# Patient Record
Sex: Female | Born: 1995 | Race: White | Hispanic: No | Marital: Married | Smoking: Current every day smoker
Health system: Southern US, Community
[De-identification: ages and names within clinical notes are randomized; demographics above are authoritative.]

## PROBLEM LIST (undated history)

## (undated) ENCOUNTER — Inpatient Hospital Stay (HOSPITAL_COMMUNITY): Payer: Self-pay

## (undated) ENCOUNTER — Ambulatory Visit: Discharge status: Absent without leave | Payer: Self-pay

## (undated) DIAGNOSIS — R109 Unspecified abdominal pain: Secondary | ICD-10-CM

## (undated) DIAGNOSIS — M542 Cervicalgia: Secondary | ICD-10-CM

## (undated) DIAGNOSIS — F418 Other specified anxiety disorders: Secondary | ICD-10-CM

## (undated) DIAGNOSIS — R1110 Vomiting, unspecified: Secondary | ICD-10-CM

## (undated) DIAGNOSIS — H9191 Unspecified hearing loss, right ear: Secondary | ICD-10-CM

## (undated) DIAGNOSIS — F99 Mental disorder, not otherwise specified: Secondary | ICD-10-CM

## (undated) DIAGNOSIS — F419 Anxiety disorder, unspecified: Secondary | ICD-10-CM

## (undated) DIAGNOSIS — M1990 Unspecified osteoarthritis, unspecified site: Secondary | ICD-10-CM

## (undated) DIAGNOSIS — R112 Nausea with vomiting, unspecified: Secondary | ICD-10-CM

## (undated) DIAGNOSIS — R45851 Suicidal ideations: Secondary | ICD-10-CM

## (undated) DIAGNOSIS — D649 Anemia, unspecified: Secondary | ICD-10-CM

## (undated) DIAGNOSIS — F5081 Binge eating disorder: Secondary | ICD-10-CM

## (undated) DIAGNOSIS — F329 Major depressive disorder, single episode, unspecified: Secondary | ICD-10-CM

## (undated) DIAGNOSIS — J45909 Unspecified asthma, uncomplicated: Secondary | ICD-10-CM

## (undated) DIAGNOSIS — O26619 Liver and biliary tract disorders in pregnancy, unspecified trimester: Secondary | ICD-10-CM

## (undated) DIAGNOSIS — T8859XA Other complications of anesthesia, initial encounter: Secondary | ICD-10-CM

## (undated) DIAGNOSIS — N390 Urinary tract infection, site not specified: Secondary | ICD-10-CM

## (undated) DIAGNOSIS — T4145XA Adverse effect of unspecified anesthetic, initial encounter: Secondary | ICD-10-CM

## (undated) DIAGNOSIS — F988 Other specified behavioral and emotional disorders with onset usually occurring in childhood and adolescence: Secondary | ICD-10-CM

## (undated) DIAGNOSIS — O26649 Intrahepatic cholestasis of pregnancy, unspecified trimester: Secondary | ICD-10-CM

## (undated) DIAGNOSIS — R51 Headache: Secondary | ICD-10-CM

## (undated) DIAGNOSIS — I209 Angina pectoris, unspecified: Secondary | ICD-10-CM

## (undated) DIAGNOSIS — N83209 Unspecified ovarian cyst, unspecified side: Secondary | ICD-10-CM

## (undated) DIAGNOSIS — E611 Iron deficiency: Secondary | ICD-10-CM

## (undated) DIAGNOSIS — K5900 Constipation, unspecified: Secondary | ICD-10-CM

## (undated) DIAGNOSIS — H938X9 Other specified disorders of ear, unspecified ear: Secondary | ICD-10-CM

## (undated) DIAGNOSIS — R519 Headache, unspecified: Secondary | ICD-10-CM

## (undated) DIAGNOSIS — O24419 Gestational diabetes mellitus in pregnancy, unspecified control: Secondary | ICD-10-CM

## (undated) DIAGNOSIS — K831 Obstruction of bile duct: Secondary | ICD-10-CM

## (undated) DIAGNOSIS — B009 Herpesviral infection, unspecified: Secondary | ICD-10-CM

## (undated) DIAGNOSIS — G8929 Other chronic pain: Secondary | ICD-10-CM

## (undated) DIAGNOSIS — Z9889 Other specified postprocedural states: Secondary | ICD-10-CM

## (undated) HISTORY — DX: Other specified anxiety disorders: F41.8

## (undated) HISTORY — PX: MIDDLE EAR SURGERY: SHX713

## (undated) HISTORY — DX: Constipation, unspecified: K59.00

## (undated) HISTORY — DX: Adverse effect of unspecified anesthetic, initial encounter: T41.45XA

## (undated) HISTORY — DX: Intrahepatic cholestasis of pregnancy, unspecified trimester: O26.649

## (undated) HISTORY — DX: Vomiting, unspecified: R11.10

## (undated) HISTORY — PX: COCHLEAR IMPLANT: SUR684

## (undated) HISTORY — DX: Anxiety disorder, unspecified: F41.9

## (undated) HISTORY — DX: Obstruction of bile duct: O26.619

## (undated) HISTORY — PX: CHOLESTEATOMA EXCISION: SHX1345

## (undated) HISTORY — DX: Unspecified asthma, uncomplicated: J45.909

## (undated) HISTORY — PX: TYMPANOPLASTY: SHX33

## (undated) HISTORY — DX: Other complications of anesthesia, initial encounter: T88.59XA

## (undated) HISTORY — DX: Obstruction of bile duct: K83.1

## (undated) HISTORY — PX: TYMPANOSTOMY: SHX2586

## (undated) HISTORY — DX: Unspecified abdominal pain: R10.9

## (undated) HISTORY — DX: Nausea with vomiting, unspecified: R11.2

## (undated) HISTORY — DX: Other specified postprocedural states: Z98.890

---
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
## 2017-01-04 ENCOUNTER — Encounter: Payer: Self-pay | Admitting: Family Medicine

## 2017-01-05 ENCOUNTER — Encounter: Payer: Self-pay | Admitting: Pediatrics

## 2017-01-05 ENCOUNTER — Ambulatory Visit (INDEPENDENT_AMBULATORY_CARE_PROVIDER_SITE_OTHER): Payer: Medicaid Other | Admitting: Pediatrics

## 2017-01-05 VITALS — BP 120/80 | Temp 98.5°F | Wt 168.2 lb

## 2017-01-05 DIAGNOSIS — S93401A Sprain of unspecified ligament of right ankle, initial encounter: Secondary | ICD-10-CM

## 2017-01-05 MED ORDER — CRUTCHES-ALUMINUM MISC: 0 refills | Status: DC

## 2017-01-05 MED ORDER — UNABLE TO FIND: 0 refills | Status: DC

## 2017-01-05 NOTE — Progress Notes (Signed)
Chief Complaint  Patient presents with  . Ankle Injury    hospital followup. pt has severely sprained ankle per hosptial note. no relief "getting worse"    HPI Stacey Cook here for injured ankle -  She fell down the stairs 2 nights ago  Was seen at [facility] and dx'd with " severe sprain"  Xray reportedly neg per family but ankle was swollen. She did ice for a while has been using an ace wrap. , yesterday did run errands in the car, . She feels the ankle is more swollen and painful today.is becoming more bruised   History was provided by the . patient and mother.  Allergies  Allergen Reactions  . Keflex [Cephalexin] Other (See Comments)    Reaction:  Elevated liver enzymes   . Other Other (See Comments)    Pt is allergic to surgical glue.  Reaction:  Infection   . Adhesive [Tape] Rash    Current Outpatient Prescriptions on File Prior to Visit  Medication Sig Dispense Refill  . albuterol (PROVENTIL HFA;VENTOLIN HFA) 108 (90 Base) MCG/ACT inhaler Inhale 2 puffs into the lungs every 4 (four) hours as needed for wheezing or shortness of breath. 1 Inhaler 0  . albuterol (PROVENTIL) (2.5 MG/3ML) 0.083% nebulizer solution Take 3 mLs (2.5 mg total) by nebulization every 6 (six) hours as needed for wheezing or shortness of breath. 75 mL 12  . cyclobenzaprine (FLEXERIL) 10 MG tablet Take 1 tablet (10 mg total) by mouth 2 (two) times daily as needed for muscle spasms. 20 tablet 0  . escitalopram (LEXAPRO) 20 MG tablet Take 20 mg by mouth daily.    Marland Kitchen ibuprofen (ADVIL,MOTRIN) 600 MG tablet Take 1 tablet (600 mg total) by mouth every 6 (six) hours as needed. 30 tablet 0  . lisdexamfetamine (VYVANSE) 30 MG capsule Take 30 mg by mouth daily.    . norgestimate-ethinyl estradiol (ORTHO-CYCLEN,SPRINTEC,PREVIFEM) 0.25-35 MG-MCG tablet Take 1 tablet by mouth daily. (Patient not taking: Reported on 09/23/2016) 1 Package 11  . oxcarbazepine (TRILEPTAL) 600 MG tablet Take 600 mg by mouth 2 (two) times  daily.     No current facility-administered medications on file prior to visit.     Past Medical History:  Diagnosis Date  . Abdominal pain   . ADD (attention deficit disorder)   . Anxiety   . Asthma   . 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 Cervicalgia   . Chronic abdominal pain   . Constipation   . Depression   . Ear mass   . Headache   . HSV infection   . Suicidal intent   . UTI (lower urinary tract infection) 05/2014  . Vomiting    Past Surgical History:  Procedure Laterality Date  . CHOLESTEATOMA EXCISION    . DILATION AND EVACUATION N/A 09/11/2016   Procedure: DILATATION AND CURRATAGE  2ND TRIMESTER;  Surgeon: Jonnie Kind, MD;  Location: [facility];  Service: Gynecology;  Laterality: N/A;  . IMPLANTATION BONE ANCHORED HEARING AID Right 04/2013  . 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 TYMPANOPLASTY Left   . TYMPANOSTOMY      ROS:     Constitutional  Afebrile, normal appetite, normal activity.   Opthalmologic  no irritation or drainage.   ENT  no rhinorrhea or congestion , no sore throat, no ear pain. Respiratory  no cough , wheeze or chest pain.  Gastrointestinal  no nausea or vomiting,   Genitourinary  Voiding normally  Musculoskeletal  no complaints of pain, no injuries.   Dermatologic  no rashes or lesions    family history includes Asthma in her brother and maternal grandmother; Cancer in her other; Cholelithiasis in her mother; Diabetes in her maternal grandmother; Hyperlipidemia in her maternal grandmother; Hypertension in her maternal grandmother; Kidney disease in her mother; Seizures in her maternal grandmother and maternal uncle; Stroke in her maternal grandmother; Thyroid disease in her maternal grandmother; Ulcers in her paternal grandfather.  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 No height on file for this encounter. 97 %ile (Z= 1.89) based on CDC 2-20 Years BMI-for-age data using weight from 01/05/2017 and height from 12/14/2016.      Objective:         General alert in NAD  Derm   no rashes or lesions  Head Normocephalic, atraumatic                    Eyes Normal, no discharge  Ears:   TMs normal bilaterally  Nose:   patent normal mucosa, turbinates normal, no rhinorrhea  Oral cavity  moist mucous membranes, no lesions  Throat:   normal tonsils, without exudate or erythema  Neck supple FROM  Lymph:   no significant cervical adenopathy  Lungs:  clear with equal breath sounds bilaterally  Heart:   regular rate and rhythm, no murmur  Abdomen:  soft nontender no organomegaly or masses  GU:  deferred  back No deformity  Extremities:   moderate swelling and ecchymosis over rt lateral malleolus, exam limited as pt indicated extreme pain with the slightest touch  Neuro:  intact no focal defects         Assessment/plan    1. Sprain of right ankle, unspecified ligament, initial encounter Pt exhibits exquisite tenderness to palpation but does bear weight on her rt leg Crutches. Air cast - Ambulatory referral to Orthopedics    Follow up  prn

## 2017-01-05 NOTE — Patient Instructions (Signed)
Ankle Sprain  An ankle sprain is a stretch or tear in one of the tough tissues (ligaments) in your ankle.  Follow these instructions at home:   Rest your ankle.   Take over-the-counter and prescription medicines only as told by your doctor.   For 2-3 days, keep your ankle higher than the level of your heart (elevated) as much as possible.   If directed, put ice on the area:  ? Put ice in a plastic bag.  ? Place a towel between your skin and the bag.  ? Leave the ice on for 20 minutes, 2-3 times a day.   If you were given a brace:  ? Wear it as told.  ? Take it off to shower or bathe.  ? Try not to move your ankle much, but wiggle your toes from time to time. This helps to prevent swelling.   If you were given an elastic bandage (dressing):  ? Take it off when you shower or bathe.  ? Try not to move your ankle much, but wiggle your toes from time to time. This helps to prevent swelling.  ? Adjust the bandage to make it more comfortable if it feels too tight.  ? Loosen the bandage if you lose feeling in your foot, your foot tingles, or your foot gets cold and blue.   If you have crutches, use them as told by your doctor. Continue to use them until you can walk without feeling pain in your ankle.  Contact a doctor if:   Your bruises or swelling are quickly getting worse.   Your pain does not get better after you take medicine.  Get help right away if:   You cannot feel your toes or foot.   Your toes or your foot looks blue.   You have very bad pain that gets worse.  This information is not intended to replace advice given to you by your health care provider. Make sure you discuss any questions you have with your health care provider.  Document Released: 10/13/2007 Document Revised: 10/02/2015 Document Reviewed: 11/26/2014  Elsevier Interactive Patient Education  2018 [employer]

## 2017-01-06 ENCOUNTER — Telehealth: Payer: Self-pay | Admitting: Orthopedic Surgery

## 2017-01-06 NOTE — Telephone Encounter (Signed)
Stacey Cook called today and said she saw her PCP yesterday and they were to refer her for her ankle.  I asked her if she went to the ER and she said she did.  Upon further questioning, she told me that she went to [facility].  I told her that she would need to get those ER notes and her x-rays on a disc.  I then told her that Dr. Aline Brochure would not be in the office next week but that I would be happy to give her an appointment with Dr. Luna Glasgow on Tuesday.  She said she would have to check with her mother and call me back.    I will await her call.

## 2017-01-18 ENCOUNTER — Encounter: Payer: Self-pay | Admitting: Adult Health

## 2017-01-18 ENCOUNTER — Ambulatory Visit (INDEPENDENT_AMBULATORY_CARE_PROVIDER_SITE_OTHER): Payer: Medicaid Other | Admitting: Adult Health

## 2017-01-18 VITALS — BP 104/78 | HR 102 | Ht 59.0 in | Wt 168.0 lb

## 2017-01-18 DIAGNOSIS — Z319 Encounter for procreative management, unspecified: Secondary | ICD-10-CM

## 2017-01-18 DIAGNOSIS — Z8759 Personal history of other complications of pregnancy, childbirth and the puerperium: Secondary | ICD-10-CM | POA: Diagnosis not present

## 2017-01-18 DIAGNOSIS — N926 Irregular menstruation, unspecified: Secondary | ICD-10-CM

## 2017-01-18 DIAGNOSIS — Z3202 Encounter for pregnancy test, result negative: Secondary | ICD-10-CM | POA: Insufficient documentation

## 2017-01-18 LAB — POCT URINE PREGNANCY: PREG TEST UR: NEGATIVE

## 2017-01-18 MED ORDER — PRENATAL PLUS 27-1 MG PO TABS: 1.0000 | ORAL_TABLET | Freq: Every day | ORAL | 12 refills | Status: DC

## 2017-01-18 NOTE — Progress Notes (Signed)
Subjective:     Patient ID: Stacey Cook, female   DOB: July 08, 1995, 21 y.o.   MRN: [id number]  HPI Stacey Cook is a 21 year old white female, in for UPT.She had a shot period in August only  2 days then faint +HPT then had negative test about 2 weeks late.She had miscarriage at 55 weeks, May 5,2018.She wants to be pregnant again.   Review of [place] PMP in August short and had faint + then - HPT Reviewed past medical,surgical, social and family history. Reviewed medications and allergies.     Objective:   Physical Exam BP 104/78 (BP Location: Left Arm, Patient Position: Sitting, Cuff Size: Normal)   Pulse (!) 102   Ht 4\' 11"  (1.499 m)   Wt 168 lb (76.2 kg)   LMP 01/13/2017   Breastfeeding? No   BMI 33.93 kg/m UPT negative.Skin warm and dry.  Lungs: clear to ausculation bilaterally. Cardiovascular: regular rate and rhythm.    Assessment:     1. Patient desires pregnancy   2. History of miscarriage   3. Pregnancy examination or test, negative result       Plan:       Rx prenatal plus #30 take 1 daily with 11 refills Get progesterone level 9/27 Have sex every other day, day 7-24 of cycle, and pee before sex and lay there after sex

## 2017-01-29 LAB — PROGESTERONE: PROGESTERONE: 1.3 ng/mL

## 2017-01-31 ENCOUNTER — Telehealth: Payer: Self-pay | Admitting: *Deleted

## 2017-01-31 NOTE — Telephone Encounter (Signed)
Left message x 1. JSY 

## 2017-01-31 NOTE — Telephone Encounter (Signed)
Pt aware of progesterone level and advised to keep having regular sex and if she starts period, call for progesterone to be rechecked on day 21. Pt voiced understanding. [place]

## 2017-02-06 ENCOUNTER — Emergency Department (HOSPITAL_COMMUNITY): Payer: Medicaid Other

## 2017-02-06 ENCOUNTER — Emergency Department (HOSPITAL_COMMUNITY)
Admission: EM | Admit: 2017-02-06 | Discharge: 2017-02-07 | Disposition: A | Discharge status: Home / Self Care | Payer: Medicaid Other | Attending: Emergency Medicine | Admitting: Emergency Medicine

## 2017-02-06 ENCOUNTER — Encounter (HOSPITAL_COMMUNITY): Payer: Self-pay | Admitting: *Deleted

## 2017-02-06 DIAGNOSIS — J45909 Unspecified asthma, uncomplicated: Secondary | ICD-10-CM | POA: Diagnosis not present

## 2017-02-06 DIAGNOSIS — Z79899 Other long term (current) drug therapy: Secondary | ICD-10-CM | POA: Insufficient documentation

## 2017-02-06 DIAGNOSIS — R1031 Right lower quadrant pain: Secondary | ICD-10-CM | POA: Diagnosis present

## 2017-02-06 DIAGNOSIS — N739 Female pelvic inflammatory disease, unspecified: Secondary | ICD-10-CM

## 2017-02-06 DIAGNOSIS — F17210 Nicotine dependence, cigarettes, uncomplicated: Secondary | ICD-10-CM | POA: Insufficient documentation

## 2017-02-06 LAB — URINALYSIS, ROUTINE W REFLEX MICROSCOPIC
BILIRUBIN URINE: NEGATIVE
Glucose, UA: NEGATIVE mg/dL
HGB URINE DIPSTICK: NEGATIVE
Ketones, ur: NEGATIVE mg/dL
NITRITE: NEGATIVE
PROTEIN: NEGATIVE mg/dL
Specific Gravity, Urine: 1.021 (ref 1.005–1.030)
pH: 5 (ref 5.0–8.0)

## 2017-02-06 LAB — PREGNANCY, URINE: PREG TEST UR: NEGATIVE

## 2017-02-06 MED ORDER — FENTANYL CITRATE (PF) 100 MCG/2ML IJ SOLN
25.0000 ug | Freq: Once | INTRAMUSCULAR | Status: AC
  Administered 2017-02-06: 25 ug via INTRAVENOUS
  Filled 2017-02-06: qty 2

## 2017-02-06 MED ORDER — ONDANSETRON HCL 4 MG/2ML IJ SOLN
4.0000 mg | Freq: Once | INTRAMUSCULAR | Status: AC
  Administered 2017-02-06: 4 mg via INTRAVENOUS
  Filled 2017-02-06: qty 2

## 2017-02-06 MED ORDER — SODIUM CHLORIDE 0.9 % IV BOLUS (SEPSIS): 500.0000 mL | Freq: Once | INTRAVENOUS | Status: DC

## 2017-02-06 MED ORDER — SODIUM CHLORIDE 0.9 % IV BOLUS (SEPSIS)
1000.0000 mL | Freq: Once | INTRAVENOUS | Status: AC
  Administered 2017-02-06: 1000 mL via INTRAVENOUS

## 2017-02-06 NOTE — ED Triage Notes (Signed)
Pt c/o bilateral lower abd pain with nausea, headache and cough that started a few days ago with pain becoming worse today,

## 2017-02-06 NOTE — ED Notes (Signed)
ED Provider at bedside. 

## 2017-02-06 NOTE — ED Provider Notes (Signed)
[facility] Provider Note   CSN: [id number] Arrival date & time: 02/06/17  2146  Time seen 23:05 PM    History   Chief Complaint Chief Complaint  Patient presents with  . Abdominal Pain    HPI Stacey Cook is a 21 y.o. female.  HPI  Patient reports about 3 days ago she had sharp pain in her right lower quadrant that was constant however today it became intermittent. She states today it would last 5 minutes and wouldoccur about every 15 minutes but it was more intense. She states sitting up or walking makes the pain hurt more, laying flat makes it feel better. She has had nausea and it is not clear whether she was having vomiting because she states she doesn't vomit food she only vomits phlegm. She also describes a cough for the past week with green mucus production. She states the cou is mild. She has had dysuria and frequency and has started having some pain in her right flank. She denies diarrhea, chills, or fever. She states his pain is similar to what she had in December when she was admitted to a hospital in [place] when she had elevated liver function tests in the 400 range. At that time they thought possibly it was from Keflex or an infection. Patient is G1 P0 Ab1, miscarriage at 14 weeks that required a D&C for retained POC. She states in August her. The short only lasting 2 days and that her last normal period was September 6. [facility] and was told she was not pregnant.she is not using any birth control.  PCP McDonell, Kyra Manges, MD   Past Medical History:  Diagnosis Date  . Abdominal pain   . ADD (attention deficit disorder)   . Anxiety   . Asthma   . 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 Cervicalgia   . Chronic abdominal pain   . Constipation   . Depression   . Ear mass   . Headache   . HSV infection   . Suicidal intent   . UTI (lower urinary tract infection) 05/2014  . Vomiting     Patient  Active Problem List   Diagnosis Date Noted  . Pregnancy examination or test, negative result 01/18/2017  . History of miscarriage 01/18/2017  . Patient desires pregnancy 01/18/2017  . Vaginal bleeding in pregnancy, first trimester 08/09/2016  . GBS bacteriuria 08/07/2016  . Supervision of normal first pregnancy 08/05/2016  . Conductive hearing loss, unilat, unrestrict hearing contralateral side 12/10/2015  . Severe episode of recurrent major depressive disorder, without psychotic features ([place])   . High-risk sexual behavior   . 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 Asthma, mild intermittent 02/18/2015  . 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 Perforation of left tympanic membrane 06/19/2014  . Cervicalgia 12/20/2013  . Abdominal pain, chronic, epigastric 08/08/2013  . Acne 08/22/2012  . Syncope 08/16/2012  . Seasonal allergies 08/16/2012  . GAD (generalized anxiety disorder) 08/01/2012  . ADHD (attention deficit hyperactivity disorder), combined type 08/01/2012  . ODD (oppositional defiant disorder) 08/01/2012  . Excessive gas 05/29/2012  . Vasovagal syncope 02/28/2012  . Cholesteatoma of attic of right ear 02/15/2011    Past Surgical History:  Procedure Laterality Date  . CHOLESTEATOMA EXCISION    . DILATION AND EVACUATION N/A 09/11/2016   Procedure: DILATATION  AND CURRATAGE  2ND TRIMESTER;  Surgeon: Jonnie Kind, MD;  Location: [facility];  Service: Gynecology;  Laterality: N/A;  . IMPLANTATION BONE ANCHORED HEARING AID Right 04/2013  . 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 TYMPANOPLASTY Left   . TYMPANOSTOMY      OB History    Gravida Para Term Preterm AB Living   1 0 0 0 1 0   SAB TAB Ectopic Multiple Live Births   1 0 0 0 0       Home Medications    Prior to Admission medications   Medication Sig Start Date End Date  Taking? Authorizing Provider  albuterol (PROVENTIL) (2.5 MG/3ML) 0.083% nebulizer solution Take 3 mLs (2.5 mg total) by nebulization every 6 (six) hours as needed for wheezing or shortness of breath. 09/05/16   Virgel Manifold, MD  doxycycline (VIBRAMYCIN) 100 MG capsule Take 1 capsule (100 mg total) by mouth 2 (two) times daily. 02/07/17   Rolland Porter, MD  escitalopram (LEXAPRO) 20 MG tablet Take 20 mg by mouth daily.    [provider]  HYDROXYZINE HCL PO Take by mouth. Takes 1 during day prn and 1-2 at bedtime    [provider]  ibuprofen (ADVIL,MOTRIN) 600 MG tablet Take 1 tablet (600 mg total) by mouth every 6 (six) hours as needed. 12/14/16   Noemi Chapel, MD  lisdexamfetamine (VYVANSE) 30 MG capsule Take 30 mg by mouth daily.    [provider]  naproxen (NAPROSYN) 500 MG tablet Take 1 po BID with food prn pain 02/07/17   Rolland Porter, MD  ondansetron (ZOFRAN) 4 MG tablet Take 1 tablet (4 mg total) by mouth every 8 (eight) hours as needed for nausea or vomiting. 02/07/17   Rolland Porter, MD  oxcarbazepine (TRILEPTAL) 600 MG tablet Take 600 mg by mouth 2 (two) times daily.    [provider]  prenatal vitamin w/FE, FA (PRENATAL 1 + 1) 27-1 MG TABS tablet Take 1 tablet by mouth daily at 12 noon. 01/18/17   Estill Dooms, NP    Family History Family History  Problem Relation Age of Onset  . Cholelithiasis Mother   . Kidney disease Mother        stones  . Hypertension Maternal Grandmother   . Diabetes Maternal Grandmother   . Stroke Maternal Grandmother   . Seizures Maternal Grandmother   . Asthma Maternal Grandmother   . Hyperlipidemia Maternal Grandmother   . Thyroid disease Maternal Grandmother   . Asthma Brother   . Ulcers Paternal Grandfather   . Seizures Maternal Uncle   . Cancer Other        breast- great aunt  . Celiac disease Neg Hx     Social History Social History  Substance Use Topics  . Smoking status: Current Every Day Smoker     Packs/day: 0.50    Years: 1.00    Types: Cigarettes  . Smokeless tobacco: Never Used  . Alcohol use No  graduated from HS unemployed   Allergies   Keflex [cephalexin]; Other; and Adhesive [tape]   Review of Systems Review of Systems  All other systems reviewed and are negative.    Physical Exam Updated Vital Signs BP (!) 114/50   Pulse 86   Temp 98.5 F (36.9 C) (Oral)   Resp 20   Ht 4\' 11"  (1.499 m)   Wt 73.7 kg (162 lb 6 oz)   LMP 01/13/2017   SpO2 98%  BMI 32.80 kg/m   Vital signs normal    Physical Exam  Constitutional: She is oriented to person, place, and time. She appears well-developed and well-nourished.  Non-toxic appearance. She does not appear ill. No distress.  HENT:  Head: Normocephalic and atraumatic.  Right Ear: External ear normal.  Left Ear: External ear normal.  Nose: Nose normal. No mucosal edema or rhinorrhea.  Mouth/Throat: Oropharynx is clear and moist. Mucous membranes are dry. No dental abscesses or uvula swelling.  Eyes: Pupils are equal, round, and reactive to light. Conjunctivae and EOM are normal.  Neck: Normal range of motion and full passive range of motion without pain. Neck supple.  Cardiovascular: Normal rate, regular rhythm and normal heart sounds.  Exam reveals no gallop and no friction rub.   No murmur heard. Pulmonary/Chest: Effort normal and breath sounds normal. No respiratory distress. She has no wheezes. She has no rhonchi. She has no rales. She exhibits no tenderness and no crepitus.  Abdominal: Soft. Normal appearance and bowel sounds are normal. She exhibits no distension. There is no tenderness. There is guarding. There is no rebound.    Palpation of the left lower quadrant causes pain in the right lower quadrant, Rovsing sign. She is extremely tender in the right lower quadrant with some guarding. She is mildly tender in the right upper quadrant.  Genitourinary:  Genitourinary Comments: 1:45 AM patient has normal  external genitalia, she has a moderate amount of thick white/yellow vaginal discharge. Her uterus is tender to palpation and manipulation. She has mild tenderness over the right ovary, left ovary is nontender. Patient has refused testing for syphilis and HIV.  Musculoskeletal: Normal range of motion. She exhibits no edema or tenderness.       Back:  Moves all extremities well. Patient has some right CVA tenderness  Neurological: She is alert and oriented to person, place, and time. She has normal strength. No cranial nerve deficit.  Skin: Skin is warm, dry and intact. No rash noted. No erythema. No pallor.  Psychiatric: She has a normal mood and affect. Her speech is normal and behavior is normal. Her mood appears not anxious.  Nursing note and vitals reviewed.    ED Treatments / Results  Labs (all labs ordered are listed, but only abnormal results are displayed) Results for orders placed or performed during the hospital encounter of 02/06/17  Wet prep, genital  Result Value Ref Range   Yeast Wet Prep HPF POC NONE SEEN NONE SEEN   Trich, Wet Prep NONE SEEN NONE SEEN   Clue Cells Wet Prep HPF POC NONE SEEN NONE SEEN   WBC, Wet Prep HPF POC MANY (A) NONE SEEN   Sperm NONE SEEN   Urinalysis, Routine w reflex microscopic  Result Value Ref Range   Color, Urine YELLOW YELLOW   APPearance HAZY (A) CLEAR   Specific Gravity, Urine 1.021 1.005 - 1.030   pH 5.0 5.0 - 8.0   Glucose, UA NEGATIVE NEGATIVE mg/dL   Hgb urine dipstick NEGATIVE NEGATIVE   Bilirubin Urine NEGATIVE NEGATIVE   Ketones, ur NEGATIVE NEGATIVE mg/dL   Protein, ur NEGATIVE NEGATIVE mg/dL   Nitrite NEGATIVE NEGATIVE   Leukocytes, UA LARGE (A) NEGATIVE   RBC / HPF 0-5 0 - 5 RBC/hpf   WBC, UA 6-30 0 - 5 WBC/hpf   Bacteria, UA RARE (A) NONE SEEN   Squamous Epithelial / LPF 6-30 (A) NONE SEEN   Mucus PRESENT   Pregnancy, urine  Result Value Ref Range  Preg Test, Ur NEGATIVE NEGATIVE  Comprehensive metabolic panel    Result Value Ref Range   Sodium 140 135 - 145 mmol/L   Potassium 3.7 3.5 - 5.1 mmol/L   Chloride 109 101 - 111 mmol/L   CO2 24 22 - 32 mmol/L   Glucose, Bld 110 (H) 65 - 99 mg/dL   BUN 14 6 - 20 mg/dL   Creatinine, Ser 0.57 0.44 - 1.00 mg/dL   Calcium 9.2 8.9 - 10.3 mg/dL   Total Protein 7.2 6.5 - 8.1 g/dL   Albumin 4.0 3.5 - 5.0 g/dL   AST 20 15 - 41 U/L   ALT 20 14 - 54 U/L   Alkaline Phosphatase 95 38 - 126 U/L   Total Bilirubin 0.4 0.3 - 1.2 mg/dL   GFR calc non Af Amer >60 >60 mL/min   GFR calc Af Amer >60 >60 mL/min   Anion gap 7 5 - 15  Lipase, blood  Result Value Ref Range   Lipase 40 11 - 51 U/L  CBC with Differential  Result Value Ref Range   WBC 9.0 4.0 - 10.5 K/uL   RBC 4.37 3.87 - 5.11 MIL/uL   Hemoglobin 12.4 12.0 - 15.0 g/dL   HCT 37.0 36.0 - 46.0 %   MCV 84.7 78.0 - 100.0 fL   MCH 28.4 26.0 - 34.0 pg   MCHC 33.5 30.0 - 36.0 g/dL   RDW 13.9 11.5 - 15.5 %   Platelets 212 150 - 400 K/uL   Neutrophils Relative % 52 %   Neutro Abs 4.7 1.7 - 7.7 K/uL   Lymphocytes Relative 33 %   Lymphs Abs 3.0 0.7 - 4.0 K/uL   Monocytes Relative 9 %   Monocytes Absolute 0.8 0.1 - 1.0 K/uL   Eosinophils Relative 6 %   Eosinophils Absolute 0.5 0.0 - 0.7 K/uL   Basophils Relative 0 %   Basophils Absolute 0.0 0.0 - 0.1 K/uL   Laboratory interpretation all normal     EKG  EKG Interpretation None       Radiology Dg Chest 2 View  Result Date: 02/07/2017 CLINICAL DATA:  Cough and shortness of breath for 1 week. Bilateral lower abdominal pain, nausea, headache, and cough starting a few days ago. EXAM: CHEST  2 VIEW COMPARISON:  04/29/2016 FINDINGS: The heart size and mediastinal contours are within normal limits. Both lungs are clear. The visualized skeletal structures are unremarkable. IMPRESSION: No active cardiopulmonary disease. Electronically Signed   By: Lucienne Capers M.D.   On: 02/07/2017 00:39   Ct Renal Stone Study  Result Date: 02/07/2017 CLINICAL DATA:   Bilateral lower abdominal pain with nausea, headache and cough beginning a few days ago becoming worse today. EXAM: CT ABDOMEN AND PELVIS WITHOUT CONTRAST TECHNIQUE: Multidetector CT imaging of the abdomen and pelvis was performed following the standard protocol without IV contrast. COMPARISON:  11/17/2016 FINDINGS: Lower chest: Within normal. Hepatobiliary: Within normal. Pancreas: Within normal. Spleen: Within normal. Adrenals/Urinary Tract: Adrenal glands are unremarkable. Kidneys are normal, without renal calculi, focal lesion, or hydronephrosis. Bladder is unremarkable. Stomach/Bowel: Stomach and small bowel are within normal. Appendix is normal. Colon is unremarkable. Vascular/Lymphatic: Within normal. Reproductive: Within normal. Other: Tiny amount of free pelvic fluid likely physiologic. No focal inflammatory change. No abdominal wall hernia. Musculoskeletal: Within normal. IMPRESSION: No acute findings in the abdomen/pelvis. Electronically Signed   By: Marin Olp M.D.   On: 02/07/2017 00:33    Procedures Procedures (including critical care time)  Medications  Ordered in ED Medications  sodium chloride 0.9 % bolus 500 mL (not administered)  sodium chloride 0.9 % bolus 1,000 mL (1,000 mLs Intravenous New Bag/Given 02/06/17 2335)  ondansetron (ZOFRAN) injection 4 mg (4 mg Intravenous Given 02/06/17 2335)  fentaNYL (SUBLIMAZE) injection 25 mcg (25 mcg Intravenous Given 02/06/17 2335)  ketorolac (TORADOL) 30 MG/ML injection 30 mg (30 mg Intravenous Given 02/07/17 0027)  cefTRIAXone (ROCEPHIN) injection 250 mg (250 mg Intramuscular Given 02/07/17 0227)  lidocaine (PF) (XYLOCAINE) 1 % injection (0.9 mLs  Given 02/07/17 0227)     Initial Impression / Assessment and Plan / ED Course  I have reviewed the triage vital signs and the nursing notes.  Pertinent labs & imaging results that were available during my care of the patient were reviewed by me and considered in my medical decision making (see  chart for details).     At this point patient has some right lower quadrant pain, concern is for possible atypical appendicitis, ovarian cyst problems. Patient had IV inserted and was given IV fluids and IV pain and nausea medication. CT of the abdomen was ordered.  Patient was given the results of her CT scan and laboratory tests. We discussed doing pelvic exam and she is agreeable.  Patient was given Rocephin IM for possible PID, she was discharged home with doxycycline. She can take naproxen for pain and she was given Zofran for nausea. [facility] she's not improving over the next several days.  Final Clinical Impressions(s) / ED Diagnoses   Final diagnoses:  Pelvic inflammatory disease    New Prescriptions New Prescriptions   DOXYCYCLINE (VIBRAMYCIN) 100 MG CAPSULE    Take 1 capsule (100 mg total) by mouth 2 (two) times daily.   NAPROXEN (NAPROSYN) 500 MG TABLET    Take 1 po BID with food prn pain   ONDANSETRON (ZOFRAN) 4 MG TABLET    Take 1 tablet (4 mg total) by mouth every 8 (eight) hours as needed for nausea or vomiting.    Plan discharge  Rolland Porter, MD, Barbette Or, MD 02/07/17 [phone]

## 2017-02-07 LAB — CBC WITH DIFFERENTIAL/PLATELET
BASOS PCT: 0 %
Basophils Absolute: 0 10*3/uL (ref 0.0–0.1)
Eosinophils Absolute: 0.5 10*3/uL (ref 0.0–0.7)
Eosinophils Relative: 6 %
HEMATOCRIT: 37 % (ref 36.0–46.0)
HEMOGLOBIN: 12.4 g/dL (ref 12.0–15.0)
LYMPHS ABS: 3 10*3/uL (ref 0.7–4.0)
LYMPHS PCT: 33 %
MCH: 28.4 pg (ref 26.0–34.0)
MCHC: 33.5 g/dL (ref 30.0–36.0)
MCV: 84.7 fL (ref 78.0–100.0)
MONOS PCT: 9 %
Monocytes Absolute: 0.8 10*3/uL (ref 0.1–1.0)
NEUTROS ABS: 4.7 10*3/uL (ref 1.7–7.7)
NEUTROS PCT: 52 %
Platelets: 212 10*3/uL (ref 150–400)
RBC: 4.37 MIL/uL (ref 3.87–5.11)
RDW: 13.9 % (ref 11.5–15.5)
WBC: 9 10*3/uL (ref 4.0–10.5)

## 2017-02-07 LAB — WET PREP, GENITAL
CLUE CELLS WET PREP: NONE SEEN
Sperm: NONE SEEN
Trich, Wet Prep: NONE SEEN
YEAST WET PREP: NONE SEEN

## 2017-02-07 LAB — COMPREHENSIVE METABOLIC PANEL
ALBUMIN: 4 g/dL (ref 3.5–5.0)
ALK PHOS: 95 U/L (ref 38–126)
ALT: 20 U/L (ref 14–54)
ANION GAP: 7 (ref 5–15)
AST: 20 U/L (ref 15–41)
BILIRUBIN TOTAL: 0.4 mg/dL (ref 0.3–1.2)
BUN: 14 mg/dL (ref 6–20)
CO2: 24 mmol/L (ref 22–32)
Calcium: 9.2 mg/dL (ref 8.9–10.3)
Chloride: 109 mmol/L (ref 101–111)
Creatinine, Ser: 0.57 mg/dL (ref 0.44–1.00)
GFR calc non Af Amer: >60 mL/min (ref >60)
GLUCOSE: 110 mg/dL — AB (ref 65–99)
POTASSIUM: 3.7 mmol/L (ref 3.5–5.1)
SODIUM: 140 mmol/L (ref 135–145)
Total Protein: 7.2 g/dL (ref 6.5–8.1)

## 2017-02-07 LAB — LIPASE, BLOOD: Lipase: 40 U/L (ref 11–51)

## 2017-02-07 MED ORDER — NAPROXEN 500 MG PO TABS: ORAL_TABLET | ORAL | 0 refills | Status: DC

## 2017-02-07 MED ORDER — NAPROXEN 250 MG PO TABS
500.0000 mg | ORAL_TABLET | Freq: Once | ORAL | Status: AC
  Administered 2017-02-07: 500 mg via ORAL
  Filled 2017-02-07: qty 2

## 2017-02-07 MED ORDER — DOXYCYCLINE HYCLATE 100 MG PO CAPS: 100.0000 mg | ORAL_CAPSULE | Freq: Two times a day (BID) | ORAL | 0 refills | Status: DC

## 2017-02-07 MED ORDER — CEFTRIAXONE SODIUM 250 MG IJ SOLR
250.0000 mg | Freq: Once | INTRAMUSCULAR | Status: AC
  Administered 2017-02-07: 250 mg via INTRAMUSCULAR
  Filled 2017-02-07: qty 250

## 2017-02-07 MED ORDER — LIDOCAINE HCL (PF) 1 % IJ SOLN
INTRAMUSCULAR | Status: AC
  Administered 2017-02-07: 0.9 mL
  Filled 2017-02-07: qty 2

## 2017-02-07 MED ORDER — KETOROLAC TROMETHAMINE 30 MG/ML IJ SOLN
30.0000 mg | Freq: Once | INTRAMUSCULAR | Status: AC
  Administered 2017-02-07: 30 mg via INTRAVENOUS
  Filled 2017-02-07: qty 1

## 2017-02-07 MED ORDER — ONDANSETRON HCL 4 MG PO TABS: 4.0000 mg | ORAL_TABLET | Freq: Three times a day (TID) | ORAL | 0 refills | Status: DC | PRN

## 2017-02-07 NOTE — ED Notes (Signed)
22 gauge iv removed from right ac, pt tolerated well, site wnl

## 2017-02-07 NOTE — Discharge Instructions (Signed)
Take the antibiotic until gone. Take the naproxen for pain and the zofran for nausea. Follow up with [facility] if you aren't improving over the next several days.

## 2017-02-07 NOTE — ED Notes (Signed)
Pt updated, states that her headache is returning,

## 2017-02-07 NOTE — ED Notes (Signed)
ED Provider at bedside. 

## 2017-02-07 NOTE — ED Notes (Signed)
Pt requesting something for a headache,

## 2017-02-08 LAB — GC/CHLAMYDIA PROBE AMP (~~LOC~~) NOT AT ARMC
Chlamydia: NEGATIVE
NEISSERIA GONORRHEA: NEGATIVE

## 2017-02-23 ENCOUNTER — Ambulatory Visit (INDEPENDENT_AMBULATORY_CARE_PROVIDER_SITE_OTHER): Payer: Medicaid Other | Admitting: Pediatrics

## 2017-02-23 DIAGNOSIS — Z23 Encounter for immunization: Secondary | ICD-10-CM

## 2017-02-23 NOTE — Progress Notes (Signed)
Vaccine only visit  

## 2017-03-02 ENCOUNTER — Ambulatory Visit (INDEPENDENT_AMBULATORY_CARE_PROVIDER_SITE_OTHER): Payer: Medicaid Other | Admitting: Orthopaedic Surgery

## 2017-03-02 ENCOUNTER — Encounter: Payer: Self-pay | Admitting: Orthopaedic Surgery

## 2017-03-02 ENCOUNTER — Ambulatory Visit (INDEPENDENT_AMBULATORY_CARE_PROVIDER_SITE_OTHER): Payer: Medicaid Other

## 2017-03-02 ENCOUNTER — Ambulatory Visit: Payer: Medicaid Other | Admitting: Adult Health

## 2017-03-02 VITALS — BP 121/59 | HR 78 | Temp 97.2°F | Ht 59.0 in | Wt 170.0 lb

## 2017-03-02 DIAGNOSIS — M25571 Pain in right ankle and joints of right foot: Secondary | ICD-10-CM

## 2017-03-02 DIAGNOSIS — F17210 Nicotine dependence, cigarettes, uncomplicated: Secondary | ICD-10-CM | POA: Diagnosis not present

## 2017-03-02 MED ORDER — NAPROXEN 500 MG PO TABS: 500.0000 mg | ORAL_TABLET | Freq: Two times a day (BID) | ORAL | 5 refills | Status: DC

## 2017-03-02 NOTE — Patient Instructions (Signed)

## 2017-03-02 NOTE — Progress Notes (Signed)
Subjective:    Patient ID: Stacey Cook, female    DOB: 28-Apr-1996, 21 y.o.   MRN: [id number]  HPI She hurt her right ankle in August of this year.  She was seen at [facility] for this injury.  X-rays were negative for fracture but she had significant lateral swelling of the ankle. She was given Ace wrap and crutches.  She stayed on the crutches three to four weeks.  She still has lateral pain of the ankle and pain posteriorly. She has no new trauma. She has no redness.  She is taking ibuprofen 600 a day with slight help.  She has no numbness.  She still has a slight limp.  She is concerned her ankle still hurts.   Review of Systems  Respiratory: Positive for shortness of breath.   Cardiovascular: Negative for leg swelling.  Musculoskeletal: Positive for arthralgias, gait problem and joint swelling.  Neurological: Positive for headaches.  Psychiatric/Behavioral: The patient is nervous/anxious.   All other systems reviewed and are negative.  Past Medical History:  Diagnosis Date  . Abdominal pain   . ADD (attention deficit disorder)   . Anxiety   . Asthma   . 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 Cervicalgia   . Chronic abdominal pain   . Constipation   . Depression   . Ear mass   . Headache   . HSV infection   . Suicidal intent   . UTI (lower urinary tract infection) 05/2014  . Vomiting     Past Surgical History:  Procedure Laterality Date  . CHOLESTEATOMA EXCISION    . DILATION AND EVACUATION N/A 09/11/2016   Procedure: DILATATION AND CURRATAGE  2ND TRIMESTER;  Surgeon: Jonnie Kind, MD;  Location: [facility];  Service: Gynecology;  Laterality: N/A;  . IMPLANTATION BONE ANCHORED HEARING AID Right 04/2013  . 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 TYMPANOPLASTY Left   . TYMPANOSTOMY      Current Outpatient Prescriptions on File Prior to Visit  Medication Sig Dispense Refill  . albuterol (PROVENTIL) (2.5 MG/3ML)  0.083% nebulizer solution Take 3 mLs (2.5 mg total) by nebulization every 6 (six) hours as needed for wheezing or shortness of breath. 75 mL 12  . escitalopram (LEXAPRO) 20 MG tablet Take 20 mg by mouth daily.    Marland Kitchen HYDROXYZINE HCL PO Take by mouth. Takes 1 during day prn and 1-2 at bedtime    . lisdexamfetamine (VYVANSE) 30 MG capsule Take 30 mg by mouth daily.    . ondansetron (ZOFRAN) 4 MG tablet Take 1 tablet (4 mg total) by mouth every 8 (eight) hours as needed for nausea or vomiting. 12 tablet 0  . oxcarbazepine (TRILEPTAL) 600 MG tablet Take 600 mg by mouth 2 (two) times daily.    . prenatal vitamin w/FE, FA (PRENATAL 1 + 1) 27-1 MG TABS tablet Take 1 tablet by mouth daily at 12 noon. 30 each 12  . doxycycline (VIBRAMYCIN) 100 MG capsule Take 1 capsule (100 mg total) by mouth 2 (two) times daily. (Patient not taking: Reported on 03/02/2017) 20 capsule 0   No current facility-administered medications on file prior to visit.     Social History   Social History  . Marital status: Single    Spouse name: N/A  . Number of children: N/A  . Years of education: N/A   Occupational History  . Not on file.   Social History Main Topics  .  Smoking status: Current Every Day Smoker    Packs/day: 0.50    Years: 1.00    Types: Cigarettes  . Smokeless tobacco: Never Used  . Alcohol use No  . Drug use: No  . Sexual activity: Yes    Birth control/ protection: None   Other Topics Concern  . Not on file   Social History Narrative   [employer]   Is smoker herself                Family History  Problem Relation Age of Onset  . Cholelithiasis Mother   . Kidney disease Mother        stones  . Hypertension Maternal Grandmother   . Diabetes Maternal Grandmother   . Stroke Maternal Grandmother   . Seizures Maternal Grandmother   . Asthma Maternal Grandmother   . Hyperlipidemia Maternal Grandmother   . Thyroid disease Maternal Grandmother   . Asthma Brother   . Ulcers  Paternal Grandfather   . Seizures Maternal Uncle   . Cancer Other        breast- great aunt  . Celiac disease Neg Hx     BP (!) 121/59   Pulse 78   Temp (!) 97.2 F (36.2 C)   Ht 4\' 11"  (1.499 m)   Wt 170 lb (77.1 kg)   LMP 02/10/2017   BMI 34.34 kg/m      Objective:   Physical Exam  Constitutional: She is oriented to person, place, and time. She appears well-developed and well-nourished.  HENT:  Head: Normocephalic and atraumatic.  Eyes: Pupils are equal, round, and reactive to light. Conjunctivae and EOM are normal.  Neck: Normal range of motion. Neck supple.  Cardiovascular: Normal rate, regular rhythm and intact distal pulses.   Pulmonary/Chest: Effort normal.  Abdominal: Soft.  Musculoskeletal: She exhibits tenderness (She has slight tenderness of the right anterior talofibular ligament, ROM of her right ankle is full, no limp, NV intact, no swelling.  Left ankle negative.).  Neurological: She is alert and oriented to person, place, and time. She displays normal reflexes. No cranial nerve deficit. She exhibits normal muscle tone. Coordination normal.  Skin: Skin is warm and dry.  Psychiatric: She has a normal mood and affect. Her behavior is normal. Judgment and thought content normal.  Vitals reviewed.   X-rays were done of the right ankle, reported separately.      Assessment & Plan:   Encounter Diagnoses  Name Primary?  . Pain in joint involving right ankle and foot Yes  . Cigarette nicotine dependence without complication    She smokes and I have told her to cut back and stop.  She is willing to do so.  I told her to stop the ibuprofen.  I will begin Naprosyn 500 po bid pc.  Return in three weeks.  If not improved, consider MRI.  Obtain better support shoes.  Call if any problem.  Precautions discussed.   Electronically Signed Sanjuana Kava, MD 10/24/20182:51 PM

## 2017-03-04 ENCOUNTER — Ambulatory Visit: Payer: Medicaid Other | Admitting: Adult Health

## 2017-03-11 ENCOUNTER — Encounter: Payer: Self-pay | Admitting: Adult Health

## 2017-03-11 ENCOUNTER — Ambulatory Visit (INDEPENDENT_AMBULATORY_CARE_PROVIDER_SITE_OTHER): Payer: Medicaid Other | Admitting: Adult Health

## 2017-03-11 VITALS — BP 102/80 | HR 110 | Ht 59.0 in | Wt 172.5 lb

## 2017-03-11 DIAGNOSIS — Z3201 Encounter for pregnancy test, result positive: Secondary | ICD-10-CM | POA: Diagnosis not present

## 2017-03-11 DIAGNOSIS — F17200 Nicotine dependence, unspecified, uncomplicated: Secondary | ICD-10-CM | POA: Diagnosis not present

## 2017-03-11 DIAGNOSIS — N926 Irregular menstruation, unspecified: Secondary | ICD-10-CM | POA: Diagnosis not present

## 2017-03-11 DIAGNOSIS — Z8759 Personal history of other complications of pregnancy, childbirth and the puerperium: Secondary | ICD-10-CM | POA: Diagnosis not present

## 2017-03-11 DIAGNOSIS — Z3A01 Less than 8 weeks gestation of pregnancy: Secondary | ICD-10-CM

## 2017-03-11 DIAGNOSIS — O3680X0 Pregnancy with inconclusive fetal viability, not applicable or unspecified: Secondary | ICD-10-CM

## 2017-03-11 LAB — POCT URINE PREGNANCY: Preg Test, Ur: POSITIVE — AB

## 2017-03-11 NOTE — Patient Instructions (Signed)
First Trimester of Pregnancy The first trimester of pregnancy is from week 1 until the end of week 13 (months 1 through 3). A week after a sperm fertilizes an egg, the egg will implant on the wall of the uterus. This embryo will begin to develop into a baby. Genes from you and your partner will form the baby. The female genes will determine whether the baby will be a boy or a girl. At 6-8 weeks, the eyes and face will be formed, and the heartbeat can be seen on ultrasound. At the end of 12 weeks, all the baby's organs will be formed. Now that you are pregnant, you will want to do everything you can to have a healthy baby. Two of the most important things are to get good prenatal care and to follow your health care provider's instructions. Prenatal care is all the medical care you receive before the baby's birth. This care will help prevent, find, and treat any problems during the pregnancy and childbirth. Body changes during your first trimester Your body goes through many changes during pregnancy. The changes vary from woman to woman.  You may gain or lose a couple of pounds at first.  You may feel sick to your stomach (nauseous) and you may throw up (vomit). If the vomiting is uncontrollable, call your health care provider.  You may tire easily.  You may develop headaches that can be relieved by medicines. All medicines should be approved by your health care provider.  You may urinate more often. Painful urination may mean you have a bladder infection.  You may develop heartburn as a result of your pregnancy.  You may develop constipation because certain hormones are causing the muscles that push stool through your intestines to slow down.  You may develop hemorrhoids or swollen veins (varicose veins).  Your breasts may begin to grow larger and become tender. Your nipples may stick out more, and the tissue that surrounds them (areola) may become darker.  Your gums may bleed and may be  sensitive to brushing and flossing.  Dark spots or blotches (chloasma, mask of pregnancy) may develop on your face. This will likely fade after the baby is born.  Your menstrual periods will stop.  You may have a loss of appetite.  You may develop cravings for certain kinds of food.  You may have changes in your emotions from day to day, such as being excited to be pregnant or being concerned that something may go wrong with the pregnancy and baby.  You may have more vivid and strange dreams.  You may have changes in your hair. These can include thickening of your hair, rapid growth, and changes in texture. Some women also have hair loss during or after pregnancy, or hair that feels dry or thin. Your hair will most likely return to normal after your baby is born.  What to expect at prenatal visits During a routine prenatal visit:  You will be weighed to make sure you and the baby are growing normally.  Your blood pressure will be taken.  Your abdomen will be measured to track your baby's growth.  The fetal heartbeat will be listened to between weeks 10 and 14 of your pregnancy.  Test results from any previous visits will be discussed.  Your health care provider may ask you:  How you are feeling.  If you are feeling the baby move.  If you have had any abnormal symptoms, such as leaking fluid, bleeding, severe headaches,   or abdominal cramping.  If you are using any tobacco products, including cigarettes, chewing tobacco, and electronic cigarettes.  If you have any questions.  Other tests that may be performed during your first trimester include:  Blood tests to find your blood type and to check for the presence of any previous infections. The tests will also be used to check for low iron levels (anemia) and protein on red blood cells (Rh antibodies). Depending on your risk factors, or if you previously had diabetes during pregnancy, you may have tests to check for high blood  sugar that affects pregnant women (gestational diabetes).  Urine tests to check for infections, diabetes, or protein in the urine.  An ultrasound to confirm the proper growth and development of the baby.  Fetal screens for spinal cord problems (spina bifida) and Down syndrome.  HIV (human immunodeficiency virus) testing. Routine prenatal testing includes screening for HIV, unless you choose not to have this test.  You may need other tests to make sure you and the baby are doing well.  Follow these instructions at home: Medicines  Follow your health care provider's instructions regarding medicine use. Specific medicines may be either safe or unsafe to take during pregnancy.  Take a prenatal vitamin that contains at least 600 micrograms (mcg) of folic acid.  If you develop constipation, try taking a stool softener if your health care provider approves. Eating and drinking  Eat a balanced diet that includes fresh fruits and vegetables, whole grains, good sources of protein such as meat, eggs, or tofu, and low-fat dairy. Your health care provider will help you determine the amount of weight gain that is right for you.  Avoid raw meat and uncooked cheese. These carry germs that can cause birth defects in the baby.  Eating four or five small meals rather than three large meals a day may help relieve nausea and vomiting. If you start to feel nauseous, eating a few soda crackers can be helpful. Drinking liquids between meals, instead of during meals, also seems to help ease nausea and vomiting.  Limit foods that are high in fat and processed sugars, such as fried and sweet foods.  To prevent constipation: ? Eat foods that are high in fiber, such as fresh fruits and vegetables, whole grains, and beans. ? Drink enough fluid to keep your urine clear or pale yellow. Activity  Exercise only as directed by your health care provider. Most women can continue their usual exercise routine during  pregnancy. Try to exercise for 30 minutes at least 5 days a week. Exercising will help you: ? Control your weight. ? Stay in shape. ? Be prepared for labor and delivery.  Experiencing pain or cramping in the lower abdomen or lower back is a good sign that you should stop exercising. Check with your health care provider before continuing with normal exercises.  Try to avoid standing for long periods of time. Move your legs often if you must stand in one place for a long time.  Avoid heavy lifting.  Wear low-heeled shoes and practice good posture.  You may continue to have sex unless your health care provider tells you not to. Relieving pain and discomfort  Wear a good support bra to relieve breast tenderness.  Take warm sitz baths to soothe any pain or discomfort caused by hemorrhoids. Use hemorrhoid cream if your health care provider approves.  Rest with your legs elevated if you have leg cramps or low back pain.  If you develop   varicose veins in your legs, wear support hose. Elevate your feet for 15 minutes, 3-4 times a day. Limit salt in your diet. Prenatal care  Schedule your prenatal visits by the twelfth week of pregnancy. They are usually scheduled monthly at first, then more often in the last 2 months before delivery.  Write down your questions. Take them to your prenatal visits.  Keep all your prenatal visits as told by your health care provider. This is important. Safety  Wear your seat belt at all times when driving.  Make a list of emergency phone numbers, including numbers for family, friends, the hospital, and police and fire departments. General instructions  Ask your health care provider for a referral to a local prenatal education class. Begin classes no later than the beginning of month 6 of your pregnancy.  Ask for help if you have counseling or nutritional needs during pregnancy. Your health care provider can offer advice or refer you to specialists for help  with various needs.  Do not use hot tubs, steam rooms, or saunas.  Do not douche or use tampons or scented sanitary pads.  Do not cross your legs for long periods of time.  Avoid cat litter boxes and soil used by cats. These carry germs that can cause birth defects in the baby and possibly loss of the fetus by miscarriage or stillbirth.  Avoid all smoking, herbs, alcohol, and medicines not prescribed by your health care provider. Chemicals in these products affect the formation and growth of the baby.  Do not use any products that contain nicotine or tobacco, such as cigarettes and e-cigarettes. If you need help quitting, ask your health care provider. You may receive counseling support and other resources to help you quit.  Schedule a dentist appointment. At home, brush your teeth with a soft toothbrush and be gentle when you floss. Contact a health care provider if:  You have dizziness.  You have mild pelvic cramps, pelvic pressure, or nagging pain in the abdominal area.  You have persistent nausea, vomiting, or diarrhea.  You have a bad smelling vaginal discharge.  You have pain when you urinate.  You notice increased swelling in your face, hands, legs, or ankles.  You are exposed to fifth disease or chickenpox.  You are exposed to German measles (rubella) and have never had it. Get help right away if:  You have a fever.  You are leaking fluid from your vagina.  You have spotting or bleeding from your vagina.  You have severe abdominal cramping or pain.  You have rapid weight gain or loss.  You vomit blood or material that looks like coffee grounds.  You develop a severe headache.  You have shortness of breath.  You have any kind of trauma, such as from a fall or a car accident. Summary  The first trimester of pregnancy is from week 1 until the end of week 13 (months 1 through 3).  Your body goes through many changes during pregnancy. The changes vary from  woman to woman.  You will have routine prenatal visits. During those visits, your health care provider will examine you, discuss any test results you may have, and talk with you about how you are feeling. This information is not intended to replace advice given to you by your health care provider. Make sure you discuss any questions you have with your health care provider. Document Released: 04/20/2001 Document Revised: 04/07/2016 Document Reviewed: 04/07/2016 [employer] Interactive Patient Education  2017 [employer]  

## 2017-03-11 NOTE — Progress Notes (Signed)
Subjective:     Patient ID: Stacey Cook, female   DOB: [birth year], 21 y.o.   MRN: [id number]  HPI Stacey Cook is a 21 year old, white female, in for UPT, has missed a period. She had miscarriage in May at 13 weeks and had D&C. And wants to be pregnant.   Review of Systems +missed period +heartburn Reviewed past medical,surgical, social and family history. Reviewed medications and allergies.     Objective:   Physical Exam BP 102/80 (BP Location: Left Arm, Patient Position: Sitting, Cuff Size: Normal)   Pulse (!) 110   Ht 4\' 11"  (1.499 m)   Wt 172 lb 8 oz (78.2 kg)   LMP 02/10/2017   BMI 34.84 kg/m UPT +, about 4+2 weeks by LMP with EDD 11/17/17.Skin warm and dry. Neck: mid line trachea, normal thyroid, good ROM, no lymphadenopathy noted. Lungs: clear to ausculation bilaterally. Cardiovascular: regular rate and rhythm.Abdomen is soft and non tender.    Assessment:     1. Pregnancy examination or test, positive result   2. Less than [redacted] weeks gestation of pregnancy   3. Encounter to determine fetal viability of pregnancy, single or unspecified fetus   4. Smoker       Plan:     Check QHCG and progesterone Dating [name] in 2 weeks Take PNV Review handout on first trimester and by [facility]    Stop naprosyn.

## 2017-03-12 LAB — BETA HCG QUANT (REF LAB): hCG Quant: 400 m[IU]/mL

## 2017-03-12 LAB — PROGESTERONE: Progesterone: 14.4 ng/mL

## 2017-03-19 ENCOUNTER — Emergency Department (HOSPITAL_COMMUNITY)
Admission: EM | Admit: 2017-03-19 | Discharge: 2017-03-19 | Disposition: A | Discharge status: Home / Self Care | Payer: Medicaid Other | Attending: Emergency Medicine | Admitting: Emergency Medicine

## 2017-03-19 ENCOUNTER — Other Ambulatory Visit: Payer: Self-pay

## 2017-03-19 ENCOUNTER — Emergency Department (HOSPITAL_COMMUNITY): Payer: Medicaid Other

## 2017-03-19 ENCOUNTER — Encounter (HOSPITAL_COMMUNITY): Payer: Self-pay | Admitting: Emergency Medicine

## 2017-03-19 DIAGNOSIS — R1032 Left lower quadrant pain: Secondary | ICD-10-CM | POA: Diagnosis not present

## 2017-03-19 DIAGNOSIS — Z3A00 Weeks of gestation of pregnancy not specified: Secondary | ICD-10-CM | POA: Insufficient documentation

## 2017-03-19 DIAGNOSIS — Z3490 Encounter for supervision of normal pregnancy, unspecified, unspecified trimester: Secondary | ICD-10-CM

## 2017-03-19 DIAGNOSIS — O9989 Other specified diseases and conditions complicating pregnancy, childbirth and the puerperium: Secondary | ICD-10-CM | POA: Insufficient documentation

## 2017-03-19 DIAGNOSIS — J45909 Unspecified asthma, uncomplicated: Secondary | ICD-10-CM | POA: Diagnosis not present

## 2017-03-19 DIAGNOSIS — J029 Acute pharyngitis, unspecified: Secondary | ICD-10-CM | POA: Diagnosis not present

## 2017-03-19 DIAGNOSIS — J020 Streptococcal pharyngitis: Secondary | ICD-10-CM | POA: Diagnosis not present

## 2017-03-19 DIAGNOSIS — R112 Nausea with vomiting, unspecified: Secondary | ICD-10-CM | POA: Insufficient documentation

## 2017-03-19 DIAGNOSIS — F17210 Nicotine dependence, cigarettes, uncomplicated: Secondary | ICD-10-CM | POA: Insufficient documentation

## 2017-03-19 LAB — URINALYSIS, ROUTINE W REFLEX MICROSCOPIC
BILIRUBIN URINE: NEGATIVE
GLUCOSE, UA: NEGATIVE mg/dL
HGB URINE DIPSTICK: NEGATIVE
KETONES UR: 5 mg/dL — AB
Leukocytes, UA: NEGATIVE
Nitrite: NEGATIVE
PH: 6 (ref 5.0–8.0)
Protein, ur: NEGATIVE mg/dL
Specific Gravity, Urine: 1.016 (ref 1.005–1.030)

## 2017-03-19 LAB — COMPREHENSIVE METABOLIC PANEL
ALK PHOS: 95 U/L (ref 38–126)
ALT: 33 U/L (ref 14–54)
AST: 25 U/L (ref 15–41)
Albumin: 4.3 g/dL (ref 3.5–5.0)
Anion gap: 10 (ref 5–15)
BILIRUBIN TOTAL: 0.6 mg/dL (ref 0.3–1.2)
BUN: 11 mg/dL (ref 6–20)
CALCIUM: 9.4 mg/dL (ref 8.9–10.3)
CO2: 21 mmol/L — AB (ref 22–32)
CREATININE: 0.63 mg/dL (ref 0.44–1.00)
Chloride: 100 mmol/L — ABNORMAL LOW (ref 101–111)
GFR calc non Af Amer: >60 mL/min (ref >60)
GLUCOSE: 100 mg/dL — AB (ref 65–99)
Potassium: 3.5 mmol/L (ref 3.5–5.1)
SODIUM: 131 mmol/L — AB (ref 135–145)
Total Protein: 8 g/dL (ref 6.5–8.1)

## 2017-03-19 LAB — CBC WITH DIFFERENTIAL/PLATELET
BASOS PCT: 0 %
Basophils Absolute: 0 10*3/uL (ref 0.0–0.1)
EOS ABS: 0 10*3/uL (ref 0.0–0.7)
EOS PCT: 0 %
HCT: 38.5 % (ref 36.0–46.0)
Hemoglobin: 13 g/dL (ref 12.0–15.0)
LYMPHS ABS: 0.7 10*3/uL (ref 0.7–4.0)
Lymphocytes Relative: 5 %
MCH: 29.1 pg (ref 26.0–34.0)
MCHC: 33.8 g/dL (ref 30.0–36.0)
MCV: 86.1 fL (ref 78.0–100.0)
MONOS PCT: 8 %
Monocytes Absolute: 1.2 10*3/uL — ABNORMAL HIGH (ref 0.1–1.0)
Neutro Abs: 13.7 10*3/uL — ABNORMAL HIGH (ref 1.7–7.7)
Neutrophils Relative %: 87 %
PLATELETS: 198 10*3/uL (ref 150–400)
RBC: 4.47 MIL/uL (ref 3.87–5.11)
RDW: 13.5 % (ref 11.5–15.5)
WBC: 15.6 10*3/uL — AB (ref 4.0–10.5)

## 2017-03-19 LAB — I-STAT BETA HCG BLOOD, ED (MC, WL, AP ONLY)

## 2017-03-19 LAB — LIPASE, BLOOD: Lipase: 22 U/L (ref 11–51)

## 2017-03-19 LAB — HCG, QUANTITATIVE, PREGNANCY: hCG, Beta Chain, Quant, S: 12889 m[IU]/mL — ABNORMAL HIGH (ref <5)

## 2017-03-19 LAB — RAPID STREP SCREEN (MED CTR MEBANE ONLY): Streptococcus, Group A Screen (Direct): POSITIVE — AB

## 2017-03-19 MED ORDER — SODIUM CHLORIDE 0.9 % IV BOLUS (SEPSIS)
1000.0000 mL | Freq: Once | INTRAVENOUS | Status: AC
  Administered 2017-03-19: 1000 mL via INTRAVENOUS

## 2017-03-19 MED ORDER — ONDANSETRON HCL 4 MG/2ML IJ SOLN
4.0000 mg | Freq: Once | INTRAMUSCULAR | Status: AC
  Administered 2017-03-19: 4 mg via INTRAVENOUS
  Filled 2017-03-19: qty 2

## 2017-03-19 MED ORDER — AMOXICILLIN 500 MG PO CAPS: 500.0000 mg | ORAL_CAPSULE | Freq: Two times a day (BID) | ORAL | 0 refills | Status: AC

## 2017-03-19 MED ORDER — AMOXICILLIN 250 MG PO CAPS
500.0000 mg | ORAL_CAPSULE | Freq: Once | ORAL | Status: AC
  Administered 2017-03-19: 500 mg via ORAL
  Filled 2017-03-19: qty 2

## 2017-03-19 MED ORDER — ACETAMINOPHEN 500 MG PO TABS
1000.0000 mg | ORAL_TABLET | Freq: Once | ORAL | Status: AC
  Administered 2017-03-19: 1000 mg via ORAL
  Filled 2017-03-19: qty 2

## 2017-03-19 MED ORDER — DOXYLAMINE-PYRIDOXINE 10-10 MG PO TBEC: 1.0000 | DELAYED_RELEASE_TABLET | Freq: Three times a day (TID) | ORAL | 0 refills | Status: AC | PRN

## 2017-03-19 NOTE — ED Triage Notes (Signed)
Pt is [redacted] weeks pregnant. C/o of sore throat, N/V, diarrhea, ABD pain starting last night. No vaginal bleeding noted. No urinary symptoms.

## 2017-03-19 NOTE — Discharge Instructions (Signed)
You have been seen in the Emergency Department (ED) for abdominal pain.  Your evaluation did not identify a clear cause of your symptoms but was generally reassuring.  You were found to have strep throat and we are starting you on antibiotics today. Take all 10 days of antibiotics. Follow up with your OB/Gyn in the coming week.   Please follow up as instructed above regarding today?s emergent visit and the symptoms that are bothering you.  Return to the ED if your abdominal pain worsens or fails to improve, you develop bloody vomiting, bloody diarrhea, you are unable to tolerate fluids due to vomiting, fever greater than 101, or other symptoms that concern you.  Condition  Safe Medications to Take During Pregnancy*  Allergy  Antihistamines including: Chlorpheniramine (Chlor-Trimeton, Efidac, Teldrin) Diphenhydramine (Benadryl) Loratadine (Alavert, Claritin, Loradamed, Tavist ND Allergy) Nasal spray oxymetazoline (Afrin, Neo-Synephrine) (Check with your doctor first.) Steroid nasal spray (Rhinocort) (Check with your doctor first.) Cold and Flu  Robitussin (check which ones, some should not be use din 1st trimester), Trind-DM, Vicks Cough Syrup Saline nasal drops or spray Actifed, Dristan, Neosynephrine*, Sudafed (Check with your doctor first. Do not use in first trimester.) Tylenol (acetaminophen) or Tylenol Cold Warm salt/water gargle *Do not take "SA" (sustained action) forms or "Multi-Symptom" forms of these drugs. Constipation  Citrucil Colace  Fiberall/Fibercon Metamucil  Milk of Magnesia  Senekot Diarrhea  For 24 hours, only after 12 weeks of pregnancy:   Imodium  Kaopectate  Parepectolin First Aid Ointment  Bacitracin  J & J Neosporin  Headache Tylenol (acetaminophen) Heartburn  Gaviscon Maalox  Mylanta  Riopan Titralac TUMs Hemorrhoids  Anusol  Preparation H  Tucks  Witch hazel Nausea and Vomiting  Emetrex Emetrol (if not diabetic) Sea bands Vitamin B6  (100 mg tablet) Rashes  Benadryl cream Caladryl lotion or cream Hydrocortisone cream or ointment Oatmeal bath (Aveeno) Yeast Infection  Monistat or Terazol Do not insert applicator too far  *Please Note: No drug can be considered 100% safe to use during pregnancy.

## 2017-03-19 NOTE — ED Provider Notes (Signed)
Emergency Department Provider Note   I have reviewed the triage vital signs and the nursing notes.   HISTORY  Chief Complaint Sore Throat   HPI Stacey Cook is a 21 y.o. female G2P0 currently 5 weeks by LMP sore throat with associated nausea, vomiting, pain.  Symptoms began last night and have gradually worsened.  She denies any vaginal bleeding or discharge.  Denies any dysuria, hesitancy, urgency.  No sick contacts.  She describes soreness in the throat and vomiting when drinking fluids or eating food.  Patient states that she went to [facility] last week at which time she had a positive pregnancy test.  She was attempting to establish a prescription for birth control at that time.  She was not having abdominal pain at that time.   Patient states that her abdominal discomfort is focal, nonradiating, with no modifying factors.  Her first pregnancy ended in miscarriage.    Past Medical History:  Diagnosis Date  . Abdominal pain   . ADD (attention deficit disorder)   . Anxiety   . Arthritis   . Asthma   . 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 Cervicalgia   . Chronic abdominal pain   . Constipation   . Depression   . Ear mass   . Headache   . HSV infection   . Suicidal intent   . UTI (lower urinary tract infection) 05/2014  . Vomiting     Patient Active Problem List   Diagnosis Date Noted  . Pregnancy examination or test, negative result 01/18/2017  . History of miscarriage 01/18/2017  . Patient desires pregnancy 01/18/2017  . Vaginal bleeding in pregnancy, first trimester 08/09/2016  . GBS bacteriuria 08/07/2016  . Supervision of normal first pregnancy 08/05/2016  . Conductive hearing loss, unilat, unrestrict hearing contralateral side 12/10/2015  . Severe episode of recurrent major depressive disorder, without psychotic features ([place])   . High-risk sexual behavior   . Binge-eating disorder, in partial remission, moderate 03/21/2015    . Asthma, mild intermittent 02/18/2015  . 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 Perforation of left tympanic membrane 06/19/2014  . Cervicalgia 12/20/2013  . Abdominal pain, chronic, epigastric 08/08/2013  . Acne 08/22/2012  . Syncope 08/16/2012  . Seasonal allergies 08/16/2012  . GAD (generalized anxiety disorder) 08/01/2012  . ADHD (attention deficit hyperactivity disorder), combined type 08/01/2012  . ODD (oppositional defiant disorder) 08/01/2012  . Excessive gas 05/29/2012  . Vasovagal syncope 02/28/2012  . Cholesteatoma of attic of right ear 02/15/2011    Past Surgical History:  Procedure Laterality Date  . CHOLESTEATOMA EXCISION    . IMPLANTATION BONE ANCHORED HEARING AID Right 04/2013  . 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 TYMPANOPLASTY Left   . TYMPANOSTOMY      Current Outpatient Rx  . Order #: [id number] Class: Print  . Order #: [id number] Class: Normal  . Order #: [id number] Class: Print  . Order #: [id number] Class: Print    Allergies Keflex [cephalexin]; Other; and Adhesive [tape]  Family History  Problem Relation Age of Onset  . Cholelithiasis Mother   . Kidney disease Mother        stones  . Depression Mother   . Hypertension Maternal Grandmother   . Diabetes Maternal Grandmother   . Stroke Maternal Grandmother   . Seizures Maternal Grandmother   . Asthma Maternal Grandmother   .  Hyperlipidemia Maternal Grandmother   . Thyroid disease Maternal Grandmother   . Asthma Brother   . Ulcers Paternal Grandfather   . Seizures Maternal Uncle   . Cancer Other        breast- great aunt  . Celiac disease Neg Hx     Social History Social History   Tobacco Use  . Smoking status: Current Every Day Smoker    Packs/day: 0.00    Years: 1.00    Pack years: 0.00    Types: Cigarettes  . Smokeless tobacco: Never Used  . Tobacco comment: smokes  10 cig daily  Substance Use Topics  . Alcohol use: No    Alcohol/week: 0.0 oz  . Drug use: No    Review of Systems  Constitutional: No fever/chills Eyes: No visual changes. ENT: Positive sore throat. Cardiovascular: Denies chest pain. Respiratory: Denies shortness of breath. Gastrointestinal: Positive LLQ abdominal pain. Positive nausea, vomiting, and diarrhea.  No constipation. Genitourinary: Negative for dysuria. Musculoskeletal: Negative for back pain. Skin: Negative for rash. Neurological: Negative for headaches, focal weakness or numbness.  10-point ROS otherwise negative.  ____________________________________________   PHYSICAL EXAM:  VITAL SIGNS: ED Triage Vitals  Enc Vitals Group     BP 03/19/17 1530 133/82     Pulse Rate 03/19/17 1530 85     Resp 03/19/17 1530 18     Temp 03/19/17 1530 98.3 F (36.8 C)     Temp Source 03/19/17 1530 Oral     SpO2 03/19/17 1530 99 %     Weight 03/19/17 1531 172 lb (78 kg)     Height 03/19/17 1531 4\' 11"  (1.499 m)   Constitutional: Alert and oriented. Well appearing and in no acute distress. Eyes: Conjunctivae are normal. PERRL. EOMI. Head: Atraumatic. Nose: No congestion/rhinnorhea. Mouth/Throat: Mucous membranes are moist.  Oropharynx is diffusely erythematous with no exudate or PTA. Managing oral secretions and speaking in a clear voice. Soft submandibular compartment.  Neck: No stridor.  No meningeal signs.   Cardiovascular: Normal rate, regular rhythm. Good peripheral circulation. Grossly normal heart sounds.   Respiratory: Normal respiratory effort.  No retractions. Lungs CTAB. Gastrointestinal: Soft with mild LLQ tenderness to palpation. No distention.  Musculoskeletal: No lower extremity tenderness nor edema. No gross deformities of extremities. Neurologic:  Normal speech and language. No gross focal neurologic deficits are appreciated.  Skin:  Skin is warm, dry and intact. No rash  noted.  ____________________________________________   LABS (all labs ordered are listed, but only abnormal results are displayed)  Labs Reviewed  RAPID STREP SCREEN (NOT AT [facility]) - Abnormal; Notable for the following components:      Result Value   Streptococcus, Group A Screen (Direct) POSITIVE (*)    All other components within normal limits  COMPREHENSIVE METABOLIC PANEL - Abnormal; Notable for the following components:   Sodium 131 (*)    Chloride 100 (*)    CO2 21 (*)    Glucose, Bld 100 (*)    All other components within normal limits  CBC WITH DIFFERENTIAL/PLATELET - Abnormal; Notable for the following components:   WBC 15.6 (*)    Neutro Abs 13.7 (*)    Monocytes Absolute 1.2 (*)    All other components within normal limits  URINALYSIS, ROUTINE W REFLEX MICROSCOPIC - Abnormal; Notable for the following components:   Ketones, ur 5 (*)    All other components within normal limits  HCG, QUANTITATIVE, PREGNANCY - Abnormal; Notable for the following components:   hCG, Beta Chain,  Quant, S 12,889 (*)    All other components within normal limits  I-STAT BETA HCG BLOOD, ED ([facility], [facility], [facility] ONLY) - Abnormal; Notable for the following components:   I-stat hCG, quantitative >2,000.0 (*)    All other components within normal limits  URINE CULTURE  LIPASE, BLOOD   ____________________________________________  RADIOLOGY  US Ob Comp < 14 Wks  Result Date: 03/19/2017 CLINICAL DATA:  Left lower quadrant pain. EXAM: OBSTETRIC <14 WK [name] AND TRANSVAGINAL OB US TECHNIQUE: Both transabdominal and transvaginal ultrasound examinations were performed for complete evaluation of the gestation as well as the maternal uterus, adnexal regions, and pelvic cul-de-sac. Transvaginal technique was performed to assess early pregnancy. COMPARISON:  None. FINDINGS: Intrauterine gestational sac: Single Yolk sac:  Visualized. Embryo:  Not Visualized. Heart Rate:   bpm MSD: 9.4  mm   5 w   5  d CRL:    mm    w     d                  [name] EDC: Subchorionic hemorrhage:  None visualized. Maternal uterus/adnexae: Dominant follicle in the right ovary. Normal left ovary. Small amount of fluid in the pelvis is likely physiologic. IMPRESSION: 1. Single live IUP with a gestational sac and yolk sac. No fetal pole seen at this time. Recommend close follow-up. 2. There is a dominant follicle in the right ovary. A small amount of fluid in the pelvis is likely physiologic. Electronically Signed   By: Dorise Bullion III M.D   On: 03/19/2017 19:44   US Ob Transvaginal  Result Date: 03/19/2017 CLINICAL DATA:  Left lower quadrant pain. EXAM: OBSTETRIC <14 WK [name] AND TRANSVAGINAL OB US TECHNIQUE: Both transabdominal and transvaginal ultrasound examinations were performed for complete evaluation of the gestation as well as the maternal uterus, adnexal regions, and pelvic cul-de-sac. Transvaginal technique was performed to assess early pregnancy. COMPARISON:  None. FINDINGS: Intrauterine gestational sac: Single Yolk sac:  Visualized. Embryo:  Not Visualized. Heart Rate:   bpm MSD: 9.4  mm   5 w   5  d CRL:    mm    w    d                  [name] EDC: Subchorionic hemorrhage:  None visualized. Maternal uterus/adnexae: Dominant follicle in the right ovary. Normal left ovary. Small amount of fluid in the pelvis is likely physiologic. IMPRESSION: 1. Single live IUP with a gestational sac and yolk sac. No fetal pole seen at this time. Recommend close follow-up. 2. There is a dominant follicle in the right ovary. A small amount of fluid in the pelvis is likely physiologic. Electronically Signed   By: Dorise Bullion III M.D   On: 03/19/2017 19:44    ____________________________________________   PROCEDURES  Procedure(s) performed:   Procedures  None ____________________________________________   INITIAL IMPRESSION / ASSESSMENT AND PLAN / ED COURSE  Pertinent labs & imaging results that were available during my care of the patient  were reviewed by me and considered in my medical decision making (see chart for details).  Patient presents to the emergency department for evaluation of sore throat, nausea, vomiting, diarrhea, left lower quadrant abdominal pain.  The patient is approximately [redacted] weeks pregnant based on her last menstrual period.  Her abdominal discomfort seem to go along with pharyngitis symptoms.  Possibly strep throat versus viral pharyngitis.  Lower suspicion for ectopic pregnancy but I am unable to confirm  an intrauterine pregnancy on bedside US.  This may be due to the early state of the patient's pregnancy.  Plan to establish IV access, labs including hCG, and send rapid strep.  08:01 PM Patient has intrauterine pregnancy on formal ultrasound.  Plan to start amoxicillin for strep pharyngitis.  Also prescribed Diclegis for nausea and vomiting.  She will follow with her OB/GYN in the coming week. Has a listed Keflex allergy with reaction listed as elevated liver enzymes. No indication and penicillin will do the same. Normal LFTs today.   At this time, I do not feel there is any life-threatening condition present. I have reviewed and discussed all results (EKG, imaging, lab, urine as appropriate), exam findings with patient. I have reviewed nursing notes and appropriate previous records.  I feel the patient is safe to be discharged home without further emergent workup. Discussed usual and customary return precautions. Patient and family (if present) verbalize understanding and are comfortable with this plan.  Patient will follow-up with their primary care provider. If they do not have a primary care provider, information for follow-up has been provided to them. All questions have been answered.  ____________________________________________  FINAL CLINICAL IMPRESSION(S) / ED DIAGNOSES  Final diagnoses:  Strep pharyngitis  Left lower quadrant abdominal pain of unknown etiology  Pregnancy, unspecified gestational  age     MEDICATIONS GIVEN DURING THIS VISIT:  Medications  amoxicillin (AMOXIL) capsule 500 mg (not administered)  sodium chloride 0.9 % bolus 1,000 mL (0 mLs Intravenous Stopped 03/19/17 1808)  ondansetron (ZOFRAN) injection 4 mg (4 mg Intravenous Given 03/19/17 1622)  acetaminophen (TYLENOL) tablet 1,000 mg (1,000 mg Oral Given 03/19/17 1808)    Note:  This document was prepared using Dragon voice recognition software and may include unintentional dictation errors.  Nanda Quinton, MD Emergency Medicine    Saatvik Thielman, [facility], MD 03/19/17 2003

## 2017-03-20 LAB — URINE CULTURE
Culture: <10000 — AB
Special Requests: NORMAL

## 2017-03-23 ENCOUNTER — Ambulatory Visit (INDEPENDENT_AMBULATORY_CARE_PROVIDER_SITE_OTHER): Payer: Medicaid Other

## 2017-03-23 ENCOUNTER — Other Ambulatory Visit: Payer: Self-pay | Admitting: Adult Health

## 2017-03-23 ENCOUNTER — Ambulatory Visit: Payer: Medicaid Other | Admitting: Orthopaedic Surgery

## 2017-03-23 DIAGNOSIS — O3680X0 Pregnancy with inconclusive fetal viability, not applicable or unspecified: Secondary | ICD-10-CM

## 2017-03-23 DIAGNOSIS — Z3A01 Less than 8 weeks gestation of pregnancy: Secondary | ICD-10-CM | POA: Diagnosis not present

## 2017-03-23 NOTE — Progress Notes (Signed)
[name] TA/TV: 5+6 wks,single IUP,positive FHT 87 bpm,normal ovaries bilat, crl 4.3 mm,EDD 11/17/2017 by LMP

## 2017-03-26 ENCOUNTER — Encounter (HOSPITAL_COMMUNITY): Payer: Self-pay | Admitting: Emergency Medicine

## 2017-03-26 ENCOUNTER — Emergency Department (HOSPITAL_COMMUNITY)
Admission: EM | Admit: 2017-03-26 | Discharge: 2017-03-26 | Disposition: A | Discharge status: Home / Self Care | Payer: Medicaid Other | Attending: Emergency Medicine | Admitting: Emergency Medicine

## 2017-03-26 DIAGNOSIS — F17210 Nicotine dependence, cigarettes, uncomplicated: Secondary | ICD-10-CM | POA: Insufficient documentation

## 2017-03-26 DIAGNOSIS — I889 Nonspecific lymphadenitis, unspecified: Secondary | ICD-10-CM | POA: Diagnosis not present

## 2017-03-26 DIAGNOSIS — J45909 Unspecified asthma, uncomplicated: Secondary | ICD-10-CM | POA: Insufficient documentation

## 2017-03-26 DIAGNOSIS — Z3A01 Less than 8 weeks gestation of pregnancy: Secondary | ICD-10-CM | POA: Diagnosis not present

## 2017-03-26 DIAGNOSIS — O9989 Other specified diseases and conditions complicating pregnancy, childbirth and the puerperium: Secondary | ICD-10-CM | POA: Insufficient documentation

## 2017-03-26 DIAGNOSIS — M542 Cervicalgia: Secondary | ICD-10-CM | POA: Insufficient documentation

## 2017-03-26 DIAGNOSIS — Z79899 Other long term (current) drug therapy: Secondary | ICD-10-CM | POA: Diagnosis not present

## 2017-03-26 DIAGNOSIS — J029 Acute pharyngitis, unspecified: Secondary | ICD-10-CM | POA: Diagnosis present

## 2017-03-26 NOTE — ED Triage Notes (Signed)
Pt reports being tx for strep throat and began having severe neck pain with inability to turn head without pain.  Tender to palpate glands.

## 2017-03-26 NOTE — Discharge Instructions (Signed)
Please take ibuprofen up to 400 mg 3 times a day to help with the pain in your neck ER for severe or worsening symptoms including stiffness fevers headache or vomiting  Your doctor should see you within the next 48 hours for recheck  Please obtain all of your results from medical records or have your doctors office obtain the results - share them with your doctor - you should be seen at your doctors office in the next 2 days. Call today to arrange your follow up. Take the medications as prescribed. Please review all of the medicines and only take them if you do not have an allergy to them. Please be aware that if you are taking birth control pills, taking other prescriptions, ESPECIALLY ANTIBIOTICS may make the birth control ineffective - if this is the case, either do not engage in sexual activity or use alternative methods of birth control such as condoms until you have finished the medicine and your family doctor says it is OK to restart them. If you are on a blood thinner such as COUMADIN, be aware that any other medicine that you take may cause the coumadin to either work too much, or not enough - you should have your coumadin level rechecked in next 7 days if this is the case.  ?  It is also a possibility that you have an allergic reaction to any of the medicines that you have been prescribed - Everybody reacts differently to medications and while MOST people have no trouble with most medicines, you may have a reaction such as nausea, vomiting, rash, swelling, shortness of breath. If this is the case, please stop taking the medicine immediately and contact your physician.  ?  You should return to the ER if you develop severe or worsening symptoms.

## 2017-03-26 NOTE — ED Notes (Signed)
Dr M in to assess  

## 2017-03-26 NOTE — ED Provider Notes (Signed)
[facility] EMERGENCY DEPARTMENT Provider Note   CSN: [id number] Arrival date & time: 03/26/17  5885     History   Chief Complaint Chief Complaint  Patient presents with  . Sore Throat    HPI Stacey Cook is a 21 y.o. female.  HPI  The patient is a 21 year old female, she was recently diagnosed with streptococcal pharyngitis by a rapid strep screen that was performed 7 days ago in the emergency department.  During that time the patient was complaining of a sore throat, she was given amoxicillin which she has been taking for the last week and is helping with her sore throat however she notes that she is having some tenderness in her neck and some stiffness that she feels like a crick in her neck.  She reports that she has been taking Tylenol without relief, she has not been having fevers and denies any abdominal pain or shortness of breath though she has been having occasional coughing.  At this neck discomfort has been present for 3 or 4 days and does not bother her to open and close her mouth.  The pain is worse with rotation not so much with extension or flexion of the neck.  Past Medical History:  Diagnosis Date  . Abdominal pain   . ADD (attention deficit disorder)   . Anxiety   . Arthritis   . Asthma   . 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 Cervicalgia   . Chronic abdominal pain   . Constipation   . Depression   . Ear mass   . Headache   . HSV infection   . Suicidal intent   . UTI (lower urinary tract infection) 05/2014  . Vomiting     Patient Active Problem List   Diagnosis Date Noted  . Pregnancy examination or test, negative result 01/18/2017  . History of miscarriage 01/18/2017  . Patient desires pregnancy 01/18/2017  . Vaginal bleeding in pregnancy, first trimester 08/09/2016  . GBS bacteriuria 08/07/2016  . Supervision of normal first pregnancy 08/05/2016  . Conductive hearing loss, unilat, unrestrict hearing contralateral  side 12/10/2015  . Severe episode of recurrent major depressive disorder, without psychotic features ([place])   . High-risk sexual behavior   . 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 Asthma, mild intermittent 02/18/2015  . 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 Perforation of left tympanic membrane 06/19/2014  . Cervicalgia 12/20/2013  . Abdominal pain, chronic, epigastric 08/08/2013  . Acne 08/22/2012  . Syncope 08/16/2012  . Seasonal allergies 08/16/2012  . GAD (generalized anxiety disorder) 08/01/2012  . ADHD (attention deficit hyperactivity disorder), combined type 08/01/2012  . ODD (oppositional defiant disorder) 08/01/2012  . Excessive gas 05/29/2012  . Vasovagal syncope 02/28/2012  . Cholesteatoma of attic of right ear 02/15/2011    Past Surgical History:  Procedure Laterality Date  . CHOLESTEATOMA EXCISION    . DILATATION AND CURRATAGE  2ND TRIMESTER N/A 09/11/2016   Performed by Jonnie Kind, MD at [facility]  . IMPLANTATION BONE ANCHORED HEARING AID Right 04/2013  . 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 TYMPANOPLASTY Left   . TYMPANOSTOMY      OB History    Gravida Para Term Preterm AB Living   2 0 0 0 1 0   SAB TAB Ectopic Multiple Live Births   1  0 0 0 0       Home Medications    Prior to Admission medications   Medication Sig Start Date End Date Taking? Authorizing Provider  albuterol (PROVENTIL) (2.5 MG/3ML) 0.083% nebulizer solution Take 3 mLs (2.5 mg total) by nebulization every 6 (six) hours as needed for wheezing or shortness of breath. 09/05/16   Virgel Manifold, MD  amoxicillin (AMOXIL) 500 MG capsule Take 1 capsule (500 mg total) 2 (two) times daily for 10 days by mouth. 03/19/17 03/29/17  Long, Wonda Olds, MD  Doxylamine-Pyridoxine 10-10 MG TBEC Take 1 tablet every 8 (eight) hours as needed for up to 10 days by  mouth. 03/19/17 03/29/17  Long, Wonda Olds, MD  prenatal vitamin w/FE, FA (PRENATAL 1 + 1) 27-1 MG TABS tablet Take 1 tablet by mouth daily at 12 noon. 01/18/17   Estill Dooms, NP    Family History Family History  Problem Relation Age of Onset  . Cholelithiasis Mother   . Kidney disease Mother        stones  . Depression Mother   . Hypertension Maternal Grandmother   . Diabetes Maternal Grandmother   . Stroke Maternal Grandmother   . Seizures Maternal Grandmother   . Asthma Maternal Grandmother   . Hyperlipidemia Maternal Grandmother   . Thyroid disease Maternal Grandmother   . Asthma Brother   . Ulcers Paternal Grandfather   . Seizures Maternal Uncle   . Cancer Other        breast- great aunt  . Celiac disease Neg Hx     Social History Social History   Tobacco Use  . Smoking status: Current Every Day Smoker    Packs/day: 0.00    Years: 1.00    Pack years: 0.00    Types: Cigarettes  . Smokeless tobacco: Never Used  . Tobacco comment: smokes 10 cig daily  Substance Use Topics  . Alcohol use: No    Alcohol/week: 0.0 oz  . Drug use: No     Allergies   Keflex [cephalexin]; Other; and Adhesive [tape]   Review of Systems Review of Systems  Constitutional: Negative for fever.  HENT: Positive for postnasal drip, rhinorrhea and sore throat. Negative for congestion and sinus pain.   Eyes: Negative for pain and redness.  Respiratory: Positive for cough. Negative for shortness of breath.   Cardiovascular: Negative for chest pain.  Gastrointestinal: Negative for abdominal pain, nausea and vomiting.  Genitourinary: Negative for flank pain.  Musculoskeletal: Positive for neck pain and neck stiffness. Negative for back pain.  Neurological: Negative for weakness and headaches.  Hematological: Positive for adenopathy.     Physical Exam Updated Vital Signs BP 124/74 (BP Location: Right Arm)   Pulse 99   Temp 98 F (36.7 C) (Oral)   Resp 16   Ht 4\' 11"  (1.499 m)    Wt 78 kg (172 lb)   LMP 02/10/2017   SpO2 100%   BMI 34.74 kg/m   Physical Exam  Constitutional: She appears well-developed and well-nourished. No distress.  HENT:  Head: Normocephalic and atraumatic.  Mouth/Throat: Oropharynx is clear and moist. No oropharyngeal exudate.  The oropharynx is clear and moist, there is no asymmetry, the uvula is midline, the tonsils are normal-appearing without exudate or erythema and phonation is normal.  No trismus  Eyes: Conjunctivae and EOM are normal. Pupils are equal, round, and reactive to light. Right eye exhibits no discharge. Left eye exhibits no discharge. No scleral icterus.  Neck: Normal range of  motion. Neck supple. No JVD present. No thyromegaly present.  There is tender lymphadenopathy in the bilateral posterior chains of the neck, there is no visible lumps or redness of the neck, she does have slight decreased range of motion of the neck to rotation but is able to flex and extend with less pain.  Cardiovascular: Normal rate, regular rhythm, normal heart sounds and intact distal pulses. Exam reveals no gallop and no friction rub.  No murmur heard. Pulmonary/Chest: Effort normal and breath sounds normal. No respiratory distress. She has no wheezes. She has no rales.  Musculoskeletal: Normal range of motion. She exhibits no edema or tenderness.  Lymphadenopathy:    She has no cervical adenopathy.  Neurological: She is alert. Coordination normal.  Skin: Skin is warm and dry. No rash noted. No erythema.  Psychiatric: She has a normal mood and affect. Her behavior is normal.  Nursing note and vitals reviewed.    ED Treatments / Results  Labs (all labs ordered are listed, but only abnormal results are displayed) Labs Reviewed - No data to display   Radiology No results found.  Procedures Procedures (including critical care time)  Medications Ordered in ED Medications - No data to display   Initial Impression / Assessment and Plan  / ED Course  I have reviewed the triage vital signs and the nursing notes.  Pertinent labs & imaging results that were available during my care of the patient were reviewed by me and considered in my medical decision making (see chart for details).    Overall the patient is well-appearing without any signs of peritonsillar abscess or retropharyngeal abscess.  Her pain is reproducible with tenderness and palpation over the lymphadenopathy chains, there is no trismus, minimal torticollis, doubt that this is related to meningitis as she has no headache, no fever.  Given that she is [redacted] weeks pregnant in the first trimester anti-inflammatory should be safe, will recommend that she take some ibuprofen in addition to the Tylenol with close follow-up in 48 hours.  She expressed her understanding as did her family member who is here with her  Final Clinical Impressions(s) / ED Diagnoses   Final diagnoses:  Neck pain  Lymphadenitis    ED Discharge Orders    None       Noemi Chapel, MD 03/26/17 1947

## 2017-03-29 ENCOUNTER — Ambulatory Visit: Payer: Medicaid Other | Admitting: Orthopaedic Surgery

## 2017-03-29 ENCOUNTER — Encounter: Payer: Self-pay | Admitting: Orthopaedic Surgery

## 2017-04-07 ENCOUNTER — Ambulatory Visit (INDEPENDENT_AMBULATORY_CARE_PROVIDER_SITE_OTHER): Payer: Medicaid Other

## 2017-04-07 ENCOUNTER — Other Ambulatory Visit: Payer: Self-pay | Admitting: Adult Health

## 2017-04-07 DIAGNOSIS — O3680X0 Pregnancy with inconclusive fetal viability, not applicable or unspecified: Secondary | ICD-10-CM | POA: Diagnosis not present

## 2017-04-07 DIAGNOSIS — Z3A08 8 weeks gestation of pregnancy: Secondary | ICD-10-CM

## 2017-04-07 MED ORDER — DOXYLAMINE-PYRIDOXINE ER 20-20 MG PO TBCR: 1.0000 | EXTENDED_RELEASE_TABLET | Freq: Two times a day (BID) | ORAL | 0 refills | Status: DC

## 2017-04-07 NOTE — Progress Notes (Signed)
[name] 8 wks,measurements c/w dates,positive fht 166 bpm,crl 15.15 mm,normal ovaries bilat

## 2017-04-07 NOTE — Progress Notes (Signed)
Pt here for dating [name] and has nausea and vomiting, given 24 bonjesta to try

## 2017-04-20 ENCOUNTER — Ambulatory Visit: Payer: Medicaid Other | Admitting: *Deleted

## 2017-04-20 ENCOUNTER — Encounter: Payer: Medicaid Other | Admitting: Advanced Practice Midwife

## 2017-04-21 ENCOUNTER — Ambulatory Visit (INDEPENDENT_AMBULATORY_CARE_PROVIDER_SITE_OTHER): Payer: Medicaid Other | Admitting: Women's Health

## 2017-04-21 ENCOUNTER — Encounter: Payer: Self-pay | Admitting: Women's Health

## 2017-04-21 VITALS — BP 110/80 | HR 98

## 2017-04-21 DIAGNOSIS — B9689 Other specified bacterial agents as the cause of diseases classified elsewhere: Secondary | ICD-10-CM

## 2017-04-21 DIAGNOSIS — N898 Other specified noninflammatory disorders of vagina: Secondary | ICD-10-CM | POA: Diagnosis not present

## 2017-04-21 DIAGNOSIS — Z3A10 10 weeks gestation of pregnancy: Secondary | ICD-10-CM

## 2017-04-21 DIAGNOSIS — N760 Acute vaginitis: Secondary | ICD-10-CM | POA: Diagnosis not present

## 2017-04-21 DIAGNOSIS — O26891 Other specified pregnancy related conditions, first trimester: Secondary | ICD-10-CM | POA: Diagnosis not present

## 2017-04-21 DIAGNOSIS — Z1389 Encounter for screening for other disorder: Secondary | ICD-10-CM

## 2017-04-21 DIAGNOSIS — Z331 Pregnant state, incidental: Secondary | ICD-10-CM | POA: Diagnosis not present

## 2017-04-21 DIAGNOSIS — Z113 Encounter for screening for infections with a predominantly sexual mode of transmission: Secondary | ICD-10-CM

## 2017-04-21 LAB — POCT WET PREP (WET MOUNT)
Clue Cells Wet Prep Whiff POC: POSITIVE
Trichomonas Wet Prep HPF POC: ABSENT

## 2017-04-21 LAB — POCT URINALYSIS DIPSTICK
Blood, UA: NEGATIVE
Glucose, UA: NEGATIVE
Ketones, UA: NEGATIVE
LEUKOCYTES UA: NEGATIVE
NITRITE UA: NEGATIVE
Protein, UA: NEGATIVE

## 2017-04-21 MED ORDER — METRONIDAZOLE 500 MG PO TABS: 500.0000 mg | ORAL_TABLET | Freq: Two times a day (BID) | ORAL | 0 refills | Status: DC

## 2017-04-21 MED ORDER — DOXYLAMINE-PYRIDOXINE ER 20-20 MG PO TBCR: 1.0000 | EXTENDED_RELEASE_TABLET | Freq: Every day | ORAL | 8 refills | Status: DC

## 2017-04-21 NOTE — Progress Notes (Signed)
   GYN VISIT Patient name: Stacey Cook MRN [id number]  Date of birth: Jan 11, 1996 Chief Complaint:   work-in-ob (cramping for a week. dizzy started this morning/ vaginal discharge)  History of Present Illness:   Stacey Cook is a 21 y.o. G64P0010 Caucasian female at [redacted]w[redacted]d being seen today as a work-in for report of cramping x 1 wk, dizziness today, malodorous vaginal d/c x about 1 week. Denies itching/irritation. Had recent 14wk SAB w/ d&c.   Patient's last menstrual period was 02/10/2017. The current method of family planning is none. Review of Systems:   Pertinent items are noted in HPI Denies fever/chills, dizziness, headaches, visual disturbances, fatigue, shortness of breath, chest pain, abdominal pain, vomiting, abnormal vaginal discharge/itching/odor/irritation, problems with periods, bowel movements, urination, or intercourse unless otherwise stated above.  Pertinent History Reviewed:  Reviewed past medical,surgical, social, obstetrical and family history.  Reviewed problem list, medications and allergies. Physical Assessment:   Vitals:   04/21/17 1503  BP: 110/80  Pulse: 98  There is no height or weight on file to calculate BMI.       Physical Examination:   General appearance: alert, well appearing, and in no distress  Mental status: alert, oriented to person, place, and time  Skin: warm & dry   Cardiovascular: normal heart rate noted  Respiratory: normal respiratory effort, no distress  Abdomen: soft, non-tender   Pelvic: VULVA: normal appearing vulva with no masses, tenderness or lesions, VAGINA: mod amt white malodorous thin d/c, CERVIX: normal appearing cervix without discharge or lesions  Extremities: no edema   Informal transabdominal u/s: +FCA w/ active fetus, intrauterine  Results for orders placed or performed in visit on 04/21/17 (from the past 24 hour(s))  POCT urinalysis dipstick   Collection Time: 04/21/17  3:18 PM  Result Value Ref Range   Color, UA       Clarity, UA     Glucose, UA neg    Bilirubin, UA     Ketones, UA neg    Spec Grav, UA  1.010 - 1.025   Blood, UA neg    pH, UA  5.0 - 8.0   Protein, UA neg    Urobilinogen, UA  0.2 or 1.0 E.U./dL   Nitrite, UA neg    Leukocytes, UA Negative Negative   Appearance     Odor    POCT Wet Prep [facility])   Collection Time: 04/21/17  3:46 PM  Result Value Ref Range   Source Wet Prep POC vaginal    WBC, Wet Prep HPF POC mod    Bacteria Wet Prep HPF POC None (A) Few   BACTERIA WET PREP MORPHOLOGY POC     Clue Cells Wet Prep HPF POC Many (A) None   Clue Cells Wet Prep Whiff POC Positive Whiff    Yeast Wet Prep HPF POC None    KOH Wet Prep POC     Trichomonas Wet Prep HPF POC Absent Absent    Assessment & Plan:  1) BV> Rx metronidazole 500mg  BID x 7d for BV, no sex while taking   2) Cramping> likely from BV, will also check gc/ct  3) Dizziness> discussed and gave printed prevention/relief measures   Orders Placed This Encounter  Procedures  . GC/Chlamydia Probe Amp  . POCT urinalysis dipstick  . POCT Wet Prep [facility])    Return for As scheduled. next week for new ob visit  Tawnya Crook CNM, [facility] 04/21/2017 3:47 PM

## 2017-04-21 NOTE — Patient Instructions (Signed)
For Dizzy Spells:   This is usually related to either your blood sugar or your blood pressure dropping  Make sure you are staying well hydrated and drinking enough water so that your urine is clear  Eat small frequent meals and snacks containing protein (meat, eggs, nuts, cheese) so that your blood sugar doesn't drop  If you do get dizzy, sit/lay down and get you something to drink and a snack containing protein- you will usually start feeling better in 10-20 minutes    Bacterial Vaginosis Bacterial vaginosis is a vaginal infection that occurs when the normal balance of bacteria in the vagina is disrupted. It results from an overgrowth of certain bacteria. This is the most common vaginal infection among women ages 36-44. Because bacterial vaginosis increases your risk for STIs (sexually transmitted infections), getting treated can help reduce your risk for chlamydia, gonorrhea, herpes, and HIV (human immunodeficiency virus). Treatment is also important for preventing complications in pregnant women, because this condition can cause an early (premature) delivery. What are the causes? This condition is caused by an increase in harmful bacteria that are normally present in small amounts in the vagina. However, the reason that the condition develops is not fully understood. What increases the risk? The following factors may make you more likely to develop this condition:  Having a new sexual partner or multiple sexual partners.  Having unprotected sex.  Douching.  Having an intrauterine device (IUD).  Smoking.  Drug and alcohol abuse.  Taking certain antibiotic medicines.  Being pregnant.  You cannot get bacterial vaginosis from toilet seats, bedding, swimming pools, or contact with objects around you. What are the signs or symptoms? Symptoms of this condition include:  Grey or white vaginal discharge. The discharge can also be watery or foamy.  A fish-like odor with discharge,  especially after sexual intercourse or during menstruation.  Itching in and around the vagina.  Burning or pain with urination.  Some women with bacterial vaginosis have no signs or symptoms. How is this diagnosed? This condition is diagnosed based on:  Your medical history.  A physical exam of the vagina.  Testing a sample of vaginal fluid under a microscope to look for a large amount of bad bacteria or abnormal cells. Your health care provider may use a cotton swab or a small wooden spatula to collect the sample.  How is this treated? This condition is treated with antibiotics. These may be given as a pill, a vaginal cream, or a medicine that is put into the vagina (suppository). If the condition comes back after treatment, a second round of antibiotics may be needed. Follow these instructions at home: Medicines  Take over-the-counter and prescription medicines only as told by your health care provider.  Take or use your antibiotic as told by your health care provider. Do not stop taking or using the antibiotic even if you start to feel better. General instructions  If you have a female sexual partner, tell her that you have a vaginal infection. She should see her health care provider and be treated if she has symptoms. If you have a female sexual partner, he does not need treatment.  During treatment: ? Avoid sexual activity until you finish treatment. ? Do not douche. ? Avoid alcohol as directed by your health care provider. ? Avoid breastfeeding as directed by your health care provider.  Drink enough water and fluids to keep your urine clear or pale yellow.  Keep the area around your vagina and rectum  clean. ? Wash the area daily with warm water. ? Wipe yourself from front to back after using the toilet.  Keep all follow-up visits as told by your health care provider. This is important. How is this prevented?  Do not douche.  Wash the outside of your vagina with warm  water only.  Use protection when having sex. This includes latex condoms and dental dams.  Limit how many sexual partners you have. To help prevent bacterial vaginosis, it is best to have sex with just one partner (monogamous).  Make sure you and your sexual partner are tested for STIs.  Wear cotton or cotton-lined underwear.  Avoid wearing tight pants and pantyhose, especially during summer.  Limit the amount of alcohol that you drink.  Do not use any products that contain nicotine or tobacco, such as cigarettes and e-cigarettes. If you need help quitting, ask your health care provider.  Do not use illegal drugs. Where to find more information:  Centers for Disease Control and Prevention: AppraiserFraud.fi  American Sexual Health Association (ASHA): www.ashastd.org  U.S. Department of Health and Financial controller, Office on Women's Health: DustingSprays.pl or SecuritiesCard.it Contact a health care provider if:  Your symptoms do not improve, even after treatment.  You have more discharge or pain when urinating.  You have a fever.  You have pain in your abdomen.  You have pain during sex.  You have vaginal bleeding between periods. Summary  Bacterial vaginosis is a vaginal infection that occurs when the normal balance of bacteria in the vagina is disrupted.  Because bacterial vaginosis increases your risk for STIs (sexually transmitted infections), getting treated can help reduce your risk for chlamydia, gonorrhea, herpes, and HIV (human immunodeficiency virus). Treatment is also important for preventing complications in pregnant women, because the condition can cause an early (premature) delivery.  This condition is treated with antibiotic medicines. These may be given as a pill, a vaginal cream, or a medicine that is put into the vagina (suppository). This information is not intended to replace advice given to you by your health  care provider. Make sure you discuss any questions you have with your health care provider. Document Released: 04/26/2005 Document Revised: 01/10/2016 Document Reviewed: 01/10/2016 Elsevier Interactive Patient Education  2017 [employer].

## 2017-04-21 NOTE — Addendum Note (Signed)
Addended by: Diona Fanti A on: 04/21/2017 03:58 PM   Modules accepted: Orders

## 2017-04-25 ENCOUNTER — Telehealth: Payer: Self-pay

## 2017-04-25 ENCOUNTER — Other Ambulatory Visit: Payer: Self-pay | Admitting: Women's Health

## 2017-04-25 ENCOUNTER — Telehealth: Payer: Self-pay | Admitting: *Deleted

## 2017-04-25 MED ORDER — DOXYLAMINE-PYRIDOXINE 10-10 MG PO TBEC: DELAYED_RELEASE_TABLET | ORAL | 6 refills | Status: DC

## 2017-04-25 NOTE — Telephone Encounter (Signed)
Pt is on a list of [facility] ED visitors. Pt was called to the number provided [phone]. Pts mother, Ms. Hoyle Barr, answered and said that pt was not available at this time. After stating to the mother that I was calling on behalf of [place] and was just checking on the status of the patient I was able to leave a message with the patients mother to return the phone call to [phone].  Will call at a later date and time.    Solene Hereford R. [place], LPN [phone]

## 2017-04-25 NOTE — Telephone Encounter (Signed)
Called to inform patient that prescription was changed to Diclegis. Patient states she has already gotten the [place]. Informed she could continue taking it.

## 2017-04-26 LAB — GC/CHLAMYDIA PROBE AMP
Chlamydia trachomatis, NAA: NEGATIVE
NEISSERIA GONORRHOEAE BY PCR: NEGATIVE

## 2017-04-28 ENCOUNTER — Ambulatory Visit (INDEPENDENT_AMBULATORY_CARE_PROVIDER_SITE_OTHER): Payer: Medicaid Other | Admitting: Advanced Practice Midwife

## 2017-04-28 ENCOUNTER — Encounter: Payer: Self-pay | Admitting: Advanced Practice Midwife

## 2017-04-28 ENCOUNTER — Other Ambulatory Visit: Payer: Medicaid Other | Admitting: *Deleted

## 2017-04-28 VITALS — BP 122/82 | HR 75 | Wt 170.0 lb

## 2017-04-28 DIAGNOSIS — Z3A11 11 weeks gestation of pregnancy: Secondary | ICD-10-CM

## 2017-04-28 DIAGNOSIS — Z3481 Encounter for supervision of other normal pregnancy, first trimester: Principal | ICD-10-CM

## 2017-04-28 DIAGNOSIS — Z331 Pregnant state, incidental: Secondary | ICD-10-CM

## 2017-04-28 DIAGNOSIS — Z3682 Encounter for antenatal screening for nuchal translucency: Secondary | ICD-10-CM

## 2017-04-28 DIAGNOSIS — O0990 Supervision of high risk pregnancy, unspecified, unspecified trimester: Secondary | ICD-10-CM | POA: Insufficient documentation

## 2017-04-28 DIAGNOSIS — Z1389 Encounter for screening for other disorder: Secondary | ICD-10-CM

## 2017-04-28 LAB — POCT URINALYSIS DIPSTICK
GLUCOSE UA: NEGATIVE
KETONES UA: NEGATIVE
Leukocytes, UA: NEGATIVE
Nitrite, UA: NEGATIVE
Protein, UA: NEGATIVE
RBC UA: NEGATIVE

## 2017-04-28 MED ORDER — PROMETHAZINE HCL 25 MG PO TABS: 25.0000 mg | ORAL_TABLET | Freq: Four times a day (QID) | ORAL | 1 refills | Status: DC | PRN

## 2017-04-28 NOTE — Progress Notes (Signed)
Subjective:    Stacey Cook is a G2P0010 [redacted]w[redacted]d being seen today for her first obstetrical visit.  Her obstetrical history is significant for 14 week SAB May 2018.   With D&C Pregnancy history fully reviewed.  Patient reports fatigue.  Vitals:   04/28/17 1402  BP: 122/82  Pulse: 75  Weight: 170 lb (77.1 kg)    HISTORY: OB History  Gravida Para Term Preterm AB Living  2 0 0 0 1 0  SAB TAB Ectopic Multiple Live Births  1 0 0 0 0    # Outcome Date GA Lbr Len/2nd Weight Sex Delivery Anes PTL Lv  2 Current           1 SAB 09/11/16 [redacted]w[redacted]d            Past Medical History:  Diagnosis Date  . Abdominal pain   . ADD (attention deficit disorder)   . Anxiety   . Arthritis   . Asthma   . 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 Cervicalgia   . Chronic abdominal pain   . Constipation   . Depression   . Ear mass   . Headache   . HSV infection   . Suicidal intent   . UTI (lower urinary tract infection) 05/2014  . Vomiting    Past Surgical History:  Procedure Laterality Date  . CHOLESTEATOMA EXCISION    . DILATION AND EVACUATION N/A 09/11/2016   Procedure: DILATATION AND CURRATAGE  2ND TRIMESTER;  Surgeon: Jonnie Kind, MD;  Location: [facility];  Service: Gynecology;  Laterality: N/A;  . IMPLANTATION BONE ANCHORED HEARING AID Right 04/2013  . 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 TYMPANOPLASTY Left   . TYMPANOSTOMY     Family History  Problem Relation Age of Onset  . Cholelithiasis Mother   . Kidney disease Mother        stones  . Depression Mother   . Hypertension Maternal Grandmother   . Diabetes Maternal Grandmother   . Stroke Maternal Grandmother   . Seizures Maternal Grandmother   . Asthma Maternal Grandmother   . Hyperlipidemia Maternal Grandmother   . Thyroid disease Maternal Grandmother   . Asthma Brother   . Seizures Maternal Uncle   . Cancer Other        breast- great aunt  . Celiac disease Neg Hx       Exam                                      System:     Skin: normal coloration and turgor, no rashes    Neurologic: oriented, normal, normal mood   Extremities: normal strength, tone, and muscle mass   HEENT PERRLA   Mouth/Teeth mucous membranes moist, normal dentition   Neck supple and no masses   Cardiovascular: regular rate and rhythm   Respiratory:  appears well, vitals normal, no respiratory distress, acyanotic   Abdomen: soft, non-tender;  FHR: 160 [name]        The nature of [place] with multiple MDs and other Advanced Practice Providers was explained to patient; also emphasized that residents, students are part of our team.  Assessment:    Pregnancy: G2P0010 Patient Active Problem List   Diagnosis Date Noted  . Supervision of normal pregnancy 04/28/2017  . Pregnancy examination or test, negative  result 01/18/2017  . History of miscarriage 01/18/2017  . GBS bacteriuria 08/07/2016  . Conductive hearing loss, unilat, unrestrict hearing contralateral side 12/10/2015  . Severe episode of recurrent major depressive disorder, without psychotic features ([place])   . High-risk sexual behavior   . 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 Asthma, mild intermittent 02/18/2015  . 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 Perforation of left tympanic membrane 06/19/2014  . Cervicalgia 12/20/2013  . Abdominal pain, chronic, epigastric 08/08/2013  . Acne 08/22/2012  . Syncope 08/16/2012  . Seasonal allergies 08/16/2012  . GAD (generalized anxiety disorder) 08/01/2012  . ADHD (attention deficit hyperactivity disorder), combined type 08/01/2012  . ODD (oppositional defiant disorder) 08/01/2012  . Excessive gas 05/29/2012  . Vasovagal syncope 02/28/2012  . Cholesteatoma of attic of right ear 02/15/2011         Plan:     Initial labs drawn. Continue prenatal vitamins  Problem list reviewed and updated  Reviewed n/v relief measures and warning s/s to report  Reviewed recommended weight gain based on pre-gravid BMI  Encouraged well-balanced diet Genetic Screening discussed Integrated Screen: requested.  Ultrasound discussed; fetal survey: requested.  No Follow-up on file.  CRESENZO-DISHMAN,Kyah Buesing 04/28/2017

## 2017-04-28 NOTE — Patient Instructions (Signed)
 First Trimester of Pregnancy The first trimester of pregnancy is from week 1 until the end of week 12 (months 1 through 3). A week after a sperm fertilizes an egg, the egg will implant on the wall of the uterus. This embryo will begin to develop into a baby. Genes from you and your partner are forming the baby. The female genes determine whether the baby is a boy or a girl. At 6-8 weeks, the eyes and face are formed, and the heartbeat can be seen on ultrasound. At the end of 12 weeks, all the baby's organs are formed.  Now that you are pregnant, you will want to do everything you can to have a healthy baby. Two of the most important things are to get good prenatal care and to follow your health care provider's instructions. Prenatal care is all the medical care you receive before the baby's birth. This care will help prevent, find, and treat any problems during the pregnancy and childbirth. BODY CHANGES Your body goes through many changes during pregnancy. The changes vary from woman to woman.   You may gain or lose a couple of pounds at first.  You may feel sick to your stomach (nauseous) and throw up (vomit). If the vomiting is uncontrollable, call your health care provider.  You may tire easily.  You may develop headaches that can be relieved by medicines approved by your health care provider.  You may urinate more often. Painful urination may mean you have a bladder infection.  You may develop heartburn as a result of your pregnancy.  You may develop constipation because certain hormones are causing the muscles that push waste through your intestines to slow down.  You may develop hemorrhoids or swollen, bulging veins (varicose veins).  Your breasts may begin to grow larger and become tender. Your nipples may stick out more, and the tissue that surrounds them (areola) may become darker.  Your gums may bleed and may be sensitive to brushing and flossing.  Dark spots or blotches  (chloasma, mask of pregnancy) may develop on your face. This will likely fade after the baby is born.  Your menstrual periods will stop.  You may have a loss of appetite.  You may develop cravings for certain kinds of food.  You may have changes in your emotions from day to day, such as being excited to be pregnant or being concerned that something may go wrong with the pregnancy and baby.  You may have more vivid and strange dreams.  You may have changes in your hair. These can include thickening of your hair, rapid growth, and changes in texture. Some women also have hair loss during or after pregnancy, or hair that feels dry or thin. Your hair will most likely return to normal after your baby is born. WHAT TO EXPECT AT YOUR PRENATAL VISITS During a routine prenatal visit:  You will be weighed to make sure you and the baby are growing normally.  Your blood pressure will be taken.  Your abdomen will be measured to track your baby's growth.  The fetal heartbeat will be listened to starting around week 10 or 12 of your pregnancy.  Test results from any previous visits will be discussed. Your health care provider may ask you:  How you are feeling.  If you are feeling the baby move.  If you have had any abnormal symptoms, such as leaking fluid, bleeding, severe headaches, or abdominal cramping.  If you have any questions. Other   tests that may be performed during your first trimester include:  Blood tests to find your blood type and to check for the presence of any previous infections. They will also be used to check for low iron levels (anemia) and Rh antibodies. Later in the pregnancy, blood tests for diabetes will be done along with other tests if problems develop.  Urine tests to check for infections, diabetes, or protein in the urine.  An ultrasound to confirm the proper growth and development of the baby.  An amniocentesis to check for possible genetic problems.  Fetal  screens for spina bifida and Down syndrome.  You may need other tests to make sure you and the baby are doing well. HOME CARE INSTRUCTIONS  Medicines  Follow your health care provider's instructions regarding medicine use. Specific medicines may be either safe or unsafe to take during pregnancy.  Take your prenatal vitamins as directed.  If you develop constipation, try taking a stool softener if your health care provider approves. Diet  Eat regular, well-balanced meals. Choose a variety of foods, such as meat or vegetable-based protein, fish, milk and low-fat dairy products, vegetables, fruits, and whole grain breads and cereals. Your health care provider will help you determine the amount of weight gain that is right for you.  Avoid raw meat and uncooked cheese. These carry germs that can cause birth defects in the baby.  Eating four or five small meals rather than three large meals a day may help relieve nausea and vomiting. If you start to feel nauseous, eating a few soda crackers can be helpful. Drinking liquids between meals instead of during meals also seems to help nausea and vomiting.  If you develop constipation, eat more high-fiber foods, such as fresh vegetables or fruit and whole grains. Drink enough fluids to keep your urine clear or pale yellow. Activity and Exercise  Exercise only as directed by your health care provider. Exercising will help you:  Control your weight.  Stay in shape.  Be prepared for labor and delivery.  Experiencing pain or cramping in the lower abdomen or low back is a good sign that you should stop exercising. Check with your health care provider before continuing normal exercises.  Try to avoid standing for long periods of time. Move your legs often if you must stand in one place for a long time.  Avoid heavy lifting.  Wear low-heeled shoes, and practice good posture.  You may continue to have sex unless your health care provider directs you  otherwise. Relief of Pain or Discomfort  Wear a good support bra for breast tenderness.   Take warm sitz baths to soothe any pain or discomfort caused by hemorrhoids. Use hemorrhoid cream if your health care provider approves.   Rest with your legs elevated if you have leg cramps or low back pain.  If you develop varicose veins in your legs, wear support hose. Elevate your feet for 15 minutes, 3-4 times a day. Limit salt in your diet. Prenatal Care  Schedule your prenatal visits by the twelfth week of pregnancy. They are usually scheduled monthly at first, then more often in the last 2 months before delivery.  Write down your questions. Take them to your prenatal visits.  Keep all your prenatal visits as directed by your health care provider. Safety  Wear your seat belt at all times when driving.  Make a list of emergency phone numbers, including numbers for family, friends, the hospital, and police and fire departments. General   Tips  Ask your health care provider for a referral to a local prenatal education class. Begin classes no later than at the beginning of month 6 of your pregnancy.  Ask for help if you have counseling or nutritional needs during pregnancy. Your health care provider can offer advice or refer you to specialists for help with various needs.  Do not use hot tubs, steam rooms, or saunas.  Do not douche or use tampons or scented sanitary pads.  Do not cross your legs for long periods of time.  Avoid cat litter boxes and soil used by cats. These carry germs that can cause birth defects in the baby and possibly loss of the fetus by miscarriage or stillbirth.  Avoid all smoking, herbs, alcohol, and medicines not prescribed by your health care provider. Chemicals in these affect the formation and growth of the baby.  Schedule a dentist appointment. At home, brush your teeth with a soft toothbrush and be gentle when you floss. SEEK MEDICAL CARE IF:   You have  dizziness.  You have mild pelvic cramps, pelvic pressure, or nagging pain in the abdominal area.  You have persistent nausea, vomiting, or diarrhea.  You have a bad smelling vaginal discharge.  You have pain with urination.  You notice increased swelling in your face, hands, legs, or ankles. SEEK IMMEDIATE MEDICAL CARE IF:   You have a fever.  You are leaking fluid from your vagina.  You have spotting or bleeding from your vagina.  You have severe abdominal cramping or pain.  You have rapid weight gain or loss.  You vomit blood or material that looks like coffee grounds.  You are exposed to German measles and have never had them.  You are exposed to fifth disease or chickenpox.  You develop a severe headache.  You have shortness of breath.  You have any kind of trauma, such as from a fall or a car accident. Document Released: 04/20/2001 Document Revised: 09/10/2013 Document Reviewed: 03/06/2013 [employer] Patient Information 2015 [employer]. This information is not intended to replace advice given to you by your health care provider. Make sure you discuss any questions you have with your health care provider.   Nausea & Vomiting  Have saltine crackers or pretzels by your bed and eat a few bites before you raise your head out of bed in the morning  Eat small frequent meals throughout the day instead of large meals  Drink plenty of fluids throughout the day to stay hydrated, just don't drink a lot of fluids with your meals.  This can make your stomach fill up faster making you feel sick  Do not brush your teeth right after you eat  Products with real ginger are good for nausea, like ginger ale and ginger hard candy Make sure it says made with real ginger!  Sucking on sour candy like lemon heads is also good for nausea  If your prenatal vitamins make you nauseated, take them at night so you will sleep through the nausea  Sea Bands  If you feel like you need  medicine for the nausea & vomiting please let us know  If you are unable to keep any fluids or food down please let us know   Constipation  Drink plenty of fluid, preferably water, throughout the day  Eat foods high in fiber such as fruits, vegetables, and grains  Exercise, such as walking, is a good way to keep your bowels regular  Drink warm fluids, especially warm   prune juice, or decaf coffee  Eat a 1/2 cup of real oatmeal (not instant), 1/2 cup applesauce, and 1/2-1 cup warm prune juice every day  If needed, you may take Colace (docusate sodium) stool softener once or twice a day to help keep the stool soft. If you are pregnant, wait until you are out of your first trimester (12-14 weeks of pregnancy)  If you still are having problems with constipation, you may take Miralax once daily as needed to help keep your bowels regular.  If you are pregnant, wait until you are out of your first trimester (12-14 weeks of pregnancy)  Safe Medications in Pregnancy   Acne: Benzoyl Peroxide Salicylic Acid  Backache/Headache: Tylenol: 2 regular strength every 4 hours OR              2 Extra strength every 6 hours  Colds/Coughs/Allergies: Benadryl (alcohol free) 25 mg every 6 hours as needed Breath right strips Claritin Cepacol throat lozenges Chloraseptic throat spray Cold-Eeze- up to three times per day Cough drops, alcohol free Flonase (by prescription only) Guaifenesin Mucinex Robitussin DM (plain only, alcohol free) Saline nasal spray/drops Sudafed (pseudoephedrine) & Actifed ** use only after [redacted] weeks gestation and if you do not have high blood pressure Tylenol Vicks Vaporub Zinc lozenges Zyrtec   Constipation: Colace Ducolax suppositories Fleet enema Glycerin suppositories Metamucil Milk of magnesia Miralax Senokot Smooth move tea  Diarrhea: Kaopectate Imodium A-D  *NO pepto Bismol  Hemorrhoids: Anusol Anusol HC Preparation  H Tucks  Indigestion: Tums Maalox Mylanta Zantac  Pepcid  Insomnia: Benadryl (alcohol free) 25mg every 6 hours as needed Tylenol PM Unisom, no Gelcaps  Leg Cramps: Tums MagGel  Nausea/Vomiting:  Bonine Dramamine Emetrol Ginger extract Sea bands Meclizine  Nausea medication to take during pregnancy:  Unisom (doxylamine succinate 25 mg tablets) Take one tablet daily at bedtime. If symptoms are not adequately controlled, the dose can be increased to a maximum recommended dose of two tablets daily (1/2 tablet in the morning, 1/2 tablet mid-afternoon and one at bedtime). Vitamin B6 100mg tablets. Take one tablet twice a day (up to 200 mg per day).  Skin Rashes: Aveeno products Benadryl cream or 25mg every 6 hours as needed Calamine Lotion 1% cortisone cream  Yeast infection: Gyne-lotrimin 7 Monistat 7   **If taking multiple medications, please check labels to avoid duplicating the same active ingredients **take medication as directed on the label ** Do not exceed 4000 mg of tylenol in 24 hours **Do not take medications that contain aspirin or ibuprofen      

## 2017-04-29 LAB — URINALYSIS, ROUTINE W REFLEX MICROSCOPIC
Bilirubin, UA: NEGATIVE
GLUCOSE, UA: NEGATIVE
KETONES UA: NEGATIVE
LEUKOCYTES UA: NEGATIVE
NITRITE UA: NEGATIVE
PROTEIN UA: NEGATIVE
RBC, UA: NEGATIVE
SPEC GRAV UA: 1.022 (ref 1.005–1.030)
Urobilinogen, Ur: 0.2 mg/dL (ref 0.2–1.0)
pH, UA: 6 (ref 5.0–7.5)

## 2017-04-29 LAB — PMP SCREEN PROFILE (10S), URINE
Amphetamine Scrn, Ur: NEGATIVE ng/mL
BARBITURATE SCREEN URINE: NEGATIVE ng/mL
BENZODIAZEPINE SCREEN, URINE: NEGATIVE ng/mL
CANNABINOIDS UR QL SCN: NEGATIVE ng/mL
CREATININE(CRT), U: 144.5 mg/dL (ref 20.0–300.0)
Cocaine (Metab) Scrn, Ur: NEGATIVE ng/mL
Methadone Screen, Urine: NEGATIVE ng/mL
OXYCODONE+OXYMORPHONE UR QL SCN: NEGATIVE ng/mL
Opiate Scrn, Ur: NEGATIVE ng/mL
PH UR, DRUG SCRN: 5.8 (ref 4.5–8.9)
PHENCYCLIDINE QUANTITATIVE URINE: NEGATIVE ng/mL
Propoxyphene Scrn, Ur: NEGATIVE ng/mL

## 2017-04-29 LAB — CBC
HEMATOCRIT: 38.1 % (ref 34.0–46.6)
HEMOGLOBIN: 12.5 g/dL (ref 11.1–15.9)
MCH: 28.9 pg (ref 26.6–33.0)
MCHC: 32.8 g/dL (ref 31.5–35.7)
MCV: 88 fL (ref 79–97)
Platelets: 228 10*3/uL (ref 150–379)
RBC: 4.32 x10E6/uL (ref 3.77–5.28)
RDW: 14.8 % (ref 12.3–15.4)
WBC: 10.3 10*3/uL (ref 3.4–10.8)

## 2017-04-29 LAB — ANTIBODY SCREEN: Antibody Screen: NEGATIVE

## 2017-04-29 LAB — RUBELLA SCREEN: Rubella Antibodies, IGG: <0.9 index — ABNORMAL LOW (ref >0.99)

## 2017-04-29 LAB — RPR: RPR: NONREACTIVE

## 2017-04-29 LAB — HIV ANTIBODY (ROUTINE TESTING W REFLEX): HIV Screen 4th Generation wRfx: NONREACTIVE

## 2017-04-29 LAB — HEPATITIS B SURFACE ANTIGEN: HEP B S AG: NEGATIVE

## 2017-04-30 LAB — URINE CULTURE: ORGANISM ID, BACTERIA: NO GROWTH

## 2017-05-01 LAB — GC/CHLAMYDIA PROBE AMP
CHLAMYDIA, DNA PROBE: NEGATIVE
NEISSERIA GONORRHOEAE BY PCR: NEGATIVE

## 2017-05-03 ENCOUNTER — Emergency Department (HOSPITAL_COMMUNITY)
Admission: EM | Admit: 2017-05-03 | Discharge: 2017-05-03 | Discharge status: Against Medical Advice | Payer: Medicaid Other | Attending: Emergency Medicine | Admitting: Emergency Medicine

## 2017-05-03 ENCOUNTER — Encounter (HOSPITAL_COMMUNITY): Payer: Self-pay | Admitting: Emergency Medicine

## 2017-05-03 DIAGNOSIS — Z3A11 11 weeks gestation of pregnancy: Secondary | ICD-10-CM | POA: Diagnosis not present

## 2017-05-03 DIAGNOSIS — O9989 Other specified diseases and conditions complicating pregnancy, childbirth and the puerperium: Secondary | ICD-10-CM | POA: Insufficient documentation

## 2017-05-03 DIAGNOSIS — R109 Unspecified abdominal pain: Secondary | ICD-10-CM | POA: Insufficient documentation

## 2017-05-03 DIAGNOSIS — Z5321 Procedure and treatment not carried out due to patient leaving prior to being seen by health care provider: Secondary | ICD-10-CM | POA: Insufficient documentation

## 2017-05-03 LAB — COMPREHENSIVE METABOLIC PANEL
ALBUMIN: 3.6 g/dL (ref 3.5–5.0)
ALK PHOS: 95 U/L (ref 38–126)
ALT: 29 U/L (ref 14–54)
AST: 24 U/L (ref 15–41)
Anion gap: 10 (ref 5–15)
BILIRUBIN TOTAL: 0.3 mg/dL (ref 0.3–1.2)
BUN: 6 mg/dL (ref 6–20)
CALCIUM: 9.3 mg/dL (ref 8.9–10.3)
CO2: 19 mmol/L — ABNORMAL LOW (ref 22–32)
Chloride: 103 mmol/L (ref 101–111)
Creatinine, Ser: 0.46 mg/dL (ref 0.44–1.00)
GFR calc Af Amer: >60 mL/min (ref >60)
GFR calc non Af Amer: >60 mL/min (ref >60)
GLUCOSE: 100 mg/dL — AB (ref 65–99)
Potassium: 3.7 mmol/L (ref 3.5–5.1)
Sodium: 132 mmol/L — ABNORMAL LOW (ref 135–145)
TOTAL PROTEIN: 7.7 g/dL (ref 6.5–8.1)

## 2017-05-03 LAB — CBC
HCT: 37.1 % (ref 36.0–46.0)
Hemoglobin: 12.6 g/dL (ref 12.0–15.0)
MCH: 29.6 pg (ref 26.0–34.0)
MCHC: 34 g/dL (ref 30.0–36.0)
MCV: 87.1 fL (ref 78.0–100.0)
Platelets: 201 10*3/uL (ref 150–400)
RBC: 4.26 MIL/uL (ref 3.87–5.11)
RDW: 13.6 % (ref 11.5–15.5)
WBC: 9.5 10*3/uL (ref 4.0–10.5)

## 2017-05-03 LAB — LIPASE, BLOOD: Lipase: 26 U/L (ref 11–51)

## 2017-05-03 LAB — HCG, QUANTITATIVE, PREGNANCY: HCG, BETA CHAIN, QUANT, S: 43892 m[IU]/mL — AB (ref <5)

## 2017-05-03 NOTE — ED Triage Notes (Signed)
Pt reports increased nausea x 2 days with abd pain starting today.  States she has severe heartburn and pain is across the top/epigastric area.  Denies bleeding.

## 2017-05-03 NOTE — ED Notes (Signed)
Pt states she will follow up with ob/gyn and left department in no distress.

## 2017-05-09 ENCOUNTER — Telehealth: Payer: Self-pay | Admitting: *Deleted

## 2017-05-09 NOTE — Telephone Encounter (Signed)
Pharmacy made aware PA done and approved for Diclegis.  PA number [id number].

## 2017-05-10 NOTE — L&D Delivery Note (Signed)
Delivery Note  Called to room for patient crowning, patient pushed effectively and delivered head from LOA position. Shoulder dystocia of right shoulder relieved with McRoberts and delivery of posterior shoulder. Infant with poor tone and respiratory effort on delivery, cord immediately clamped and cut and infant taken to warmer for waiting staff. Infant status improved with resuscitation measures. Placenta delivered spontaneously intact. Bilateral peri-urethral lacerations noted with repair of the left one for hemostasis with several interrupted sutures of 3-0 Vicryl. Brisk bleeding noted from cervix, patient given IV pitocin and IM methergine with no improvement. Lower uterine segment remained boggy despite massage and uterotonics, tranexamic acid given. Despite ongoing massage and uterotonics, no improvement noted in lower uterine segment and she continued to bleed so bakri balloon placed with 300 mL NS put into balloon to good effect. Will cont to monitor for ongoing bleeding. Mom tolerated procedure very well.    Anesthesia:  epidural Episiotomy: None Lacerations: Periurethral Suture Repair: 3.0 vicryl Est. Blood Loss (mL):  1085 mL  Mom to postpartum.  Baby to Couplet care / Skin to Skin.  Sloan Leiter 10/28/2017, 2:50 AM

## 2017-05-13 ENCOUNTER — Encounter: Payer: Self-pay | Admitting: Obstetrics and Gynecology

## 2017-05-13 ENCOUNTER — Ambulatory Visit (INDEPENDENT_AMBULATORY_CARE_PROVIDER_SITE_OTHER): Payer: Medicaid Other | Admitting: Women's Health

## 2017-05-13 ENCOUNTER — Ambulatory Visit (INDEPENDENT_AMBULATORY_CARE_PROVIDER_SITE_OTHER): Payer: Medicaid Other

## 2017-05-13 VITALS — BP 110/80 | HR 85 | Wt 170.5 lb

## 2017-05-13 DIAGNOSIS — Z3682 Encounter for antenatal screening for nuchal translucency: Secondary | ICD-10-CM

## 2017-05-13 DIAGNOSIS — Z331 Pregnant state, incidental: Secondary | ICD-10-CM

## 2017-05-13 DIAGNOSIS — O26891 Other specified pregnancy related conditions, first trimester: Secondary | ICD-10-CM

## 2017-05-13 DIAGNOSIS — Z3A13 13 weeks gestation of pregnancy: Secondary | ICD-10-CM

## 2017-05-13 DIAGNOSIS — O219 Vomiting of pregnancy, unspecified: Secondary | ICD-10-CM

## 2017-05-13 DIAGNOSIS — Z3402 Encounter for supervision of normal first pregnancy, second trimester: Secondary | ICD-10-CM

## 2017-05-13 DIAGNOSIS — Z1389 Encounter for screening for other disorder: Secondary | ICD-10-CM

## 2017-05-13 DIAGNOSIS — Z3482 Encounter for supervision of other normal pregnancy, second trimester: Secondary | ICD-10-CM

## 2017-05-13 DIAGNOSIS — R51 Headache: Secondary | ICD-10-CM

## 2017-05-13 LAB — POCT URINALYSIS DIPSTICK
Blood, UA: NEGATIVE
Glucose, UA: NEGATIVE
Ketones, UA: NEGATIVE
Leukocytes, UA: NEGATIVE
Nitrite, UA: NEGATIVE
Protein, UA: NEGATIVE

## 2017-05-13 MED ORDER — PANTOPRAZOLE SODIUM 20 MG PO TBEC: 20.0000 mg | DELAYED_RELEASE_TABLET | Freq: Every day | ORAL | 6 refills | Status: DC

## 2017-05-13 NOTE — Progress Notes (Signed)
   LOW-RISK PREGNANCY VISIT Patient name: Stacey Cook MRN [id number]  Date of birth: 05/02/1996 Chief Complaint:   Routine Prenatal Visit (1st IT; back pain; vomiting more than usual; migraines )  History of Present Illness:   Stacey Cook is a 22 y.o. G18P0010 female at [redacted]w[redacted]d with an Estimated Date of Delivery: 11/17/17 being seen today for ongoing management of a low-risk pregnancy.  Today she reports back pain, n/v-phenergan helps during day but not at night, headaches. Leaving for [place] today d/t death in family, riding in car  . Vag. Bleeding: None.   . denies leaking of fluid. Review of Systems:   Pertinent items are noted in HPI Denies abnormal vaginal discharge w/ itching/odor/irritation, headaches, visual changes, shortness of breath, chest pain, abdominal pain, severe nausea/vomiting, or problems with urination or bowel movements unless otherwise stated above. Pertinent History Reviewed:  Reviewed past medical,surgical, social, obstetrical and family history.  Reviewed problem list, medications and allergies. Physical Assessment:   Vitals:   05/13/17 1057  BP: 110/80  Pulse: 85  Weight: 170 lb 8 oz (77.3 kg)  Body mass index is 34.44 kg/m.        Physical Examination:   General appearance: Well appearing, and in no distress  Mental status: Alert, oriented to person, place, and time  Skin: Warm & dry  Cardiovascular: Normal heart rate noted  Respiratory: Normal respiratory effort, no distress  Abdomen: Soft, gravid, nontender  Pelvic: Cervical exam deferred         Extremities: Edema: Trace  Fetal Status: Fetal Heart Rate (bpm): 164 u/s        [name] 13+1 wks,measurements c/w dates,crl 68.56 mm,NB present,NT 1.63mm,fhr 164 bpm,normal ovaries bilat,anterior pl gr 0  Results for orders placed or performed in visit on 05/13/17 (from the past 24 hour(s))  POCT urinalysis dipstick   Collection Time: 05/13/17 10:59 AM  Result Value Ref Range   Color, UA     Clarity, UA     Glucose, UA neg    Bilirubin, UA     Ketones, UA neg    Spec Grav, UA  1.010 - 1.025   Blood, UA neg    pH, UA  5.0 - 8.0   Protein, UA neg    Urobilinogen, UA  0.2 or 1.0 E.U./dL   Nitrite, UA neg    Leukocytes, UA Negative Negative   Appearance     Odor      Assessment & Plan:  1) Low-risk pregnancy G2P0010 at [redacted]w[redacted]d with an Estimated Date of Delivery: 11/17/17   2) N/V, continue phenergan, rx protonix to see if helps, if not let us know  3) Headaches> gave printed prevention/relief measures   4) Low back pain> gave printed prevention/relief measures   5) Traveling long-distance> Discussed she is at a high risk for DVT/PE when pregnancy and traveling increases that risk. Recommended walking around q 1hr, don't cross legs, and dorsiflex feet often to help decrease r/f DVT/PE.   Labs/procedures today: 1st IT  Plan:  Continue routine obstetrical care   Reviewed: Preterm labor symptoms and general obstetric precautions including but not limited to vaginal bleeding, contractions, leaking of fluid and fetal movement were reviewed in detail with the patient.  All questions were answered   Follow-up: Return in about 3 weeks (around 06/03/2017) for [place], 2nd IT.  Orders Placed This Encounter  Procedures  . Integrated 1  . POCT urinalysis dipstick   Tawnya Crook CNM, [facility] 05/13/2017 12:01 PM

## 2017-05-13 NOTE — Patient Instructions (Addendum)
Stacey Cook, I greatly value your feedback.  If you receive a survey following your visit with [name] today, we appreciate you taking the time to fill it out.  Thanks, [name], CNM, WHNP-BC  For your lower back pain you may:  Purchase a pregnancy belt from [employer] [name], [employer], [employer], etc and wear it while you are up and about  Take warm baths  Use a heating pad to your lower back for no longer than 20 minutes at a time, and do not place near abdomen  Take tylenol as needed. Please follow directions on the bottle  Kinesthesiology tape (can get from sporting goods store), google how to tape belly for pregnancy  For Headaches:   Stay well hydrated, drink enough water so that your urine is clear, sometimes if you are dehydrated you can get headaches  Eat small frequent meals and snacks, sometimes if you are hungry you can get headaches  Sometimes you get headaches during pregnancy from the pregnancy hormones  You can try tylenol (1-2 regular strength 325mg  or 1-2 extra strength 500mg ) as directed on the box. The least amount of medication that works is best.   Cool compresses (cool wet washcloth or ice pack) to area of head that is hurting  You can also try drinking a caffeinated drink to see if this will help  If not helping, try below:  For Prevention of Headaches/Migraines:  CoQ10 100mg  three times daily  Vitamin B2 400mg  daily  Magnesium Oxide 400-600mg  daily  If You Get a Bad Headache/Migraine:  Benadryl 25mg    Magnesium Oxide  1 large Gatorade  2 extra strength Tylenol (1,000mg  total)  1 cup coffee or Coke  If this doesn't help please call us @ [phone]       Second Trimester of Pregnancy The second trimester is from week 14 through week 27 (months 4 through 6). The second trimester is often a time when you feel your best. Your body has adjusted to being pregnant, and you begin to feel better physically. Usually, morning sickness has  lessened or quit completely, you may have more energy, and you may have an increase in appetite. The second trimester is also a time when the fetus is growing rapidly. At the end of the sixth month, the fetus is about 9 inches long and weighs about 1 pounds. You will likely begin to feel the baby move (quickening) between 16 and 20 weeks of pregnancy. Body changes during your second trimester Your body continues to go through many changes during your second trimester. The changes vary from woman to woman.  Your weight will continue to increase. You will notice your lower abdomen bulging out.  You may begin to get stretch marks on your hips, abdomen, and breasts.  You may develop headaches that can be relieved by medicines. The medicines should be approved by your health care provider.  You may urinate more often because the fetus is pressing on your bladder.  You may develop or continue to have heartburn as a result of your pregnancy.  You may develop constipation because certain hormones are causing the muscles that push waste through your intestines to slow down.  You may develop hemorrhoids or swollen, bulging veins (varicose veins).  You may have back pain. This is caused by: ? Weight gain. ? Pregnancy hormones that are relaxing the joints in your pelvis. ? A shift in weight and the muscles that support your balance.  Your breasts will continue  to grow and they will continue to become tender.  Your gums may bleed and may be sensitive to brushing and flossing.  Dark spots or blotches (chloasma, mask of pregnancy) may develop on your face. This will likely fade after the baby is born.  A dark line from your belly button to the pubic area (linea nigra) may appear. This will likely fade after the baby is born.  You may have changes in your hair. These can include thickening of your hair, rapid growth, and changes in texture. Some women also have hair loss during or after pregnancy, or  hair that feels dry or thin. Your hair will most likely return to normal after your baby is born.  What to expect at prenatal visits During a routine prenatal visit:  You will be weighed to make sure you and the fetus are growing normally.  Your blood pressure will be taken.  Your abdomen will be measured to track your baby's growth.  The fetal heartbeat will be listened to.  Any test results from the previous visit will be discussed.  Your health care provider may ask you:  How you are feeling.  If you are feeling the baby move.  If you have had any abnormal symptoms, such as leaking fluid, bleeding, severe headaches, or abdominal cramping.  If you are using any tobacco products, including cigarettes, chewing tobacco, and electronic cigarettes.  If you have any questions.  Other tests that may be performed during your second trimester include:  Blood tests that check for: ? Low iron levels (anemia). ? High blood sugar that affects pregnant women (gestational diabetes) between 52 and 28 weeks. ? Rh antibodies. This is to check for a protein on red blood cells (Rh factor).  Urine tests to check for infections, diabetes, or protein in the urine.  An ultrasound to confirm the proper growth and development of the baby.  An amniocentesis to check for possible genetic problems.  Fetal screens for spina bifida and Down syndrome.  HIV (human immunodeficiency virus) testing. Routine prenatal testing includes screening for HIV, unless you choose not to have this test.  Follow these instructions at home: Medicines  Follow your health care provider's instructions regarding medicine use. Specific medicines may be either safe or unsafe to take during pregnancy.  Take a prenatal vitamin that contains at least 600 micrograms (mcg) of folic acid.  If you develop constipation, try taking a stool softener if your health care provider approves. Eating and drinking  Eat a balanced  diet that includes fresh fruits and vegetables, whole grains, good sources of protein such as meat, eggs, or tofu, and low-fat dairy. Your health care provider will help you determine the amount of weight gain that is right for you.  Avoid raw meat and uncooked cheese. These carry germs that can cause birth defects in the baby.  If you have low calcium intake from food, talk to your health care provider about whether you should take a daily calcium supplement.  Limit foods that are high in fat and processed sugars, such as fried and sweet foods.  To prevent constipation: ? Drink enough fluid to keep your urine clear or pale yellow. ? Eat foods that are high in fiber, such as fresh fruits and vegetables, whole grains, and beans. Activity  Exercise only as directed by your health care provider. Most women can continue their usual exercise routine during pregnancy. Try to exercise for 30 minutes at least 5 days a week. Stop  exercising if you experience uterine contractions.  Avoid heavy lifting, wear low heel shoes, and practice good posture.  A sexual relationship may be continued unless your health care provider directs you otherwise. Relieving pain and discomfort  Wear a good support bra to prevent discomfort from breast tenderness.  Take warm sitz baths to soothe any pain or discomfort caused by hemorrhoids. Use hemorrhoid cream if your health care provider approves.  Rest with your legs elevated if you have leg cramps or low back pain.  If you develop varicose veins, wear support hose. Elevate your feet for 15 minutes, 3-4 times a day. Limit salt in your diet. Prenatal Care  Write down your questions. Take them to your prenatal visits.  Keep all your prenatal visits as told by your health care provider. This is important. Safety  Wear your seat belt at all times when driving.  Make a list of emergency phone numbers, including numbers for family, friends, the hospital, and police  and fire departments. General instructions  Ask your health care provider for a referral to a local prenatal education class. Begin classes no later than the beginning of month 6 of your pregnancy.  Ask for help if you have counseling or nutritional needs during pregnancy. Your health care provider can offer advice or refer you to specialists for help with various needs.  Do not use hot tubs, steam rooms, or saunas.  Do not douche or use tampons or scented sanitary pads.  Do not cross your legs for long periods of time.  Avoid cat litter boxes and soil used by cats. These carry germs that can cause birth defects in the baby and possibly loss of the fetus by miscarriage or stillbirth.  Avoid all smoking, herbs, alcohol, and unprescribed drugs. Chemicals in these products can affect the formation and growth of the baby.  Do not use any products that contain nicotine or tobacco, such as cigarettes and e-cigarettes. If you need help quitting, ask your health care provider.  Visit your dentist if you have not gone yet during your pregnancy. Use a soft toothbrush to brush your teeth and be gentle when you floss. Contact a health care provider if:  You have dizziness.  You have mild pelvic cramps, pelvic pressure, or nagging pain in the abdominal area.  You have persistent nausea, vomiting, or diarrhea.  You have a bad smelling vaginal discharge.  You have pain when you urinate. Get help right away if:  You have a fever.  You are leaking fluid from your vagina.  You have spotting or bleeding from your vagina.  You have severe abdominal cramping or pain.  You have rapid weight gain or weight loss.  You have shortness of breath with chest pain.  You notice sudden or extreme swelling of your face, hands, ankles, feet, or legs.  You have not felt your baby move in over an hour.  You have severe headaches that do not go away when you take medicine.  You have vision  changes. Summary  The second trimester is from week 14 through week 27 (months 4 through 6). It is also a time when the fetus is growing rapidly.  Your body goes through many changes during pregnancy. The changes vary from woman to woman.  Avoid all smoking, herbs, alcohol, and unprescribed drugs. These chemicals affect the formation and growth your baby.  Do not use any tobacco products, such as cigarettes, chewing tobacco, and e-cigarettes. If you need help quitting, ask your  health care provider.  Contact your health care provider if you have any questions. Keep all prenatal visits as told by your health care provider. This is important. This information is not intended to replace advice given to you by your health care provider. Make sure you discuss any questions you have with your health care provider. Document Released: 04/20/2001 Document Revised: 10/02/2015 Document Reviewed: 06/27/2012 Elsevier Interactive Patient Education  2017 [employer].

## 2017-05-13 NOTE — Progress Notes (Signed)
[name] 13+1 wks,measurements c/w dates,crl 68.56 mm,NB present,NT 1.3mm,fhr 164 bpm,normal ovaries bilat,anterior pl gr 0

## 2017-05-17 LAB — INTEGRATED 1
CROWN RUMP LENGTH MAT SCREEN: 68.6 mm
GEST. AGE ON COLLECTION DATE: 12.9 wk
Maternal Age at EDD: 19.7 yr
Nuchal Translucency (NT): 1.5 mm
Number of Fetuses: 1
PAPP-A Value: 849.5 ng/mL
WEIGHT: 171 [lb_av]

## 2017-05-20 ENCOUNTER — Ambulatory Visit: Payer: Medicaid Other | Admitting: Pediatrics

## 2017-05-20 ENCOUNTER — Telehealth: Payer: Self-pay | Admitting: Obstetrics & Gynecology

## 2017-05-20 NOTE — Telephone Encounter (Signed)
Pt called stating that she would like a call back from the nurse, pt states that both her eyes are very buffy. Pt would like to know what to do. Please contact pt

## 2017-05-20 NOTE — Telephone Encounter (Signed)
Pt called stating that she has been having some puffiness around her eyes, drainage, itching and burning. Advised pt to try taking Benadryl, Claritin, or zyrtec to see if that would help clear things up. She states that she was around her cousin who had pink eye so the pt started using "pink eye drops" to see if that would help. She states that it didn't. Advised pt to not use that anymore and to try saline drops instead to help with any irritation or burning. Pt also c/o white/pink discharge that started 3 days ago with vaginal itching and burning. Advised pt that it is possible she could have a yeast infection and she could try Monistat 7. Advised pt to go to urgent care or pcp if s/s worsened or didn't improve. Pt verbalized understanding.

## 2017-05-21 ENCOUNTER — Encounter (HOSPITAL_COMMUNITY): Payer: Self-pay

## 2017-05-21 ENCOUNTER — Other Ambulatory Visit: Payer: Self-pay

## 2017-05-21 ENCOUNTER — Emergency Department (HOSPITAL_COMMUNITY)
Admission: EM | Admit: 2017-05-21 | Discharge: 2017-05-21 | Disposition: A | Discharge status: Home / Self Care | Payer: Medicaid Other | Attending: Emergency Medicine | Admitting: Emergency Medicine

## 2017-05-21 DIAGNOSIS — Z87891 Personal history of nicotine dependence: Secondary | ICD-10-CM | POA: Diagnosis not present

## 2017-05-21 DIAGNOSIS — H579 Unspecified disorder of eye and adnexa: Secondary | ICD-10-CM | POA: Diagnosis present

## 2017-05-21 DIAGNOSIS — J4520 Mild intermittent asthma, uncomplicated: Secondary | ICD-10-CM | POA: Diagnosis not present

## 2017-05-21 DIAGNOSIS — H109 Unspecified conjunctivitis: Secondary | ICD-10-CM

## 2017-05-21 DIAGNOSIS — H1033 Unspecified acute conjunctivitis, bilateral: Secondary | ICD-10-CM | POA: Insufficient documentation

## 2017-05-21 MED ORDER — CROMOLYN SODIUM 4 % OP SOLN
1.0000 [drp] | Freq: Four times a day (QID) | OPHTHALMIC | 0 refills | Status: DC
Start: 2017-05-21 — End: 2017-06-13

## 2017-05-21 MED ORDER — CROMOLYN SODIUM 4 % OP SOLN: 1.0000 [drp] | Freq: Four times a day (QID) | OPHTHALMIC | 12 refills | Status: DC

## 2017-05-21 MED ORDER — FLUORESCEIN SODIUM 1 MG OP STRP
1.0000 | ORAL_STRIP | Freq: Once | OPHTHALMIC | Status: DC
  Filled 2017-05-21: qty 1

## 2017-05-21 MED ORDER — TETRACAINE HCL 0.5 % OP SOLN
2.0000 [drp] | Freq: Once | OPHTHALMIC | Status: DC
  Filled 2017-05-21: qty 4

## 2017-05-21 NOTE — ED Provider Notes (Signed)
[facility] EMERGENCY DEPARTMENT Provider Note   CSN: [id number] Arrival date & time: 05/21/17  1455     History   Chief Complaint Chief Complaint  Patient presents with  . Eye Problem    HPI Stacey Cook is a 22 y.o. female who is currently [redacted] weeks pregnant who presents to the emergency department today for eye redness.  Patient states over the last 1 week she has been having eye redness, with associated clear/white discharge and sensation of sandpaper when she blinks.  Patient notes that when she awakes in the morning that her eyes are crusted shut.  She admits to close contact with her cousin who was diagnosed with pinkeye last week.  She has been using saline drops for this without any relief.  She called her OB/GYN yesterday and was told to take Benadryl but noticed a rash that developed shortly after.  This is now resolved.  The patient denies fever, HA, N/V, loss of vision, changes in vision, flashers, floaters, diplopia, photophobia, FB sensation, trauma, eye pain or painful EOM.  Patient has not a contact lens wearer.  She denies any inability to control secretions, lip swelling, tongue swelling, difficulty swallowing or shortness of breath.  HPI  Past Medical History:  Diagnosis Date  . Abdominal pain   . ADD (attention deficit disorder)   . Anxiety   . Arthritis   . Asthma   . 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 Cervicalgia   . Chronic abdominal pain   . Constipation   . Depression   . Ear mass   . Headache   . HSV infection   . Suicidal intent   . UTI (lower urinary tract infection) 05/2014  . Vomiting     Patient Active Problem List   Diagnosis Date Noted  . Supervision of normal pregnancy 04/28/2017  . History of miscarriage 01/18/2017  . GBS bacteriuria 08/07/2016  . Conductive hearing loss, unilat, unrestrict hearing contralateral side 12/10/2015  . Severe episode of recurrent major depressive disorder, without psychotic  features ([place])   . High-risk sexual behavior   . 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 Asthma, mild intermittent 02/18/2015  . 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 Perforation of left tympanic membrane 06/19/2014  . Cervicalgia 12/20/2013  . Abdominal pain, chronic, epigastric 08/08/2013  . Acne 08/22/2012  . Syncope 08/16/2012  . Seasonal allergies 08/16/2012  . GAD (generalized anxiety disorder) 08/01/2012  . ADHD (attention deficit hyperactivity disorder), combined type 08/01/2012  . ODD (oppositional defiant disorder) 08/01/2012  . Vasovagal syncope 02/28/2012  . Cholesteatoma of attic of right ear 02/15/2011    Past Surgical History:  Procedure Laterality Date  . CHOLESTEATOMA EXCISION    . DILATION AND EVACUATION N/A 09/11/2016   Procedure: DILATATION AND CURRATAGE  2ND TRIMESTER;  Surgeon: Jonnie Kind, MD;  Location: [facility];  Service: Gynecology;  Laterality: N/A;  . IMPLANTATION BONE ANCHORED HEARING AID Right 04/2013  . 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 TYMPANOPLASTY Left   . TYMPANOSTOMY      OB History    Gravida Para Term Preterm AB Living   2 0 0 0 1 0   SAB TAB Ectopic Multiple Live Births   1 0 0 0 0       Home Medications    Prior to  Admission medications   Medication Sig Start Date End Date Taking? Authorizing Provider  albuterol (PROVENTIL) (2.5 MG/3ML) 0.083% nebulizer solution Take 3 mLs (2.5 mg total) by nebulization every 6 (six) hours as needed for wheezing or shortness of breath. 09/05/16   Virgel Manifold, MD  pantoprazole (PROTONIX) 20 MG tablet Take 1 tablet (20 mg total) by mouth daily. 05/13/17   Roma Schanz, CNM  prenatal vitamin w/FE, FA (PRENATAL 1 + 1) 27-1 MG TABS tablet Take 1 tablet by mouth daily at 12 noon. 01/18/17   Estill Dooms, NP  promethazine (PHENERGAN) 25 MG tablet  Take 1 tablet (25 mg total) by mouth every 6 (six) hours as needed for nausea or vomiting. 04/28/17   Christin Fudge, CNM    Family History Family History  Problem Relation Age of Onset  . Cholelithiasis Mother   . Kidney disease Mother        stones  . Depression Mother   . Hypertension Maternal Grandmother   . Diabetes Maternal Grandmother   . Stroke Maternal Grandmother   . Seizures Maternal Grandmother   . Asthma Maternal Grandmother   . Hyperlipidemia Maternal Grandmother   . Thyroid disease Maternal Grandmother   . Asthma Brother   . Seizures Maternal Uncle   . Cancer Other        breast- great aunt  . Celiac disease Neg Hx     Social History Social History   Tobacco Use  . Smoking status: Former Smoker    Packs/day: 0.00    Years: 1.00    Pack years: 0.00    Types: Cigarettes  . Smokeless tobacco: Never Used  . Tobacco comment: smokes 10 cig daily  Substance Use Topics  . Alcohol use: No    Alcohol/week: 0.0 oz  . Drug use: No     Allergies   Keflex [cephalexin]; Other; and Adhesive [tape]   Review of Systems Review of Systems  All other systems reviewed and are negative.    Physical Exam Updated Vital Signs BP 119/66 (BP Location: Left Arm)   Pulse 93   Temp 98.4 F (36.9 C) (Oral)   Resp 18   Ht 4\' 11"  (1.499 m)   Wt 77.6 kg (171 lb)   LMP 02/10/2017   SpO2 100%   BMI 34.54 kg/m   Physical Exam  Constitutional: She appears well-developed and well-nourished.  HENT:  Head: Normocephalic and atraumatic.  Right Ear: External ear and ear canal normal.  Left Ear: Tympanic membrane, external ear and ear canal normal.  Nose: Mucosal edema present.  Mouth/Throat: Uvula is midline, oropharynx is clear and moist and mucous membranes are normal. No tonsillar exudate.  The patient has normal phonation and is in control of secretions. No stridor.  Midline uvula without edema. Soft palate rises symmetrically.  No tonsillar erythema or  exudates. No PTA. Tongue protrusion is normal. No trismus. No creptius on neck palpation and patient has good dentition. No gingival erythema or fluctuance noted. Mucus membranes moist.  No angioedema  Eyes: Pupils are equal, round, and reactive to light. Right eye exhibits no discharge. Left eye exhibits no discharge. No scleral icterus.  Appearance there is bilateral conjunctival injection.  Clear discharge noted with some crusting on the eyelashes. PEERL intact. EOMI without nystagmus. No photophobia or consensual photophobia.  Corneal Abrasion Exam VCO. Risks, benefits and alternatives explained. 1 drops of tetracaine (PONTOCAINE) 0.5 % ophthalmic solutionwere applied to the both eyes. Fluorescein 1 MG ophthalmic strip applied the  the surface of the both eyes Wood's lamp used to screen for abrasion. No increased fluorescein uptake. No corneal ulcer. Negative Seidel sign. No foreign bodies noted. No visible hyphema.  Eye flushed with sterile saline Patient tolerated the procedure well TONOPEN: 18.9 LEFT, 20.8 RIGHT  Neck: Trachea normal. Neck supple. No spinous process tenderness present. No neck rigidity. Normal range of motion present.  Cardiovascular: Normal rate, regular rhythm and intact distal pulses.  No murmur heard. Pulses:      Radial pulses are 2+ on the right side, and 2+ on the left side.       Dorsalis pedis pulses are 2+ on the right side, and 2+ on the left side.       Posterior tibial pulses are 2+ on the right side, and 2+ on the left side.  No lower extremity swelling or edema. Calves symmetric in size bilaterally.  Pulmonary/Chest: Effort normal and breath sounds normal. She exhibits no tenderness.  Abdominal: Soft. Bowel sounds are normal. There is no tenderness. There is no rebound and no guarding.  Musculoskeletal: She exhibits no edema.  Lymphadenopathy:       Head (right side): No preauricular adenopathy present.       Head (left side): No preauricular adenopathy  present.    She has no cervical adenopathy.  Neurological: She is alert.  Skin: Skin is warm and dry. No rash noted. She is not diaphoretic.  Psychiatric: She has a normal mood and affect.  Nursing note and vitals reviewed.    ED Treatments / Results  Labs (all labs ordered are listed, but only abnormal results are displayed) Labs Reviewed - No data to display  EKG  EKG Interpretation None       Radiology No results found.  Procedures Procedures (including critical care time)  Medications Ordered in ED Medications  fluorescein ophthalmic strip 1 strip (not administered)  tetracaine (PONTOCAINE) 0.5 % ophthalmic solution 2 drop (not administered)     Initial Impression / Assessment and Plan / ED Course  I have reviewed the triage vital signs and the nursing notes.  Pertinent labs & imaging results that were available during my care of the patient were reviewed by me and considered in my medical decision making (see chart for details).       21 y.o. female presenting with eye redness, discharge and crusting. Pt dx likely conjunctivitis based on presentation & eye exam. No evidence of HSV or VSV infection. Pt is not a contact lens wearer.  Exam non-concerning for orbital cellulitis, hyphema, corneal ulcers, corneal abrasions or trauma.  Patient will be discharged home with eye drops for comfort.  Patient has been instructed to use cool compresses and practice personal hygiene with frequent hand washing.  Patient understands to follow up with ophthalmology. Strict return precautions discussed. Patient appears safe for discharge.   Final Clinical Impressions(s) / ED Diagnoses   Final diagnoses:  Conjunctivitis of both eyes, unspecified conjunctivitis type    ED Discharge Orders    None       Stacey Cook 05/21/17 1629    Daleen Bo, MD 05/22/17 1220

## 2017-05-21 NOTE — ED Notes (Signed)
Called patient for triage. Person in lobby informed Probation officer patient was in restroom.

## 2017-05-21 NOTE — ED Notes (Signed)
OS 20/25  OD 20/100  OU 20/25

## 2017-05-21 NOTE — ED Notes (Signed)
Pt reports reddened eyes for several weeks  Rash to face   Called her PCP who suggested she call OB Called OB office was told to take benadryl which she did Then she reports gagging coughing and swelling to her neck  Eyes reddened with L redder than r

## 2017-05-21 NOTE — ED Triage Notes (Signed)
Reports of redness/swelling to both eyes for several weeks. Also reports of rash to left side of face.

## 2017-05-21 NOTE — Discharge Instructions (Signed)
You were seen here today for eye redness. Your exam is consistent with conjunctivitis. I have attached a handout on this. Please use medications as prescribed. Please follow up with eye doctor in the next 48 hours.

## 2017-05-23 ENCOUNTER — Telehealth: Payer: Self-pay

## 2017-05-23 NOTE — Telephone Encounter (Signed)
Pt is on a list of [facility] ED visitors. Pt was called to the number [phone].Pt stated she is going to [facility] for all her medical needs since she is pregnant. Pt states she goes to [place] for her [place] needs and reports missing an appointment but that she will be rescheduling soon. Encouraged pt to do so. Pt states she does not work. Pt mentioned having some food insecurities. Was encouraged to apply for food stamps and also referred to the [employer]. Pt states she [place] [employer] and will look into it. Pt states she lives with her grandmother and her fiance lives with them.Voiced not no transportation issue since grandmother helps take her where she needs to go.  States she feels physically and emotionally safe.  Will call at a later date and time. Pt states she has already received the flu shot for 2018-19 at [facility].  Pt says she has regular medicaid and wants to get pregnancy medicaid. Told pt she will need to got to DSS for that and while she is there apply for food stamps while she is there. States she does get WIC. Before ending call pt asked if DSS helps with baby needs like a crib and car seat. Voiced to pt that I didn't know of any at this time but will find out and will get back to her. Will call pt when information has been gathered.    Brennan Litzinger R. [place], LPN [phone]

## 2017-05-24 ENCOUNTER — Telehealth: Payer: Self-pay

## 2017-05-24 NOTE — Telephone Encounter (Signed)
Spoke to patient yesterday 05/23/17 and pt asked if knew where she could get help with baby needs like a crib and a car seat.   Returned phone call today 05/24/17 to pt to give her information that I gathered:  Called the [employer] on [address] they said they are still giving out car seats but the person would have to qualify. Gave pt the contact number and address of the Fire Department. Told pt to call them Wednesday since a technician won't be available until then.  Another place I called was the [facility]. Was told they help making sure baby has their needs and that they have slots available. Gave pt contact number, and address as well.    Also told pt to call the [facility] office and ask if they give out free car seats as well as to look into the website [email or web address]  Pt was grateful of the information and said she will be contacting them shortly.    Tranae Laramie R. [place], LPN [phone]

## 2017-05-28 ENCOUNTER — Emergency Department (HOSPITAL_COMMUNITY)
Admission: EM | Admit: 2017-05-28 | Discharge: 2017-05-29 | Disposition: A | Discharge status: Home / Self Care | Payer: Medicaid Other | Attending: Emergency Medicine | Admitting: Emergency Medicine

## 2017-05-28 ENCOUNTER — Encounter (HOSPITAL_COMMUNITY): Payer: Self-pay | Admitting: *Deleted

## 2017-05-28 DIAGNOSIS — R1030 Lower abdominal pain, unspecified: Secondary | ICD-10-CM | POA: Diagnosis not present

## 2017-05-28 DIAGNOSIS — Z3402 Encounter for supervision of normal first pregnancy, second trimester: Secondary | ICD-10-CM | POA: Diagnosis not present

## 2017-05-28 DIAGNOSIS — O4690 Antepartum hemorrhage, unspecified, unspecified trimester: Secondary | ICD-10-CM

## 2017-05-28 DIAGNOSIS — O4692 Antepartum hemorrhage, unspecified, second trimester: Secondary | ICD-10-CM | POA: Insufficient documentation

## 2017-05-28 DIAGNOSIS — J45909 Unspecified asthma, uncomplicated: Secondary | ICD-10-CM | POA: Diagnosis not present

## 2017-05-28 DIAGNOSIS — Z87891 Personal history of nicotine dependence: Secondary | ICD-10-CM | POA: Insufficient documentation

## 2017-05-28 DIAGNOSIS — F902 Attention-deficit hyperactivity disorder, combined type: Secondary | ICD-10-CM | POA: Diagnosis not present

## 2017-05-28 DIAGNOSIS — Z3A15 15 weeks gestation of pregnancy: Secondary | ICD-10-CM | POA: Diagnosis not present

## 2017-05-28 LAB — URINALYSIS, ROUTINE W REFLEX MICROSCOPIC
Bilirubin Urine: NEGATIVE
Glucose, UA: NEGATIVE mg/dL
Hgb urine dipstick: NEGATIVE
KETONES UR: 5 mg/dL — AB
Nitrite: NEGATIVE
PH: 7 (ref 5.0–8.0)
Protein, ur: NEGATIVE mg/dL
Specific Gravity, Urine: 1.018 (ref 1.005–1.030)

## 2017-05-28 LAB — CBC WITH DIFFERENTIAL/PLATELET
BASOS PCT: 0 %
Basophils Absolute: 0 10*3/uL (ref 0.0–0.1)
EOS ABS: 0 10*3/uL (ref 0.0–0.7)
Eosinophils Relative: 0 %
HCT: 34.8 % — ABNORMAL LOW (ref 36.0–46.0)
HEMOGLOBIN: 11.8 g/dL — AB (ref 12.0–15.0)
Lymphocytes Relative: 23 %
Lymphs Abs: 2.3 10*3/uL (ref 0.7–4.0)
MCH: 29.4 pg (ref 26.0–34.0)
MCHC: 33.9 g/dL (ref 30.0–36.0)
MCV: 86.8 fL (ref 78.0–100.0)
MONO ABS: 0.8 10*3/uL (ref 0.1–1.0)
MONOS PCT: 8 %
NEUTROS PCT: 69 %
Neutro Abs: 7 10*3/uL (ref 1.7–7.7)
Platelets: 196 10*3/uL (ref 150–400)
RBC: 4.01 MIL/uL (ref 3.87–5.11)
RDW: 13.1 % (ref 11.5–15.5)
WBC: 10.1 10*3/uL (ref 4.0–10.5)

## 2017-05-28 LAB — WET PREP, GENITAL
Sperm: NONE SEEN
TRICH WET PREP: NONE SEEN
YEAST WET PREP: NONE SEEN

## 2017-05-28 LAB — BASIC METABOLIC PANEL
ANION GAP: 11 (ref 5–15)
BUN: 9 mg/dL (ref 6–20)
CO2: 21 mmol/L — ABNORMAL LOW (ref 22–32)
CREATININE: 0.45 mg/dL (ref 0.44–1.00)
Calcium: 9.4 mg/dL (ref 8.9–10.3)
Chloride: 106 mmol/L (ref 101–111)
GFR calc non Af Amer: >60 mL/min (ref >60)
Glucose, Bld: 94 mg/dL (ref 65–99)
Potassium: 3.7 mmol/L (ref 3.5–5.1)
SODIUM: 138 mmol/L (ref 135–145)

## 2017-05-28 LAB — HCG, QUANTITATIVE, PREGNANCY: hCG, Beta Chain, Quant, S: 29891 m[IU]/mL — ABNORMAL HIGH (ref <5)

## 2017-05-28 NOTE — ED Triage Notes (Signed)
Pt reports she is about [redacted] weeks pregnant and has been having some lower abdominal cramping. Tonight, when she went to the bathroom she had a brownish pink bleeding x1.

## 2017-05-28 NOTE — ED Provider Notes (Signed)
[facility] EMERGENCY DEPARTMENT Provider Note   CSN: [id number] Arrival date & time: 05/28/17  2152     History   Chief Complaint Chief Complaint  Patient presents with  . Vaginal Bleeding    HPI Stacey Cook is a 22 y.o. female.  HPI   Stacey Cook is a 22 y.o. female G2P0AB1 at [redacted] weeks gestation, presents to the Emergency Department complaining of intermittent lower abdominal cramping for several days.  She states approximately 30 minutes prior to ER arrival she noticed  "brownish pink" blood after voiding.  She states that it appeared as blood in the toilet and on the tissue after wiping.  She states this was a single episode and she has not had continued bleeding.  She denies complications with her current pregnancy, she has a follow-up OB appointment on 06/03/2016.  She denies dysuria, vaginal discharge, itching, fever, chills, vomiting, chest pain or shortness of breath.  She is taking prenatals but admits to not taking them consistently.   Past Medical History:  Diagnosis Date  . Abdominal pain   . ADD (attention deficit disorder)   . Anxiety   . Arthritis   . Asthma   . 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 Cervicalgia   . Chronic abdominal pain   . Constipation   . Depression   . Ear mass   . Headache   . HSV infection   . Suicidal intent   . UTI (lower urinary tract infection) 05/2014  . Vomiting     Patient Active Problem List   Diagnosis Date Noted  . Supervision of normal pregnancy 04/28/2017  . History of miscarriage 01/18/2017  . GBS bacteriuria 08/07/2016  . Conductive hearing loss, unilat, unrestrict hearing contralateral side 12/10/2015  . Severe episode of recurrent major depressive disorder, without psychotic features ([place])   . High-risk sexual behavior   . 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 Asthma, mild intermittent 02/18/2015  . 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 Perforation of left tympanic membrane 06/19/2014  . Cervicalgia 12/20/2013  . Abdominal pain, chronic, epigastric 08/08/2013  . Acne 08/22/2012  . Syncope 08/16/2012  . Seasonal allergies 08/16/2012  . GAD (generalized anxiety disorder) 08/01/2012  . ADHD (attention deficit hyperactivity disorder), combined type 08/01/2012  . ODD (oppositional defiant disorder) 08/01/2012  . Vasovagal syncope 02/28/2012  . Cholesteatoma of attic of right ear 02/15/2011    Past Surgical History:  Procedure Laterality Date  . CHOLESTEATOMA EXCISION    . DILATION AND EVACUATION N/A 09/11/2016   Procedure: DILATATION AND CURRATAGE  2ND TRIMESTER;  Surgeon: Jonnie Kind, MD;  Location: [facility];  Service: Gynecology;  Laterality: N/A;  . IMPLANTATION BONE ANCHORED HEARING AID Right 04/2013  . 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 TYMPANOPLASTY Left   . TYMPANOSTOMY      OB History    Gravida Para Term Preterm AB Living   2 0 0 0 1 0   SAB TAB Ectopic Multiple Live Births   1 0 0 0 0       Home Medications    Prior to Admission medications   Medication Sig Start Date End Date Taking? Authorizing Provider  albuterol (PROVENTIL) (2.5 MG/3ML) 0.083% nebulizer solution Take 3 mLs (2.5 mg total) by nebulization every 6 (six) hours as needed for wheezing  or shortness of breath. 09/05/16  Yes Virgel Manifold, MD  Olopatadine HCl (PATADAY) 0.2 % SOLN Apply 1 drop to eye daily as needed (for allergies).   Yes [provider]  pantoprazole (PROTONIX) 20 MG tablet Take 1 tablet (20 mg total) by mouth daily. 05/13/17  Yes Roma Schanz, CNM  prenatal vitamin w/FE, FA (PRENATAL 1 + 1) 27-1 MG TABS tablet Take 1 tablet by mouth daily at 12 noon. 01/18/17  Yes Estill Dooms, NP  promethazine (PHENERGAN) 25 MG tablet Take 1 tablet (25 mg total) by mouth every 6 (six) hours as needed for nausea  or vomiting. 04/28/17  Yes Cresenzo-Dishmon, Joaquim Lai, CNM  cromolyn (OPTICROM) 4 % ophthalmic solution Place 1 drop into both eyes 4 (four) times daily. Patient not taking: Reported on 05/28/2017 05/21/17   Maczis, Barth Kirks, PA-C    Family History Family History  Problem Relation Age of Onset  . Cholelithiasis Mother   . Kidney disease Mother        stones  . Depression Mother   . Hypertension Maternal Grandmother   . Diabetes Maternal Grandmother   . Stroke Maternal Grandmother   . Seizures Maternal Grandmother   . Asthma Maternal Grandmother   . Hyperlipidemia Maternal Grandmother   . Thyroid disease Maternal Grandmother   . Asthma Brother   . Seizures Maternal Uncle   . Cancer Other        breast- great aunt  . Celiac disease Neg Hx     Social History Social History   Tobacco Use  . Smoking status: Former Smoker    Packs/day: 0.00    Years: 1.00    Pack years: 0.00    Types: Cigarettes  . Smokeless tobacco: Never Used  . Tobacco comment: smokes 10 cig daily  Substance Use Topics  . Alcohol use: No    Alcohol/week: 0.0 oz  . Drug use: No     Allergies   Keflex [cephalexin]; Other; and Adhesive [tape]   Review of Systems Review of Systems  Constitutional: Negative for appetite change, chills and fever.  Respiratory: Negative for shortness of breath.   Cardiovascular: Negative for chest pain.  Gastrointestinal: Positive for abdominal pain ("cramping"). Negative for blood in stool, nausea and vomiting.  Genitourinary: Positive for vaginal bleeding. Negative for decreased urine volume, difficulty urinating, dysuria and flank pain.  Musculoskeletal: Negative for back pain.  Skin: Negative for color change and rash.  Neurological: Negative for dizziness, weakness and numbness.  Hematological: Negative for adenopathy.  All other systems reviewed and are negative.    Physical Exam Updated Vital Signs BP 119/69   Pulse 90   Temp 98.2 F (36.8 C) (Oral)    Resp 16   Ht 4\' 11"  (1.499 m)   Wt 77.6 kg (171 lb)   LMP 02/10/2017   SpO2 100%   BMI 34.54 kg/m   Physical Exam  Constitutional: She is oriented to person, place, and time. She appears well-developed and well-nourished. No distress.  HENT:  Mouth/Throat: Oropharynx is clear and moist.  Cardiovascular: Normal rate and regular rhythm.  No murmur heard. Pulmonary/Chest: Effort normal and breath sounds normal. No respiratory distress.  Abdominal: Soft. There is tenderness.  Mild tenderness to palpation of the lower abdomen.  No guarding.  Pt is gravid.   Genitourinary: Uterus is enlarged. Right adnexum displays no mass. Left adnexum displays no mass and no tenderness. No bleeding in the vagina. Vaginal discharge found.  Genitourinary Comments: Mild white to yellow  vaginal discharge, cervical os is closed.  No vaginal bleeding.  No adnexal masses  Musculoskeletal: Normal range of motion. She exhibits no edema.  Neurological: She is alert and oriented to person, place, and time.  Skin: Skin is warm. No rash noted.  Psychiatric: She has a normal mood and affect.  Nursing note and vitals reviewed.    ED Treatments / Results  Labs (all labs ordered are listed, but only abnormal results are displayed) Labs Reviewed  WET PREP, GENITAL - Abnormal; Notable for the following components:      Result Value   Clue Cells Wet Prep HPF POC PRESENT (*)    WBC, Wet Prep HPF POC MANY (*)    All other components within normal limits  URINALYSIS, ROUTINE W REFLEX MICROSCOPIC - Abnormal; Notable for the following components:   APPearance CLOUDY (*)    Ketones, ur 5 (*)    Leukocytes, UA SMALL (*)    Bacteria, UA MANY (*)    Squamous Epithelial / LPF 6-30 (*)    All other components within normal limits  CBC WITH DIFFERENTIAL/PLATELET - Abnormal; Notable for the following components:   Hemoglobin 11.8 (*)    HCT 34.8 (*)    All other components within normal limits  BASIC METABOLIC PANEL -  Abnormal; Notable for the following components:   CO2 21 (*)    All other components within normal limits  HCG, QUANTITATIVE, PREGNANCY - Abnormal; Notable for the following components:   hCG, Beta Chain, Quant, S 29,891 (*)    All other components within normal limits  URINE CULTURE  GC/CHLAMYDIA PROBE AMP () NOT AT [facility]    EKG  EKG Interpretation None       Radiology No results found.  Procedures Procedures (including critical care time)  Medications Ordered in ED Medications - No data to display   Initial Impression / Assessment and Plan / ED Course  I have reviewed the triage vital signs and the nursing notes.  Pertinent labs & imaging results that were available during my care of the patient were reviewed by me and considered in my medical decision making (see chart for details).     Pt is well appearing.  No active vaginal bleeding on my exam.   FHT with portable doppler 140's.   Bedside ultrasound performed by Dr. Hazle Nordmann.    Urine culture pending.  Pt appears stable for d/c home.  Agrees to OB f/u this week.    Final Clinical Impressions(s) / ED Diagnoses   Final diagnoses:  Vaginal bleeding in pregnancy    ED Discharge Orders    None       Kem Parkinson, PA-C 05/29/17 0039    Noemi Chapel, MD 05/29/17 [phone]

## 2017-05-28 NOTE — ED Notes (Signed)
Asked pt for urine sample,pt states just went will try back in 30 minutes

## 2017-05-29 MED ORDER — ACETAMINOPHEN 500 MG PO TABS
500.0000 mg | ORAL_TABLET | Freq: Once | ORAL | Status: AC
  Administered 2017-05-29: 500 mg via ORAL
  Filled 2017-05-29: qty 1

## 2017-05-29 NOTE — ED Provider Notes (Signed)
The patient is a 22 year old female, G2 P1, first baby was a miscarriage, this pregnancy is at 15 weeks, patient has had mild abdominal cramping with some brown discharge, on exam she has a soft nontender abdomen, bedside ultrasound shows that she has a normal-appearing fetus with normal heart tones of 140 bpm with good movements.  Medical screening examination/treatment/procedure(s) were conducted as a shared visit with non-physician practitioner(s) and myself.  I personally evaluated the patient during the encounter.  Clinical Impression:   Final diagnoses:  None      Anticipate follow-up with OB/GYN   Noemi Chapel, MD 05/29/17 [phone]

## 2017-05-29 NOTE — Discharge Instructions (Signed)
[facility].  Take your prenatal vitamins as directed.

## 2017-05-30 LAB — URINE CULTURE: Culture: <10000 — AB

## 2017-05-30 LAB — GC/CHLAMYDIA PROBE AMP (~~LOC~~) NOT AT ARMC
Chlamydia: NEGATIVE
Neisseria Gonorrhea: NEGATIVE

## 2017-06-03 ENCOUNTER — Ambulatory Visit (INDEPENDENT_AMBULATORY_CARE_PROVIDER_SITE_OTHER): Payer: Medicaid Other | Admitting: Obstetrics & Gynecology

## 2017-06-03 ENCOUNTER — Encounter: Payer: Self-pay | Admitting: Obstetrics & Gynecology

## 2017-06-03 VITALS — BP 110/70 | HR 103 | Wt 170.0 lb

## 2017-06-03 DIAGNOSIS — Z1379 Encounter for other screening for genetic and chromosomal anomalies: Secondary | ICD-10-CM

## 2017-06-03 DIAGNOSIS — Z3A16 16 weeks gestation of pregnancy: Secondary | ICD-10-CM

## 2017-06-03 DIAGNOSIS — Z1389 Encounter for screening for other disorder: Secondary | ICD-10-CM

## 2017-06-03 DIAGNOSIS — Z3402 Encounter for supervision of normal first pregnancy, second trimester: Secondary | ICD-10-CM

## 2017-06-03 DIAGNOSIS — Z331 Pregnant state, incidental: Secondary | ICD-10-CM

## 2017-06-03 LAB — POCT URINALYSIS DIPSTICK
Glucose, UA: NEGATIVE
Ketones, UA: NEGATIVE
NITRITE UA: NEGATIVE
PROTEIN UA: NEGATIVE
RBC UA: NEGATIVE

## 2017-06-03 NOTE — Progress Notes (Signed)
G2P0010 [redacted]w[redacted]d Estimated Date of Delivery: 11/17/17  Blood pressure 110/70, pulse (!) 103, weight 170 lb (77.1 kg), last menstrual period 02/10/2017.   BP weight and urine results all reviewed and noted.  Please refer to the obstetrical flow sheet for the fundal height and fetal heart rate documentation:  Patient reports good fetal movement, denies any bleeding and no rupture of membranes symptoms or regular contractions. Patient is without complaints. All questions were answered.  Orders Placed This Encounter  Procedures  . US OB Comp + 14 Wk  . INTEGRATED 2  . POCT urinalysis dipstick     Wet prep is negative Normal WBC, no yeast, no BV no trichomonas Cervix LTC firm Plan:  Continued routine obstetrical care, sonogram at next visit  Return in about 3 weeks (around 06/24/2017) for 20 week sono, LROB.

## 2017-06-07 ENCOUNTER — Other Ambulatory Visit: Payer: Self-pay | Admitting: Advanced Practice Midwife

## 2017-06-10 LAB — INTEGRATED 2
AFP MARKER: 43.1 ng/mL
AFP MoM: 1.6
Crown Rump Length: 68.6 mm
DIA MOM: 0.75
DIA Value: 120.1 pg/mL
ESTRIOL UNCONJUGATED: 0.9 ng/mL
GEST. AGE ON COLLECTION DATE: 12.9 wk
GESTATIONAL AGE: 15.9 wk
HCG MOM: 0.86
Maternal Age at EDD: 19.7 yr
NUCHAL TRANSLUCENCY (NT): 1.5 mm
NUCHAL TRANSLUCENCY MOM: 0.88
NUMBER OF FETUSES: 1
PAPP-A MoM: 0.88
PAPP-A Value: 849.5 ng/mL
Test Results:: NEGATIVE
Weight: 171 [lb_av]
Weight: 171 [lb_av]
hCG Value: 28.4 IU/mL
uE3 MoM: 1.22

## 2017-06-12 ENCOUNTER — Other Ambulatory Visit: Payer: Self-pay

## 2017-06-12 ENCOUNTER — Emergency Department (HOSPITAL_COMMUNITY)
Admission: EM | Admit: 2017-06-12 | Discharge: 2017-06-13 | Disposition: A | Discharge status: Home / Self Care | Payer: Medicaid Other | Attending: Emergency Medicine | Admitting: Emergency Medicine

## 2017-06-12 ENCOUNTER — Encounter (HOSPITAL_COMMUNITY): Payer: Self-pay | Admitting: Emergency Medicine

## 2017-06-12 DIAGNOSIS — Z79899 Other long term (current) drug therapy: Secondary | ICD-10-CM | POA: Insufficient documentation

## 2017-06-12 DIAGNOSIS — J45909 Unspecified asthma, uncomplicated: Secondary | ICD-10-CM | POA: Insufficient documentation

## 2017-06-12 DIAGNOSIS — Z87891 Personal history of nicotine dependence: Secondary | ICD-10-CM | POA: Diagnosis not present

## 2017-06-12 DIAGNOSIS — O99512 Diseases of the respiratory system complicating pregnancy, second trimester: Secondary | ICD-10-CM | POA: Diagnosis not present

## 2017-06-12 DIAGNOSIS — O219 Vomiting of pregnancy, unspecified: Secondary | ICD-10-CM | POA: Insufficient documentation

## 2017-06-12 DIAGNOSIS — Z3A17 17 weeks gestation of pregnancy: Secondary | ICD-10-CM | POA: Insufficient documentation

## 2017-06-12 LAB — CBC
HCT: 36.2 % (ref 36.0–46.0)
Hemoglobin: 12 g/dL (ref 12.0–15.0)
MCH: 29.1 pg (ref 26.0–34.0)
MCHC: 33.1 g/dL (ref 30.0–36.0)
MCV: 87.7 fL (ref 78.0–100.0)
PLATELETS: 189 10*3/uL (ref 150–400)
RBC: 4.13 MIL/uL (ref 3.87–5.11)
RDW: 13.6 % (ref 11.5–15.5)
WBC: 11 10*3/uL — AB (ref 4.0–10.5)

## 2017-06-12 LAB — COMPREHENSIVE METABOLIC PANEL
ALK PHOS: 98 U/L (ref 38–126)
ALT: 21 U/L (ref 14–54)
AST: 22 U/L (ref 15–41)
Albumin: 3.5 g/dL (ref 3.5–5.0)
Anion gap: 13 (ref 5–15)
BUN: 6 mg/dL (ref 6–20)
CHLORIDE: 102 mmol/L (ref 101–111)
CO2: 19 mmol/L — ABNORMAL LOW (ref 22–32)
CREATININE: 0.43 mg/dL — AB (ref 0.44–1.00)
Calcium: 9.5 mg/dL (ref 8.9–10.3)
GFR calc Af Amer: >60 mL/min (ref >60)
GFR calc non Af Amer: >60 mL/min (ref >60)
Glucose, Bld: 96 mg/dL (ref 65–99)
Potassium: 3.9 mmol/L (ref 3.5–5.1)
Sodium: 134 mmol/L — ABNORMAL LOW (ref 135–145)
Total Bilirubin: 0.4 mg/dL (ref 0.3–1.2)
Total Protein: 7.5 g/dL (ref 6.5–8.1)

## 2017-06-12 LAB — LIPASE, BLOOD: LIPASE: 24 U/L (ref 11–51)

## 2017-06-12 MED ORDER — ONDANSETRON HCL 4 MG/2ML IJ SOLN
4.0000 mg | Freq: Once | INTRAMUSCULAR | Status: AC
  Administered 2017-06-12: 4 mg via INTRAVENOUS
  Filled 2017-06-12: qty 2

## 2017-06-12 MED ORDER — SODIUM CHLORIDE 0.9 % IV BOLUS (SEPSIS)
1000.0000 mL | Freq: Once | INTRAVENOUS | Status: AC
  Administered 2017-06-12: 1000 mL via INTRAVENOUS

## 2017-06-12 NOTE — ED Triage Notes (Signed)
Patient reports emesis that started 2 days ago. Has not been able to keep anything down. Patient states she is [redacted] weeks pregnant. C/O lower abdominal pain that started today. No bleeding. Patient reports decreased UOP today.

## 2017-06-12 NOTE — ED Provider Notes (Signed)
[facility] EMERGENCY DEPARTMENT Provider Note   CSN: [id number] Arrival date & time: 06/12/17  2045     History   Chief Complaint Chief Complaint  Patient presents with  . Emesis    HPI Stacey Cook is a 22 y.o. female.  HPI   Stacey Cook is a 22 y.o. female G2 P0 with miscarriage of first pregnancy, at [redacted] weeks gestation, who presents to the Emergency Department complaining of vomiting for 2 days.  States she is unable to keep down any food or liquids.  She has been taking phenergan w/o relief.  She also complains of cramping pain across her abdomen associated with movement only.  No vaginal bleeding.  Has not felt fetal movement today and notes decreased urine out put for roughly 12 hrs.  She also reports having some nasal congestion for few days which she attributes to allergies.  She denies dysuria, diarrhea, fever, vaginal bleeding or discharge, chest pain and shortness of breath.  Past Medical History:  Diagnosis Date  . Abdominal pain   . ADD (attention deficit disorder)   . Anxiety   . Arthritis   . Asthma   . 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 Cervicalgia   . Chronic abdominal pain   . Constipation   . Depression   . Ear mass   . Headache   . HSV infection   . Suicidal intent   . UTI (lower urinary tract infection) 05/2014  . Vomiting     Patient Active Problem List   Diagnosis Date Noted  . Supervision of normal pregnancy 04/28/2017  . History of miscarriage 01/18/2017  . GBS bacteriuria 08/07/2016  . Conductive hearing loss, unilat, unrestrict hearing contralateral side 12/10/2015  . Severe episode of recurrent major depressive disorder, without psychotic features ([place])   . High-risk sexual behavior   . 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 Asthma, mild intermittent 02/18/2015  . 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 Status post placement of bone anchored hearing  aid (BAHA) 06/20/2014  . Perforation of left tympanic membrane 06/19/2014  . Cervicalgia 12/20/2013  . Abdominal pain, chronic, epigastric 08/08/2013  . Acne 08/22/2012  . Syncope 08/16/2012  . Seasonal allergies 08/16/2012  . GAD (generalized anxiety disorder) 08/01/2012  . ADHD (attention deficit hyperactivity disorder), combined type 08/01/2012  . ODD (oppositional defiant disorder) 08/01/2012  . Vasovagal syncope 02/28/2012  . Cholesteatoma of attic of right ear 02/15/2011    Past Surgical History:  Procedure Laterality Date  . CHOLESTEATOMA EXCISION    . DILATION AND EVACUATION N/A 09/11/2016   Procedure: DILATATION AND CURRATAGE  2ND TRIMESTER;  Surgeon: Jonnie Kind, MD;  Location: [facility] [facility];  Service: Gynecology;  Laterality: N/A;  . IMPLANTATION BONE ANCHORED HEARING AID Right 04/2013  . 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 TYMPANOPLASTY Left   . TYMPANOSTOMY      OB History    Gravida Para Term Preterm AB Living   2 0 0 0 1 0   SAB TAB Ectopic Multiple Live Births   1 0 0 0 0       Home Medications    Prior to Admission medications   Medication Sig Start Date End Date Taking? Authorizing Provider  albuterol (PROVENTIL) (2.5 MG/3ML) 0.083% nebulizer solution Take 3 mLs (2.5 mg total) by nebulization every 6 (six) hours as needed for wheezing or shortness of breath. Patient  not taking: Reported on 06/03/2017 09/05/16   Virgel Manifold, MD  cromolyn (OPTICROM) 4 % ophthalmic solution Place 1 drop into both eyes 4 (four) times daily. Patient not taking: Reported on 05/28/2017 05/21/17   Maczis, Barth Kirks, PA-C  Olopatadine HCl (PATADAY) 0.2 % SOLN Apply 1 drop to eye daily as needed (for allergies).    [provider]  pantoprazole (PROTONIX) 20 MG tablet Take 1 tablet (20 mg total) by mouth daily. 05/13/17   Roma Schanz, CNM  prenatal vitamin w/FE, FA (PRENATAL 1 + 1) 27-1 MG TABS tablet Take 1 tablet by mouth daily at 12 noon. 01/18/17    Estill Dooms, NP  promethazine (PHENERGAN) 25 MG tablet Take 1 tablet (25 mg total) by mouth every 6 (six) hours as needed for nausea or vomiting. 04/28/17   Cresenzo-Dishmon, Joaquim Lai, CNM  promethazine (PHENERGAN) 25 MG tablet TAKE 1 TABLET BY MOUTH EVERY 6 HOURS AS NEEDED. 06/07/17   Christin Fudge, CNM    Family History Family History  Problem Relation Age of Onset  . Cholelithiasis Mother   . Kidney disease Mother        stones  . Depression Mother   . Hypertension Maternal Grandmother   . Diabetes Maternal Grandmother   . Stroke Maternal Grandmother   . Seizures Maternal Grandmother   . Asthma Maternal Grandmother   . Hyperlipidemia Maternal Grandmother   . Thyroid disease Maternal Grandmother   . Asthma Brother   . Seizures Maternal Uncle   . Cancer Other        breast- great aunt  . Celiac disease Neg Hx     Social History Social History   Tobacco Use  . Smoking status: Former Smoker    Packs/day: 0.00    Years: 1.00    Pack years: 0.00    Types: Cigarettes  . Smokeless tobacco: Never Used  . Tobacco comment: smokes 10 cig daily  Substance Use Topics  . Alcohol use: No    Alcohol/week: 0.0 oz  . Drug use: No     Allergies   Keflex [cephalexin]; Other; and Adhesive [tape]   Review of Systems Review of Systems  Constitutional: Negative for appetite change, chills and fever.  HENT: Positive for congestion.   Respiratory: Negative for shortness of breath.   Cardiovascular: Negative for chest pain.  Gastrointestinal: Positive for abdominal pain (with movement only), nausea and vomiting. Negative for diarrhea.  Genitourinary: Positive for decreased urine volume. Negative for difficulty urinating, dysuria and hematuria.  Musculoskeletal: Negative for back pain.  Skin: Negative for color change and rash.  Neurological: Negative for dizziness, weakness and numbness.  Hematological: Negative for adenopathy.  All other systems reviewed and are  negative.    Physical Exam Updated Vital Signs BP 100/68 (BP Location: Right Arm)   Pulse (!) 119   Temp 97.9 F (36.6 C) (Oral)   Resp 16   Ht 4\' 11"  (1.499 m)   Wt 77.1 kg (170 lb)   LMP 02/10/2017   SpO2 97%   BMI 34.34 kg/m   Physical Exam  Constitutional: She is oriented to person, place, and time. She appears well-developed and well-nourished. No distress.  HENT:  Head: Atraumatic.  Right Ear: Tympanic membrane and ear canal normal.  Left Ear: Tympanic membrane and ear canal normal.  Nose: No rhinorrhea.  Mouth/Throat: Oropharynx is clear and moist.  Mucous membranes are moist.  Eyes: Conjunctivae are normal. Pupils are equal, round, and reactive to light.  Neck: Normal  range of motion. Neck supple.  Cardiovascular: Normal rate, regular rhythm and intact distal pulses.  Pulmonary/Chest: Effort normal and breath sounds normal. No respiratory distress.  Abdominal: Soft. There is no tenderness. There is no guarding.  abd is gravid.    Musculoskeletal: Normal range of motion. She exhibits no edema.  No peripheral edema  Lymphadenopathy:    She has no cervical adenopathy.  Neurological: She is alert and oriented to person, place, and time. No sensory deficit.  Skin: Skin is warm. Capillary refill takes less than 2 seconds. No rash noted.  Psychiatric: She has a normal mood and affect.  Nursing note and vitals reviewed.    ED Treatments / Results  Labs (all labs ordered are listed, but only abnormal results are displayed) Labs Reviewed  COMPREHENSIVE METABOLIC PANEL - Abnormal; Notable for the following components:      Result Value   Sodium 134 (*)    CO2 19 (*)    Creatinine, Ser 0.43 (*)    All other components within normal limits  CBC - Abnormal; Notable for the following components:   WBC 11.0 (*)    All other components within normal limits  LIPASE, BLOOD  URINALYSIS, ROUTINE W REFLEX MICROSCOPIC    EKG  EKG Interpretation None        Radiology No results found.  Procedures Procedures (including critical care time)  Medications Ordered in ED Medications  sodium chloride 0.9 % bolus 1,000 mL (not administered)  ondansetron (ZOFRAN) injection 4 mg (not administered)     Initial Impression / Assessment and Plan / ED Course  I have reviewed the triage vital signs and the nursing notes.  Pertinent labs & imaging results that were available during my care of the patient were reviewed by me and considered in my medical decision making (see chart for details).    Pt seen by OB on 06/03/17 with routine exam.  Pt to have sonogram at next visit on 2/15  Pt normotensive after placement of correct BP cuff.  FHT's 61  Pt verbalizes understanding that pregnancy is nonviable at this gestational age and that sx's may still be related to threaten miscarriage.  Will arrange for pt to return here tomorrow for outpatient [name] and contact # provided.  Will switch her from po to phenergan suppositories.    0210  Pt feeling better, tolerating po.  Sitting up on stretcher and talking on cell phone.   Final Clinical Impressions(s) / ED Diagnoses   Final diagnoses:  Nausea and vomiting in pregnancy    ED Discharge Orders    None       Kem Parkinson, PA-C 06/13/17 0216    Rolland Porter, MD 06/13/17 [phone]

## 2017-06-13 ENCOUNTER — Ambulatory Visit (INDEPENDENT_AMBULATORY_CARE_PROVIDER_SITE_OTHER): Payer: Medicaid Other | Admitting: Women's Health

## 2017-06-13 ENCOUNTER — Encounter: Payer: Self-pay | Admitting: Women's Health

## 2017-06-13 VITALS — BP 126/74 | HR 124 | Wt 171.0 lb

## 2017-06-13 DIAGNOSIS — Z3482 Encounter for supervision of other normal pregnancy, second trimester: Secondary | ICD-10-CM

## 2017-06-13 DIAGNOSIS — Z1389 Encounter for screening for other disorder: Secondary | ICD-10-CM

## 2017-06-13 DIAGNOSIS — Z3A17 17 weeks gestation of pregnancy: Secondary | ICD-10-CM

## 2017-06-13 DIAGNOSIS — Z331 Pregnant state, incidental: Secondary | ICD-10-CM

## 2017-06-13 DIAGNOSIS — O368120 Decreased fetal movements, second trimester, not applicable or unspecified: Secondary | ICD-10-CM

## 2017-06-13 DIAGNOSIS — O219 Vomiting of pregnancy, unspecified: Secondary | ICD-10-CM

## 2017-06-13 DIAGNOSIS — O99342 Other mental disorders complicating pregnancy, second trimester: Secondary | ICD-10-CM

## 2017-06-13 DIAGNOSIS — F418 Other specified anxiety disorders: Secondary | ICD-10-CM | POA: Insufficient documentation

## 2017-06-13 LAB — URINALYSIS, ROUTINE W REFLEX MICROSCOPIC
BILIRUBIN URINE: NEGATIVE
Glucose, UA: NEGATIVE mg/dL
Hgb urine dipstick: NEGATIVE
Ketones, ur: 20 mg/dL — AB
Nitrite: NEGATIVE
Protein, ur: NEGATIVE mg/dL
SPECIFIC GRAVITY, URINE: 1.01 (ref 1.005–1.030)
pH: 7 (ref 5.0–8.0)

## 2017-06-13 LAB — POCT URINALYSIS DIPSTICK
Glucose, UA: NEGATIVE
Ketones, UA: NEGATIVE
NITRITE UA: NEGATIVE
RBC UA: NEGATIVE

## 2017-06-13 MED ORDER — PROMETHAZINE HCL 25 MG RE SUPP: 25.0000 mg | Freq: Four times a day (QID) | RECTAL | 0 refills | Status: DC | PRN

## 2017-06-13 MED ORDER — SERTRALINE HCL 25 MG PO TABS: 25.0000 mg | ORAL_TABLET | Freq: Every day | ORAL | 6 refills | Status: DC

## 2017-06-13 MED ORDER — ACETAMINOPHEN 650 MG RE SUPP
650.0000 mg | Freq: Once | RECTAL | Status: AC
  Administered 2017-06-13: 650 mg via RECTAL
  Filled 2017-06-13: qty 1

## 2017-06-13 MED ORDER — METOCLOPRAMIDE HCL 5 MG/ML IJ SOLN
10.0000 mg | Freq: Once | INTRAMUSCULAR | Status: AC
  Administered 2017-06-13: 10 mg via INTRAVENOUS
  Filled 2017-06-13: qty 2

## 2017-06-13 NOTE — ED Notes (Addendum)
Pt reports continued N  Stats she had a flu shot

## 2017-06-13 NOTE — ED Notes (Signed)
FHR 152

## 2017-06-13 NOTE — Patient Instructions (Addendum)
Humidifier and saline nasal spray for nasal congestion  Regular robitussin, cough drops for cough  Warm salt water gargles for sore throat  Mucinex with lots of water to help you cough up the mucous in your chest if needed  Drink plenty of fluids and stay hydrated!  Wash your hands frequently.  Call if you are not improving by 7-10 days.   Stacey Cook, I greatly value your feedback.  If you receive a survey following your visit with [name] today, we appreciate you taking the time to fill it out.  Thanks, [name], CNM, WHNP-BC   Second Trimester of Pregnancy The second trimester is from week 14 through week 27 (months 4 through 6). The second trimester is often a time when you feel your best. Your body has adjusted to being pregnant, and you begin to feel better physically. Usually, morning sickness has lessened or quit completely, you may have more energy, and you may have an increase in appetite. The second trimester is also a time when the fetus is growing rapidly. At the end of the sixth month, the fetus is about 9 inches long and weighs about 1 pounds. You will likely begin to feel the baby move (quickening) between 16 and 20 weeks of pregnancy. Body changes during your second trimester Your body continues to go through many changes during your second trimester. The changes vary from woman to woman.  Your weight will continue to increase. You will notice your lower abdomen bulging out.  You may begin to get stretch marks on your hips, abdomen, and breasts.  You may develop headaches that can be relieved by medicines. The medicines should be approved by your health care provider.  You may urinate more often because the fetus is pressing on your bladder.  You may develop or continue to have heartburn as a result of your pregnancy.  You may develop constipation because certain hormones are causing the muscles that push waste through your intestines to slow down.  You may  develop hemorrhoids or swollen, bulging veins (varicose veins).  You may have back pain. This is caused by: ? Weight gain. ? Pregnancy hormones that are relaxing the joints in your pelvis. ? A shift in weight and the muscles that support your balance.  Your breasts will continue to grow and they will continue to become tender.  Your gums may bleed and may be sensitive to brushing and flossing.  Dark spots or blotches (chloasma, mask of pregnancy) may develop on your face. This will likely fade after the baby is born.  A dark line from your belly button to the pubic area (linea nigra) may appear. This will likely fade after the baby is born.  You may have changes in your hair. These can include thickening of your hair, rapid growth, and changes in texture. Some women also have hair loss during or after pregnancy, or hair that feels dry or thin. Your hair will most likely return to normal after your baby is born.  What to expect at prenatal visits During a routine prenatal visit:  You will be weighed to make sure you and the fetus are growing normally.  Your blood pressure will be taken.  Your abdomen will be measured to track your baby's growth.  The fetal heartbeat will be listened to.  Any test results from the previous visit will be discussed.  Your health care provider may ask you:  How you are feeling.  If you are feeling  the baby move.  If you have had any abnormal symptoms, such as leaking fluid, bleeding, severe headaches, or abdominal cramping.  If you are using any tobacco products, including cigarettes, chewing tobacco, and electronic cigarettes.  If you have any questions.  Other tests that may be performed during your second trimester include:  Blood tests that check for: ? Low iron levels (anemia). ? High blood sugar that affects pregnant women (gestational diabetes) between 24 and 28 weeks. ? Rh antibodies. This is to check for a protein on red blood cells  (Rh factor).  Urine tests to check for infections, diabetes, or protein in the urine.  An ultrasound to confirm the proper growth and development of the baby.  An amniocentesis to check for possible genetic problems.  Fetal screens for spina bifida and Down syndrome.  HIV (human immunodeficiency virus) testing. Routine prenatal testing includes screening for HIV, unless you choose not to have this test.  Follow these instructions at home: Medicines  Follow your health care provider's instructions regarding medicine use. Specific medicines may be either safe or unsafe to take during pregnancy.  Take a prenatal vitamin that contains at least 600 micrograms (mcg) of folic acid.  If you develop constipation, try taking a stool softener if your health care provider approves. Eating and drinking  Eat a balanced diet that includes fresh fruits and vegetables, whole grains, good sources of protein such as meat, eggs, or tofu, and low-fat dairy. Your health care provider will help you determine the amount of weight gain that is right for you.  Avoid raw meat and uncooked cheese. These carry germs that can cause birth defects in the baby.  If you have low calcium intake from food, talk to your health care provider about whether you should take a daily calcium supplement.  Limit foods that are high in fat and processed sugars, such as fried and sweet foods.  To prevent constipation: ? Drink enough fluid to keep your urine clear or pale yellow. ? Eat foods that are high in fiber, such as fresh fruits and vegetables, whole grains, and beans. Activity  Exercise only as directed by your health care provider. Most women can continue their usual exercise routine during pregnancy. Try to exercise for 30 minutes at least 5 days a week. Stop exercising if you experience uterine contractions.  Avoid heavy lifting, wear low heel shoes, and practice good posture.  A sexual relationship may be  continued unless your health care provider directs you otherwise. Relieving pain and discomfort  Wear a good support bra to prevent discomfort from breast tenderness.  Take warm sitz baths to soothe any pain or discomfort caused by hemorrhoids. Use hemorrhoid cream if your health care provider approves.  Rest with your legs elevated if you have leg cramps or low back pain.  If you develop varicose veins, wear support hose. Elevate your feet for 15 minutes, 3-4 times a day. Limit salt in your diet. Prenatal Care  Write down your questions. Take them to your prenatal visits.  Keep all your prenatal visits as told by your health care provider. This is important. Safety  Wear your seat belt at all times when driving.  Make a list of emergency phone numbers, including numbers for family, friends, the hospital, and police and fire departments. General instructions  Ask your health care provider for a referral to a local prenatal education class. Begin classes no later than the beginning of month 6 of your pregnancy.  Ask  for help if you have counseling or nutritional needs during pregnancy. Your health care provider can offer advice or refer you to specialists for help with various needs.  Do not use hot tubs, steam rooms, or saunas.  Do not douche or use tampons or scented sanitary pads.  Do not cross your legs for long periods of time.  Avoid cat litter boxes and soil used by cats. These carry germs that can cause birth defects in the baby and possibly loss of the fetus by miscarriage or stillbirth.  Avoid all smoking, herbs, alcohol, and unprescribed drugs. Chemicals in these products can affect the formation and growth of the baby.  Do not use any products that contain nicotine or tobacco, such as cigarettes and e-cigarettes. If you need help quitting, ask your health care provider.  Visit your dentist if you have not gone yet during your pregnancy. Use a soft toothbrush to brush  your teeth and be gentle when you floss. Contact a health care provider if:  You have dizziness.  You have mild pelvic cramps, pelvic pressure, or nagging pain in the abdominal area.  You have persistent nausea, vomiting, or diarrhea.  You have a bad smelling vaginal discharge.  You have pain when you urinate. Get help right away if:  You have a fever.  You are leaking fluid from your vagina.  You have spotting or bleeding from your vagina.  You have severe abdominal cramping or pain.  You have rapid weight gain or weight loss.  You have shortness of breath with chest pain.  You notice sudden or extreme swelling of your face, hands, ankles, feet, or legs.  You have not felt your baby move in over an hour.  You have severe headaches that do not go away when you take medicine.  You have vision changes. Summary  The second trimester is from week 14 through week 27 (months 4 through 6). It is also a time when the fetus is growing rapidly.  Your body goes through many changes during pregnancy. The changes vary from woman to woman.  Avoid all smoking, herbs, alcohol, and unprescribed drugs. These chemicals affect the formation and growth your baby.  Do not use any tobacco products, such as cigarettes, chewing tobacco, and e-cigarettes. If you need help quitting, ask your health care provider.  Contact your health care provider if you have any questions. Keep all prenatal visits as told by your health care provider. This is important. This information is not intended to replace advice given to you by your health care provider. Make sure you discuss any questions you have with your health care provider. Document Released: 04/20/2001 Document Revised: 10/02/2015 Document Reviewed: 06/27/2012 Elsevier Interactive Patient Education  2017 [employer].

## 2017-06-13 NOTE — Discharge Instructions (Signed)
Call [facility] before 9:00 am to make the appt to have an ultrasound done tomorrow.  Call [phone] to schedule the time.  Also, call [facility] to arrange a follow-up for this week

## 2017-06-13 NOTE — Progress Notes (Signed)
Work-in LOW-RISK PREGNANCY VISIT Patient name: Stacey Cook MRN [id number]  Date of birth: 01-30-1996 Chief Complaint:   Follow-up ([facility] visit)  History of Present Illness:   Stacey Cook is a 22 y.o. G2P0010 female at [redacted]w[redacted]d with an Estimated Date of Delivery: 11/17/17 being seen today for ongoing management of a low-risk pregnancy.  Today she is being seen as a work-in for f/u from [place] visit last night. Was seen for n/v, states they added phenergan suppositories that she can use if phenergan po doesn't stay down. States phenergan works most of time, just occ comes back up. No fm since yesterday, they did get FHT last night. Pretty sure she has definitely been feeling fm, feels like 'bubbles'. Has anterior placenta and is 17wks, possible, but not likely what she is feeling is fm. Occ sharp pain Lt and Rt lower quadrants. States she was told she could come back to [facility] today for u/s or f/u w/ [name] today for u/s. She has an appt 2/14 for anatomy u/s. Labs at ED were normal. Also reports h/o dep/anx, was on Lexapro in past, but didn't really work. Feels she needs to be back on something. Denies SI/HI.   . Vag. Bleeding: None.  Movement: (!) Decreased. denies leaking of fluid. Review of Systems:   Pertinent items are noted in HPI Denies abnormal vaginal discharge w/ itching/odor/irritation, headaches, visual changes, shortness of breath, chest pain, abdominal pain, severe nausea/vomiting, or problems with urination or bowel movements unless otherwise stated above. Pertinent History Reviewed:  Reviewed past medical,surgical, social, obstetrical and family history.  Reviewed problem list, medications and allergies. Physical Assessment:   Vitals:   06/13/17 1118  BP: 126/74  Pulse: (!) 124  Weight: 171 lb (77.6 kg)  Body mass index is 34.54 kg/m.        Physical Examination:   General appearance: Well appearing, and in no distress  Mental status: Alert, oriented to person, place, and  time  Skin: Warm & dry  Cardiovascular: Normal heart rate noted  Respiratory: Normal respiratory effort, no distress  Abdomen: Soft, gravid, nontender  Pelvic: Cervical exam deferred         Extremities: Edema: Trace  Fetal Status: Fetal Heart Rate (bpm): 154   Movement: (!) Decreased    Results for orders placed or performed in visit on 06/13/17 (from the past 24 hour(s))  POCT urinalysis dipstick   Collection Time: 06/13/17 11:21 AM  Result Value Ref Range   Color, UA     Clarity, UA     Glucose, UA neg    Bilirubin, UA     Ketones, UA neg    Spec Grav, UA  1.010 - 1.025   Blood, UA neg    pH, UA  5.0 - 8.0   Protein, UA trace    Urobilinogen, UA  0.2 or 1.0 E.U./dL   Nitrite, UA neg    Leukocytes, UA Small (1+) (A) Negative   Appearance     Odor    Results for orders placed or performed during the hospital encounter of 06/12/17 (from the past 24 hour(s))  Lipase, blood   Collection Time: 06/12/17 10:50 PM  Result Value Ref Range   Lipase 24 11 - 51 U/L  Comprehensive metabolic panel   Collection Time: 06/12/17 10:50 PM  Result Value Ref Range   Sodium 134 (L) 135 - 145 mmol/L   Potassium 3.9 3.5 - 5.1 mmol/L   Chloride 102 101 - 111 mmol/L  CO2 19 (L) 22 - 32 mmol/L   Glucose, Bld 96 65 - 99 mg/dL   BUN 6 6 - 20 mg/dL   Creatinine, Ser 0.43 (L) 0.44 - 1.00 mg/dL   Calcium 9.5 8.9 - 10.3 mg/dL   Total Protein 7.5 6.5 - 8.1 g/dL   Albumin 3.5 3.5 - 5.0 g/dL   AST 22 15 - 41 U/L   ALT 21 14 - 54 U/L   Alkaline Phosphatase 98 38 - 126 U/L   Total Bilirubin 0.4 0.3 - 1.2 mg/dL   GFR calc non Af Amer >60 >60 mL/min   GFR calc Af Amer >60 >60 mL/min   Anion gap 13 5 - 15  CBC   Collection Time: 06/12/17 10:50 PM  Result Value Ref Range   WBC 11.0 (H) 4.0 - 10.5 K/uL   RBC 4.13 3.87 - 5.11 MIL/uL   Hemoglobin 12.0 12.0 - 15.0 g/dL   HCT 36.2 36.0 - 46.0 %   MCV 87.7 78.0 - 100.0 fL   MCH 29.1 26.0 - 34.0 pg   MCHC 33.1 30.0 - 36.0 g/dL   RDW 13.6 11.5 - 15.5  %   Platelets 189 150 - 400 K/uL  Urinalysis, Routine w reflex microscopic   Collection Time: 06/13/17  1:50 AM  Result Value Ref Range   Color, Urine YELLOW YELLOW   APPearance HAZY (A) CLEAR   Specific Gravity, Urine 1.010 1.005 - 1.030   pH 7.0 5.0 - 8.0   Glucose, UA NEGATIVE NEGATIVE mg/dL   Hgb urine dipstick NEGATIVE NEGATIVE   Bilirubin Urine NEGATIVE NEGATIVE   Ketones, ur 20 (A) NEGATIVE mg/dL   Protein, ur NEGATIVE NEGATIVE mg/dL   Nitrite NEGATIVE NEGATIVE   Leukocytes, UA TRACE (A) NEGATIVE   RBC / HPF 0-5 0 - 5 RBC/hpf   WBC, UA 6-30 0 - 5 WBC/hpf   Bacteria, UA RARE (A) NONE SEEN   Squamous Epithelial / LPF 6-30 (A) NONE SEEN   Mucus PRESENT     Assessment & Plan:  1) Low-risk pregnancy G2P0010 at [redacted]w[redacted]d with an Estimated Date of Delivery: 11/17/17   2) N/V of pregnancy, continue phenergan po, use pr when po not staying down. If phenergan stops helping, let us know. Stay hydrated.   3) Decreased fm> discussed it is possible she has been feeling fm prior to today, but not likely as she has anterior placenta and she is only [redacted]wks pregnant. FHT normal today. Does not need u/s right now. Keep appt 2/14 for anatomy u/s and visit  4) Depression/anxiety> rx zoloft 25mg  daily, understands it can take a few weeks to notice improvement.    Meds:  Meds ordered this encounter  Medications  . sertraline (ZOLOFT) 25 MG tablet    Sig: Take 1 tablet (25 mg total) by mouth daily.    Dispense:  30 tablet    Refill:  6    Order Specific Question:   Supervising Provider    Answer:   [name] [[id number]]   Labs/procedures today: none  Plan:  Continue routine obstetrical care   Reviewed: Preterm labor symptoms and general obstetric precautions including but not limited to vaginal bleeding, contractions, leaking of fluid and fetal movement were reviewed in detail with the patient.  All questions were answered  Follow-up: Return for As scheduled 2/14 for anatomy u/s and  visit.  Orders Placed This Encounter  Procedures  . POCT urinalysis dipstick   Tawnya Crook CNM, [facility] 06/13/2017 12:11  PM  

## 2017-06-13 NOTE — ED Notes (Signed)
TT in to assess 

## 2017-06-22 ENCOUNTER — Other Ambulatory Visit: Payer: Self-pay | Admitting: Obstetrics & Gynecology

## 2017-06-22 DIAGNOSIS — Z1379 Encounter for other screening for genetic and chromosomal anomalies: Secondary | ICD-10-CM

## 2017-06-22 DIAGNOSIS — Z363 Encounter for antenatal screening for malformations: Secondary | ICD-10-CM

## 2017-06-23 ENCOUNTER — Encounter: Payer: Self-pay | Admitting: Women's Health

## 2017-06-23 ENCOUNTER — Ambulatory Visit (INDEPENDENT_AMBULATORY_CARE_PROVIDER_SITE_OTHER): Payer: Medicaid Other | Admitting: Women's Health

## 2017-06-23 ENCOUNTER — Encounter: Payer: Medicaid Other | Admitting: Women's Health

## 2017-06-23 ENCOUNTER — Other Ambulatory Visit: Payer: Medicaid Other

## 2017-06-23 ENCOUNTER — Ambulatory Visit (INDEPENDENT_AMBULATORY_CARE_PROVIDER_SITE_OTHER): Payer: Medicaid Other

## 2017-06-23 VITALS — BP 110/78 | HR 94 | Wt 171.0 lb

## 2017-06-23 DIAGNOSIS — O2242 Hemorrhoids in pregnancy, second trimester: Secondary | ICD-10-CM

## 2017-06-23 DIAGNOSIS — O9989 Other specified diseases and conditions complicating pregnancy, childbirth and the puerperium: Secondary | ICD-10-CM

## 2017-06-23 DIAGNOSIS — L292 Pruritus vulvae: Secondary | ICD-10-CM | POA: Diagnosis not present

## 2017-06-23 DIAGNOSIS — O23592 Infection of other part of genital tract in pregnancy, second trimester: Secondary | ICD-10-CM

## 2017-06-23 DIAGNOSIS — Z3A19 19 weeks gestation of pregnancy: Secondary | ICD-10-CM

## 2017-06-23 DIAGNOSIS — N760 Acute vaginitis: Secondary | ICD-10-CM | POA: Diagnosis not present

## 2017-06-23 DIAGNOSIS — Z331 Pregnant state, incidental: Secondary | ICD-10-CM

## 2017-06-23 DIAGNOSIS — R12 Heartburn: Secondary | ICD-10-CM

## 2017-06-23 DIAGNOSIS — Z1389 Encounter for screening for other disorder: Secondary | ICD-10-CM

## 2017-06-23 DIAGNOSIS — O99612 Diseases of the digestive system complicating pregnancy, second trimester: Secondary | ICD-10-CM

## 2017-06-23 DIAGNOSIS — Z363 Encounter for antenatal screening for malformations: Secondary | ICD-10-CM | POA: Diagnosis not present

## 2017-06-23 DIAGNOSIS — Z3482 Encounter for supervision of other normal pregnancy, second trimester: Secondary | ICD-10-CM

## 2017-06-23 DIAGNOSIS — Z1379 Encounter for other screening for genetic and chromosomal anomalies: Secondary | ICD-10-CM

## 2017-06-23 DIAGNOSIS — O26892 Other specified pregnancy related conditions, second trimester: Secondary | ICD-10-CM

## 2017-06-23 DIAGNOSIS — Z3402 Encounter for supervision of normal first pregnancy, second trimester: Secondary | ICD-10-CM

## 2017-06-23 DIAGNOSIS — R110 Nausea: Secondary | ICD-10-CM

## 2017-06-23 DIAGNOSIS — B9689 Other specified bacterial agents as the cause of diseases classified elsewhere: Secondary | ICD-10-CM | POA: Diagnosis not present

## 2017-06-23 DIAGNOSIS — R040 Epistaxis: Secondary | ICD-10-CM

## 2017-06-23 DIAGNOSIS — R51 Headache: Secondary | ICD-10-CM

## 2017-06-23 DIAGNOSIS — Z362 Encounter for other antenatal screening follow-up: Secondary | ICD-10-CM

## 2017-06-23 LAB — POCT WET PREP (WET MOUNT)
Clue Cells Wet Prep Whiff POC: POSITIVE
Trichomonas Wet Prep HPF POC: ABSENT

## 2017-06-23 LAB — POCT URINALYSIS DIPSTICK
Blood, UA: NEGATIVE
Glucose, UA: NEGATIVE
KETONES UA: NEGATIVE
Leukocytes, UA: NEGATIVE
Nitrite, UA: NEGATIVE
Protein, UA: NEGATIVE

## 2017-06-23 MED ORDER — HYDROCORTISONE 2.5 % RE CREA: 1.0000 "application " | TOPICAL_CREAM | Freq: Two times a day (BID) | RECTAL | 0 refills | Status: DC

## 2017-06-23 MED ORDER — METRONIDAZOLE 500 MG PO TABS: 500.0000 mg | ORAL_TABLET | Freq: Two times a day (BID) | ORAL | 0 refills | Status: DC

## 2017-06-23 MED ORDER — DOXYLAMINE-PYRIDOXINE 10-10 MG PO TBEC
DELAYED_RELEASE_TABLET | ORAL | 6 refills | Status: DC
Start: 2017-06-23 — End: 2017-09-15

## 2017-06-23 MED ORDER — RANITIDINE HCL 75 MG PO TABS: 75.0000 mg | ORAL_TABLET | Freq: Two times a day (BID) | ORAL | 3 refills | Status: DC

## 2017-06-23 NOTE — Patient Instructions (Addendum)
Stacey Cook, I greatly value your feedback.  If you receive a survey following your visit with [name] today, we appreciate you taking the time to fill it out.  Thanks, [name], CNM, WHNP-BC  For Headaches:   Stay well hydrated, drink enough water so that your urine is clear, sometimes if you are dehydrated you can get headaches  Eat small frequent meals and snacks, sometimes if you are hungry you can get headaches  Sometimes you get headaches during pregnancy from the pregnancy hormones  You can try tylenol (1-2 regular strength 325mg  or 1-2 extra strength 500mg ) as directed on the box. The least amount of medication that works is best.   Cool compresses (cool wet washcloth or ice pack) to area of head that is hurting  You can also try drinking a caffeinated drink to see if this will help  If not helping, try below:  For Prevention of Headaches/Migraines:  CoQ10 100mg  three times daily  Vitamin B2 400mg  daily  Magnesium Oxide 400-600mg  daily  If You Get a Bad Headache/Migraine:  Benadryl 25mg    Magnesium Oxide  1 large Gatorade  2 extra strength Tylenol (1,000mg  total)  1 cup coffee or Coke  If this doesn't help please call us @ [phone]     Second Trimester of Pregnancy The second trimester is from week 14 through week 27 (months 4 through 6). The second trimester is often a time when you feel your best. Your body has adjusted to being pregnant, and you begin to feel better physically. Usually, morning sickness has lessened or quit completely, you may have more energy, and you may have an increase in appetite. The second trimester is also a time when the fetus is growing rapidly. At the end of the sixth month, the fetus is about 9 inches long and weighs about 1 pounds. You will likely begin to feel the baby move (quickening) between 16 and 20 weeks of pregnancy. Body changes during your second trimester Your body continues to go through many changes during your  second trimester. The changes vary from woman to woman.  Your weight will continue to increase. You will notice your lower abdomen bulging out.  You may begin to get stretch marks on your hips, abdomen, and breasts.  You may develop headaches that can be relieved by medicines. The medicines should be approved by your health care provider.  You may urinate more often because the fetus is pressing on your bladder.  You may develop or continue to have heartburn as a result of your pregnancy.  You may develop constipation because certain hormones are causing the muscles that push waste through your intestines to slow down.  You may develop hemorrhoids or swollen, bulging veins (varicose veins).  You may have back pain. This is caused by: ? Weight gain. ? Pregnancy hormones that are relaxing the joints in your pelvis. ? A shift in weight and the muscles that support your balance.  Your breasts will continue to grow and they will continue to become tender.  Your gums may bleed and may be sensitive to brushing and flossing.  Dark spots or blotches (chloasma, mask of pregnancy) may develop on your face. This will likely fade after the baby is born.  A dark line from your belly button to the pubic area (linea nigra) may appear. This will likely fade after the baby is born.  You may have changes in your hair. These can include thickening of your hair, rapid growth,  and changes in texture. Some women also have hair loss during or after pregnancy, or hair that feels dry or thin. Your hair will most likely return to normal after your baby is born.  What to expect at prenatal visits During a routine prenatal visit:  You will be weighed to make sure you and the fetus are growing normally.  Your blood pressure will be taken.  Your abdomen will be measured to track your baby's growth.  The fetal heartbeat will be listened to.  Any test results from the previous visit will be  discussed.  Your health care provider may ask you:  How you are feeling.  If you are feeling the baby move.  If you have had any abnormal symptoms, such as leaking fluid, bleeding, severe headaches, or abdominal cramping.  If you are using any tobacco products, including cigarettes, chewing tobacco, and electronic cigarettes.  If you have any questions.  Other tests that may be performed during your second trimester include:  Blood tests that check for: ? Low iron levels (anemia). ? High blood sugar that affects pregnant women (gestational diabetes) between 19 and 28 weeks. ? Rh antibodies. This is to check for a protein on red blood cells (Rh factor).  Urine tests to check for infections, diabetes, or protein in the urine.  An ultrasound to confirm the proper growth and development of the baby.  An amniocentesis to check for possible genetic problems.  Fetal screens for spina bifida and Down syndrome.  HIV (human immunodeficiency virus) testing. Routine prenatal testing includes screening for HIV, unless you choose not to have this test.  Follow these instructions at home: Medicines  Follow your health care provider's instructions regarding medicine use. Specific medicines may be either safe or unsafe to take during pregnancy.  Take a prenatal vitamin that contains at least 600 micrograms (mcg) of folic acid.  If you develop constipation, try taking a stool softener if your health care provider approves. Eating and drinking  Eat a balanced diet that includes fresh fruits and vegetables, whole grains, good sources of protein such as meat, eggs, or tofu, and low-fat dairy. Your health care provider will help you determine the amount of weight gain that is right for you.  Avoid raw meat and uncooked cheese. These carry germs that can cause birth defects in the baby.  If you have low calcium intake from food, talk to your health care provider about whether you should take a  daily calcium supplement.  Limit foods that are high in fat and processed sugars, such as fried and sweet foods.  To prevent constipation: ? Drink enough fluid to keep your urine clear or pale yellow. ? Eat foods that are high in fiber, such as fresh fruits and vegetables, whole grains, and beans. Activity  Exercise only as directed by your health care provider. Most women can continue their usual exercise routine during pregnancy. Try to exercise for 30 minutes at least 5 days a week. Stop exercising if you experience uterine contractions.  Avoid heavy lifting, wear low heel shoes, and practice good posture.  A sexual relationship may be continued unless your health care provider directs you otherwise. Relieving pain and discomfort  Wear a good support bra to prevent discomfort from breast tenderness.  Take warm sitz baths to soothe any pain or discomfort caused by hemorrhoids. Use hemorrhoid cream if your health care provider approves.  Rest with your legs elevated if you have leg cramps or low back pain.  If you develop varicose veins, wear support hose. Elevate your feet for 15 minutes, 3-4 times a day. Limit salt in your diet. Prenatal Care  Write down your questions. Take them to your prenatal visits.  Keep all your prenatal visits as told by your health care provider. This is important. Safety  Wear your seat belt at all times when driving.  Make a list of emergency phone numbers, including numbers for family, friends, the hospital, and police and fire departments. General instructions  Ask your health care provider for a referral to a local prenatal education class. Begin classes no later than the beginning of month 6 of your pregnancy.  Ask for help if you have counseling or nutritional needs during pregnancy. Your health care provider can offer advice or refer you to specialists for help with various needs.  Do not use hot tubs, steam rooms, or saunas.  Do not  douche or use tampons or scented sanitary pads.  Do not cross your legs for long periods of time.  Avoid cat litter boxes and soil used by cats. These carry germs that can cause birth defects in the baby and possibly loss of the fetus by miscarriage or stillbirth.  Avoid all smoking, herbs, alcohol, and unprescribed drugs. Chemicals in these products can affect the formation and growth of the baby.  Do not use any products that contain nicotine or tobacco, such as cigarettes and e-cigarettes. If you need help quitting, ask your health care provider.  Visit your dentist if you have not gone yet during your pregnancy. Use a soft toothbrush to brush your teeth and be gentle when you floss. Contact a health care provider if:  You have dizziness.  You have mild pelvic cramps, pelvic pressure, or nagging pain in the abdominal area.  You have persistent nausea, vomiting, or diarrhea.  You have a bad smelling vaginal discharge.  You have pain when you urinate. Get help right away if:  You have a fever.  You are leaking fluid from your vagina.  You have spotting or bleeding from your vagina.  You have severe abdominal cramping or pain.  You have rapid weight gain or weight loss.  You have shortness of breath with chest pain.  You notice sudden or extreme swelling of your face, hands, ankles, feet, or legs.  You have not felt your baby move in over an hour.  You have severe headaches that do not go away when you take medicine.  You have vision changes. Summary  The second trimester is from week 14 through week 27 (months 4 through 6). It is also a time when the fetus is growing rapidly.  Your body goes through many changes during pregnancy. The changes vary from woman to woman.  Avoid all smoking, herbs, alcohol, and unprescribed drugs. These chemicals affect the formation and growth your baby.  Do not use any tobacco products, such as cigarettes, chewing tobacco, and  e-cigarettes. If you need help quitting, ask your health care provider.  Contact your health care provider if you have any questions. Keep all prenatal visits as told by your health care provider. This is important. This information is not intended to replace advice given to you by your health care provider. Make sure you discuss any questions you have with your health care provider. Document Released: 04/20/2001 Document Revised: 10/02/2015 Document Reviewed: 06/27/2012 Elsevier Interactive Patient Education  2017 [employer].

## 2017-06-23 NOTE — Progress Notes (Signed)
LOW-RISK PREGNANCY VISIT Patient name: Stacey Cook MRN [id number]  Date of birth: 1995/07/28 Chief Complaint:   Routine Prenatal Visit ([name] today; nausea, heartburn, yeast infection, nose bleeds, bleeding from rectum)  History of Present Illness:   Stacey Cook is a 22 y.o. G8P0010 female at [redacted]w[redacted]d with an Estimated Date of Delivery: 11/17/17 being seen today for ongoing management of a low-risk pregnancy.  Today she reports nausea- requests diclegis. Heartburn- protonix isn't helping. Nose bleeds, mainly at night. Occ bleeding from rectum w/ bm's. Thinks she has yeast infection- itching/irritation. Headaches since beginning zoloft, depression/anxiety much better  . Vag. Bleeding: None.  Movement: Present. denies leaking of fluid. Review of Systems:   Pertinent items are noted in HPI Denies abnormal vaginal discharge w/ itching/odor/irritation, headaches, visual changes, shortness of breath, chest pain, abdominal pain, severe nausea/vomiting, or problems with urination or bowel movements unless otherwise stated above. Pertinent History Reviewed:  Reviewed past medical,surgical, social, obstetrical and family history.  Reviewed problem list, medications and allergies. Physical Assessment:   Vitals:   06/23/17 1147  BP: 110/78  Pulse: 94  Weight: 171 lb (77.6 kg)  Body mass index is 34.54 kg/m.        Physical Examination:   General appearance: Well appearing, and in no distress  Mental status: Alert, oriented to person, place, and time  Skin: Warm & dry  Cardiovascular: Normal heart rate noted  Respiratory: Normal respiratory effort, no distress  Abdomen: Soft, gravid, nontender  Pelvic: cx visually closed, small amt thin white malodorous d/c          Rectal: no external hemorrhoid, probable internal  Extremities: Edema: Trace  Fetal Status: Fetal Heart Rate (bpm): 146 u/s   Movement: Present   [name] 19 wks,breech,cx 3.8 cm,anterior pl gr 0,normal ovaries bilat,svp of fluid 4.2  cm,fhr 146 bpm,efw 277 g 54%,limited view of spine because of fetal position,please have pt come back for additional images,no obvious abnormalities   Results for orders placed or performed in visit on 06/23/17 (from the past 24 hour(s))  POCT urinalysis dipstick   Collection Time: 06/23/17 12:34 PM  Result Value Ref Range   Color, UA     Clarity, UA     Glucose, UA neg    Bilirubin, UA     Ketones, UA neg    Spec Grav, UA  1.010 - 1.025   Blood, UA neg    pH, UA  5.0 - 8.0   Protein, UA neg    Urobilinogen, UA  0.2 or 1.0 E.U./dL   Nitrite, UA neg    Leukocytes, UA Negative Negative   Appearance     Odor    POCT Wet Prep [facility])   Collection Time: 06/23/17  1:22 PM  Result Value Ref Range   Source Wet Prep POC vaginal    WBC, Wet Prep HPF POC few    Bacteria Wet Prep HPF POC None (A) Few   BACTERIA WET PREP MORPHOLOGY POC     Clue Cells Wet Prep HPF POC Moderate (A) None   Clue Cells Wet Prep Whiff POC Positive Whiff    Yeast Wet Prep HPF POC None    KOH Wet Prep POC     Trichomonas Wet Prep HPF POC Absent Absent    Assessment & Plan:  1) Low-risk pregnancy G2P0010 at [redacted]w[redacted]d with an Estimated Date of Delivery: 11/17/17   2) BV> Rx metronidazole 500mg  BID x 7d for BV, no sex while taking  3)  Nausea> rx diclegis  4) Heartburn> stop protonix, rx zantac  5) Hemorrhoid> rx anusol hc  6) Headaches> gave printed prevention/relief measures   7) Nose bleeds> use humidfier   Meds:  Meds ordered this encounter  Medications  . Doxylamine-Pyridoxine (DICLEGIS) 10-10 MG TBEC    Sig: 2 tabs q hs, if sx persist add 1 tab q am on day 3, if sx persist add 1 tab q afternoon on day 4    Dispense:  100 tablet    Refill:  6    Order Specific Question:   Supervising Provider    Answer:   Elonda Husky, LUTHER H [[id number]]  . metroNIDAZOLE (FLAGYL) 500 MG tablet    Sig: Take 1 tablet (500 mg total) by mouth 2 (two) times daily.    Dispense:  14 tablet    Refill:  0    Order Specific  Question:   Supervising Provider    Answer:   Elonda Husky, LUTHER H [[id number]]  . ranitidine (ZANTAC) 75 MG tablet    Sig: Take 1 tablet (75 mg total) by mouth 2 (two) times daily.    Dispense:  60 tablet    Refill:  3    Order Specific Question:   Supervising Provider    Answer:   Elonda Husky, LUTHER H [[id number]]  . hydrocortisone (ANUSOL-HC) 2.5 % rectal cream    Sig: Place 1 application rectally 2 (two) times daily.    Dispense:  30 g    Refill:  0    Order Specific Question:   Supervising Provider    Answer:   Tania Ade H [[id number]]   Labs/procedures today: u/s, wet prep  Plan:  Continue routine obstetrical care   Reviewed: Preterm labor symptoms and general obstetric precautions including but not limited to vaginal bleeding, contractions, leaking of fluid and fetal movement were reviewed in detail with the patient.  All questions were answered  Follow-up: Return in about 2 weeks (around 07/07/2017) for LROB, US:OB F/U spine.  Orders Placed This Encounter  Procedures  . US OB Follow Up  . POCT urinalysis dipstick  . POCT Wet Prep [facility] [place])   Roma Schanz CNM, [facility] 06/23/2017 1:27 PM

## 2017-06-23 NOTE — Progress Notes (Signed)
[name] 19 wks,breech,cx 3.8 cm,anterior pl gr 0,normal ovaries bilat,svp of fluid 4.2 cm,fhr 146 bpm,efw 277 g 54%,limited view of spine because of fetal position,please have pt come back for additional images,no obvious abnormalities

## 2017-06-27 ENCOUNTER — Encounter (INDEPENDENT_AMBULATORY_CARE_PROVIDER_SITE_OTHER): Payer: Self-pay | Admitting: Pediatric Gastroenterology

## 2017-07-05 ENCOUNTER — Inpatient Hospital Stay (HOSPITAL_COMMUNITY)
Admission: AD | Admit: 2017-07-05 | Discharge: 2017-07-06 | Disposition: A | Discharge status: Home / Self Care | Payer: Medicaid Other | Source: Ambulatory Visit | Attending: Family Medicine | Admitting: Family Medicine

## 2017-07-05 ENCOUNTER — Encounter (HOSPITAL_COMMUNITY): Payer: Self-pay | Admitting: *Deleted

## 2017-07-05 ENCOUNTER — Telehealth: Payer: Self-pay | Admitting: Obstetrics & Gynecology

## 2017-07-05 ENCOUNTER — Other Ambulatory Visit: Payer: Self-pay

## 2017-07-05 ENCOUNTER — Inpatient Hospital Stay (HOSPITAL_COMMUNITY): Payer: Medicaid Other

## 2017-07-05 DIAGNOSIS — O26892 Other specified pregnancy related conditions, second trimester: Secondary | ICD-10-CM | POA: Diagnosis not present

## 2017-07-05 DIAGNOSIS — F329 Major depressive disorder, single episode, unspecified: Secondary | ICD-10-CM | POA: Insufficient documentation

## 2017-07-05 DIAGNOSIS — N898 Other specified noninflammatory disorders of vagina: Secondary | ICD-10-CM | POA: Diagnosis present

## 2017-07-05 DIAGNOSIS — Z87440 Personal history of urinary (tract) infections: Secondary | ICD-10-CM | POA: Insufficient documentation

## 2017-07-05 DIAGNOSIS — O99342 Other mental disorders complicating pregnancy, second trimester: Secondary | ICD-10-CM | POA: Diagnosis not present

## 2017-07-05 DIAGNOSIS — Z820 Family history of epilepsy and other diseases of the nervous system: Secondary | ICD-10-CM | POA: Insufficient documentation

## 2017-07-05 DIAGNOSIS — Z8349 Family history of other endocrine, nutritional and metabolic diseases: Secondary | ICD-10-CM | POA: Insufficient documentation

## 2017-07-05 DIAGNOSIS — R109 Unspecified abdominal pain: Secondary | ICD-10-CM

## 2017-07-05 DIAGNOSIS — Z833 Family history of diabetes mellitus: Secondary | ICD-10-CM | POA: Insufficient documentation

## 2017-07-05 DIAGNOSIS — O9989 Other specified diseases and conditions complicating pregnancy, childbirth and the puerperium: Secondary | ICD-10-CM | POA: Insufficient documentation

## 2017-07-05 DIAGNOSIS — J45909 Unspecified asthma, uncomplicated: Secondary | ICD-10-CM | POA: Insufficient documentation

## 2017-07-05 DIAGNOSIS — Z881 Allergy status to other antibiotic agents status: Secondary | ICD-10-CM | POA: Insufficient documentation

## 2017-07-05 DIAGNOSIS — Z91048 Other nonmedicinal substance allergy status: Secondary | ICD-10-CM | POA: Diagnosis not present

## 2017-07-05 DIAGNOSIS — Z823 Family history of stroke: Secondary | ICD-10-CM | POA: Diagnosis not present

## 2017-07-05 DIAGNOSIS — F419 Anxiety disorder, unspecified: Secondary | ICD-10-CM | POA: Diagnosis not present

## 2017-07-05 DIAGNOSIS — Z9889 Other specified postprocedural states: Secondary | ICD-10-CM | POA: Insufficient documentation

## 2017-07-05 DIAGNOSIS — O99512 Diseases of the respiratory system complicating pregnancy, second trimester: Secondary | ICD-10-CM | POA: Insufficient documentation

## 2017-07-05 DIAGNOSIS — Z803 Family history of malignant neoplasm of breast: Secondary | ICD-10-CM | POA: Diagnosis not present

## 2017-07-05 DIAGNOSIS — N949 Unspecified condition associated with female genital organs and menstrual cycle: Secondary | ICD-10-CM

## 2017-07-05 DIAGNOSIS — Z8249 Family history of ischemic heart disease and other diseases of the circulatory system: Secondary | ICD-10-CM | POA: Diagnosis not present

## 2017-07-05 DIAGNOSIS — Z841 Family history of disorders of kidney and ureter: Secondary | ICD-10-CM | POA: Insufficient documentation

## 2017-07-05 DIAGNOSIS — N1330 Unspecified hydronephrosis: Secondary | ICD-10-CM | POA: Diagnosis not present

## 2017-07-05 DIAGNOSIS — M549 Dorsalgia, unspecified: Secondary | ICD-10-CM | POA: Diagnosis present

## 2017-07-05 DIAGNOSIS — Z87891 Personal history of nicotine dependence: Secondary | ICD-10-CM | POA: Diagnosis not present

## 2017-07-05 DIAGNOSIS — Z3402 Encounter for supervision of normal first pregnancy, second trimester: Secondary | ICD-10-CM

## 2017-07-05 DIAGNOSIS — Z79899 Other long term (current) drug therapy: Secondary | ICD-10-CM | POA: Insufficient documentation

## 2017-07-05 DIAGNOSIS — Z3A20 20 weeks gestation of pregnancy: Secondary | ICD-10-CM

## 2017-07-05 LAB — WET PREP, GENITAL
Clue Cells Wet Prep HPF POC: NONE SEEN
Trich, Wet Prep: NONE SEEN
YEAST WET PREP: NONE SEEN

## 2017-07-05 LAB — CBC
HCT: 32.2 % — ABNORMAL LOW (ref 36.0–46.0)
Hemoglobin: 10.9 g/dL — ABNORMAL LOW (ref 12.0–15.0)
MCH: 29.6 pg (ref 26.0–34.0)
MCHC: 33.9 g/dL (ref 30.0–36.0)
MCV: 87.5 fL (ref 78.0–100.0)
PLATELETS: 189 10*3/uL (ref 150–400)
RBC: 3.68 MIL/uL — ABNORMAL LOW (ref 3.87–5.11)
RDW: 14.2 % (ref 11.5–15.5)
WBC: 12.4 10*3/uL — AB (ref 4.0–10.5)

## 2017-07-05 LAB — COMPREHENSIVE METABOLIC PANEL
ALBUMIN: 3 g/dL — AB (ref 3.5–5.0)
ALT: 18 U/L (ref 14–54)
AST: 18 U/L (ref 15–41)
Alkaline Phosphatase: 112 U/L (ref 38–126)
Anion gap: 9 (ref 5–15)
BUN: 6 mg/dL (ref 6–20)
CHLORIDE: 104 mmol/L (ref 101–111)
CO2: 20 mmol/L — ABNORMAL LOW (ref 22–32)
Calcium: 9.1 mg/dL (ref 8.9–10.3)
Creatinine, Ser: 0.38 mg/dL — ABNORMAL LOW (ref 0.44–1.00)
GFR calc Af Amer: >60 mL/min (ref >60)
GLUCOSE: 87 mg/dL (ref 65–99)
POTASSIUM: 4 mmol/L (ref 3.5–5.1)
SODIUM: 133 mmol/L — AB (ref 135–145)
Total Bilirubin: 0.5 mg/dL (ref 0.3–1.2)
Total Protein: 7.4 g/dL (ref 6.5–8.1)

## 2017-07-05 LAB — URINALYSIS, COMPLETE (UACMP) WITH MICROSCOPIC
BILIRUBIN URINE: NEGATIVE
Glucose, UA: NEGATIVE mg/dL
Hgb urine dipstick: NEGATIVE
Ketones, ur: NEGATIVE mg/dL
Nitrite: NEGATIVE
PH: 7 (ref 5.0–8.0)
Protein, ur: NEGATIVE mg/dL
SPECIFIC GRAVITY, URINE: 1.006 (ref 1.005–1.030)

## 2017-07-05 LAB — POCT FERN TEST: POCT Fern Test: NEGATIVE

## 2017-07-05 MED ORDER — HYDROCODONE-ACETAMINOPHEN 5-325 MG PO TABS
2.0000 | ORAL_TABLET | Freq: Once | ORAL | Status: AC
  Administered 2017-07-05: 2 via ORAL
  Filled 2017-07-05: qty 2

## 2017-07-05 MED ORDER — IOPAMIDOL (ISOVUE-300) INJECTION 61%
100.0000 mL | Freq: Once | INTRAVENOUS | Status: AC | PRN
  Administered 2017-07-05: 100 mL via INTRAVENOUS

## 2017-07-05 MED ORDER — IOPAMIDOL (ISOVUE-300) INJECTION 61%: 30.0000 mL | INTRAVENOUS | Status: DC

## 2017-07-05 MED ORDER — LACTATED RINGERS IV SOLN
INTRAVENOUS | Status: DC
Start: 2017-07-05 — End: 2017-07-06
  Administered 2017-07-05: 125 mL/h via INTRAVENOUS

## 2017-07-05 MED ORDER — IOPAMIDOL (ISOVUE-300) INJECTION 61%
30.0000 mL | INTRAVENOUS | Status: AC
  Administered 2017-07-05: 30 mL via ORAL

## 2017-07-05 NOTE — MAU Note (Signed)
End of shift report given to Risheq, Therapist, sports.

## 2017-07-05 NOTE — MAU Provider Note (Addendum)
[name] is a 22 y.o. G2P0010 At [redacted]w[redacted]d here with complaints of abdominal pain for the past 4 days which is worsening. She denies bleeding, pain with urination.   Patient has a history of chronic abdominal pain. She had intercourse today and it was painful at the time.  History     CSN: [id number]  Arrival date and time: 07/05/17 1801   None     Chief Complaint  Patient presents with  . Vaginal Discharge    "Clear fluid"  . Abdominal Pain  . Back Pain   Abdominal Pain  This is a new problem. The current episode started today. The onset quality is sudden. The problem occurs constantly (comes and goes). The problem has been unchanged. The pain is located in the right flank. The pain is at a severity of 9/10. The quality of the pain is sharp. Pain radiation: radiates to her right lower back. Associated symptoms include nausea. Pertinent negatives include no diarrhea or vomiting. Associated symptoms comments: Has had nausea but its been on-going. Exacerbated by: hurts when her legs are bent; it feels better when her legs are bent.   Patient states that she has had on and off again right sided-pain for four days, but today at 1230 the pain got much worse.   OB History    Gravida Para Term Preterm AB Living   2 0 0 0 1 0   SAB TAB Ectopic Multiple Live Births   1 0 0 0 0      Past Medical History:  Diagnosis Date  . Abdominal pain   . ADD (attention deficit disorder)   . Anxiety   . Arthritis   . Asthma   . 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 Cervicalgia   . Chronic abdominal pain   . Constipation   . Depression   . Ear mass   . Headache   . HSV infection   . Suicidal intent   . UTI (lower urinary tract infection) 05/2014  . Vomiting     Past Surgical History:  Procedure Laterality Date  . CHOLESTEATOMA EXCISION    . DILATION AND EVACUATION N/A 09/11/2016   Procedure: DILATATION AND CURRATAGE  2ND TRIMESTER;  Surgeon: Jonnie Kind, MD;  Location: [facility];  Service: Gynecology;  Laterality: N/A;  . IMPLANTATION BONE ANCHORED HEARING AID Right 04/2013  . 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 TYMPANOPLASTY Left   . TYMPANOSTOMY      Family History  Problem Relation Age of Onset  . Cholelithiasis Mother   . Kidney disease Mother        stones  . Depression Mother   . Hypertension Maternal Grandmother   . Diabetes Maternal Grandmother   . Stroke Maternal Grandmother   . Seizures Maternal Grandmother   . Asthma Maternal Grandmother   . Hyperlipidemia Maternal Grandmother   . Thyroid disease Maternal Grandmother   . Asthma Brother   . Seizures Maternal Uncle   . Cancer Other        breast- great aunt  . Celiac disease Neg Hx     Social History   Tobacco Use  . Smoking status: Former Smoker    Packs/day: 0.00    Years: 1.00    Pack years: 0.00    Types: Cigarettes  . Smokeless tobacco: Never Used  . Tobacco comment: smokes 10 cig daily  Substance Use Topics  . Alcohol use:  No    Alcohol/week: 0.0 oz  . Drug use: No    Allergies:  Allergies  Allergen Reactions  . Keflex [Cephalexin] Other (See Comments)    Reaction:  Elevated liver enzymes   . Other Other (See Comments)    Pt is allergic to surgical glue.  Reaction:  Infection   . Adhesive [Tape] Rash    Medications Prior to Admission  Medication Sig Dispense Refill Last Dose  . acetaminophen (TYLENOL) 500 MG tablet Take 500 mg by mouth as needed.   Taking  . albuterol (PROVENTIL) (2.5 MG/3ML) 0.083% nebulizer solution Take 3 mLs (2.5 mg total) by nebulization every 6 (six) hours as needed for wheezing or shortness of breath. 75 mL 12 Taking  . Doxylamine-Pyridoxine (DICLEGIS) 10-10 MG TBEC 2 tabs q hs, if sx persist add 1 tab q am on day 3, if sx persist add 1 tab q afternoon on day 4 100 tablet 6   . hydrocortisone (ANUSOL-HC) 2.5 % rectal cream Place 1 application rectally 2 (two) times daily. 30 g 0   .  metroNIDAZOLE (FLAGYL) 500 MG tablet Take 1 tablet (500 mg total) by mouth 2 (two) times daily. 14 tablet 0   . Olopatadine HCl (PATADAY) 0.2 % SOLN Apply 1 drop to eye daily as needed (for allergies).   Taking  . pantoprazole (PROTONIX) 20 MG tablet Take 1 tablet (20 mg total) by mouth daily. 30 tablet 6 Taking  . prenatal vitamin w/FE, FA (PRENATAL 1 + 1) 27-1 MG TABS tablet Take 1 tablet by mouth daily at 12 noon. 30 each 12 Taking  . Promethazine HCl (PHENERGAN PO) Take by mouth as needed.   Taking  . ranitidine (ZANTAC) 75 MG tablet Take 1 tablet (75 mg total) by mouth 2 (two) times daily. 60 tablet 3   . sertraline (ZOLOFT) 25 MG tablet Take 1 tablet (25 mg total) by mouth daily. 30 tablet 6 Taking    Review of Systems  HENT: Negative.   Respiratory: Negative.   Gastrointestinal: Positive for abdominal pain and nausea. Negative for diarrhea and vomiting.  Genitourinary: Positive for vaginal discharge.  Musculoskeletal: Negative.   Neurological: Negative.    Physical Exam   Blood pressure 112/66, pulse 94, temperature 98.1 F (36.7 C), temperature source Oral, resp. rate 18, weight 175 lb 4 oz (79.5 kg), last menstrual period 02/10/2017, SpO2 98 %.  Physical Exam  Constitutional: She is oriented to person, place, and time. She appears well-developed.  HENT:  Head: Normocephalic.  Neck: Normal range of motion.  GI: Soft. There is tenderness. There is rebound.  Genitourinary: Vagina normal.  Genitourinary Comments: Normal external female genitalia. Creamy white discharge in the vagina; no lesions on cervix or vaginal walls. Suprapubic and adnexal pain; no CMT.  FHT 154 with doppler   Musculoskeletal: Normal range of motion.  Neurological: She is alert and oriented to person, place, and time.  Skin: Skin is warm and dry.  Psychiatric: She has a normal mood and affect.   Toco was on for some time initially, and no contractions tracing,    Results for orders placed or performed  during the hospital encounter of 07/05/17 (from the past 24 hour(s))  Urinalysis, Complete w Microscopic     Status: Abnormal   Collection Time: 07/05/17  6:24 PM  Result Value Ref Range   Color, Urine STRAW (A) YELLOW   APPearance HAZY (A) CLEAR   Specific Gravity, Urine 1.006 1.005 - 1.030   pH 7.0 5.0 -  8.0   Glucose, UA NEGATIVE NEGATIVE mg/dL   Hgb urine dipstick NEGATIVE NEGATIVE   Bilirubin Urine NEGATIVE NEGATIVE   Ketones, ur NEGATIVE NEGATIVE mg/dL   Protein, ur NEGATIVE NEGATIVE mg/dL   Nitrite NEGATIVE NEGATIVE   Leukocytes, UA MODERATE (A) NEGATIVE   RBC / HPF 0-5 0 - 5 RBC/hpf   WBC, UA 6-30 0 - 5 WBC/hpf   Bacteria, UA RARE (A) NONE SEEN   Squamous Epithelial / LPF 6-30 (A) NONE SEEN   Sperm, UA PRESENT   Wet prep, genital     Status: Abnormal   Collection Time: 07/05/17  7:35 PM  Result Value Ref Range   Yeast Wet Prep HPF POC NONE SEEN NONE SEEN   Trich, Wet Prep NONE SEEN NONE SEEN   Clue Cells Wet Prep HPF POC NONE SEEN NONE SEEN   WBC, Wet Prep HPF POC MODERATE (A) NONE SEEN   Sperm PRESENT   CBC     Status: Abnormal   Collection Time: 07/05/17  7:36 PM  Result Value Ref Range   WBC 12.4 (H) 4.0 - 10.5 K/uL   RBC 3.68 (L) 3.87 - 5.11 MIL/uL   Hemoglobin 10.9 (L) 12.0 - 15.0 g/dL   HCT 32.2 (L) 36.0 - 46.0 %   MCV 87.5 78.0 - 100.0 fL   MCH 29.6 26.0 - 34.0 pg   MCHC 33.9 30.0 - 36.0 g/dL   RDW 14.2 11.5 - 15.5 %   Platelets 189 150 - 400 K/uL  Comprehensive metabolic panel     Status: Abnormal   Collection Time: 07/05/17  7:36 PM  Result Value Ref Range   Sodium 133 (L) 135 - 145 mmol/L   Potassium 4.0 3.5 - 5.1 mmol/L   Chloride 104 101 - 111 mmol/L   CO2 20 (L) 22 - 32 mmol/L   Glucose, Bld 87 65 - 99 mg/dL   BUN 6 6 - 20 mg/dL   Creatinine, Ser 0.38 (L) 0.44 - 1.00 mg/dL   Calcium 9.1 8.9 - 10.3 mg/dL   Total Protein 7.4 6.5 - 8.1 g/dL   Albumin 3.0 (L) 3.5 - 5.0 g/dL   AST 18 15 - 41 U/L   ALT 18 14 - 54 U/L   Alkaline Phosphatase 112 38  - 126 U/L   Total Bilirubin 0.5 0.3 - 1.2 mg/dL   GFR calc non Af Amer >60 >60 mL/min   GFR calc Af Amer >60 >60 mL/min   Anion gap 9 5 - 15  Fern Test     Status: Normal   Collection Time: 07/05/17  9:07 PM  Result Value Ref Range   POCT Fern Test Negative = intact amniotic membranes    Ct Abdomen Pelvis W Contrast  Result Date: 07/06/2017 CLINICAL DATA:  Abdominal pain.  Twin T weeks 5 days pregnant. EXAM: CT ABDOMEN AND PELVIS WITH CONTRAST TECHNIQUE: Multidetector CT imaging of the abdomen and pelvis was performed using the standard protocol following bolus administration of intravenous contrast. CONTRAST:  157mL ISOVUE-300 IOPAMIDOL (ISOVUE-300) INJECTION 61% COMPARISON:  CT 02/06/2017 FINDINGS: Lower chest: Lung bases are clear. No effusions. Heart is normal size. Hepatobiliary: No focal hepatic abnormality. Gallbladder unremarkable. Pancreas: No focal abnormality or ductal dilatation. Spleen: No focal abnormality.  Normal size. Adrenals/Urinary Tract: Mild bilateral hydronephrosis, right greater than left, likely related to pregnancy. No visible ureteral or renal stones. No renal or adrenal mass. Urinary bladder unremarkable. Stomach/Bowel: Appendix is visualized and is normal. Moderate stool burden in the colon.  No evidence of bowel obstruction. Vascular/Lymphatic: No evidence of aneurysm or adenopathy. Prominent gonadal veins in the adnexal regions bilaterally could be related to gonadal vein reflux. Reproductive: Intrauterine gestation noted.  No adnexal masses. Other: No free fluid or free air. Musculoskeletal: No acute bony abnormality. IMPRESSION: Appendix is normal. Moderate stool burden in the colon. Mild to moderate bilateral hydronephrosis, right greater than left. This appears to be related to pregnancy. No visible obstructing stone. Electronically Signed   By: Rolm Baptise M.D.   On: 07/06/2017 00:22   MAU Course  Procedures  MDM -FHR is 154  -Patient appears distressed with  abdominal pain occurs, is able to talk and answer questions between pains. Abdomen is diffusely tender, more so on the right side. Bimanual exam also shows suprapubic and adnexal tenderness, no CMT.   Care endorsed to Marcille Buffy CNM  Assessment and Plan   1. Abdominal pain during pregnancy in second trimester   2. Encounter for supervision of normal first pregnancy in second trimester   3. Abdominal pain   4. Hydronephrosis, unspecified hydronephrosis type   5. [redacted] weeks gestation of pregnancy   6. Round ligament pain    DC home Comfort measures reviewed  2nd/3rd Trimester precautions  PTL precautions  Fetal kick counts RX: flexeril PRN #20  Return to MAU as needed FU with OB as planned  Follow-up Information    [facility] Follow up.   Specialty:  Obstetrics and Gynecology Contact information: [address] [place] 07/05/2017, 7:11 PM

## 2017-07-05 NOTE — MAU Note (Signed)
Urine sent to lab 

## 2017-07-05 NOTE — MAU Note (Signed)
Radiology called for CT scan, pt. Has questions regarding effects on "baby".  Provider notified.

## 2017-07-05 NOTE — Telephone Encounter (Signed)
Patient states she has been having some sharp pain in the lower right side of her belly. She has tried taking a shower and laying on her side but states that makes it hurt worse. She says about a week ago she noticed some watery discharge that has continued and about 3 days ago started having the pain. Advised patient she needed to go to [facility] to have the discharge evaluated to make sure she is not ruptured. Patient stated she would go.

## 2017-07-05 NOTE — MAU Note (Signed)
Pt. Fell early this morning around 0005 while in the shower, fell on back-toward right side. C/o clear liquid discharge that started last week, has been in pain  x3-4days. Positive for fetal movement, denies vaginal bleeding.

## 2017-07-05 NOTE — MAU Note (Signed)
Happened a couple days last wk, when she peed, that it was clear, no yellow tint.  Sometimes was really thick and sticky.  About 3 or 4 days days ago, started having sharp pains in lower abd, starts at side to the front.  Called [facility], unable to move up appt, was told to come here.

## 2017-07-06 DIAGNOSIS — R109 Unspecified abdominal pain: Secondary | ICD-10-CM

## 2017-07-06 DIAGNOSIS — O26892 Other specified pregnancy related conditions, second trimester: Secondary | ICD-10-CM

## 2017-07-06 LAB — GC/CHLAMYDIA PROBE AMP (~~LOC~~) NOT AT ARMC
Chlamydia: NEGATIVE
Neisseria Gonorrhea: NEGATIVE

## 2017-07-06 MED ORDER — CYCLOBENZAPRINE HCL 10 MG PO TABS: 10.0000 mg | ORAL_TABLET | Freq: Two times a day (BID) | ORAL | 0 refills | Status: DC | PRN

## 2017-07-06 NOTE — Discharge Instructions (Signed)

## 2017-07-07 ENCOUNTER — Ambulatory Visit (INDEPENDENT_AMBULATORY_CARE_PROVIDER_SITE_OTHER): Payer: Medicaid Other | Admitting: Women's Health

## 2017-07-07 ENCOUNTER — Encounter: Payer: Self-pay | Admitting: Women's Health

## 2017-07-07 ENCOUNTER — Other Ambulatory Visit: Payer: Self-pay

## 2017-07-07 ENCOUNTER — Ambulatory Visit (INDEPENDENT_AMBULATORY_CARE_PROVIDER_SITE_OTHER): Payer: Medicaid Other

## 2017-07-07 VITALS — BP 102/64 | HR 97 | Wt 175.0 lb

## 2017-07-07 DIAGNOSIS — Z3A21 21 weeks gestation of pregnancy: Secondary | ICD-10-CM

## 2017-07-07 DIAGNOSIS — Z3482 Encounter for supervision of other normal pregnancy, second trimester: Secondary | ICD-10-CM

## 2017-07-07 DIAGNOSIS — Z362 Encounter for other antenatal screening follow-up: Secondary | ICD-10-CM

## 2017-07-07 DIAGNOSIS — F909 Attention-deficit hyperactivity disorder, unspecified type: Secondary | ICD-10-CM | POA: Diagnosis not present

## 2017-07-07 DIAGNOSIS — O09292 Supervision of pregnancy with other poor reproductive or obstetric history, second trimester: Secondary | ICD-10-CM | POA: Diagnosis not present

## 2017-07-07 DIAGNOSIS — Z1389 Encounter for screening for other disorder: Secondary | ICD-10-CM

## 2017-07-07 DIAGNOSIS — O9989 Other specified diseases and conditions complicating pregnancy, childbirth and the puerperium: Secondary | ICD-10-CM

## 2017-07-07 DIAGNOSIS — O99342 Other mental disorders complicating pregnancy, second trimester: Secondary | ICD-10-CM

## 2017-07-07 DIAGNOSIS — R102 Pelvic and perineal pain: Secondary | ICD-10-CM

## 2017-07-07 DIAGNOSIS — F913 Oppositional defiant disorder: Secondary | ICD-10-CM

## 2017-07-07 DIAGNOSIS — Z331 Pregnant state, incidental: Secondary | ICD-10-CM

## 2017-07-07 LAB — POCT URINALYSIS DIPSTICK
Blood, UA: NEGATIVE
Glucose, UA: NEGATIVE
KETONES UA: NEGATIVE
Nitrite, UA: NEGATIVE
Protein, UA: NEGATIVE

## 2017-07-07 LAB — CULTURE, OB URINE

## 2017-07-07 NOTE — Patient Instructions (Signed)
Stacey Cook, I greatly value your feedback.  If you receive a survey following your visit with [name] today, we appreciate you taking the time to fill it out.  Thanks, [name], CNM, WHNP-BC   Second Trimester of Pregnancy The second trimester is from week 14 through week 27 (months 4 through 6). The second trimester is often a time when you feel your best. Your body has adjusted to being pregnant, and you begin to feel better physically. Usually, morning sickness has lessened or quit completely, you may have more energy, and you may have an increase in appetite. The second trimester is also a time when the fetus is growing rapidly. At the end of the sixth month, the fetus is about 9 inches long and weighs about 1 pounds. You will likely begin to feel the baby move (quickening) between 16 and 20 weeks of pregnancy. Body changes during your second trimester Your body continues to go through many changes during your second trimester. The changes vary from woman to woman.  Your weight will continue to increase. You will notice your lower abdomen bulging out.  You may begin to get stretch marks on your hips, abdomen, and breasts.  You may develop headaches that can be relieved by medicines. The medicines should be approved by your health care provider.  You may urinate more often because the fetus is pressing on your bladder.  You may develop or continue to have heartburn as a result of your pregnancy.  You may develop constipation because certain hormones are causing the muscles that push waste through your intestines to slow down.  You may develop hemorrhoids or swollen, bulging veins (varicose veins).  You may have back pain. This is caused by: ? Weight gain. ? Pregnancy hormones that are relaxing the joints in your pelvis. ? A shift in weight and the muscles that support your balance.  Your breasts will continue to grow and they will continue to become tender.  Your gums may bleed and  may be sensitive to brushing and flossing.  Dark spots or blotches (chloasma, mask of pregnancy) may develop on your face. This will likely fade after the baby is born.  A dark line from your belly button to the pubic area (linea nigra) may appear. This will likely fade after the baby is born.  You may have changes in your hair. These can include thickening of your hair, rapid growth, and changes in texture. Some women also have hair loss during or after pregnancy, or hair that feels dry or thin. Your hair will most likely return to normal after your baby is born.  What to expect at prenatal visits During a routine prenatal visit:  You will be weighed to make sure you and the fetus are growing normally.  Your blood pressure will be taken.  Your abdomen will be measured to track your baby's growth.  The fetal heartbeat will be listened to.  Any test results from the previous visit will be discussed.  Your health care provider may ask you:  How you are feeling.  If you are feeling the baby move.  If you have had any abnormal symptoms, such as leaking fluid, bleeding, severe headaches, or abdominal cramping.  If you are using any tobacco products, including cigarettes, chewing tobacco, and electronic cigarettes.  If you have any questions.  Other tests that may be performed during your second trimester include:  Blood tests that check for: ? Low iron levels (anemia). ? High  blood sugar that affects pregnant women (gestational diabetes) between 3 and 28 weeks. ? Rh antibodies. This is to check for a protein on red blood cells (Rh factor).  Urine tests to check for infections, diabetes, or protein in the urine.  An ultrasound to confirm the proper growth and development of the baby.  An amniocentesis to check for possible genetic problems.  Fetal screens for spina bifida and Down syndrome.  HIV (human immunodeficiency virus) testing. Routine prenatal testing includes  screening for HIV, unless you choose not to have this test.  Follow these instructions at home: Medicines  Follow your health care provider's instructions regarding medicine use. Specific medicines may be either safe or unsafe to take during pregnancy.  Take a prenatal vitamin that contains at least 600 micrograms (mcg) of folic acid.  If you develop constipation, try taking a stool softener if your health care provider approves. Eating and drinking  Eat a balanced diet that includes fresh fruits and vegetables, whole grains, good sources of protein such as meat, eggs, or tofu, and low-fat dairy. Your health care provider will help you determine the amount of weight gain that is right for you.  Avoid raw meat and uncooked cheese. These carry germs that can cause birth defects in the baby.  If you have low calcium intake from food, talk to your health care provider about whether you should take a daily calcium supplement.  Limit foods that are high in fat and processed sugars, such as fried and sweet foods.  To prevent constipation: ? Drink enough fluid to keep your urine clear or pale yellow. ? Eat foods that are high in fiber, such as fresh fruits and vegetables, whole grains, and beans. Activity  Exercise only as directed by your health care provider. Most women can continue their usual exercise routine during pregnancy. Try to exercise for 30 minutes at least 5 days a week. Stop exercising if you experience uterine contractions.  Avoid heavy lifting, wear low heel shoes, and practice good posture.  A sexual relationship may be continued unless your health care provider directs you otherwise. Relieving pain and discomfort  Wear a good support bra to prevent discomfort from breast tenderness.  Take warm sitz baths to soothe any pain or discomfort caused by hemorrhoids. Use hemorrhoid cream if your health care provider approves.  Rest with your legs elevated if you have leg cramps  or low back pain.  If you develop varicose veins, wear support hose. Elevate your feet for 15 minutes, 3-4 times a day. Limit salt in your diet. Prenatal Care  Write down your questions. Take them to your prenatal visits.  Keep all your prenatal visits as told by your health care provider. This is important. Safety  Wear your seat belt at all times when driving.  Make a list of emergency phone numbers, including numbers for family, friends, the hospital, and police and fire departments. General instructions  Ask your health care provider for a referral to a local prenatal education class. Begin classes no later than the beginning of month 6 of your pregnancy.  Ask for help if you have counseling or nutritional needs during pregnancy. Your health care provider can offer advice or refer you to specialists for help with various needs.  Do not use hot tubs, steam rooms, or saunas.  Do not douche or use tampons or scented sanitary pads.  Do not cross your legs for long periods of time.  Avoid cat litter boxes and soil  used by cats. These carry germs that can cause birth defects in the baby and possibly loss of the fetus by miscarriage or stillbirth.  Avoid all smoking, herbs, alcohol, and unprescribed drugs. Chemicals in these products can affect the formation and growth of the baby.  Do not use any products that contain nicotine or tobacco, such as cigarettes and e-cigarettes. If you need help quitting, ask your health care provider.  Visit your dentist if you have not gone yet during your pregnancy. Use a soft toothbrush to brush your teeth and be gentle when you floss. Contact a health care provider if:  You have dizziness.  You have mild pelvic cramps, pelvic pressure, or nagging pain in the abdominal area.  You have persistent nausea, vomiting, or diarrhea.  You have a bad smelling vaginal discharge.  You have pain when you urinate. Get help right away if:  You have a  fever.  You are leaking fluid from your vagina.  You have spotting or bleeding from your vagina.  You have severe abdominal cramping or pain.  You have rapid weight gain or weight loss.  You have shortness of breath with chest pain.  You notice sudden or extreme swelling of your face, hands, ankles, feet, or legs.  You have not felt your baby move in over an hour.  You have severe headaches that do not go away when you take medicine.  You have vision changes. Summary  The second trimester is from week 14 through week 27 (months 4 through 6). It is also a time when the fetus is growing rapidly.  Your body goes through many changes during pregnancy. The changes vary from woman to woman.  Avoid all smoking, herbs, alcohol, and unprescribed drugs. These chemicals affect the formation and growth your baby.  Do not use any tobacco products, such as cigarettes, chewing tobacco, and e-cigarettes. If you need help quitting, ask your health care provider.  Contact your health care provider if you have any questions. Keep all prenatal visits as told by your health care provider. This is important. This information is not intended to replace advice given to you by your health care provider. Make sure you discuss any questions you have with your health care provider. Document Released: 04/20/2001 Document Revised: 10/02/2015 Document Reviewed: 06/27/2012 Elsevier Interactive Patient Education  2017 [employer].

## 2017-07-07 NOTE — Progress Notes (Signed)
[name] 21 wks,breech,cx 3.4 cm,anterior pl gr 0,normal ovaries bilat,svp of fluid 3.7 cm,fhr 166 bpm,efw 406 g 55%,anatomy of spine complete,no obvious abnormalities

## 2017-07-07 NOTE — Progress Notes (Signed)
   LOW-RISK PREGNANCY VISIT Patient name: Stacey Cook MRN [id number]  Date of birth: 06-16-1995 Chief Complaint:   Routine Prenatal Visit (u/s today)  History of Present Illness:   Stacey Cook is a 22 y.o. G2P0010 female at [redacted]w[redacted]d with an Estimated Date of Delivery: 11/17/17 being seen today for ongoing management of a low-risk pregnancy.  Today she reports went to [facility] tues night w/ abd pain, flexeril/apap helps.  . Vag. Bleeding: None.  Movement: Present. denies leaking of fluid. Review of Systems:   Pertinent items are noted in HPI Denies abnormal vaginal discharge w/ itching/odor/irritation, headaches, visual changes, shortness of breath, chest pain, abdominal pain, severe nausea/vomiting, or problems with urination or bowel movements unless otherwise stated above. Pertinent History Reviewed:  Reviewed past medical,surgical, social, obstetrical and family history.  Reviewed problem list, medications and allergies. Physical Assessment:   Vitals:   07/07/17 1211  BP: 102/64  Pulse: 97  Weight: 175 lb (79.4 kg)  Body mass index is 35.35 kg/m.        Physical Examination:   General appearance: Well appearing, and in no distress  Mental status: Alert, oriented to person, place, and time  Skin: Warm & dry  Cardiovascular: Normal heart rate noted  Respiratory: Normal respiratory effort, no distress  Abdomen: Soft, gravid, nontender  Pelvic: Cervical exam deferred         Extremities: Edema: Trace  Fetal Status: Fetal Heart Rate (bpm): 166 u/s   Movement: Present    [name] 21 wks,breech,cx 3.4 cm,anterior pl gr 0,normal ovaries bilat,svp of fluid 3.7 cm,fhr 166 bpm,efw 406 g 55%,anatomy of spine complete,no obvious abnormalities    Results for orders placed or performed in visit on 07/07/17 (from the past 24 hour(s))  POCT urinalysis dipstick   Collection Time: 07/07/17 12:13 PM  Result Value Ref Range   Color, UA     Clarity, UA     Glucose, UA neg    Bilirubin, UA     Ketones, UA neg    Spec Grav, UA  1.010 - 1.025   Blood, UA neg    pH, UA  5.0 - 8.0   Protein, UA neg    Urobilinogen, UA  0.2 or 1.0 E.U./dL   Nitrite, UA neg    Leukocytes, UA Moderate (2+) (A) Negative   Appearance     Odor      Assessment & Plan:  1) Low-risk pregnancy G2P0010 at [redacted]w[redacted]d with an Estimated Date of Delivery: 11/17/17    Meds: No orders of the defined types were placed in this encounter.  Labs/procedures today: f/u anatomy>now complete  Plan:  Continue routine obstetrical care   Reviewed: Preterm labor symptoms and general obstetric precautions including but not limited to vaginal bleeding, contractions, leaking of fluid and fetal movement were reviewed in detail with the patient.  All questions were answered  Follow-up: Return in about 4 weeks (around 08/04/2017) for [place].  Orders Placed This Encounter  Procedures  . POCT urinalysis dipstick   Roma Schanz CNM, [facility] 07/07/2017 1:44 PM

## 2017-08-05 ENCOUNTER — Encounter: Payer: Self-pay | Admitting: Obstetrics and Gynecology

## 2017-08-05 ENCOUNTER — Ambulatory Visit (INDEPENDENT_AMBULATORY_CARE_PROVIDER_SITE_OTHER): Payer: Medicaid Other | Admitting: Obstetrics and Gynecology

## 2017-08-05 VITALS — BP 120/70 | HR 95 | Wt 183.4 lb

## 2017-08-05 DIAGNOSIS — Z1389 Encounter for screening for other disorder: Secondary | ICD-10-CM

## 2017-08-05 DIAGNOSIS — Z3A25 25 weeks gestation of pregnancy: Secondary | ICD-10-CM

## 2017-08-05 DIAGNOSIS — O9989 Other specified diseases and conditions complicating pregnancy, childbirth and the puerperium: Secondary | ICD-10-CM

## 2017-08-05 DIAGNOSIS — Z331 Pregnant state, incidental: Secondary | ICD-10-CM

## 2017-08-05 DIAGNOSIS — M549 Dorsalgia, unspecified: Secondary | ICD-10-CM

## 2017-08-05 DIAGNOSIS — O99612 Diseases of the digestive system complicating pregnancy, second trimester: Secondary | ICD-10-CM

## 2017-08-05 DIAGNOSIS — K219 Gastro-esophageal reflux disease without esophagitis: Secondary | ICD-10-CM

## 2017-08-05 DIAGNOSIS — Z3482 Encounter for supervision of other normal pregnancy, second trimester: Secondary | ICD-10-CM

## 2017-08-05 DIAGNOSIS — R079 Chest pain, unspecified: Secondary | ICD-10-CM | POA: Insufficient documentation

## 2017-08-05 LAB — POCT URINALYSIS DIPSTICK
Blood, UA: NEGATIVE
GLUCOSE UA: NEGATIVE
Ketones, UA: NEGATIVE
Leukocytes, UA: NEGATIVE
Nitrite, UA: NEGATIVE
Protein, UA: NEGATIVE

## 2017-08-05 MED ORDER — OMEPRAZOLE 20 MG PO CPDR: 20.0000 mg | DELAYED_RELEASE_CAPSULE | Freq: Every day | ORAL | 3 refills | Status: DC

## 2017-08-05 NOTE — Progress Notes (Signed)
LOW-RISK PREGNANCY VISIT Patient name: Stacey Cook MRN [id number]  Date of birth: 08/20/1995 Chief Complaint:   Routine Prenatal Visit (back pain/ chest pain)  History of Present Illness:   Stacey Cook is a 22 y.o. G70P0010 female at [redacted]w[redacted]d with an Estimated Date of Delivery: 11/17/17 being seen today for ongoing management of a low-risk pregnancy.   She reports sharp upper left chest pain that is worsened with lying down x 1 week. She note that this is a tightness sensation.  Hx GERD She has been using her inhaler recently with no relief of her symptoms. She has heartburn all day that is worsened at night. She also has mid bilateral back pain. She completes back exercises with no relief. Tiger balm patches work mildly for her back pain at this time. She denies leaking of fluid. Review of Systems:   Pertinent items are noted in HPI Denies abnormal vaginal discharge w/ itching/odor/irritation, headaches, visual changes, shortness of breath, chest pain, abdominal pain, severe nausea/vomiting, or problems with urination or bowel movements unless otherwise stated above. Pertinent History Reviewed:  Reviewed past medical,surgical, social, obstetrical and family history.  Reviewed problem list, medications and allergies. Physical Assessment:   Vitals:   08/05/17 1113  BP: 120/70  Pulse: 95  Weight: 183 lb 6.4 oz (83.2 kg)  Body mass index is 37.04 kg/m.        Physical Examination:   General appearance: Well appearing, and in no distress  Mental status: Alert, oriented to person, place, and time  Skin: Warm & dry  Cardiovascular: Normal heart rate and rhythm noted. No murmurs, rubs, or gallops  Respiratory: Normal respiratory effort, no distress  Abdomen: Soft, gravid, nontender  Pelvic: Cervical exam deferred   Extremities: Edema: Trace  Fetal Status: FH: 23 cm. FHR: 132  Results for orders placed or performed in visit on 08/05/17 (from the past 24 hour(s))  POCT urinalysis  dipstick   Collection Time: 08/05/17 11:20 AM  Result Value Ref Range   Color, UA     Clarity, UA     Glucose, UA neg    Bilirubin, UA     Ketones, UA neg    Spec Grav, UA  1.010 - 1.025   Blood, UA neg    pH, UA  5.0 - 8.0   Protein, UA neg    Urobilinogen, UA  0.2 or 1.0 E.U./dL   Nitrite, UA neg    Leukocytes, UA Negative Negative   Appearance     Odor      Assessment & Plan:  1) Low-risk pregnancy G2P0010 at [redacted]w[redacted]d with an Estimated Date of Delivery: 11/17/17   2) Esophageal spasms/GERD, stable add omeprazole and at bedtime Maalox  3. Backache: local heat, massage, will consider flexeril prn If it persists Meds: Omeprazole             Labs/procedures today:   Plan:  Continue routine obstetrical care. Will prescribe omeprazole and take Maalox at night.    Reviewed: Preterm labor symptoms and general obstetric precautions including but not limited to vaginal bleeding, contractions, leaking of fluid and fetal movement were reviewed in detail with the patient.  All questions were answered  Follow-up: Return in about 2 weeks (around 08/19/2017) for PN2.  Orders Placed This Encounter  Procedures  . POCT urinalysis dipstick   By signing my name below, I, Soijett Blue, attest that this documentation has been prepared under the direction and in the presence of Jonnie Kind, MD.  Electronically Signed: Soijett Blue, Scribe. 08/05/17. 9:20 AM.  I personally performed the services described in this documentation, which was SCRIBED in my presence. The recorded information has been reviewed and considered accurate. It has been edited as necessary during review. Jonnie Kind, MD

## 2017-08-15 ENCOUNTER — Ambulatory Visit (INDEPENDENT_AMBULATORY_CARE_PROVIDER_SITE_OTHER): Payer: Medicaid Other | Admitting: Obstetrics and Gynecology

## 2017-08-15 ENCOUNTER — Encounter: Payer: Self-pay | Admitting: Obstetrics and Gynecology

## 2017-08-15 ENCOUNTER — Other Ambulatory Visit: Payer: Self-pay

## 2017-08-15 VITALS — BP 122/84 | HR 118 | Wt 186.0 lb

## 2017-08-15 DIAGNOSIS — O9989 Other specified diseases and conditions complicating pregnancy, childbirth and the puerperium: Secondary | ICD-10-CM

## 2017-08-15 DIAGNOSIS — Z331 Pregnant state, incidental: Secondary | ICD-10-CM

## 2017-08-15 DIAGNOSIS — Z1389 Encounter for screening for other disorder: Secondary | ICD-10-CM

## 2017-08-15 DIAGNOSIS — G894 Chronic pain syndrome: Secondary | ICD-10-CM

## 2017-08-15 DIAGNOSIS — Z3482 Encounter for supervision of other normal pregnancy, second trimester: Secondary | ICD-10-CM

## 2017-08-15 DIAGNOSIS — M549 Dorsalgia, unspecified: Secondary | ICD-10-CM

## 2017-08-15 DIAGNOSIS — Z3A26 26 weeks gestation of pregnancy: Secondary | ICD-10-CM

## 2017-08-15 LAB — POCT URINALYSIS DIPSTICK
Blood, UA: NEGATIVE
GLUCOSE UA: NEGATIVE
KETONES UA: NEGATIVE
NITRITE UA: NEGATIVE

## 2017-08-15 MED ORDER — ACETAMINOPHEN-CODEINE #3 300-30 MG PO TABS: 1.0000 | ORAL_TABLET | ORAL | 0 refills | Status: AC | PRN

## 2017-08-15 MED ORDER — CYCLOBENZAPRINE HCL 10 MG PO TABS: 10.0000 mg | ORAL_TABLET | Freq: Three times a day (TID) | ORAL | 1 refills | Status: DC | PRN

## 2017-08-15 NOTE — Progress Notes (Signed)
Patient ID: Stacey Cook, female   DOB: [birth year], 22 y.o.   MRN: [id number]   LOW-RISK PREGNANCY VISIT Patient name: Stacey Cook MRN [id number]  Date of birth: [birth year] Chief Complaint:   Back Pain (decreased movement)  History of Present Illness:   Stacey Cook is a 22 y.o. G2P0010 female at [redacted]w[redacted]d with an Estimated Date of Delivery: 11/17/17 being seen today for ongoing management of a low-risk pregnancy.  Today she reports worsening, intermittent, short lasting episodes of back pain for the last week. She was taking flexeril that was prescribed from [facility] for her side pain and notes that it helped. She adds her fiancee has tried to massage her back but it did not help. Xoe states being active as a child and that she first noticed back pain when she was round 22 years old. She has tried to walk for relief but it did not help. She also endorses unrelated abdominal cramping. Family history of deteriorating back discs. Contractions: Not present. Vag. Bleeding: None.  Movement: (!) Decreased. denies leaking of fluid. Review of Systems:   Pertinent items are noted in HPI patient reports she is had chronic back pain since age 70   Denies abnormal vaginal discharge w/ itching/odor/irritation, headaches, visual changes, shortness of breath, chest pain, abdominal pain, severe nausea/vomiting, or problems with urination or bowel movements unless otherwise stated above. Pertinent History Reviewed:  Reviewed past medical,surgical, social, obstetrical and family history.  Reviewed problem list, medications and allergies.  Patient's been on multiple medications in this pregnancy see med list Physical Assessment:   Vitals:   08/15/17 1338  BP: 122/84  Pulse: (!) 118  Weight: 186 lb (84.4 kg)  Body mass index is 37.57 kg/m.        Physical Examination:   General appearance: Well appearing, and in no distress.  Somewhat dramatic description of her discomforts  Mental status: Alert, oriented  to person, place, and time  Skin: Warm & dry  Cardiovascular: Normal heart rate noted  Respiratory: Normal respiratory effort, no distress  Abdomen: Soft, gravid, nontender  Musculoskeletal: Midline tenderness T12 to L5, tight paraspinal mucsle  tenderness to palpation, Normal ROM, no radiation down legs.    Pelvic: Cervical exam deferred         Extremities: Edema: Trace  Fetal Status:     Movement: (!) Decreased  Fetal movement auscultated during heartbeat check  Results for orders placed or performed in visit on 08/15/17 (from the past 24 hour(s))  POCT urinalysis dipstick   Collection Time: 08/15/17  1:40 PM  Result Value Ref Range   Color, UA     Clarity, UA     Glucose, UA neg    Bilirubin, UA     Ketones, UA neg    Spec Grav, UA  1.010 - 1.025   Blood, UA neg    pH, UA  5.0 - 8.0   Protein, UA eg    Urobilinogen, UA  0.2 or 1.0 E.U./dL   Nitrite, UA neg    Leukocytes, UA Large (3+) (A) Negative   Appearance     Odor      Assessment & Plan:  1) Low-risk pregnancy G2P0010 at [redacted]w[redacted]d with an Estimated Date of Delivery: 11/17/17   2) Chronic Back pain, Rx flexeril, Rx tylenol #3 without evidence of sciatica or other  acute change   Meds: No orders of the defined types were placed in this encounter.  Labs/procedures today:   Plan:  Continue routine  obstetrical care   Follow-up: Return in about 3 days (around 08/18/2017) for [place], PN-2 Labs.  Orders Placed This Encounter  Procedures  . POCT urinalysis dipstick   By signing my name below, I, Margit Banda, attest that this documentation has been prepared under the direction and in the presence of Jonnie Kind, MD. Electronically Signed: Margit Banda, Medical Scribe. 08/15/17. 1:57 PM.  I personally performed the services described in this documentation, which was SCRIBED in my presence. The recorded information has been reviewed and considered accurate. It has been edited as necessary during review. Jonnie Kind, MD

## 2017-08-16 ENCOUNTER — Inpatient Hospital Stay (HOSPITAL_COMMUNITY)
Admission: AD | Admit: 2017-08-16 | Discharge: 2017-08-16 | Disposition: A | Discharge status: Home / Self Care | Payer: Medicaid Other | Source: Ambulatory Visit | Attending: Obstetrics & Gynecology | Admitting: Obstetrics & Gynecology

## 2017-08-16 ENCOUNTER — Encounter (HOSPITAL_COMMUNITY): Payer: Self-pay | Admitting: *Deleted

## 2017-08-16 DIAGNOSIS — Z3482 Encounter for supervision of other normal pregnancy, second trimester: Secondary | ICD-10-CM

## 2017-08-16 DIAGNOSIS — Z3A26 26 weeks gestation of pregnancy: Secondary | ICD-10-CM | POA: Diagnosis not present

## 2017-08-16 DIAGNOSIS — N760 Acute vaginitis: Secondary | ICD-10-CM | POA: Diagnosis not present

## 2017-08-16 DIAGNOSIS — B9689 Other specified bacterial agents as the cause of diseases classified elsewhere: Secondary | ICD-10-CM | POA: Insufficient documentation

## 2017-08-16 DIAGNOSIS — O26899 Other specified pregnancy related conditions, unspecified trimester: Secondary | ICD-10-CM

## 2017-08-16 DIAGNOSIS — O23592 Infection of other part of genital tract in pregnancy, second trimester: Secondary | ICD-10-CM | POA: Insufficient documentation

## 2017-08-16 DIAGNOSIS — Z87891 Personal history of nicotine dependence: Secondary | ICD-10-CM | POA: Diagnosis not present

## 2017-08-16 DIAGNOSIS — R109 Unspecified abdominal pain: Secondary | ICD-10-CM | POA: Diagnosis not present

## 2017-08-16 DIAGNOSIS — O26893 Other specified pregnancy related conditions, third trimester: Secondary | ICD-10-CM | POA: Diagnosis not present

## 2017-08-16 LAB — URINALYSIS, ROUTINE W REFLEX MICROSCOPIC
Bilirubin Urine: NEGATIVE
Glucose, UA: NEGATIVE mg/dL
Hgb urine dipstick: NEGATIVE
Ketones, ur: NEGATIVE mg/dL
Nitrite: NEGATIVE
PH: 6 (ref 5.0–8.0)
Protein, ur: NEGATIVE mg/dL
SPECIFIC GRAVITY, URINE: 1.017 (ref 1.005–1.030)

## 2017-08-16 LAB — WET PREP, GENITAL
Sperm: NONE SEEN
TRICH WET PREP: NONE SEEN
YEAST WET PREP: NONE SEEN

## 2017-08-16 LAB — FETAL FIBRONECTIN: Fetal Fibronectin: NEGATIVE

## 2017-08-16 MED ORDER — PANTOPRAZOLE SODIUM 20 MG PO TBEC
20.0000 mg | DELAYED_RELEASE_TABLET | Freq: Once | ORAL | Status: AC
  Administered 2017-08-16: 20 mg via ORAL
  Filled 2017-08-16: qty 1

## 2017-08-16 MED ORDER — METRONIDAZOLE 500 MG PO TABS: 500.0000 mg | ORAL_TABLET | Freq: Two times a day (BID) | ORAL | 0 refills | Status: AC

## 2017-08-16 MED ORDER — NIFEDIPINE 10 MG PO CAPS
10.0000 mg | ORAL_CAPSULE | ORAL | Status: DC | PRN
  Administered 2017-08-16: 10 mg via ORAL
  Filled 2017-08-16: qty 1

## 2017-08-16 NOTE — Discharge Instructions (Signed)
Braxton Hicks Contractions °Contractions of the uterus can occur throughout pregnancy, but they are not always a sign that you are in labor. You may have practice contractions called Braxton Hicks contractions. These false labor contractions are sometimes confused with true labor. °What are Braxton Hicks contractions? °Braxton Hicks contractions are tightening movements that occur in the muscles of the uterus before labor. Unlike true labor contractions, these contractions do not result in opening (dilation) and thinning of the cervix. Toward the end of pregnancy (32-34 weeks), Braxton Hicks contractions can happen more often and may become stronger. These contractions are sometimes difficult to tell apart from true labor because they can be very uncomfortable. You should not feel embarrassed if you go to the hospital with false labor. °Sometimes, the only way to tell if you are in true labor is for your health care provider to look for changes in the cervix. The health care provider will do a physical exam and may monitor your contractions. If you are not in true labor, the exam should show that your cervix is not dilating and your water has not broken. °If there are other health problems associated with your pregnancy, it is completely safe for you to be sent home with false labor. You may continue to have Braxton Hicks contractions until you go into true labor. °How to tell the difference between true labor and false labor °True labor °· Contractions last 30-70 seconds. °· Contractions become very regular. °· Discomfort is usually felt in the top of the uterus, and it spreads to the lower abdomen and low back. °· Contractions do not go away with walking. °· Contractions usually become more intense and increase in frequency. °· The cervix dilates and gets thinner. °False labor °· Contractions are usually shorter and not as strong as true labor contractions. °· Contractions are usually irregular. °· Contractions  are often felt in the front of the lower abdomen and in the groin. °· Contractions may go away when you walk around or change positions while lying down. °· Contractions get weaker and are shorter-lasting as time goes on. °· The cervix usually does not dilate or become thin. °Follow these instructions at home: °· Take over-the-counter and prescription medicines only as told by your health care provider. °· Keep up with your usual exercises and follow other instructions from your health care provider. °· Eat and drink lightly if you think you are going into labor. °· If Braxton Hicks contractions are making you uncomfortable: °? Change your position from lying down or resting to walking, or change from walking to resting. °? Sit and rest in a tub of warm water. °? Drink enough fluid to keep your urine pale yellow. Dehydration may cause these contractions. °? Do slow and deep breathing several times an hour. °· Keep all follow-up prenatal visits as told by your health care provider. This is important. °Contact a health care provider if: °· You have a fever. °· You have continuous pain in your abdomen. °Get help right away if: °· Your contractions become stronger, more regular, and closer together. °· You have fluid leaking or gushing from your vagina. °· You pass blood-tinged mucus (bloody show). °· You have bleeding from your vagina. °· You have low back pain that you never had before. °· You feel your baby’s head pushing down and causing pelvic pressure. °· Your baby is not moving inside you as much as it used to. °Summary °· Contractions that occur before labor are called Braxton   Hicks contractions, false labor, or practice contractions.  Braxton Hicks contractions are usually shorter, weaker, farther apart, and less regular than true labor contractions. True labor contractions usually become progressively stronger and regular and they become more frequent.  Manage discomfort from [facility] contractions by  changing position, resting in a warm bath, drinking plenty of water, or practicing deep breathing. This information is not intended to replace advice given to you by your health care provider. Make sure you discuss any questions you have with your health care provider. Document Released: 09/09/2016 Document Revised: 09/09/2016 Document Reviewed: 09/09/2016 Elsevier Interactive Patient Education  2018 [employer]. Preterm Labor and Birth Information Pregnancy normally lasts 39-41 weeks. Preterm labor is when labor starts early. It starts before you have been pregnant for 37 whole weeks. What are the risk factors for preterm labor? Preterm labor is more likely to occur in women who:  Have an infection while pregnant.  Have a cervix that is short.  Have gone into preterm labor before.  Have had surgery on their cervix.  Are younger than age 28.  Are older than age 78.  Are African American.  Are pregnant with two or more babies.  Take street drugs while pregnant.  Smoke while pregnant.  Do not gain enough weight while pregnant.  Got pregnant right after another pregnancy.  What are the symptoms of preterm labor? Symptoms of preterm labor include:  Cramps. The cramps may feel like the cramps some women get during their period. The cramps may happen with watery poop (diarrhea).  Pain in the belly (abdomen).  Pain in the lower back.  Regular contractions or tightening. It may feel like your belly is getting tighter.  Pressure in the lower belly that seems to get stronger.  More fluid (discharge) leaking from the vagina. The fluid may be watery or bloody.  Water breaking.  Why is it important to notice signs of preterm labor? Babies who are born early may not be fully developed. They have a higher chance for:  Long-term heart problems.  Long-term lung problems.  Trouble controlling body systems, like breathing.  Bleeding in the brain.  A condition called  cerebral palsy.  Learning difficulties.  Death.  These risks are highest for babies who are born before 9 weeks of pregnancy. How is preterm labor treated? Treatment depends on:  How long you were pregnant.  Your condition.  The health of your baby.  Treatment may involve:  Having a stitch (suture) placed in your cervix. When you give birth, your cervix opens so the baby can come out. The stitch keeps the cervix from opening too soon.  Staying at the hospital.  Taking or getting medicines, such as: ? Hormone medicines. ? Medicines to stop contractions. ? Medicines to help the babys lungs develop. ? Medicines to prevent your baby from having cerebral palsy.  What should I do if I am in preterm labor? If you think you are going into labor too soon, call your doctor right away. How can I prevent preterm labor?  Do not use any tobacco products. ? Examples of these are cigarettes, chewing tobacco, and e-cigarettes. ? If you need help quitting, ask your doctor.  Do not use street drugs.  Do not use any medicines unless you ask your doctor if they are safe for you.  Talk with your doctor before taking any herbal supplements.  Make sure you gain enough weight.  Watch for infection. If you think you might have an  infection, get it checked right away.  If you have gone into preterm labor before, tell your doctor. This information is not intended to replace advice given to you by your health care provider. Make sure you discuss any questions you have with your health care provider. Document Released: 07/23/2008 Document Revised: 10/07/2015 Document Reviewed: 09/17/2015 Elsevier Interactive Patient Education  2018 [employer].

## 2017-08-16 NOTE — MAU Note (Signed)
PT SAYS UC'S STARTED YESTERDAY- WORSE  AT 8PM-  CALLED  [facility]-  TOLD TO COME HERE     LAST SEX-  SAT.

## 2017-08-16 NOTE — MAU Provider Note (Signed)
[name] is a 22 y.o.  G2P0010 At [redacted]w[redacted]d here with complaints of abdominal pain and back pain.  She denies bleeding, leaking of fluid or decreased fetal movements.  History     CSN: [id number]  Arrival date and time: 08/16/17 0143   None     Chief Complaint  Patient presents with  . Abdominal Pain   Abdominal Pain  This is a new (started on Monday; she thought it was just normal but then it got worse and she started getting the pains more frequently. ) problem. The current episode started yesterday. The problem occurs constantly. The pain is at a severity of 9/10. The quality of the pain is cramping (feels like a really bad menstrual cramp). The abdominal pain radiates to the left flank and right flank. Exacerbated by: movement and walking make it worse. The pain is relieved by nothing.   She says that they come about one or two in an hour. They last about 5 to 30 seconds.   She says that some of them feel so bad she can't breathe.  OB History    Gravida  2   Para  0   Term  0   Preterm  0   AB  1   Living  0     SAB  1   TAB  0   Ectopic  0   Multiple  0   Live Births  0           Past Medical History:  Diagnosis Date  . Abdominal pain   . ADD (attention deficit disorder)   . Anxiety   . Arthritis   . Asthma   . 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 Cervicalgia   . Chronic abdominal pain   . Constipation   . Depression   . Ear mass   . Headache   . HSV infection   . Suicidal intent   . UTI (lower urinary tract infection) 05/2014  . Vomiting     Past Surgical History:  Procedure Laterality Date  . CHOLESTEATOMA EXCISION    . DILATION AND EVACUATION N/A 09/11/2016   Procedure: DILATATION AND CURRATAGE  2ND TRIMESTER;  Surgeon: Jonnie Kind, MD;  Location: [facility];  Service: Gynecology;  Laterality: N/A;  . IMPLANTATION BONE ANCHORED HEARING AID Right 04/2013  . MIDDLE EAR SURGERY     28 surgeries for  cholesteatoma  . TYMPANOPLASTY Left   . TYMPANOSTOMY      Family History  Problem Relation Age of Onset  . Cholelithiasis Mother   . Kidney disease Mother        stones  . Depression Mother   . Hypertension Maternal Grandmother   . Diabetes Maternal Grandmother   . Stroke Maternal Grandmother   . Seizures Maternal Grandmother   . Asthma Maternal Grandmother   . Hyperlipidemia Maternal Grandmother   . Thyroid disease Maternal Grandmother   . Asthma Brother   . Seizures Maternal Uncle   . Cancer Other        breast- great aunt  . Celiac disease Neg Hx     Social History   Tobacco Use  . Smoking status: Former Smoker    Packs/day: 0.00    Years: 1.00    Pack years: 0.00    Types: Cigarettes  . Smokeless tobacco: Never Used  . Tobacco comment: smokes 10 cig daily  Substance Use Topics  . Alcohol use: No  Alcohol/week: 0.0 oz  . Drug use: No    Allergies:  Allergies  Allergen Reactions  . Keflex [Cephalexin] Other (See Comments)    Reaction:  Elevated liver enzymes   . Other Other (See Comments)    Pt is allergic to surgical glue.  Reaction:  Infection   . Adhesive [Tape] Rash    Medications Prior to Admission  Medication Sig Dispense Refill Last Dose  . acetaminophen (TYLENOL) 500 MG tablet Take 500 mg by mouth every 6 (six) hours as needed for moderate pain.    Taking  . acetaminophen-codeine (TYLENOL #3) 300-30 MG tablet Take 1-2 tablets by mouth every 4 (four) hours as needed for up to 5 days for moderate pain. 30 tablet 0   . albuterol (PROVENTIL) (2.5 MG/3ML) 0.083% nebulizer solution Take 3 mLs (2.5 mg total) by nebulization every 6 (six) hours as needed for wheezing or shortness of breath. 75 mL 12 Taking  . cyclobenzaprine (FLEXERIL) 10 MG tablet Take 1 tablet (10 mg total) by mouth 3 (three) times daily as needed for muscle spasms. 30 tablet 1   . diphenhydrAMINE (BENADRYL) 25 MG tablet Take 25 mg by mouth every 6 (six) hours as needed for allergies or  sleep.   Taking  . Doxylamine-Pyridoxine (DICLEGIS) 10-10 MG TBEC 2 tabs q hs, if sx persist add 1 tab q am on day 3, if sx persist add 1 tab q afternoon on day 4 100 tablet 6 Taking  . hydrocortisone (ANUSOL-HC) 2.5 % rectal cream Place 1 application rectally 2 (two) times daily. 30 g 0 Taking  . Olopatadine HCl (PATADAY) 0.2 % SOLN Apply 1 drop to eye daily as needed (for allergies).   Taking  . omeprazole (PRILOSEC) 20 MG capsule Take 1 capsule (20 mg total) by mouth daily. 30 capsule 3 Taking  . pantoprazole (PROTONIX) 20 MG tablet Take 1 tablet (20 mg total) by mouth daily. (Patient not taking: Reported on 08/05/2017) 30 tablet 6 Not Taking  . prenatal vitamin w/FE, FA (PRENATAL 1 + 1) 27-1 MG TABS tablet Take 1 tablet by mouth daily at 12 noon. (Patient not taking: Reported on 08/15/2017) 30 each 12 Not Taking  . ranitidine (ZANTAC) 75 MG tablet Take 1 tablet (75 mg total) by mouth 2 (two) times daily. (Patient not taking: Reported on 08/15/2017) 60 tablet 3 Not Taking  . sertraline (ZOLOFT) 25 MG tablet Take 1 tablet (25 mg total) by mouth daily. (Patient not taking: Reported on 08/05/2017) 30 tablet 6 Not Taking    Review of Systems  HENT: Negative.   Respiratory: Negative.   Cardiovascular: Negative.   Gastrointestinal: Positive for abdominal pain.  Genitourinary: Negative for vaginal bleeding and vaginal discharge.  Musculoskeletal: Negative.   Neurological: Negative.    Physical Exam   Blood pressure 111/62, pulse 91, temperature 98.1 F (36.7 C), temperature source Oral, resp. rate 20, height 4\' 11"  (1.499 m), weight 187 lb 8 oz (85 kg), last menstrual period 02/10/2017.  Physical Exam  Constitutional: She is oriented to person, place, and time. She appears well-developed.  HENT:  Head: Normocephalic.  Neck: Normal range of motion.  GI: Soft.  Genitourinary:  Genitourinary Comments: NEFG; cervic is closed and soft. No CMT, suprapubic or adnexal tenderness.   Musculoskeletal:  Normal range of motion.  Neurological: She is alert and oriented to person, place, and time.  Skin: Skin is warm and dry.  Psychiatric: She has a normal mood and affect.    MAU Course  Procedures  MDM -patient closes her eyes and winces when she has the pains, between pains she is talkative and alert.  -Wet prep-pos for clue -GC chlamydia pending -FFn -negative -10 mg of procardia; BH contractions now relieved.  Cervix is still closed; unchanged throughout MAU stay.  -NST: 130 bpm, mod var, present acel, neg decels. Uterine irratability.  Assessment and Plan   1. Bacterial vaginosis   2. Encounter for supervision of other normal pregnancy in second trimester   3. Abdominal pain affecting pregnancy    2. Patient stable for discharge with return precautions and RX for Flagyl.    3. Patient to keep prenatal visit on Thursday.   Mervyn Skeeters Kooistra CNM 08/16/2017, 2:33 AM

## 2017-08-17 LAB — GC/CHLAMYDIA PROBE AMP (~~LOC~~) NOT AT ARMC
CHLAMYDIA, DNA PROBE: NEGATIVE
Neisseria Gonorrhea: NEGATIVE

## 2017-08-18 ENCOUNTER — Ambulatory Visit (INDEPENDENT_AMBULATORY_CARE_PROVIDER_SITE_OTHER): Payer: Medicaid Other | Admitting: Women's Health

## 2017-08-18 ENCOUNTER — Encounter: Payer: Self-pay | Admitting: Women's Health

## 2017-08-18 ENCOUNTER — Other Ambulatory Visit: Payer: Medicaid Other

## 2017-08-18 VITALS — BP 120/70 | HR 97 | Wt 189.0 lb

## 2017-08-18 DIAGNOSIS — Z3482 Encounter for supervision of other normal pregnancy, second trimester: Secondary | ICD-10-CM

## 2017-08-18 DIAGNOSIS — Z331 Pregnant state, incidental: Secondary | ICD-10-CM

## 2017-08-18 DIAGNOSIS — Z3A27 27 weeks gestation of pregnancy: Secondary | ICD-10-CM

## 2017-08-18 DIAGNOSIS — Z131 Encounter for screening for diabetes mellitus: Secondary | ICD-10-CM

## 2017-08-18 DIAGNOSIS — Z1389 Encounter for screening for other disorder: Secondary | ICD-10-CM

## 2017-08-18 LAB — POCT URINALYSIS DIPSTICK
Blood, UA: NEGATIVE
GLUCOSE UA: NEGATIVE
Ketones, UA: NEGATIVE
LEUKOCYTES UA: NEGATIVE
Nitrite, UA: NEGATIVE
Protein, UA: NEGATIVE

## 2017-08-18 NOTE — Patient Instructions (Addendum)
Stacey Cook, I greatly value your feedback.  If you receive a survey following your visit with [name] today, we appreciate you taking the time to fill it out.  Thanks, [name], CNM, WHNP-BC   Call the office [phone]) or go to [facility] if:  You begin to have strong, frequent contractions  Your water breaks.  Sometimes it is a big gush of fluid, sometimes it is just a trickle that keeps getting your panties wet or running down your legs  You have vaginal bleeding.  It is normal to have a small amount of spotting if your cervix was checked.   You don't feel your baby moving like normal.  If you don't, get you something to eat and drink and lay down and focus on feeling your baby move.  You should feel at least 10 movements in 2 hours.  If you don't, you should call the office or go to [facility].    Tdap Vaccine  It is recommended that you get the Tdap vaccine during the third trimester of EACH pregnancy to help protect your baby from getting pertussis (whooping cough)  27-36 weeks is the BEST time to do this so that you can pass the protection on to your baby. During pregnancy is better than after pregnancy, but if you are unable to get it during pregnancy it will be offered at the hospital.   You can get this vaccine at the health department or your family doctor  Everyone who will be around your baby should also be up-to-date on their vaccines. Adults (who are not pregnant) only need 1 dose of Tdap during adulthood.   Tips to Help You Sleep Better:   Get into a bedtime routine, try to do the same thing every night before going to bed to try to help your body wind down  Warm baths  Avoid caffeine for at least 3 hours before going to sleep   Keep your room at a slightly cooler temperature, can try running a fan  Turn off TV, lights, phone, electronics  Lots of pillows if needed to help you get comfortable  Lavender scented items can help you sleep. You can place  lavender essential oil on a cotton ball and place under your pillowcase, or place in a diffuser. [employer] has a lavender scented sleep line (plug-ins, sprays, etc). Look in the pillow aisle for lavender scented pillows.   If none of the above things help, you can try 1/2 to 1 tablet of benadryl, unisom, or tylenol pm. Do not take this every night, only when you really need it.     Third Trimester of Pregnancy The third trimester is from week 29 through week 42, months 7 through 9. The third trimester is a time when the fetus is growing rapidly. At the end of the ninth month, the fetus is about 20 inches in length and weighs 6-10 pounds.  BODY CHANGES Your body goes through many changes during pregnancy. The changes vary from woman to woman.   Your weight will continue to increase. You can expect to gain 25-35 pounds (11-16 kg) by the end of the pregnancy.  You may begin to get stretch marks on your hips, abdomen, and breasts.  You may urinate more often because the fetus is moving lower into your pelvis and pressing on your bladder.  You may develop or continue to have heartburn as a result of your pregnancy.  You may develop constipation because certain hormones are  causing the muscles that push waste through your intestines to slow down.  You may develop hemorrhoids or swollen, bulging veins (varicose veins).  You may have pelvic pain because of the weight gain and pregnancy hormones relaxing your joints between the bones in your pelvis. Backaches may result from overexertion of the muscles supporting your posture.  You may have changes in your hair. These can include thickening of your hair, rapid growth, and changes in texture. Some women also have hair loss during or after pregnancy, or hair that feels dry or thin. Your hair will most likely return to normal after your baby is born.  Your breasts will continue to grow and be tender. A yellow discharge may leak from your breasts called  colostrum.  Your belly button may stick out.  You may feel short of breath because of your expanding uterus.  You may notice the fetus "dropping," or moving lower in your abdomen.  You may have a bloody mucus discharge. This usually occurs a few days to a week before labor begins.  Your cervix becomes thin and soft (effaced) near your due date. WHAT TO EXPECT AT YOUR PRENATAL EXAMS  You will have prenatal exams every 2 weeks until week 36. Then, you will have weekly prenatal exams. During a routine prenatal visit:  You will be weighed to make sure you and the fetus are growing normally.  Your blood pressure is taken.  Your abdomen will be measured to track your baby's growth.  The fetal heartbeat will be listened to.  Any test results from the previous visit will be discussed.  You may have a cervical check near your due date to see if you have effaced. At around 36 weeks, your caregiver will check your cervix. At the same time, your caregiver will also perform a test on the secretions of the vaginal tissue. This test is to determine if a type of bacteria, Group B streptococcus, is present. Your caregiver will explain this further. Your caregiver may ask you:  What your birth plan is.  How you are feeling.  If you are feeling the baby move.  If you have had any abnormal symptoms, such as leaking fluid, bleeding, severe headaches, or abdominal cramping.  If you have any questions. Other tests or screenings that may be performed during your third trimester include:  Blood tests that check for low iron levels (anemia).  Fetal testing to check the health, activity level, and growth of the fetus. Testing is done if you have certain medical conditions or if there are problems during the pregnancy. FALSE LABOR You may feel small, irregular contractions that eventually go away. These are called Braxton Hicks contractions, or false labor. Contractions may last for hours, days, or  even weeks before true labor sets in. If contractions come at regular intervals, intensify, or become painful, it is best to be seen by your caregiver.  SIGNS OF LABOR   Menstrual-like cramps.  Contractions that are 5 minutes apart or less.  Contractions that start on the top of the uterus and spread down to the lower abdomen and back.  A sense of increased pelvic pressure or back pain.  A watery or bloody mucus discharge that comes from the vagina. If you have any of these signs before the 37th week of pregnancy, call your caregiver right away. You need to go to the hospital to get checked immediately. HOME CARE INSTRUCTIONS   Avoid all smoking, herbs, alcohol, and unprescribed drugs. These chemicals  affect the formation and growth of the baby.  Follow your caregiver's instructions regarding medicine use. There are medicines that are either safe or unsafe to take during pregnancy.  Exercise only as directed by your caregiver. Experiencing uterine cramps is a good sign to stop exercising.  Continue to eat regular, healthy meals.  Wear a good support bra for breast tenderness.  Do not use hot tubs, steam rooms, or saunas.  Wear your seat belt at all times when driving.  Avoid raw meat, uncooked cheese, cat litter boxes, and soil used by cats. These carry germs that can cause birth defects in the baby.  Take your prenatal vitamins.  Try taking a stool softener (if your caregiver approves) if you develop constipation. Eat more high-fiber foods, such as fresh vegetables or fruit and whole grains. Drink plenty of fluids to keep your urine clear or pale yellow.  Take warm sitz baths to soothe any pain or discomfort caused by hemorrhoids. Use hemorrhoid cream if your caregiver approves.  If you develop varicose veins, wear support hose. Elevate your feet for 15 minutes, 3-4 times a day. Limit salt in your diet.  Avoid heavy lifting, wear low heal shoes, and practice good  posture.  Rest a lot with your legs elevated if you have leg cramps or low back pain.  Visit your dentist if you have not gone during your pregnancy. Use a soft toothbrush to brush your teeth and be gentle when you floss.  A sexual relationship may be continued unless your caregiver directs you otherwise.  Do not travel far distances unless it is absolutely necessary and only with the approval of your caregiver.  Take prenatal classes to understand, practice, and ask questions about the labor and delivery.  Make a trial run to the hospital.  Pack your hospital bag.  Prepare the baby's nursery.  Continue to go to all your prenatal visits as directed by your caregiver. SEEK MEDICAL CARE IF:  You are unsure if you are in labor or if your water has broken.  You have dizziness.  You have mild pelvic cramps, pelvic pressure, or nagging pain in your abdominal area.  You have persistent nausea, vomiting, or diarrhea.  You have a bad smelling vaginal discharge.  You have pain with urination. SEEK IMMEDIATE MEDICAL CARE IF:   You have a fever.  You are leaking fluid from your vagina.  You have spotting or bleeding from your vagina.  You have severe abdominal cramping or pain.  You have rapid weight loss or gain.  You have shortness of breath with chest pain.  You notice sudden or extreme swelling of your face, hands, ankles, feet, or legs.  You have not felt your baby move in over an hour.  You have severe headaches that do not go away with medicine.  You have vision changes. Document Released: 04/20/2001 Document Revised: 05/01/2013 Document Reviewed: 06/27/2012 [facility] Patient Information 2015 [place], [place]. This information is not intended to replace advice given to you by your health care provider. Make sure you discuss any questions you have with your health care provider.

## 2017-08-18 NOTE — Progress Notes (Signed)
   LOW-RISK PREGNANCY VISIT Patient name: Stacey Cook MRN [id number]  Date of birth: 1995/09/04 Chief Complaint:   Routine Prenatal Visit (PN2)  History of Present Illness:   Stacey Cook is a 22 y.o. G2P0010 female at [redacted]w[redacted]d with an Estimated Date of Delivery: 11/17/17 being seen today for ongoing management of a low-risk pregnancy.  Today she reports trouble sleeping. Had to go to [facility] the other night for uc's. Initally reported decreased fm, but states she counted 45 movements in 1 hour. Discussed fetal kick counts. Contractions: Irregular.  .  Movement: Present. denies leaking of fluid. Review of Systems:   Pertinent items are noted in HPI Denies abnormal vaginal discharge w/ itching/odor/irritation, headaches, visual changes, shortness of breath, chest pain, abdominal pain, severe nausea/vomiting, or problems with urination or bowel movements unless otherwise stated above. Pertinent History Reviewed:  Reviewed past medical,surgical, social, obstetrical and family history.  Reviewed problem list, medications and allergies. Physical Assessment:   Vitals:   08/18/17 0856  BP: 120/70  Pulse: 97  Weight: 189 lb (85.7 kg)  Body mass index is 38.17 kg/m.        Physical Examination:   General appearance: Well appearing, and in no distress  Mental status: Alert, oriented to person, place, and time  Skin: Warm & dry  Cardiovascular: Normal heart rate noted  Respiratory: Normal respiratory effort, no distress  Abdomen: Soft, gravid, nontender  Pelvic: Cervical exam deferred         Extremities: Edema: Trace  Fetal Status: Fetal Heart Rate (bpm): 147 Fundal Height: 29 cm Movement: Present    Results for orders placed or performed in visit on 08/18/17 (from the past 24 hour(s))  POCT urinalysis dipstick   Collection Time: 08/18/17  9:04 AM  Result Value Ref Range   Color, UA     Clarity, UA     Glucose, UA neg    Bilirubin, UA     Ketones, UA neg    Spec Grav, UA  1.010 - 1.025    Blood, UA neg    pH, UA  5.0 - 8.0   Protein, UA neg    Urobilinogen, UA  0.2 or 1.0 E.U./dL   Nitrite, UA neg    Leukocytes, UA Negative Negative   Appearance     Odor      Assessment & Plan:  1) Low-risk pregnancy G2P0010 at [redacted]w[redacted]d with an Estimated Date of Delivery: 11/17/17   2) Trouble sleeping, gave printed relief measures    Meds: No orders of the defined types were placed in this encounter.  Labs/procedures today: pn2  Plan:  Continue routine obstetrical care   Reviewed: Preterm labor symptoms and general obstetric precautions including but not limited to vaginal bleeding, contractions, leaking of fluid and fetal movement were reviewed in detail with the patient. Recommended Tdap at HD/PCP per CDC guidelines.  All questions were answered  Follow-up: Return in about 1 month (around 09/15/2017) for [place].  Orders Placed This Encounter  Procedures  . POCT urinalysis dipstick   Roma Schanz CNM, [facility] 08/18/2017 9:51 AM

## 2017-08-19 LAB — CBC
HEMOGLOBIN: 10 g/dL — AB (ref 11.1–15.9)
Hematocrit: 30.4 % — ABNORMAL LOW (ref 34.0–46.6)
MCH: 28.7 pg (ref 26.6–33.0)
MCHC: 32.9 g/dL (ref 31.5–35.7)
MCV: 87 fL (ref 79–97)
PLATELETS: 215 10*3/uL (ref 150–379)
RBC: 3.49 x10E6/uL — AB (ref 3.77–5.28)
RDW: 14.1 % (ref 12.3–15.4)
WBC: 13.4 10*3/uL — ABNORMAL HIGH (ref 3.4–10.8)

## 2017-08-19 LAB — GLUCOSE TOLERANCE, 2 HOURS W/ 1HR
Glucose, 1 hour: 153 mg/dL (ref 65–179)
Glucose, 2 hour: 124 mg/dL (ref 65–152)
Glucose, Fasting: 95 mg/dL — ABNORMAL HIGH (ref 65–91)

## 2017-08-19 LAB — RPR: RPR: NONREACTIVE

## 2017-08-19 LAB — HIV ANTIBODY (ROUTINE TESTING W REFLEX): HIV SCREEN 4TH GENERATION: NONREACTIVE

## 2017-08-19 LAB — ANTIBODY SCREEN: Antibody Screen: NEGATIVE

## 2017-08-24 ENCOUNTER — Other Ambulatory Visit: Payer: Self-pay | Admitting: Women's Health

## 2017-08-24 ENCOUNTER — Telehealth: Payer: Self-pay | Admitting: *Deleted

## 2017-08-24 ENCOUNTER — Encounter: Payer: Self-pay | Admitting: Women's Health

## 2017-08-24 DIAGNOSIS — O24410 Gestational diabetes mellitus in pregnancy, diet controlled: Secondary | ICD-10-CM

## 2017-08-24 DIAGNOSIS — Z8632 Personal history of gestational diabetes: Secondary | ICD-10-CM | POA: Insufficient documentation

## 2017-08-24 MED ORDER — FERROUS SULFATE 325 (65 FE) MG PO TABS: 325.0000 mg | ORAL_TABLET | Freq: Two times a day (BID) | ORAL | 3 refills | Status: DC

## 2017-08-24 NOTE — Telephone Encounter (Signed)
Informed patient she did not pass glucose test.  Referral was sent to dietician and should get a call from them in a week, if not to let us know.  Advised she will need to check her blood sugar 4 times daily and bring log with her to her appointment.  Also made aware her iron is low, to add iron supplement and to increase diet in green leafy veggies, nuts, beans, red meat, etc.  Verbalized understanding and all other questions answered.

## 2017-08-24 NOTE — Telephone Encounter (Signed)
Patient states she cannot afford the iron supplements at this time.  She is taking the flintstone vitamins so advised to try to start taking the PNV with iron that were prescribed earlier in the pregnancy.  Stated she try.

## 2017-08-25 ENCOUNTER — Inpatient Hospital Stay (HOSPITAL_COMMUNITY)
Admission: AD | Admit: 2017-08-25 | Discharge: 2017-08-25 | Disposition: A | Discharge status: Home / Self Care | Payer: Medicaid Other | Source: Ambulatory Visit | Attending: Obstetrics and Gynecology | Admitting: Obstetrics and Gynecology

## 2017-08-25 ENCOUNTER — Encounter (HOSPITAL_COMMUNITY): Payer: Self-pay

## 2017-08-25 DIAGNOSIS — R1010 Upper abdominal pain, unspecified: Secondary | ICD-10-CM

## 2017-08-25 DIAGNOSIS — R1030 Lower abdominal pain, unspecified: Secondary | ICD-10-CM | POA: Insufficient documentation

## 2017-08-25 DIAGNOSIS — R1013 Epigastric pain: Secondary | ICD-10-CM | POA: Insufficient documentation

## 2017-08-25 DIAGNOSIS — O99343 Other mental disorders complicating pregnancy, third trimester: Secondary | ICD-10-CM | POA: Diagnosis not present

## 2017-08-25 DIAGNOSIS — N859 Noninflammatory disorder of uterus, unspecified: Secondary | ICD-10-CM

## 2017-08-25 DIAGNOSIS — J45909 Unspecified asthma, uncomplicated: Secondary | ICD-10-CM | POA: Insufficient documentation

## 2017-08-25 DIAGNOSIS — O26893 Other specified pregnancy related conditions, third trimester: Secondary | ICD-10-CM

## 2017-08-25 DIAGNOSIS — O9989 Other specified diseases and conditions complicating pregnancy, childbirth and the puerperium: Secondary | ICD-10-CM

## 2017-08-25 DIAGNOSIS — M546 Pain in thoracic spine: Secondary | ICD-10-CM | POA: Diagnosis not present

## 2017-08-25 DIAGNOSIS — Z79899 Other long term (current) drug therapy: Secondary | ICD-10-CM | POA: Insufficient documentation

## 2017-08-25 DIAGNOSIS — Z3A28 28 weeks gestation of pregnancy: Secondary | ICD-10-CM

## 2017-08-25 DIAGNOSIS — F419 Anxiety disorder, unspecified: Secondary | ICD-10-CM | POA: Diagnosis not present

## 2017-08-25 DIAGNOSIS — O99513 Diseases of the respiratory system complicating pregnancy, third trimester: Secondary | ICD-10-CM | POA: Insufficient documentation

## 2017-08-25 DIAGNOSIS — Z87891 Personal history of nicotine dependence: Secondary | ICD-10-CM | POA: Insufficient documentation

## 2017-08-25 DIAGNOSIS — O99891 Other specified diseases and conditions complicating pregnancy: Secondary | ICD-10-CM

## 2017-08-25 DIAGNOSIS — N858 Other specified noninflammatory disorders of uterus: Secondary | ICD-10-CM

## 2017-08-25 DIAGNOSIS — Z881 Allergy status to other antibiotic agents status: Secondary | ICD-10-CM | POA: Insufficient documentation

## 2017-08-25 DIAGNOSIS — M549 Dorsalgia, unspecified: Secondary | ICD-10-CM

## 2017-08-25 HISTORY — DX: Angina pectoris, unspecified: I20.9

## 2017-08-25 HISTORY — DX: Gestational diabetes mellitus in pregnancy, unspecified control: O24.419

## 2017-08-25 LAB — COMPREHENSIVE METABOLIC PANEL
ALBUMIN: 2.7 g/dL — AB (ref 3.5–5.0)
ALT: 18 U/L (ref 14–54)
AST: 31 U/L (ref 15–41)
Alkaline Phosphatase: 181 U/L — ABNORMAL HIGH (ref 38–126)
Anion gap: 10 (ref 5–15)
BUN: 6 mg/dL (ref 6–20)
CHLORIDE: 105 mmol/L (ref 101–111)
CO2: 20 mmol/L — AB (ref 22–32)
Calcium: 8.6 mg/dL — ABNORMAL LOW (ref 8.9–10.3)
Creatinine, Ser: 0.41 mg/dL — ABNORMAL LOW (ref 0.44–1.00)
GFR calc Af Amer: >60 mL/min (ref >60)
GFR calc non Af Amer: >60 mL/min (ref >60)
GLUCOSE: 108 mg/dL — AB (ref 65–99)
Potassium: 3.7 mmol/L (ref 3.5–5.1)
SODIUM: 135 mmol/L (ref 135–145)
Total Bilirubin: 0.5 mg/dL (ref 0.3–1.2)
Total Protein: 6.8 g/dL (ref 6.5–8.1)

## 2017-08-25 LAB — URINALYSIS, ROUTINE W REFLEX MICROSCOPIC
Bilirubin Urine: NEGATIVE
Glucose, UA: NEGATIVE mg/dL
Hgb urine dipstick: NEGATIVE
Ketones, ur: NEGATIVE mg/dL
LEUKOCYTES UA: NEGATIVE
Nitrite: NEGATIVE
PROTEIN: NEGATIVE mg/dL
SPECIFIC GRAVITY, URINE: 1.009 (ref 1.005–1.030)
pH: 7 (ref 5.0–8.0)

## 2017-08-25 MED ORDER — NIFEDIPINE 10 MG PO CAPS: 10.0000 mg | ORAL_CAPSULE | Freq: Once | ORAL | Status: DC

## 2017-08-25 MED ORDER — TERBUTALINE SULFATE 1 MG/ML IJ SOLN
0.2500 mg | Freq: Once | INTRAMUSCULAR | Status: AC
Start: 2017-08-25 — End: 2017-08-25
  Administered 2017-08-25: 0.25 mg via SUBCUTANEOUS
  Filled 2017-08-25: qty 1

## 2017-08-25 MED ORDER — CYCLOBENZAPRINE HCL 10 MG PO TABS
10.0000 mg | ORAL_TABLET | Freq: Once | ORAL | Status: AC
Start: 2017-08-25 — End: 2017-08-25
  Administered 2017-08-25: 10 mg via ORAL
  Filled 2017-08-25: qty 1

## 2017-08-25 MED ORDER — GI COCKTAIL ~~LOC~~
30.0000 mL | Freq: Once | ORAL | Status: AC
  Administered 2017-08-25: 30 mL via ORAL
  Filled 2017-08-25: qty 30

## 2017-08-25 MED ORDER — NIFEDIPINE 10 MG PO CAPS
10.0000 mg | ORAL_CAPSULE | Freq: Once | ORAL | Status: DC
  Filled 2017-08-25: qty 1

## 2017-08-25 NOTE — MAU Note (Signed)
Woke up to a sharp pain her back at 0100.  Abdominal pain started around 30 mins after and pain has become intolerable.  Pain in abdomen is intermittent, back pain is constant.  No LOF/VB.  No hx of preterm labor.  Reports decreased fetal movements.

## 2017-08-25 NOTE — Discharge Instructions (Signed)
Back Pain, Adult Many adults have back pain from time to time. Common causes of back pain include:  A strained muscle or ligament.  Wear and tear (degeneration) of the spinal disks.  Arthritis.  A hit to the back.  Back pain can be short-lived (acute) or last a long time (chronic). A physical exam, lab tests, and imaging studies may be done to find the cause of your pain. Follow these instructions at home: Managing pain and stiffness  Take over-the-counter and prescription medicines only as told by your health care provider.  If directed, apply heat to the affected area as often as told by your health care provider. Use the heat source that your health care provider recommends, such as a moist heat pack or a heating pad. ? Place a towel between your skin and the heat source. ? Leave the heat on for 20-30 minutes. ? Remove the heat if your skin turns bright red. This is especially important if you are unable to feel pain, heat, or cold. You have a greater risk of getting burned.  If directed, apply ice to the injured area: ? Put ice in a plastic bag. ? Place a towel between your skin and the bag. ? Leave the ice on for 20 minutes, 2-3 times a day for the first 2-3 days. Activity  Do not stay in bed. Resting more than 1-2 days can delay your recovery.  Take short walks on even surfaces as soon as you are able. Try to increase the length of time you walk each day.  Do not sit, drive, or stand in one place for more than 30 minutes at a time. Sitting or standing for long periods of time can put stress on your back.  Use proper lifting techniques. When you bend and lift, use positions that put less stress on your back: ? [place] your knees. ? Keep the load close to your body. ? Avoid twisting.  Exercise regularly as told by your health care provider. Exercising will help your back heal faster. This also helps prevent back injuries by keeping muscles strong and flexible.  Your health  care provider may recommend that you see a physical therapist. This person can help you come up with a safe exercise program. Do any exercises as told by your physical therapist. Lifestyle  Maintain a healthy weight. Extra weight puts stress on your back and makes it difficult to have good posture.  Avoid activities or situations that make you feel anxious or stressed. Learn ways to manage anxiety and stress. One way to manage stress is through exercise. Stress and anxiety increase muscle tension and can make back pain worse. General instructions  Sleep on a firm mattress in a comfortable position. Try lying on your side with your knees slightly bent. If you lie on your back, put a pillow under your knees.  Follow your treatment plan as told by your health care provider. This may include: ? Cognitive or behavioral therapy. ? Acupuncture or massage therapy. ? Meditation or yoga. Contact a health care provider if:  You have pain that is not relieved with rest or medicine.  You have increasing pain going down into your legs or buttocks.  Your pain does not improve in 2 weeks.  You have pain at night.  You lose weight.  You have a fever or chills. Get help right away if:  You develop new bowel or bladder control problems.  You have unusual weakness or numbness in your arms  or legs.  You develop nausea or vomiting.  You develop abdominal pain.  You feel faint. Summary  Many adults have back pain from time to time. A physical exam, lab tests, and imaging studies may be done to find the cause of your pain.  Use proper lifting techniques. When you bend and lift, use positions that put less stress on your back.  Take over-the-counter and prescription medicines and apply heat or ice as directed by your health care provider. This information is not intended to replace advice given to you by your health care provider. Make sure you discuss any questions you have with your health care  provider. Document Released: 04/26/2005 Document Revised: 05/31/2016 Document Reviewed: 05/31/2016 Elsevier Interactive Patient Education  2018 [employer]. Preterm Labor and Birth Information Pregnancy normally lasts 39-41 weeks. Preterm labor is when labor starts early. It starts before you have been pregnant for 37 whole weeks. What are the risk factors for preterm labor? Preterm labor is more likely to occur in women who:  Have an infection while pregnant.  Have a cervix that is short.  Have gone into preterm labor before.  Have had surgery on their cervix.  Are younger than age 25.  Are older than age 16.  Are African American.  Are pregnant with two or more babies.  Take street drugs while pregnant.  Smoke while pregnant.  Do not gain enough weight while pregnant.  Got pregnant right after another pregnancy.  What are the symptoms of preterm labor? Symptoms of preterm labor include:  Cramps. The cramps may feel like the cramps some women get during their period. The cramps may happen with watery poop (diarrhea).  Pain in the belly (abdomen).  Pain in the lower back.  Regular contractions or tightening. It may feel like your belly is getting tighter.  Pressure in the lower belly that seems to get stronger.  More fluid (discharge) leaking from the vagina. The fluid may be watery or bloody.  Water breaking.  Why is it important to notice signs of preterm labor? Babies who are born early may not be fully developed. They have a higher chance for:  Long-term heart problems.  Long-term lung problems.  Trouble controlling body systems, like breathing.  Bleeding in the brain.  A condition called cerebral palsy.  Learning difficulties.  Death.  These risks are highest for babies who are born before 8 weeks of pregnancy. How is preterm labor treated? Treatment depends on:  How long you were pregnant.  Your condition.  The health of your  baby.  Treatment may involve:  Having a stitch (suture) placed in your cervix. When you give birth, your cervix opens so the baby can come out. The stitch keeps the cervix from opening too soon.  Staying at the hospital.  Taking or getting medicines, such as: ? Hormone medicines. ? Medicines to stop contractions. ? Medicines to help the babys lungs develop. ? Medicines to prevent your baby from having cerebral palsy.  What should I do if I am in preterm labor? If you think you are going into labor too soon, call your doctor right away. How can I prevent preterm labor?  Do not use any tobacco products. ? Examples of these are cigarettes, chewing tobacco, and e-cigarettes. ? If you need help quitting, ask your doctor.  Do not use street drugs.  Do not use any medicines unless you ask your doctor if they are safe for you.  Talk with your doctor before  taking any herbal supplements.  Make sure you gain enough weight.  Watch for infection. If you think you might have an infection, get it checked right away.  If you have gone into preterm labor before, tell your doctor. This information is not intended to replace advice given to you by your health care provider. Make sure you discuss any questions you have with your health care provider. Document Released: 07/23/2008 Document Revised: 10/07/2015 Document Reviewed: 09/17/2015 Elsevier Interactive Patient Education  2018 [employer].

## 2017-08-25 NOTE — MAU Provider Note (Signed)
Chief Complaint:  Back Pain   First Provider Initiated Contact with Patient 08/25/17 0358     HPI: Stacey Cook is a 22 y.o. G2P0010 at 23w0dwho presents to maternity admissions reporting epigastric pain and some lower abdominal pains. Also c/o mid-back pain.  Abdominal pain comes and goes.  Mostly epigastric.  Has a history or elevated liver enzymes due to Tylenol. She reports good fetal movement, denies LOF, vaginal bleeding, vaginal itching/burning, urinary symptoms, h/a, dizziness, n/v, diarrhea, constipation or fever/chills.  She denies headache, visual changes or RUQ abdominal pain.   Very hyper-reactive to any pain.  Speaks very comfortably in conversation, then if she has a pain, gasps and cries out.  Then returns to conversation.  States Dr Glo Herring gave her Rx for Tylenol with codeine but her little brother "colored on the prescription".  Followed at [facility]  Fetal Fibronectin negative last week Requests test of LFTs due to upper abdominal pain "feeling like it did when my LFTs were up".  Back Pain  This is a recurrent problem. The current episode started yesterday. The problem occurs intermittently. The problem is unchanged. The pain is present in the thoracic spine. The quality of the pain is described as aching, cramping and stabbing. The pain does not radiate. The symptoms are aggravated by twisting and lying down. Stiffness is present all day. Associated symptoms include abdominal pain. Pertinent negatives include no dysuria, fever, headaches or paresthesias. She has tried nothing for the symptoms.    RN Note: Woke up to a sharp pain her back at 0100.  Abdominal pain started around 30 mins after and pain has become intolerable.  Pain in abdomen is intermittent, back pain is constant.  No LOF/VB.  No hx of preterm labor.  Reports decreased fetal movements.    Past Medical History: Past Medical History:  Diagnosis Date  . Abdominal pain   . ADD (attention deficit disorder)    . Anginal pain ([place])   . Anxiety   . Arthritis   . Asthma   . 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 Cervicalgia   . Chronic abdominal pain   . Constipation   . Depression   . Ear mass   . Gestational diabetes   . Headache   . HSV infection   . Suicidal intent   . UTI (lower urinary tract infection) 05/2014  . Vomiting     Past obstetric history: OB History  Gravida Para Term Preterm AB Living  2 0 0 0 1 0  SAB TAB Ectopic Multiple Live Births  1 0 0 0 0    # Outcome Date GA Lbr Len/2nd Weight Sex Delivery Anes PTL Lv  2 Current           1 SAB 09/11/16 [redacted]w[redacted]d           Past Surgical History: Past Surgical History:  Procedure Laterality Date  . CHOLESTEATOMA EXCISION    . DILATION AND EVACUATION N/A 09/11/2016   Procedure: DILATATION AND CURRATAGE  2ND TRIMESTER;  Surgeon: Jonnie Kind, MD;  Location: [facility];  Service: Gynecology;  Laterality: N/A;  . IMPLANTATION BONE ANCHORED HEARING AID Right 04/2013  . 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 TYMPANOPLASTY Left   . TYMPANOSTOMY      Family History: Family History  Problem Relation Age of Onset  . Cholelithiasis Mother   . Kidney disease Mother        stones  .  Depression Mother   . Hypertension Maternal Grandmother   . Diabetes Maternal Grandmother   . Stroke Maternal Grandmother   . Seizures Maternal Grandmother   . Asthma Maternal Grandmother   . Hyperlipidemia Maternal Grandmother   . Thyroid disease Maternal Grandmother   . Asthma Brother   . Seizures Maternal Uncle   . Cancer Other        breast- great aunt  . Celiac disease Neg Hx     Social History: Social History   Tobacco Use  . Smoking status: Former Smoker    Packs/day: 0.00    Years: 1.00    Pack years: 0.00    Types: Cigarettes  . Smokeless tobacco: Never Used  . Tobacco comment: smokes 10 cig daily  Substance Use Topics  . Alcohol use: No    Alcohol/week: 0.0 oz  . Drug use:  No    Allergies:  Allergies  Allergen Reactions  . Keflex [Cephalexin] Other (See Comments)    Reaction:  Elevated liver enzymes   . Other Other (See Comments)    Pt is allergic to surgical glue.  Reaction:  Infection   . Adhesive [Tape] Rash    Meds:  Medications Prior to Admission  Medication Sig Dispense Refill Last Dose  . acetaminophen (TYLENOL) 500 MG tablet Take 500 mg by mouth every 6 (six) hours as needed for moderate pain.    Not Taking  . albuterol (PROVENTIL) (2.5 MG/3ML) 0.083% nebulizer solution Take 3 mLs (2.5 mg total) by nebulization every 6 (six) hours as needed for wheezing or shortness of breath. (Patient not taking: Reported on 08/18/2017) 75 mL 12 Not Taking  . cyclobenzaprine (FLEXERIL) 10 MG tablet Take 1 tablet (10 mg total) by mouth 3 (three) times daily as needed for muscle spasms. 30 tablet 1 Taking  . diphenhydrAMINE (BENADRYL) 25 MG tablet Take 25 mg by mouth every 6 (six) hours as needed for allergies or sleep.   Taking  . Doxylamine-Pyridoxine (DICLEGIS) 10-10 MG TBEC 2 tabs q hs, if sx persist add 1 tab q am on day 3, if sx persist add 1 tab q afternoon on day 4 (Patient not taking: Reported on 08/18/2017) 100 tablet 6 Not Taking  . ferrous sulfate 325 (65 FE) MG tablet Take 1 tablet (325 mg total) by mouth 2 (two) times daily with a meal. 60 tablet 3   . hydrocortisone (ANUSOL-HC) 2.5 % rectal cream Place 1 application rectally 2 (two) times daily. 30 g 0 Taking  . Olopatadine HCl (PATADAY) 0.2 % SOLN Apply 1 drop to eye daily as needed (for allergies).   Taking  . omeprazole (PRILOSEC) 20 MG capsule Take 1 capsule (20 mg total) by mouth daily. 30 capsule 3 Taking  . pantoprazole (PROTONIX) 20 MG tablet Take 1 tablet (20 mg total) by mouth daily. (Patient not taking: Reported on 08/05/2017) 30 tablet 6 Not Taking  . prenatal vitamin w/FE, FA (PRENATAL 1 + 1) 27-1 MG TABS tablet Take 1 tablet by mouth daily at 12 noon. (Patient not taking: Reported on  08/18/2017) 30 each 12 Not Taking  . ranitidine (ZANTAC) 75 MG tablet Take 1 tablet (75 mg total) by mouth 2 (two) times daily. (Patient not taking: Reported on 08/15/2017) 60 tablet 3 Not Taking  . sertraline (ZOLOFT) 25 MG tablet Take 1 tablet (25 mg total) by mouth daily. (Patient not taking: Reported on 08/05/2017) 30 tablet 6 Not Taking    I have reviewed patient's Past Medical Hx, Surgical Hx,  Family Hx, Social Hx, medications and allergies.   ROS:  Review of Systems  Constitutional: Negative for fever.  Gastrointestinal: Positive for abdominal pain.  Genitourinary: Negative for dysuria.  Musculoskeletal: Positive for back pain.  Neurological: Negative for headaches and paresthesias.   Other systems negative  Physical Exam   Patient Vitals for the past 24 hrs:  BP Temp Pulse Resp  08/25/17 0314 122/63 98.2 F (36.8 C) 94 20   Constitutional: Well-developed, well-nourished female in no acute distress.  Cardiovascular: normal rate and rhythm Respiratory: normal effort, clear to auscultation bilaterally GI: Abd soft, non-tender, gravid appropriate for gestational age.   No rebound or guarding. MS: Extremities nontender, no edema, normal ROM Neurologic: Alert and oriented x 4.  GU: Neg CVAT.  PELVIC EXAM: Dilation: Closed Effacement (%): Thick Cervical Position: Posterior Station: Ballotable Presentation: Undeterminable Exam by:: Kimm Ungaro CNM  FHT:  Baseline 140 , moderate variability, accelerations present, no decelerations Contractions:  Irregular, mild, irritability    Labs: --/--/A POS, A POS (05/05 1715) Results for orders placed or performed during the hospital encounter of 08/25/17 (from the past 24 hour(s))  Urinalysis, Routine w reflex microscopic     Status: Abnormal   Collection Time: 08/25/17  4:14 AM  Result Value Ref Range   Color, Urine YELLOW YELLOW   APPearance HAZY (A) CLEAR   Specific Gravity, Urine 1.009 1.005 - 1.030   pH 7.0 5.0 - 8.0    Glucose, UA NEGATIVE NEGATIVE mg/dL   Hgb urine dipstick NEGATIVE NEGATIVE   Bilirubin Urine NEGATIVE NEGATIVE   Ketones, ur NEGATIVE NEGATIVE mg/dL   Protein, ur NEGATIVE NEGATIVE mg/dL   Nitrite NEGATIVE NEGATIVE   Leukocytes, UA NEGATIVE NEGATIVE  Comprehensive metabolic panel     Status: Abnormal   Collection Time: 08/25/17  5:08 AM  Result Value Ref Range   Sodium 135 135 - 145 mmol/L   Potassium 3.7 3.5 - 5.1 mmol/L   Chloride 105 101 - 111 mmol/L   CO2 20 (L) 22 - 32 mmol/L   Glucose, Bld 108 (H) 65 - 99 mg/dL   BUN 6 6 - 20 mg/dL   Creatinine, Ser 0.41 (L) 0.44 - 1.00 mg/dL   Calcium 8.6 (L) 8.9 - 10.3 mg/dL   Total Protein 6.8 6.5 - 8.1 g/dL   Albumin 2.7 (L) 3.5 - 5.0 g/dL   AST 31 15 - 41 U/L   ALT 18 14 - 54 U/L   Alkaline Phosphatase 181 (H) 38 - 126 U/L   Total Bilirubin 0.5 0.3 - 1.2 mg/dL   GFR calc non Af Amer >60 >60 mL/min   GFR calc Af Amer >60 >60 mL/min   Anion gap 10 5 - 15    Imaging:  No results found.  MAU Course/MDM: I have ordered labs and reviewed results. UA negative, LFTs normal NST reviewed and is reactive.  Treatments in MAU included Terbutaline (given because BP was too low to give Procardia), IV hydration, GI cocktail (helped upper abdominal pain), Flexeril (helped back pain)  Contractions stopped with Terbutaline. Pt asked what to use at home so she won't have to come in, we discussed with neg FFn, don't want to Rx Procardia at home because we cannot know what her BP will tolerate.  Advised to call office if UCs become persistent. .    Assessment: Single IUP at [redacted]w[redacted]d Uterine irritability Thoracic back pain Epigastric pain, likely reflux Poor pain tolerance Reported history of elevated liver function tests (all in Epic are  normal)  Plan: Discharge home Preterm Labor precautions and fetal kick counts Flexeril for back spasm  Follow up in Office for prenatal visits and recheck of status  Encouraged to return here or to other Urgent  Care/ED if she develops worsening of symptoms, increase in pain, fever, or other concerning symptoms.   Pt stable at time of discharge.  Hansel Feinstein CNM, MSN Certified Nurse-Midwife 08/25/2017 3:58 AM

## 2017-09-11 ENCOUNTER — Emergency Department (HOSPITAL_COMMUNITY)
Admission: EM | Admit: 2017-09-11 | Discharge: 2017-09-11 | Disposition: A | Discharge status: Home / Self Care | Payer: Medicaid Other | Attending: Emergency Medicine | Admitting: Emergency Medicine

## 2017-09-11 ENCOUNTER — Encounter (HOSPITAL_COMMUNITY): Payer: Self-pay | Admitting: Emergency Medicine

## 2017-09-11 ENCOUNTER — Other Ambulatory Visit: Payer: Self-pay

## 2017-09-11 DIAGNOSIS — H9201 Otalgia, right ear: Secondary | ICD-10-CM | POA: Diagnosis present

## 2017-09-11 DIAGNOSIS — H6591 Unspecified nonsuppurative otitis media, right ear: Secondary | ICD-10-CM

## 2017-09-11 DIAGNOSIS — Z79899 Other long term (current) drug therapy: Secondary | ICD-10-CM | POA: Diagnosis not present

## 2017-09-11 DIAGNOSIS — Z87891 Personal history of nicotine dependence: Secondary | ICD-10-CM | POA: Diagnosis not present

## 2017-09-11 DIAGNOSIS — J45909 Unspecified asthma, uncomplicated: Secondary | ICD-10-CM | POA: Diagnosis not present

## 2017-09-11 DIAGNOSIS — H65191 Other acute nonsuppurative otitis media, right ear: Secondary | ICD-10-CM | POA: Insufficient documentation

## 2017-09-11 HISTORY — DX: Iron deficiency: E61.1

## 2017-09-11 MED ORDER — HYDROCODONE-ACETAMINOPHEN 5-325 MG PO TABS: 2.0000 | ORAL_TABLET | ORAL | 0 refills | Status: DC | PRN

## 2017-09-11 MED ORDER — AZITHROMYCIN 250 MG PO TABS
500.0000 mg | ORAL_TABLET | Freq: Once | ORAL | Status: AC
  Administered 2017-09-11: 500 mg via ORAL
  Filled 2017-09-11: qty 2

## 2017-09-11 MED ORDER — AZITHROMYCIN 250 MG PO TABS: 250.0000 mg | ORAL_TABLET | Freq: Every day | ORAL | 0 refills | Status: DC

## 2017-09-11 MED ORDER — HYDROCODONE-ACETAMINOPHEN 5-325 MG PO TABS
1.0000 | ORAL_TABLET | Freq: Once | ORAL | Status: AC
  Administered 2017-09-11: 1 via ORAL
  Filled 2017-09-11: qty 1

## 2017-09-11 NOTE — ED Triage Notes (Addendum)
Patient c/o sore throat and right ear pain/fullness. Per patient sore throat started on Tuesday and pain in ear started yesterday. Patient denies any fevers or drainage. Per patient extensive surgery in right ear. Patient states pain in throat better but now hoarse and pain in ear progressively getting worse. Patient reports taking tylenol with no relief-last dose at approx 1:30pm. Patient is [redacted] weeks pregnant, per patient fetal movement felt.

## 2017-09-11 NOTE — ED Provider Notes (Signed)
[facility] EMERGENCY DEPARTMENT Provider Note   CSN: [id number] Arrival date & time: 09/11/17  1709     History   Chief Complaint Chief Complaint  Patient presents with  . Sore Throat    HPI RECIE Stacey Cook is a 22 y.o. female.  Patient with a history of cholesteatoma and surgery to the right ear multiple times many years ago.  Patient with onset of right ear pain yesterday worse today.  Feels like a lot of pressure.  No drainage.  2 days prior to that did have a mild sore throat but no sore throat now.  Patient does have some allergy symptoms.  Denies any fevers.  Patient is [redacted] weeks pregnant no abdominal pain no vaginal bleeding no dysuria.  [facility].     Past Medical History:  Diagnosis Date  . Abdominal pain   . ADD (attention deficit disorder)   . Anginal pain ([employer])   . Anxiety   . Arthritis   . Asthma   . 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 Cervicalgia   . Chronic abdominal pain   . Constipation   . Depression   . Ear mass   . Gestational diabetes   . Gestational diabetes   . Headache   . HSV infection   . Low iron   . Suicidal intent   . UTI (lower urinary tract infection) 05/2014  . Vomiting     Patient Active Problem List   Diagnosis Date Noted  . Gestational diabetes mellitus, class A1 08/24/2017  . Chest pain due to GERD 08/05/2017  . Depression with anxiety 06/13/2017  . Supervision of normal pregnancy 04/28/2017  . History of miscarriage 01/18/2017  . Conductive hearing loss, unilat, unrestrict hearing contralateral side 12/10/2015  . Severe episode of recurrent major depressive disorder, without psychotic features ([place])   . High-risk sexual behavior   . 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 Asthma, mild intermittent 02/18/2015  . 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  Perforation of left tympanic membrane 06/19/2014  . Cervicalgia 12/20/2013  . Abdominal pain, chronic, epigastric 08/08/2013  . Acne 08/22/2012  . Syncope 08/16/2012  . GAD (generalized anxiety disorder) 08/01/2012  . ADHD (attention deficit hyperactivity disorder), combined type 08/01/2012  . ODD (oppositional defiant disorder) 08/01/2012  . Vasovagal syncope 02/28/2012  . Cholesteatoma of attic of right ear 02/15/2011    Past Surgical History:  Procedure Laterality Date  . CHOLESTEATOMA EXCISION    . DILATION AND EVACUATION N/A 09/11/2016   Procedure: DILATATION AND CURRATAGE  2ND TRIMESTER;  Surgeon: Jonnie Kind, MD;  Location: [facility];  Service: Gynecology;  Laterality: N/A;  . IMPLANTATION BONE ANCHORED HEARING AID Right 04/2013  . 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 TYMPANOPLASTY Left   . TYMPANOSTOMY       OB History    Gravida  2   Para  0   Term  0   Preterm  0   AB  1   Living  0     SAB  1   TAB  0   Ectopic  0   Multiple  0   Live Births  0            Home Medications    Prior to Admission medications   Medication Sig Start  Date End Date Taking? Authorizing Provider  acetaminophen (TYLENOL) 500 MG tablet Take 500 mg by mouth every 6 (six) hours as needed for moderate pain.    Yes [provider]  cyclobenzaprine (FLEXERIL) 10 MG tablet Take 1 tablet (10 mg total) by mouth 3 (three) times daily as needed for muscle spasms. 08/15/17  Yes Jonnie Kind, MD  diphenhydrAMINE (BENADRYL) 25 MG tablet Take 25 mg by mouth every 6 (six) hours as needed for allergies or sleep.   Yes [provider]  ferrous sulfate 325 (65 FE) MG tablet Take 1 tablet (325 mg total) by mouth 2 (two) times daily with a meal. 08/24/17  Yes Booker, Royetta Crochet, CNM  Olopatadine HCl (PATADAY) 0.2 % SOLN Apply 1 drop to eye daily as needed (for allergies).   Yes [provider]  omeprazole (PRILOSEC) 20 MG capsule Take 1  capsule (20 mg total) by mouth daily. 08/05/17  Yes Jonnie Kind, MD  ranitidine (ZANTAC) 75 MG tablet Take 1 tablet (75 mg total) by mouth 2 (two) times daily. 06/23/17  Yes Roma Schanz, CNM  albuterol (PROVENTIL) (2.5 MG/3ML) 0.083% nebulizer solution Take 3 mLs (2.5 mg total) by nebulization every 6 (six) hours as needed for wheezing or shortness of breath. Patient not taking: Reported on 08/18/2017 09/05/16   Virgel Manifold, MD  azithromycin (ZITHROMAX) 250 MG tablet Take 1 tablet (250 mg total) by mouth daily. Take first 2 tablets together, then 1 every day until finished. 09/11/17   Fredia Sorrow, MD  Doxylamine-Pyridoxine (DICLEGIS) 10-10 MG TBEC 2 tabs q hs, if sx persist add 1 tab q am on day 3, if sx persist add 1 tab q afternoon on day 4 Patient not taking: Reported on 08/18/2017 06/23/17   Roma Schanz, CNM  HYDROcodone-acetaminophen (NORCO/VICODIN) 5-325 MG tablet Take 2 tablets by mouth every 4 (four) hours as needed. 09/11/17   Fredia Sorrow, MD    Family History Family History  Problem Relation Age of Onset  . Cholelithiasis Mother   . Kidney disease Mother        stones  . Depression Mother   . Hypertension Maternal Grandmother   . Diabetes Maternal Grandmother   . Stroke Maternal Grandmother   . Seizures Maternal Grandmother   . Asthma Maternal Grandmother   . Hyperlipidemia Maternal Grandmother   . Thyroid disease Maternal Grandmother   . Asthma Brother   . Seizures Maternal Uncle   . Cancer Other        breast- great aunt  . Celiac disease Neg Hx     Social History Social History   Tobacco Use  . Smoking status: Former Smoker    Packs/day: 0.00    Years: 1.00    Pack years: 0.00    Types: Cigarettes  . Smokeless tobacco: Never Used  . Tobacco comment: smokes 10 cig daily  Substance Use Topics  . Alcohol use: No    Alcohol/week: 0.0 oz  . Drug use: No     Allergies   Keflex [cephalexin]; Other; and Adhesive [tape]   Review of  Systems Review of Systems  Constitutional: Negative for fever.  HENT: Positive for congestion, ear pain and sore throat. Negative for sinus pain.   Eyes: Negative for visual disturbance.  Respiratory: Negative for shortness of breath.   Cardiovascular: Negative for chest pain.  Gastrointestinal: Negative for abdominal pain, nausea and vomiting.  Genitourinary: Negative for dysuria, pelvic pain and vaginal bleeding.  Musculoskeletal: Negative for back  pain.  Skin: Negative for rash.  Neurological: Negative for syncope.  Hematological: Does not bruise/bleed easily.  Psychiatric/Behavioral: Negative for confusion.     Physical Exam Updated Vital Signs BP 139/77 (BP Location: Right Arm)   Pulse (!) 118   Temp 98.6 F (37 C) (Oral)   Resp 18   Ht 1.499 m (4\' 11" )   Wt 85.9 kg (189 lb 4.8 oz)   LMP 02/10/2017   SpO2 99%   BMI 38.23 kg/m   Physical Exam  Constitutional: She is oriented to person, place, and time. She appears well-developed and well-nourished. No distress.  HENT:  Head: Normocephalic and atraumatic.  Left Ear: External ear normal.  Mouth/Throat: Oropharynx is clear and moist. No oropharyngeal exudate.  Right ear canal without significant swelling.  Some bulging of the tympanic membrane there is some scarring slight erythema.  Patient had multiple ear surgeries in the past on that side.  Bilateral tonsils enlarged no exudate uvula midline no soft palate swelling no erythema.  No redness around the right ear.  Eyes: Pupils are equal, round, and reactive to light. Conjunctivae and EOM are normal.  Neck: Neck supple.  Cardiovascular: Normal rate, regular rhythm and normal heart sounds.  Pulmonary/Chest: Effort normal and breath sounds normal.  Abdominal: Soft. Bowel sounds are normal. She exhibits distension. There is no tenderness.  Gravid  Musculoskeletal: Normal range of motion.  Lymphadenopathy:    She has no cervical adenopathy.  Neurological: She is alert and  oriented to person, place, and time. No cranial nerve deficit or sensory deficit. She exhibits normal muscle tone. Coordination normal.  Skin: Skin is warm. No rash noted. No erythema.  Nursing note and vitals reviewed.    ED Treatments / Results  Labs (all labs ordered are listed, but only abnormal results are displayed) Labs Reviewed - No data to display  EKG None  Radiology No results found.  Procedures Procedures (including critical care time)  Medications Ordered in ED Medications  azithromycin (ZITHROMAX) tablet 500 mg (has no administration in time range)  HYDROcodone-acetaminophen (NORCO/VICODIN) 5-325 MG per tablet 1 tablet (1 tablet Oral Given 09/11/17 1833)     Initial Impression / Assessment and Plan / ED Course  I have reviewed the triage vital signs and the nursing notes.  Pertinent labs & imaging results that were available during my care of the patient were reviewed by me and considered in my medical decision making (see chart for details).      Patient is [redacted] weeks pregnant followed by [facility].  No complicating factors with the pregnancy to date.  Her fetal heart tones here are 151.  Patient without any abdominal pain or vaginal bleeding.  Patient's main complaint was right ear pain.  Patient had a cholesteatoma in the past and has had multiple ear surgeries in that ear and is followed by ear nose throat at [facility].  Pain started yesterday worse today feels like there is a lot of pressure in the ear.  Patient has not really had an upper respiratory infection does have some allergy congestion.  Few days ago had a sore throat that was mild and really has no sore throat at all now.  No fevers.  Patient nontoxic no acute distress.  Left ear normal right ear difficult to decipher does appears that the TM is pushing outward.  No significant erythema but some slight erythema also ear canal does not seem to be inflamed.  Oropharynx with enlarged tonsils  bilaterally no  exudate.  Patient is allergic to Keflex patient will be treated with Zithromax for possible otitis could just be ear pressure.  Treated with hydrocodone for pain.  Review shows that Zithromax is safe during pregnancy.  Patient will follow up with her ear nose and throat and will contact her OB/GYN about the medications.  Patient given a prepack of the hydrocodone since "Sunday given first dose of Zithromax here.  Due to her history of the cholesteatoma patient may require CT scan.  No significant tender over the mastoid sinus area.  Patient's blood pressure without significant elevation.    Final Clinical Impressions(s) / ED Diagnoses   Final diagnoses:  Other nonsuppurative otitis media of right ear, unspecified chronicity    ED Discharge Orders        Ordered    azithromycin (ZITHROMAX) 250 MG tablet  Daily     09/11/17 2005    HYDROcodone-acetaminophen (NORCO/VICODIN) 5-325 MG tablet  Every 4 hours PRN     05" /05/19 Shawnie Dapper, MD 09/11/17 2027

## 2017-09-11 NOTE — Discharge Instructions (Addendum)
Follow-up with your ear nose and throat doctor at [facility] regarding ear pain.  Take the antibiotic as directed.  This antibiotic should be safe during your pregnancy.  Contact your OB/GYN to let him know about the medication.  Take the hydrocodone as needed for pain.  As we discussed it is possible you may have a right middle ear infection or could just be pressure.  This is why the follow-up with your ear nose and throat doctor will be important.  Return for any new or worse symptoms.

## 2017-09-12 MED FILL — Hydrocodone-Acetaminophen Tab 5-325 MG: ORAL | Qty: 6 | Status: AC

## 2017-09-14 ENCOUNTER — Ambulatory Visit: Payer: Medicaid Other

## 2017-09-15 ENCOUNTER — Encounter: Payer: Self-pay | Admitting: Women's Health

## 2017-09-15 ENCOUNTER — Ambulatory Visit (INDEPENDENT_AMBULATORY_CARE_PROVIDER_SITE_OTHER): Payer: Medicaid Other | Admitting: Women's Health

## 2017-09-15 VITALS — BP 100/80 | HR 125 | Wt 187.0 lb

## 2017-09-15 DIAGNOSIS — O0993 Supervision of high risk pregnancy, unspecified, third trimester: Secondary | ICD-10-CM

## 2017-09-15 DIAGNOSIS — L299 Pruritus, unspecified: Secondary | ICD-10-CM

## 2017-09-15 DIAGNOSIS — Z331 Pregnant state, incidental: Secondary | ICD-10-CM

## 2017-09-15 DIAGNOSIS — Z1389 Encounter for screening for other disorder: Secondary | ICD-10-CM

## 2017-09-15 DIAGNOSIS — O24410 Gestational diabetes mellitus in pregnancy, diet controlled: Secondary | ICD-10-CM

## 2017-09-15 DIAGNOSIS — O9989 Other specified diseases and conditions complicating pregnancy, childbirth and the puerperium: Secondary | ICD-10-CM

## 2017-09-15 DIAGNOSIS — Z3A31 31 weeks gestation of pregnancy: Secondary | ICD-10-CM

## 2017-09-15 LAB — POCT URINALYSIS DIPSTICK
Glucose, UA: NEGATIVE
KETONES UA: NEGATIVE
NITRITE UA: NEGATIVE
RBC UA: NEGATIVE

## 2017-09-15 NOTE — Progress Notes (Signed)
HIGH-RISK PREGNANCY VISIT Patient name: Stacey Cook MRN [id number]  Date of birth: 01-30-1996 Chief Complaint:   High Risk Gestation  History of Present Illness:   Stacey Cook is a 22 y.o. G61P0010 female at [redacted]w[redacted]d with an Estimated Date of Delivery: 11/17/17 being seen today for ongoing management of a high-risk pregnancy complicated by Y8MV- was unable to go to dietician yesterday d/t transportation issues- needs appt in [place].  Today she reports itching all over for about 1 week, thinks she's allergic to cats she has been around. Benadryl doesn't help. 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 Contractions: Irregular. Vag. Bleeding: None.  Movement: Present. denies leaking of fluid.  Review of Systems:   Pertinent items are noted in HPI Denies abnormal vaginal discharge w/ itching/odor/irritation, headaches, visual changes, shortness of breath, chest pain, abdominal pain, severe nausea/vomiting, or problems with urination or bowel movements unless otherwise stated above. Pertinent History Reviewed:  Reviewed past medical,surgical, social, obstetrical and family history.  Reviewed problem list, medications and allergies. Physical Assessment:   Vitals:   09/15/17 1535  BP: 100/80  Pulse: (!) 125  Weight: 187 lb (84.8 kg)  Body mass index is 37.77 kg/m.           Physical Examination:   General appearance: alert, well appearing, and in no distress  Mental status: alert, oriented to person, place, and time  Skin: warm & dry   Extremities: Edema: Trace    Cardiovascular: normal heart rate noted  Respiratory: normal respiratory effort, no distress  Abdomen: gravid, soft, non-tender  Pelvic: Cervical exam deferred         Fetal Status: Fetal Heart Rate (bpm): 160 Fundal Height: 32 cm Movement: Present    Fetal Surveillance Testing today: doppler   Results for orders placed or performed in visit on 09/15/17 (from the past 24  hour(s))  POCT urinalysis dipstick   Collection Time: 09/15/17  3:37 PM  Result Value Ref Range   Color, UA     Clarity, UA     Glucose, UA neg    Bilirubin, UA     Ketones, UA neg    Spec Grav, UA  1.010 - 1.025   Blood, UA neg    pH, UA  5.0 - 8.0   Protein, UA trace    Urobilinogen, UA  0.2 or 1.0 E.U./dL   Nitrite, UA neg    Leukocytes, UA Trace (A) Negative   Appearance     Odor      Assessment & Plan:  1) High-risk pregnancy G2P0010 at [redacted]w[redacted]d with an Estimated Date of Delivery: 11/17/17   2) A1DM, hasn't been to dietician/started checking sugars d/t transportation issues, message sent to Tish to get appt switched to [place]/call pt w/ date/time. Glucometer/strips/lancets called to [facility] for QID testing w/ prn refills. Pick up today and start checking sugars QID, discussed and gave printed info on times/goals. Discussed importance of strict glycemic control and adherence to low carb diet during pregnancy as well as potential complications from uncontrolled diabetes during pregnancy.  3) Generalized pruritus, will check fasting bile acids, CMP, states she can come Sat am. Discussed and gave printed relief info.   Meds: No orders of the defined types were placed in this encounter.   Labs/procedures today: doppler  Treatment Plan:  Growth u/s @ 36-38wks     Deliver @ 40wks  Reviewed: Preterm labor symptoms and general obstetric  precautions including but not limited to vaginal bleeding, contractions, leaking of fluid and fetal movement were reviewed in detail with the patient.  All questions were answered.  Follow-up: Return in about 2 weeks (around 09/29/2017) for [place].  Orders Placed This Encounter  Procedures  . Comprehensive metabolic panel  . Bile acids, total  . POCT urinalysis dipstick   Roma Schanz CNM, [facility] 09/15/2017 4:08 PM

## 2017-09-15 NOTE — Patient Instructions (Addendum)
Stacey Cook, I greatly value your feedback.  If you receive a survey following your visit with [name] today, we appreciate you taking the time to fill it out.  Thanks, [name], CNM, WHNP-BC  Do not eat or drink anything after midnight Friday night, come to lab between 8am-12pm Saturday for bloodwork  For itching:  . Avoid hot showers/baths, take cool/luke-warm showers/baths instead . Cool washcloths to itchy areas . Aquaphor or Eucerin creams to moisturize the skin . Hydrocortisone or Benadryl cream, or Benadryl by mouth for the itching . Oatmeal baths  Check blood sugars 4 times a day: in the morning before eating/drinking anything (<95) and 2 hours after eating breakfast, lunch, and supper (<120).       Call the office [phone]) or go to [facility] if:  You begin to have strong, frequent contractions  Your water breaks.  Sometimes it is a big gush of fluid, sometimes it is just a trickle that keeps getting your panties wet or running down your legs  You have vaginal bleeding.  It is normal to have a small amount of spotting if your cervix was checked.   You don't feel your baby moving like normal.  If you don't, get you something to eat and drink and lay down and focus on feeling your baby move.  You should feel at least 10 movements in 2 hours.  If you don't, you should call the office or go to [facility].    Tdap Vaccine  It is recommended that you get the Tdap vaccine during the third trimester of EACH pregnancy to help protect your baby from getting pertussis (whooping cough)  27-36 weeks is the BEST time to do this so that you can pass the protection on to your baby. During pregnancy is better than after pregnancy, but if you are unable to get it during pregnancy it will be offered at the hospital.   You can get this vaccine at the health department or your family doctor  Everyone who will be around your baby should also be up-to-date on their vaccines. Adults  (who are not pregnant) only need 1 dose of Tdap during adulthood.   Third Trimester of Pregnancy The third trimester is from week 29 through week 42, months 7 through 9. The third trimester is a time when the fetus is growing rapidly. At the end of the ninth month, the fetus is about 20 inches in length and weighs 6-10 pounds.  BODY CHANGES Your body goes through many changes during pregnancy. The changes vary from woman to woman.   Your weight will continue to increase. You can expect to gain 25-35 pounds (11-16 kg) by the end of the pregnancy.  You may begin to get stretch marks on your hips, abdomen, and breasts.  You may urinate more often because the fetus is moving lower into your pelvis and pressing on your bladder.  You may develop or continue to have heartburn as a result of your pregnancy.  You may develop constipation because certain hormones are causing the muscles that push waste through your intestines to slow down.  You may develop hemorrhoids or swollen, bulging veins (varicose veins).  You may have pelvic pain because of the weight gain and pregnancy hormones relaxing your joints between the bones in your pelvis. Backaches may result from overexertion of the muscles supporting your posture.  You may have changes in your hair. These can include thickening of your hair, rapid growth, and changes  in texture. Some women also have hair loss during or after pregnancy, or hair that feels dry or thin. Your hair will most likely return to normal after your baby is born.  Your breasts will continue to grow and be tender. A yellow discharge may leak from your breasts called colostrum.  Your belly button may stick out.  You may feel short of breath because of your expanding uterus.  You may notice the fetus "dropping," or moving lower in your abdomen.  You may have a bloody mucus discharge. This usually occurs a few days to a week before labor begins.  Your cervix becomes thin  and soft (effaced) near your due date. WHAT TO EXPECT AT YOUR PRENATAL EXAMS  You will have prenatal exams every 2 weeks until week 36. Then, you will have weekly prenatal exams. During a routine prenatal visit:  You will be weighed to make sure you and the fetus are growing normally.  Your blood pressure is taken.  Your abdomen will be measured to track your baby's growth.  The fetal heartbeat will be listened to.  Any test results from the previous visit will be discussed.  You may have a cervical check near your due date to see if you have effaced. At around 36 weeks, your caregiver will check your cervix. At the same time, your caregiver will also perform a test on the secretions of the vaginal tissue. This test is to determine if a type of bacteria, Group B streptococcus, is present. Your caregiver will explain this further. Your caregiver may ask you:  What your birth plan is.  How you are feeling.  If you are feeling the baby move.  If you have had any abnormal symptoms, such as leaking fluid, bleeding, severe headaches, or abdominal cramping.  If you have any questions. Other tests or screenings that may be performed during your third trimester include:  Blood tests that check for low iron levels (anemia).  Fetal testing to check the health, activity level, and growth of the fetus. Testing is done if you have certain medical conditions or if there are problems during the pregnancy. FALSE LABOR You may feel small, irregular contractions that eventually go away. These are called Braxton Hicks contractions, or false labor. Contractions may last for hours, days, or even weeks before true labor sets in. If contractions come at regular intervals, intensify, or become painful, it is best to be seen by your caregiver.  SIGNS OF LABOR   Menstrual-like cramps.  Contractions that are 5 minutes apart or less.  Contractions that start on the top of the uterus and spread down to the  lower abdomen and back.  A sense of increased pelvic pressure or back pain.  A watery or bloody mucus discharge that comes from the vagina. If you have any of these signs before the 37th week of pregnancy, call your caregiver right away. You need to go to the hospital to get checked immediately. HOME CARE INSTRUCTIONS   Avoid all smoking, herbs, alcohol, and unprescribed drugs. These chemicals affect the formation and growth of the baby.  Follow your caregiver's instructions regarding medicine use. There are medicines that are either safe or unsafe to take during pregnancy.  Exercise only as directed by your caregiver. Experiencing uterine cramps is a good sign to stop exercising.  Continue to eat regular, healthy meals.  Wear a good support bra for breast tenderness.  Do not use hot tubs, steam rooms, or saunas.  Wear your  seat belt at all times when driving.  Avoid raw meat, uncooked cheese, cat litter boxes, and soil used by cats. These carry germs that can cause birth defects in the baby.  Take your prenatal vitamins.  Try taking a stool softener (if your caregiver approves) if you develop constipation. Eat more high-fiber foods, such as fresh vegetables or fruit and whole grains. Drink plenty of fluids to keep your urine clear or pale yellow.  Take warm sitz baths to soothe any pain or discomfort caused by hemorrhoids. Use hemorrhoid cream if your caregiver approves.  If you develop varicose veins, wear support hose. Elevate your feet for 15 minutes, 3-4 times a day. Limit salt in your diet.  Avoid heavy lifting, wear low heal shoes, and practice good posture.  Rest a lot with your legs elevated if you have leg cramps or low back pain.  Visit your dentist if you have not gone during your pregnancy. Use a soft toothbrush to brush your teeth and be gentle when you floss.  A sexual relationship may be continued unless your caregiver directs you otherwise.  Do not travel far  distances unless it is absolutely necessary and only with the approval of your caregiver.  Take prenatal classes to understand, practice, and ask questions about the labor and delivery.  Make a trial run to the hospital.  Pack your hospital bag.  Prepare the baby's nursery.  Continue to go to all your prenatal visits as directed by your caregiver. SEEK MEDICAL CARE IF:  You are unsure if you are in labor or if your water has broken.  You have dizziness.  You have mild pelvic cramps, pelvic pressure, or nagging pain in your abdominal area.  You have persistent nausea, vomiting, or diarrhea.  You have a bad smelling vaginal discharge.  You have pain with urination. SEEK IMMEDIATE MEDICAL CARE IF:   You have a fever.  You are leaking fluid from your vagina.  You have spotting or bleeding from your vagina.  You have severe abdominal cramping or pain.  You have rapid weight loss or gain.  You have shortness of breath with chest pain.  You notice sudden or extreme swelling of your face, hands, ankles, feet, or legs.  You have not felt your baby move in over an hour.  You have severe headaches that do not go away with medicine.  You have vision changes. Document Released: 04/20/2001 Document Revised: 05/01/2013 Document Reviewed: 06/27/2012 [facility] Patient Information 2015 [place], [place]. This information is not intended to replace advice given to you by your health care provider. Make sure you discuss any questions you have with your health care provider.   Gestational Diabetes Mellitus, Diagnosis Gestational diabetes (gestational diabetes mellitus) is a short-term (temporary) form of diabetes that can happen during pregnancy. It goes away after you give birth. It may be caused by one or both of these problems:  Your body does not make enough of a hormone called insulin.  Your body does not respond in a normal way to insulin that it makes.  Insulin lets sugars  (glucose) go into cells in the body. This gives you energy. If you have diabetes, sugars cannot get into cells. This causes high blood sugar (hyperglycemia). If diabetes is treated, it may not hurt you or your baby. Your doctor will set treatment goals for you. In general, you should have these blood sugar levels:  After not eating for a long time (fasting): 95 mg/dL (5.3 mmol/L).  After  meals (postprandial): ? One hour after a meal: at or below 140 mg/dL (7.8 mmol/L). ? Two hours after a meal: at or below 120 mg/dL (6.7 mmol/L).  A1c (hemoglobin A1c) level: 6-6.5%.  Follow these instructions at home: Questions to Ask Your Doctor  You may want to ask these questions:  Do I need to meet with a diabetes educator?  Where can I find a support group for people with diabetes?  What equipment will I need to care for myself at home?  What diabetes medicines do I need? When should I take them?  How often do I need to check my blood sugar?  What number can I call if I have questions?  When is my next doctor's visit?  General instructions  Take over-the-counter and prescription medicines only as told by your doctor.  Stay at a healthy weight during pregnancy.  Keep all follow-up visits as told by your doctor. This is important. Contact a doctor if:  Your blood sugar is at or above 240 mg/dL (13.3 mmol/L).  Your blood sugar is at or above 200 mg/dL (11.1 mmol/L) and you have ketones in your pee (urine).  You have been sick or have had a fever for 2 days or more and you are not getting better.  You have any of these problems for more than 6 hours: ? You cannot eat or drink. ? You feel sick to your stomach (nauseous). ? You throw up (vomit). ? You have watery poop (diarrhea). Get help right away if:  Your blood sugar is lower than 54 mg/dL (3 mmol/L).  You get confused.  You have trouble: ? Thinking clearly. ? Breathing.  Your baby moves less than normal.  You  have: ? Moderate or large ketone levels in your pee (urine). ? Bleeding from your vagina. ? Unusual fluid coming from your vagina. ? Early contractions. These may feel like tightness in your belly. This information is not intended to replace advice given to you by your health care provider. Make sure you discuss any questions you have with your health care provider. Document Released: 08/18/2015 Document Revised: 10/02/2015 Document Reviewed: 05/30/2015 Elsevier Interactive Patient Education  [employer].

## 2017-09-19 LAB — COMPREHENSIVE METABOLIC PANEL
ALBUMIN: 3.5 g/dL (ref 3.5–5.5)
ALK PHOS: 276 IU/L — AB (ref 39–117)
ALT: 51 IU/L — ABNORMAL HIGH (ref 0–32)
AST: 43 IU/L — ABNORMAL HIGH (ref 0–40)
Albumin/Globulin Ratio: 1.2 (ref 1.2–2.2)
BUN / CREAT RATIO: 13 (ref 9–23)
BUN: 6 mg/dL (ref 6–20)
Bilirubin Total: 0.3 mg/dL (ref 0.0–1.2)
CO2: 19 mmol/L — AB (ref 20–29)
CREATININE: 0.45 mg/dL — AB (ref 0.57–1.00)
Calcium: 9.2 mg/dL (ref 8.7–10.2)
Chloride: 102 mmol/L (ref 96–106)
GFR calc non Af Amer: 146 mL/min/{1.73_m2} (ref >59)
GFR, EST AFRICAN AMERICAN: 168 mL/min/{1.73_m2} (ref >59)
GLOBULIN, TOTAL: 3 g/dL (ref 1.5–4.5)
GLUCOSE: 85 mg/dL (ref 65–99)
Potassium: 4.2 mmol/L (ref 3.5–5.2)
SODIUM: 135 mmol/L (ref 134–144)
Total Protein: 6.5 g/dL (ref 6.0–8.5)

## 2017-09-19 LAB — BILE ACIDS, TOTAL: BILE ACIDS TOTAL: 15.7 umol/L (ref 4.7–24.5)

## 2017-09-21 ENCOUNTER — Telehealth: Payer: Self-pay | Admitting: *Deleted

## 2017-09-21 ENCOUNTER — Telehealth: Payer: Self-pay | Admitting: Women's Health

## 2017-09-21 DIAGNOSIS — Z8719 Personal history of other diseases of the digestive system: Secondary | ICD-10-CM | POA: Insufficient documentation

## 2017-09-21 DIAGNOSIS — Z8759 Personal history of other complications of pregnancy, childbirth and the puerperium: Secondary | ICD-10-CM

## 2017-09-21 MED ORDER — URSODIOL 300 MG PO CAPS: 300.0000 mg | ORAL_CAPSULE | Freq: Three times a day (TID) | ORAL | 3 refills | Status: DC

## 2017-09-21 NOTE — Telephone Encounter (Signed)
Pharmacy to change Actigal capsule to tablet in order for MCD to cover. Pharmacy to notify patient.

## 2017-09-21 NOTE — Telephone Encounter (Signed)
Appt for dietician on 5/21 @ 1:30. Pt to take meter and readings to appt.  Pt notified, address and directions given to patient.

## 2017-09-21 NOTE — Telephone Encounter (Signed)
Called pt, notified of dx of ICP, rx actigall 300mg  TID, scheduled for this Friday 5/17 @ 10:30am for HROB/NST.  Roma Schanz, CNM, [facility] 09/21/2017 1:56 PM

## 2017-09-23 ENCOUNTER — Other Ambulatory Visit: Payer: Medicaid Other | Admitting: Obstetrics & Gynecology

## 2017-09-26 ENCOUNTER — Encounter: Payer: Self-pay | Admitting: Obstetrics and Gynecology

## 2017-09-26 ENCOUNTER — Ambulatory Visit (INDEPENDENT_AMBULATORY_CARE_PROVIDER_SITE_OTHER): Payer: Medicaid Other | Admitting: Obstetrics and Gynecology

## 2017-09-26 ENCOUNTER — Other Ambulatory Visit: Payer: Medicaid Other | Admitting: Obstetrics and Gynecology

## 2017-09-26 VITALS — BP 120/78 | HR 109 | Wt 195.0 lb

## 2017-09-26 DIAGNOSIS — O0993 Supervision of high risk pregnancy, unspecified, third trimester: Secondary | ICD-10-CM

## 2017-09-26 DIAGNOSIS — Z331 Pregnant state, incidental: Secondary | ICD-10-CM

## 2017-09-26 DIAGNOSIS — Z1389 Encounter for screening for other disorder: Secondary | ICD-10-CM

## 2017-09-26 DIAGNOSIS — O24410 Gestational diabetes mellitus in pregnancy, diet controlled: Secondary | ICD-10-CM | POA: Diagnosis not present

## 2017-09-26 DIAGNOSIS — Z3483 Encounter for supervision of other normal pregnancy, third trimester: Secondary | ICD-10-CM

## 2017-09-26 DIAGNOSIS — Z3A32 32 weeks gestation of pregnancy: Secondary | ICD-10-CM

## 2017-09-26 LAB — POCT URINALYSIS DIPSTICK
GLUCOSE UA: NEGATIVE
Ketones, UA: NEGATIVE
Nitrite, UA: NEGATIVE
PROTEIN UA: NEGATIVE
RBC UA: NEGATIVE

## 2017-09-26 NOTE — Patient Instructions (Signed)
Cholestasis of Pregnancy Cholestasis refers to any condition that causes the flow of digestive fluid (bile) produced by the liver to slow down or stop. Cholestasis of pregnancy is most common toward the end of pregnancy (thirdtrimester), but it can occur any time during pregnancy. The condition often goes away soon after giving birth. Cholestasis may be uncomfortable, but it is usually harmless to you. However, it can be harmful to your baby. Cholestasis may increase the risk of:  Your baby being born too early (preterm delivery).  Your baby having a slow heart rate and lack of oxygen during delivery (fetal distress).  Losing your baby before delivery (stillbirth).  What are the causes? The exact cause of this condition is not known, but it may be related to:  Pregnancy hormones. The gallbladder normally holds bile until you need it to help digest fat in your diet. Pregnancy hormones may cause the flow of bile to slow down and back up into your liver. Bile may then get into your bloodstream and cause cholestasis symptoms.  Changes in your genes (genetic mutations). Specifically, genes that affect how the liver releases bile.  What increases the risk? You are more likely to develop this condition if:  You had cholestasis during a previous pregnancy.  You have a family history of cholestasis.  You have liver problems.  You are having multiple babies, such as twins or triplets.  What are the signs or symptoms? The most common symptom of this condition is intense itching (pruritus), especially on the palms of your hands and soles of your feet. The itching can spread to the rest of your body and is often worse at night. You will not usually have a rash. Other symptoms may include:  Feeling tired.  Pain in your upper right abdomen.  Dark-colored urine.  Light-colored stools.  Poor appetite.  Yellowish discoloration of your skin and the whites of your eyes (jaundice).  How is this  diagnosed? This condition is diagnosed based on:  Your medical history.  A physical exam.  Blood tests.  If you have an inherited risk for developing this condition, you may also have genetic testing. How is this treated? The goal of treatment is to make you comfortable and keep your baby safe. Your health care provider may:  Prescribe medicine to reduce bile acid in your bloodstream, relieve symptoms, and help keep your baby safe.  Give you vitamin K before delivery to prevent excessive bleeding.  Check your baby frequently (fetal monitoring).  Perform regular blood tests to check your bile levels and liver function until your baby is delivered.  Recommend starting (inducing) your labor and delivery by week 36 or 37 of pregnancy, or as soon as your baby's lungs have developed enough.  Follow these instructions at home:  Take over-the-counter and prescription medicines only as told by your health care provider.  Take cool baths to soothe itchy skin.  Wear comfortable, loose-fitting, cotton clothing to reduce itching.  Keep your fingernails short to prevent skin irritation from scratching.  Keep all follow-up visits and prenatal visits as told by your health care provider. This is important. Contact a health care provider if:  Your symptoms get worse, even with treatment.  You develop pain in your right side.  You have unusual swelling in your abdomen, feet, ankles, or legs.  You have a fever.  You are more thirsty than usual. Get help right away if:  You go into early labor.  You have a headache that  does not go away or causes changes in vision.  You have nausea or you vomit.  You have severe pain in your abdomen or shoulders.  You have shortness of breath. Summary  Cholestasis of pregnancy is most common toward the end of pregnancy (thirdtrimester), but it can occur any time during your pregnancy.  The condition often goes away soon after your baby is  born.  The most common symptom of cholestasis of pregnancy is intense itching (pruritus), especially on the palms of your hands and soles of your feet.  This condition may be treated with medicine, frequent monitoring, or starting (inducing) labor and delivery by week 36 or 37 of pregnancy. This information is not intended to replace advice given to you by your health care provider. Make sure you discuss any questions you have with your health care provider. Document Released: 04/23/2000 Document Revised: 04/10/2016 Document Reviewed: 04/10/2016 Elsevier Interactive Patient Education  2017 [employer].

## 2017-09-26 NOTE — Progress Notes (Signed)
Patient ID: CABELA PACIFICO, female   DOB: 1995-12-24, 22 y.o.   MRN: [id number]   [facility] PREGNANCY VISIT Patient name: Stacey Cook MRN [id number]  Date of birth: 11-21-1995 Chief Complaint:   High Risk Gestation (NST; pain in low stomach and sides; nausea; lightheaded; fell Friday night )  History of Present Illness:   Stacey Cook is a 22 y.o. G3P0010 female at [redacted]w[redacted]d with an Estimated Date of Delivery: 11/17/17 being seen today for ongoing management of a high-risk pregnancy complicated by Q7HA, and now ICP with Bile acids 15, on Ursodiol 300 mg tid. Goes to the dietician tomorrow, 09/27/2017 Today she reports tightness and pain in her pelvis. She has been monitoring her blood sugars. The highest was 155 after eating lunch. She was relaxing all day. The lowest it has been was 88. She denies dysuria.  Contractions: Irregular. Vag. Bleeding: None.  Movement: (!) Decreased. denies leaking of fluid.  Review of Systems:   Pertinent items are noted in HPI Denies abnormal vaginal discharge w/ itching/odor/irritation, headaches, visual changes, shortness of breath, chest pain, abdominal pain, severe nausea/vomiting, or problems with urination or bowel movements unless otherwise stated above. Pertinent History Reviewed:  Reviewed past medical,surgical, social, obstetrical and family history.  Reviewed problem list, medications and allergies. Physical Assessment:   Vitals:   09/26/17 0944  BP: 120/78  Pulse: (!) 109  Weight: 195 lb (88.5 kg)  Body mass index is 39.39 kg/m.           Physical Examination:   General appearance: alert, well appearing, and in no distress, oriented to person, place, and time and overweight  Mental status: alert, oriented to person, place, and time, normal mood, behavior, speech, dress, motor activity, and thought processes, affect appropriate to mood  Skin: warm & dry   Extremities: Edema: Trace    Cardiovascular: normal heart rate noted  Respiratory: normal  respiratory effort, no distress  Abdomen: gravid, soft, non-tender  Pelvic: Cervical exam deferred         Fetal Status: Fetal Heart Rate (bpm): 136 NST Fundal Height: 33 cm Movement: (!) Decreased    Fetal Surveillance Testing today: NST   Results for orders placed or performed in visit on 09/26/17 (from the past 24 hour(s))  POCT urinalysis dipstick   Collection Time: 09/26/17  9:46 AM  Result Value Ref Range   Color, UA     Clarity, UA     Glucose, UA neg    Bilirubin, UA     Ketones, UA neg    Spec Grav, UA  1.010 - 1.025   Blood, UA neg    pH, UA  5.0 - 8.0   Protein, UA neg    Urobilinogen, UA  0.2 or 1.0 E.U./dL   Nitrite, UA neg    Leukocytes, UA Large (3+) (A) Negative   Appearance     Odor      Assessment & Plan:  1) High-risk pregnancy G2P0010 at [redacted]w[redacted]d with an Estimated Date of Delivery: 11/17/17   2) A1DM, seeing dietician 09/27/2017  3) ICP, stable  Meds: No orders of the defined types were placed in this encounter.   Labs/procedures today: NST= reactive  Treatment Plan:   1. Growth u/s @ 36    2. 2x week testing  3. Induced  @ 37wks  Follow-up: Return in about 3 days (around 09/29/2017) for NST, HROB.  Orders Placed This Encounter  Procedures  . POCT urinalysis dipstick   By signing my  name below, I, Margit Banda, attest that this documentation has been prepared under the direction and in the presence of Eure, Mertie Clause, MD. Electronically Signed: Margit Banda, Medical Scribe. 09/26/17. 10:35 AM.  I personally performed the services described in this documentation, which was SCRIBED in my presence. The recorded information has been reviewed and considered accurate. It has been edited as necessary during review. Jonnie Kind, MD

## 2017-09-27 ENCOUNTER — Encounter: Payer: Self-pay | Admitting: Nutrition

## 2017-09-27 ENCOUNTER — Encounter: Payer: Medicaid Other | Attending: Obstetrics and Gynecology | Admitting: Nutrition

## 2017-09-27 VITALS — Ht 59.0 in | Wt 192.0 lb

## 2017-09-27 DIAGNOSIS — O24410 Gestational diabetes mellitus in pregnancy, diet controlled: Secondary | ICD-10-CM | POA: Diagnosis not present

## 2017-09-27 DIAGNOSIS — Z713 Dietary counseling and surveillance: Secondary | ICD-10-CM | POA: Insufficient documentation

## 2017-09-27 DIAGNOSIS — O24419 Gestational diabetes mellitus in pregnancy, unspecified control: Secondary | ICD-10-CM

## 2017-09-27 NOTE — Progress Notes (Signed)
  Diabetes Self-Management Education  Patient was seen on 5-21-19for Gestational Diabetes self-management class at [facility].    Thank you for referring this patient to the [facility].   The information provided summarizes the assessment and the patient's goals, which were established at this visit.  She didn't have diabetes before pregnancy. Family history of DM Type 2. She forgot meter and BS logs.  This am was 95,  before lunch 122 and before dinner 118 and 2 hours after supper was  155 mg/dl. Pre-Pregnancy 170 lbs. Testing 4 times per day. Had a miscarrriage last year. Had bleeding behind her placenta and had cervis opned and lost baby atg 14 weeks. She notes she is anemic. Craves ice a lot. Was put on iron sulfate but it made her sick so she has been taking a prenatal vitamin. Is being treated for Cholestasis during pregnancy. She reports good fetal movement of baby in am and early afternoons or when she is hungry.  [facility] 11/17/17 but will induce in 2 weeks due to cholestasis she reports..  The following learning objectives were met by the patient during this course:   States the definition of Gestational Diabetes  States why dietary management is important in controlling blood glucose  Describes the effects each nutrient has on blood glucose levels  Demonstrates ability to create a balanced meal plan  Demonstrates carbohydrate counting   States when to check blood glucose levels  Demonstrates proper blood glucose monitoring techniques  States the effect of stress and exercise on blood glucose levels  States the importance of limiting caffeine and abstaining from alcohol and smoking Pt. Already has a meter.  Patient instructed to monitor glucose levels: FBS: 60 - <90 1 hour: <140 2 hour: <120  *Patient received handouts:  Nutrition Diabetes and Pregnancy  Carbohydrate Counting List   Follow Gestational  Meal Plan Follow Gestational Meal Plan Eat meals on times discussed Drink only water Cut out sodas, juices and sweets, ice cream. Test blood sugar before breakast and 2 hours after each meal FBS should be less than 90 and 2 hours after each meal. Bring meter and BS log to next visit. Call MD if BS are over 120's.   Patient will be seen for follow-up in 1 week with meter and logs.

## 2017-09-27 NOTE — Patient Instructions (Signed)
Follow Gestational Meal Plan Follow Gestational Meal Plan Eat meals on times discussed Drink only water Cut out sodas, juices and sweets, ice cream. Test blood sugar before breakast and 2 hours after each meal FBS should be less than 90 and 2 hours after each meal. Bring meter and BS log to next visit. Call MD if BS are over 120's.

## 2017-09-28 ENCOUNTER — Encounter: Payer: Medicaid Other | Admitting: Advanced Practice Midwife

## 2017-09-29 ENCOUNTER — Encounter: Payer: Self-pay | Admitting: Women's Health

## 2017-09-29 ENCOUNTER — Ambulatory Visit (INDEPENDENT_AMBULATORY_CARE_PROVIDER_SITE_OTHER): Payer: Medicaid Other | Admitting: Women's Health

## 2017-09-29 VITALS — BP 116/66 | HR 100 | Wt 193.2 lb

## 2017-09-29 DIAGNOSIS — O24419 Gestational diabetes mellitus in pregnancy, unspecified control: Secondary | ICD-10-CM

## 2017-09-29 DIAGNOSIS — Z3483 Encounter for supervision of other normal pregnancy, third trimester: Secondary | ICD-10-CM

## 2017-09-29 DIAGNOSIS — Z1389 Encounter for screening for other disorder: Secondary | ICD-10-CM

## 2017-09-29 DIAGNOSIS — O26613 Liver and biliary tract disorders in pregnancy, third trimester: Secondary | ICD-10-CM | POA: Diagnosis not present

## 2017-09-29 DIAGNOSIS — K831 Obstruction of bile duct: Secondary | ICD-10-CM | POA: Diagnosis not present

## 2017-09-29 DIAGNOSIS — O0993 Supervision of high risk pregnancy, unspecified, third trimester: Secondary | ICD-10-CM

## 2017-09-29 DIAGNOSIS — O24415 Gestational diabetes mellitus in pregnancy, controlled by oral hypoglycemic drugs: Secondary | ICD-10-CM | POA: Diagnosis not present

## 2017-09-29 DIAGNOSIS — Z3A33 33 weeks gestation of pregnancy: Secondary | ICD-10-CM

## 2017-09-29 DIAGNOSIS — O24410 Gestational diabetes mellitus in pregnancy, diet controlled: Secondary | ICD-10-CM

## 2017-09-29 DIAGNOSIS — Z331 Pregnant state, incidental: Secondary | ICD-10-CM

## 2017-09-29 LAB — POCT URINALYSIS DIPSTICK
Glucose, UA: NEGATIVE
Ketones, UA: NEGATIVE
LEUKOCYTES UA: NEGATIVE
NITRITE UA: NEGATIVE
PROTEIN UA: NEGATIVE
RBC UA: NEGATIVE

## 2017-09-29 MED ORDER — METFORMIN HCL 500 MG PO TABS: 500.0000 mg | ORAL_TABLET | Freq: Two times a day (BID) | ORAL | 3 refills | Status: DC

## 2017-09-29 NOTE — Patient Instructions (Signed)
Stacey Cook, I greatly value your feedback.  If you receive a survey following your visit with [name] today, we appreciate you taking the time to fill it out.  Thanks, Stacey Cook, CNM, WHNP-BC   Call the office [phone]) or go to [facility] if:  You begin to have strong, frequent contractions  Your water breaks.  Sometimes it is a big gush of fluid, sometimes it is just a trickle that keeps getting your panties wet or running down your legs  You have vaginal bleeding.  It is normal to have a small amount of spotting if your cervix was checked.   You don't feel your baby moving like normal.  If you don't, get you something to eat and drink and lay down and focus on feeling your baby move.  You should feel at least 10 movements in 2 hours.  If you don't, you should call the office or go to [place] [facility] Birth Information The normal length of a pregnancy is 39-41 weeks. Preterm labor is when labor starts before 37 completed weeks of pregnancy. What are the risk factors for preterm labor? Preterm labor is more likely to occur in women who:  Have certain infections during pregnancy such as a bladder infection, sexually transmitted infection, or infection inside the uterus (chorioamnionitis).  Have a shorter-than-normal cervix.  Have gone into preterm labor before.  Have had surgery on their cervix.  Are younger than age 63 or older than age 27.  Are African American.  Are pregnant with twins or multiple babies (multiple gestation).  Take street drugs or smoke while pregnant.  Do not gain enough weight while pregnant.  Became pregnant shortly after having been pregnant.  What are the symptoms of preterm labor? Symptoms of preterm labor include:  Cramps similar to those that can happen during a menstrual period. The cramps may happen with diarrhea.  Pain in the abdomen or lower back.  Regular uterine contractions that may feel like tightening of  the abdomen.  A feeling of increased pressure in the pelvis.  Increased watery or bloody mucus discharge from the vagina.  Water breaking (ruptured amniotic sac).  Why is it important to recognize signs of preterm labor? It is important to recognize signs of preterm labor because babies who are born prematurely may not be fully developed. This can put them at an increased risk for:  Long-term (chronic) heart and lung problems.  Difficulty immediately after birth with regulating body systems, including blood sugar, body temperature, heart rate, and breathing rate.  Bleeding in the brain.  Cerebral palsy.  Learning difficulties.  Death.  These risks are highest for babies who are born before 75 weeks of pregnancy. How is preterm labor treated? Treatment depends on the length of your pregnancy, your condition, and the health of your baby. It may involve:  Having a stitch (suture) placed in your cervix to prevent your cervix from opening too early (cerclage).  Taking or being given medicines, such as: ? Hormone medicines. These may be given early in pregnancy to help support the pregnancy. ? Medicine to stop contractions. ? Medicines to help mature the baby's lungs. These may be prescribed if the risk of delivery is high. ? Medicines to prevent your baby from developing cerebral palsy.  If the labor happens before 34 weeks of pregnancy, you may need to stay in the hospital. What should I do if I think I am in preterm labor? If  you think that you are going into preterm labor, call your health care provider right away. How can I prevent preterm labor in future pregnancies? To increase your chance of having a full-term pregnancy:  Do not use any tobacco products, such as cigarettes, chewing tobacco, and e-cigarettes. If you need help quitting, ask your health care provider.  Do not use street drugs or medicines that have not been prescribed to you during your pregnancy.  Talk  with your health care provider before taking any herbal supplements, even if you have been taking them regularly.  Make sure you gain a healthy amount of weight during your pregnancy.  Watch for infection. If you think that you might have an infection, get it checked right away.  Make sure to tell your health care provider if you have gone into preterm labor before.  This information is not intended to replace advice given to you by your health care provider. Make sure you discuss any questions you have with your health care provider. Document Released: 07/17/2003 Document Revised: 10/07/2015 Document Reviewed: 09/17/2015 Elsevier Interactive Patient Education  2018 [employer].

## 2017-09-29 NOTE — Progress Notes (Signed)
HIGH-RISK PREGNANCY VISIT Patient name: Stacey Cook MRN [id number]  Date of birth: April 07, 1996 Chief Complaint:   High Risk Gestation (NST)  History of Present Illness:   Stacey Cook is a 22 y.o. 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  Today she reports not consistently checking cbg's QID; fbs 98, 88, 96, 100 (drank cream soda in middle of night), 2hr pp 105-155 (5 out of 8 >120). Itching still bad. Pressure in pelvis, esp at night.  Contractions: Irregular. Vag. Bleeding: None.  Movement: Present. denies leaking of fluid.  Review of Systems:   Pertinent items are noted in HPI Denies abnormal vaginal discharge w/ itching/odor/irritation, headaches, visual changes, shortness of breath, chest pain, abdominal pain, severe nausea/vomiting, or problems with urination or bowel movements unless otherwise stated above. Pertinent History Reviewed:  Reviewed past medical,surgical, social, obstetrical and family history.  Reviewed problem list, medications and allergies. Physical Assessment:   Vitals:   09/29/17 0948  BP: 116/66  Pulse: 100  Weight: 193 lb 3.2 oz (87.6 kg)  Body mass index is 39.02 kg/m.           Physical Examination:   General appearance: alert, well appearing, and in no distress  Mental status: alert, oriented to person, place, and time  Skin: warm & dry   Extremities: Edema: Trace    Cardiovascular: normal heart rate noted  Respiratory: normal respiratory effort, no distress  Abdomen: gravid, soft, non-tender  Pelvic: Cervical exam deferred         Fetal Status: Fetal Heart Rate (bpm): 135 Fundal Height: 33 cm Movement: Present    Fetal Surveillance Testing today: NST: FHR baseline 135 bpm, Variability: moderate, Accelerations:present, Decelerations:  Absent= Cat 1/Reactive Toco: none     Results for  orders placed or performed in visit on 09/29/17 (from the past 24 hour(s))  POCT Urinalysis Dipstick   Collection Time: 09/29/17  9:51 AM  Result Value Ref Range   Color, UA     Clarity, UA     Glucose, UA Negative Negative   Bilirubin, UA     Ketones, UA neg    Spec Grav, UA  1.010 - 1.025   Blood, UA neg    pH, UA  5.0 - 8.0   Protein, UA Negative Negative   Urobilinogen, UA  0.2 or 1.0 E.U./dL   Nitrite, UA neg    Leukocytes, UA Negative Negative   Appearance     Odor      Assessment & Plan:  1) High-risk pregnancy G2P0010 at [redacted]w[redacted]d with an Estimated Date of Delivery: 11/17/17   2) A2DM, unstable, rx metformin 500mg  BID. Discussed importance of strict glycemic control and adherence to low carb diet during pregnancy as well as potential complications from uncontrolled diabetes during pregnancy.   3) ICP, continue actigall 300mg  TID, if itching not improved next week, may increase dosage  Meds:  Meds ordered this encounter  Medications  . metFORMIN (GLUCOPHAGE) 500 MG tablet    Sig: Take 1 tablet (500 mg total) by mouth 2 (two) times daily with a meal.    Dispense:  60 tablet    Refill:  3    Order Specific Question:   Supervising Provider    Answer:   [name] [[id number]]    Labs/procedures today: nst  Treatment Plan:  U/S @ dx  then q 3wks    Weekly BPP @ 28wks then 2x/wk testing @ 32wks or weekly BPP   Deliver PRN or 37wks:____   Reviewed: Preterm labor symptoms and general obstetric precautions including but not limited to vaginal bleeding, contractions, leaking of fluid and fetal movement were reviewed in detail with the patient.  All questions were answered.  Follow-up: Return for Tues/Fri hrob/nst (ending 6/18)- efw & bpp u/s next Friday instead of  nst.  Orders Placed This Encounter  Procedures  . POCT Urinalysis Dipstick   Roma Schanz CNM, [facility] 09/29/2017 11:43 AM

## 2017-10-04 ENCOUNTER — Encounter: Payer: Self-pay | Admitting: Women's Health

## 2017-10-04 ENCOUNTER — Ambulatory Visit (INDEPENDENT_AMBULATORY_CARE_PROVIDER_SITE_OTHER): Payer: Medicaid Other | Admitting: Women's Health

## 2017-10-04 VITALS — BP 126/90 | HR 96 | Wt 195.8 lb

## 2017-10-04 DIAGNOSIS — O24415 Gestational diabetes mellitus in pregnancy, controlled by oral hypoglycemic drugs: Secondary | ICD-10-CM

## 2017-10-04 DIAGNOSIS — O0990 Supervision of high risk pregnancy, unspecified, unspecified trimester: Secondary | ICD-10-CM

## 2017-10-04 DIAGNOSIS — Z3A33 33 weeks gestation of pregnancy: Secondary | ICD-10-CM | POA: Diagnosis not present

## 2017-10-04 DIAGNOSIS — K831 Obstruction of bile duct: Secondary | ICD-10-CM | POA: Diagnosis not present

## 2017-10-04 DIAGNOSIS — O0993 Supervision of high risk pregnancy, unspecified, third trimester: Secondary | ICD-10-CM

## 2017-10-04 DIAGNOSIS — Z1389 Encounter for screening for other disorder: Secondary | ICD-10-CM

## 2017-10-04 DIAGNOSIS — O26613 Liver and biliary tract disorders in pregnancy, third trimester: Secondary | ICD-10-CM

## 2017-10-04 DIAGNOSIS — O24419 Gestational diabetes mellitus in pregnancy, unspecified control: Secondary | ICD-10-CM

## 2017-10-04 DIAGNOSIS — Z331 Pregnant state, incidental: Secondary | ICD-10-CM

## 2017-10-04 LAB — POCT URINALYSIS DIPSTICK
Blood, UA: NEGATIVE
GLUCOSE UA: NEGATIVE
KETONES UA: NEGATIVE
NITRITE UA: NEGATIVE
PROTEIN UA: NEGATIVE

## 2017-10-04 NOTE — Progress Notes (Signed)
HIGH-RISK PREGNANCY VISIT Patient name: Stacey Cook MRN [id number]  Date of birth: 10/26/1995 Chief Complaint:   High Risk Gestation (NST today; top of stomach stays numb; left side of stomach feels like pressure; + pelvic pressure )  History of Present Illness:   Stacey Cook is a 22 y.o. G56P0010 female at [redacted]w[redacted]d with an Estimated Date of Delivery: 11/17/17 being seen today for ongoing management of a high-risk pregnancy complicated by [id number] 500mg  BID, ICP-actigall 300mg  TID.  Today she reports has not started metformin that was rx'd at last appt b/c of holiday, etc. Hasn't check sugars since last appt b/c left glucometer at her mom's and she is out of town until today. Itching better on actigall 300mg  TID, wants to stick w/ this dose for now. . Top of stomach numb- discussed likely r/t nerves stretching. Ear doctor wants to know if she can have MRI of ear during pregnancy- discussed w/ [facility]- says to wait until after. Contractions: Irregular. Vag. Bleeding: None.  Movement: Present. denies leaking of fluid.  Review of Systems:   Pertinent items are noted in HPI Denies abnormal vaginal discharge w/ itching/odor/irritation, headaches, visual changes, shortness of breath, chest pain, abdominal pain, severe nausea/vomiting, or problems with urination or bowel movements unless otherwise stated above. Pertinent History Reviewed:  Reviewed past medical,surgical, social, obstetrical and family history.  Reviewed problem list, medications and allergies. Physical Assessment:   Vitals:   10/04/17 1039  BP: 126/90  Pulse: 96  Weight: 195 lb 12.8 oz (88.8 kg)  Body mass index is 39.55 kg/m.           Physical Examination:   General appearance: alert, well appearing, and in no distress  Mental status: alert, oriented to person, place, and time  Skin: warm & dry   Extremities: Edema: Trace    Cardiovascular: normal heart rate noted  Respiratory: normal respiratory effort, no  distress  Abdomen: gravid, soft, non-tender  Pelvic: Cervical exam deferred         Fetal Status: Fetal Heart Rate (bpm): 125 Fundal Height: 34 cm Movement: Present    Fetal Surveillance Testing today: NST: FHR baseline 125 bpm, Variability: moderate, Accelerations:present, Decelerations:  Absent= Cat 1/Reactive Toco: ui     Results for orders placed or performed in visit on 10/04/17 (from the past 24 hour(s))  POCT urinalysis dipstick   Collection Time: 10/04/17 10:40 AM  Result Value Ref Range   Color, UA     Clarity, UA     Glucose, UA Negative Negative   Bilirubin, UA     Ketones, UA neg    Spec Grav, UA  1.010 - 1.025   Blood, UA neg    pH, UA  5.0 - 8.0   Protein, UA Negative Negative   Urobilinogen, UA  0.2 or 1.0 E.U./dL   Nitrite, UA neg    Leukocytes, UA Trace (A) Negative   Appearance     Odor      Assessment & Plan:  1) High-risk pregnancy G2P0010 at [redacted]w[redacted]d with an Estimated Date of Delivery: 11/17/17   2) A2DM, currently not taking meds or checking sugars- to pick up meds (metformin 500mg  BID) and begin taking today, go to moms and get glucometer today and resume QID CBGs  3) ICP, stable, continue actigall 300mg  TID  Meds: No orders of the defined types were placed in this encounter.   Labs/procedures today: none  Treatment Plan:  U/S @ dx then q 3wks  2x/wk testing @ 32wks or weekly BPP   Deliver PRN or 37wks  Reviewed: Preterm labor symptoms and general obstetric precautions including but not limited to vaginal bleeding, contractions, leaking of fluid and fetal movement were reviewed in detail with the patient.  All questions were answered.  Follow-up: Return for As scheduled thurs for efw/bpp and hrob.  Orders Placed This Encounter  Procedures  . US OB Follow Up  . US FETAL BPP WO NON STRESS  . POCT urinalysis dipstick   Roma Schanz CNM, [facility] 10/04/2017 12:47 PM

## 2017-10-04 NOTE — Patient Instructions (Signed)
Stacey Cook, I greatly value your feedback.  If you receive a survey following your visit with [name] today, we appreciate you taking the time to fill it out.  Thanks, [name], CNM, WHNP-BC   Call the office [phone]) or go to [facility] if:  You begin to have strong, frequent contractions  Your water breaks.  Sometimes it is a big gush of fluid, sometimes it is just a trickle that keeps getting your panties wet or running down your legs  You have vaginal bleeding.  It is normal to have a small amount of spotting if your cervix was checked.   You don't feel your baby moving like normal.  If you don't, get you something to eat and drink and lay down and focus on feeling your baby move.  You should feel at least 10 movements in 2 hours.  If you don't, you should call the office or go to [place] [facility] Birth Information The normal length of a pregnancy is 39-41 weeks. Preterm labor is when labor starts before 37 completed weeks of pregnancy. What are the risk factors for preterm labor? Preterm labor is more likely to occur in women who:  Have certain infections during pregnancy such as a bladder infection, sexually transmitted infection, or infection inside the uterus (chorioamnionitis).  Have a shorter-than-normal cervix.  Have gone into preterm labor before.  Have had surgery on their cervix.  Are younger than age 20 or older than age 20.  Are African American.  Are pregnant with twins or multiple babies (multiple gestation).  Take street drugs or smoke while pregnant.  Do not gain enough weight while pregnant.  Became pregnant shortly after having been pregnant.  What are the symptoms of preterm labor? Symptoms of preterm labor include:  Cramps similar to those that can happen during a menstrual period. The cramps may happen with diarrhea.  Pain in the abdomen or lower back.  Regular uterine contractions that may feel like tightening of  the abdomen.  A feeling of increased pressure in the pelvis.  Increased watery or bloody mucus discharge from the vagina.  Water breaking (ruptured amniotic sac).  Why is it important to recognize signs of preterm labor? It is important to recognize signs of preterm labor because babies who are born prematurely may not be fully developed. This can put them at an increased risk for:  Long-term (chronic) heart and lung problems.  Difficulty immediately after birth with regulating body systems, including blood sugar, body temperature, heart rate, and breathing rate.  Bleeding in the brain.  Cerebral palsy.  Learning difficulties.  Death.  These risks are highest for babies who are born before 20 weeks of pregnancy. How is preterm labor treated? Treatment depends on the length of your pregnancy, your condition, and the health of your baby. It may involve:  Having a stitch (suture) placed in your cervix to prevent your cervix from opening too early (cerclage).  Taking or being given medicines, such as: ? Hormone medicines. These may be given early in pregnancy to help support the pregnancy. ? Medicine to stop contractions. ? Medicines to help mature the baby's lungs. These may be prescribed if the risk of delivery is high. ? Medicines to prevent your baby from developing cerebral palsy.  If the labor happens before 20 weeks of pregnancy, you may need to stay in the hospital. What should I do if I think I am in preterm labor? If  you think that you are going into preterm labor, call your health care provider right away. How can I prevent preterm labor in future pregnancies? To increase your chance of having a full-term pregnancy:  Do not use any tobacco products, such as cigarettes, chewing tobacco, and e-cigarettes. If you need help quitting, ask your health care provider.  Do not use street drugs or medicines that have not been prescribed to you during your pregnancy.  Talk  with your health care provider before taking any herbal supplements, even if you have been taking them regularly.  Make sure you gain a healthy amount of weight during your pregnancy.  Watch for infection. If you think that you might have an infection, get it checked right away.  Make sure to tell your health care provider if you have gone into preterm labor before.  This information is not intended to replace advice given to you by your health care provider. Make sure you discuss any questions you have with your health care provider. Document Released: 07/17/2003 Document Revised: 10/07/2015 Document Reviewed: 09/17/2015 Elsevier Interactive Patient Education  2018 [employer].

## 2017-10-06 ENCOUNTER — Ambulatory Visit (INDEPENDENT_AMBULATORY_CARE_PROVIDER_SITE_OTHER): Payer: Medicaid Other | Admitting: Women's Health

## 2017-10-06 ENCOUNTER — Ambulatory Visit (INDEPENDENT_AMBULATORY_CARE_PROVIDER_SITE_OTHER): Payer: Medicaid Other

## 2017-10-06 ENCOUNTER — Encounter: Payer: Self-pay | Admitting: Women's Health

## 2017-10-06 VITALS — BP 128/74 | HR 99 | Wt 196.8 lb

## 2017-10-06 DIAGNOSIS — O0993 Supervision of high risk pregnancy, unspecified, third trimester: Secondary | ICD-10-CM

## 2017-10-06 DIAGNOSIS — K831 Obstruction of bile duct: Secondary | ICD-10-CM

## 2017-10-06 DIAGNOSIS — O26613 Liver and biliary tract disorders in pregnancy, third trimester: Secondary | ICD-10-CM | POA: Diagnosis not present

## 2017-10-06 DIAGNOSIS — F909 Attention-deficit hyperactivity disorder, unspecified type: Secondary | ICD-10-CM

## 2017-10-06 DIAGNOSIS — Z3A34 34 weeks gestation of pregnancy: Secondary | ICD-10-CM | POA: Diagnosis not present

## 2017-10-06 DIAGNOSIS — O24415 Gestational diabetes mellitus in pregnancy, controlled by oral hypoglycemic drugs: Secondary | ICD-10-CM | POA: Diagnosis not present

## 2017-10-06 DIAGNOSIS — F5081 Binge eating disorder: Secondary | ICD-10-CM

## 2017-10-06 DIAGNOSIS — O9989 Other specified diseases and conditions complicating pregnancy, childbirth and the puerperium: Secondary | ICD-10-CM

## 2017-10-06 DIAGNOSIS — F913 Oppositional defiant disorder: Secondary | ICD-10-CM

## 2017-10-06 DIAGNOSIS — O24419 Gestational diabetes mellitus in pregnancy, unspecified control: Secondary | ICD-10-CM

## 2017-10-06 DIAGNOSIS — O0990 Supervision of high risk pregnancy, unspecified, unspecified trimester: Secondary | ICD-10-CM

## 2017-10-06 DIAGNOSIS — O99343 Other mental disorders complicating pregnancy, third trimester: Secondary | ICD-10-CM | POA: Diagnosis not present

## 2017-10-06 DIAGNOSIS — H9190 Unspecified hearing loss, unspecified ear: Secondary | ICD-10-CM | POA: Diagnosis not present

## 2017-10-06 NOTE — Patient Instructions (Addendum)
Stacey Cook, I greatly value your feedback.  If you receive a survey following your visit with [name] today, we appreciate you taking the time to fill it out.  Thanks, [name], CNM, WHNP-BC   Call the office [phone]) or go to [facility] if:  You begin to have strong, frequent contractions  Your water breaks.  Sometimes it is a big gush of fluid, sometimes it is just a trickle that keeps getting your panties wet or running down your legs  You have vaginal bleeding.  It is normal to have a small amount of spotting if your cervix was checked.   You don't feel your baby moving like normal.  If you don't, get you something to eat and drink and lay down and focus on feeling your baby move.  You should feel at least 10 movements in 2 hours.  If you don't, you should call the office or go to [place] [facility] Birth Information The normal length of a pregnancy is 39-41 weeks. Preterm labor is when labor starts before 37 completed weeks of pregnancy. What are the risk factors for preterm labor? Preterm labor is more likely to occur in women who:  Have certain infections during pregnancy such as a bladder infection, sexually transmitted infection, or infection inside the uterus (chorioamnionitis).  Have a shorter-than-normal cervix.  Have gone into preterm labor before.  Have had surgery on their cervix.  Are younger than age 63 or older than age 27.  Are African American.  Are pregnant with twins or multiple babies (multiple gestation).  Take street drugs or smoke while pregnant.  Do not gain enough weight while pregnant.  Became pregnant shortly after having been pregnant.  What are the symptoms of preterm labor? Symptoms of preterm labor include:  Cramps similar to those that can happen during a menstrual period. The cramps may happen with diarrhea.  Pain in the abdomen or lower back.  Regular uterine contractions that may feel like tightening of  the abdomen.  A feeling of increased pressure in the pelvis.  Increased watery or bloody mucus discharge from the vagina.  Water breaking (ruptured amniotic sac).  Why is it important to recognize signs of preterm labor? It is important to recognize signs of preterm labor because babies who are born prematurely may not be fully developed. This can put them at an increased risk for:  Long-term (chronic) heart and lung problems.  Difficulty immediately after birth with regulating body systems, including blood sugar, body temperature, heart rate, and breathing rate.  Bleeding in the brain.  Cerebral palsy.  Learning difficulties.  Death.  These risks are highest for babies who are born before 75 weeks of pregnancy. How is preterm labor treated? Treatment depends on the length of your pregnancy, your condition, and the health of your baby. It may involve:  Having a stitch (suture) placed in your cervix to prevent your cervix from opening too early (cerclage).  Taking or being given medicines, such as: ? Hormone medicines. These may be given early in pregnancy to help support the pregnancy. ? Medicine to stop contractions. ? Medicines to help mature the baby's lungs. These may be prescribed if the risk of delivery is high. ? Medicines to prevent your baby from developing cerebral palsy.  If the labor happens before 34 weeks of pregnancy, you may need to stay in the hospital. What should I do if I think I am in preterm labor? If  you think that you are going into preterm labor, call your health care provider right away. How can I prevent preterm labor in future pregnancies? To increase your chance of having a full-term pregnancy:  Do not use any tobacco products, such as cigarettes, chewing tobacco, and e-cigarettes. If you need help quitting, ask your health care provider.  Do not use street drugs or medicines that have not been prescribed to you during your pregnancy.  Talk  with your health care provider before taking any herbal supplements, even if you have been taking them regularly.  Make sure you gain a healthy amount of weight during your pregnancy.  Watch for infection. If you think that you might have an infection, get it checked right away.  Make sure to tell your health care provider if you have gone into preterm labor before.  This information is not intended to replace advice given to you by your health care provider. Make sure you discuss any questions you have with your health care provider. Document Released: 07/17/2003 Document Revised: 10/07/2015 Document Reviewed: 09/17/2015 Elsevier Interactive Patient Education  2018 [employer].   Iron-Rich Diet Iron is a mineral that helps your body to produce hemoglobin. Hemoglobin is a protein in your red blood cells that carries oxygen to your body's tissues. Eating too little iron may cause you to feel weak and tired, and it can increase your risk for infection. Eating enough iron is necessary for your body's metabolism, muscle function, and nervous system. Iron is naturally found in many foods. It can also be added to foods or fortified in foods. There are two types of dietary iron:  Heme iron. Heme iron is absorbed by the body more easily than nonheme iron. Heme iron is found in meat, poultry, and fish.  Nonheme iron. Nonheme iron is found in dietary supplements, iron-fortified grains, beans, and vegetables.  You may need to follow an iron-rich diet if:  You have been diagnosed with iron deficiency or iron-deficiency anemia.  You have a condition that prevents you from absorbing dietary iron, such as: ? Infection in your intestines. ? Celiac disease. This involves long-lasting (chronic) inflammation of your intestines.  You do not eat enough iron.  You eat a diet that is high in foods that impair iron absorption.  You have lost a lot of blood.  You have heavy bleeding during your menstrual  cycle.  You are pregnant.  What is my plan? Your health care provider may help you to determine how much iron you need per day based on your condition. Generally, when a person consumes sufficient amounts of iron in the diet, the following iron needs are met:  Men. ? 14-72 years old: 11 mg per day. ? 74-40 years old: 8 mg per day.  Women. ? 44-36 years old: 15 mg per day. ? 64-74 years old: 18 mg per day. ? Over 29 years old: 8 mg per day. ? Pregnant women: 27 mg per day. ? Breastfeeding women: 9 mg per day.  What do I need to know about an iron-rich diet?  Eat fresh fruits and vegetables that are high in vitamin C along with foods that are high in iron. This will help increase the amount of iron that your body absorbs from food, especially with foods containing nonheme iron. Foods that are high in vitamin C include oranges, peppers, tomatoes, and mango.  Take iron supplements only as directed by your health care provider. Overdose of iron can be life-threatening. If  you were prescribed iron supplements, take them with orange juice or a vitamin C supplement.  Cook foods in pots and pans that are made from iron.  Eat nonheme iron-containing foods alongside foods that are high in heme iron. This helps to improve your iron absorption.  Certain foods and drinks contain compounds that impair iron absorption. Avoid eating these foods in the same meal as iron-rich foods or with iron supplements. These include: ? Coffee, black tea, and red wine. ? Milk, dairy products, and foods that are high in calcium. ? Beans, soybeans, and peas. ? Whole grains.  When eating foods that contain both nonheme iron and compounds that impair iron absorption, follow these tips to absorb iron better. ? Soak beans overnight before cooking. ? Soak whole grains overnight and drain them before using. ? Ferment flours before baking, such as using yeast in bread dough. What foods can I  eat? Grains Iron-fortified breakfast cereal. Iron-fortified whole-wheat bread. Enriched rice. Sprouted grains. Vegetables Spinach. Potatoes with skin. Green peas. Broccoli. Red and green bell peppers. Fermented vegetables. Fruits Prunes. Raisins. Oranges. Strawberries. Mango. Grapefruit. Meats and Other Protein Sources Beef liver. Oysters. Beef. Shrimp. [place]. Chicken. [place]. Sardines. Chickpeas. Nuts. Tofu. Beverages Tomato juice. Fresh orange juice. Prune juice. Hibiscus tea. Fortified instant breakfast shakes. Condiments Tahini. Fermented soy sauce. Sweets and Desserts Black-strap molasses. Other Wheat germ. The items listed above may not be a complete list of recommended foods or beverages. Contact your dietitian for more options. What foods are not recommended? Grains Whole grains. Bran cereal. Bran flour. Oats. Vegetables Artichokes. Brussels sprouts. Kale. Fruits Blueberries. Raspberries. Strawberries. Figs. Meats and Other Protein Sources Soybeans. Products made from soy protein. Dairy Milk. Cream. Cheese. Yogurt. Cottage cheese. Beverages Coffee. Black tea. Red wine. Sweets and Desserts Cocoa. Chocolate. Ice cream. Other Basil. Oregano. Parsley. The items listed above may not be a complete list of foods and beverages to avoid. Contact your dietitian for more information. This information is not intended to replace advice given to you by your health care provider. Make sure you discuss any questions you have with your health care provider. Document Released: 12/08/2004 Document Revised: 11/14/2015 Document Reviewed: 11/21/2013 Elsevier Interactive Patient Education  [employer].

## 2017-10-06 NOTE — Progress Notes (Signed)
   HIGH-RISK PREGNANCY VISIT Patient name: Stacey Cook MRN [id number]  Date of birth: 1995/05/20 Chief Complaint:   Routine Prenatal Visit ([name] today)  History of Present Illness:   Stacey Cook is a 22 y.o. G2P0010 female at [redacted]w[redacted]d with an Estimated Date of Delivery: 11/17/17 being seen today for ongoing management of a high-risk pregnancy complicated by G2EZ- metformin 500mg  BID, ICP-actigall 300mg  TID.  Today she reports CBGs yesterday 86/110/92, FBS this am 110. Ate 1/2 bag of dougnuts last nigth b/c felt like sugar was low (92). Eating things like corndogs, cheese fries, etc. States she did go to dietician, is trying to eat better, 'but it's hard'.  Contractions: Irregular. Vag. Bleeding: None.  Movement: Present. denies leaking of fluid.  Review of Systems:   Pertinent items are noted in HPI Denies abnormal vaginal discharge w/ itching/odor/irritation, headaches, visual changes, shortness of breath, chest pain, abdominal pain, severe nausea/vomiting, or problems with urination or bowel movements unless otherwise stated above. Pertinent History Reviewed:  Reviewed past medical,surgical, social, obstetrical and family history.  Reviewed problem list, medications and allergies. Physical Assessment:   Vitals:   10/06/17 1520  BP: 128/74  Pulse: 99  Weight: 196 lb 12.8 oz (89.3 kg)  Body mass index is 39.75 kg/m.           Physical Examination:   General appearance: alert, well appearing, and in no distress  Mental status: alert, oriented to person, place, and time  Skin: warm & dry   Extremities: Edema: Trace    Cardiovascular: normal heart rate noted  Respiratory: normal respiratory effort, no distress  Abdomen: gravid, soft, non-tender  Pelvic: Cervical exam deferred         Fetal Status: Fetal Heart Rate (bpm): 144 Fundal Height: 35 cm Movement: Present    Fetal Surveillance Testing today: having efw/bpp u/s today after visit   No results found for this or any previous  visit (from the past 24 hour(s)).  Assessment & Plan:  1) High-risk pregnancy G2P0010 at [redacted]w[redacted]d with an Estimated Date of Delivery: 11/17/17   2) A2DM, non-compliant w/ diet, continue metformin 500mg  BID, check sugars QID as directed. Keep f/u appt w/ dietician on 6/3  3) Cholestasis, stable, continue actigall 300mg  TID  Meds: No orders of the defined types were placed in this encounter.   Labs/procedures today: u/s  Treatment Plan:  U/S @ dx then q 3wks    2x/wk testing   Deliver PRN or 37wks  Reviewed: Preterm labor symptoms and general obstetric precautions including but not limited to vaginal bleeding, contractions, leaking of fluid and fetal movement were reviewed in detail with the patient.  All questions were answered.  Follow-up: Return for As scheduled Tues for hrob/nst.  Orders Placed This Encounter  Procedures  . POCT Urinalysis Dipstick   Roma Schanz CNM, WHNP-BC 10/06/2017 5:00 PM

## 2017-10-06 NOTE — Progress Notes (Signed)
[name] 34 wks,cephalic,BPP 0/2,CHT 981 bpm,anterior pl gr 1,EFW 2802 g 91%,AFI 13 cm

## 2017-10-09 ENCOUNTER — Inpatient Hospital Stay (HOSPITAL_COMMUNITY)
Admission: AD | Admit: 2017-10-09 | Discharge: 2017-10-09 | Disposition: A | Discharge status: Home / Self Care | Payer: Medicaid Other | Source: Ambulatory Visit | Attending: Family Medicine | Admitting: Family Medicine

## 2017-10-09 ENCOUNTER — Encounter (HOSPITAL_COMMUNITY): Payer: Self-pay | Admitting: *Deleted

## 2017-10-09 DIAGNOSIS — Z841 Family history of disorders of kidney and ureter: Secondary | ICD-10-CM | POA: Diagnosis not present

## 2017-10-09 DIAGNOSIS — Z8659 Personal history of other mental and behavioral disorders: Secondary | ICD-10-CM | POA: Insufficient documentation

## 2017-10-09 DIAGNOSIS — R109 Unspecified abdominal pain: Secondary | ICD-10-CM | POA: Insufficient documentation

## 2017-10-09 DIAGNOSIS — O26893 Other specified pregnancy related conditions, third trimester: Secondary | ICD-10-CM | POA: Insufficient documentation

## 2017-10-09 DIAGNOSIS — M549 Dorsalgia, unspecified: Secondary | ICD-10-CM | POA: Diagnosis not present

## 2017-10-09 DIAGNOSIS — Z888 Allergy status to other drugs, medicaments and biological substances status: Secondary | ICD-10-CM | POA: Diagnosis not present

## 2017-10-09 DIAGNOSIS — Z803 Family history of malignant neoplasm of breast: Secondary | ICD-10-CM | POA: Diagnosis not present

## 2017-10-09 DIAGNOSIS — Z8249 Family history of ischemic heart disease and other diseases of the circulatory system: Secondary | ICD-10-CM | POA: Insufficient documentation

## 2017-10-09 DIAGNOSIS — Z3A34 34 weeks gestation of pregnancy: Secondary | ICD-10-CM | POA: Insufficient documentation

## 2017-10-09 DIAGNOSIS — Z825 Family history of asthma and other chronic lower respiratory diseases: Secondary | ICD-10-CM | POA: Diagnosis not present

## 2017-10-09 DIAGNOSIS — Z881 Allergy status to other antibiotic agents status: Secondary | ICD-10-CM | POA: Diagnosis not present

## 2017-10-09 DIAGNOSIS — Z87891 Personal history of nicotine dependence: Secondary | ICD-10-CM | POA: Insufficient documentation

## 2017-10-09 DIAGNOSIS — Z833 Family history of diabetes mellitus: Secondary | ICD-10-CM | POA: Diagnosis not present

## 2017-10-09 DIAGNOSIS — Z823 Family history of stroke: Secondary | ICD-10-CM | POA: Diagnosis not present

## 2017-10-09 DIAGNOSIS — O0990 Supervision of high risk pregnancy, unspecified, unspecified trimester: Secondary | ICD-10-CM

## 2017-10-09 DIAGNOSIS — Z8709 Personal history of other diseases of the respiratory system: Secondary | ICD-10-CM | POA: Diagnosis not present

## 2017-10-09 DIAGNOSIS — O24415 Gestational diabetes mellitus in pregnancy, controlled by oral hypoglycemic drugs: Secondary | ICD-10-CM | POA: Insufficient documentation

## 2017-10-09 DIAGNOSIS — Z9889 Other specified postprocedural states: Secondary | ICD-10-CM | POA: Insufficient documentation

## 2017-10-09 LAB — URINALYSIS, ROUTINE W REFLEX MICROSCOPIC
Bilirubin Urine: NEGATIVE
Glucose, UA: NEGATIVE mg/dL
Hgb urine dipstick: NEGATIVE
KETONES UR: NEGATIVE mg/dL
Nitrite: NEGATIVE
PH: 8 (ref 5.0–8.0)
PROTEIN: NEGATIVE mg/dL
Specific Gravity, Urine: 1.009 (ref 1.005–1.030)

## 2017-10-09 NOTE — MAU Note (Signed)
Pt reports contractions that started last night. States she feels them every 10 mins. States she also constant low back pain. Pt denies LOF or vaginal bleeding. Reports good fetal movement.

## 2017-10-09 NOTE — MAU Provider Note (Signed)
History   G2P0010 @ 34.3 wks in with sharp shooting abd pain associated with movement.denies ROM or vag bleeding. Good fetal movement. Pt is GDM on metformin.  CSN: [id number]  Arrival date & time 10/09/17  1914   None     Chief Complaint  Patient presents with  . Contractions  . Back Pain    HPI  Past Medical History:  Diagnosis Date  . Abdominal pain   . ADD (attention deficit disorder)   . Anginal pain ([place])   . Anxiety   . Arthritis   . Asthma   . 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 Cervicalgia   . Chronic abdominal pain   . Constipation   . Depression   . Ear mass   . Gestational diabetes   . Gestational diabetes   . Headache   . HSV infection   . Low iron   . Suicidal intent   . UTI (lower urinary tract infection) 05/2014  . Vomiting     Past Surgical History:  Procedure Laterality Date  . CHOLESTEATOMA EXCISION    . DILATION AND EVACUATION N/A 09/11/2016   Procedure: DILATATION AND CURRATAGE  2ND TRIMESTER;  Surgeon: Jonnie Kind, MD;  Location: [facility];  Service: Gynecology;  Laterality: N/A;  . IMPLANTATION BONE ANCHORED HEARING AID Right 04/2013  . 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 TYMPANOPLASTY Left   . TYMPANOSTOMY      Family History  Problem Relation Age of Onset  . Cholelithiasis Mother   . Kidney disease Mother        stones  . Depression Mother   . Hypertension Maternal Grandmother   . Diabetes Maternal Grandmother   . Stroke Maternal Grandmother   . Seizures Maternal Grandmother   . Asthma Maternal Grandmother   . Hyperlipidemia Maternal Grandmother   . Thyroid disease Maternal Grandmother   . Asthma Brother   . Seizures Maternal Uncle   . Cancer Other        breast- great aunt  . Celiac disease Neg Hx     Social History   Tobacco Use  . Smoking status: Former Smoker    Packs/day: 0.00    Years: 1.00    Pack years: 0.00    Types: Cigarettes  . Smokeless tobacco:  Never Used  . Tobacco comment: smokes 10 cig daily  Substance Use Topics  . Alcohol use: No    Alcohol/week: 0.0 oz  . Drug use: No    OB History    Gravida  2   Para  0   Term  0   Preterm  0   AB  1   Living  0     SAB  1   TAB  0   Ectopic  0   Multiple  0   Live Births  0           Review of Systems  Constitutional: Negative.   HENT: Negative.   Eyes: Negative.   Respiratory: Negative.   Cardiovascular: Negative.   Gastrointestinal: Positive for abdominal pain.  Endocrine: Negative.   Genitourinary: Negative.   Musculoskeletal: Negative.   Skin: Negative.   Allergic/Immunologic: Negative.   Neurological: Negative.   Hematological: Negative.   Psychiatric/Behavioral: Negative.     Allergies  Keflex [cephalexin]; Other; and Adhesive [tape]  Home Medications    BP 125/74 (BP Location: Right Arm)   Pulse (!) 108   Temp (!)  97.5 F (36.4 C) (Oral)   Resp 18   Ht 4\' 11"  (1.499 m)   Wt 196 lb (88.9 kg)   LMP 02/10/2017   SpO2 98%   BMI 39.59 kg/m   Physical Exam  Constitutional: She is oriented to person, place, and time. She appears well-developed and well-nourished.  HENT:  Head: Normocephalic.  Neck: Normal range of motion.  Cardiovascular: Normal rate, regular rhythm, normal heart sounds and intact distal pulses.  Pulmonary/Chest: Effort normal and breath sounds normal.  Abdominal: Soft. Bowel sounds are normal.  Genitourinary: Vagina normal and uterus normal.  Musculoskeletal: Normal range of motion.  Neurological: She is alert and oriented to person, place, and time.  Skin: Skin is warm and dry.  Psychiatric: She has a normal mood and affect. Her behavior is normal. Judgment and thought content normal.    MAU Course  Procedures (including critical care time)  Labs Reviewed  URINALYSIS, ROUTINE W REFLEX MICROSCOPIC   No results found.   1. Abdominal pain in pregnancy, third trimester   2. Supervision of high risk  pregnancy, antepartum       MDM  FHR pattern reassuring 145-150 with 15x15 accels no decels. Isolated occasional contraction. SVE cl/th/post/high. Only isolated occasiona l mild uc will d/c home

## 2017-10-09 NOTE — MAU Note (Signed)
Pt reports that she woke up from a nap this afternoon and was having cramping. Pain was 6 minutes apart with constant back pain and vaginal pressure.  Pt denies LOF or Vaginal Bleeding. + FM

## 2017-10-09 NOTE — Discharge Instructions (Signed)

## 2017-10-10 ENCOUNTER — Encounter: Payer: Self-pay | Admitting: Nutrition

## 2017-10-10 ENCOUNTER — Encounter: Payer: Medicaid Other | Attending: Obstetrics and Gynecology | Admitting: Nutrition

## 2017-10-10 VITALS — Wt 195.0 lb

## 2017-10-10 DIAGNOSIS — Z713 Dietary counseling and surveillance: Secondary | ICD-10-CM | POA: Insufficient documentation

## 2017-10-10 DIAGNOSIS — O24410 Gestational diabetes mellitus in pregnancy, diet controlled: Secondary | ICD-10-CM | POA: Insufficient documentation

## 2017-10-10 NOTE — Progress Notes (Signed)
  Diabetes Self-Management Education  Patient was seen on 09-27-17 and F/u on 10-10-17 for Gestational Diabetes self-management class at [facility].    Thank you for referring this patient to the [facility].   The information provided summarizes the assessment and the patient's goals, which were established at this visit. Went to ER last night due to contractions. Good fetal movement. Started on Metformin 500 mg BID. BS are doing much better. Less than 95 before breakfast and less than 120 mg/dl 2 hours after meals. Trying to avoid sweets, She forgot meter and BS logs.  Pre-Pregnancy 170 lbs. Testing 3-4 times per day.  Is being treated for Cholestasis during pregnancy. She reports good fetal movement of baby in am and early afternoons or when she is hungry.  [facility] 11[facility]17[facility]17 but will induce in 2 weeks early due to cholestasis she reports..  The following learning objectives were met by the patient during this course:   States the definition of Gestational Diabetes  States why dietary management is important in controlling blood glucose  Describes the effects each nutrient has on blood glucose levels  Demonstrates ability to create a balanced meal plan  Demonstrates carbohydrate counting   States when to check blood glucose levels  Demonstrates proper blood glucose monitoring techniques  States the effect of stress and exercise on blood glucose levels  States the importance of limiting caffeine and abstaining from alcohol and smoking Pt. Already has a meter.  Patient instructed to monitor glucose levels: FBS: 60 - <90 1 hour: <140 2 hour: <120  *Patient received handouts:  Nutrition Diabetes and Pregnancy  Carbohydrate Counting List   Follow Gestational Meal Plan Follow Gestational Meal Plan Eat meals on times discussed Drink only water Cut out sodas, juices and sweets, ice cream. Test blood sugar before  breakast and 2 hours after each meal FBS should be less than 90 and 2 hours after each meal. Bring meter and BS log to next visit. Call MD if BS are over 120's.   Patient will be seen for follow-up in 1 week with meter and logs.

## 2017-10-10 NOTE — Patient Instructions (Addendum)
Goals Continue to follow meal plan Avoid sweets Walk when you can Keep FBS less than 95 mg/dl and 2 hour after meals BS less than 120 mg/dl.  Notify you OBGYN if you have an elevated BS. Call  Me anytime if you have any questions.

## 2017-10-10 NOTE — Addendum Note (Signed)
Addended by: Octaviano Glow on: 10/10/2017 04:27 PM   Modules accepted: Orders

## 2017-10-11 ENCOUNTER — Ambulatory Visit (INDEPENDENT_AMBULATORY_CARE_PROVIDER_SITE_OTHER): Payer: Medicaid Other | Admitting: Obstetrics & Gynecology

## 2017-10-11 ENCOUNTER — Encounter: Payer: Self-pay | Admitting: Obstetrics & Gynecology

## 2017-10-11 VITALS — BP 121/74 | HR 98 | Wt 198.5 lb

## 2017-10-11 DIAGNOSIS — Z3A34 34 weeks gestation of pregnancy: Secondary | ICD-10-CM | POA: Diagnosis not present

## 2017-10-11 DIAGNOSIS — K831 Obstruction of bile duct: Secondary | ICD-10-CM | POA: Diagnosis not present

## 2017-10-11 DIAGNOSIS — Z23 Encounter for immunization: Secondary | ICD-10-CM | POA: Diagnosis not present

## 2017-10-11 DIAGNOSIS — O24415 Gestational diabetes mellitus in pregnancy, controlled by oral hypoglycemic drugs: Secondary | ICD-10-CM

## 2017-10-11 DIAGNOSIS — O0990 Supervision of high risk pregnancy, unspecified, unspecified trimester: Secondary | ICD-10-CM

## 2017-10-11 DIAGNOSIS — O26613 Liver and biliary tract disorders in pregnancy, third trimester: Secondary | ICD-10-CM | POA: Diagnosis not present

## 2017-10-11 DIAGNOSIS — O24419 Gestational diabetes mellitus in pregnancy, unspecified control: Secondary | ICD-10-CM

## 2017-10-11 DIAGNOSIS — O99613 Diseases of the digestive system complicating pregnancy, third trimester: Secondary | ICD-10-CM

## 2017-10-11 DIAGNOSIS — K219 Gastro-esophageal reflux disease without esophagitis: Secondary | ICD-10-CM | POA: Diagnosis not present

## 2017-10-11 DIAGNOSIS — F418 Other specified anxiety disorders: Secondary | ICD-10-CM

## 2017-10-11 DIAGNOSIS — Z1389 Encounter for screening for other disorder: Secondary | ICD-10-CM

## 2017-10-11 DIAGNOSIS — Z331 Pregnant state, incidental: Secondary | ICD-10-CM

## 2017-10-11 LAB — POCT URINALYSIS DIPSTICK
Glucose, UA: NEGATIVE
Ketones, UA: NEGATIVE
NITRITE UA: NEGATIVE
PROTEIN UA: NEGATIVE

## 2017-10-11 MED ORDER — METRONIDAZOLE 0.75 % VA GEL: VAGINAL | 0 refills | Status: DC

## 2017-10-11 NOTE — Progress Notes (Signed)
Patient ID: DEMETRICE AMSTUTZ, female   DOB: 1995-08-12, 22 y.o.   MRN: [id number]   [facility] PREGNANCY VISIT Patient name: Stacey Cook MRN [id number]  Date of birth: 05-16-1995 Chief Complaint:   High Risk Gestation (NST)  History of Present Illness:   Stacey Cook is a 22 y.o. G51P0010 female at [redacted]w[redacted]d with an Estimated Date of Delivery: 11/17/17 being seen today for ongoing management of a high-risk pregnancy complicated by Class A2 DM, ICP.  Today she reports back ache, GERD. Contractions: Irregular. Vag. Bleeding: None.  Movement: Present. denies leaking of fluid.  Review of Systems:   Pertinent items are noted in HPI Denies abnormal vaginal discharge w/ itching/odor/irritation, headaches, visual changes, shortness of breath, chest pain, abdominal pain, severe nausea/vomiting, or problems with urination or bowel movements unless otherwise stated above. Pertinent History Reviewed:  Reviewed past medical,surgical, social, obstetrical and family history.  Reviewed problem list, medications and allergies. Physical Assessment:   Vitals:   10/11/17 0945  BP: 121/74  Pulse: 98  Weight: 198 lb 8 oz (90 kg)  Body mass index is 40.09 kg/m.           Physical Examination:   General appearance: alert, well appearing, and in no distress  Mental status: alert, oriented to person, place, and time  Skin: warm & dry   Extremities: Edema: Trace    Cardiovascular: normal heart rate noted  Respiratory: normal respiratory effort, no distress  Abdomen: gravid, soft, non-tender  Pelvic: Cervical exam deferred         Fetal Status:     Movement: Present    Fetal Surveillance Testing today: Reactive NST   Results for orders placed or performed in visit on 10/11/17 (from the past 24 hour(s))  POCT urinalysis dipstick   Collection Time: 10/11/17  9:46 AM  Result Value Ref Range   Color, UA     Clarity, UA     Glucose, UA Negative Negative   Bilirubin, UA     Ketones, UA neg    Spec Grav, UA   1.010 - 1.025   Blood, UA trace    pH, UA  5.0 - 8.0   Protein, UA Negative Negative   Urobilinogen, UA  0.2 or 1.0 E.U./dL   Nitrite, UA neg    Leukocytes, UA Moderate (2+) (A) Negative   Appearance     Odor      Assessment & Plan:  1) High-risk pregnancy G2P0010 at [redacted]w[redacted]d with an Estimated Date of Delivery: 11/17/17   2) Class A2 DM, stable, Metformin 500 BID  3) ICP, stable, Actigall 300 TID  Meds:  Meds ordered this encounter  Medications  . metroNIDAZOLE (METROGEL VAGINAL) 0.75 % vaginal gel    Sig: Nightly x 5 nights    Dispense:  70 g    Refill:  0    Labs/procedures today: Reactive NST  Treatment Plan:  IOL 37 weeks  Reviewed: Preterm labor symptoms and general obstetric precautions including but not limited to vaginal bleeding, contractions, leaking of fluid and fetal movement were reviewed in detail with the patient.  All questions were answered.  Follow-up: Return in about 3 days (around 10/14/2017) for NST, HROB.  Orders Placed This Encounter  Procedures  . Tdap vaccine greater than or equal to 7yo IM  . POCT urinalysis dipstick   [name] MD 10/11/2017 11:06 AM

## 2017-10-11 NOTE — Progress Notes (Signed)
White discharge with odor. Pt received Tdap in left deltoid and tolerated shot well. [place]

## 2017-10-14 ENCOUNTER — Ambulatory Visit (INDEPENDENT_AMBULATORY_CARE_PROVIDER_SITE_OTHER): Payer: Medicaid Other | Admitting: Women's Health

## 2017-10-14 ENCOUNTER — Other Ambulatory Visit: Payer: Medicaid Other

## 2017-10-14 ENCOUNTER — Other Ambulatory Visit: Payer: Self-pay

## 2017-10-14 ENCOUNTER — Encounter: Payer: Self-pay | Admitting: Women's Health

## 2017-10-14 VITALS — BP 126/72 | HR 114 | Wt 197.0 lb

## 2017-10-14 DIAGNOSIS — O0990 Supervision of high risk pregnancy, unspecified, unspecified trimester: Secondary | ICD-10-CM

## 2017-10-14 DIAGNOSIS — O24419 Gestational diabetes mellitus in pregnancy, unspecified control: Secondary | ICD-10-CM

## 2017-10-14 DIAGNOSIS — O26613 Liver and biliary tract disorders in pregnancy, third trimester: Secondary | ICD-10-CM | POA: Diagnosis not present

## 2017-10-14 DIAGNOSIS — O24415 Gestational diabetes mellitus in pregnancy, controlled by oral hypoglycemic drugs: Secondary | ICD-10-CM

## 2017-10-14 DIAGNOSIS — Z331 Pregnant state, incidental: Secondary | ICD-10-CM

## 2017-10-14 DIAGNOSIS — K831 Obstruction of bile duct: Secondary | ICD-10-CM | POA: Diagnosis not present

## 2017-10-14 DIAGNOSIS — O0993 Supervision of high risk pregnancy, unspecified, third trimester: Secondary | ICD-10-CM

## 2017-10-14 DIAGNOSIS — O26643 Intrahepatic cholestasis of pregnancy, third trimester: Secondary | ICD-10-CM

## 2017-10-14 DIAGNOSIS — Z1389 Encounter for screening for other disorder: Secondary | ICD-10-CM

## 2017-10-14 DIAGNOSIS — Z3A35 35 weeks gestation of pregnancy: Secondary | ICD-10-CM

## 2017-10-14 LAB — POCT URINALYSIS DIPSTICK
Glucose, UA: NEGATIVE
Ketones, UA: NEGATIVE
Nitrite, UA: NEGATIVE
PROTEIN UA: NEGATIVE
RBC UA: NEGATIVE

## 2017-10-14 MED ORDER — URSODIOL 500 MG PO TABS: 500.0000 mg | ORAL_TABLET | Freq: Three times a day (TID) | ORAL | 0 refills | Status: DC

## 2017-10-14 NOTE — Patient Instructions (Signed)
Nothing to eat or drink after midnight Monday night  [employer], I greatly value your feedback.  If you receive a survey following your visit with [name] today, we appreciate you taking the time to fill it out.  Thanks, [name], CNM, WHNP-BC   Call the office [phone]) or go to [facility] if:  You begin to have strong, frequent contractions  Your water breaks.  Sometimes it is a big gush of fluid, sometimes it is just a trickle that keeps getting your panties wet or running down your legs  You have vaginal bleeding.  It is normal to have a small amount of spotting if your cervix was checked.   You don't feel your baby moving like normal.  If you don't, get you something to eat and drink and lay down and focus on feeling your baby move.  You should feel at least 10 movements in 2 hours.  If you don't, you should call the office or go to [place] and Birth Information The normal length of a pregnancy is 39-41 weeks. Preterm labor is when labor starts before 37 completed weeks of pregnancy. What are the risk factors for preterm labor? Preterm labor is more likely to occur in women who:  Have certain infections during pregnancy such as a bladder infection, sexually transmitted infection, or infection inside the uterus (chorioamnionitis).  Have a shorter-than-normal cervix.  Have gone into preterm labor before.  Have had surgery on their cervix.  Are younger than age 90 or older than age 70.  Are African American.  Are pregnant with twins or multiple babies (multiple gestation).  Take street drugs or smoke while pregnant.  Do not gain enough weight while pregnant.  Became pregnant shortly after having been pregnant.  What are the symptoms of preterm labor? Symptoms of preterm labor include:  Cramps similar to those that can happen during a menstrual period. The cramps may happen with diarrhea.  Pain in the abdomen or lower back.  Regular  uterine contractions that may feel like tightening of the abdomen.  A feeling of increased pressure in the pelvis.  Increased watery or bloody mucus discharge from the vagina.  Water breaking (ruptured amniotic sac).  Why is it important to recognize signs of preterm labor? It is important to recognize signs of preterm labor because babies who are born prematurely may not be fully developed. This can put them at an increased risk for:  Long-term (chronic) heart and lung problems.  Difficulty immediately after birth with regulating body systems, including blood sugar, body temperature, heart rate, and breathing rate.  Bleeding in the brain.  Cerebral palsy.  Learning difficulties.  Death.  These risks are highest for babies who are born before 10 weeks of pregnancy. How is preterm labor treated? Treatment depends on the length of your pregnancy, your condition, and the health of your baby. It may involve:  Having a stitch (suture) placed in your cervix to prevent your cervix from opening too early (cerclage).  Taking or being given medicines, such as: ? Hormone medicines. These may be given early in pregnancy to help support the pregnancy. ? Medicine to stop contractions. ? Medicines to help mature the baby's lungs. These may be prescribed if the risk of delivery is high. ? Medicines to prevent your baby from developing cerebral palsy.  If the labor happens before 34 weeks of pregnancy, you may need to stay in the hospital. What should I  do if I think I am in preterm labor? If you think that you are going into preterm labor, call your health care provider right away. How can I prevent preterm labor in future pregnancies? To increase your chance of having a full-term pregnancy:  Do not use any tobacco products, such as cigarettes, chewing tobacco, and e-cigarettes. If you need help quitting, ask your health care provider.  Do not use street drugs or medicines that have not  been prescribed to you during your pregnancy.  Talk with your health care provider before taking any herbal supplements, even if you have been taking them regularly.  Make sure you gain a healthy amount of weight during your pregnancy.  Watch for infection. If you think that you might have an infection, get it checked right away.  Make sure to tell your health care provider if you have gone into preterm labor before.  This information is not intended to replace advice given to you by your health care provider. Make sure you discuss any questions you have with your health care provider. Document Released: 07/17/2003 Document Revised: 10/07/2015 Document Reviewed: 09/17/2015 Elsevier Interactive Patient Education  2018 [employer].

## 2017-10-14 NOTE — Progress Notes (Signed)
HIGH-RISK PREGNANCY VISIT Patient name: Stacey Cook MRN [id number]  Date of birth: [birth year] Chief Complaint:   High Risk Gestation  History of Present Illness:   Stacey Cook is a 22 y.o. G46P0010 female at [redacted]w[redacted]d with an Estimated Date of Delivery: 11/17/17 being seen today for ongoing management of a high-risk pregnancy complicated by [id number] 500mg  BID, ICP-actigall 300mg  TID.  Today she reports not checking sugars QID b/c has 'a lot of family stress'. Only 1 FBS which is <90, 8 2hr pp w/ only 2 >120. Itching is getting worse again, wants to know if we can increase actigall. Contractions: Not present. Vag. Bleeding: None.  Movement: Present. denies leaking of fluid.  Review of Systems:   Pertinent items are noted in HPI Denies abnormal vaginal discharge w/ itching/odor/irritation, headaches, visual changes, shortness of breath, chest pain, abdominal pain, severe nausea/vomiting, or problems with urination or bowel movements unless otherwise stated above. Pertinent History Reviewed:  Reviewed past medical,surgical, social, obstetrical and family history.  Reviewed problem list, medications and allergies. Physical Assessment:   Vitals:   10/14/17 1039  BP: 126/72  Pulse: (!) 114  Weight: 197 lb (89.4 kg)  Body mass index is 39.79 kg/m.           Physical Examination:   General appearance: alert, well appearing, and in no distress  Mental status: alert, oriented to person, place, and time  Skin: warm & dry   Extremities: Edema: Trace    Cardiovascular: normal heart rate noted  Respiratory: normal respiratory effort, no distress  Abdomen: gravid, soft, non-tender  Pelvic: Cervical exam deferred         Fetal Status: Fetal Heart Rate (bpm): 140 Fundal Height: 36 cm Movement: Present    Fetal Surveillance Testing today: NST: FHR baseline 140 bpm, Variability: moderate, Accelerations:present, Decelerations:  Absent= Cat 1/Reactive Toco: 1 uc w/ ui     Results for orders  placed or performed in visit on 10/14/17 (from the past 24 hour(s))  POCT urinalysis dipstick   Collection Time: 10/14/17 10:40 AM  Result Value Ref Range   Color, UA     Clarity, UA     Glucose, UA Negative Negative   Bilirubin, UA     Ketones, UA neg    Spec Grav, UA  1.010 - 1.025   Blood, UA neg    pH, UA  5.0 - 8.0   Protein, UA Negative Negative   Urobilinogen, UA  0.2 or 1.0 E.U./dL   Nitrite, UA neg    Leukocytes, UA Moderate (2+) (A) Negative   Appearance     Odor      Assessment & Plan:  1) High-risk pregnancy G2P0010 at [redacted]w[redacted]d with an Estimated Date of Delivery: 11/17/17   2) A2DM, noncompliant w/ QID cbg's, out of the 9 she has checked in the last 4 days they do seem to be doing a little better. Continue metformin 500mg  BID. Check sugars QID consistently.  3) ICP, itching increasing, increase actigall to 500mg  TID. Not fasting today, will check fasting bile acids/cmp next Tues am  4) Suspected LGA> EFW 91% @ 34wks  Meds:  Meds ordered this encounter  Medications  . ursodiol (ACTIGALL) 500 MG tablet    Sig: Take 1 tablet (500 mg total) by mouth 3 (three) times daily.    Dispense:  90 tablet    Refill:  0    Order Specific Question:   Supervising Provider    Answer:   Tania Ade  H [[id number]]    Labs/procedures today: nst  Treatment Plan:  Growth q 3wks, 2x/wk nst, deliver 37wks or as clinically indicated  Reviewed: Preterm labor symptoms and general obstetric precautions including but not limited to vaginal bleeding, contractions, leaking of fluid and fetal movement were reviewed in detail with the patient.  All questions were answered.  Follow-up: Return for Tues as scheduled for hrob/nst, schedule for fasting cmp/bile acids that am please.  Orders Placed This Encounter  Procedures  . POCT urinalysis dipstick   Roma Schanz CNM, [facility] 10/14/2017 2:24 PM

## 2017-10-18 ENCOUNTER — Encounter: Payer: Self-pay | Admitting: Obstetrics & Gynecology

## 2017-10-18 ENCOUNTER — Other Ambulatory Visit: Payer: Medicaid Other

## 2017-10-18 ENCOUNTER — Encounter (HOSPITAL_COMMUNITY): Payer: Self-pay | Admitting: *Deleted

## 2017-10-18 ENCOUNTER — Telehealth (HOSPITAL_COMMUNITY): Payer: Self-pay | Admitting: *Deleted

## 2017-10-18 ENCOUNTER — Ambulatory Visit (INDEPENDENT_AMBULATORY_CARE_PROVIDER_SITE_OTHER): Payer: Medicaid Other | Admitting: Obstetrics & Gynecology

## 2017-10-18 VITALS — BP 134/78 | HR 89 | Wt 199.0 lb

## 2017-10-18 DIAGNOSIS — O26613 Liver and biliary tract disorders in pregnancy, third trimester: Principal | ICD-10-CM

## 2017-10-18 DIAGNOSIS — K831 Obstruction of bile duct: Secondary | ICD-10-CM

## 2017-10-18 DIAGNOSIS — O219 Vomiting of pregnancy, unspecified: Secondary | ICD-10-CM | POA: Diagnosis not present

## 2017-10-18 DIAGNOSIS — O0990 Supervision of high risk pregnancy, unspecified, unspecified trimester: Secondary | ICD-10-CM

## 2017-10-18 DIAGNOSIS — Z3A35 35 weeks gestation of pregnancy: Secondary | ICD-10-CM

## 2017-10-18 DIAGNOSIS — Z1389 Encounter for screening for other disorder: Secondary | ICD-10-CM

## 2017-10-18 DIAGNOSIS — O24415 Gestational diabetes mellitus in pregnancy, controlled by oral hypoglycemic drugs: Secondary | ICD-10-CM

## 2017-10-18 DIAGNOSIS — O24419 Gestational diabetes mellitus in pregnancy, unspecified control: Secondary | ICD-10-CM

## 2017-10-18 DIAGNOSIS — Z331 Pregnant state, incidental: Secondary | ICD-10-CM

## 2017-10-18 LAB — POCT URINALYSIS DIPSTICK
Glucose, UA: NEGATIVE
Ketones, UA: NEGATIVE
LEUKOCYTES UA: NEGATIVE
NITRITE UA: NEGATIVE
Protein, UA: NEGATIVE
RBC UA: NEGATIVE

## 2017-10-18 NOTE — Progress Notes (Signed)
   HIGH-RISK PREGNANCY VISIT Patient name: Stacey Cook MRN [id number]  Date of birth: 10-Jul-1995 Chief Complaint:   Routine Prenatal Visit  History of Present Illness:   Stacey Cook is a 22 y.o. G72P0010 female at [redacted]w[redacted]d with an Estimated Date of Delivery: 11/17/17 being seen today for ongoing management of a high-risk pregnancy complicated by class  A2 DM, ICP.  Today she reports N/V. Contractions: Irregular. Vag. Bleeding: None.  Movement: Present. denies leaking of fluid.  Review of Systems:   Pertinent items are noted in HPI Denies abnormal vaginal discharge w/ itching/odor/irritation, headaches, visual changes, shortness of breath, chest pain, abdominal pain, severe nausea/vomiting, or problems with urination or bowel movements unless otherwise stated above. Pertinent History Reviewed:  Reviewed past medical,surgical, social, obstetrical and family history.  Reviewed problem list, medications and allergies. Physical Assessment:   Vitals:   10/18/17 1003  BP: 134/78  Pulse: 89  Weight: 199 lb (90.3 kg)  Body mass index is 40.19 kg/m.           Physical Examination:   General appearance: alert, well appearing, and in no distress  Mental status: alert, oriented to person, place, and time  Skin: warm & dry   Extremities: Edema: Trace    Cardiovascular: normal heart rate noted  Respiratory: normal respiratory effort, no distress  Abdomen: gravid, soft, non-tender  Pelvic: Cervical exam deferred         Fetal Status: Fetal Heart Rate (bpm): 145 Fundal Height: 36 cm Movement: Present    Fetal Surveillance Testing today: Reactive NST   Results for orders placed or performed in visit on 10/18/17 (from the past 24 hour(s))  POCT Urinalysis Dipstick   Collection Time: 10/18/17 10:04 AM  Result Value Ref Range   Color, UA     Clarity, UA     Glucose, UA Negative Negative   Bilirubin, UA     Ketones, UA neg    Spec Grav, UA  1.010 - 1.025   Blood, UA neg    pH, UA  5.0 - 8.0    Protein, UA Negative Negative   Urobilinogen, UA  0.2 or 1.0 E.U./dL   Nitrite, UA neg    Leukocytes, UA Negative Negative   Appearance     Odor      Assessment & Plan:  1) High-risk pregnancy G2P0010 at [redacted]w[redacted]d with an Estimated Date of Delivery: 11/17/17   2) Class A2 DM, stable  3) ICP, unstable, Actigall 500 TID  Meds: No orders of the defined types were placed in this encounter.   Labs/procedures today: Reactive NST  Treatment Plan:  IOL 10/27/2017, NST in 3 days, sonogram next week for EFW/BPP  Reviewed: Preterm labor symptoms and general obstetric precautions including but not limited to vaginal bleeding, contractions, leaking of fluid and fetal movement were reviewed in detail with the patient.  All questions were answered.  Follow-up: Return in about 3 days (around 10/21/2017) for NST, HROB.  Orders Placed This Encounter  Procedures  . POCT Urinalysis Dipstick   [name]  10/18/2017 10:55 AM

## 2017-10-18 NOTE — Telephone Encounter (Signed)
Preadmission screen  

## 2017-10-20 LAB — COMPREHENSIVE METABOLIC PANEL
ALBUMIN: 3.3 g/dL — AB (ref 3.5–5.5)
ALK PHOS: 301 IU/L — AB (ref 39–117)
ALT: 25 IU/L (ref 0–32)
AST: 30 IU/L (ref 0–40)
Albumin/Globulin Ratio: 0.9 — ABNORMAL LOW (ref 1.2–2.2)
BILIRUBIN TOTAL: 0.2 mg/dL (ref 0.0–1.2)
BUN / CREAT RATIO: 15 (ref 9–23)
BUN: 8 mg/dL (ref 6–20)
CHLORIDE: 102 mmol/L (ref 96–106)
CO2: 19 mmol/L — ABNORMAL LOW (ref 20–29)
Calcium: 9.7 mg/dL (ref 8.7–10.2)
Creatinine, Ser: 0.52 mg/dL — ABNORMAL LOW (ref 0.57–1.00)
GFR calc Af Amer: 160 mL/min/{1.73_m2} (ref >59)
GFR calc non Af Amer: 139 mL/min/{1.73_m2} (ref >59)
GLOBULIN, TOTAL: 3.5 g/dL (ref 1.5–4.5)
GLUCOSE: 80 mg/dL (ref 65–99)
Potassium: 4.4 mmol/L (ref 3.5–5.2)
SODIUM: 136 mmol/L (ref 134–144)
Total Protein: 6.8 g/dL (ref 6.0–8.5)

## 2017-10-20 LAB — BILE ACIDS, TOTAL: BILE ACIDS TOTAL: 13.3 umol/L (ref 4.7–24.5)

## 2017-10-21 ENCOUNTER — Encounter: Payer: Self-pay | Admitting: Women's Health

## 2017-10-21 ENCOUNTER — Ambulatory Visit (INDEPENDENT_AMBULATORY_CARE_PROVIDER_SITE_OTHER): Payer: Medicaid Other | Admitting: Women's Health

## 2017-10-21 VITALS — BP 118/73 | HR 108 | Wt 200.0 lb

## 2017-10-21 DIAGNOSIS — Z3A36 36 weeks gestation of pregnancy: Secondary | ICD-10-CM | POA: Diagnosis not present

## 2017-10-21 DIAGNOSIS — Z331 Pregnant state, incidental: Secondary | ICD-10-CM

## 2017-10-21 DIAGNOSIS — O26643 Intrahepatic cholestasis of pregnancy, third trimester: Secondary | ICD-10-CM

## 2017-10-21 DIAGNOSIS — O24419 Gestational diabetes mellitus in pregnancy, unspecified control: Secondary | ICD-10-CM

## 2017-10-21 DIAGNOSIS — K831 Obstruction of bile duct: Secondary | ICD-10-CM | POA: Diagnosis not present

## 2017-10-21 DIAGNOSIS — O24415 Gestational diabetes mellitus in pregnancy, controlled by oral hypoglycemic drugs: Secondary | ICD-10-CM | POA: Diagnosis not present

## 2017-10-21 DIAGNOSIS — O26613 Liver and biliary tract disorders in pregnancy, third trimester: Secondary | ICD-10-CM | POA: Diagnosis not present

## 2017-10-21 DIAGNOSIS — O0993 Supervision of high risk pregnancy, unspecified, third trimester: Secondary | ICD-10-CM

## 2017-10-21 DIAGNOSIS — Z3483 Encounter for supervision of other normal pregnancy, third trimester: Secondary | ICD-10-CM

## 2017-10-21 DIAGNOSIS — Z1389 Encounter for screening for other disorder: Secondary | ICD-10-CM

## 2017-10-21 LAB — POCT URINALYSIS DIPSTICK
GLUCOSE UA: NEGATIVE
Ketones, UA: NEGATIVE
LEUKOCYTES UA: NEGATIVE
Nitrite, UA: NEGATIVE
Protein, UA: POSITIVE — AB
RBC UA: 3

## 2017-10-21 LAB — OB RESULTS CONSOLE GBS: STREP GROUP B AG: POSITIVE

## 2017-10-21 NOTE — Addendum Note (Signed)
Addended by: Diona Fanti A on: 10/21/2017 12:14 PM   Modules accepted: Orders

## 2017-10-21 NOTE — Patient Instructions (Signed)
Stacey Cook, I greatly value your feedback.  If you receive a survey following your visit with [name] today, we appreciate you taking the time to fill it out.  Thanks, [name], CNM, WHNP-BC   Call the office [phone]) or go to [facility] if:  You begin to have strong, frequent contractions  Your water breaks.  Sometimes it is a big gush of fluid, sometimes it is just a trickle that keeps getting your panties wet or running down your legs  You have vaginal bleeding.  It is normal to have a small amount of spotting if your cervix was checked.   You don't feel your baby moving like normal.  If you don't, get you something to eat and drink and lay down and focus on feeling your baby move.  You should feel at least 10 movements in 2 hours.  If you don't, you should call the office or go to [place] and Birth Information The normal length of a pregnancy is 39-41 weeks. Preterm labor is when labor starts before 37 completed weeks of pregnancy. What are the risk factors for preterm labor? Preterm labor is more likely to occur in women who:  Have certain infections during pregnancy such as a bladder infection, sexually transmitted infection, or infection inside the uterus (chorioamnionitis).  Have a shorter-than-normal cervix.  Have gone into preterm labor before.  Have had surgery on their cervix.  Are younger than age 63 or older than age 27.  Are African American.  Are pregnant with twins or multiple babies (multiple gestation).  Take street drugs or smoke while pregnant.  Do not gain enough weight while pregnant.  Became pregnant shortly after having been pregnant.  What are the symptoms of preterm labor? Symptoms of preterm labor include:  Cramps similar to those that can happen during a menstrual period. The cramps may happen with diarrhea.  Pain in the abdomen or lower back.  Regular uterine contractions that may feel like tightening of  the abdomen.  A feeling of increased pressure in the pelvis.  Increased watery or bloody mucus discharge from the vagina.  Water breaking (ruptured amniotic sac).  Why is it important to recognize signs of preterm labor? It is important to recognize signs of preterm labor because babies who are born prematurely may not be fully developed. This can put them at an increased risk for:  Long-term (chronic) heart and lung problems.  Difficulty immediately after birth with regulating body systems, including blood sugar, body temperature, heart rate, and breathing rate.  Bleeding in the brain.  Cerebral palsy.  Learning difficulties.  Death.  These risks are highest for babies who are born before 75 weeks of pregnancy. How is preterm labor treated? Treatment depends on the length of your pregnancy, your condition, and the health of your baby. It may involve:  Having a stitch (suture) placed in your cervix to prevent your cervix from opening too early (cerclage).  Taking or being given medicines, such as: ? Hormone medicines. These may be given early in pregnancy to help support the pregnancy. ? Medicine to stop contractions. ? Medicines to help mature the baby's lungs. These may be prescribed if the risk of delivery is high. ? Medicines to prevent your baby from developing cerebral palsy.  If the labor happens before 34 weeks of pregnancy, you may need to stay in the hospital. What should I do if I think I am in preterm labor? If  you think that you are going into preterm labor, call your health care provider right away. How can I prevent preterm labor in future pregnancies? To increase your chance of having a full-term pregnancy:  Do not use any tobacco products, such as cigarettes, chewing tobacco, and e-cigarettes. If you need help quitting, ask your health care provider.  Do not use street drugs or medicines that have not been prescribed to you during your pregnancy.  Talk  with your health care provider before taking any herbal supplements, even if you have been taking them regularly.  Make sure you gain a healthy amount of weight during your pregnancy.  Watch for infection. If you think that you might have an infection, get it checked right away.  Make sure to tell your health care provider if you have gone into preterm labor before.  This information is not intended to replace advice given to you by your health care provider. Make sure you discuss any questions you have with your health care provider. Document Released: 07/17/2003 Document Revised: 10/07/2015 Document Reviewed: 09/17/2015 Elsevier Interactive Patient Education  2018 [employer].

## 2017-10-21 NOTE — Progress Notes (Signed)
   HIGH-RISK PREGNANCY VISIT Patient name: Stacey Cook MRN [id number]  Date of birth: [birth year] Chief Complaint:   high risk ob (NST/ room #8/ gbs- gc-chl)  History of Present Illness:   Stacey Cook is a 22 y.o. G2P0010 female at [redacted]w[redacted]d with an Estimated Date of Delivery: 11/17/17 being seen today for ongoing management of a high-risk pregnancy complicated by F1MB on metformin 500mg  BID. Suspected LGA 91% @ 34wks, ICP on actigall 500mg  TID Today she reports hasn't checked any sugars since last visit, lots of family stress. . Contractions: Not present.  .  Movement: Present. denies leaking of fluid.  Review of Systems:   Pertinent items are noted in HPI Denies abnormal vaginal discharge w/ itching/odor/irritation, headaches, visual changes, shortness of breath, chest pain, abdominal pain, severe nausea/vomiting, or problems with urination or bowel movements unless otherwise stated above. Pertinent History Reviewed:  Reviewed past medical,surgical, social, obstetrical and family history.  Reviewed problem list, medications and allergies. Physical Assessment:   Vitals:   10/21/17 1026  BP: 118/73  Pulse: (!) 108  Weight: 200 lb (90.7 kg)  Body mass index is 40.4 kg/m.           Physical Examination:   General appearance: alert, well appearing, and in no distress  Mental status: alert, oriented to person, place, and time  Skin: warm & dry   Extremities: Edema: Trace    Cardiovascular: normal heart rate noted  Respiratory: normal respiratory effort, no distress  Abdomen: gravid, soft, non-tender  Pelvic: Cervical exam performed  Dilation: 3 Effacement (%): Thick Station: -2  Fetal Status: Fetal Heart Rate (bpm): 130 Fundal Height: 37 cm Movement: Present Presentation: Vertex  Fetal Surveillance Testing today: NST: FHR baseline 130 bpm, Variability: moderate, Accelerations:present, Decelerations:  Absent= Cat 1/Reactive Toco: irregular     No results found for this or any  previous visit (from the past 24 hour(s)).  Assessment & Plan:  1) High-risk pregnancy G2P0010 at [redacted]w[redacted]d with an Estimated Date of Delivery: 11/17/17   2) A2DM, on metformin 500mg  BID, non-compliant, not checking sugars d/t 'family stress', emphasized importance of checking QID sugars  3) ICP, stable, bile acids 13.3, LFTs normal 3d ago, continue actigall 500mg  TID, IOL @ 37wks  4) Suspected LGA> 91% at 34wks  Meds: No orders of the defined types were placed in this encounter.   Labs/procedures today: nst, gbs, gc/ct, sve  Treatment Plan:  NST Tues, IOL as scheduled 6/20  Reviewed: Preterm labor symptoms and general obstetric precautions including but not limited to vaginal bleeding, contractions, leaking of fluid and fetal movement were reviewed in detail with the patient.  All questions were answered.  Follow-up: Return for As scheduled Tues for HROB/NST.  Orders Placed This Encounter  Procedures  . Strep Gp B NAA  . GC/Chlamydia Probe Amp   Roma Schanz CNM, [facility] 10/21/2017 11:19 AM

## 2017-10-22 ENCOUNTER — Encounter (HOSPITAL_COMMUNITY): Payer: Self-pay

## 2017-10-22 ENCOUNTER — Inpatient Hospital Stay (HOSPITAL_COMMUNITY)
Admission: AD | Admit: 2017-10-22 | Discharge: 2017-10-22 | Disposition: A | Discharge status: Home / Self Care | Payer: Medicaid Other | Source: Ambulatory Visit | Attending: Obstetrics and Gynecology | Admitting: Obstetrics and Gynecology

## 2017-10-22 ENCOUNTER — Other Ambulatory Visit: Payer: Self-pay

## 2017-10-22 DIAGNOSIS — Z3483 Encounter for supervision of other normal pregnancy, third trimester: Secondary | ICD-10-CM | POA: Insufficient documentation

## 2017-10-22 DIAGNOSIS — O479 False labor, unspecified: Secondary | ICD-10-CM

## 2017-10-22 DIAGNOSIS — Z3A37 37 weeks gestation of pregnancy: Secondary | ICD-10-CM | POA: Diagnosis not present

## 2017-10-22 LAB — URINALYSIS, ROUTINE W REFLEX MICROSCOPIC
Bilirubin Urine: NEGATIVE
Glucose, UA: NEGATIVE mg/dL
Hgb urine dipstick: NEGATIVE
KETONES UR: NEGATIVE mg/dL
Nitrite: NEGATIVE
PROTEIN: 30 mg/dL — AB
Specific Gravity, Urine: 1.023 (ref 1.005–1.030)
pH: 6 (ref 5.0–8.0)

## 2017-10-22 NOTE — Progress Notes (Addendum)
G2P0 @ 36.[redacted] wksga. Here dt pelvic pressure and back pain ctx. Denies LOF or bleeding. +FM  EFM applied  VE: 3/50/-3    2058: Provider notified. Will return call  2102: provider returned call. Report status of pt given. Will review EFM tracing.

## 2017-10-23 LAB — STREP GP B NAA: STREP GROUP B AG: POSITIVE — AB

## 2017-10-24 ENCOUNTER — Telehealth: Payer: Self-pay | Admitting: *Deleted

## 2017-10-24 ENCOUNTER — Ambulatory Visit (INDEPENDENT_AMBULATORY_CARE_PROVIDER_SITE_OTHER): Payer: Medicaid Other | Admitting: Women's Health

## 2017-10-24 ENCOUNTER — Encounter: Payer: Self-pay | Admitting: Women's Health

## 2017-10-24 VITALS — BP 124/80 | HR 113 | Wt 202.0 lb

## 2017-10-24 DIAGNOSIS — O368130 Decreased fetal movements, third trimester, not applicable or unspecified: Secondary | ICD-10-CM

## 2017-10-24 DIAGNOSIS — K5900 Constipation, unspecified: Secondary | ICD-10-CM

## 2017-10-24 DIAGNOSIS — K831 Obstruction of bile duct: Secondary | ICD-10-CM

## 2017-10-24 DIAGNOSIS — O0993 Supervision of high risk pregnancy, unspecified, third trimester: Secondary | ICD-10-CM

## 2017-10-24 DIAGNOSIS — Z1389 Encounter for screening for other disorder: Secondary | ICD-10-CM

## 2017-10-24 DIAGNOSIS — Z3A36 36 weeks gestation of pregnancy: Secondary | ICD-10-CM

## 2017-10-24 DIAGNOSIS — O24415 Gestational diabetes mellitus in pregnancy, controlled by oral hypoglycemic drugs: Secondary | ICD-10-CM | POA: Diagnosis not present

## 2017-10-24 DIAGNOSIS — O99613 Diseases of the digestive system complicating pregnancy, third trimester: Secondary | ICD-10-CM | POA: Diagnosis not present

## 2017-10-24 DIAGNOSIS — O26613 Liver and biliary tract disorders in pregnancy, third trimester: Secondary | ICD-10-CM

## 2017-10-24 DIAGNOSIS — O24419 Gestational diabetes mellitus in pregnancy, unspecified control: Secondary | ICD-10-CM

## 2017-10-24 DIAGNOSIS — Z331 Pregnant state, incidental: Secondary | ICD-10-CM

## 2017-10-24 LAB — POCT URINALYSIS DIPSTICK
Glucose, UA: NEGATIVE
Ketones, UA: NEGATIVE
NITRITE UA: NEGATIVE
PROTEIN UA: NEGATIVE

## 2017-10-24 NOTE — Telephone Encounter (Signed)
Pt called with complaints of cramps in back and stomach. Lost mucus plug. Went to [facility] Saturday with contractions. Was told she was dilated to 3.5-4 cm. Pt states she hasn't felt any baby movement today. Has been awake since 7 am. I advised she needs to be seen. Pt to be worked in this am. Pt voiced understanding. [place]

## 2017-10-24 NOTE — Patient Instructions (Addendum)
Londell Moh Hathaway, I greatly value your feedback.  If you receive a survey following your visit with [name] today, we appreciate you taking the time to fill it out.  Thanks, [name], CNM, WHNP-BC   Call the office [phone] or go to [facility] if:  You begin to have strong, frequent contractions  Your water breaks.  Sometimes it is a big gush of fluid, sometimes it is just a trickle that keeps getting your panties wet or running down your legs  You have vaginal bleeding.  It is normal to have a small amount of spotting if your cervix was checked.   You don't feel your baby moving like normal.  If you don't, get you something to eat and drink and lay down and focus on feeling your baby move.  You should feel at least 10 movements in 2 hours.  If you don't, you should call the office or go to [facility].   Constipation  Drink plenty of fluid, preferably water, throughout the day  Eat foods high in fiber such as fruits, vegetables, and grains  Exercise, such as walking, is a good way to keep your bowels regular  Drink warm fluids, especially warm prune juice, or decaf coffee  Eat a 1/2 cup of real oatmeal (not instant), 1/2 cup applesauce, and 1/2-1 cup warm prune juice every day  If needed, you may take Colace (docusate sodium) stool softener once or twice a day to help keep the stool soft. If you are pregnant, wait until you are out of your first trimester (12-14 weeks of pregnancy)  If you still are having problems with constipation, you may take Miralax once daily as needed to help keep your bowels regular.  If you are pregnant, wait until you are out of your first trimester (12-14 weeks of pregnancy)       Braxton Hicks Contractions Contractions of the uterus can occur throughout pregnancy, but they are not always a sign that you are in labor. You may have practice contractions called Braxton Hicks contractions. These false labor contractions are sometimes confused with  true labor. What are [place] contractions? Braxton Hicks contractions are tightening movements that occur in the muscles of the uterus before labor. Unlike true labor contractions, these contractions do not result in opening (dilation) and thinning of the cervix. Toward the end of pregnancy (32-34 weeks), Braxton Hicks contractions can happen more often and may become stronger. These contractions are sometimes difficult to tell apart from true labor because they can be very uncomfortable. You should not feel embarrassed if you go to the hospital with false labor. Sometimes, the only way to tell if you are in true labor is for your health care provider to look for changes in the cervix. The health care provider will do a physical exam and may monitor your contractions. If you are not in true labor, the exam should show that your cervix is not dilating and your water has not broken. If there are other health problems associated with your pregnancy, it is completely safe for you to be sent home with false labor. You may continue to have Braxton Hicks contractions until you go into true labor. How to tell the difference between true labor and false labor True labor  Contractions last 30-70 seconds.  Contractions become very regular.  Discomfort is usually felt in the top of the uterus, and it spreads to the lower abdomen and low back.  Contractions do not go away with  walking.  Contractions usually become more intense and increase in frequency.  The cervix dilates and gets thinner. False labor  Contractions are usually shorter and not as strong as true labor contractions.  Contractions are usually irregular.  Contractions are often felt in the front of the lower abdomen and in the groin.  Contractions may go away when you walk around or change positions while lying down.  Contractions get weaker and are shorter-lasting as time goes on.  The cervix usually does not dilate or become  thin. Follow these instructions at home:  Take over-the-counter and prescription medicines only as told by your health care provider.  Keep up with your usual exercises and follow other instructions from your health care provider.  Eat and drink lightly if you think you are going into labor.  If Braxton Hicks contractions are making you uncomfortable: ? Change your position from lying down or resting to walking, or change from walking to resting. ? Sit and rest in a tub of warm water. ? Drink enough fluid to keep your urine pale yellow. Dehydration may cause these contractions. ? Do slow and deep breathing several times an hour.  Keep all follow-up prenatal visits as told by your health care provider. This is important. Contact a health care provider if:  You have a fever.  You have continuous pain in your abdomen. Get help right away if:  Your contractions become stronger, more regular, and closer together.  You have fluid leaking or gushing from your vagina.  You pass blood-tinged mucus (bloody show).  You have bleeding from your vagina.  You have low back pain that you never had before.  You feel your baby's head pushing down and causing pelvic pressure.  Your baby is not moving inside you as much as it used to. Summary  Contractions that occur before labor are called Braxton Hicks contractions, false labor, or practice contractions.  Braxton Hicks contractions are usually shorter, weaker, farther apart, and less regular than true labor contractions. True labor contractions usually become progressively stronger and regular and they become more frequent.  Manage discomfort from [facility] contractions by changing position, resting in a warm bath, drinking plenty of water, or practicing deep breathing. This information is not intended to replace advice given to you by your health care provider. Make sure you discuss any questions you have with your health care  provider. Document Released: 09/09/2016 Document Revised: 09/09/2016 Document Reviewed: 09/09/2016 Elsevier Interactive Patient Education  2018 [employer].

## 2017-10-24 NOTE — Progress Notes (Signed)
Work-in HIGH-RISK PREGNANCY VISIT Patient name: Stacey Cook MRN [id number]  Date of birth: 05-Nov-1995 Chief Complaint:   work in ob (decreased baby movement; losing mucus plug; pain in top of stomach; back pain  )  History of Present Illness:   Stacey Cook is a 22 y.o. G39P0010 female at [redacted]w[redacted]d with an Estimated Date of Delivery: 11/17/17 being seen today for ongoing management of a high-risk pregnancy complicated by C6CB on metformin 500mg  BID, suspected LGA 91% @ 34wks, ICP on actigall 500mg  TID.  Today she is being seen as a work-in for  report of decreased fm this am- normal since on efm, contractions, losing mucous plug. Reports all sugars wnl, didn't bring log, this visit was unexpected. Constipated, hasn't tried anything.  Contractions: Irregular. Vag. Bleeding: None.  Movement: (!) Decreased. denies leaking of fluid.  Review of Systems:   Pertinent items are noted in HPI Denies abnormal vaginal discharge w/ itching/odor/irritation, headaches, visual changes, shortness of breath, chest pain, abdominal pain, severe nausea/vomiting, or problems with urination or bowel movements unless otherwise stated above. Pertinent History Reviewed:  Reviewed past medical,surgical, social, obstetrical and family history.  Reviewed problem list, medications and allergies. Physical Assessment:   Vitals:   10/24/17 1204  BP: 124/80  Pulse: (!) 113  Weight: 202 lb (91.6 kg)  Body mass index is 40.8 kg/m.           Physical Examination:   General appearance: alert, well appearing, and in no distress  Mental status: alert, oriented to person, place, and time  Skin: warm & dry   Extremities: Edema: Trace    Cardiovascular: normal heart rate noted  Respiratory: normal respiratory effort, no distress  Abdomen: gravid, soft, non-tender  Pelvic: Cervical exam performed  Dilation: 3.5 Effacement (%): 80 Station: -2  Fetal Status: Fetal Heart Rate (bpm): 140 Fundal Height: 37 cm Movement: (!)  Decreased Presentation: Vertex  Fetal Surveillance Testing today: NST: FHR baseline 140 bpm, Variability: moderate, Accelerations:present, Decelerations:  Absent= Cat 1/Reactive Toco: none     Results for orders placed or performed in visit on 10/24/17 (from the past 24 hour(s))  POCT urinalysis dipstick   Collection Time: 10/24/17 12:05 PM  Result Value Ref Range   Color, UA     Clarity, UA     Glucose, UA Negative Negative   Bilirubin, UA     Ketones, UA neg    Spec Grav, UA  1.010 - 1.025   Blood, UA trace    pH, UA  5.0 - 8.0   Protein, UA Negative Negative   Urobilinogen, UA  0.2 or 1.0 E.U./dL   Nitrite, UA neg    Leukocytes, UA Small (1+) (A) Negative   Appearance     Odor      Assessment & Plan:  1) High-risk pregnancy G2P0010 at [redacted]w[redacted]d with an Estimated Date of Delivery: 11/17/17   2) ICP, actigall 500mg  TID  3) A2DM, metformin 500mg  BID  4) Suspected LGA  5) Decreased fm> resolved, reactive NST  6) Constipation> gave printed prevention/relief measures   Meds: No orders of the defined types were placed in this encounter.   Labs/procedures today: nst, sve  Treatment Plan:  IOL Thursday am as scheduled  Reviewed: Preterm labor symptoms and general obstetric precautions including but not limited to vaginal bleeding, contractions, leaking of fluid and fetal movement were reviewed in detail with the patient.  All questions were answered.  Follow-up: Return for cancel tomorrow's appt, f/u 6wks for  pp visit.  Orders Placed This Encounter  Procedures  . POCT urinalysis dipstick   Roma Schanz CNM, [facility] 10/24/2017 12:44 PM

## 2017-10-25 ENCOUNTER — Other Ambulatory Visit: Payer: Medicaid Other | Admitting: Obstetrics & Gynecology

## 2017-10-25 LAB — GC/CHLAMYDIA PROBE AMP
Chlamydia trachomatis, NAA: NEGATIVE
Neisseria gonorrhoeae by PCR: NEGATIVE

## 2017-10-27 ENCOUNTER — Inpatient Hospital Stay (HOSPITAL_COMMUNITY): Payer: Medicaid Other | Admitting: Anesthesiology

## 2017-10-27 ENCOUNTER — Inpatient Hospital Stay (HOSPITAL_COMMUNITY)
Admission: RE | Admit: 2017-10-27 | Discharge: 2017-10-30 | DRG: 805 | Disposition: A | Discharge status: Home / Self Care | Payer: Medicaid Other | Attending: Obstetrics and Gynecology | Admitting: Obstetrics and Gynecology

## 2017-10-27 ENCOUNTER — Encounter (HOSPITAL_COMMUNITY): Payer: Self-pay

## 2017-10-27 DIAGNOSIS — O9081 Anemia of the puerperium: Secondary | ICD-10-CM | POA: Diagnosis not present

## 2017-10-27 DIAGNOSIS — K831 Obstruction of bile duct: Secondary | ICD-10-CM | POA: Diagnosis present

## 2017-10-27 DIAGNOSIS — Z87891 Personal history of nicotine dependence: Secondary | ICD-10-CM | POA: Diagnosis not present

## 2017-10-27 DIAGNOSIS — O2662 Liver and biliary tract disorders in childbirth: Secondary | ICD-10-CM | POA: Diagnosis present

## 2017-10-27 DIAGNOSIS — Z3A37 37 weeks gestation of pregnancy: Secondary | ICD-10-CM

## 2017-10-27 DIAGNOSIS — J4520 Mild intermittent asthma, uncomplicated: Secondary | ICD-10-CM | POA: Diagnosis present

## 2017-10-27 DIAGNOSIS — K219 Gastro-esophageal reflux disease without esophagitis: Secondary | ICD-10-CM | POA: Diagnosis present

## 2017-10-27 DIAGNOSIS — O99214 Obesity complicating childbirth: Secondary | ICD-10-CM | POA: Diagnosis present

## 2017-10-27 DIAGNOSIS — D62 Acute posthemorrhagic anemia: Secondary | ICD-10-CM | POA: Diagnosis not present

## 2017-10-27 DIAGNOSIS — O26619 Liver and biliary tract disorders in pregnancy, unspecified trimester: Secondary | ICD-10-CM

## 2017-10-27 DIAGNOSIS — O99824 Streptococcus B carrier state complicating childbirth: Secondary | ICD-10-CM | POA: Diagnosis present

## 2017-10-27 DIAGNOSIS — O9952 Diseases of the respiratory system complicating childbirth: Secondary | ICD-10-CM | POA: Diagnosis present

## 2017-10-27 DIAGNOSIS — O9962 Diseases of the digestive system complicating childbirth: Secondary | ICD-10-CM | POA: Diagnosis present

## 2017-10-27 DIAGNOSIS — O24425 Gestational diabetes mellitus in childbirth, controlled by oral hypoglycemic drugs: Secondary | ICD-10-CM | POA: Diagnosis present

## 2017-10-27 DIAGNOSIS — O26613 Liver and biliary tract disorders in pregnancy, third trimester: Secondary | ICD-10-CM | POA: Diagnosis present

## 2017-10-27 LAB — RPR: RPR Ser Ql: NONREACTIVE

## 2017-10-27 LAB — GLUCOSE, CAPILLARY
GLUCOSE-CAPILLARY: 103 mg/dL — AB (ref 65–99)
GLUCOSE-CAPILLARY: 89 mg/dL (ref 65–99)
Glucose-Capillary: 73 mg/dL (ref 65–99)
Glucose-Capillary: 90 mg/dL (ref 65–99)

## 2017-10-27 LAB — TYPE AND SCREEN
ABO/RH(D): A POS
Antibody Screen: NEGATIVE

## 2017-10-27 LAB — CBC
HEMATOCRIT: 30.7 % — AB (ref 36.0–46.0)
Hemoglobin: 9.7 g/dL — ABNORMAL LOW (ref 12.0–15.0)
MCH: 25.5 pg — AB (ref 26.0–34.0)
MCHC: 31.6 g/dL (ref 30.0–36.0)
MCV: 80.8 fL (ref 78.0–100.0)
PLATELETS: 240 10*3/uL (ref 150–400)
RBC: 3.8 MIL/uL — ABNORMAL LOW (ref 3.87–5.11)
RDW: 15.9 % — AB (ref 11.5–15.5)
WBC: 10.7 10*3/uL — AB (ref 4.0–10.5)

## 2017-10-27 MED ORDER — PENICILLIN G POT IN DEXTROSE 60000 UNIT/ML IV SOLN
3.0000 10*6.[IU] | INTRAVENOUS | Status: DC
  Administered 2017-10-27 (×3): 3 10*6.[IU] via INTRAVENOUS
  Filled 2017-10-27 (×7): qty 50

## 2017-10-27 MED ORDER — OXYTOCIN BOLUS FROM INFUSION
500.0000 mL | Freq: Once | INTRAVENOUS | Status: AC
  Administered 2017-10-28: 500 mL via INTRAVENOUS

## 2017-10-27 MED ORDER — LIDOCAINE HCL (PF) 1 % IJ SOLN
INTRAMUSCULAR | Status: DC | PRN
  Administered 2017-10-27: 2 mL via EPIDURAL
  Administered 2017-10-27: 3 mL via EPIDURAL
  Administered 2017-10-27: 5 mL via EPIDURAL

## 2017-10-27 MED ORDER — OXYTOCIN 40 UNITS IN LACTATED RINGERS INFUSION - SIMPLE MED
1.0000 m[IU]/min | INTRAVENOUS | Status: DC
  Administered 2017-10-27: 2 m[IU]/min via INTRAVENOUS
  Filled 2017-10-27: qty 1000

## 2017-10-27 MED ORDER — TERBUTALINE SULFATE 1 MG/ML IJ SOLN: 0.2500 mg | Freq: Once | INTRAMUSCULAR | Status: DC | PRN

## 2017-10-27 MED ORDER — ACETAMINOPHEN 325 MG PO TABS: 650.0000 mg | ORAL_TABLET | ORAL | Status: DC | PRN

## 2017-10-27 MED ORDER — LACTATED RINGERS IV SOLN
INTRAVENOUS | Status: DC
  Administered 2017-10-27 (×2): via INTRAVENOUS

## 2017-10-27 MED ORDER — EPHEDRINE 5 MG/ML INJ
10.0000 mg | INTRAVENOUS | Status: DC | PRN
  Filled 2017-10-27: qty 2

## 2017-10-27 MED ORDER — SOD CITRATE-CITRIC ACID 500-334 MG/5ML PO SOLN: 30.0000 mL | ORAL | Status: DC | PRN

## 2017-10-27 MED ORDER — OXYTOCIN 40 UNITS IN LACTATED RINGERS INFUSION - SIMPLE MED
2.5000 [IU]/h | INTRAVENOUS | Status: DC
  Filled 2017-10-27: qty 1000

## 2017-10-27 MED ORDER — ONDANSETRON HCL 4 MG/2ML IJ SOLN
4.0000 mg | Freq: Four times a day (QID) | INTRAMUSCULAR | Status: DC | PRN
  Administered 2017-10-27 – 2017-10-28 (×2): 4 mg via INTRAVENOUS
  Filled 2017-10-27 (×2): qty 2

## 2017-10-27 MED ORDER — PHENYLEPHRINE 40 MCG/ML (10ML) SYRINGE FOR IV PUSH (FOR BLOOD PRESSURE SUPPORT)
80.0000 ug | PREFILLED_SYRINGE | INTRAVENOUS | Status: DC | PRN
  Filled 2017-10-27: qty 5

## 2017-10-27 MED ORDER — PHENYLEPHRINE 40 MCG/ML (10ML) SYRINGE FOR IV PUSH (FOR BLOOD PRESSURE SUPPORT)
80.0000 ug | PREFILLED_SYRINGE | INTRAVENOUS | Status: DC | PRN
  Filled 2017-10-27: qty 10
  Filled 2017-10-27: qty 5

## 2017-10-27 MED ORDER — OXYCODONE-ACETAMINOPHEN 5-325 MG PO TABS: 2.0000 | ORAL_TABLET | ORAL | Status: DC | PRN

## 2017-10-27 MED ORDER — LIDOCAINE HCL (PF) 1 % IJ SOLN
30.0000 mL | INTRAMUSCULAR | Status: DC | PRN
  Filled 2017-10-27: qty 30

## 2017-10-27 MED ORDER — FLEET ENEMA 7-19 GM/118ML RE ENEM: 1.0000 | ENEMA | RECTAL | Status: DC | PRN

## 2017-10-27 MED ORDER — MISOPROSTOL 25 MCG QUARTER TABLET
25.0000 ug | ORAL_TABLET | ORAL | Status: DC | PRN
  Filled 2017-10-27: qty 1

## 2017-10-27 MED ORDER — DIPHENHYDRAMINE HCL 50 MG/ML IJ SOLN
12.5000 mg | INTRAMUSCULAR | Status: DC | PRN
  Administered 2017-10-27: 12.5 mg via INTRAVENOUS
  Filled 2017-10-27: qty 1

## 2017-10-27 MED ORDER — OXYCODONE-ACETAMINOPHEN 5-325 MG PO TABS: 1.0000 | ORAL_TABLET | ORAL | Status: DC | PRN

## 2017-10-27 MED ORDER — SODIUM CHLORIDE 0.9 % IV SOLN
5.0000 10*6.[IU] | Freq: Once | INTRAVENOUS | Status: AC
  Administered 2017-10-27: 5 10*6.[IU] via INTRAVENOUS
  Filled 2017-10-27: qty 5

## 2017-10-27 MED ORDER — LACTATED RINGERS IV SOLN: 500.0000 mL | Freq: Once | INTRAVENOUS | Status: AC

## 2017-10-27 MED ORDER — LACTATED RINGERS IV SOLN
500.0000 mL | Freq: Once | INTRAVENOUS | Status: AC
  Administered 2017-10-27: 1000 mL via INTRAVENOUS

## 2017-10-27 MED ORDER — FENTANYL CITRATE (PF) 100 MCG/2ML IJ SOLN
100.0000 ug | INTRAMUSCULAR | Status: DC | PRN
  Administered 2017-10-27 – 2017-10-28 (×2): 100 ug via INTRAVENOUS
  Filled 2017-10-27 (×2): qty 2

## 2017-10-27 MED ORDER — LACTATED RINGERS IV SOLN: 500.0000 mL | INTRAVENOUS | Status: DC | PRN

## 2017-10-27 MED ORDER — FENTANYL 2.5 MCG/ML BUPIVACAINE 1/10 % EPIDURAL INFUSION (WH - ANES)
14.0000 mL/h | INTRAMUSCULAR | Status: DC | PRN
  Administered 2017-10-27: 14 mL/h via EPIDURAL
  Administered 2017-10-27: 12 mL/h via EPIDURAL
  Filled 2017-10-27 (×2): qty 100

## 2017-10-27 NOTE — Progress Notes (Signed)
Subjective: Stacey Cook is a 22 y.o. G2P0010 at [redacted]w[redacted]d by LMP admitted for induction of labor due to cholestasis.  Objective: BP 129/71   Pulse (!) 112   Temp 98.5 F (36.9 C) (Axillary)   Resp 20   Ht 4\' 11"  (1.499 m)   Wt 220 lb 1.6 oz (99.8 kg)   LMP 02/10/2017   SpO2 98%   BMI 44.45 kg/m  I/O last 3 completed shifts: In: -  Out: 150 [Urine:150]  FHT:  FHR: 140 bpm, variability: minimal ,  accelerations:  Present,  decelerations:  Absent UC:   regular, every 1.5-2 minutes SVE:   Dilation: Lip/rim Effacement (%): 100 Station: -1, 0 Exam by:: Laury Deep, CNM Unable to reduce ant lip behind fetal head with exam Pitocin @ 16 mU for several hours  Labs: Lab Results  Component Value Date   WBC 10.7 (H) 10/27/2017   HGB 9.7 (L) 10/27/2017   HCT 30.7 (L) 10/27/2017   MCV 80.8 10/27/2017   PLT 240 10/27/2017    Assessment / Plan: Induction of labor due to cholestasis,  progressing well on pitocin  Labor: Progressing normally on pitocin Preeclampsia:  n/a Fetal Wellbeing:  Category I Pain Control:  Epidural I/D:  n/a Anticipated MOD:  NSVD  Laury Deep, CNM 10/27/2017, 11:57 PM

## 2017-10-27 NOTE — Progress Notes (Signed)
Subjective: Stacey Cook is a 22 y.o. G2P0010 at [redacted]w[redacted]d by ultrasound admitted for induction of labor due to cholestasis & GDMA2.  Objective: BP 120/73   Pulse 88   Temp 97.9 F (36.6 C) (Oral)   Resp 18   Ht 4\' 11"  (1.499 m)   Wt 220 lb 1.6 oz (99.8 kg)   LMP 02/10/2017   BMI 44.45 kg/m    FHT:  FHR: 135 bpm, variability: moderate,  accelerations:  Present,  decelerations:  Present variables UC:   regular, every 1.5-3 minutes SVE:   Dilation: 4 Effacement (%): 70 Station: -2 Exam by: Curly [place], RN  Labs: Lab Results  Component Value Date   WBC 10.7 (H) 10/27/2017   HGB 9.7 (L) 10/27/2017   HCT 30.7 (L) 10/27/2017   MCV 80.8 10/27/2017   PLT 240 10/27/2017    Assessment / Plan: Induction of labor due to cholestasis,  progressing well on pitocin  Labor: Progressing normally Preeclampsia:  n/a Fetal Wellbeing:  Category I Pain Control:  Labor support without medications I/D:  n/a Anticipated MOD:  NSVD  Laury Deep, CNM 10/27/2017, 1:55 PM

## 2017-10-27 NOTE — Progress Notes (Signed)
No risk, no thoughts or behaviors

## 2017-10-27 NOTE — Anesthesia Procedure Notes (Signed)
Epidural Patient location during procedure: OB Start time: 10/27/2017 3:48 PM End time: 10/27/2017 3:55 PM  Staffing Anesthesiologist: Catalina Gravel, MD Performed: anesthesiologist   Preanesthetic Checklist Completed: patient identified, pre-op evaluation, timeout performed, IV checked, risks and benefits discussed and monitors and equipment checked  Epidural Patient position: sitting Prep: DuraPrep Patient monitoring: blood pressure and continuous pulse ox Approach: midline Location: L3-L4 Injection technique: LOR air  Needle:  Needle type: Tuohy  Needle gauge: 17 G Needle length: 9 cm Needle insertion depth: 6 cm Catheter size: 19 Gauge Catheter at skin depth: 11 cm Test dose: negative and Other (1% Lidocaine)  Additional Notes Patient identified.  Risk benefits discussed including failed block, incomplete pain control, headache, nerve damage, paralysis, blood pressure changes, nausea, vomiting, reactions to medication both toxic or allergic, and postpartum back pain.  Patient expressed understanding and wished to proceed.  All questions were answered.  Sterile technique used throughout procedure and epidural site dressed with sterile barrier dressing. No paresthesia or other complications noted. The patient did not experience any signs of intravascular injection such as tinnitus or metallic taste in mouth nor signs of intrathecal spread such as rapid motor block. Please see nursing notes for vital signs. Reason for block:procedure for pain

## 2017-10-27 NOTE — H&P (Signed)
Obstetric History and Physical  Stacey Cook is a 22 y.o. G2P0010 with IUP at [redacted]w[redacted]d presenting for IOL for cholestasis   Prenatal Course Source of Care: FT  with onset of care at 11 weeks Pregnancy complications or risks: Patient Active Problem List   Diagnosis Date Noted  . Intrahepatic cholestasis of pregnancy 10/27/2017  . Cholestasis of pregnancy 09/21/2017  . Gestational diabetes mellitus, class A2 08/24/2017  . Chest pain due to GERD 08/05/2017  . Depression with anxiety 06/13/2017  . Supervision of high risk pregnancy, antepartum 04/28/2017  . History of miscarriage 01/18/2017  . Conductive hearing loss, unilat, unrestrict hearing contralateral side 12/10/2015  . Severe episode of recurrent major depressive disorder, without psychotic features ([place])   . High-risk sexual behavior   . 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 Asthma, mild intermittent 02/18/2015  . 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 Perforation of left tympanic membrane 06/19/2014  . Cervicalgia 12/20/2013  . Abdominal pain, chronic, epigastric 08/08/2013  . Acne 08/22/2012  . Syncope 08/16/2012  . GAD (generalized anxiety disorder) 08/01/2012  . ADHD (attention deficit hyperactivity disorder), combined type 08/01/2012  . ODD (oppositional defiant disorder) 08/01/2012  . Vasovagal syncope 02/28/2012  . Cholesteatoma of attic of right ear 02/15/2011   She plans to bottle feed She desires oral contraceptives (estrogen/progesterone) for postpartum contraception.   Prenatal labs and studies: ABO, Rh: --/--/A POS (06/20 0737) Antibody: NEG (06/20 0737) Rubella: <0.90 (12/20 1445) RPR: Non Reactive (06/20 0737)  HBsAg: Negative (12/20 1445)  HIV: Non Reactive (04/11 0848)  [id number]:[id number] (06/14 1200) 2 hr GTT:  95/152/124 Genetic screening normal Anatomy US normal  Medical  History:  Past Medical History:  Diagnosis Date  . Abdominal pain   . ADD (attention deficit disorder)   . Anginal pain ([place])   . Anxiety   . Arthritis   . Asthma   . 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 Cervicalgia   . Cholestasis during pregnancy   . Chronic abdominal pain   . Complication of anesthesia   . Constipation   . Depression    not on meds  . Ear mass   . Gestational diabetes    pt states not been checking sugars regularly at home; nor has she been taking her Metformin  . Gestational diabetes   . Headache   . HSV infection   . Low iron   . PONV (postoperative nausea and vomiting)   . Suicidal intent   . UTI (lower urinary tract infection) 05/2014  . Vomiting     Past Surgical History:  Procedure Laterality Date  . CHOLESTEATOMA EXCISION    . DILATION AND EVACUATION N/A 09/11/2016   Procedure: DILATATION AND CURRATAGE  2ND TRIMESTER;  Surgeon: Jonnie Kind, MD;  Location: [facility];  Service: Gynecology;  Laterality: N/A;  . IMPLANTATION BONE ANCHORED HEARING AID Right 04/2013  . 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 TYMPANOPLASTY Left   . TYMPANOSTOMY      OB History  Gravida Para Term Preterm AB Living  2 0 0 0 1 0  SAB TAB Ectopic Multiple Live Births  1 0 0 0 0    # Outcome Date GA Lbr Len/2nd Weight Sex Delivery Anes PTL Lv  2 Current           1  SAB 09/11/16 [redacted]w[redacted]d           Social History   Socioeconomic History  . Marital status: Married    Spouse name: Not on file  . Number of children: Not on file  . Years of education: Not on file  . Highest education level: Not on file  Occupational History  . Not on file  Social Needs  . Financial resource strain: Not on file  . Food insecurity:    Worry: Not on file    Inability: Not on file  . Transportation needs:    Medical: Not on file    Non-medical: Not on file  Tobacco Use  . Smoking status: Former Smoker    Packs/day: 0.00     Years: 1.00    Pack years: 0.00    Types: Cigarettes    Last attempt to quit: 03/11/2017    Years since quitting: 0.6  . Smokeless tobacco: Never Used  . Tobacco comment: smokes 10 cig daily  Substance and Sexual Activity  . Alcohol use: No    Alcohol/week: 0.0 oz  . Drug use: No  . Sexual activity: Yes    Birth control/protection: None  Lifestyle  . Physical activity:    Days per week: Not on file    Minutes per session: Not on file  . Stress: Not on file  Relationships  . Social connections:    Talks on phone: Not on file    Gets together: Not on file    Attends religious service: Not on file    Active member of club or organization: Not on file    Attends meetings of clubs or organizations: Not on file    Relationship status: Not on file  Other Topics Concern  . Not on file  Social History Narrative   [employer]   Is smoker herself             Family History  Problem Relation Age of Onset  . Cholelithiasis Mother   . Kidney disease Mother        stones  . Depression Mother   . Hypertension Maternal Grandmother   . Diabetes Maternal Grandmother   . Stroke Maternal Grandmother   . Seizures Maternal Grandmother   . Asthma Maternal Grandmother   . Hyperlipidemia Maternal Grandmother   . Thyroid disease Maternal Grandmother   . Asthma Brother   . Seizures Maternal Uncle   . Cancer Other        breast- great aunt  . Celiac disease Neg Hx     Medications Prior to Admission  Medication Sig Dispense Refill Last Dose  . acetaminophen (TYLENOL) 500 MG tablet Take 500 mg by mouth every 6 (six) hours as needed for moderate pain.    Past Week at Unknown time  . cyclobenzaprine (FLEXERIL) 10 MG tablet Take 10 mg by mouth 3 (three) times daily as needed for muscle spasms.   Past Week at Unknown time  . metFORMIN (GLUCOPHAGE) 500 MG tablet Take 1 tablet (500 mg total) by mouth 2 (two) times daily with a meal. 60 tablet 3 10/26/2017 at Unknown time  . omeprazole  (PRILOSEC) 20 MG capsule Take 1 capsule (20 mg total) by mouth daily. 30 capsule 3 10/27/2017 at Unknown time  . Pediatric Multiple Vit-C-FA (FLINSTONES GUMMIES OMEGA-3 DHA PO) Take by mouth. Takes 2 daily   Past Week at Unknown time  . ursodiol (ACTIGALL) 500 MG tablet Take 1 tablet (500 mg total) by mouth 3 (  three) times daily. 90 tablet 0 10/26/2017 at Unknown time  . albuterol (PROVENTIL) (2.5 MG/3ML) 0.083% nebulizer solution Take 3 mLs (2.5 mg total) by nebulization every 6 (six) hours as needed for wheezing or shortness of breath. 75 mL 12 Taking    Allergies  Allergen Reactions  . Keflex [Cephalexin] Other (See Comments)    Reaction:  Elevated liver enzymes   . Other Other (See Comments)    Pt is allergic to surgical glue; Cats-congestion, sneezing, eyes watery, itching all over Reaction:  Infection   . Adhesive [Tape] Rash    Review of Systems: Negative except for what is mentioned in HPI.  Physical Exam: BP 125/89   Pulse 91   Temp 97.7 F (36.5 C) (Oral)   Resp 18   Ht 4\' 11"  (1.499 m)   Wt 220 lb 1.6 oz (99.8 kg)   LMP 02/10/2017   SpO2 98%   BMI 44.45 kg/m  CONSTITUTIONAL: Well-developed, well-nourished female in no acute distress.  HENT:  Normocephalic, atraumatic, External right and left ear normal. Oropharynx is clear and moist EYES: Conjunctivae and EOM are normal. Pupils are equal, round, and reactive to light. No scleral icterus.  NECK: Normal range of motion, supple, no masses SKIN: Skin is warm and dry. No rash noted. Not diaphoretic. No erythema. No pallor. NEUROLOGIC: Alert and oriented to person, place, and time. Normal reflexes, muscle tone coordination. No cranial nerve deficit noted. PSYCHIATRIC: Normal mood and affect. Normal behavior. Normal judgment and thought content. CARDIOVASCULAR: Normal heart rate noted, regular rhythm RESPIRATORY: Effort and breath sounds normal, no problems with respiration noted ABDOMEN: Soft, nontender, nondistended,  gravid. MUSCULOSKELETAL: Normal range of motion. No edema and no tenderness. 2+ distal pulses.  Cervical Exam: Dilatation 4cm   Effacement 70%   Station -2   Presentation: cephalic FHT:  Baseline rate 140 bpm   Variability moderate  Accelerations present   Decelerations variable Contractions: irregularly   Pertinent Labs/Studies:   Results for orders placed or performed during the hospital encounter of 10/27/17 (from the past 24 hour(s))  CBC     Status: Abnormal   Collection Time: 10/27/17  7:37 AM  Result Value Ref Range   WBC 10.7 (H) 4.0 - 10.5 K/uL   RBC 3.80 (L) 3.87 - 5.11 MIL/uL   Hemoglobin 9.7 (L) 12.0 - 15.0 g/dL   HCT 30.7 (L) 36.0 - 46.0 %   MCV 80.8 78.0 - 100.0 fL   MCH 25.5 (L) 26.0 - 34.0 pg   MCHC 31.6 30.0 - 36.0 g/dL   RDW 15.9 (H) 11.5 - 15.5 %   Platelets 240 150 - 400 K/uL  RPR     Status: None   Collection Time: 10/27/17  7:37 AM  Result Value Ref Range   RPR Ser Ql Non Reactive Non Reactive  Type and screen [place]     Status: None   Collection Time: 10/27/17  7:37 AM  Result Value Ref Range   ABO/RH(D) A POS    Antibody Screen NEG    Sample Expiration      10/30/2017 Performed at [facility], [address], [place], [place] [address]   Glucose, capillary     Status: Abnormal   Collection Time: 10/27/17  8:40 AM  Result Value Ref Range   Glucose-Capillary 103 (H) 65 - 99 mg/dL   Comment 1 Document in Chart    Comment 2 Call MD NNP PA CNM   Glucose, capillary  Status: None   Collection Time: 10/27/17 12:46 PM  Result Value Ref Range   Glucose-Capillary 73 65 - 99 mg/dL   Comment 1 Document in Chart    Comment 2 Call MD NNP PA CNM   Glucose, capillary     Status: None   Collection Time: 10/27/17  4:52 PM  Result Value Ref Range   Glucose-Capillary 89 65 - 99 mg/dL   Comment 1 Document in Chart    Comment 2 Call MD NNP PA CNM     Assessment : JOSPHINE LAFFEY is a 22 y.o. G2P0010 at [redacted]w[redacted]d being admitted for  induction of labor due to cholestasis  Plan:  Labor  - Admit to labor and delivery - Induction/Augmentation as ordered as per protocol - Analgesia as needed  FWB  - Cat I fetal heart tracing   - GBS positive  Cholestasis - benadryl prn for itching  gDMA2 - FS Q 4 - diabetic diet - EFW 2802 gms, 91%tile at [redacted]w[redacted]d  Delivery plan - Hopeful for vaginal delivery    K. Arvilla Meres, M.D. Attending [place] for [employer], [facility]  10/27/17

## 2017-10-27 NOTE — Anesthesia Preprocedure Evaluation (Signed)
Anesthesia Evaluation  Patient identified by MRN, date of birth, ID band Patient awake    Reviewed: Allergy & Precautions, NPO status , Patient's Chart, lab work & pertinent test results  History of Anesthesia Complications (+) PONV and history of anesthetic complications  Airway Mallampati: II  TM Distance: >3 FB Neck ROM: Full    Dental  (+) Teeth Intact, Dental Advisory Given   Pulmonary asthma , former smoker,    Pulmonary exam normal breath sounds clear to auscultation       Cardiovascular negative cardio ROS Normal cardiovascular exam Rhythm:Regular Rate:Normal     Neuro/Psych  Headaches, PSYCHIATRIC DISORDERS Anxiety Depression SI    GI/Hepatic GERD  Medicated,Cholestasis during pregnancy   Endo/Other  diabetes, Gestational, Oral Hypoglycemic AgentsMorbid obesity  Renal/GU negative Renal ROS     Musculoskeletal  (+) Arthritis ,   Abdominal   Peds  (+) ATTENTION DEFICIT DISORDER WITHOUT HYPERACTIVITY Hematology  (+) Blood dyscrasia, anemia , Plt 240k   Anesthesia Other Findings Day of surgery medications reviewed with the patient.  Reproductive/Obstetrics (+) Pregnancy                             Anesthesia Physical Anesthesia Plan  ASA: III  Anesthesia Plan: Epidural   Post-op Pain Management:    Induction:   PONV Risk Score and Plan: 3 and Treatment may vary due to age or medical condition  Airway Management Planned:   Additional Equipment:   Intra-op Plan:   Post-operative Plan:   Informed Consent: I have reviewed the patients History and Physical, chart, labs and discussed the procedure including the risks, benefits and alternatives for the proposed anesthesia with the patient or authorized representative who has indicated his/her understanding and acceptance.   Dental advisory given  Plan Discussed with:   Anesthesia Plan Comments: (Patient identified.  Risks/Benefits/Options discussed with patient including but not limited to bleeding, infection, nerve damage, paralysis, failed block, incomplete pain control, headache, blood pressure changes, nausea, vomiting, reactions to medication both or allergic, itching and postpartum back pain. Confirmed with bedside nurse the patient's most recent platelet count. Confirmed with patient that they are not currently taking any anticoagulation, have any bleeding history or any family history of bleeding disorders. Patient expressed understanding and wished to proceed. All questions were answered. )        Anesthesia Quick Evaluation

## 2017-10-27 NOTE — Anesthesia Pain Management Evaluation Note (Signed)
  CRNA Pain Management Visit Note  Patient: Stacey Cook, 22 y.o., female  "Hello I am a member of the anesthesia team at [facility]. We have an anesthesia team available at all times to provide care throughout the hospital, including epidural management and anesthesia for C-section. I don't know your plan for the delivery whether it a natural birth, water birth, IV sedation, nitrous supplementation, doula or epidural, but we want to meet your pain goals."   1.Was your pain managed to your expectations on prior hospitalizations?   Yes   2.What is your expectation for pain management during this hospitalization?     IV pain meds  3.How can we help you reach that goal? Be available if needed  Record the patient's initial score and the patient's pain goal.   Pain: 0  Pain Goal: 4 The [facility] wants you to be able to say your pain was always managed very well.  Shemuel Harkleroad 10/27/2017

## 2017-10-28 ENCOUNTER — Encounter (HOSPITAL_COMMUNITY): Payer: Self-pay

## 2017-10-28 ENCOUNTER — Other Ambulatory Visit: Payer: Self-pay

## 2017-10-28 DIAGNOSIS — K831 Obstruction of bile duct: Secondary | ICD-10-CM

## 2017-10-28 DIAGNOSIS — O24425 Gestational diabetes mellitus in childbirth, controlled by oral hypoglycemic drugs: Secondary | ICD-10-CM

## 2017-10-28 DIAGNOSIS — O2662 Liver and biliary tract disorders in childbirth: Secondary | ICD-10-CM

## 2017-10-28 DIAGNOSIS — Z3A37 37 weeks gestation of pregnancy: Secondary | ICD-10-CM

## 2017-10-28 DIAGNOSIS — O24419 Gestational diabetes mellitus in pregnancy, unspecified control: Secondary | ICD-10-CM

## 2017-10-28 DIAGNOSIS — O99824 Streptococcus B carrier state complicating childbirth: Secondary | ICD-10-CM

## 2017-10-28 LAB — CBC
HCT: 29.3 % — ABNORMAL LOW (ref 36.0–46.0)
HCT: 27 % — ABNORMAL LOW (ref 36.0–46.0)
HEMOGLOBIN: 8.7 g/dL — AB (ref 12.0–15.0)
Hemoglobin: 9.4 g/dL — ABNORMAL LOW (ref 12.0–15.0)
MCH: 25.7 pg — ABNORMAL LOW (ref 26.0–34.0)
MCH: 25.7 pg — AB (ref 26.0–34.0)
MCHC: 32.1 g/dL (ref 30.0–36.0)
MCHC: 32.2 g/dL (ref 30.0–36.0)
MCV: 80.1 fL (ref 78.0–100.0)
MCV: 79.9 fL (ref 78.0–100.0)
Platelets: 212 10*3/uL (ref 150–400)
Platelets: 203 10*3/uL (ref 150–400)
RBC: 3.66 MIL/uL — AB (ref 3.87–5.11)
RBC: 3.38 MIL/uL — ABNORMAL LOW (ref 3.87–5.11)
RDW: 15.9 % — AB (ref 11.5–15.5)
RDW: 16 % — AB (ref 11.5–15.5)
WBC: 24.1 10*3/uL — AB (ref 4.0–10.5)
WBC: 20.5 10*3/uL — ABNORMAL HIGH (ref 4.0–10.5)

## 2017-10-28 MED ORDER — WITCH HAZEL-GLYCERIN EX PADS: 1.0000 "application " | MEDICATED_PAD | CUTANEOUS | Status: DC | PRN

## 2017-10-28 MED ORDER — VITAMIN K1 1 MG/0.5ML IJ SOLN
INTRAMUSCULAR | Status: AC
  Filled 2017-10-28: qty 0.5

## 2017-10-28 MED ORDER — ZOLPIDEM TARTRATE 5 MG PO TABS: 5.0000 mg | ORAL_TABLET | Freq: Every evening | ORAL | Status: DC | PRN

## 2017-10-28 MED ORDER — CLINDAMYCIN PHOSPHATE 900 MG/50ML IV SOLN
900.0000 mg | Freq: Three times a day (TID) | INTRAVENOUS | Status: DC
  Administered 2017-10-28: 900 mg via INTRAVENOUS
  Filled 2017-10-28 (×2): qty 50

## 2017-10-28 MED ORDER — FERROUS SULFATE 325 (65 FE) MG PO TABS
325.0000 mg | ORAL_TABLET | Freq: Two times a day (BID) | ORAL | Status: DC
  Administered 2017-10-28 – 2017-10-30 (×4): 325 mg via ORAL
  Filled 2017-10-28 (×4): qty 1

## 2017-10-28 MED ORDER — DIPHENHYDRAMINE HCL 25 MG PO CAPS: 25.0000 mg | ORAL_CAPSULE | Freq: Four times a day (QID) | ORAL | Status: DC | PRN

## 2017-10-28 MED ORDER — ACETAMINOPHEN 325 MG PO TABS
650.0000 mg | ORAL_TABLET | ORAL | Status: DC | PRN
  Administered 2017-10-29 – 2017-10-30 (×3): 650 mg via ORAL
  Filled 2017-10-28 (×3): qty 2

## 2017-10-28 MED ORDER — SIMETHICONE 80 MG PO CHEW
80.0000 mg | CHEWABLE_TABLET | ORAL | Status: DC | PRN
Start: 2017-10-28 — End: 2017-10-30
  Administered 2017-10-28 – 2017-10-30 (×4): 80 mg via ORAL
  Filled 2017-10-28 (×5): qty 1

## 2017-10-28 MED ORDER — TRANEXAMIC ACID 1000 MG/10ML IV SOLN
1000.0000 mg | INTRAVENOUS | Status: AC
  Administered 2017-10-28: 1000 mg via INTRAVENOUS
  Filled 2017-10-28: qty 1100

## 2017-10-28 MED ORDER — COCONUT OIL OIL: 1.0000 "application " | TOPICAL_OIL | Status: DC | PRN

## 2017-10-28 MED ORDER — BENZOCAINE-MENTHOL 20-0.5 % EX AERO
1.0000 "application " | INHALATION_SPRAY | CUTANEOUS | Status: DC | PRN
  Administered 2017-10-28: 1 via TOPICAL
  Filled 2017-10-28: qty 56

## 2017-10-28 MED ORDER — CARBOPROST TROMETHAMINE 250 MCG/ML IM SOLN
INTRAMUSCULAR | Status: AC
  Filled 2017-10-28: qty 1

## 2017-10-28 MED ORDER — METHYLERGONOVINE MALEATE 0.2 MG/ML IJ SOLN
0.2000 mg | Freq: Once | INTRAMUSCULAR | Status: AC
  Administered 2017-10-28: 0.2 mg via INTRAMUSCULAR

## 2017-10-28 MED ORDER — ONDANSETRON HCL 4 MG/2ML IJ SOLN
4.0000 mg | INTRAMUSCULAR | Status: DC | PRN
  Administered 2017-10-28: 4 mg via INTRAVENOUS
  Filled 2017-10-28: qty 2

## 2017-10-28 MED ORDER — GENTAMICIN SULFATE 40 MG/ML IJ SOLN
160.0000 mg | Freq: Once | INTRAVENOUS | Status: AC
  Administered 2017-10-28: 160 mg via INTRAVENOUS
  Filled 2017-10-28: qty 4

## 2017-10-28 MED ORDER — MORPHINE SULFATE (PF) 4 MG/ML IV SOLN
2.0000 mg | INTRAVENOUS | Status: DC | PRN
  Administered 2017-10-28 (×2): 2 mg via INTRAVENOUS
  Filled 2017-10-28 (×2): qty 1

## 2017-10-28 MED ORDER — GENTAMICIN SULFATE 40 MG/ML IJ SOLN
160.0000 mg | Freq: Three times a day (TID) | INTRAVENOUS | Status: DC
  Administered 2017-10-28 – 2017-10-29 (×3): 160 mg via INTRAVENOUS
  Filled 2017-10-28 (×4): qty 4

## 2017-10-28 MED ORDER — CARBOPROST TROMETHAMINE 250 MCG/ML IM SOLN: 250.0000 ug | INTRAMUSCULAR | Status: DC | PRN

## 2017-10-28 MED ORDER — DIBUCAINE 1 % RE OINT: 1.0000 "application " | TOPICAL_OINTMENT | RECTAL | Status: DC | PRN

## 2017-10-28 MED ORDER — MISOPROSTOL 200 MCG PO TABS: 800.0000 ug | ORAL_TABLET | Freq: Once | ORAL | Status: DC

## 2017-10-28 MED ORDER — OXYCODONE HCL 5 MG PO TABS
10.0000 mg | ORAL_TABLET | ORAL | Status: DC | PRN
  Administered 2017-10-29: 10 mg via ORAL
  Filled 2017-10-28: qty 2

## 2017-10-28 MED ORDER — PRENATAL MULTIVITAMIN CH
1.0000 | ORAL_TABLET | Freq: Every day | ORAL | Status: DC
  Filled 2017-10-28 (×2): qty 1

## 2017-10-28 MED ORDER — SENNOSIDES-DOCUSATE SODIUM 8.6-50 MG PO TABS
2.0000 | ORAL_TABLET | ORAL | Status: DC
  Administered 2017-10-28 – 2017-10-29 (×2): 2 via ORAL
  Filled 2017-10-28 (×2): qty 2

## 2017-10-28 MED ORDER — LACTATED RINGERS IV SOLN: INTRAVENOUS | Status: DC

## 2017-10-28 MED ORDER — TETANUS-DIPHTH-ACELL PERTUSSIS 5-2.5-18.5 LF-MCG/0.5 IM SUSP: 0.5000 mL | Freq: Once | INTRAMUSCULAR | Status: DC

## 2017-10-28 MED ORDER — IBUPROFEN 600 MG PO TABS
600.0000 mg | ORAL_TABLET | Freq: Four times a day (QID) | ORAL | Status: DC
  Administered 2017-10-28 – 2017-10-30 (×9): 600 mg via ORAL
  Filled 2017-10-28 (×9): qty 1

## 2017-10-28 MED ORDER — OXYCODONE HCL 5 MG PO TABS
5.0000 mg | ORAL_TABLET | ORAL | Status: DC | PRN
  Filled 2017-10-28: qty 1

## 2017-10-28 MED ORDER — ONDANSETRON HCL 4 MG PO TABS: 4.0000 mg | ORAL_TABLET | ORAL | Status: DC | PRN

## 2017-10-28 NOTE — Anesthesia Postprocedure Evaluation (Signed)
Anesthesia Post Note  Patient: Stacey Cook  Procedure(s) Performed: AN AD HOC LABOR EPIDURAL     Patient location during evaluation: [facility] Unit Anesthesia Type: Epidural Level of consciousness: awake and alert Pain management: pain level controlled Vital Signs Assessment: post-procedure vital signs reviewed and stable Respiratory status: spontaneous breathing, nonlabored ventilation and respiratory function stable Cardiovascular status: stable Postop Assessment: no headache, no backache, epidural receding, no apparent nausea or vomiting, patient able to bend at knees, able to ambulate and adequate PO intake Anesthetic complications: no    Last Vitals:  Vitals:   10/28/17 0618 10/28/17 0757  BP: 126/84 120/68  Pulse: 75 93  Resp: 19 20  Temp: 36.7 C 36.4 C  SpO2: 98% 95%    Last Pain:  Vitals:   10/28/17 0849  TempSrc:   PainSc: Asleep   Pain Goal: Patients Stated Pain Goal: 5 (10/28/17 0723)               Jabier Mutton

## 2017-10-28 NOTE — Progress Notes (Signed)
Patient is doing well, repeat hemoglobin was 8.7 (from 9.7 before delivery). Asymptomatic at this time.  Already undergoing Bakri balloon deflation and removal as per protocol. No significant bleeding noted so far.  Oral iron therapy ordered, will recheck CBC in am. Continue close monitoring.  Stacey Schneiders, MD, [place] for [employer], [place]

## 2017-10-28 NOTE — Progress Notes (Signed)
Called back to room for ongoing bleeding, Bakri balloon noted to be mostly out of cervix. Deflated balloon, reinserted into uterus and re-inflated with 480 mL NS. One moist lap placed in vagina to hold bakri in place. 200 mL loss noted since delivery. Patient appears very pale, declines pain meds. Will get stat CBC now.    Feliz Beam, M.D. Attending [place], [facility] for [employer], [place]

## 2017-10-28 NOTE — Progress Notes (Signed)
Faculty Note  In to check patient, she is feeling well with some urge to push. Has been pushing with RN.   BP 133/66   Pulse 98   Temp 98.2 F (36.8 C) (Oral)   Resp 20   Ht 4\' 11"  (1.499 m)   Wt 220 lb 1.6 oz (99.8 kg)   LMP 02/10/2017   SpO2 98%   BMI 44.45 kg/m   Gen: alert, oriented, some pain in back SVE: 10/100/+2   A/P: 22 yo G2P0010 @ [redacted]w[redacted]d for induction of labor for cholestasis, progressing well. Will allow to labor down 30 min and resume pushing.    Cont current management   K. Arvilla Meres, M.D. Attending [place], [facility] for [employer], [place]

## 2017-10-29 DIAGNOSIS — O9081 Anemia of the puerperium: Secondary | ICD-10-CM | POA: Diagnosis not present

## 2017-10-29 LAB — CBC
HCT: 23.1 % — ABNORMAL LOW (ref 36.0–46.0)
Hemoglobin: 7.5 g/dL — ABNORMAL LOW (ref 12.0–15.0)
MCH: 26 pg (ref 26.0–34.0)
MCHC: 32.5 g/dL (ref 30.0–36.0)
MCV: 80.2 fL (ref 78.0–100.0)
PLATELETS: 195 10*3/uL (ref 150–400)
RBC: 2.88 MIL/uL — ABNORMAL LOW (ref 3.87–5.11)
RDW: 16.2 % — ABNORMAL HIGH (ref 11.5–15.5)
WBC: 13.7 10*3/uL — ABNORMAL HIGH (ref 4.0–10.5)

## 2017-10-29 MED ORDER — SODIUM CHLORIDE 0.9 % IV SOLN
510.0000 mg | Freq: Once | INTRAVENOUS | Status: AC
  Administered 2017-10-29: 510 mg via INTRAVENOUS
  Filled 2017-10-29: qty 17

## 2017-10-29 NOTE — Progress Notes (Signed)
Mother of baby was referred for history of depression and anxiety. Referral screened out by CSW because per chart review, patient is actively taking 10mg  Lexapro daily and 50mg  Trazadone daily to address her symptoms. Patient routinely visits her audiologist at [facility] also.  Please contact CSW if mother of baby requests, if needs arise, or if mother of baby scores greater than a nine or answers yes to question ten on [place] Postpartum Depression Screen.   Madilyn Fireman, MSW, LCSW-A Clinical Social Worker [facility] [phone]   .

## 2017-10-29 NOTE — Progress Notes (Signed)
Post Partum Day 1 s/p  SVD at [redacted]w[redacted]d after IOL for cholestasis; complicated by postpartum hemorrhage Subjective: No complaints, up ad lib, voiding, tolerating PO and + flatus  No presyncopal symptoms. Baby doing well at bedside. Bottlefeeding.  Objective: Blood pressure 112/68, pulse 99, temperature 98.6 F (37 C), temperature source Oral, resp. rate 18, height 4\' 11"  (1.499 m), weight 220 lb 1.6 oz (99.8 kg), last menstrual period 02/10/2017, SpO2 97 %, unknown if currently breastfeeding.  Physical Exam:  General: alert and no distress Lochia: appropriate Uterine Fundus: firm DVT Evaluation: No evidence of DVT seen on physical exam.  Negative Homan's sign. No cords or calf tenderness. No significant calf/ankle edema.  CBC Latest Ref Rng & Units 10/29/2017 10/28/2017 10/28/2017  WBC 4.0 - 10.5 K/uL 13.7(H) 20.5(H) 24.1(H)  Hemoglobin 12.0 - 15.0 g/dL 7.5(L) 8.7(L) 9.4(L)  Hematocrit 36.0 - 46.0 % 23.1(L) 27.0(L) 29.3(L)  Platelets 150 - 400 K/uL 195 203 212   Assessment/Plan: *Anemia due to postpartum hemorrhage: Asymptomatic.  Recommended blood transfusion vs feraheme; risks/benefits discussed. She wants Feraheme, this was ordered. Also continue oral iron therapy *Bottlefeeding, wants OCPs for contraception *Routine postpartum care; plan for discharge tomorroe    LOS: 2 days   Verita Schneiders, MD 10/29/2017, 10:22 AM

## 2017-10-30 DIAGNOSIS — D62 Acute posthemorrhagic anemia: Secondary | ICD-10-CM | POA: Diagnosis not present

## 2017-10-30 MED ORDER — CYCLOBENZAPRINE HCL 10 MG PO TABS: 10.0000 mg | ORAL_TABLET | Freq: Three times a day (TID) | ORAL | 0 refills | Status: DC | PRN

## 2017-10-30 NOTE — Discharge Summary (Signed)
OB Discharge Summary     Patient Name: Stacey Cook DOB: 12-Dec-1995 MRN: [id number]  Date of admission: 10/27/2017 Delivering MD: Sloan Leiter   Date of discharge: 10/30/2017  Admitting diagnosis: INDUCTION Intrauterine pregnancy: [redacted]w[redacted]d     Secondary diagnosis:  Principal Problem:   Status post induced vaginal delivery for cholestasis Active Problems:   Postpartum anemia   Acute blood loss anemia  Additional problems: postpartum hemorrhage     Discharge diagnosis: Term Pregnancy Delivered                                        Cholestasis of pregnancy                                                                                                Post partum procedures:iv feraheme, postpartum Bakri balloon x 24 hours  Augmentation: Pitocin, Cytotec and Foley Balloon  Complications: [id number]  Hospital course:  Induction of Labor With Vaginal Delivery   22 y.o. yo G2P1011 at [redacted]w[redacted]d was admitted to the hospital 10/27/2017 for induction of labor.  Indication for induction: Cholestasis of pregnancy.  Patient had an uncomplicated labor course as follows: Membrane Rupture Time/Date: 1:54 PM ,10/27/2017   Intrapartum Procedures: Episiotomy: None [1]                                         Lacerations:  Periurethral [8]  Patient had delivery of a Viable infant.  Information for the patient's newborn:  Maripaz, Mullan [[id number]]  Delivery Method: Vag-Spont   10/28/2017  Details of delivery can be found in separate delivery note, and notable for POSTPARTUM HEMORHAGE OF 1500 CC.Patient had a routine postpartum course. Patient is discharged home 10/30/17.  Physical exam  Vitals:   10/29/17 2044 10/29/17 2330 10/30/17 0424 10/30/17 0805  BP: 119/75 115/66 123/74 121/87  Pulse: 84 89 74 86  Resp: 18 17 17 18   Temp: 97.9 F (36.6 C) (!) 97.5 F (36.4 C) (!) 97.4 F (36.3 C) 97.9 F (36.6 C)  TempSrc:    Oral  SpO2: 98% 98% 100% 98%  Weight:      Height:        General: alert and cooperative Lochia: appropriate Uterine Fundus: firm Incision:  DVT Evaluation: No evidence of DVT seen on physical exam. Labs: Lab Results  Component Value Date   WBC 13.7 (H) 10/29/2017   HGB 7.5 (L) 10/29/2017   HCT 23.1 (L) 10/29/2017   MCV 80.2 10/29/2017   PLT 195 10/29/2017   CMP Latest Ref Rng & Units 10/18/2017  Glucose 65 - 99 mg/dL 80  BUN 6 - 20 mg/dL 8  Creatinine 0.57 - 1.00 mg/dL 0.52(L)  Sodium 134 - 144 mmol/L 136  Potassium 3.5 - 5.2 mmol/L 4.4  Chloride 96 - 106 mmol/L 102  CO2 20 - 29 mmol/L 19(L)  Calcium 8.7 - 10.2 mg/dL 9.7  Total Protein 6.0 - 8.5 g/dL 6.8  Total Bilirubin 0.0 - 1.2 mg/dL 0.2  Alkaline Phos 39 - 117 IU/L 301(H)  AST 0 - 40 IU/L 30  ALT 0 - 32 IU/L 25    Discharge instruction: per After Visit Summary and "Baby and Me Booklet".  After visit meds:  Allergies as of 10/30/2017      Reactions   Keflex [cephalexin] Other (See Comments)   Reaction:  Elevated liver enzymes    Other Other (See Comments)   Pt is allergic to surgical glue; Cats-congestion, sneezing, eyes watery, itching all over Reaction:  Infection    Adhesive [tape] Rash      Medication List    STOP taking these medications   metFORMIN 500 MG tablet Commonly known as:  GLUCOPHAGE   ursodiol 500 MG tablet Commonly known as:  ACTIGALL     TAKE these medications   acetaminophen 500 MG tablet Commonly known as:  TYLENOL Take 500 mg by mouth every 6 (six) hours as needed for moderate pain.   albuterol (2.5 MG/3ML) 0.083% nebulizer solution Commonly known as:  PROVENTIL Take 3 mLs (2.5 mg total) by nebulization every 6 (six) hours as needed for wheezing or shortness of breath.   cyclobenzaprine 10 MG tablet Commonly known as:  FLEXERIL Take 1 tablet (10 mg total) by mouth 3 (three) times daily as needed for muscle spasms.   FLINSTONES GUMMIES OMEGA-3 DHA PO Take by mouth. Takes 2 daily   omeprazole 20 MG capsule Commonly known as:   PRILOSEC Take 1 capsule (20 mg total) by mouth daily.       Diet: routine diet  Activity: Advance as tolerated. Pelvic rest for 6 weeks.   Outpatient follow up:6 weeks Follow up Appt: Future Appointments  Date Time Provider [place]  12/02/2017 11:00 AM Roma Schanz, CNM [facility]   Follow up Visit:No follow-ups on file.  Postpartum contraception: Abstinence  Newborn Data: Live born female  Birth Weight: 7 lb 4.8 oz (3311 g) APGAR: 2, 8  Newborn Delivery   Birth date/time:  10/28/2017 02:02:00 Delivery type:  Vaginal, Spontaneous     Baby Feeding: Breast and . Disposition:home with mother   10/30/2017 Jonnie Kind, MD

## 2017-10-30 NOTE — Progress Notes (Signed)
Discharge instructions and prescriptions given to pt. Discussed post-vaginal delivery care, signs and symptoms to report to the MD, breast care, upcoming appointments, and meds. Pt verbalizes understanding and has no questions or concerns at this time. IV taken out and pt tolerated well.

## 2017-11-30 ENCOUNTER — Emergency Department (HOSPITAL_COMMUNITY): Payer: Medicaid Other

## 2017-11-30 ENCOUNTER — Encounter (HOSPITAL_COMMUNITY): Payer: Self-pay | Admitting: Emergency Medicine

## 2017-11-30 ENCOUNTER — Emergency Department (HOSPITAL_COMMUNITY)
Admission: EM | Admit: 2017-11-30 | Discharge: 2017-11-30 | Disposition: A | Discharge status: Home / Self Care | Payer: Medicaid Other | Attending: Emergency Medicine | Admitting: Emergency Medicine

## 2017-11-30 ENCOUNTER — Other Ambulatory Visit: Payer: Self-pay

## 2017-11-30 DIAGNOSIS — Y999 Unspecified external cause status: Secondary | ICD-10-CM | POA: Diagnosis not present

## 2017-11-30 DIAGNOSIS — Y9301 Activity, walking, marching and hiking: Secondary | ICD-10-CM | POA: Diagnosis not present

## 2017-11-30 DIAGNOSIS — M25562 Pain in left knee: Secondary | ICD-10-CM | POA: Diagnosis present

## 2017-11-30 DIAGNOSIS — Y92008 Other place in unspecified non-institutional (private) residence as the place of occurrence of the external cause: Secondary | ICD-10-CM | POA: Diagnosis not present

## 2017-11-30 DIAGNOSIS — J4521 Mild intermittent asthma with (acute) exacerbation: Secondary | ICD-10-CM | POA: Diagnosis not present

## 2017-11-30 DIAGNOSIS — W19XXXA Unspecified fall, initial encounter: Secondary | ICD-10-CM

## 2017-11-30 DIAGNOSIS — R42 Dizziness and giddiness: Secondary | ICD-10-CM | POA: Diagnosis not present

## 2017-11-30 DIAGNOSIS — R51 Headache: Secondary | ICD-10-CM | POA: Insufficient documentation

## 2017-11-30 DIAGNOSIS — Z79899 Other long term (current) drug therapy: Secondary | ICD-10-CM | POA: Insufficient documentation

## 2017-11-30 DIAGNOSIS — W01198A Fall on same level from slipping, tripping and stumbling with subsequent striking against other object, initial encounter: Secondary | ICD-10-CM | POA: Insufficient documentation

## 2017-11-30 DIAGNOSIS — Z87891 Personal history of nicotine dependence: Secondary | ICD-10-CM | POA: Insufficient documentation

## 2017-11-30 DIAGNOSIS — R519 Headache, unspecified: Secondary | ICD-10-CM

## 2017-11-30 MED ORDER — IPRATROPIUM-ALBUTEROL 0.5-2.5 (3) MG/3ML IN SOLN
3.0000 mL | Freq: Once | RESPIRATORY_TRACT | Status: AC
  Administered 2017-11-30: 3 mL via RESPIRATORY_TRACT
  Filled 2017-11-30: qty 3

## 2017-11-30 MED ORDER — ALBUTEROL SULFATE HFA 108 (90 BASE) MCG/ACT IN AERS
1.0000 | INHALATION_SPRAY | Freq: Once | RESPIRATORY_TRACT | Status: AC
  Administered 2017-11-30: 1 via RESPIRATORY_TRACT
  Filled 2017-11-30: qty 6.7

## 2017-11-30 NOTE — ED Provider Notes (Signed)
[facility] EMERGENCY DEPARTMENT Provider Note   CSN: [id number] Arrival date & time: 11/30/17  1325     History   Chief Complaint Chief Complaint  Patient presents with  . Fall    HPI Stacey Cook is a 22 y.o. female with history of ADD, binge eating disorder, chronic abdominal pain, gestational diabetes, low iron presents for evaluation of acute onset, constant left knee pain after mechanical fall just prior to arrival.  Patient states that she was outside with family when she got into an argument with a family member.  She states that she stood up and attempted to leave but tripped on the porch and landed on her left knee on a wooden post.  She denies any prodrome leading up to the fall including lightheadedness, shortness of breath, chest pains.  She notes acute onset of constant aching throbbing left knee pain along the anterior aspect of the left knee which will occasionally radiate down the lower extremity and up to the thigh.  Pain worsens with flexion and extension but improves when keeping the knee in one position.  She denies any numbness or tingling.  She states that she did "smack my hand on my forehead" but denies any loss of consciousness or significant head injury.  She does note a mild throbbing frontal headache but states this is similar to headaches she gets frequently.  She has associated phonophobia and photophobia but states this is also typical of her usual headaches.  She states she thinks she has a history of migraines but has never formally been diagnosed.  She denies nausea, vomiting, vision changes, lightheadedness, or dizziness.  No medications prior to arrival. She also states that she has been experiencing some chest tightness And wheezing intermittently for the past several weeks primarily with being outside.  She states that this worsened after her fall "because I got the wind knocked out of me ".  She states that she used to use an albuterol inhaler and albuterol  nebulizers but ran out of this medication.  The history is provided by the patient.    Past Medical History:  Diagnosis Date  . Abdominal pain   . ADD (attention deficit disorder)   . Anginal pain ([place])   . Anxiety   . Arthritis   . Asthma   . 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 Cervicalgia   . Cholestasis during pregnancy   . Chronic abdominal pain   . Complication of anesthesia   . Constipation   . Depression    not on meds  . Ear mass   . Gestational diabetes    pt states not been checking sugars regularly at home; nor has she been taking her Metformin  . Gestational diabetes   . Headache   . HSV infection   . Low iron   . PONV (postoperative nausea and vomiting)   . Suicidal intent   . UTI (lower urinary tract infection) 05/2014  . Vomiting     Patient Active Problem List   Diagnosis Date Noted  . Acute blood loss anemia 10/30/2017  . Status post induced vaginal delivery for cholestasis 10/29/2017  . Postpartum anemia 10/29/2017  . Cholestasis of pregnancy 09/21/2017  . Gestational diabetes mellitus, class A2 08/24/2017  . Chest pain due to GERD 08/05/2017  . Depression with anxiety 06/13/2017  . Supervision of high risk pregnancy, antepartum 04/28/2017  . History of miscarriage 01/18/2017  . Conductive hearing loss, unilat, unrestrict hearing contralateral side 12/10/2015  .  Severe episode of recurrent major depressive disorder, without psychotic features ([place])   . 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 Asthma, mild intermittent 02/18/2015  . 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 Perforation of left tympanic membrane 06/19/2014  . Cervicalgia 12/20/2013  . Abdominal pain, chronic, epigastric 08/08/2013  . Acne 08/22/2012  . Syncope 08/16/2012  . GAD (generalized anxiety disorder) 08/01/2012  . ADHD  (attention deficit hyperactivity disorder), combined type 08/01/2012  . ODD (oppositional defiant disorder) 08/01/2012  . Vasovagal syncope 02/28/2012  . Cholesteatoma of attic of right ear 02/15/2011    Past Surgical History:  Procedure Laterality Date  . CHOLESTEATOMA EXCISION    . DILATION AND EVACUATION N/A 09/11/2016   Procedure: DILATATION AND CURRATAGE  2ND TRIMESTER;  Surgeon: Jonnie Kind, MD;  Location: [facility];  Service: Gynecology;  Laterality: N/A;  . IMPLANTATION BONE ANCHORED HEARING AID Right 04/2013  . 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 TYMPANOPLASTY Left   . TYMPANOSTOMY       OB History    Gravida  2   Para  1   Term  1   Preterm  0   AB  1   Living  1     SAB  1   TAB  0   Ectopic  0   Multiple  0   Live Births  1            Home Medications    Prior to Admission medications   Medication Sig Start Date End Date Taking? Authorizing Provider  acetaminophen (TYLENOL) 500 MG tablet Take 500 mg by mouth every 6 (six) hours as needed for moderate pain.     [provider]  albuterol (PROVENTIL) (2.5 MG/3ML) 0.083% nebulizer solution Take 3 mLs (2.5 mg total) by nebulization every 6 (six) hours as needed for wheezing or shortness of breath. 09/05/16   Virgel Manifold, MD  cyclobenzaprine (FLEXERIL) 10 MG tablet Take 1 tablet (10 mg total) by mouth 3 (three) times daily as needed for muscle spasms. 10/30/17   Jonnie Kind, MD  omeprazole (PRILOSEC) 20 MG capsule Take 1 capsule (20 mg total) by mouth daily. 08/05/17   Jonnie Kind, MD  Pediatric Multiple Vit-C-FA (FLINSTONES GUMMIES OMEGA-3 DHA PO) Take by mouth. Takes 2 daily    [provider]    Family History Family History  Problem Relation Age of Onset  . Cholelithiasis Mother   . Kidney disease Mother        stones  . Depression Mother   . Hypertension Maternal Grandmother   . Diabetes Maternal Grandmother   . Stroke Maternal Grandmother    . Seizures Maternal Grandmother   . Asthma Maternal Grandmother   . Hyperlipidemia Maternal Grandmother   . Thyroid disease Maternal Grandmother   . Asthma Brother   . Seizures Maternal Uncle   . Cancer Other        breast- great aunt  . Celiac disease Neg Hx     Social History Social History   Tobacco Use  . Smoking status: Former Smoker    Packs/day: 0.00    Years: 1.00    Pack years: 0.00    Types: Cigarettes    Last attempt to quit: 03/11/2017    Years since quitting: 0.7  . Smokeless tobacco: Never Used  . Tobacco  comment: smokes 10 cig daily  Substance Use Topics  . Alcohol use: No    Alcohol/week: 0.0 oz  . Drug use: No     Allergies   Keflex [cephalexin]; Other; and Adhesive [tape]   Review of Systems Review of Systems  Constitutional: Negative for chills, diaphoresis, fatigue and fever.  Eyes: Positive for photophobia. Negative for visual disturbance.  Respiratory: Positive for chest tightness, shortness of breath and wheezing.   Gastrointestinal: Negative for nausea and vomiting.  Musculoskeletal: Positive for arthralgias (L knee).  Neurological: Positive for headaches. Negative for syncope, weakness, light-headedness and numbness.  All other systems reviewed and are negative.    Physical Exam Updated Vital Signs BP 112/75 (BP Location: Right Arm)   Pulse 95   Temp 98 F (36.7 C) (Oral)   Resp 16   Ht 4\' 11"  (1.499 m)   Wt 86.6 kg (191 lb)   LMP 11/13/2017 (Exact Date)   SpO2 98%   Breastfeeding? No   BMI 38.58 kg/m   Physical Exam  Constitutional: She is oriented to person, place, and time. She appears well-developed and well-nourished. No distress.  Resting comfortably in bed, smiling, laughing  HENT:  Head: Normocephalic and atraumatic.  No Battle's signs, no raccoon's eyes, no rhinorrhea. No hemotympanum. No tenderness to palpation of the face or skull. No deformity, crepitus, or swelling noted.   Eyes: Conjunctivae are normal.  Right eye exhibits no discharge. Left eye exhibits no discharge.  Neck: No JVD present. No tracheal deviation present.  Cardiovascular: Normal rate, regular rhythm, normal heart sounds and intact distal pulses.  2+ radial and DP/PT pulses bilaterally, Homans sign absent bilaterally, no lower extremity edema, compartments are soft   Pulmonary/Chest: Effort normal.  Mild scattered expiratory wheezing on auscultation of lungs.  Equal rise and fall of chest, no increased work of breathing.  No tenderness to palpation of the chest wall with no associated crepitus, ecchymosis, flail segment, or deformity.  Abdominal: Soft. Bowel sounds are normal. She exhibits no distension. There is no tenderness.  Musculoskeletal: Normal range of motion. She exhibits tenderness. She exhibits no edema.  Tenderness to palpation of the anterior aspect of the left knee overlying the patella and medial and lateral joint line.  No varus valgus instability, no ligamentous laxity, negative anterior/posterior drawer test.  Normal active range of motion of the left knee with pain elicited in the midst of flexion and extension.  No ecchymosis, swelling, deformity, or crepitus noted.  5/5 strength of BUE and BLE major muscle groups. No midline spine TTP, no paraspinal muscle tenderness, no deformity, crepitus, or step-off noted   Neurological: She is alert and oriented to person, place, and time. No cranial nerve deficit or sensory deficit. She exhibits normal muscle tone.  Mental Status:  Alert, thought content appropriate, able to give a coherent history. Speech fluent without evidence of aphasia. Able to follow 2 step commands without difficulty.  Cranial Nerves:  II:  Peripheral visual fields grossly normal, pupils equal, round, reactive to light III,IV, VI: ptosis not present, extra-ocular motions intact bilaterally  V,VII: smile symmetric, facial light touch sensation equal VIII: hearing grossly normal to voice  X: uvula  elevates symmetrically  XI: bilateral shoulder shrug symmetric and strong XII: midline tongue extension without fassiculations Motor:  Normal tone. 5/5 strength of BUE and BLE major muscle groups including strong and equal grip strength and dorsiflexion/plantar flexion Sensory: light touch normal in all extremities. Cerebellar: normal finger-to-nose with bilateral upper extremities Gait: normal  gait and balance. Able to walk on toes and heels with ease, although painful to walk on toes secondary to left knee pain    Skin: Skin is warm and dry. No erythema.  Psychiatric: She has a normal mood and affect. Her behavior is normal.  Nursing note and vitals reviewed.    ED Treatments / Results  Labs (all labs ordered are listed, but only abnormal results are displayed) Labs Reviewed - No data to display  EKG None  Radiology Dg Knee Complete 4 Views Left  Result Date: 11/30/2017 CLINICAL DATA:  22 year old female with left knee pain after fall on stairs. EXAM: LEFT KNEE - COMPLETE 4+ VIEW COMPARISON:  Left knee series 11/07/2009. FINDINGS: The patient is now skeletally mature. Bone mineralization is within normal limits. No evidence of fracture, dislocation, or joint effusion. No evidence of arthropathy or other focal bone abnormality. Soft tissues are unremarkable. IMPRESSION: Negative. Electronically Signed   By: Genevie Ann M.D.   On: 11/30/2017 15:24    Procedures Procedures (including critical care time)  Medications Ordered in ED Medications  ipratropium-albuterol (DUONEB) 0.5-2.5 (3) MG/3ML nebulizer solution 3 mL (3 mLs Nebulization Given 11/30/17 1528)  albuterol (PROVENTIL HFA;VENTOLIN HFA) 108 (90 Base) MCG/ACT inhaler 1 puff (1 puff Inhalation Given 11/30/17 1612)     Initial Impression / Assessment and Plan / ED Course  I have reviewed the triage vital signs and the nursing notes.  Pertinent labs & imaging results that were available during my care of the patient were  reviewed by me and considered in my medical decision making (see chart for details).     Patient presents for evaluation status post mechanical fall with complaint of headache and left knee pain.  She is also complaining of shortness of breath secondary to wheezing which has been intermittent and mild for the past few weeks.  She is afebrile, vital signs are stable.  She is nontoxic in appearance.  Normal neuro exam and patient is ambulatory without difficulty.  No signs of basilar skull fracture.  She did not sustain any significant head trauma though she did hit her hand against her head.  She states the headache she is currently experiencing is consistent with headaches she has had in the past.  I highly doubt SAH, ICH, CVA, skull fracture, or other acute intracranial abnormality.  Radiographs of the knee show no acute osseous abnormality.  No ligamentous laxity noted. RICE therapy indicated and discussed.  Patient was given a brace.  Recommend follow-up with PCP or orthopedist for reevaluation.  She did have mild wheezing on auscultation which resolved entirely after breathing treatment.  Will give albuterol inhaler as she has run out of this.  Doubt pneumonia, PE, rib fractures, or other cardiopulmonary abnormality.  Again recommend follow-up with PCP for reevaluation of symptoms.  Discussed strict ED return precautions.  Patient and patient's family verbalized understanding of and agreement with plan and patient stable for discharge home at this time.  Final Clinical Impressions(s) / ED Diagnoses   Final diagnoses:  Fall, initial encounter  Acute pain of left knee  Mild intermittent asthma with exacerbation  Frontal headache    ED Discharge Orders    None       Renita Papa, PA-C 12/02/17 [place], [place], MD 12/03/17 [phone]

## 2017-11-30 NOTE — ED Notes (Signed)
Have paged respiratory  

## 2017-11-30 NOTE — Discharge Instructions (Signed)
1. Medications: Alternate 600 mg of ibuprofen and [phone] mg of Tylenol every 3 hours as needed for pain. Do not exceed 4000 mg of Tylenol daily.  Take ibuprofen with food to avoid upset stomach issues.  Use albuterol inhaler as needed for shortness of breath/wheezing. 2. Treatment: rest, drink plenty of fluids, gentle stretching as discussed (see attached), alternate ice and heat (or stick with whichever feels best) 20 minutes on 20 minutes off. Elevate the extremity when you are not using it.  Stay in a dimly lit room with minimal stimuli and try to cut down screen time by at least 50%.   3. Follow Up: Please followup with your primary doctor in 3-7 days for discussion of your diagnoses and further evaluation after today's visit; Return to the ER for worsening pain, severe swelling or redness of the joints, fevers, severe headache, passing out, shortness of breath, or chest pains.

## 2017-11-30 NOTE — ED Triage Notes (Signed)
Fell at 1230 on [employer].  When she landed, head hit her hand.  Denies LOC but did have headache 30 minutes following fall.  Rates pain 8/10.  History of asthma, did had have wheezing until getting back into air conditioned room.  C/o left knee, rating pain 9/10.  Denies any other pain.

## 2017-12-02 ENCOUNTER — Ambulatory Visit (INDEPENDENT_AMBULATORY_CARE_PROVIDER_SITE_OTHER): Payer: Medicaid Other | Admitting: Women's Health

## 2017-12-02 ENCOUNTER — Encounter: Payer: Self-pay | Admitting: Women's Health

## 2017-12-02 DIAGNOSIS — Z862 Personal history of diseases of the blood and blood-forming organs and certain disorders involving the immune mechanism: Secondary | ICD-10-CM

## 2017-12-02 DIAGNOSIS — Z8759 Personal history of other complications of pregnancy, childbirth and the puerperium: Secondary | ICD-10-CM | POA: Insufficient documentation

## 2017-12-02 DIAGNOSIS — D649 Anemia, unspecified: Secondary | ICD-10-CM

## 2017-12-02 DIAGNOSIS — Z3202 Encounter for pregnancy test, result negative: Secondary | ICD-10-CM

## 2017-12-02 DIAGNOSIS — Z8719 Personal history of other diseases of the digestive system: Secondary | ICD-10-CM

## 2017-12-02 LAB — POCT URINE PREGNANCY: Preg Test, Ur: NEGATIVE

## 2017-12-02 NOTE — Patient Instructions (Signed)
Constipation  Drink plenty of fluid, preferably water, throughout the day  Eat foods high in fiber such as fruits, vegetables, and grains  Exercise, such as walking, is a good way to keep your bowels regular  Drink warm fluids, especially warm prune juice, or decaf coffee  Eat a 1/2 cup of real oatmeal (not instant), 1/2 cup applesauce, and 1/2-1 cup warm prune juice every day  If needed, you may take Colace (docusate sodium) stool softener once or twice a day to help keep the stool soft. If you are pregnant, wait until you are out of your first trimester (12-14 weeks of pregnancy)  If you still are having problems with constipation, you may take Miralax once daily as needed to help keep your bowels regular.  If you are pregnant, wait until you are out of your first trimester (12-14 weeks of pregnancy)    Ethinyl Estradiol; Etonogestrel vaginal ring What is this medicine? ETHINYL ESTRADIOL; ETONOGESTREL (ETH in il es tra DYE ole; et oh noe JES trel) vaginal ring is a flexible, vaginal ring used as a contraceptive (birth control method). This medicine combines two types of female hormones, an estrogen and a progestin. This ring is used to prevent ovulation and pregnancy. Each ring is effective for one month. This medicine may be used for other purposes; ask your health care provider or pharmacist if you have questions. COMMON BRAND NAME(S): NuvaRing What should I tell my health care provider before I take this medicine? They need to know if you have or ever had any of these conditions: -abnormal vaginal bleeding -blood vessel disease or blood clots -breast, cervical, endometrial, ovarian, liver, or uterine cancer -diabetes -gallbladder disease -heart disease or recent heart attack -high blood pressure -high cholesterol -kidney disease -liver disease -migraine headaches -stroke -systemic lupus erythematosus (SLE) -tobacco smoker -an unusual or allergic reaction to estrogens,  progestins, other medicines, foods, dyes, or preservatives -pregnant or trying to get pregnant -breast-feeding How should I use this medicine? Insert the ring into your vagina as directed. Follow the directions on the prescription label. The ring will remain place for 3 weeks and is then removed for a 1-week break. A new ring is inserted 1 week after the last ring was removed, on the same day of the week. Check often to make sure the ring is still in place, especially before and after sexual intercourse. If the ring was out of the vagina for an unknown amount of time, you may not be protected from pregnancy. Perform a pregnancy test and call your doctor. Do not use more often than directed. A patient package insert for the product will be given with each prescription and refill. Read this sheet carefully each time. The sheet may change frequently. Contact your pediatrician regarding the use of this medicine in children. Special care may be needed. This medicine has been used in female children who have started having menstrual periods. Overdosage: If you think you have taken too much of this medicine contact a poison control center or emergency room at once. NOTE: This medicine is only for you. Do not share this medicine with others. What if I miss a dose? You will need to replace your vaginal ring once a month as directed. If the ring should slip out, or if you leave it in longer or shorter than you should, contact your health care professional for advice. What may interact with this medicine? Do not take this medicine with the following medication: -dasabuvir; ombitasvir; paritaprevir; ritonavir -  ombitasvir; paritaprevir; ritonavir This medicine may also interact with the following medications: -acetaminophen -antibiotics or medicines for infections, especially rifampin, rifabutin, rifapentine, and griseofulvin, and possibly penicillins or tetracyclines -aprepitant -ascorbic acid (vitamin  C) -atorvastatin -barbiturate medicines, such as phenobarbital -bosentan -carbamazepine -caffeine -clofibrate -cyclosporine -dantrolene -doxercalciferol -felbamate -grapefruit juice -hydrocortisone -medicines for anxiety or sleeping problems, such as diazepam or temazepam -medicines for diabetes, including pioglitazone -modafinil -mycophenolate -nefazodone -oxcarbazepine -phenytoin -prednisolone -ritonavir or other medicines for HIV infection or AIDS -rosuvastatin -selegiline -soy isoflavones supplements -St. John's wort -tamoxifen or raloxifene -theophylline -thyroid hormones -topiramate -warfarin This list may not describe all possible interactions. Give your health care provider a list of all the medicines, herbs, non-prescription drugs, or dietary supplements you use. Also tell them if you smoke, drink alcohol, or use illegal drugs. Some items may interact with your medicine. What should I watch for while using this medicine? Visit your doctor or health care professional for regular checks on your progress. You will need a regular breast and pelvic exam and Pap smear while on this medicine. Use an additional method of contraception during the first cycle that you use this ring. Do not use a diaphragm or female condom, as the ring can interfere with these birth control methods and their proper placement. If you have any reason to think you are pregnant, stop using this medicine right away and contact your doctor or health care professional. If you are using this medicine for hormone related problems, it may take several cycles of use to see improvement in your condition. Smoking increases the risk of getting a blood clot or having a stroke while you are using hormonal birth control, especially if you are more than 20 years old. You are strongly advised not to smoke. This medicine can make your body retain fluid, making your fingers, hands, or ankles swell. Your blood pressure  can go up. Contact your doctor or health care professional if you feel you are retaining fluid. This medicine can make you more sensitive to the sun. Keep out of the sun. If you cannot avoid being in the sun, wear protective clothing and use sunscreen. Do not use sun lamps or tanning beds/booths. If you wear contact lenses and notice visual changes, or if the lenses begin to feel uncomfortable, consult your eye care specialist. In some women, tenderness, swelling, or minor bleeding of the gums may occur. Notify your dentist if this happens. Brushing and flossing your teeth regularly may help limit this. See your dentist regularly and inform your dentist of the medicines you are taking. If you are going to have elective surgery, you may need to stop using this medicine before the surgery. Consult your health care professional for advice. This medicine does not protect you against HIV infection (AIDS) or any other sexually transmitted diseases. What side effects may I notice from receiving this medicine? Side effects that you should report to your doctor or health care professional as soon as possible: -breast tissue changes or discharge -changes in vaginal bleeding during your period or between your periods -chest pain -coughing up blood -dizziness or fainting spells -headaches or migraines -leg, arm or groin pain -severe or sudden headaches -stomach pain (severe) -sudden shortness of breath -sudden loss of coordination, especially on one side of the body -speech problems -symptoms of vaginal infection like itching, irritation or unusual discharge -tenderness in the upper abdomen -vomiting -weakness or numbness in the arms or legs, especially on one side of the body -yellowing of  the eyes or skin Side effects that usually do not require medical attention (report to your doctor or health care professional if they continue or are bothersome): -breakthrough bleeding and spotting that continues  beyond the 3 initial cycles of pills -breast tenderness -mood changes, anxiety, depression, frustration, anger, or emotional outbursts -increased sensitivity to sun or ultraviolet light -nausea -skin rash, acne, or brown spots on the skin -weight gain (slight) This list may not describe all possible side effects. Call your doctor for medical advice about side effects. You may report side effects to FDA at 1-800-FDA-1088. Where should I keep my medicine? Keep out of the reach of children. Store at room temperature between 15 and 30 degrees C (59 and 86 degrees F) for up to 4 months. The product will expire after 4 months. Protect from light. Throw away any unused medicine after the expiration date. NOTE: This sheet is a summary. It may not cover all possible information. If you have questions about this medicine, talk to your doctor, pharmacist, or health care provider.  2018 [employer] Standard (2016-01-02 17:00:31)

## 2017-12-02 NOTE — Progress Notes (Signed)
POSTPARTUM VISIT Patient name: Stacey Cook MRN [id number]  Date of birth: 1996/01/21 Chief Complaint:   postpartum visit (has had 2 faint + UPT at home; + stomach cramps; interested in [place])  History of Present Illness:   Stacey Cook is a 22 y.o. G3P1011 Caucasian female being seen today for a postpartum visit. She is 4 weeks postpartum following a spontaneous vaginal delivery at 37.1 gestational weeks after IOL for ICP. Anesthesia: epidural. I have fully reviewed the prenatal and intrapartum course. Had shoulder dystocia resolved by McRobert's and delivery of posterior arm. Then [place]ink >1L, received uterotonics, TXA, and bakri balloon and then IV Fe. Hgb 9.7 on admission, down to 7.5 on d/c. Pregnancy complicated by cholestasis. Postpartum course has been uncomplicated. Bleeding no bleeding. Bowel function is some constipation. Bladder function is normal.  Patient is sexually active, has been using condoms, 1 broke, has had 3 +HPT all w/ faint lines Contraception method is wants NuvaRing if not pregnant.  [place] Postpartum Depression Screening: positive. Score 12. Seeing therapist at [facility], just recently restarted all meds, however she stopped them 3d ago w/ +HPT. Denies SI/HI/II. Goes back to therapist 7/30 and has doctor visit in Aug.    Last pap <21yo.  Results were n/a .  Patient's last menstrual period was 11/13/2017 (exact date).  Baby's course has been uncomplicated. Baby is feeding by bottle.  Review of Systems:   Pertinent items are noted in HPI Denies Abnormal vaginal discharge w/ itching/odor/irritation, headaches, visual changes, shortness of breath, chest pain, abdominal pain, severe nausea/vomiting, or problems with urination or bowel movements. Pertinent History Reviewed:  Reviewed past medical,surgical, obstetrical and family history.  Reviewed problem list, medications and allergies. OB History  Gravida Para Term Preterm AB Living  2 1 1  0 1 1  SAB TAB Ectopic  Multiple Live Births  1 0 0 0 1    # Outcome Date GA Lbr Len/2nd Weight Sex Delivery Anes PTL Lv  2 Term 10/28/17 [redacted]w[redacted]d 10:29 / 01:39 7 lb 4.8 oz (3.311 kg) M Vag-Spont EPI  LIV  1 SAB 09/11/16 [redacted]w[redacted]d          Physical Assessment:   Vitals:   12/02/17 1110  BP: 120/66  Pulse: 90  Weight: 181 lb (82.1 kg)  Height: 4\' 11"  (1.499 m)  Body mass index is 36.56 kg/m.       Physical Examination:   General appearance: alert, well appearing, and in no distress  Mental status: alert, oriented to person, place, and time  Skin: warm & dry   Cardiovascular: normal heart rate noted   Respiratory: normal respiratory effort, no distress   Breasts: deferred, no complaints   Abdomen: soft, non-tender   Pelvic: VULVA: normal appearing vulva with no masses, tenderness or lesions, UTERUS: uterus is normal size, shape, consistency and nontender  Rectal: no hemorrhoids  Extremities: no edema       Results for orders placed or performed in visit on 12/02/17 (from the past 24 hour(s))  POCT urine pregnancy   Collection Time: 12/02/17 11:15 AM  Result Value Ref Range   Preg Test, Ur Negative Negative    Assessment & Plan:  1) Postpartum exam 2) 4 wks s/p SVB after IOL for ICP, had shoulder dystocia and PPH 3) Bottlefeeding 4) Depression screening 5) Contraception counseling, pt prefers NuvaRing vaginal inserts if HCG neg 6) +HPT x 3, neg here today, will get HCG, if neg will rx nuvaring- will call pt w/ results 7) Anemia  d/t PPH> 7.5 on d/c from hospital, will check today, taking pnv 8) Constipation> gave printed prevention/relief measures  9) Dep/anx> currently off meds d/t +HPT, will call Mon w/ results of HCG if neg, resume meds/keep appt w/ therapist at [facility] on 7/30 as scheduled  Meds: No orders of the defined types were placed in this encounter.   Follow-up: Return for I will call pt Mon for f/u.   Orders Placed This Encounter  Procedures  . CBC  . Beta hCG quant (ref lab)  .  POCT urine pregnancy    [place], [facility] 12/02/2017 11:54 AM

## 2017-12-03 LAB — CBC
HEMATOCRIT: 36.8 % (ref 34.0–46.6)
Hemoglobin: 11.8 g/dL (ref 11.1–15.9)
MCH: 26.2 pg — ABNORMAL LOW (ref 26.6–33.0)
MCHC: 32.1 g/dL (ref 31.5–35.7)
MCV: 82 fL (ref 79–97)
PLATELETS: 260 10*3/uL (ref 150–450)
RBC: 4.51 x10E6/uL (ref 3.77–5.28)
RDW: 18 % — AB (ref 12.3–15.4)
WBC: 8.1 10*3/uL (ref 3.4–10.8)

## 2017-12-03 LAB — BETA HCG QUANT (REF LAB): hCG Quant: <1 m[IU]/mL

## 2017-12-05 ENCOUNTER — Telehealth: Payer: Self-pay | Admitting: Women's Health

## 2017-12-05 MED ORDER — ETONOGESTREL-ETHINYL ESTRADIOL 0.12-0.015 MG/24HR VA RING: VAGINAL_RING | VAGINAL | 3 refills | Status: DC

## 2017-12-05 NOTE — Telephone Encounter (Signed)
Called pt, notified BHCG neg, rx nuvaring, switched to front to schedule 54mth f/u.  Roma Schanz, CNM, [facility] 12/05/2017 9:37 AM

## 2017-12-05 NOTE — Telephone Encounter (Signed)
Please call pt with her lab results, advised we were short a nurse this am and may be a little while before a call back

## 2017-12-07 ENCOUNTER — Telehealth: Payer: Self-pay | Admitting: Obstetrics & Gynecology

## 2017-12-24 ENCOUNTER — Emergency Department (HOSPITAL_COMMUNITY)
Admission: EM | Admit: 2017-12-24 | Discharge: 2017-12-24 | Disposition: A | Discharge status: Home / Self Care | Payer: Medicaid Other | Source: Home / Self Care | Attending: Emergency Medicine | Admitting: Emergency Medicine

## 2017-12-24 ENCOUNTER — Emergency Department (HOSPITAL_COMMUNITY): Payer: Medicaid Other

## 2017-12-24 ENCOUNTER — Encounter (HOSPITAL_COMMUNITY): Payer: Self-pay

## 2017-12-24 ENCOUNTER — Emergency Department (HOSPITAL_COMMUNITY)
Admission: EM | Admit: 2017-12-24 | Discharge: 2017-12-24 | Disposition: A | Discharge status: Home / Self Care | Payer: Medicaid Other | Attending: Emergency Medicine | Admitting: Emergency Medicine

## 2017-12-24 ENCOUNTER — Other Ambulatory Visit: Payer: Self-pay

## 2017-12-24 ENCOUNTER — Encounter (HOSPITAL_COMMUNITY): Payer: Self-pay | Admitting: Emergency Medicine

## 2017-12-24 DIAGNOSIS — N390 Urinary tract infection, site not specified: Secondary | ICD-10-CM | POA: Diagnosis not present

## 2017-12-24 DIAGNOSIS — R1010 Upper abdominal pain, unspecified: Secondary | ICD-10-CM | POA: Diagnosis present

## 2017-12-24 DIAGNOSIS — Z79899 Other long term (current) drug therapy: Secondary | ICD-10-CM | POA: Insufficient documentation

## 2017-12-24 DIAGNOSIS — R1011 Right upper quadrant pain: Secondary | ICD-10-CM

## 2017-12-24 DIAGNOSIS — J45909 Unspecified asthma, uncomplicated: Secondary | ICD-10-CM | POA: Diagnosis not present

## 2017-12-24 DIAGNOSIS — F17210 Nicotine dependence, cigarettes, uncomplicated: Secondary | ICD-10-CM | POA: Insufficient documentation

## 2017-12-24 LAB — CBC WITH DIFFERENTIAL/PLATELET
Basophils Absolute: 0 10*3/uL (ref 0.0–0.1)
Basophils Relative: 0 %
EOS ABS: 0.3 10*3/uL (ref 0.0–0.7)
Eosinophils Relative: 2 %
HCT: 38.7 % (ref 36.0–46.0)
Hemoglobin: 12.7 g/dL (ref 12.0–15.0)
LYMPHS ABS: 1.2 10*3/uL (ref 0.7–4.0)
LYMPHS PCT: 11 %
MCH: 27.3 pg (ref 26.0–34.0)
MCHC: 32.8 g/dL (ref 30.0–36.0)
MCV: 83 fL (ref 78.0–100.0)
Monocytes Absolute: 0.9 10*3/uL (ref 0.1–1.0)
Monocytes Relative: 8 %
Neutro Abs: 9.4 10*3/uL — ABNORMAL HIGH (ref 1.7–7.7)
Neutrophils Relative %: 79 %
Platelets: 243 10*3/uL (ref 150–400)
RBC: 4.66 MIL/uL (ref 3.87–5.11)
RDW: 15.6 % — ABNORMAL HIGH (ref 11.5–15.5)
WBC: 11.8 10*3/uL — AB (ref 4.0–10.5)

## 2017-12-24 LAB — COMPREHENSIVE METABOLIC PANEL
ALK PHOS: 125 U/L (ref 38–126)
ALT: 42 U/L (ref 0–44)
ALT: 43 U/L (ref 0–44)
AST: 48 U/L — AB (ref 15–41)
AST: 43 U/L — ABNORMAL HIGH (ref 15–41)
Albumin: 4.5 g/dL (ref 3.5–5.0)
Albumin: 4.1 g/dL (ref 3.5–5.0)
Alkaline Phosphatase: 123 U/L (ref 38–126)
Anion gap: 9 (ref 5–15)
Anion gap: 7 (ref 5–15)
BUN: 11 mg/dL (ref 6–20)
BUN: 9 mg/dL (ref 6–20)
CALCIUM: 9.6 mg/dL (ref 8.9–10.3)
CO2: 25 mmol/L (ref 22–32)
CO2: 27 mmol/L (ref 22–32)
CREATININE: 0.66 mg/dL (ref 0.44–1.00)
Calcium: 9.7 mg/dL (ref 8.9–10.3)
Chloride: 102 mmol/L (ref 98–111)
Chloride: 102 mmol/L (ref 98–111)
Creatinine, Ser: 0.7 mg/dL (ref 0.44–1.00)
GFR calc Af Amer: >60 mL/min (ref >60)
GFR calc non Af Amer: >60 mL/min (ref >60)
GLUCOSE: 98 mg/dL (ref 70–99)
Glucose, Bld: 94 mg/dL (ref 70–99)
Potassium: 4 mmol/L (ref 3.5–5.1)
Potassium: 3.7 mmol/L (ref 3.5–5.1)
SODIUM: 136 mmol/L (ref 135–145)
Sodium: 136 mmol/L (ref 135–145)
Total Bilirubin: 0.6 mg/dL (ref 0.3–1.2)
Total Bilirubin: 1.1 mg/dL (ref 0.3–1.2)
Total Protein: 8.2 g/dL — ABNORMAL HIGH (ref 6.5–8.1)
Total Protein: 7.9 g/dL (ref 6.5–8.1)

## 2017-12-24 LAB — CBC
HCT: 38.1 % (ref 36.0–46.0)
Hemoglobin: 12.4 g/dL (ref 12.0–15.0)
MCH: 27 pg (ref 26.0–34.0)
MCHC: 32.5 g/dL (ref 30.0–36.0)
MCV: 83 fL (ref 78.0–100.0)
Platelets: 264 10*3/uL (ref 150–400)
RBC: 4.59 MIL/uL (ref 3.87–5.11)
RDW: 15.5 % (ref 11.5–15.5)
WBC: 12.6 10*3/uL — AB (ref 4.0–10.5)

## 2017-12-24 LAB — URINALYSIS, ROUTINE W REFLEX MICROSCOPIC
BILIRUBIN URINE: NEGATIVE
Glucose, UA: NEGATIVE mg/dL
Hgb urine dipstick: NEGATIVE
KETONES UR: NEGATIVE mg/dL
Nitrite: POSITIVE — AB
PROTEIN: NEGATIVE mg/dL
Specific Gravity, Urine: 1.015 (ref 1.005–1.030)
pH: 8 (ref 5.0–8.0)

## 2017-12-24 LAB — I-STAT BETA HCG BLOOD, ED (MC, WL, AP ONLY)

## 2017-12-24 LAB — LIPASE, BLOOD
LIPASE: 37 U/L (ref 11–51)
Lipase: 171 U/L — ABNORMAL HIGH (ref 11–51)

## 2017-12-24 MED ORDER — HYDROCODONE-ACETAMINOPHEN 5-325 MG PO TABS: 1.0000 | ORAL_TABLET | ORAL | 0 refills | Status: DC | PRN

## 2017-12-24 MED ORDER — IOPAMIDOL (ISOVUE-300) INJECTION 61%
100.0000 mL | Freq: Once | INTRAVENOUS | Status: AC | PRN
  Administered 2017-12-24: 100 mL via INTRAVENOUS

## 2017-12-24 MED ORDER — ONDANSETRON HCL 4 MG/2ML IJ SOLN
4.0000 mg | Freq: Once | INTRAMUSCULAR | Status: AC
  Administered 2017-12-24: 4 mg via INTRAVENOUS
  Filled 2017-12-24: qty 2

## 2017-12-24 MED ORDER — SODIUM CHLORIDE 0.9 % IV BOLUS
1000.0000 mL | Freq: Once | INTRAVENOUS | Status: AC
  Administered 2017-12-24: 1000 mL via INTRAVENOUS

## 2017-12-24 MED ORDER — MORPHINE SULFATE (PF) 4 MG/ML IV SOLN
4.0000 mg | Freq: Once | INTRAVENOUS | Status: AC
  Administered 2017-12-24: 4 mg via INTRAVENOUS
  Filled 2017-12-24: qty 1

## 2017-12-24 MED ORDER — ONDANSETRON 4 MG PO TBDP: 4.0000 mg | ORAL_TABLET | Freq: Three times a day (TID) | ORAL | 0 refills | Status: DC | PRN

## 2017-12-24 MED ORDER — CIPROFLOXACIN HCL 500 MG PO TABS: 500.0000 mg | ORAL_TABLET | Freq: Two times a day (BID) | ORAL | 0 refills | Status: DC

## 2017-12-24 MED ORDER — CIPROFLOXACIN IN D5W 400 MG/200ML IV SOLN
400.0000 mg | Freq: Once | INTRAVENOUS | Status: AC
  Administered 2017-12-24: 400 mg via INTRAVENOUS
  Filled 2017-12-24: qty 200

## 2017-12-24 MED ORDER — FAMOTIDINE IN NACL 20-0.9 MG/50ML-% IV SOLN
20.0000 mg | Freq: Once | INTRAVENOUS | Status: AC
  Administered 2017-12-24: 20 mg via INTRAVENOUS
  Filled 2017-12-24: qty 50

## 2017-12-24 MED ORDER — SODIUM CHLORIDE 0.9 % IV SOLN
Freq: Once | INTRAVENOUS | Status: AC
  Administered 2017-12-24: 02:00:00 via INTRAVENOUS

## 2017-12-24 MED ORDER — OMEPRAZOLE 20 MG PO CPDR: 20.0000 mg | DELAYED_RELEASE_CAPSULE | Freq: Every day | ORAL | 0 refills | Status: DC

## 2017-12-24 NOTE — ED Notes (Signed)
Call to Rad   [name] does not come back in for abd pain on weekends  Will schedule for am

## 2017-12-24 NOTE — Discharge Instructions (Signed)
You need to show up to registration at [facility] tomorrow for your ultrasound.  Do not eat anything after midnight.

## 2017-12-24 NOTE — ED Triage Notes (Signed)
Sharp stabbing pain across upper abd with vomiting and diarrhea that started 3 days ago worsening tonight.

## 2017-12-24 NOTE — ED Notes (Signed)
Pt reports that she was here last night for RUQ pain  encouraged to call for [name] today, but when she awakened her pain was so severe she could hardly move She has a 66 month old baby  Now with N/V pain to RUQ

## 2017-12-24 NOTE — ED Triage Notes (Signed)
Patient c/o generalized upper abd pain in which she was seen here in ER last night for. Nausea and vomiting reported. Denies any recent diarrhea or fevers. Patient states diagnosed with UTI and told she needed ultrasound for possible gallstones. Patient states she felt better after being discharged this morning but pain returned and was more severe when she woke this morning. Per patient took two Tylenol PMs to help with pain but vomited them back up

## 2017-12-24 NOTE — ED Provider Notes (Signed)
[facility] EMERGENCY DEPARTMENT Provider Note   CSN: [id number] Arrival date & time: 12/24/17  0111     History   Chief Complaint Chief Complaint  Patient presents with  . Abdominal Pain    HPI Stacey Cook is a 22 y.o. female.  Patient presents with upper abdominal pain for the past 3 days that is sharp pain associated with one episode of vomiting and several episodes of diarrhea.  Reports the pain is constant and became worse tonight.  She took ibuprofen without relief.  Does have a history of GERD but this feels different.  She gave birth 2 months ago and had cholestasis during pregnancy.  She still has a gallbladder and appendix.  She does not have a fever.  No pain with urination or blood in the urine.  No vaginal bleeding or discharge.  No chest pain or shortness of breath.  States this feels different than her acid reflux pain.  The pain is in the center of her abdomen and radiates to both sides of her back.  The history is provided by the patient.  Abdominal Pain   Associated symptoms include nausea and vomiting. Pertinent negatives include fever, dysuria, hematuria, arthralgias and myalgias.    Past Medical History:  Diagnosis Date  . Abdominal pain   . ADD (attention deficit disorder)   . Anginal pain ([place])   . Anxiety   . Arthritis   . Asthma   . 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 Cervicalgia   . Cholestasis during pregnancy   . Chronic abdominal pain   . Complication of anesthesia   . Constipation   . Depression    not on meds  . Ear mass   . Gestational diabetes    pt states not been checking sugars regularly at home; nor has she been taking her Metformin  . Gestational diabetes   . Headache   . HSV infection   . Low iron   . PONV (postoperative nausea and vomiting)   . Suicidal intent   . UTI (lower urinary tract infection) 05/2014  . Vomiting     Patient Active Problem List   Diagnosis Date Noted  . History of  shoulder dystocia in prior pregnancy 12/02/2017  . History of postpartum hemorrhage 12/02/2017  . Acute blood loss anemia 10/30/2017  . Postpartum anemia 10/29/2017  . History of cholestasis during pregnancy 09/21/2017  . Gestational diabetes mellitus, class A2 08/24/2017  . Chest pain due to GERD 08/05/2017  . Depression with anxiety 06/13/2017  . History of miscarriage 01/18/2017  . Conductive hearing loss, unilat, unrestrict hearing contralateral side 12/10/2015  . Severe episode of recurrent major depressive disorder, without psychotic features ([place])   . 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 Asthma, mild intermittent 02/18/2015  . 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 Perforation of left tympanic membrane 06/19/2014  . Cervicalgia 12/20/2013  . Abdominal pain, chronic, epigastric 08/08/2013  . Acne 08/22/2012  . Syncope 08/16/2012  . GAD (generalized anxiety disorder) 08/01/2012  . ADHD (attention deficit hyperactivity disorder), combined type 08/01/2012  . ODD (oppositional defiant disorder) 08/01/2012  . Vasovagal syncope 02/28/2012  . Cholesteatoma of attic of right ear 02/15/2011    Past Surgical History:  Procedure Laterality Date  . CHOLESTEATOMA EXCISION    . DILATION AND EVACUATION N/A 09/11/2016   Procedure: DILATATION AND CURRATAGE  2ND  TRIMESTER;  Surgeon: Jonnie Kind, MD;  Location: [facility];  Service: Gynecology;  Laterality: N/A;  . IMPLANTATION BONE ANCHORED HEARING AID Right 04/2013  . 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 TYMPANOPLASTY Left   . TYMPANOSTOMY       OB History    Gravida  2   Para  1   Term  1   Preterm  0   AB  1   Living  1     SAB  1   TAB  0   Ectopic  0   Multiple  0   Live Births  1            Home Medications    Prior to Admission medications   Medication Sig  Start Date End Date Taking? Authorizing Provider  Atomoxetine HCl (STRATTERA PO) Take by mouth daily.   Yes [provider]  cyclobenzaprine (FLEXERIL) 10 MG tablet Take 1 tablet (10 mg total) by mouth 3 (three) times daily as needed for muscle spasms. 10/30/17  Yes Jonnie Kind, MD  escitalopram (LEXAPRO) 20 MG tablet Take 20 mg by mouth daily.   Yes [provider]  Oxcarbazepine (TRILEPTAL) 300 MG tablet Take 300 mg by mouth 2 (two) times daily.   Yes [provider]  traZODone (DESYREL) 150 MG tablet Take by mouth at bedtime.   Yes [provider]  acetaminophen (TYLENOL) 500 MG tablet Take 500 mg by mouth every 6 (six) hours as needed for moderate pain.     [provider]  albuterol (PROVENTIL) (2.5 MG/3ML) 0.083% nebulizer solution Take 3 mLs (2.5 mg total) by nebulization every 6 (six) hours as needed for wheezing or shortness of breath. 09/05/16   Virgel Manifold, MD  etonogestrel-ethinyl estradiol (NUVARING) 0.12-0.015 MG/24HR vaginal ring Insert vaginally and leave in place for 3 consecutive weeks, then remove for 1 week. 12/05/17   Roma Schanz, CNM  omeprazole (PRILOSEC) 20 MG capsule Take 1 capsule (20 mg total) by mouth daily. 08/05/17   Jonnie Kind, MD    Family History Family History  Problem Relation Age of Onset  . Cholelithiasis Mother   . Kidney disease Mother        stones  . Depression Mother   . Hypertension Maternal Grandmother   . Diabetes Maternal Grandmother   . Stroke Maternal Grandmother   . Seizures Maternal Grandmother   . Asthma Maternal Grandmother   . Hyperlipidemia Maternal Grandmother   . Thyroid disease Maternal Grandmother   . Asthma Brother   . Seizures Maternal Uncle   . Cancer Other        breast- great aunt  . Celiac disease Neg Hx     Social History Social History   Tobacco Use  . Smoking status: Current Every Day Smoker    Packs/day: 0.50    Years: 1.00    Pack years: 0.50     Types: Cigarettes    Last attempt to quit: 03/11/2017    Years since quitting: 0.7  . Smokeless tobacco: Never Used  Substance Use Topics  . Alcohol use: No    Alcohol/week: 0.0 standard drinks  . Drug use: No     Allergies   Keflex [cephalexin]; Other; and Adhesive [tape]   Review of Systems Review of Systems  Constitutional: Negative for activity change, appetite change and fever.  HENT: Negative for congestion, nosebleeds and trouble swallowing.   Eyes: Negative for visual  disturbance.  Respiratory: Negative for cough, chest tightness and shortness of breath.   Cardiovascular: Negative for chest pain.  Gastrointestinal: Positive for abdominal pain, nausea and vomiting.  Genitourinary: Negative for dysuria and hematuria.  Musculoskeletal: Positive for back pain. Negative for arthralgias and myalgias.  Skin: Negative for rash.  Neurological: Negative for dizziness, seizures, weakness and numbness.   all other systems are negative except as noted in the HPI and PMH.     Physical Exam Updated Vital Signs BP 126/76   Pulse 70   Temp 97.8 F (36.6 C) (Oral)   Resp 18   Ht 4\' 11"  (1.499 m)   Wt 81.2 kg   LMP 12/04/2017 (Approximate)   SpO2 98%   BMI 36.15 kg/m   Physical Exam  Constitutional: She is oriented to person, place, and time. She appears well-developed and well-nourished. No distress.  HENT:  Head: Normocephalic and atraumatic.  Mouth/Throat: Oropharynx is clear and moist. No oropharyngeal exudate.  Eyes: Pupils are equal, round, and reactive to light. Conjunctivae and EOM are normal.  Neck: Normal range of motion. Neck supple.  No meningismus.  Cardiovascular: Normal rate, regular rhythm, normal heart sounds and intact distal pulses.  No murmur heard. Pulmonary/Chest: Effort normal and breath sounds normal. No respiratory distress.  Abdominal: Soft. There is tenderness. There is no rebound and no guarding.  TTP epigastrium and RUQ. Voluntary guarding    Musculoskeletal: Normal range of motion. She exhibits no edema or tenderness.  No CVAT  Neurological: She is alert and oriented to person, place, and time. No cranial nerve deficit. She exhibits normal muscle tone. Coordination normal.  No ataxia on finger to nose bilaterally. No pronator drift. 5/5 strength throughout. CN 2-12 intact.Equal grip strength. Sensation intact.   Skin: Skin is warm.  Psychiatric: She has a normal mood and affect. Her behavior is normal.  Nursing note and vitals reviewed.    ED Treatments / Results  Labs (all labs ordered are listed, but only abnormal results are displayed) Labs Reviewed  COMPREHENSIVE METABOLIC PANEL - Abnormal; Notable for the following components:      Result Value   Total Protein 8.2 (*)    AST 48 (*)    All other components within normal limits  CBC - Abnormal; Notable for the following components:   WBC 12.6 (*)    All other components within normal limits  URINALYSIS, ROUTINE W REFLEX MICROSCOPIC - Abnormal; Notable for the following components:   APPearance CLOUDY (*)    Nitrite POSITIVE (*)    Leukocytes, UA LARGE (*)    Bacteria, UA RARE (*)    All other components within normal limits  URINE CULTURE  LIPASE, BLOOD  I-STAT BETA HCG BLOOD, ED ([facility], [facility], [facility] ONLY)    EKG None  Radiology Ct Abdomen Pelvis W Contrast  Result Date: 12/24/2017 CLINICAL DATA:  Sharp stabbing pain across the upper abdomen. Vomiting and diarrhea. Symptoms began 3 days ago. EXAM: CT ABDOMEN AND PELVIS WITH CONTRAST TECHNIQUE: Multidetector CT imaging of the abdomen and pelvis was performed using the standard protocol following bolus administration of intravenous contrast. CONTRAST:  174mL ISOVUE-300 IOPAMIDOL (ISOVUE-300) INJECTION 61% COMPARISON:  07/05/2017 FINDINGS: Lower chest: Lung bases are clear. Hepatobiliary: No focal liver abnormality is seen. No gallstones, gallbladder wall thickening, or biliary dilatation. Pancreas: Unremarkable. No  pancreatic ductal dilatation or surrounding inflammatory changes. Spleen: Normal in size without focal abnormality. Adrenals/Urinary Tract: Adrenal glands are unremarkable. Kidneys are normal, without renal calculi, focal lesion, or  hydronephrosis. Bladder is unremarkable. Stomach/Bowel: Stomach is within normal limits. Appendix appears normal. No evidence of bowel wall thickening, distention, or inflammatory changes. Vascular/Lymphatic: No significant vascular findings are present. No enlarged abdominal or pelvic lymph nodes. Reproductive: Uterus and ovaries are not enlarged. Mildly lobular contour of the uterus suggest uterine fibroids. Probable involuting cyst on the left ovary. Small amount of free fluid in the pelvis is likely physiologic. Other: No abdominal wall hernia or abnormality. No abdominopelvic ascites. Musculoskeletal: No acute or significant osseous findings. IMPRESSION: No acute process demonstrated in the abdomen or pelvis. No evidence of bowel obstruction or inflammation. Involuting cyst in the left ovary with physiologic free fluid in the pelvis. Electronically Signed   By: Lucienne Capers M.D.   On: 12/24/2017 03:42    Procedures Procedures (including critical care time)  Medications Ordered in ED Medications  morphine 4 MG/ML injection 4 mg (has no administration in time range)  ondansetron (ZOFRAN) injection 4 mg (has no administration in time range)  0.9 %  sodium chloride infusion ( Intravenous New Bag/Given 12/24/17 0228)     Initial Impression / Assessment and Plan / ED Course  I have reviewed the triage vital signs and the nursing notes.  Pertinent labs & imaging results that were available during my care of the patient were reviewed by me and considered in my medical decision making (see chart for details).    Upper abdominal pain with nausea and vomiting.  No distress and no fever.  Patient 2 months postpartum.  Labs obtained which show normal LFTs and lipase.   Her cholestasis appears to have resolved. Bilirubin is normal.  Ultrasound not available and CT scan obtained shows normal gallbladder without visible gallstones. Start PPI. Will treat UTI, culture sent, cephalosporin allergy noted will give Cipro.  Discussed outpatient follow-up for right upper quadrant ultrasound.  PCP and as needed surgery follow-up.  Return to the ED if worsening pain, fever, vomiting or other concerns.  Final Clinical Impressions(s) / ED Diagnoses   Final diagnoses:  Upper abdominal pain  Urinary tract infection without hematuria, site unspecified    ED Discharge Orders    None       Analisse Randle, Annie Main, MD 12/24/17 [phone]

## 2017-12-24 NOTE — ED Notes (Signed)
Patient transported to CT 

## 2017-12-24 NOTE — Discharge Instructions (Signed)
Take the antibiotic as prescribed for urinary tract infection.  You should follow-up to have an ultrasound of her gallbladder tomorrow.  Take the stomach medication as your pain may be coming from your stomach as well.  Avoid alcohol, caffeine, NSAID medications such as ibuprofen and naproxen and spicy foods.  Return to the ED if develop new or worsening symptoms.  If your ultrasound shows gallstones, you should follow-up with the surgeon with information given.

## 2017-12-24 NOTE — ED Provider Notes (Signed)
[facility] EMERGENCY DEPARTMENT Provider Note   CSN: [id number] Arrival date & time: 12/24/17  1209     History   Chief Complaint Chief Complaint  Patient presents with  . Abdominal Pain    HPI Stacey Cook is a 22 y.o. female.  Pt presents to the ED today with abdominal pain.  She was here over night for the same thing.  She was d/c around 0500 feeling better.  Unfortunately, when she woke up, her pain was worse.  She had a ct abd/pelvis which was negative.  Pt denies f/c.     Past Medical History:  Diagnosis Date  . Abdominal pain   . ADD (attention deficit disorder)   . Anginal pain ([place])   . Anxiety   . Arthritis   . Asthma   . 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 Cervicalgia   . Cholestasis during pregnancy   . Chronic abdominal pain   . Complication of anesthesia   . Constipation   . Depression    not on meds  . Ear mass   . Gestational diabetes    pt states not been checking sugars regularly at home; nor has she been taking her Metformin  . Gestational diabetes   . Headache   . HSV infection   . Low iron   . PONV (postoperative nausea and vomiting)   . Suicidal intent   . UTI (lower urinary tract infection) 05/2014  . Vomiting     Patient Active Problem List   Diagnosis Date Noted  . History of shoulder dystocia in prior pregnancy 12/02/2017  . History of postpartum hemorrhage 12/02/2017  . Acute blood loss anemia 10/30/2017  . Postpartum anemia 10/29/2017  . History of cholestasis during pregnancy 09/21/2017  . Gestational diabetes mellitus, class A2 08/24/2017  . Chest pain due to GERD 08/05/2017  . Depression with anxiety 06/13/2017  . History of miscarriage 01/18/2017  . Conductive hearing loss, unilat, unrestrict hearing contralateral side 12/10/2015  . Severe episode of recurrent major depressive disorder, without psychotic features ([place])   . 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  Asthma, mild intermittent 02/18/2015  . 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 Perforation of left tympanic membrane 06/19/2014  . Cervicalgia 12/20/2013  . Abdominal pain, chronic, epigastric 08/08/2013  . Acne 08/22/2012  . Syncope 08/16/2012  . GAD (generalized anxiety disorder) 08/01/2012  . ADHD (attention deficit hyperactivity disorder), combined type 08/01/2012  . ODD (oppositional defiant disorder) 08/01/2012  . Vasovagal syncope 02/28/2012  . Cholesteatoma of attic of right ear 02/15/2011    Past Surgical History:  Procedure Laterality Date  . CHOLESTEATOMA EXCISION    . DILATION AND EVACUATION N/A 09/11/2016   Procedure: DILATATION AND CURRATAGE  2ND TRIMESTER;  Surgeon: Jonnie Kind, MD;  Location: [facility];  Service: Gynecology;  Laterality: N/A;  . IMPLANTATION BONE ANCHORED HEARING AID Right 04/2013  . 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 TYMPANOPLASTY Left   . TYMPANOSTOMY       OB History    Gravida  2   Para  1   Term  1   Preterm  0   AB  1   Living  1     SAB  1   TAB  0   Ectopic  0   Multiple  0  Live Births  1            Home Medications    Prior to Admission medications   Medication Sig Start Date End Date Taking? Authorizing Provider  acetaminophen (TYLENOL) 500 MG tablet Take 500 mg by mouth every 6 (six) hours as needed for moderate pain.    Yes [provider]  albuterol (PROVENTIL HFA;VENTOLIN HFA) 108 (90 Base) MCG/ACT inhaler Inhale 1-2 puffs into the lungs every 6 (six) hours as needed for wheezing or shortness of breath.   Yes [provider]  albuterol (PROVENTIL) (2.5 MG/3ML) 0.083% nebulizer solution Take 3 mLs (2.5 mg total) by nebulization every 6 (six) hours as needed for wheezing or shortness of breath. 09/05/16  Yes Virgel Manifold, MD  Atomoxetine HCl (STRATTERA PO) Take by mouth  daily.   Yes [provider]  cyclobenzaprine (FLEXERIL) 10 MG tablet Take 1 tablet (10 mg total) by mouth 3 (three) times daily as needed for muscle spasms. 10/30/17  Yes Jonnie Kind, MD  escitalopram (LEXAPRO) 20 MG tablet Take 20 mg by mouth daily.   Yes [provider]  Olopatadine HCl (PATADAY) 0.2 % SOLN Apply 2 drops to eye daily.   Yes [provider]  Oxcarbazepine (TRILEPTAL) 300 MG tablet Take 300 mg by mouth 2 (two) times daily.   Yes [provider]  traZODone (DESYREL) 150 MG tablet Take by mouth at bedtime.   Yes [provider]  ciprofloxacin (CIPRO) 500 MG tablet Take 1 tablet (500 mg total) by mouth 2 (two) times daily. 12/24/17   Rancour, Annie Main, MD  etonogestrel-ethinyl estradiol (NUVARING) 0.12-0.015 MG/24HR vaginal ring Insert vaginally and leave in place for 3 consecutive weeks, then remove for 1 week. 12/05/17   Roma Schanz, CNM  HYDROcodone-acetaminophen (NORCO/VICODIN) 5-325 MG tablet Take 1 tablet by mouth every 4 (four) hours as needed. 12/24/17   Isla Pence, MD  omeprazole (PRILOSEC) 20 MG capsule Take 1 capsule (20 mg total) by mouth daily. 12/24/17   Rancour, Annie Main, MD  ondansetron (ZOFRAN ODT) 4 MG disintegrating tablet Take 1 tablet (4 mg total) by mouth every 8 (eight) hours as needed. 12/24/17   Isla Pence, MD    Family History Family History  Problem Relation Age of Onset  . Cholelithiasis Mother   . Kidney disease Mother        stones  . Depression Mother   . Hypertension Maternal Grandmother   . Diabetes Maternal Grandmother   . Stroke Maternal Grandmother   . Seizures Maternal Grandmother   . Asthma Maternal Grandmother   . Hyperlipidemia Maternal Grandmother   . Thyroid disease Maternal Grandmother   . Asthma Brother   . Seizures Maternal Uncle   . Cancer Other        breast- great aunt  . Celiac disease Neg Hx     Social History Social History   Tobacco Use  . Smoking status:  Current Every Day Smoker    Packs/day: 0.50    Years: 1.00    Pack years: 0.50    Types: Cigarettes    Last attempt to quit: 03/11/2017    Years since quitting: 0.7  . Smokeless tobacco: Never Used  Substance Use Topics  . Alcohol use: No    Alcohol/week: 0.0 standard drinks  . Drug use: No     Allergies   Keflex [cephalexin]; Other; and Adhesive [tape]   Review of Systems Review of Systems  Gastrointestinal: Positive for abdominal pain, nausea and  vomiting.  All other systems reviewed and are negative.    Physical Exam Updated Vital Signs BP (!) 94/52 (BP Location: Right Arm)   Pulse 68   Temp 97.8 F (36.6 C) (Oral)   Resp 18   Ht 4\' 11"  (1.499 m)   Wt 81.2 kg   LMP 12/04/2017 (Approximate)   SpO2 94%   BMI 36.16 kg/m   Physical Exam  Constitutional: She is oriented to person, place, and time. She appears well-developed and well-nourished.  HENT:  Head: Normocephalic and atraumatic.  Mouth/Throat: Oropharynx is clear and moist.  Eyes: Pupils are equal, round, and reactive to light. EOM are normal.  Cardiovascular: Normal rate, regular rhythm, normal heart sounds and intact distal pulses.  Pulmonary/Chest: Effort normal and breath sounds normal.  Abdominal: Normal appearance. There is tenderness in the right upper quadrant and epigastric area.  Neurological: She is alert and oriented to person, place, and time.  Skin: Skin is warm. Capillary refill takes less than 2 seconds.  Psychiatric: She has a normal mood and affect. Her behavior is normal.  Nursing note and vitals reviewed.    ED Treatments / Results  Labs (all labs ordered are listed, but only abnormal results are displayed) Labs Reviewed  CBC WITH DIFFERENTIAL/PLATELET - Abnormal; Notable for the following components:      Result Value   WBC 11.8 (*)    RDW 15.6 (*)    Neutro Abs 9.4 (*)    All other components within normal limits  COMPREHENSIVE METABOLIC PANEL - Abnormal; Notable for the  following components:   AST 43 (*)    All other components within normal limits  LIPASE, BLOOD - Abnormal; Notable for the following components:   Lipase 171 (*)    All other components within normal limits    EKG None  Radiology Ct Abdomen Pelvis W Contrast  Result Date: 12/24/2017 CLINICAL DATA:  Sharp stabbing pain across the upper abdomen. Vomiting and diarrhea. Symptoms began 3 days ago. EXAM: CT ABDOMEN AND PELVIS WITH CONTRAST TECHNIQUE: Multidetector CT imaging of the abdomen and pelvis was performed using the standard protocol following bolus administration of intravenous contrast. CONTRAST:  115mL ISOVUE-300 IOPAMIDOL (ISOVUE-300) INJECTION 61% COMPARISON:  07/05/2017 FINDINGS: Lower chest: Lung bases are clear. Hepatobiliary: No focal liver abnormality is seen. No gallstones, gallbladder wall thickening, or biliary dilatation. Pancreas: Unremarkable. No pancreatic ductal dilatation or surrounding inflammatory changes. Spleen: Normal in size without focal abnormality. Adrenals/Urinary Tract: Adrenal glands are unremarkable. Kidneys are normal, without renal calculi, focal lesion, or hydronephrosis. Bladder is unremarkable. Stomach/Bowel: Stomach is within normal limits. Appendix appears normal. No evidence of bowel wall thickening, distention, or inflammatory changes. Vascular/Lymphatic: No significant vascular findings are present. No enlarged abdominal or pelvic lymph nodes. Reproductive: Uterus and ovaries are not enlarged. Mildly lobular contour of the uterus suggest uterine fibroids. Probable involuting cyst on the left ovary. Small amount of free fluid in the pelvis is likely physiologic. Other: No abdominal wall hernia or abnormality. No abdominopelvic ascites. Musculoskeletal: No acute or significant osseous findings. IMPRESSION: No acute process demonstrated in the abdomen or pelvis. No evidence of bowel obstruction or inflammation. Involuting cyst in the left ovary with physiologic  free fluid in the pelvis. Electronically Signed   By: Lucienne Capers M.D.   On: 12/24/2017 03:42    Procedures Procedures (including critical care time)  Medications Ordered in ED Medications  famotidine (PEPCID) IVPB 20 mg premix (20 mg Intravenous New Bag/Given 12/24/17 1510)  sodium chloride  0.9 % bolus 1,000 mL (0 mLs Intravenous Stopped 12/24/17 1519)  morphine 4 MG/ML injection 4 mg (4 mg Intravenous Given 12/24/17 1349)  ondansetron (ZOFRAN) injection 4 mg (4 mg Intravenous Given 12/24/17 1349)  morphine 4 MG/ML injection 4 mg (4 mg Intravenous Given 12/24/17 1508)     Initial Impression / Assessment and Plan / ED Course  I have reviewed the triage vital signs and the nursing notes.  Pertinent labs & imaging results that were available during my care of the patient were reviewed by me and considered in my medical decision making (see chart for details).     Unfortunately, pt arrived here after the ultrasound tech left for the day.  We were able to schedule an [name] to be done tomorrow at 0900.  NPO after midnight.  Return if worse.  Final Clinical Impressions(s) / ED Diagnoses   Final diagnoses:  RUQ pain    ED Discharge Orders         Ordered    ondansetron (ZOFRAN ODT) 4 MG disintegrating tablet  Every 8 hours PRN     12/24/17 1526    HYDROcodone-acetaminophen (NORCO/VICODIN) 5-325 MG tablet  Every 4 hours PRN     12/24/17 1526           Isla Pence, MD 12/24/17 1527

## 2017-12-25 ENCOUNTER — Ambulatory Visit (HOSPITAL_COMMUNITY)
Admission: RE | Admit: 2017-12-25 | Discharge: 2017-12-25 | Disposition: A | Discharge status: Home / Self Care | Payer: Medicaid Other | Source: Ambulatory Visit | Attending: Emergency Medicine | Admitting: Emergency Medicine

## 2017-12-25 DIAGNOSIS — R1011 Right upper quadrant pain: Secondary | ICD-10-CM | POA: Diagnosis not present

## 2017-12-26 LAB — URINE CULTURE

## 2017-12-27 ENCOUNTER — Telehealth: Payer: Self-pay | Admitting: Emergency Medicine

## 2017-12-27 NOTE — Telephone Encounter (Signed)
Post ED Visit - Positive Culture Follow-up  Culture report reviewed by antimicrobial stewardship pharmacist:  []  Elenor Quinones, Pharm.D. []  Heide Guile, Pharm.D., BCPS AQ-ID []  Parks Neptune, Pharm.D., BCPS []  Alycia Rossetti, Pharm.D., BCPS []  [place], Pharm.D., BCPS, AAHIVP []  Legrand Como, Pharm.D., BCPS, AAHIVP [x]  Salome Arnt, PharmD, BCPS []  Johnnette Gourd, PharmD, BCPS []  Hughes Better, PharmD, BCPS []  Leeroy Cha, PharmD  Positive urine culture Treated with ciprofloxacin, organism sensitive to the same and no further patient follow-up is required at this time.  Hazle Nordmann 12/27/2017, 9:28 AM

## 2018-01-12 ENCOUNTER — Other Ambulatory Visit: Payer: Self-pay

## 2018-01-12 ENCOUNTER — Observation Stay (HOSPITAL_COMMUNITY): Payer: Medicaid Other

## 2018-01-12 ENCOUNTER — Inpatient Hospital Stay (HOSPITAL_COMMUNITY)
Admission: EM | Admit: 2018-01-12 | Discharge: 2018-01-14 | DRG: 440 | Disposition: A | Discharge status: Home / Self Care | Payer: Medicaid Other | Attending: Internal Medicine | Admitting: Internal Medicine

## 2018-01-12 ENCOUNTER — Encounter (HOSPITAL_COMMUNITY): Payer: Self-pay | Admitting: Emergency Medicine

## 2018-01-12 ENCOUNTER — Emergency Department (HOSPITAL_COMMUNITY): Payer: Medicaid Other

## 2018-01-12 DIAGNOSIS — F418 Other specified anxiety disorders: Secondary | ICD-10-CM | POA: Diagnosis present

## 2018-01-12 DIAGNOSIS — Z8632 Personal history of gestational diabetes: Secondary | ICD-10-CM

## 2018-01-12 DIAGNOSIS — F17210 Nicotine dependence, cigarettes, uncomplicated: Secondary | ICD-10-CM | POA: Diagnosis present

## 2018-01-12 DIAGNOSIS — K219 Gastro-esophageal reflux disease without esophagitis: Secondary | ICD-10-CM

## 2018-01-12 DIAGNOSIS — E669 Obesity, unspecified: Secondary | ICD-10-CM | POA: Diagnosis present

## 2018-01-12 DIAGNOSIS — K861 Other chronic pancreatitis: Secondary | ICD-10-CM | POA: Diagnosis present

## 2018-01-12 DIAGNOSIS — Z825 Family history of asthma and other chronic lower respiratory diseases: Secondary | ICD-10-CM

## 2018-01-12 DIAGNOSIS — K851 Biliary acute pancreatitis without necrosis or infection: Principal | ICD-10-CM

## 2018-01-12 DIAGNOSIS — R945 Abnormal results of liver function studies: Secondary | ICD-10-CM | POA: Diagnosis not present

## 2018-01-12 DIAGNOSIS — Z803 Family history of malignant neoplasm of breast: Secondary | ICD-10-CM | POA: Diagnosis not present

## 2018-01-12 DIAGNOSIS — E661 Drug-induced obesity: Secondary | ICD-10-CM | POA: Diagnosis not present

## 2018-01-12 DIAGNOSIS — Z818 Family history of other mental and behavioral disorders: Secondary | ICD-10-CM

## 2018-01-12 DIAGNOSIS — Z68.41 Body mass index (BMI) pediatric, 5th percentile to less than 85th percentile for age: Secondary | ICD-10-CM

## 2018-01-12 DIAGNOSIS — Z8349 Family history of other endocrine, nutritional and metabolic diseases: Secondary | ICD-10-CM

## 2018-01-12 DIAGNOSIS — K859 Acute pancreatitis without necrosis or infection, unspecified: Secondary | ICD-10-CM | POA: Diagnosis present

## 2018-01-12 DIAGNOSIS — Z823 Family history of stroke: Secondary | ICD-10-CM | POA: Diagnosis not present

## 2018-01-12 DIAGNOSIS — Z8249 Family history of ischemic heart disease and other diseases of the circulatory system: Secondary | ICD-10-CM

## 2018-01-12 DIAGNOSIS — Z841 Family history of disorders of kidney and ureter: Secondary | ICD-10-CM | POA: Diagnosis not present

## 2018-01-12 DIAGNOSIS — Z91048 Other nonmedicinal substance allergy status: Secondary | ICD-10-CM

## 2018-01-12 DIAGNOSIS — J4520 Mild intermittent asthma, uncomplicated: Secondary | ICD-10-CM | POA: Diagnosis present

## 2018-01-12 DIAGNOSIS — Z881 Allergy status to other antibiotic agents status: Secondary | ICD-10-CM | POA: Diagnosis not present

## 2018-01-12 DIAGNOSIS — Z833 Family history of diabetes mellitus: Secondary | ICD-10-CM | POA: Diagnosis not present

## 2018-01-12 DIAGNOSIS — E6609 Other obesity due to excess calories: Secondary | ICD-10-CM | POA: Diagnosis not present

## 2018-01-12 DIAGNOSIS — Z6837 Body mass index (BMI) 37.0-37.9, adult: Secondary | ICD-10-CM | POA: Diagnosis not present

## 2018-01-12 HISTORY — DX: Acute pancreatitis without necrosis or infection, unspecified: K85.90

## 2018-01-12 LAB — COMPREHENSIVE METABOLIC PANEL
ALBUMIN: 4.3 g/dL (ref 3.5–5.0)
ALT: 103 U/L — ABNORMAL HIGH (ref 0–44)
ANION GAP: 6 (ref 5–15)
AST: 183 U/L — ABNORMAL HIGH (ref 15–41)
Alkaline Phosphatase: 148 U/L — ABNORMAL HIGH (ref 38–126)
BUN: 13 mg/dL (ref 6–20)
CHLORIDE: 107 mmol/L (ref 98–111)
CO2: 25 mmol/L (ref 22–32)
Calcium: 9.2 mg/dL (ref 8.9–10.3)
Creatinine, Ser: 0.74 mg/dL (ref 0.44–1.00)
GFR calc Af Amer: >60 mL/min (ref >60)
GFR calc non Af Amer: >60 mL/min (ref >60)
GLUCOSE: 105 mg/dL — AB (ref 70–99)
POTASSIUM: 3.8 mmol/L (ref 3.5–5.1)
SODIUM: 138 mmol/L (ref 135–145)
Total Bilirubin: 0.9 mg/dL (ref 0.3–1.2)
Total Protein: 7.9 g/dL (ref 6.5–8.1)

## 2018-01-12 LAB — URINALYSIS, ROUTINE W REFLEX MICROSCOPIC
Bilirubin Urine: NEGATIVE
GLUCOSE, UA: NEGATIVE mg/dL
Hgb urine dipstick: NEGATIVE
Ketones, ur: NEGATIVE mg/dL
NITRITE: NEGATIVE
PROTEIN: NEGATIVE mg/dL
SPECIFIC GRAVITY, URINE: 1.016 (ref 1.005–1.030)
pH: 7 (ref 5.0–8.0)

## 2018-01-12 LAB — CBC WITH DIFFERENTIAL/PLATELET
BASOS PCT: 1 %
Basophils Absolute: 0.1 10*3/uL (ref 0.0–0.1)
EOS ABS: 0.3 10*3/uL (ref 0.0–0.7)
EOS PCT: 4 %
HCT: 37.3 % (ref 36.0–46.0)
Hemoglobin: 12.4 g/dL (ref 12.0–15.0)
Lymphocytes Relative: 23 %
Lymphs Abs: 2.2 10*3/uL (ref 0.7–4.0)
MCH: 27.6 pg (ref 26.0–34.0)
MCHC: 33.2 g/dL (ref 30.0–36.0)
MCV: 83.1 fL (ref 78.0–100.0)
Monocytes Absolute: 0.9 10*3/uL (ref 0.1–1.0)
Monocytes Relative: 10 %
Neutro Abs: 5.9 10*3/uL (ref 1.7–7.7)
Neutrophils Relative %: 62 %
PLATELETS: 256 10*3/uL (ref 150–400)
RBC: 4.49 MIL/uL (ref 3.87–5.11)
RDW: 14.8 % (ref 11.5–15.5)
WBC: 9.4 10*3/uL (ref 4.0–10.5)

## 2018-01-12 LAB — I-STAT BETA HCG BLOOD, ED (MC, WL, AP ONLY): I-stat hCG, quantitative: <5 m[IU]/mL (ref <5)

## 2018-01-12 LAB — TRIGLYCERIDES: TRIGLYCERIDES: 163 mg/dL — AB (ref <150)

## 2018-01-12 LAB — LIPASE, BLOOD: Lipase: 572 U/L — ABNORMAL HIGH (ref 11–51)

## 2018-01-12 LAB — LACTATE DEHYDROGENASE: LDH: 327 U/L — ABNORMAL HIGH (ref 98–192)

## 2018-01-12 MED ORDER — SODIUM CHLORIDE 0.9 % IV BOLUS
1000.0000 mL | Freq: Once | INTRAVENOUS | Status: AC
  Administered 2018-01-12: 1000 mL via INTRAVENOUS

## 2018-01-12 MED ORDER — ONDANSETRON HCL 4 MG/2ML IJ SOLN
4.0000 mg | Freq: Once | INTRAMUSCULAR | Status: AC
  Administered 2018-01-12: 4 mg via INTRAVENOUS
  Filled 2018-01-12: qty 2

## 2018-01-12 MED ORDER — ACETAMINOPHEN 325 MG PO TABS
650.0000 mg | ORAL_TABLET | Freq: Four times a day (QID) | ORAL | Status: DC | PRN
  Administered 2018-01-13: 650 mg via ORAL
  Filled 2018-01-12: qty 2

## 2018-01-12 MED ORDER — OLOPATADINE HCL 0.1 % OP SOLN
2.0000 [drp] | Freq: Every day | OPHTHALMIC | Status: DC
  Administered 2018-01-12 – 2018-01-14 (×3): 2 [drp] via OPHTHALMIC
  Filled 2018-01-12: qty 5

## 2018-01-12 MED ORDER — ENOXAPARIN SODIUM 40 MG/0.4ML ~~LOC~~ SOLN
40.0000 mg | SUBCUTANEOUS | Status: DC
  Filled 2018-01-12: qty 0.4

## 2018-01-12 MED ORDER — SODIUM CHLORIDE 0.9 % IV SOLN
INTRAVENOUS | Status: AC
  Administered 2018-01-12: 06:00:00 via INTRAVENOUS

## 2018-01-12 MED ORDER — ONDANSETRON HCL 4 MG PO TABS
4.0000 mg | ORAL_TABLET | Freq: Four times a day (QID) | ORAL | Status: DC | PRN
  Administered 2018-01-13: 4 mg via ORAL
  Filled 2018-01-12 (×2): qty 1

## 2018-01-12 MED ORDER — GI COCKTAIL ~~LOC~~
30.0000 mL | Freq: Once | ORAL | Status: AC
  Administered 2018-01-12: 30 mL via ORAL
  Filled 2018-01-12: qty 30

## 2018-01-12 MED ORDER — MORPHINE SULFATE (PF) 4 MG/ML IV SOLN
4.0000 mg | Freq: Once | INTRAVENOUS | Status: AC
Start: 2018-01-12 — End: 2018-01-12
  Administered 2018-01-12: 4 mg via INTRAVENOUS
  Filled 2018-01-12: qty 1

## 2018-01-12 MED ORDER — ENOXAPARIN SODIUM 40 MG/0.4ML ~~LOC~~ SOLN: 40.0000 mg | SUBCUTANEOUS | Status: DC

## 2018-01-12 MED ORDER — ONDANSETRON HCL 4 MG/2ML IJ SOLN
4.0000 mg | Freq: Four times a day (QID) | INTRAMUSCULAR | Status: DC | PRN
  Administered 2018-01-12 – 2018-01-13 (×4): 4 mg via INTRAVENOUS
  Filled 2018-01-12 (×4): qty 2

## 2018-01-12 MED ORDER — ACETAMINOPHEN 650 MG RE SUPP: 650.0000 mg | Freq: Four times a day (QID) | RECTAL | Status: DC | PRN

## 2018-01-12 MED ORDER — SODIUM CHLORIDE 0.9 % IV SOLN: INTRAVENOUS | Status: DC

## 2018-01-12 MED ORDER — FAMOTIDINE IN NACL 20-0.9 MG/50ML-% IV SOLN
20.0000 mg | Freq: Once | INTRAVENOUS | Status: AC
  Administered 2018-01-12: 20 mg via INTRAVENOUS
  Filled 2018-01-12: qty 50

## 2018-01-12 MED ORDER — ALBUTEROL SULFATE (2.5 MG/3ML) 0.083% IN NEBU: 2.5000 mg | INHALATION_SOLUTION | Freq: Four times a day (QID) | RESPIRATORY_TRACT | Status: DC | PRN

## 2018-01-12 MED ORDER — FAMOTIDINE IN NACL 20-0.9 MG/50ML-% IV SOLN
20.0000 mg | Freq: Two times a day (BID) | INTRAVENOUS | Status: DC
  Administered 2018-01-12 – 2018-01-13 (×3): 20 mg via INTRAVENOUS
  Filled 2018-01-12 (×3): qty 50

## 2018-01-12 MED ORDER — FENTANYL CITRATE (PF) 100 MCG/2ML IJ SOLN
25.0000 ug | INTRAMUSCULAR | Status: DC | PRN
  Administered 2018-01-12: 25 ug via INTRAVENOUS
  Filled 2018-01-12: qty 2

## 2018-01-12 MED ORDER — MORPHINE SULFATE (PF) 2 MG/ML IV SOLN
2.0000 mg | INTRAVENOUS | Status: DC | PRN
  Administered 2018-01-12 – 2018-01-13 (×5): 2 mg via INTRAVENOUS
  Filled 2018-01-12 (×5): qty 1

## 2018-01-12 NOTE — ED Triage Notes (Signed)
Pt C/O generalized abdominal pain that began around 1600 yesterday. Pt states she has taken 5 200mg  ibuprofen with no relief. Pt also states she has taken 1 hydrocodone at 2330 with no relief.

## 2018-01-12 NOTE — Progress Notes (Signed)
Patient seen and examined.  Admitted after midnight secondary to nausea, mid epigastric and right upper quadrant pain and blood work with elevated lipase and transaminitis..  Vital signs are stable, patient symptoms improved with current analgesics and antiemetic regimen.  Please refer to H&P written by Dr. [name] for further info/details on admission.   Plan: -Continue IV fluids resuscitation -Continue as needed antiemetics and analgesics -Follow right upper quadrant ultrasound results and if needed in both general surgery. -Follow LFTs and lipase trend. -Continue bowel rest with n.p.o. Status. -Follow electrolytes and replete and as needed.   Barton Dubois MD  [phone]

## 2018-01-12 NOTE — H&P (Signed)
History and Physical    Stacey Cook AVW:[id number] DOB: Mar 24, 1996 DOA: 01/12/2018  PCP: Lynnell Catalan Kyra Manges, MD   Patient coming from: Home   Chief Complaint: Upper abdominal pain, nausea   HPI: Stacey Cook is a 22 y.o. female with medical history significant for depression with anxiety, ADD, and 3 months postpartum, now presenting to the emergency department for evaluation of upper abdominal pain with nausea.  Patient reports that upper abdominal pain developed approximately 2 weeks ago, she was seen in the emergency department at that time with slight elevation in transaminases and lipase, normal right upper quadrant ultrasound, and CT abdomen/pelvis with no acute findings; she was found to have a UTI at that time, completed a course of antibiotics, and pain resolved.  Same upper abdominal pain developed yesterday with severe nausea, pain described as severe, waxing and waning, worse with any oral intake, localized to the epigastrium and right upper quadrant, and not relieved by Norco or Advil.  She denies fevers, chills, diarrhea, or vomiting.  She does not drink alcohol.  Her recent pregnancy was complicated by cholestasis with intractable pruritus, and she was induced for this reason.  ED Course: Upon arrival to the ED, patient is found to be afebrile, saturating well on room air, and with vitals otherwise normal.  Chest x-ray is negative for acute cardiopulmonary disease and KUB with nonobstructive bowel gas pattern.  Chemistry panel is notable for AST 183, ALT 103, and lipase 572.  CBC is unremarkable.  Patient was given 2 L of normal saline, Pepcid, GI cocktail, Zofran, and morphine in the ED.  She took a couple sips of water and had severe exacerbation in pain and nausea despite the IV analgesia and IV antiemetics that have been given.  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  Review of Systems:  All other systems reviewed and  apart from HPI, are negative.  Past Medical History:  Diagnosis Date  . Abdominal pain   . ADD (attention deficit disorder)   . Anginal pain ([place])   . Anxiety   . Arthritis   . Asthma   . 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 Cervicalgia   . Cholestasis during pregnancy   . Chronic abdominal pain   . Complication of anesthesia   . Constipation   . Depression    not on meds  . Ear mass   . Gestational diabetes    pt states not been checking sugars regularly at home; nor has she been taking her Metformin  . Gestational diabetes   . Headache   . HSV infection   . Low iron   . PONV (postoperative nausea and vomiting)   . Suicidal intent   . UTI (lower urinary tract infection) 05/2014  . Vomiting     Past Surgical History:  Procedure Laterality Date  . CHOLESTEATOMA EXCISION    . DILATION AND EVACUATION N/A 09/11/2016   Procedure: DILATATION AND CURRATAGE  2ND TRIMESTER;  Surgeon: Jonnie Kind, MD;  Location: [facility];  Service: Gynecology;  Laterality: N/A;  . IMPLANTATION BONE ANCHORED HEARING AID Right 04/2013  . 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 TYMPANOPLASTY Left   . TYMPANOSTOMY       reports that she has been smoking cigarettes. She has a 0.50 pack-year smoking history. She has never used smokeless tobacco. She reports that she does not drink alcohol or use drugs.  Allergies  Allergen Reactions  . Keflex [Cephalexin] Other (See Comments)    Reaction:  Elevated liver enzymes   . Other Other (See Comments)    Pt is allergic to surgical glue; Cats-congestion, sneezing, eyes watery, itching all over Reaction:  Infection   . Adhesive [Tape] Rash    Family History  Problem Relation Age of Onset  . Cholelithiasis Mother   . Kidney disease Mother        stones  . Depression Mother   . Hypertension Maternal Grandmother   . Diabetes Maternal Grandmother   . Stroke Maternal Grandmother   . Seizures Maternal  Grandmother   . Asthma Maternal Grandmother   . Hyperlipidemia Maternal Grandmother   . Thyroid disease Maternal Grandmother   . Asthma Brother   . Seizures Maternal Uncle   . Cancer Other        breast- great aunt  . Celiac disease Neg Hx      Prior to Admission medications   Medication Sig Start Date End Date Taking? Authorizing Provider  acetaminophen (TYLENOL) 500 MG tablet Take 500 mg by mouth every 6 (six) hours as needed for moderate pain.     [provider]  albuterol (PROVENTIL HFA;VENTOLIN HFA) 108 (90 Base) MCG/ACT inhaler Inhale 1-2 puffs into the lungs every 6 (six) hours as needed for wheezing or shortness of breath.    [provider]  albuterol (PROVENTIL) (2.5 MG/3ML) 0.083% nebulizer solution Take 3 mLs (2.5 mg total) by nebulization every 6 (six) hours as needed for wheezing or shortness of breath. 09/05/16   Virgel Manifold, MD  Atomoxetine HCl (STRATTERA PO) Take by mouth daily.    [provider]  cyclobenzaprine (FLEXERIL) 10 MG tablet Take 1 tablet (10 mg total) by mouth 3 (three) times daily as needed for muscle spasms. 10/30/17   Jonnie Kind, MD  escitalopram (LEXAPRO) 20 MG tablet Take 20 mg by mouth daily.    [provider]  etonogestrel-ethinyl estradiol (NUVARING) 0.12-0.015 MG/24HR vaginal ring Insert vaginally and leave in place for 3 consecutive weeks, then remove for 1 week. 12/05/17   Roma Schanz, CNM  HYDROcodone-acetaminophen (NORCO/VICODIN) 5-325 MG tablet Take 1 tablet by mouth every 4 (four) hours as needed. 12/24/17   Isla Pence, MD  Olopatadine HCl (PATADAY) 0.2 % SOLN Apply 2 drops to eye daily.    [provider]  omeprazole (PRILOSEC) 20 MG capsule Take 1 capsule (20 mg total) by mouth daily. 12/24/17   Rancour, Annie Main, MD  ondansetron (ZOFRAN ODT) 4 MG disintegrating tablet Take 1 tablet (4 mg total) by mouth every 8 (eight) hours as needed. 12/24/17   Isla Pence, MD  Oxcarbazepine  (TRILEPTAL) 300 MG tablet Take 300 mg by mouth 2 (two) times daily.    [provider]  traZODone (DESYREL) 150 MG tablet Take by mouth at bedtime.    [provider]    Physical Exam: Vitals:   01/12/18 0159  BP: 110/62  Pulse: 81  Resp: 18  Temp: 97.7 F (36.5 C)  TempSrc: Oral  SpO2: 97%      Constitutional: NAD, in apparent discomfort Eyes: PERTLA, lids and conjunctivae normal ENMT: Mucous membranes are moist. Posterior pharynx clear of any exudate or lesions.   Neck: normal, supple, no masses, no thyromegaly Respiratory: clear to auscultation bilaterally, no wheezing, no crackles.      Cardiovascular: S1 & S2 heard, regular rate and rhythm. No extremity edema.   Abdomen: No distension, soft, tender  in epigastrium and RUQ without rebound pain or guarding. Bowel sounds active.  Musculoskeletal: no clubbing / cyanosis. No joint deformity upper and lower extremities.  Skin: no significant rashes, lesions, ulcers. Warm, dry, well-perfused. Neurologic: CN 2-12 grossly intact. Sensation intact. Strength 5/5 in all 4 limbs.  Psychiatric: Alert and oriented x 3. Calm, cooperative.     Labs on Admission: I have personally reviewed following labs and imaging studies  CBC: Recent Labs  Lab 01/12/18 0210  WBC 9.4  NEUTROABS 5.9  HGB 12.4  HCT 37.3  MCV 83.1  PLT 470   Basic Metabolic Panel: Recent Labs  Lab 01/12/18 0210  NA 138  K 3.8  CL 107  CO2 25  GLUCOSE 105*  BUN 13  CREATININE 0.74  CALCIUM 9.2   GFR: CrCl cannot be calculated (Unknown ideal weight.). Liver Function Tests: Recent Labs  Lab 01/12/18 0210  AST 183*  ALT 103*  ALKPHOS 148*  BILITOT 0.9  PROT 7.9  ALBUMIN 4.3   Recent Labs  Lab 01/12/18 0210  LIPASE 572*   No results for input(s): AMMONIA in the last 168 hours. Coagulation Profile: No results for input(s): INR, PROTIME in the last 168 hours. Cardiac Enzymes: No results for input(s): CKTOTAL, CKMB,  CKMBINDEX, TROPONINI in the last 168 hours. BNP (last 3 results) No results for input(s): PROBNP in the last 8760 hours. HbA1C: No results for input(s): HGBA1C in the last 72 hours. CBG: No results for input(s): GLUCAP in the last 168 hours. Lipid Profile: No results for input(s): CHOL, HDL, LDLCALC, TRIG, CHOLHDL, LDLDIRECT in the last 72 hours. Thyroid Function Tests: No results for input(s): TSH, T4TOTAL, FREET4, T3FREE, THYROIDAB in the last 72 hours. Anemia Panel: No results for input(s): VITAMINB12, FOLATE, FERRITIN, TIBC, IRON, RETICCTPCT in the last 72 hours. Urine analysis:    Component Value Date/Time   COLORURINE YELLOW 01/12/2018 0210   APPEARANCEUR HAZY (A) 01/12/2018 0210   APPEARANCEUR Clear 04/28/2017 1445   LABSPEC 1.016 01/12/2018 0210   PHURINE 7.0 01/12/2018 0210   GLUCOSEU NEGATIVE 01/12/2018 0210   HGBUR NEGATIVE 01/12/2018 0210   BILIRUBINUR NEGATIVE 01/12/2018 0210   BILIRUBINUR Negative 04/28/2017 1445   KETONESUR NEGATIVE 01/12/2018 0210   PROTEINUR NEGATIVE 01/12/2018 0210   UROBILINOGEN 0.2 04/09/2015 1213   UROBILINOGEN 0.2 01/12/2015 2120   NITRITE NEGATIVE 01/12/2018 0210   LEUKOCYTESUR TRACE (A) 01/12/2018 0210   LEUKOCYTESUR Negative 04/28/2017 1445   Sepsis Labs: @LABRCNTIP (procalcitonin:4,lacticidven:4) )No results found for this or any previous visit (from the past 240 hour(s)).   Radiological Exams on Admission: Dg Abdomen Acute W/chest  Result Date: 01/12/2018 CLINICAL DATA:  Mid abdominal pain extending to the right abdomen since giving birth 3 months ago. Today pain and nausea. EXAM: DG ABDOMEN ACUTE W/ 1V CHEST COMPARISON:  None. FINDINGS: Normal heart size and pulmonary vascularity. No focal airspace disease or consolidation in the lungs. No blunting of costophrenic angles. No pneumothorax. Mediastinal contours appear intact. Scattered gas and stool in the colon. No small or large bowel distention. No free intra-abdominal air. No  abnormal air-fluid levels. No radiopaque stones. Visualized bones appear intact. IMPRESSION: 1. No evidence of active pulmonary disease. 2. Normal nonobstructive bowel gas pattern. Electronically Signed   By: Lucienne Capers M.D.   On: 01/12/2018 03:18    EKG: not performed.   Assessment/Plan  1. Acute pancreatitis   - Presents with recurrent pain in epigastrium and RUQ and is found to have elevated lipase and transaminases  - She  continues to have severe pain and nausea despite aggressive treatment in ED  - She denies alcohol use, did not have gallstones on imaging last month, and is not on any of the more commonly implicated medications; pancreatitis is more common in postpartum period, but this appears to be related to higher incidence of gallstones in this population   - Check triglyceride level and RUQ [name], repeat CMP and lipase tomorrow  - Continue bowel-rest, IVF hydration, analgesia, and antiemetics  2. Asthma  - No wheezing, cough, or SOB   - Continue albuterol as needed    3. Depression; anxiety  - Stable, medications held while NPO    DVT prophylaxis: Lovenox Code Status: Full  Family Communication: Discussed with patient  Consults called: none Admission status: Observation     Vianne Bulls, MD [facility] Pager [phone]  If 7PM-7AM, please contact night-coverage [email or web address] Password [email or web address]  01/12/2018, 4:32 AM

## 2018-01-12 NOTE — ED Provider Notes (Signed)
[facility] EMERGENCY DEPARTMENT Provider Note   CSN: [id number] Arrival date & time: 01/12/18  0150     History   Chief Complaint Chief Complaint  Patient presents with  . Abdominal Pain    HPI Stacey Cook is a 22 y.o. female.  Patient with upper abdominal pain that has been constant since about 4 PM yesterday.  Associated with nausea without vomiting.  She took ibuprofen as well as hydrocodone at home without relief.  The pain is similar to when she was seen in the ED on August 16 and 17.  She had a CT scan and ultrasound at that time that showed no gallstones.  She was treated for UTI with Cipro which she completed.  She denies any fever.  She denies any diarrhea.  No pain with urination or blood in the urine.  No vaginal bleeding or discharge.  She reports compliance with her omeprazole.  Denies any chest pain or shortness of breath.  Patient approximately 3 months postpartum.  The history is provided by the patient.  Abdominal Pain   Associated symptoms include nausea. Pertinent negatives include fever, diarrhea, vomiting, dysuria, hematuria, arthralgias and myalgias.    Past Medical History:  Diagnosis Date  . Abdominal pain   . ADD (attention deficit disorder)   . Anginal pain ([place])   . Anxiety   . Arthritis   . Asthma   . 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 Cervicalgia   . Cholestasis during pregnancy   . Chronic abdominal pain   . Complication of anesthesia   . Constipation   . Depression    not on meds  . Ear mass   . Gestational diabetes    pt states not been checking sugars regularly at home; nor has she been taking her Metformin  . Gestational diabetes   . Headache   . HSV infection   . Low iron   . PONV (postoperative nausea and vomiting)   . Suicidal intent   . UTI (lower urinary tract infection) 05/2014  . Vomiting     Patient Active Problem List   Diagnosis Date Noted  . History of shoulder dystocia in prior  pregnancy 12/02/2017  . History of postpartum hemorrhage 12/02/2017  . Acute blood loss anemia 10/30/2017  . Postpartum anemia 10/29/2017  . History of cholestasis during pregnancy 09/21/2017  . Gestational diabetes mellitus, class A2 08/24/2017  . Chest pain due to GERD 08/05/2017  . Depression with anxiety 06/13/2017  . History of miscarriage 01/18/2017  . Conductive hearing loss, unilat, unrestrict hearing contralateral side 12/10/2015  . Severe episode of recurrent major depressive disorder, without psychotic features ([place])   . 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 Asthma, mild intermittent 02/18/2015  . 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 Perforation of left tympanic membrane 06/19/2014  . Cervicalgia 12/20/2013  . Abdominal pain, chronic, epigastric 08/08/2013  . Acne 08/22/2012  . Syncope 08/16/2012  . GAD (generalized anxiety disorder) 08/01/2012  . ADHD (attention deficit hyperactivity disorder), combined type 08/01/2012  . ODD (oppositional defiant disorder) 08/01/2012  . Vasovagal syncope 02/28/2012  . Cholesteatoma of attic of right ear 02/15/2011    Past Surgical History:  Procedure Laterality Date  . CHOLESTEATOMA EXCISION    . DILATION AND EVACUATION N/A 09/11/2016   Procedure: DILATATION AND CURRATAGE  2ND TRIMESTER;  Surgeon: Jonnie Kind, MD;  Location: [facility];  Service: Gynecology;  Laterality: N/A;  . IMPLANTATION BONE ANCHORED HEARING AID Right 04/2013  . 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 TYMPANOPLASTY Left   . TYMPANOSTOMY       OB History    Gravida  2   Para  1   Term  1   Preterm  0   AB  1   Living  1     SAB  1   TAB  0   Ectopic  0   Multiple  0   Live Births  1            Home Medications    Prior to Admission medications   Medication Sig Start Date End Date Taking?  Authorizing Provider  acetaminophen (TYLENOL) 500 MG tablet Take 500 mg by mouth every 6 (six) hours as needed for moderate pain.     [provider]  albuterol (PROVENTIL HFA;VENTOLIN HFA) 108 (90 Base) MCG/ACT inhaler Inhale 1-2 puffs into the lungs every 6 (six) hours as needed for wheezing or shortness of breath.    [provider]  albuterol (PROVENTIL) (2.5 MG/3ML) 0.083% nebulizer solution Take 3 mLs (2.5 mg total) by nebulization every 6 (six) hours as needed for wheezing or shortness of breath. 09/05/16   Virgel Manifold, MD  Atomoxetine HCl (STRATTERA PO) Take by mouth daily.    [provider]  ciprofloxacin (CIPRO) 500 MG tablet Take 1 tablet (500 mg total) by mouth 2 (two) times daily. 12/24/17   Brodi Kari, Annie Main, MD  cyclobenzaprine (FLEXERIL) 10 MG tablet Take 1 tablet (10 mg total) by mouth 3 (three) times daily as needed for muscle spasms. 10/30/17   Jonnie Kind, MD  escitalopram (LEXAPRO) 20 MG tablet Take 20 mg by mouth daily.    [provider]  etonogestrel-ethinyl estradiol (NUVARING) 0.12-0.015 MG/24HR vaginal ring Insert vaginally and leave in place for 3 consecutive weeks, then remove for 1 week. 12/05/17   Roma Schanz, CNM  HYDROcodone-acetaminophen (NORCO/VICODIN) 5-325 MG tablet Take 1 tablet by mouth every 4 (four) hours as needed. 12/24/17   Isla Pence, MD  Olopatadine HCl (PATADAY) 0.2 % SOLN Apply 2 drops to eye daily.    [provider]  omeprazole (PRILOSEC) 20 MG capsule Take 1 capsule (20 mg total) by mouth daily. 12/24/17   Alexiz Sustaita, Annie Main, MD  ondansetron (ZOFRAN ODT) 4 MG disintegrating tablet Take 1 tablet (4 mg total) by mouth every 8 (eight) hours as needed. 12/24/17   Isla Pence, MD  Oxcarbazepine (TRILEPTAL) 300 MG tablet Take 300 mg by mouth 2 (two) times daily.    [provider]  traZODone (DESYREL) 150 MG tablet Take by mouth at bedtime.    [provider]    Family  History Family History  Problem Relation Age of Onset  . Cholelithiasis Mother   . Kidney disease Mother        stones  . Depression Mother   . Hypertension Maternal Grandmother   . Diabetes Maternal Grandmother   . Stroke Maternal Grandmother   . Seizures Maternal Grandmother   . Asthma Maternal Grandmother   . Hyperlipidemia Maternal Grandmother   . Thyroid disease Maternal Grandmother   . Asthma Brother   . Seizures Maternal Uncle   . Cancer Other        breast- great aunt  . Celiac disease Neg Hx     Social History Social History  Tobacco Use  . Smoking status: Current Every Day Smoker    Packs/day: 0.50    Years: 1.00    Pack years: 0.50    Types: Cigarettes    Last attempt to quit: 03/11/2017    Years since quitting: 0.8  . Smokeless tobacco: Never Used  Substance Use Topics  . Alcohol use: No    Alcohol/week: 0.0 standard drinks  . Drug use: No     Allergies   Keflex [cephalexin]; Other; and Adhesive [tape]   Review of Systems Review of Systems  Constitutional: Negative for activity change, appetite change and fever.  HENT: Negative for congestion and rhinorrhea.   Eyes: Negative for visual disturbance.  Respiratory: Negative for cough and shortness of breath.   Cardiovascular: Negative for chest pain.  Gastrointestinal: Positive for abdominal pain and nausea. Negative for diarrhea and vomiting.  Genitourinary: Negative for dysuria, hematuria, vaginal bleeding and vaginal discharge.  Musculoskeletal: Negative for arthralgias and myalgias.  Neurological: Negative for dizziness, weakness and numbness.    all other systems are negative except as noted in the HPI and PMH.    Physical Exam Updated Vital Signs BP 110/62 (BP Location: Right Arm)   Pulse 81   Temp 97.7 F (36.5 C) (Oral)   Resp 18   SpO2 97%   Physical Exam  Constitutional: She is oriented to person, place, and time. She appears well-developed and well-nourished. No distress.   HENT:  Head: Normocephalic and atraumatic.  Mouth/Throat: Oropharynx is clear and moist. No oropharyngeal exudate.  Eyes: Pupils are equal, round, and reactive to light. Conjunctivae and EOM are normal.  Neck: Normal range of motion. Neck supple.  No meningismus.  Cardiovascular: Normal rate, regular rhythm, normal heart sounds and intact distal pulses.  No murmur heard. Pulmonary/Chest: Effort normal and breath sounds normal. No respiratory distress.  Abdominal: Soft. There is tenderness. There is guarding. There is no rebound.  Obese abdomen, right upper quadrant and epigastric pain with voluntary guarding  Musculoskeletal: Normal range of motion. She exhibits no edema or tenderness.  No CVA tenderness  Neurological: She is alert and oriented to person, place, and time. No cranial nerve deficit. She exhibits normal muscle tone. Coordination normal.   5/5 strength throughout. CN 2-12 intact.Equal grip strength.   Skin: Skin is warm.  Psychiatric: She has a normal mood and affect. Her behavior is normal.  Nursing note and vitals reviewed.    ED Treatments / Results  Labs (all labs ordered are listed, but only abnormal results are displayed) Labs Reviewed  COMPREHENSIVE METABOLIC PANEL - Abnormal; Notable for the following components:      Result Value   Glucose, Bld 105 (*)    AST 183 (*)    ALT 103 (*)    Alkaline Phosphatase 148 (*)    All other components within normal limits  LIPASE, BLOOD - Abnormal; Notable for the following components:   Lipase 572 (*)    All other components within normal limits  URINALYSIS, ROUTINE W REFLEX MICROSCOPIC - Abnormal; Notable for the following components:   APPearance HAZY (*)    Leukocytes, UA TRACE (*)    Bacteria, UA RARE (*)    All other components within normal limits  LACTATE DEHYDROGENASE - Abnormal; Notable for the following components:   LDH 327 (*)    All other components within normal limits  URINE CULTURE  CBC WITH  DIFFERENTIAL/PLATELET  I-STAT BETA HCG BLOOD, ED ([facility], [facility], [facility] ONLY)    EKG None  Radiology Dg Abdomen Acute W/chest  Result Date: 01/12/2018 CLINICAL DATA:  Mid abdominal pain extending to the right abdomen since giving birth 3 months ago. Today pain and nausea. EXAM: DG ABDOMEN ACUTE W/ 1V CHEST COMPARISON:  None. FINDINGS: Normal heart size and pulmonary vascularity. No focal airspace disease or consolidation in the lungs. No blunting of costophrenic angles. No pneumothorax. Mediastinal contours appear intact. Scattered gas and stool in the colon. No small or large bowel distention. No free intra-abdominal air. No abnormal air-fluid levels. No radiopaque stones. Visualized bones appear intact. IMPRESSION: 1. No evidence of active pulmonary disease. 2. Normal nonobstructive bowel gas pattern. Electronically Signed   By: Lucienne Capers M.D.   On: 01/12/2018 03:18    Procedures Procedures (including critical care time)  Medications Ordered in ED Medications  famotidine (PEPCID) IVPB 20 mg premix (20 mg Intravenous New Bag/Given 01/12/18 0236)  gi cocktail (Maalox,Lidocaine,Donnatal) (30 mLs Oral Given 01/12/18 0234)  ondansetron (ZOFRAN) injection 4 mg (4 mg Intravenous Given 01/12/18 0233)  morphine 4 MG/ML injection 4 mg (4 mg Intravenous Given 01/12/18 0235)     Initial Impression / Assessment and Plan / ED Course  I have reviewed the triage vital signs and the nursing notes.  Pertinent labs & imaging results that were available during my care of the patient were reviewed by me and considered in my medical decision making (see chart for details).    Upper abdominal pain similar presentation last month associated with nausea. Has been compliant with PPI.  Patient had CT scan that was unrevealing as well as right upper quadrant ultrasound that showed no gallstones. She was treated for a Klebsiella UTI with Cipro which she completed.  Patient given hydration and pain control.  IV Pepcid  and Zofran.  GI cocktail given.  Labs show mild LFT elevation as well as lipase of 572.  Appears to have pancreatitis of unclear origin.  No gallstones seen on recent imaging.  Denies any significant alcohol use. UA with white blood cells and culture sent.  Pain remains severe after treatment in the ED and patient agreeable to observation admission. Pancreatitis though appears to be mild with Ranson criteria of 0.  Plan admission for symptom control, IV hydration and further evaluation of the etiology of her pancreatitis.  Discussed with Dr. [name].  Final Clinical Impressions(s) / ED Diagnoses   Final diagnoses:  Acute pancreatitis without infection or necrosis, unspecified pancreatitis type    ED Discharge Orders    None       Chelisa Hennen, Annie Main, MD 01/12/18 [phone]

## 2018-01-13 DIAGNOSIS — K219 Gastro-esophageal reflux disease without esophagitis: Secondary | ICD-10-CM

## 2018-01-13 DIAGNOSIS — K851 Biliary acute pancreatitis without necrosis or infection: Principal | ICD-10-CM

## 2018-01-13 DIAGNOSIS — E6609 Other obesity due to excess calories: Secondary | ICD-10-CM

## 2018-01-13 DIAGNOSIS — J4520 Mild intermittent asthma, uncomplicated: Secondary | ICD-10-CM

## 2018-01-13 DIAGNOSIS — F418 Other specified anxiety disorders: Secondary | ICD-10-CM

## 2018-01-13 DIAGNOSIS — Z6837 Body mass index (BMI) 37.0-37.9, adult: Secondary | ICD-10-CM

## 2018-01-13 DIAGNOSIS — R945 Abnormal results of liver function studies: Secondary | ICD-10-CM

## 2018-01-13 LAB — COMPREHENSIVE METABOLIC PANEL
ALT: 77 U/L — AB (ref 0–44)
AST: 53 U/L — AB (ref 15–41)
Albumin: 3.7 g/dL (ref 3.5–5.0)
Alkaline Phosphatase: 126 U/L (ref 38–126)
Anion gap: 7 (ref 5–15)
BUN: 7 mg/dL (ref 6–20)
CO2: 23 mmol/L (ref 22–32)
CREATININE: 0.67 mg/dL (ref 0.44–1.00)
Calcium: 9 mg/dL (ref 8.9–10.3)
Chloride: 108 mmol/L (ref 98–111)
GFR calc Af Amer: >60 mL/min (ref >60)
Glucose, Bld: 101 mg/dL — ABNORMAL HIGH (ref 70–99)
Potassium: 3.8 mmol/L (ref 3.5–5.1)
Sodium: 138 mmol/L (ref 135–145)
Total Bilirubin: 0.4 mg/dL (ref 0.3–1.2)
Total Protein: 7 g/dL (ref 6.5–8.1)

## 2018-01-13 LAB — URINE CULTURE

## 2018-01-13 LAB — CBC
HCT: 37.3 % (ref 36.0–46.0)
Hemoglobin: 11.6 g/dL — ABNORMAL LOW (ref 12.0–15.0)
MCH: 26.3 pg (ref 26.0–34.0)
MCHC: 31.1 g/dL (ref 30.0–36.0)
MCV: 84.6 fL (ref 78.0–100.0)
PLATELETS: 252 10*3/uL (ref 150–400)
RBC: 4.41 MIL/uL (ref 3.87–5.11)
RDW: 15.1 % (ref 11.5–15.5)
WBC: 8.5 10*3/uL (ref 4.0–10.5)

## 2018-01-13 LAB — LIPASE, BLOOD: Lipase: 55 U/L — ABNORMAL HIGH (ref 11–51)

## 2018-01-13 LAB — BILE ACIDS, TOTAL: BILE ACIDS TOTAL: 91.3 umol/L — AB (ref 0.0–10.0)

## 2018-01-13 MED ORDER — PANTOPRAZOLE SODIUM 40 MG PO TBEC
40.0000 mg | DELAYED_RELEASE_TABLET | Freq: Every day | ORAL | Status: DC
  Administered 2018-01-13 – 2018-01-14 (×2): 40 mg via ORAL
  Filled 2018-01-13 (×2): qty 1

## 2018-01-13 MED ORDER — OXYCODONE HCL 5 MG PO TABS
5.0000 mg | ORAL_TABLET | Freq: Four times a day (QID) | ORAL | Status: DC | PRN
  Administered 2018-01-13 – 2018-01-14 (×2): 5 mg via ORAL
  Filled 2018-01-13 (×2): qty 1

## 2018-01-13 MED ORDER — MORPHINE SULFATE (PF) 2 MG/ML IV SOLN
2.0000 mg | Freq: Four times a day (QID) | INTRAVENOUS | Status: DC | PRN
  Administered 2018-01-13: 2 mg via INTRAVENOUS
  Filled 2018-01-13: qty 1

## 2018-01-13 MED ORDER — TRAZODONE HCL 50 MG PO TABS
150.0000 mg | ORAL_TABLET | Freq: Every day | ORAL | Status: DC
  Administered 2018-01-13: 150 mg via ORAL
  Filled 2018-01-13: qty 3

## 2018-01-13 NOTE — Progress Notes (Signed)
Has decided to have surgery on Monday.  Texted Dr. Dyann Kief

## 2018-01-13 NOTE — Consult Note (Signed)
Reason for Consult: Gallstone pancreatitis Referring Physician: Dr. Higinio Roger Stacey Cook is an 22 y.o. female.  HPI: Patient is a 22 year old white female 3 months postpartum who has been in the emergency room several times with epigastric right upper quadrant abdominal pain.  She has had multiple x-rays done in the past.  She presented with elevated liver enzyme tests and an elevated lipase.  Ultrasound gallbladder reveals cholelithiasis.  She was admitted to the hospital for gallstone pancreatitis.  Her numbers today are starting to normalize.  She states her epigastric pain is better.  It is a 2 out of 10.  She is hungry.  She denies any fever, chills, jaundice.  Past Medical History:  Diagnosis Date  . Abdominal pain   . ADD (attention deficit disorder)   . Anginal pain ([place])   . Anxiety   . Arthritis   . Asthma   . 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 Cervicalgia   . Cholestasis during pregnancy   . Chronic abdominal pain   . Complication of anesthesia   . Constipation   . Depression    not on meds  . Ear mass   . Gestational diabetes    pt states not been checking sugars regularly at home; nor has she been taking her Metformin  . Gestational diabetes   . Headache   . HSV infection   . Low iron   . PONV (postoperative nausea and vomiting)   . Suicidal intent   . UTI (lower urinary tract infection) 05/2014  . Vomiting     Past Surgical History:  Procedure Laterality Date  . CHOLESTEATOMA EXCISION    . DILATION AND EVACUATION N/A 09/11/2016   Procedure: DILATATION AND CURRATAGE  2ND TRIMESTER;  Surgeon: Jonnie Kind, MD;  Location: [facility];  Service: Gynecology;  Laterality: N/A;  . IMPLANTATION BONE ANCHORED HEARING AID Right 04/2013  . 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 TYMPANOPLASTY Left   . TYMPANOSTOMY      Family History  Problem Relation Age of Onset  . Cholelithiasis Mother   . Kidney disease Mother     stones  . Depression Mother   . Hypertension Maternal Grandmother   . Diabetes Maternal Grandmother   . Stroke Maternal Grandmother   . Seizures Maternal Grandmother   . Asthma Maternal Grandmother   . Hyperlipidemia Maternal Grandmother   . Thyroid disease Maternal Grandmother   . Asthma Brother   . Seizures Maternal Uncle   . Cancer Other        breast- great aunt  . Celiac disease Neg Hx     Social History:  reports that she has been smoking cigarettes. She has a 0.50 pack-year smoking history. She has never used smokeless tobacco. She reports that she does not drink alcohol or use drugs.  Allergies:  Allergies  Allergen Reactions  . Keflex [Cephalexin] Other (See Comments)    Reaction:  Elevated liver enzymes   . Other Other (See Comments)    Pt is allergic to surgical glue; Cats-congestion, sneezing, eyes watery, itching all over Reaction:  Infection   . Adhesive [Tape] Rash    Medications: I have reviewed the patient's current medications.  Results for orders placed or performed during the hospital encounter of 01/12/18 (from the past 48 hour(s))  CBC with Differential/Platelet     Status: None   Collection Time: 01/12/18  2:10 AM  Result Value Ref Range  WBC 9.4 4.0 - 10.5 K/uL   RBC 4.49 3.87 - 5.11 MIL/uL   Hemoglobin 12.4 12.0 - 15.0 g/dL   HCT 37.3 36.0 - 46.0 %   MCV 83.1 78.0 - 100.0 fL   MCH 27.6 26.0 - 34.0 pg   MCHC 33.2 30.0 - 36.0 g/dL   RDW 14.8 11.5 - 15.5 %   Platelets 256 150 - 400 K/uL   Neutrophils Relative % 62 %   Neutro Abs 5.9 1.7 - 7.7 K/uL   Lymphocytes Relative 23 %   Lymphs Abs 2.2 0.7 - 4.0 K/uL   Monocytes Relative 10 %   Monocytes Absolute 0.9 0.1 - 1.0 K/uL   Eosinophils Relative 4 %   Eosinophils Absolute 0.3 0.0 - 0.7 K/uL   Basophils Relative 1 %   Basophils Absolute 0.1 0.0 - 0.1 K/uL    Comment: Performed at [facility], [address], [place], [place] [address]  Comprehensive metabolic panel     Status: Abnormal    Collection Time: 01/12/18  2:10 AM  Result Value Ref Range   Sodium 138 135 - 145 mmol/L   Potassium 3.8 3.5 - 5.1 mmol/L   Chloride 107 98 - 111 mmol/L   CO2 25 22 - 32 mmol/L   Glucose, Bld 105 (H) 70 - 99 mg/dL   BUN 13 6 - 20 mg/dL   Creatinine, Ser 0.74 0.44 - 1.00 mg/dL   Calcium 9.2 8.9 - 10.3 mg/dL   Total Protein 7.9 6.5 - 8.1 g/dL   Albumin 4.3 3.5 - 5.0 g/dL   AST 183 (H) 15 - 41 U/L   ALT 103 (H) 0 - 44 U/L   Alkaline Phosphatase 148 (H) 38 - 126 U/L   Total Bilirubin 0.9 0.3 - 1.2 mg/dL   GFR calc non Af Amer >60 >60 mL/min   GFR calc Af Amer >60 >60 mL/min    Comment: (NOTE) The eGFR has been calculated using the CKD EPI equation. This calculation has not been validated in all clinical situations. eGFR's persistently <60 mL/min signify possible Chronic Kidney Disease.    Anion gap 6 5 - 15    Comment: Performed at [facility], [address], [place], [place] [address]  Lipase, blood     Status: Abnormal   Collection Time: 01/12/18  2:10 AM  Result Value Ref Range   Lipase 572 (H) 11 - 51 U/L    Comment: RESULTS CONFIRMED BY MANUAL DILUTION Performed at [facility], [address], [place], [place] [address]   Urinalysis, Routine w reflex microscopic     Status: Abnormal   Collection Time: 01/12/18  2:10 AM  Result Value Ref Range   Color, Urine YELLOW YELLOW   APPearance HAZY (A) CLEAR   Specific Gravity, Urine 1.016 1.005 - 1.030   pH 7.0 5.0 - 8.0   Glucose, UA NEGATIVE NEGATIVE mg/dL   Hgb urine dipstick NEGATIVE NEGATIVE   Bilirubin Urine NEGATIVE NEGATIVE   Ketones, ur NEGATIVE NEGATIVE mg/dL   Protein, ur NEGATIVE NEGATIVE mg/dL   Nitrite NEGATIVE NEGATIVE   Leukocytes, UA TRACE (A) NEGATIVE   RBC / HPF 0-5 0 - 5 RBC/hpf   WBC, UA 21-50 0 - 5 WBC/hpf   Bacteria, UA RARE (A) NONE SEEN   Squamous Epithelial / LPF 11-20 0 - 5    Comment: Performed at [facility], [address], [place], [place] [address]  Lactate dehydrogenase     Status:  Abnormal   Collection Time:  01/12/18  2:10 AM  Result Value Ref Range   LDH 327 (H) 98 - 192 U/L    Comment: Performed at [facility], [address], [place], [place] [address]  Triglycerides     Status: Abnormal   Collection Time: 01/12/18  2:10 AM  Result Value Ref Range   Triglycerides 163 (H) <150 mg/dL    Comment: Performed at [facility], [address], [place], [place] [address]  I-Stat Beta hCG blood, ED ([facility], [facility], [facility] only)     Status: None   Collection Time: 01/12/18  2:19 AM  Result Value Ref Range   I-stat hCG, quantitative <5.0 <5 mIU/mL   Comment 3            Comment:   GEST. AGE      CONC.  (mIU/mL)   <=1 WEEK        5 - 50     2 WEEKS       50 - 500     3 WEEKS       100 - 10,000     4 WEEKS     1,000 - 30,000        FEMALE AND NON-PREGNANT FEMALE:     LESS THAN 5 mIU/mL   Comprehensive metabolic panel     Status: Abnormal   Collection Time: 01/13/18  4:46 AM  Result Value Ref Range   Sodium 138 135 - 145 mmol/L   Potassium 3.8 3.5 - 5.1 mmol/L   Chloride 108 98 - 111 mmol/L   CO2 23 22 - 32 mmol/L   Glucose, Bld 101 (H) 70 - 99 mg/dL   BUN 7 6 - 20 mg/dL   Creatinine, Ser 0.67 0.44 - 1.00 mg/dL   Calcium 9.0 8.9 - 10.3 mg/dL   Total Protein 7.0 6.5 - 8.1 g/dL   Albumin 3.7 3.5 - 5.0 g/dL   AST 53 (H) 15 - 41 U/L   ALT 77 (H) 0 - 44 U/L   Alkaline Phosphatase 126 38 - 126 U/L   Total Bilirubin 0.4 0.3 - 1.2 mg/dL   GFR calc non Af Amer >60 >60 mL/min   GFR calc Af Amer >60 >60 mL/min    Comment: (NOTE) The eGFR has been calculated using the CKD EPI equation. This calculation has not been validated in all clinical situations. eGFR's persistently <60 mL/min signify possible Chronic Kidney Disease.    Anion gap 7 5 - 15    Comment: Performed at [facility], [address] [place], [place] [address]  CBC     Status: Abnormal   Collection Time: 01/13/18  4:46 AM  Result Value Ref Range   WBC 8.5 4.0 - 10.5 K/uL   RBC 4.41 3.87 - 5.11 MIL/uL    Hemoglobin 11.6 (L) 12.0 - 15.0 g/dL   HCT 37.3 36.0 - 46.0 %   MCV 84.6 78.0 - 100.0 fL   MCH 26.3 26.0 - 34.0 pg   MCHC 31.1 30.0 - 36.0 g/dL   RDW 15.1 11.5 - 15.5 %   Platelets 252 150 - 400 K/uL    Comment: Performed at [facility], [address], [place], [place] [address]  Lipase, blood     Status: Abnormal   Collection Time: 01/13/18  4:46 AM  Result Value Ref Range   Lipase 55 (H) 11 - 51 U/L    Comment: Performed at [facility], [address] [place], [place] [address]    Dg Abdomen Acute  W/chest  Result Date: 01/12/2018 CLINICAL DATA:  Mid abdominal pain extending to the right abdomen since giving birth 3 months ago. Today pain and nausea. EXAM: DG ABDOMEN ACUTE W/ 1V CHEST COMPARISON:  None. FINDINGS: Normal heart size and pulmonary vascularity. No focal airspace disease or consolidation in the lungs. No blunting of costophrenic angles. No pneumothorax. Mediastinal contours appear intact. Scattered gas and stool in the colon. No small or large bowel distention. No free intra-abdominal air. No abnormal air-fluid levels. No radiopaque stones. Visualized bones appear intact. IMPRESSION: 1. No evidence of active pulmonary disease. 2. Normal nonobstructive bowel gas pattern. Electronically Signed   By: Lucienne Capers M.D.   On: 01/12/2018 03:18   US Abdomen Limited Ruq  Result Date: 01/12/2018 CLINICAL DATA:  Pancreatitis EXAM: ULTRASOUND ABDOMEN LIMITED RIGHT UPPER QUADRANT COMPARISON:  Ultrasound 12/25/2017, CT 12/24/2017 FINDINGS: Gallbladder: Multiple small shadowing stones in the gallbladder measuring up to 5 mm. Normal wall thickness. Negative sonographic Murphy. Common bile duct: Diameter: 2.2 mm Liver: No focal lesion identified. Within normal limits in parenchymal echogenicity. Portal vein is patent on color Doppler imaging with normal direction of blood flow towards the liver. IMPRESSION: Cholelithiasis without sonographic evidence for acute cholecystitis or biliary  dilatation. Electronically Signed   By: Donavan Foil M.D.   On: 01/12/2018 14:23    ROS:  Pertinent items are noted in HPI.  Blood pressure 117/64, pulse 79, temperature 98.2 F (36.8 C), temperature source Oral, resp. rate 18, height '4\' 11"'  (1.499 m), weight 83.4 kg, SpO2 99 %, not currently breastfeeding. Physical Exam: Pleasant white female no acute distress Head is normocephalic, atraumatic Eyes are without scleral icterus Lungs are clear to auscultation with equal breath sounds bilaterally Heart examination reveals regular rate and rhythm without S3, S4, murmurs Abdomen soft with no significant tenderness in the epigastric and right upper quadrant regions.  No hepatosplenomegaly, masses, hernias are identified. Ultrasound report reviewed  Assessment/Plan: Impression: Gallstone pancreatitis, resolving Plan: Patient has been scheduled for laparoscopic cholecystectomy on 01/16/2018.  The risks and benefits of the procedure including bleeding, infection, hepatobiliary injury, and the possibility of an open procedure were fully explained to the patient, who gave informed consent.  Patient may have diet advanced at this time.  Will follow with you.  Aviva Signs 01/13/2018, 11:17 AM

## 2018-01-13 NOTE — Progress Notes (Signed)
PROGRESS NOTE    Stacey Cook  INO:[id number] DOB: Sep 20, 1995 DOA: 01/12/2018 PCP: [name], MD     Brief Narrative:  22 year old female with a past medical history significant for asthma, depression, class II obesity, GERD and 3 months postpartum; who has been admitted due to acute gallstone pancreatitis and transaminitis.  Assessment & Plan: 1-Acute gallstone pancreatitis -No signs of acute cholecystitis, necrosis or infection. -Patient is hungry, with just a very little abdominal discomfort no further vomiting. -Plan is to have her diet advanced and continue supportive care. -General surgery has been consulted and there is anticipated plans for laparoscopic cholecystectomy on 01/16/2018. -lipase and LFT's trending down.  2-Asthma, mild intermittent -no wheezing  -continue PRN albuterol  3-Depression with anxiety -stable mood -no SI or hallucinations  -resume trazodone   4-obesity class 2 -low calorie diet, portion control and increase physical activity discussed with patient. -Body mass index is 37.12 kg/m.  5-GERD -continue PPI  6-transaminitis  -most likely in the setting of passing gallstone -LFT's essentially back to normal after supportive care -no elevated bilirubin and no icterus on exam.  DVT prophylaxis: lovenox  Code Status: Full code Family Communication: No family at bedside. Disposition Plan: remains inpatient for now. Advance diet, continue supportive care, PRN analgesics and antiemetics. Plan is for laparoscopic cholecystectomy on 01/16/18.  Consultants:   General surgery   Procedures:   See below for x-ray reports  Right upper quadrant ultrasound: Positive cholelithiasis without cholecystitis.  Antimicrobials:  Anti-infectives (From admission, onward)   None       Subjective: Afebrile, feeling better and denying any further vomiting.  Still having very mild abdominal discomfort and some nausea.  She is very hungry and would like to  eat something. No jaundice.   Objective: Vitals:   01/12/18 1951 01/12/18 2123 01/13/18 0549 01/13/18 1536  BP:  115/63 117/64 110/65  Pulse:  60 79 77  Resp:    16  Temp:  97.8 F (36.6 C) 98.2 F (36.8 C) 98.3 F (36.8 C)  TempSrc:  Oral Oral Oral  SpO2: 96% 94% 99% 97%  Weight:      Height:        Intake/Output Summary (Last 24 hours) at 01/13/2018 1647 Last data filed at 01/13/2018 1300 Gross per 24 hour  Intake 120 ml  Output 200 ml  Net -80 ml   Filed Weights   01/12/18 0542  Weight: 83.4 kg    Examination: General exam: Alert, awake, oriented x 3; reports just mild nausea, no further vomiting and significant improvement in her abdominal pain.  She is hungry and would like to eat something. Respiratory system: Clear to auscultation. Respiratory effort normal. Cardiovascular system:RRR. No murmurs, rubs, gallops. Gastrointestinal system: Abdomen is nondistended, soft and with just mild tenderness with deep palpation in her mid epigastric area.  No organomegaly or masses felt. Normal bowel sounds heard. Central nervous system: Alert and oriented. No focal neurological deficits. Extremities: No C/C/E, +pedal pulses Skin: No rashes, lesions or ulcers Psychiatry: Judgement and insight appear normal. Mood & affect appropriate.     Data Reviewed: I have personally reviewed following labs and imaging studies  CBC: Recent Labs  Lab 01/12/18 0210 01/13/18 0446  WBC 9.4 8.5  NEUTROABS 5.9  --   HGB 12.4 11.6*  HCT 37.3 37.3  MCV 83.1 84.6  PLT 256 096   Basic Metabolic Panel: Recent Labs  Lab 01/12/18 0210 01/13/18 0446  NA 138 138  K 3.8 3.8  CL 107 108  CO2 25 23  GLUCOSE 105* 101*  BUN 13 7  CREATININE 0.74 0.67  CALCIUM 9.2 9.0   GFR: Estimated Creatinine Clearance: 105.9 mL/min (by C-G formula based on SCr of 0.67 mg/dL).   Liver Function Tests: Recent Labs  Lab 01/12/18 0210 01/13/18 0446  AST 183* 53*  ALT 103* 77*  ALKPHOS 148* 126    BILITOT 0.9 0.4  PROT 7.9 7.0  ALBUMIN 4.3 3.7   Recent Labs  Lab 01/12/18 0210 01/13/18 0446  LIPASE 572* 55*   Lipid Profile: Recent Labs    01/12/18 0210  TRIG 163*   Urine analysis:    Component Value Date/Time   COLORURINE YELLOW 01/12/2018 0210   APPEARANCEUR HAZY (A) 01/12/2018 0210   APPEARANCEUR Clear 04/28/2017 1445   LABSPEC 1.016 01/12/2018 0210   PHURINE 7.0 01/12/2018 0210   GLUCOSEU NEGATIVE 01/12/2018 0210   HGBUR NEGATIVE 01/12/2018 0210   BILIRUBINUR NEGATIVE 01/12/2018 0210   BILIRUBINUR Negative 04/28/2017 1445   KETONESUR NEGATIVE 01/12/2018 0210   PROTEINUR NEGATIVE 01/12/2018 0210   UROBILINOGEN 0.2 04/09/2015 1213   UROBILINOGEN 0.2 01/12/2015 2120   NITRITE NEGATIVE 01/12/2018 0210   LEUKOCYTESUR TRACE (A) 01/12/2018 0210   LEUKOCYTESUR Negative 04/28/2017 1445    Recent Results (from the past 240 hour(s))  Urine Culture     Status: Abnormal   Collection Time: 01/12/18  2:10 AM  Result Value Ref Range Status   Specimen Description   Final    URINE, CLEAN CATCH Performed at [facility], [address] [place], [place] [address]    Special Requests   Final    NONE Performed at [facility], [address] [place], [place] [address]    Culture MULTIPLE SPECIES PRESENT, SUGGEST RECOLLECTION (A)  Final   Report Status 01/13/2018 FINAL  Final     Radiology Studies: Dg Abdomen Acute W/chest  Result Date: 01/12/2018 CLINICAL DATA:  Mid abdominal pain extending to the right abdomen since giving birth 3 months ago. Today pain and nausea. EXAM: DG ABDOMEN ACUTE W/ 1V CHEST COMPARISON:  None. FINDINGS: Normal heart size and pulmonary vascularity. No focal airspace disease or consolidation in the lungs. No blunting of costophrenic angles. No pneumothorax. Mediastinal contours appear intact. Scattered gas and stool in the colon. No small or large bowel distention. No free intra-abdominal air. No abnormal air-fluid levels. No radiopaque stones.  Visualized bones appear intact. IMPRESSION: 1. No evidence of active pulmonary disease. 2. Normal nonobstructive bowel gas pattern. Electronically Signed   By: Lucienne Capers M.D.   On: 01/12/2018 03:18   US Abdomen Limited Ruq  Result Date: 01/12/2018 CLINICAL DATA:  Pancreatitis EXAM: ULTRASOUND ABDOMEN LIMITED RIGHT UPPER QUADRANT COMPARISON:  Ultrasound 12/25/2017, CT 12/24/2017 FINDINGS: Gallbladder: Multiple small shadowing stones in the gallbladder measuring up to 5 mm. Normal wall thickness. Negative sonographic Murphy. Common bile duct: Diameter: 2.2 mm Liver: No focal lesion identified. Within normal limits in parenchymal echogenicity. Portal vein is patent on color Doppler imaging with normal direction of blood flow towards the liver. IMPRESSION: Cholelithiasis without sonographic evidence for acute cholecystitis or biliary dilatation. Electronically Signed   By: Donavan Foil M.D.   On: 01/12/2018 14:23    Scheduled Meds: . enoxaparin (LOVENOX) injection  40 mg Subcutaneous Q24H  . olopatadine  2 drop Both Eyes Daily   Continuous Infusions: . famotidine Stopped (01/13/18 1517)     LOS: 1 day    Time spent: 30 minutes   Barton Dubois,  MD [facility] Pager [phone]  If 7PM-7AM, please contact night-coverage [email or web address] Password [facility] 01/13/2018, 4:47 PM

## 2018-01-14 DIAGNOSIS — K219 Gastro-esophageal reflux disease without esophagitis: Secondary | ICD-10-CM

## 2018-01-14 DIAGNOSIS — E661 Drug-induced obesity: Secondary | ICD-10-CM

## 2018-01-14 DIAGNOSIS — Z6837 Body mass index (BMI) 37.0-37.9, adult: Secondary | ICD-10-CM

## 2018-01-14 MED ORDER — ONDANSETRON 8 MG PO TBDP: 8.0000 mg | ORAL_TABLET | Freq: Three times a day (TID) | ORAL | 0 refills | Status: DC | PRN

## 2018-01-14 MED ORDER — HYDROCODONE-ACETAMINOPHEN 5-325 MG PO TABS: 1.0000 | ORAL_TABLET | Freq: Four times a day (QID) | ORAL | 0 refills | Status: DC | PRN

## 2018-01-14 MED ORDER — OLOPATADINE HCL 0.1 % OP SOLN: 2.0000 [drp] | Freq: Every day | OPHTHALMIC | 3 refills | Status: DC

## 2018-01-14 NOTE — Discharge Summary (Signed)
Physician Discharge Summary  Stacey [name] NFA:[id number] DOB: 1995/10/16 DOA: 01/12/2018  PCP: [name], MD  Admit date: 01/12/2018 Discharge date: 01/14/2018  Time spent: 35 minutes  Recommendations for Outpatient Follow-up:  1. 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  Discharge home with instruction to follow-up with PCP in 2 weeks; and to return on Monday, 01/16/2018 for a scheduled laparoscopic cholecystectomy.  Diet recommendation: Low-fat diet  Filed Weights   01/12/18 0542  Weight: 83.4 kg    History of present illness:  As per H&P written by Dr. Myna [name] on 01/12/2018 22 y.o. female with medical history significant for depression with anxiety, ADD, and 3 months postpartum, now presenting to the emergency department for evaluation of upper abdominal pain with nausea.  Patient reports that upper abdominal pain developed approximately 2 weeks ago, she was seen in the emergency department at that time with slight elevation in transaminases and lipase, normal right upper quadrant ultrasound, and CT abdomen/pelvis with no acute findings; she was found to have a UTI at that time, completed a course of antibiotics, and pain resolved.  Same upper abdominal pain developed yesterday with severe nausea, pain described as severe, waxing and waning, worse with any oral intake, localized to the epigastrium and right upper quadrant, and not relieved by Norco or Advil.  She denies fevers, chills, diarrhea, or vomiting.  She does not drink alcohol.  Her recent pregnancy was complicated by cholestasis with intractable pruritus, and she was induced  for this reason.  ED Course: Upon arrival to the ED, patient is found to be afebrile, saturating well on room air, and with vitals otherwise normal.  Chest x-ray is negative for acute cardiopulmonary disease and KUB with nonobstructive bowel gas pattern.  Chemistry panel is notable for AST 183, ALT 103, and lipase 572.  CBC is unremarkable.  Patient was given 2 L of normal saline, Pepcid, GI cocktail, Zofran, and morphine in the ED.  She took a couple sips of water and had severe exacerbation in pain and nausea despite the IV analgesia and IV antiemetics that have been given.  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  Hospital Course:  1-Acute gallstone pancreatitis -No signs of acute cholecystitis, necrosis or infection. -no nausea, no vomiting and just mild abd discomfort  -Patient diet advanced and tolerated; discharge home with PRN analgesics and antiemetics. Return on Monday 01/16/18 as per General surgery recommendations for laparoscopic cholecystectomy. -lipase and LFT's dow and essentially back to normal at discharge.  2-Asthma, mild intermittent -no wheezing  -continue PRN albuterol  3-Depression with anxiety -stable mood -no SI or hallucinations  -resume trazodone   4-obesity class 2 -low calorie diet, portion control and increase physical activity discussed with patient. -Body mass index is 37.12 kg/m.  5-GERD -continue PPI  6-transaminitis  -most likely in the setting of passing gallstone. -LFT's essentially back to normal after supportive care -no elevated bilirubin and no icterus on exam.   Procedures:  Right upper quadrant ultrasound: Demonstrating cholelithiasis without cholecystitis.  Consultations:  General surgery  Discharge Exam: Vitals:   01/13/18 2203 01/14/18 0526  BP:  114/65 (!) 108/52  Pulse: 64 74  Resp: 20 12  Temp: 98.6 F (37 C) 98 F (36.7 C)  SpO2: 99% 95%    General exam: Alert, awake, oriented x 3; reports just mild abdominal discomfort; no further nausea or vomiting.  Patient tolerating diet without any problems.  Afebrile and in no acute distress.   Respiratory system: Clear to auscultation. Respiratory effort normal. Cardiovascular system:RRR. No murmurs, rubs, gallops. Gastrointestinal system: Abdomen is nondistended, soft and with just mild tenderness with deep palpation in her mid epigastric area.  No organomegaly or masses felt. Normal bowel sounds heard. Central nervous system: Alert and oriented. No focal neurological deficits. Extremities: No C/C/E, +pedal pulses Skin: No rashes, lesions or ulcers Psychiatry: Judgement and insight appear normal. Mood & affect appropriate.     Discharge Instructions   Discharge Instructions    Diet - low sodium heart healthy   Complete by:  As directed    Discharge instructions   Complete by:  As directed    Take medications as prescribed Keep yourself well-hydrated Follow low-fat diet (small amounts at a time) Return on Monday, 01/16/2018 as instructed by general surgery service for laparoscopic cholecystectomy.     Allergies as of 01/14/2018      Reactions   Keflex [cephalexin] Other (See Comments)   Reaction:  Elevated liver enzymes    Other Other (See Comments)   Pt is allergic to surgical glue; Cats-congestion, sneezing, eyes watery, itching all over Reaction:  Infection    Adhesive [tape] Rash      Medication List    STOP taking these medications   ibuprofen 200 MG tablet Commonly known as:  ADVIL,MOTRIN   PATADAY 0.2 % Soln Generic drug:  Olopatadine HCl Replaced by:  olopatadine 0.1 % ophthalmic solution     TAKE these medications   acetaminophen 500 MG tablet Commonly known as:  TYLENOL Take 500 mg by mouth every 6 (six) hours as needed for moderate pain.   albuterol 108 (90 Base) MCG/ACT inhaler Commonly known as:  PROVENTIL HFA;VENTOLIN HFA Inhale 1-2 puffs into  the lungs every 6 (six) hours as needed for wheezing or shortness of breath.   albuterol (2.5 MG/3ML) 0.083% nebulizer solution Commonly known as:  PROVENTIL Take 3 mLs (2.5 mg total) by nebulization every 6 (six) hours as needed for wheezing or shortness of breath.   cyclobenzaprine 10 MG tablet Commonly known as:  FLEXERIL Take 1 tablet (10 mg total) by mouth 3 (three) times daily as needed for muscle spasms.   etonogestrel-ethinyl estradiol 0.12-0.015 MG/24HR vaginal ring Commonly known as:  NUVARING Insert vaginally and leave in place for 3 consecutive weeks, then remove for 1 week.   HYDROcodone-acetaminophen 5-325 MG tablet Commonly known as:  NORCO/VICODIN Take 1 tablet by mouth every 6 (six) hours as needed for severe pain. What changed:    when to take this  reasons to take this   olopatadine 0.1 % ophthalmic solution Commonly known as:  PATANOL Place 2 drops into both eyes daily. Start taking on:  01/15/2018 Replaces:  PATADAY 0.2 % Soln   omeprazole 20 MG capsule Commonly known as:  PRILOSEC Take 1 capsule (20 mg total) by mouth daily.   ondansetron 8 MG disintegrating tablet Commonly known as:  ZOFRAN-ODT Take 1 tablet (8 mg total) by mouth every 8 (eight) hours as needed for nausea or vomiting. What changed:    medication strength  how much to take  reasons to take this  traZODone 150 MG tablet Commonly known as:  DESYREL Take by mouth at bedtime.      Allergies  Allergen Reactions  . Keflex [Cephalexin] Other (See Comments)    Reaction:  Elevated liver enzymes   . Other Other (See Comments)    Pt is allergic to surgical glue; Cats-congestion, sneezing, eyes watery, itching all over Reaction:  Infection   . Adhesive [Tape] Rash   Follow-up Information    [facility] Follow up.   Why:  Come to Day Surgery at 10:45am Monday 01/16/18       McDonell, Kyra Manges, MD. Schedule an appointment as soon as possible for a visit in 2 week(s).    Specialty:  Pediatrics Contact information: [address] [place] [address] [phone]            The results of significant diagnostics from this hospitalization (including imaging, microbiology, ancillary and laboratory) are listed below for reference.    Significant Diagnostic Studies: Ct Abdomen Pelvis W Contrast  Result Date: 12/24/2017 CLINICAL DATA:  Sharp stabbing pain across the upper abdomen. Vomiting and diarrhea. Symptoms began 3 days ago. EXAM: CT ABDOMEN AND PELVIS WITH CONTRAST TECHNIQUE: Multidetector CT imaging of the abdomen and pelvis was performed using the standard protocol following bolus administration of intravenous contrast. CONTRAST:  172mL ISOVUE-300 IOPAMIDOL (ISOVUE-300) INJECTION 61% COMPARISON:  07/05/2017 FINDINGS: Lower chest: Lung bases are clear. Hepatobiliary: No focal liver abnormality is seen. No gallstones, gallbladder wall thickening, or biliary dilatation. Pancreas: Unremarkable. No pancreatic ductal dilatation or surrounding inflammatory changes. Spleen: Normal in size without focal abnormality. Adrenals/Urinary Tract: Adrenal glands are unremarkable. Kidneys are normal, without renal calculi, focal lesion, or hydronephrosis. Bladder is unremarkable. Stomach/Bowel: Stomach is within normal limits. Appendix appears normal. No evidence of bowel wall thickening, distention, or inflammatory changes. Vascular/Lymphatic: No significant vascular findings are present. No enlarged abdominal or pelvic lymph nodes. Reproductive: Uterus and ovaries are not enlarged. Mildly lobular contour of the uterus suggest uterine fibroids. Probable involuting cyst on the left ovary. Small amount of free fluid in the pelvis is likely physiologic. Other: No abdominal wall hernia or abnormality. No abdominopelvic ascites. Musculoskeletal: No acute or significant osseous findings. IMPRESSION: No acute process demonstrated in the abdomen or pelvis. No evidence of bowel  obstruction or inflammation. Involuting cyst in the left ovary with physiologic free fluid in the pelvis. Electronically Signed   By: Lucienne Capers M.D.   On: 12/24/2017 03:42   Dg Abdomen Acute W/chest  Result Date: 01/12/2018 CLINICAL DATA:  Mid abdominal pain extending to the right abdomen since giving birth 3 months ago. Today pain and nausea. EXAM: DG ABDOMEN ACUTE W/ 1V CHEST COMPARISON:  None. FINDINGS: Normal heart size and pulmonary vascularity. No focal airspace disease or consolidation in the lungs. No blunting of costophrenic angles. No pneumothorax. Mediastinal contours appear intact. Scattered gas and stool in the colon. No small or large bowel distention. No free intra-abdominal air. No abnormal air-fluid levels. No radiopaque stones. Visualized bones appear intact. IMPRESSION: 1. No evidence of active pulmonary disease. 2. Normal nonobstructive bowel gas pattern. Electronically Signed   By: Lucienne Capers M.D.   On: 01/12/2018 03:18   US Abdomen Limited Ruq  Result Date: 01/12/2018 CLINICAL DATA:  Pancreatitis EXAM: ULTRASOUND ABDOMEN LIMITED RIGHT UPPER QUADRANT COMPARISON:  Ultrasound 12/25/2017, CT 12/24/2017 FINDINGS: Gallbladder: Multiple small shadowing stones in the gallbladder measuring up to 5 mm. Normal wall thickness. Negative sonographic Murphy. Common bile duct: Diameter: 2.2 mm Liver:  No focal lesion identified. Within normal limits in parenchymal echogenicity. Portal vein is patent on color Doppler imaging with normal direction of blood flow towards the liver. IMPRESSION: Cholelithiasis without sonographic evidence for acute cholecystitis or biliary dilatation. Electronically Signed   By: Donavan Foil M.D.   On: 01/12/2018 14:23   US Abdomen Limited Ruq  Result Date: 12/25/2017 CLINICAL DATA:  Right upper quadrant pain for the past 4 days. EXAM: ULTRASOUND ABDOMEN LIMITED RIGHT UPPER QUADRANT COMPARISON:  CT abdomen pelvis from yesterday. FINDINGS: Gallbladder: No  gallstones or wall thickening visualized. No sonographic Murphy sign noted by sonographer. Common bile duct: Diameter: 4 mm, normal. Liver: No focal lesion identified. Within normal limits in parenchymal echogenicity. Portal vein is patent on color Doppler imaging with normal direction of blood flow towards the liver. IMPRESSION: Normal right upper quadrant ultrasound. Electronically Signed   By: Titus Dubin M.D.   On: 12/25/2017 09:56    Microbiology: Recent Results (from the past 240 hour(s))  Urine Culture     Status: Abnormal   Collection Time: 01/12/18  2:10 AM  Result Value Ref Range Status   Specimen Description   Final    URINE, CLEAN CATCH Performed at [facility], [address], [place], [place] [address]    Special Requests   Final    NONE Performed at [facility], [address] [place], [place] [address]    Culture MULTIPLE SPECIES PRESENT, SUGGEST RECOLLECTION (A)  Final   Report Status 01/13/2018 FINAL  Final     Labs: Basic Metabolic Panel: Recent Labs  Lab 01/12/18 0210 01/13/18 0446  NA 138 138  K 3.8 3.8  CL 107 108  CO2 25 23  GLUCOSE 105* 101*  BUN 13 7  CREATININE 0.74 0.67  CALCIUM 9.2 9.0   Liver Function Tests: Recent Labs  Lab 01/12/18 0210 01/13/18 0446  AST 183* 53*  ALT 103* 77*  ALKPHOS 148* 126  BILITOT 0.9 0.4  PROT 7.9 7.0  ALBUMIN 4.3 3.7   Recent Labs  Lab 01/12/18 0210 01/13/18 0446  LIPASE 572* 55*   CBC: Recent Labs  Lab 01/12/18 0210 01/13/18 0446  WBC 9.4 8.5  NEUTROABS 5.9  --   HGB 12.4 11.6*  HCT 37.3 37.3  MCV 83.1 84.6  PLT 256 252    Signed:  Barton Dubois MD.  [facility] 01/14/2018, 12:52 PM

## 2018-01-14 NOTE — Discharge Instructions (Signed)

## 2018-01-14 NOTE — H&P (Signed)
Reason for Consult: Gallstone pancreatitis  Referring Physician: Dr. [name] [name] is an 22 y.o. female.  HPI: Patient is a 22 year old white female 3 months postpartum who has been in the emergency room several times with epigastric right upper quadrant abdominal pain. She has had multiple x-rays done in the past. She presented with elevated liver enzyme tests and an elevated lipase. Ultrasound gallbladder reveals cholelithiasis. She was admitted to the hospital for gallstone pancreatitis. Her numbers today are starting to normalize. She states her epigastric pain is better. It is a 2 out of 10. She is hungry. She denies any fever, chills, jaundice.      Past Medical History:  Diagnosis Date  . Abdominal pain   . ADD (attention deficit disorder)   . Anginal pain ([place])   . Anxiety   . Arthritis   . Asthma   . 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 Cervicalgia   . Cholestasis during pregnancy   . Chronic abdominal pain   . Complication of anesthesia   . Constipation   . Depression    not on meds  . Ear mass   . Gestational diabetes    pt states not been checking sugars regularly at home; nor has she been taking her Metformin  . Gestational diabetes   . Headache   . HSV infection   . Low iron   . PONV (postoperative nausea and vomiting)   . Suicidal intent   . UTI (lower urinary tract infection) 05/2014  . Vomiting         Past Surgical History:  Procedure Laterality Date  . CHOLESTEATOMA EXCISION    . DILATION AND EVACUATION N/A 09/11/2016   Procedure: DILATATION AND CURRATAGE 2ND TRIMESTER; Surgeon: Jonnie Kind, MD; Location: [facility]; Service: Gynecology; Laterality: N/A;  . IMPLANTATION BONE ANCHORED HEARING AID Right 04/2013  . 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 TYMPANOPLASTY Left   . TYMPANOSTOMY          Family History  Problem Relation Age of Onset  . Cholelithiasis Mother   . Kidney disease Mother    stones  . Depression Mother   . Hypertension Maternal Grandmother   . Diabetes Maternal Grandmother   . Stroke Maternal Grandmother   . Seizures Maternal Grandmother   . Asthma Maternal Grandmother   . Hyperlipidemia Maternal Grandmother   . Thyroid disease Maternal Grandmother   . Asthma Brother   . Seizures Maternal Uncle   . Cancer Other    breast- great aunt  . Celiac disease Neg Hx    Social History: reports that she has been smoking cigarettes. She has a 0.50 pack-year smoking history. She has never used smokeless tobacco. She reports that she does not drink alcohol or use drugs.  Allergies:       Allergies  Allergen Reactions  . Keflex [Cephalexin] Other (See Comments)    Reaction: Elevated liver enzymes   . Other Other (See Comments)    Pt is allergic to surgical glue; Cats-congestion, sneezing, eyes watery, itching all over  Reaction: Infection   . Adhesive [Tape] Rash   Medications: I have reviewed the patient's current medications.  Lab Results Last 48 Hours  Imaging Results (Last 48 hours)     ROS:  Pertinent items are noted in HPI.  Blood pressure 117/64,  pulse 79, temperature 98.2 F (36.8 C), temperature source Oral, resp. rate 18, height 4\' 11"  (1.499 m), weight 83.4 kg, SpO2 99 %, not currently breastfeeding.  Physical Exam: Pleasant white female no acute distress  Head is normocephalic, atraumatic  Eyes are without scleral icterus  Lungs are clear to auscultation with equal breath sounds bilaterally  Heart examination reveals regular rate and rhythm without S3, S4, murmurs  Abdomen soft with no significant tenderness in the epigastric and right upper quadrant regions. No hepatosplenomegaly, masses, hernias are identified.  Ultrasound report reviewed  Assessment/Plan:  Impression: Gallstone pancreatitis, resolving  Plan: Patient has been scheduled for laparoscopic cholecystectomy on 01/16/2018. The risks and benefits of the procedure including bleeding, infection, hepatobiliary injury, and the possibility of an open procedure were fully explained to the patient, who gave informed consent. Patient may have diet advanced at this time. Will follow with you.  Aviva Signs  01/13/2018, 11:17 AM

## 2018-01-14 NOTE — Progress Notes (Signed)
IV removed and discharge instructions reviewed.  To return on Monday for lap cholecystectomy with Dr. Arnoldo Morale.  Scripts given with paperwork.  Husband to drive home

## 2018-01-14 NOTE — Progress Notes (Signed)
  Subjective: Patient denies any abdominal pain, nausea.  Tolerated soft diet well.  Objective: Vital signs in last 24 hours: Temp:  [98 F (36.7 C)-98.6 F (37 C)] 98 F (36.7 C) (09/07 0526) Pulse Rate:  [64-77] 74 (09/07 0526) Resp:  [12-20] 12 (09/07 0526) BP: (108-114)/(52-65) 108/52 (09/07 0526) SpO2:  [95 %-99 %] 95 % (09/07 0526) Last BM Date: 01/12/18  Intake/Output from previous day: 09/06 0701 - 09/07 0700 In: 360 [P.O.:360] Out: -  Intake/Output this shift: No intake/output data recorded.  General appearance: alert, cooperative and no distress GI: soft, non-tender; bowel sounds normal; no masses,  no organomegaly  Lab Results:  Recent Labs    01/12/18 0210 01/13/18 0446  WBC 9.4 8.5  HGB 12.4 11.6*  HCT 37.3 37.3  PLT 256 252   BMET Recent Labs    01/12/18 0210 01/13/18 0446  NA 138 138  K 3.8 3.8  CL 107 108  CO2 25 23  GLUCOSE 105* 101*  BUN 13 7  CREATININE 0.74 0.67  CALCIUM 9.2 9.0   PT/INR No results for input(s): LABPROT, INR in the last 72 hours.  Studies/Results: US Abdomen Limited Ruq  Result Date: 01/12/2018 CLINICAL DATA:  Pancreatitis EXAM: ULTRASOUND ABDOMEN LIMITED RIGHT UPPER QUADRANT COMPARISON:  Ultrasound 12/25/2017, CT 12/24/2017 FINDINGS: Gallbladder: Multiple small shadowing stones in the gallbladder measuring up to 5 mm. Normal wall thickness. Negative sonographic Murphy. Common bile duct: Diameter: 2.2 mm Liver: No focal lesion identified. Within normal limits in parenchymal echogenicity. Portal vein is patent on color Doppler imaging with normal direction of blood flow towards the liver. IMPRESSION: Cholelithiasis without sonographic evidence for acute cholecystitis or biliary dilatation. Electronically Signed   By: Donavan Foil M.D.   On: 01/12/2018 14:23    Anti-infectives: Anti-infectives (From admission, onward)   None      Assessment/Plan: Impression: Gallstone pancreatitis, clinically resolved. Plan: Okay  for discharge from surgery standpoint.  Patient instructed to present back to day surgery on 01/16/2018 at 10:45 AM.  LOS: 2 days    Aviva Signs 01/14/2018

## 2018-01-16 ENCOUNTER — Other Ambulatory Visit: Payer: Self-pay

## 2018-01-16 ENCOUNTER — Ambulatory Visit (HOSPITAL_COMMUNITY)
Admission: RE | Admit: 2018-01-16 | Discharge: 2018-01-16 | Disposition: A | Discharge status: Home / Self Care | Payer: Medicaid Other | Source: Ambulatory Visit | Attending: General Surgery | Admitting: General Surgery

## 2018-01-16 ENCOUNTER — Encounter (HOSPITAL_COMMUNITY)
Admission: RE | Disposition: A | Discharge status: Home / Self Care | Payer: Self-pay | Source: Ambulatory Visit | Attending: General Surgery

## 2018-01-16 ENCOUNTER — Encounter (HOSPITAL_COMMUNITY): Payer: Self-pay | Admitting: *Deleted

## 2018-01-16 ENCOUNTER — Ambulatory Visit (HOSPITAL_COMMUNITY): Payer: Medicaid Other | Admitting: Anesthesiology

## 2018-01-16 DIAGNOSIS — F329 Major depressive disorder, single episode, unspecified: Secondary | ICD-10-CM | POA: Diagnosis not present

## 2018-01-16 DIAGNOSIS — J45909 Unspecified asthma, uncomplicated: Secondary | ICD-10-CM | POA: Insufficient documentation

## 2018-01-16 DIAGNOSIS — Z881 Allergy status to other antibiotic agents status: Secondary | ICD-10-CM | POA: Insufficient documentation

## 2018-01-16 DIAGNOSIS — K851 Biliary acute pancreatitis without necrosis or infection: Secondary | ICD-10-CM

## 2018-01-16 DIAGNOSIS — K8010 Calculus of gallbladder with chronic cholecystitis without obstruction: Secondary | ICD-10-CM | POA: Insufficient documentation

## 2018-01-16 DIAGNOSIS — F17210 Nicotine dependence, cigarettes, uncomplicated: Secondary | ICD-10-CM | POA: Diagnosis not present

## 2018-01-16 DIAGNOSIS — Z888 Allergy status to other drugs, medicaments and biological substances status: Secondary | ICD-10-CM | POA: Diagnosis not present

## 2018-01-16 DIAGNOSIS — M1990 Unspecified osteoarthritis, unspecified site: Secondary | ICD-10-CM | POA: Diagnosis not present

## 2018-01-16 DIAGNOSIS — K219 Gastro-esophageal reflux disease without esophagitis: Secondary | ICD-10-CM | POA: Diagnosis not present

## 2018-01-16 DIAGNOSIS — F419 Anxiety disorder, unspecified: Secondary | ICD-10-CM | POA: Diagnosis not present

## 2018-01-16 HISTORY — PX: CHOLECYSTECTOMY: SHX55

## 2018-01-16 SURGERY — LAPAROSCOPIC CHOLECYSTECTOMY
Anesthesia: General

## 2018-01-16 MED ORDER — FENTANYL CITRATE (PF) 100 MCG/2ML IJ SOLN: 25.0000 ug | INTRAMUSCULAR | Status: DC | PRN

## 2018-01-16 MED ORDER — BUPIVACAINE LIPOSOME 1.3 % IJ SUSP
INTRAMUSCULAR | Status: DC | PRN
  Administered 2018-01-16: 10 mL

## 2018-01-16 MED ORDER — CIPROFLOXACIN IN D5W 400 MG/200ML IV SOLN
400.0000 mg | INTRAVENOUS | Status: AC
  Administered 2018-01-16: 400 mg via INTRAVENOUS

## 2018-01-16 MED ORDER — POVIDONE-IODINE 10 % OINT PACKET
TOPICAL_OINTMENT | CUTANEOUS | Status: DC | PRN
  Administered 2018-01-16: 1 via TOPICAL

## 2018-01-16 MED ORDER — LACTATED RINGERS IV SOLN
INTRAVENOUS | Status: DC | PRN
  Administered 2018-01-16: 11:00:00 via INTRAVENOUS

## 2018-01-16 MED ORDER — HYDROCODONE-ACETAMINOPHEN 7.5-325 MG PO TABS
1.0000 | ORAL_TABLET | Freq: Once | ORAL | Status: AC | PRN
  Administered 2018-01-16: 1 via ORAL
  Filled 2018-01-16: qty 1

## 2018-01-16 MED ORDER — GLYCOPYRROLATE 0.2 MG/ML IJ SOLN
INTRAMUSCULAR | Status: AC
  Filled 2018-01-16: qty 1

## 2018-01-16 MED ORDER — POVIDONE-IODINE 10 % EX OINT
TOPICAL_OINTMENT | CUTANEOUS | Status: AC
  Filled 2018-01-16: qty 1

## 2018-01-16 MED ORDER — MIDAZOLAM HCL 5 MG/5ML IJ SOLN
INTRAMUSCULAR | Status: DC | PRN
  Administered 2018-01-16: 2 mg via INTRAVENOUS

## 2018-01-16 MED ORDER — CHLORHEXIDINE GLUCONATE CLOTH 2 % EX PADS: 6.0000 | MEDICATED_PAD | Freq: Once | CUTANEOUS | Status: DC

## 2018-01-16 MED ORDER — HEMOSTATIC AGENTS (NO CHARGE) OPTIME
TOPICAL | Status: DC | PRN
  Administered 2018-01-16: 1 via TOPICAL

## 2018-01-16 MED ORDER — KETOROLAC TROMETHAMINE 30 MG/ML IJ SOLN
INTRAMUSCULAR | Status: AC
  Filled 2018-01-16: qty 1

## 2018-01-16 MED ORDER — FENTANYL CITRATE (PF) 100 MCG/2ML IJ SOLN
INTRAMUSCULAR | Status: DC | PRN
  Administered 2018-01-16: 50 ug via INTRAVENOUS
  Administered 2018-01-16: 100 ug via INTRAVENOUS
  Administered 2018-01-16 (×2): 50 ug via INTRAVENOUS

## 2018-01-16 MED ORDER — ROCURONIUM BROMIDE 50 MG/5ML IV SOLN
INTRAVENOUS | Status: AC
  Filled 2018-01-16: qty 3

## 2018-01-16 MED ORDER — ROCURONIUM BROMIDE 100 MG/10ML IV SOLN
INTRAVENOUS | Status: DC | PRN
  Administered 2018-01-16: 10 mg via INTRAVENOUS
  Administered 2018-01-16: 40 mg via INTRAVENOUS

## 2018-01-16 MED ORDER — BUPIVACAINE LIPOSOME 1.3 % IJ SUSP
INTRAMUSCULAR | Status: AC
  Filled 2018-01-16: qty 20

## 2018-01-16 MED ORDER — LIDOCAINE HCL (PF) 1 % IJ SOLN
INTRAMUSCULAR | Status: AC
  Filled 2018-01-16: qty 15

## 2018-01-16 MED ORDER — KETOROLAC TROMETHAMINE 30 MG/ML IJ SOLN
30.0000 mg | Freq: Once | INTRAMUSCULAR | Status: AC
  Administered 2018-01-16: 30 mg via INTRAVENOUS

## 2018-01-16 MED ORDER — ONDANSETRON HCL 4 MG/2ML IJ SOLN
INTRAMUSCULAR | Status: AC
  Filled 2018-01-16: qty 2

## 2018-01-16 MED ORDER — SODIUM CHLORIDE 0.9 % IR SOLN
Status: DC | PRN
  Administered 2018-01-16: 1000 mL

## 2018-01-16 MED ORDER — LIDOCAINE HCL 1 % IJ SOLN
INTRAMUSCULAR | Status: DC | PRN
  Administered 2018-01-16: 40 mg via INTRADERMAL

## 2018-01-16 MED ORDER — PROPOFOL 10 MG/ML IV BOLUS
INTRAVENOUS | Status: AC
  Filled 2018-01-16: qty 20

## 2018-01-16 MED ORDER — SUGAMMADEX SODIUM 500 MG/5ML IV SOLN
INTRAVENOUS | Status: DC | PRN
  Administered 2018-01-16: 340 mg via INTRAVENOUS

## 2018-01-16 MED ORDER — PROPOFOL 10 MG/ML IV BOLUS
INTRAVENOUS | Status: DC | PRN
  Administered 2018-01-16: 150 mg via INTRAVENOUS

## 2018-01-16 MED ORDER — HYDROCODONE-ACETAMINOPHEN 5-325 MG PO TABS: 1.0000 | ORAL_TABLET | ORAL | 0 refills | Status: DC | PRN

## 2018-01-16 MED ORDER — CIPROFLOXACIN IN D5W 400 MG/200ML IV SOLN
INTRAVENOUS | Status: AC
  Filled 2018-01-16: qty 200

## 2018-01-16 MED ORDER — SUCCINYLCHOLINE CHLORIDE 20 MG/ML IJ SOLN
INTRAMUSCULAR | Status: AC
  Filled 2018-01-16: qty 2

## 2018-01-16 MED ORDER — ONDANSETRON HCL 4 MG/2ML IJ SOLN
INTRAMUSCULAR | Status: DC | PRN
  Administered 2018-01-16: 4 mg via INTRAVENOUS

## 2018-01-16 MED ORDER — MIDAZOLAM HCL 2 MG/2ML IJ SOLN
INTRAMUSCULAR | Status: AC
  Filled 2018-01-16: qty 2

## 2018-01-16 MED ORDER — FENTANYL CITRATE (PF) 250 MCG/5ML IJ SOLN
INTRAMUSCULAR | Status: AC
  Filled 2018-01-16: qty 5

## 2018-01-16 SURGICAL SUPPLY — 46 items
APPLIER CLIP ROT 10 11.4 M/L (STAPLE) ×3
BAG RETRIEVAL 10 (BASKET) ×1
BAG RETRIEVAL 10MM (BASKET) ×1
CHLORAPREP W/TINT 26ML (MISCELLANEOUS) ×3 IMPLANT
CLIP APPLIE ROT 10 11.4 M/L (STAPLE) ×1 IMPLANT
CLOTH BEACON ORANGE TIMEOUT ST (SAFETY) ×3 IMPLANT
COVER LIGHT HANDLE STERIS (MISCELLANEOUS) ×6 IMPLANT
ELECT REM PT RETURN 9FT ADLT (ELECTROSURGICAL) ×3
ELECTRODE REM PT RTRN 9FT ADLT (ELECTROSURGICAL) ×1 IMPLANT
FILTER SMOKE EVAC LAPAROSHD (FILTER) ×3 IMPLANT
GLOVE BIOGEL PI IND STRL 7.0 (GLOVE) ×1 IMPLANT
GLOVE BIOGEL PI INDICATOR 7.0 (GLOVE) ×2
GLOVE SURG SS PI 7.5 STRL IVOR (GLOVE) ×3 IMPLANT
GOWN STRL REUS W/ TWL XL LVL3 (GOWN DISPOSABLE) ×1 IMPLANT
GOWN STRL REUS W/TWL LRG LVL3 (GOWN DISPOSABLE) ×6 IMPLANT
GOWN STRL REUS W/TWL XL LVL3 (GOWN DISPOSABLE) ×2
HEMOSTAT SNOW SURGICEL 2X4 (HEMOSTASIS) ×3 IMPLANT
INST SET LAPROSCOPIC AP (KITS) ×3 IMPLANT
IV NS IRRIG 3000ML ARTHROMATIC (IV SOLUTION) IMPLANT
KIT TURNOVER KIT A (KITS) ×3 IMPLANT
MANIFOLD NEPTUNE II (INSTRUMENTS) ×3 IMPLANT
NDL HYPO 18GX1.5 BLUNT FILL (NEEDLE) ×1 IMPLANT
NDL INSUFFLATION 14GA 120MM (NEEDLE) ×1 IMPLANT
NEEDLE HYPO 18GX1.5 BLUNT FILL (NEEDLE) IMPLANT
NEEDLE HYPO 22GX1.5 SAFETY (NEEDLE) ×3 IMPLANT
NEEDLE INSUFFLATION 14GA 120MM (NEEDLE) ×3 IMPLANT
NS IRRIG 1000ML POUR BTL (IV SOLUTION) ×3 IMPLANT
PACK LAP CHOLE LZT030E (CUSTOM PROCEDURE TRAY) ×3 IMPLANT
PAD ARMBOARD 7.5X6 YLW CONV (MISCELLANEOUS) ×3 IMPLANT
SET BASIN LINEN APH (SET/KITS/TRAYS/PACK) ×3 IMPLANT
SET TUBE IRRIG SUCTION NO TIP (IRRIGATION / IRRIGATOR) IMPLANT
SLEEVE ENDOPATH XCEL 5M (ENDOMECHANICALS) ×3 IMPLANT
SPONGE GAUZE 2X2 8PLY STER LF (GAUZE/BANDAGES/DRESSINGS) ×1
SPONGE GAUZE 2X2 8PLY STRL LF (GAUZE/BANDAGES/DRESSINGS) ×5 IMPLANT
STAPLER VISISTAT (STAPLE) ×3 IMPLANT
SUT VICRYL 0 UR6 27IN ABS (SUTURE) ×3 IMPLANT
SYR 20CC LL (SYRINGE) ×3 IMPLANT
SYS BAG RETRIEVAL 10MM (BASKET) ×1
SYSTEM BAG RETRIEVAL 10MM (BASKET) ×1 IMPLANT
TROCAR ENDO BLADELESS 11MM (ENDOMECHANICALS) ×3 IMPLANT
TROCAR XCEL NON-BLD 5MMX100MML (ENDOMECHANICALS) ×3 IMPLANT
TROCAR XCEL UNIV SLVE 11M 100M (ENDOMECHANICALS) ×3 IMPLANT
TUBE CONNECTING 12'X1/4 (SUCTIONS) ×1
TUBE CONNECTING 12X1/4 (SUCTIONS) ×2 IMPLANT
TUBING INSUFFLATION (TUBING) ×3 IMPLANT
WARMER LAPAROSCOPE (MISCELLANEOUS) ×3 IMPLANT

## 2018-01-16 NOTE — Anesthesia Postprocedure Evaluation (Signed)
Anesthesia Post Note  Patient: Stacey Cook  Procedure(s) Performed: LAPAROSCOPIC CHOLECYSTECTOMY (N/A )  Patient location during evaluation: Other Anesthesia Type: General Level of consciousness: awake and alert and oriented Pain management: pain level controlled Vital Signs Assessment: post-procedure vital signs reviewed and stable Cardiovascular status: stable Anesthetic complications: no     Last Vitals:  Vitals:   01/16/18 1315 01/16/18 1337  BP: 112/70 (!) 107/55  Pulse:  69  Resp:    Temp:    SpO2:  96%    Last Pain:  Vitals:   01/16/18 1337  TempSrc: Oral  PainSc: 10-Worst pain ever                 ADAMS, AMY A

## 2018-01-16 NOTE — Transfer of Care (Signed)
Immediate Anesthesia Transfer of Care Note  Patient: Stacey Cook  Procedure(s) Performed: LAPAROSCOPIC CHOLECYSTECTOMY (N/A )  Patient Location: PACU  Anesthesia Type:General  Level of Consciousness: awake  Airway & Oxygen Therapy: Patient Spontanous Breathing and Patient connected to nasal cannula oxygen  Post-op Assessment: Report given to RN  Post vital signs: Reviewed and stable  Last Vitals:  Vitals Value Taken Time  BP 108/64 01/16/2018 12:37 PM  Temp    Pulse 80 01/16/2018 12:39 PM  Resp 16 01/16/2018 12:39 PM  SpO2 88 % 01/16/2018 12:39 PM  Vitals shown include unvalidated device data.  Last Pain:  Vitals:   01/16/18 1038  TempSrc: Oral  PainSc: 4       Patients Stated Pain Goal: 7 (59/56/38 7564)  Complications: No apparent anesthesia complications

## 2018-01-16 NOTE — Op Note (Signed)
Patient:  Stacey Cook  DOB:  10-04-1995  MRN:  [id number]   Preop Diagnosis: Gallstone pancreatitis  Postop Diagnosis: Same  Procedure: Laparoscopic cholecystectomy  Surgeon: Aviva Signs, MD  Anes: General endotracheal  Indications: Patient is a 22 year old white female was recently admitted to the hospital for treatment of gallstone pancreatitis.  She now presents for laparoscopic cholecystectomy.  The risks and benefits of the procedure including bleeding, infection, hepatobiliary injury, and the possibility of an open procedure were fully explained to the patient, who gave informed consent.  Procedure note: The patient was placed in the supine position.  After induction of general endotracheal anesthesia, the abdomen was prepped and draped using the usual sterile technique with DuraPrep.  Surgical site confirmation was performed.  The supraumbilical incision was made down to the fascia.  A Veress needle was introduced into the abdominal cavity and confirmation of placement was done using the saline drop test.  The abdomen was then insufflated to 16 mmHg pressure.  An 11 mm trocar was introduced into the abdominal cavity under direct visualization without difficulty.  The patient was placed in reverse Trendelenburg position and an additional 11 mm trocar was placed in the epigastric region and 5 mm trochars were placed the right upper quadrant and right flank regions.  Liver was inspected and noted to be within normal limits.  The gallbladder was retracted in a dynamic fashion in order to provide a critical view of the triangle of Calot.  The cystic duct was first identified.  Its juncture to the infundibulum was fully identified.  Endoclips were placed proximally and distally on the cystic duct, and the cystic duct was divided.  This was likewise done the cystic artery.  The gallbladder was freed away from the gallbladder fossa using Bovie electrocautery.  The gallbladder was delivered to the  epigastric trocar site using an Endo Catch bag.  The gallbladder fossa was inspected and no abnormal bleeding or bile leakage was noted.  Surgicel was placed in the gallbladder fossa.  All fluid and air were then evacuated from the abdominal cavity prior to the removal of the trochars.  All wounds were irrigated with normal saline.  All wounds were injected with Exparel.  All skin incisions were closed using staples.  Betadine ointment and dry sterile dressings were applied.  All tape and needle counts were correct at the end of the procedure.  The patient was extubated in the operating room and transferred to PACU in stable condition.  Complications: None  EBL: Minimal  Specimen: Gallbladder

## 2018-01-16 NOTE — Interval H&P Note (Signed)
History and Physical Interval Note:  01/16/2018 10:56 AM  Stacey Cook  has presented today for surgery, with the diagnosis of gallstone pancreatitis  The various methods of treatment have been discussed with the patient and family. After consideration of risks, benefits and other options for treatment, the patient has consented to  Procedure(s) with comments: LAPAROSCOPIC CHOLECYSTECTOMY (N/A) - per Dr. Arnoldo Morale, pt will most likely go home and he will tell her to arrive at 10:45 - already has labs as a surgical intervention .  The patient's history has been reviewed, patient examined, no change in status, stable for surgery.  I have reviewed the patient's chart and labs.  Questions were answered to the patient's satisfaction.     Aviva Signs

## 2018-01-16 NOTE — Anesthesia Procedure Notes (Signed)
Procedure Name: Intubation Date/Time: 01/16/2018 11:53 AM Performed by: Ollen Bowl, CRNA Pre-anesthesia Checklist: Patient identified, Patient being monitored, Timeout performed, Emergency Drugs available and Suction available Patient Re-evaluated:Patient Re-evaluated prior to induction Oxygen Delivery Method: Circle system utilized Preoxygenation: Pre-oxygenation with 100% oxygen Induction Type: IV induction, Rapid sequence and Cricoid Pressure applied Ventilation: Mask ventilation without difficulty Laryngoscope Size: Mac and 3 Grade View: Grade I Tube type: Oral Tube size: 7.0 mm Number of attempts: 1 Airway Equipment and Method: Stylet Placement Confirmation: ETT inserted through vocal cords under direct vision,  positive ETCO2 and breath sounds checked- equal and bilateral Secured at: 21 cm Tube secured with: Tape Dental Injury: Teeth and Oropharynx as per pre-operative assessment

## 2018-01-16 NOTE — Anesthesia Preprocedure Evaluation (Signed)
Anesthesia Evaluation  Patient identified by MRN, date of birth, ID band Patient awake  General Assessment Comment:She and mother have h/o PONV Pt with h/o mult ear surgeries in the past   Reviewed: Allergy & Precautions, NPO status , Patient's Chart, lab work & pertinent test results  History of Anesthesia Complications (+) PONV and Family history of anesthesia reaction  Airway Mallampati: II  TM Distance: >3 FB Neck ROM: Full    Dental no notable dental hx. (+) Teeth Intact   Pulmonary neg pulmonary ROS, asthma , Current Smoker,  Smoker - states no inhalers for over a month  Did smoke today States last asthma hosp was ~6 years ago    Pulmonary exam normal breath sounds clear to auscultation       Cardiovascular Exercise Tolerance: Good (-) anginanegative cardio ROS Normal cardiovascular examI Rhythm:Regular Rate:Normal  Denied any chest pain when questioned    Neuro/Psych  Headaches, Anxiety Depression negative neurological ROS  negative psych ROS   GI/Hepatic negative GI ROS, Neg liver ROS, GERD  Medicated and Controlled,  Endo/Other  negative endocrine ROSdiabetes  Renal/GU negative Renal ROS  negative genitourinary   Musculoskeletal negative musculoskeletal ROS (+) Arthritis , Osteoarthritis,    Abdominal   Peds negative pediatric ROS (+)  Hematology negative hematology ROS (+) anemia ,   Anesthesia Other Findings   Reproductive/Obstetrics negative OB ROS                             Anesthesia Physical Anesthesia Plan  ASA: II  Anesthesia Plan: General   Post-op Pain Management:    Induction: Intravenous  PONV Risk Score and Plan:   Airway Management Planned: Oral ETT  Additional Equipment:   Intra-op Plan:   Post-operative Plan: Extubation in OR  Informed Consent: I have reviewed the patients History and Physical, chart, labs and discussed the procedure including  the risks, benefits and alternatives for the proposed anesthesia with the patient or authorized representative who has indicated his/her understanding and acceptance.   Dental advisory given  Plan Discussed with: CRNA  Anesthesia Plan Comments:         Anesthesia Quick Evaluation

## 2018-01-16 NOTE — H&P (Signed)
Reason for Consult: Gallstone pancreatitis  Referring Physician: Dr. Higinio Roger Stacey Cook is an 22 y.o. female.  HPI: Patient is a 22 year old white female 3 months postpartum who has been in the emergency room several times with epigastric right upper quadrant abdominal pain. She has had multiple x-rays done in the past. She presented with elevated liver enzyme tests and an elevated lipase. Ultrasound gallbladder reveals cholelithiasis. She was admitted to the hospital for gallstone pancreatitis. Her numbers today are starting to normalize. She states her epigastric pain is better. It is a 2 out of 10. She is hungry. She denies any fever, chills, jaundice.      Past Medical History:  Diagnosis Date  . Abdominal pain   . ADD (attention deficit disorder)   . Anginal pain ([place])   . Anxiety   . Arthritis   . Asthma   . 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 Cervicalgia   . Cholestasis during pregnancy   . Chronic abdominal pain   . Complication of anesthesia   . Constipation   . Depression    not on meds  . Ear mass   . Gestational diabetes    pt states not been checking sugars regularly at home; nor has she been taking her Metformin  . Gestational diabetes   . Headache   . HSV infection   . Low iron   . PONV (postoperative nausea and vomiting)   . Suicidal intent   . UTI (lower urinary tract infection) 05/2014  . Vomiting         Past Surgical History:  Procedure Laterality Date  . CHOLESTEATOMA EXCISION    . DILATION AND EVACUATION N/A 09/11/2016   Procedure: DILATATION AND CURRATAGE 2ND TRIMESTER; Surgeon: Jonnie Kind, MD; Location: [facility]; Service: Gynecology; Laterality: N/A;  . IMPLANTATION BONE ANCHORED HEARING AID Right 04/2013  . 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 TYMPANOPLASTY Left   . TYMPANOSTOMY          Family History  Problem Relation Age of Onset  . Cholelithiasis Mother   . Kidney disease Mother    stones  . Depression Mother   . Hypertension Maternal Grandmother   . Diabetes Maternal Grandmother   . Stroke Maternal Grandmother   . Seizures Maternal Grandmother   . Asthma Maternal Grandmother   . Hyperlipidemia Maternal Grandmother   . Thyroid disease Maternal Grandmother   . Asthma Brother   . Seizures Maternal Uncle   . Cancer Other    breast- great aunt  . Celiac disease Neg Hx    Social History: reports that she has been smoking cigarettes. She has a 0.50 pack-year smoking history. She has never used smokeless tobacco. She reports that she does not drink alcohol or use drugs.  Allergies:       Allergies  Allergen Reactions  . Keflex [Cephalexin] Other (See Comments)    Reaction: Elevated liver enzymes   . Other Other (See Comments)    Pt is allergic to surgical glue; Cats-congestion, sneezing, eyes watery, itching all over  Reaction: Infection   . Adhesive [Tape] Rash   Medications: I have reviewed the patient's current medications.  Lab Results Last 48 Hours  Imaging Results (Last 48 hours)     ROS:  Pertinent items are noted in HPI.  Blood pressure 117/64,  pulse 79, temperature 98.2 F (36.8 C), temperature source Oral, resp. rate 18, height 4\' 11"  (1.499 m), weight 83.4 kg, SpO2 99 %, not currently breastfeeding.  Physical Exam: Pleasant white female no acute distress  Head is normocephalic, atraumatic  Eyes are without scleral icterus  Lungs are clear to auscultation with equal breath sounds bilaterally  Heart examination reveals regular rate and rhythm without S3, S4, murmurs  Abdomen soft with no significant tenderness in the epigastric and right upper quadrant regions. No hepatosplenomegaly, masses, hernias are identified.  Ultrasound report reviewed  Assessment/Plan:  Impression: Gallstone pancreatitis, resolving  Plan: Patient has been scheduled for laparoscopic cholecystectomy on 01/16/2018. The risks and benefits of the procedure including bleeding, infection, hepatobiliary injury, and the possibility of an open procedure were fully explained to the patient, who gave informed consent. Patient may have diet advanced at this time. Will follow with you.  Stacey Cook  01/13/2018, 11:17 AM

## 2018-01-16 NOTE — Discharge Instructions (Signed)
Laparoscopic Cholecystectomy, Care After °This sheet gives you information about how to care for yourself after your procedure. Your health care provider may also give you more specific instructions. If you have problems or questions, contact your health care provider. °What can I expect after the procedure? °After the procedure, it is common to have: °· Pain at your incision sites. You will be given medicines to control this pain. °· Mild nausea or vomiting. °· Bloating and possible shoulder pain from the air-like gas that was used during the procedure. ° °Follow these instructions at home: °Incision care ° °· Follow instructions from your health care provider about how to take care of your incisions. Make sure you: °? Wash your hands with soap and water before you change your bandage (dressing). If soap and water are not available, use hand sanitizer. °? Change your dressing as told by your health care provider. °? Leave stitches (sutures), skin glue, or adhesive strips in place. These skin closures may need to be in place for 2 weeks or longer. If adhesive strip edges start to loosen and curl up, you may trim the loose edges. Do not remove adhesive strips completely unless your health care provider tells you to do that. °· Do not take baths, swim, or use a hot tub until your health care provider approves. Ask your health care provider if you can take showers. You may only be allowed to take sponge baths for bathing. °· Check your incision area every day for signs of infection. Check for: °? More redness, swelling, or pain. °? More fluid or blood. °? Warmth. °? Pus or a bad smell. °Activity °· Do not drive or use heavy machinery while taking prescription pain medicine. °· Do not lift anything that is heavier than 10 lb (4.5 kg) until your health care provider approves. °· Do not play contact sports until your health care provider approves. °· Do not drive for 24 hours if you were given a medicine to help you relax  (sedative). °· Rest as needed. Do not return to work or school until your health care provider approves. °General instructions °· Take over-the-counter and prescription medicines only as told by your health care provider. °· To prevent or treat constipation while you are taking prescription pain medicine, your health care provider may recommend that you: °? Drink enough fluid to keep your urine clear or pale yellow. °? Take over-the-counter or prescription medicines. °? Eat foods that are high in fiber, such as fresh fruits and vegetables, whole grains, and beans. °? Limit foods that are high in fat and processed sugars, such as fried and sweet foods. °Contact a health care provider if: °· You develop a rash. °· You have more redness, swelling, or pain around your incisions. °· You have more fluid or blood coming from your incisions. °· Your incisions feel warm to the touch. °· You have pus or a bad smell coming from your incisions. °· You have a fever. °· One or more of your incisions breaks open. °Get help right away if: °· You have trouble breathing. °· You have chest pain. °· You have increasing pain in your shoulders. °· You faint or feel dizzy when you stand. °· You have severe pain in your abdomen. °· You have nausea or vomiting that lasts for more than one day. °· You have leg pain. °This information is not intended to replace advice given to you by your health care provider. Make sure you discuss any questions you   have with your health care provider. °Document Released: 04/26/2005 Document Revised: 11/15/2015 Document Reviewed: 10/13/2015 °Elsevier Interactive Patient Education © 2018 [employer] ° °

## 2018-01-17 ENCOUNTER — Encounter (HOSPITAL_COMMUNITY): Payer: Self-pay | Admitting: General Surgery

## 2018-01-24 ENCOUNTER — Encounter: Payer: Self-pay | Admitting: General Surgery

## 2018-01-24 ENCOUNTER — Ambulatory Visit (INDEPENDENT_AMBULATORY_CARE_PROVIDER_SITE_OTHER): Payer: Self-pay | Admitting: General Surgery

## 2018-01-24 VITALS — BP 122/60 | HR 73 | Temp 97.1°F | Resp 18 | Wt 174.0 lb

## 2018-01-24 DIAGNOSIS — Z09 Encounter for follow-up examination after completed treatment for conditions other than malignant neoplasm: Secondary | ICD-10-CM

## 2018-01-24 NOTE — Progress Notes (Signed)
Subjective:     Stacey Cook  Status post laparoscopic cholecystectomy.  Doing well.  Has no complaints. Objective:    BP 122/60 (BP Location: Left Arm, Patient Position: Sitting, Cuff Size: Normal)   Pulse 73   Temp (!) 97.1 F (36.2 C) (Temporal)   Resp 18   Wt 174 lb (78.9 kg)   LMP 01/06/2018   BMI 35.14 kg/m   General:  alert, cooperative and no distress  Abdomen soft, incisions healing well.  Staples removed, Steri-Strips applied. Final pathology consistent with diagnosis.     Assessment:    Doing well postoperatively.    Plan:   May return to normal activity.  Follow-up as needed.

## 2018-02-15 ENCOUNTER — Emergency Department (HOSPITAL_COMMUNITY): Payer: Medicaid Other

## 2018-02-15 ENCOUNTER — Other Ambulatory Visit: Payer: Self-pay

## 2018-02-15 ENCOUNTER — Emergency Department (HOSPITAL_COMMUNITY)
Admission: EM | Admit: 2018-02-15 | Discharge: 2018-02-15 | Disposition: A | Discharge status: Home / Self Care | Payer: Medicaid Other | Attending: Emergency Medicine | Admitting: Emergency Medicine

## 2018-02-15 ENCOUNTER — Encounter (HOSPITAL_COMMUNITY): Payer: Self-pay | Admitting: *Deleted

## 2018-02-15 DIAGNOSIS — Y999 Unspecified external cause status: Secondary | ICD-10-CM | POA: Diagnosis not present

## 2018-02-15 DIAGNOSIS — S99911A Unspecified injury of right ankle, initial encounter: Secondary | ICD-10-CM | POA: Diagnosis present

## 2018-02-15 DIAGNOSIS — S93401A Sprain of unspecified ligament of right ankle, initial encounter: Secondary | ICD-10-CM | POA: Insufficient documentation

## 2018-02-15 DIAGNOSIS — Y9389 Activity, other specified: Secondary | ICD-10-CM | POA: Insufficient documentation

## 2018-02-15 DIAGNOSIS — X509XXA Other and unspecified overexertion or strenuous movements or postures, initial encounter: Secondary | ICD-10-CM | POA: Insufficient documentation

## 2018-02-15 DIAGNOSIS — F17210 Nicotine dependence, cigarettes, uncomplicated: Secondary | ICD-10-CM | POA: Insufficient documentation

## 2018-02-15 DIAGNOSIS — F909 Attention-deficit hyperactivity disorder, unspecified type: Secondary | ICD-10-CM | POA: Insufficient documentation

## 2018-02-15 DIAGNOSIS — Y92009 Unspecified place in unspecified non-institutional (private) residence as the place of occurrence of the external cause: Secondary | ICD-10-CM | POA: Diagnosis not present

## 2018-02-15 MED ORDER — ACETAMINOPHEN 325 MG PO TABS
650.0000 mg | ORAL_TABLET | Freq: Once | ORAL | Status: AC
  Administered 2018-02-15: 650 mg via ORAL
  Filled 2018-02-15: qty 2

## 2018-02-15 MED ORDER — IBUPROFEN 400 MG PO TABS
400.0000 mg | ORAL_TABLET | Freq: Once | ORAL | Status: AC
  Administered 2018-02-15: 400 mg via ORAL
  Filled 2018-02-15: qty 1

## 2018-02-15 NOTE — Discharge Instructions (Signed)
Your x-ray is negative for new fracture.  There is a question of an old fracture with poor union present at the fibula area.  Please use the boot when you are up and about.  Please elevate your leg above your heart when you are lying down and above your waist when you are sitting.  Use ibuprofen 600mg  every 6 hours, or Tylenol extra strength every 4 hours as needed for pain.  Please call Dr. Aline Brochure for orthopedic evaluation concerning your ankle.

## 2018-02-15 NOTE — ED Provider Notes (Signed)
[facility] EMERGENCY DEPARTMENT Provider Note   CSN: [id number] Arrival date & time: 02/15/18  1408     History   Chief Complaint Chief Complaint  Patient presents with  . Foot Injury    HPI Stacey Cook is a 22 y.o. female.  Pt reports stepping off the back of a truck todayand injuring the right foot and ankle.  The history is provided by the patient.  Foot Injury   The incident occurred 3 to 5 hours ago. The incident occurred at home. Injury mechanism: stepped off the back of a truck and injured the right foot. The pain is present in the right knee and right ankle. The quality of the pain is described as throbbing. The pain is moderate. The pain has been constant since onset. Associated symptoms include numbness, inability to bear weight and loss of motion. The symptoms are aggravated by bearing weight. She has tried nothing for the symptoms.    Past Medical History:  Diagnosis Date  . Abdominal pain   . ADD (attention deficit disorder)   . Anginal pain ([place])   . Anxiety   . Arthritis   . Asthma   . 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 Cervicalgia   . Cholestasis during pregnancy   . Chronic abdominal pain   . Complication of anesthesia    light headed  . Constipation   . Depression    not on meds  . Ear mass   . Gestational diabetes    pt states not been checking sugars regularly at home; nor has she been taking her Metformin  . Gestational diabetes   . Headache   . HSV infection   . Low iron   . PONV (postoperative nausea and vomiting)   . Suicidal intent   . UTI (lower urinary tract infection) 05/2014  . Vomiting     Patient Active Problem List   Diagnosis Date Noted  . 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 Gastroesophageal reflux disease   . Acute gallstone pancreatitis 01/12/2018  . Pancreatitis 01/12/2018  . History of shoulder dystocia in  prior pregnancy 12/02/2017  . History of postpartum hemorrhage 12/02/2017  . Acute blood loss anemia 10/30/2017  . Postpartum anemia 10/29/2017  . History of cholestasis during pregnancy 09/21/2017  . Gestational diabetes mellitus, class A2 08/24/2017  . Chest pain due to GERD 08/05/2017  . Depression with anxiety 06/13/2017  . History of miscarriage 01/18/2017  . Conductive hearing loss, unilat, unrestrict hearing contralateral side 12/10/2015  . Severe episode of recurrent major depressive disorder, without psychotic features ([place])   . 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 Asthma, mild intermittent 02/18/2015  . 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 Perforation of left tympanic membrane 06/19/2014  . Cervicalgia 12/20/2013  . Abdominal pain, chronic, epigastric 08/08/2013  . Acne 08/22/2012  . GAD (generalized anxiety disorder) 08/01/2012  . ADHD (attention deficit hyperactivity disorder), combined type 08/01/2012  . ODD (oppositional defiant disorder) 08/01/2012  . Vasovagal syncope 02/28/2012  . Cholesteatoma of attic of right ear 02/15/2011    Past Surgical History:  Procedure Laterality Date  . CHOLECYSTECTOMY N/A 01/16/2018   Procedure: LAPAROSCOPIC CHOLECYSTECTOMY;  Surgeon: Aviva Signs, MD;  Location: [facility];  Service: General;  Laterality: N/A;  per Dr. Arnoldo Morale, pt  will most likely go home and he will tell her to arrive at 10:45 - already has labs  . CHOLESTEATOMA EXCISION    . DILATION AND EVACUATION N/A 09/11/2016   Procedure: DILATATION AND CURRATAGE  2ND TRIMESTER;  Surgeon: Jonnie Kind, MD;  Location: [facility];  Service: Gynecology;  Laterality: N/A;  . IMPLANTATION BONE ANCHORED HEARING AID Right 04/2013  . 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 TYMPANOPLASTY Left   . TYMPANOSTOMY       OB History    Gravida  2    Para  1   Term  1   Preterm  0   AB  1   Living  1     SAB  1   TAB  0   Ectopic  0   Multiple  0   Live Births  1            Home Medications    Prior to Admission medications   Medication Sig Start Date End Date Taking? Authorizing Provider  HYDROcodone-acetaminophen (NORCO) 5-325 MG tablet Take 1 tablet by mouth every 4 (four) hours as needed for moderate pain. 01/16/18   Aviva Signs, MD    Family History Family History  Problem Relation Age of Onset  . Cholelithiasis Mother   . Kidney disease Mother        stones  . Depression Mother   . Hypertension Maternal Grandmother   . Diabetes Maternal Grandmother   . Stroke Maternal Grandmother   . Seizures Maternal Grandmother   . Asthma Maternal Grandmother   . Hyperlipidemia Maternal Grandmother   . Thyroid disease Maternal Grandmother   . Asthma Brother   . Seizures Maternal Uncle   . Cancer Other        breast- great aunt  . Celiac disease Neg Hx     Social History Social History   Tobacco Use  . Smoking status: Current Every Day Smoker    Packs/day: 0.50    Years: 1.00    Pack years: 0.50    Types: Cigarettes    Last attempt to quit: 03/11/2017    Years since quitting: 0.9  . Smokeless tobacco: Never Used  Substance Use Topics  . Alcohol use: No    Alcohol/week: 0.0 standard drinks  . Drug use: No     Allergies   Keflex [cephalexin]; Other; and Adhesive [tape]   Review of Systems Review of Systems  Constitutional: Negative for activity change.       All ROS Neg except as noted in HPI  HENT: Negative for nosebleeds.   Eyes: Negative for photophobia and discharge.  Respiratory: Negative for cough, shortness of breath and wheezing.   Cardiovascular: Negative for chest pain and palpitations.  Gastrointestinal: Negative for abdominal pain and blood in stool.  Genitourinary: Negative for dysuria, frequency and hematuria.  Musculoskeletal: Positive for arthralgias. Negative for back  pain and neck pain.  Skin: Negative.   Neurological: Positive for numbness. Negative for dizziness, seizures and speech difficulty.  Psychiatric/Behavioral: Negative for confusion and hallucinations.     Physical Exam Updated Vital Signs BP 115/85   Pulse 94   Temp 97.8 F (36.6 C) (Oral)   Resp 16   Ht 4\' 11"  (1.499 m)   Wt 80.3 kg   LMP 02/14/2018   SpO2 98%   Breastfeeding? No   BMI 35.75 kg/m   Physical Exam  Constitutional: She is oriented to person, place, and  time. She appears well-developed and well-nourished.  Non-toxic appearance.  HENT:  Head: Normocephalic.  Right Ear: Tympanic membrane and external ear normal.  Left Ear: Tympanic membrane and external ear normal.  Eyes: Pupils are equal, round, and reactive to light. EOM and lids are normal.  Neck: Normal range of motion. Neck supple. Carotid bruit is not present.  Cardiovascular: Normal rate, regular rhythm, normal heart sounds, intact distal pulses and normal pulses.  Pulmonary/Chest: Breath sounds normal. No respiratory distress.  Abdominal: Soft. Bowel sounds are normal. There is no tenderness. There is no guarding.  Musculoskeletal:       Right ankle: She exhibits decreased range of motion. Tenderness. Lateral malleolus tenderness found. Achilles tendon normal.       Feet:  Lymphadenopathy:       Head (right side): No submandibular adenopathy present.       Head (left side): No submandibular adenopathy present.    She has no cervical adenopathy.  Neurological: She is alert and oriented to person, place, and time. She has normal strength. No cranial nerve deficit or sensory deficit.  Skin: Skin is warm and dry.  Psychiatric: She has a normal mood and affect. Her speech is normal.  Nursing note and vitals reviewed.    ED Treatments / Results  Labs (all labs ordered are listed, but only abnormal results are displayed) Labs Reviewed - No data to display  EKG None  Radiology Dg Ankle Complete  Right  Result Date: 02/15/2018 CLINICAL DATA:  Recent twisting injury with ankle pain, initial encounter EXAM: RIGHT ANKLE - COMPLETE 3+ VIEW COMPARISON:  03/02/2017 FINDINGS: Generalized soft tissue swelling is noted worst laterally. A well corticated bony density is noted adjacent to the distal fibula consistent with prior trauma and nonunion or accessory ossicle. The overall appearance is stable from the prior exam. No acute fracture or dislocation is noted. No other focal abnormality is seen. IMPRESSION: Soft tissue swelling laterally without acute bony abnormality. Electronically Signed   By: Inez Catalina M.D.   On: 02/15/2018 15:07   Dg Foot Complete Right  Result Date: 02/15/2018 CLINICAL DATA:  Pain following inversion injury EXAM: RIGHT FOOT COMPLETE - 3+ VIEW COMPARISON:  None. FINDINGS: Frontal, oblique, and lateral views obtained. There is no fracture or dislocation. Joint spaces appear normal. No erosive change. IMPRESSION: No fracture or dislocation.  No appreciable arthropathy. Electronically Signed   By: Lowella Grip III M.D.   On: 02/15/2018 15:08    Procedures Procedures (including critical care time)  Medications Ordered in ED Medications - No data to display   Initial Impression / Assessment and Plan / ED Course  I have reviewed the triage vital signs and the nursing notes.  Pertinent labs & imaging results that were available during my care of the patient were reviewed by me and considered in my medical decision making (see chart for details).       Final Clinical Impressions(s) / ED Diagnoses MDM  Vital signs are within normal limits.  Pulse oximetry is 98% on room air.  Within normal limits by my interpretation.  The x-ray of the foot and ankle are negative for new fracture or dislocation.  No neurovascular deficits appreciated.  There is question of an old fracture of the distal fibula with malunion.  Ankle stirrup splint offered to the patient.  The  patient states she has weak ankles and she would like to use a cam walker if possible.  CAM Walker ordered.  The patient will  use ibuprofen every 6 hours or Tylenol every 4 hours for soreness.  Patient is referred to Dr. Aline Brochure for orthopedic evaluation and management.   Final diagnoses:  Sprain of right ankle, unspecified ligament, initial encounter    ED Discharge Orders    None       Lily Kocher, PA-C 02/16/18 Jenner, [place], DO 02/16/18 2232

## 2018-02-15 NOTE — ED Triage Notes (Addendum)
Pt c/o right foot and ankle pain after stepping off the back of a truck today. Pt reports she stepped off wrong and "lost her footing" and landed on her right foot really hard. Pt c/o pain and numbness to right foot and to right lower leg. Denies LOC.

## 2018-02-16 ENCOUNTER — Telehealth: Payer: Self-pay | Admitting: Orthopedic Surgery

## 2018-02-16 NOTE — Telephone Encounter (Signed)
Patient called for a ER follow up appointment for her ankle. She has Medicaid for insurance and I explained we would need to get authorization from her PCP before we could schedule an appointment. She stated she understood and would contact them and get them to send me the needed information.

## 2018-02-20 NOTE — Telephone Encounter (Signed)
Received referral notes from [facility] on [address]. I have called her and left a message for her to call the office.

## 2018-02-22 ENCOUNTER — Ambulatory Visit (INDEPENDENT_AMBULATORY_CARE_PROVIDER_SITE_OTHER): Payer: Medicaid Other | Admitting: Orthopaedic Surgery

## 2018-02-22 ENCOUNTER — Encounter: Payer: Self-pay | Admitting: Orthopaedic Surgery

## 2018-02-22 VITALS — BP 99/41 | HR 75 | Ht 59.0 in | Wt 177.0 lb

## 2018-02-22 DIAGNOSIS — S96911A Strain of unspecified muscle and tendon at ankle and foot level, right foot, initial encounter: Secondary | ICD-10-CM | POA: Diagnosis not present

## 2018-02-22 DIAGNOSIS — F17210 Nicotine dependence, cigarettes, uncomplicated: Secondary | ICD-10-CM | POA: Diagnosis not present

## 2018-02-22 NOTE — Progress Notes (Signed)
Patient UV:OZDGU Stacey Cook, female DOB:October 14, 1995, 22 y.o. YQI:[id number]  Chief Complaint  Patient presents with  . Ankle Injury    ER follow up on right ankle, DOI 02-15-18.    HPI  Stacey Cook is a 21 y.o. female who stepped off a truck and hurt her right ankle on 02-15-18.  She was seen in the ER shortly after the injury.  X-rays were negative for fracture. She was given a CAM walker.  She has less swelling but still has pain.  I saw her a year ago with right foot pain.  That gradually went away.    I have reviewed my old notes.  I have reviewed the notes from the ER and the x-rays and x-ray reports.   Body mass index is 35.75 kg/m.  ROS  Review of Systems  Respiratory: Positive for shortness of breath.   Cardiovascular: Negative for leg swelling.  Musculoskeletal: Positive for arthralgias, gait problem and joint swelling.  Neurological: Positive for headaches.  Psychiatric/Behavioral: The patient is nervous/anxious.   All other systems reviewed and are negative.   All other systems reviewed and are negative.  The following is a summary of the past history medically, past history surgically, known current medicines, social history and family history.  This information is gathered electronically by the computer from prior information and documentation.  I review this each visit and have found including this information at this point in the chart is beneficial and informative.    Past Medical History:  Diagnosis Date  . Abdominal pain   . ADD (attention deficit disorder)   . Anginal pain ([place])   . Anxiety   . Arthritis   . Asthma   . 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 Cervicalgia   . Cholestasis during pregnancy   . Chronic abdominal pain   . Complication of anesthesia    light headed  . Constipation   . Depression    not on meds  . Ear mass   . Gestational diabetes    pt states not been checking sugars regularly at home; nor  has she been taking her Metformin  . Gestational diabetes   . Headache   . HSV infection   . Low iron   . PONV (postoperative nausea and vomiting)   . Suicidal intent   . UTI (lower urinary tract infection) 05/2014  . Vomiting     Past Surgical History:  Procedure Laterality Date  . CHOLECYSTECTOMY N/A 01/16/2018   Procedure: LAPAROSCOPIC CHOLECYSTECTOMY;  Surgeon: Aviva Signs, MD;  Location: [facility];  Service: General;  Laterality: N/A;  per Dr. Arnoldo Morale, pt will most likely go home and he will tell her to arrive at 10:45 - already has labs  . CHOLESTEATOMA EXCISION    . DILATION AND EVACUATION N/A 09/11/2016   Procedure: DILATATION AND CURRATAGE  2ND TRIMESTER;  Surgeon: Jonnie Kind, MD;  Location: [facility];  Service: Gynecology;  Laterality: N/A;  . IMPLANTATION BONE ANCHORED HEARING AID Right 04/2013  . 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 TYMPANOPLASTY Left   . TYMPANOSTOMY      Family History  Problem Relation Age of Onset  . Cholelithiasis Mother   . Kidney disease Mother        stones  . Depression Mother   . Hypertension Maternal Grandmother   . Diabetes Maternal Grandmother   . Stroke Maternal Grandmother   . Seizures Maternal Grandmother   .  Asthma Maternal Grandmother   . Hyperlipidemia Maternal Grandmother   . Thyroid disease Maternal Grandmother   . Asthma Brother   . Seizures Maternal Uncle   . Cancer Other        breast- great aunt  . Celiac disease Neg Hx     Social History Social History   Tobacco Use  . Smoking status: Current Every Day Smoker    Packs/day: 0.50    Years: 1.00    Pack years: 0.50    Types: Cigarettes    Last attempt to quit: 03/11/2017    Years since quitting: 0.9  . Smokeless tobacco: Never Used  Substance Use Topics  . Alcohol use: No    Alcohol/week: 0.0 standard drinks  . Drug use: No    Allergies  Allergen Reactions  . Keflex [Cephalexin] Other (See Comments)    Reaction:  Elevated liver  enzymes   . Other Other (See Comments)    Pt is allergic to surgical glue swelling;  Cats-congestion, sneezing, eyes watery, itching all over Reaction:  Infection   . Adhesive [Tape] Rash    Current Outpatient Medications  Medication Sig Dispense Refill  . HYDROcodone-acetaminophen (NORCO) 5-325 MG tablet Take 1 tablet by mouth every 4 (four) hours as needed for moderate pain. 30 tablet 0   No current facility-administered medications for this visit.      Physical Exam  Blood pressure (!) 99/41, pulse 75, height 4\' 11"  (1.499 m), weight 177 lb (80.3 kg), last menstrual period 02/14/2018, not currently breastfeeding.  Constitutional: overall normal hygiene, normal nutrition, well developed, normal grooming, normal body habitus. Assistive device:CAM walker right  Musculoskeletal: gait and station Limp right, muscle tone and strength are normal, no tremors or atrophy is present.  .  Neurological: coordination overall normal.  Deep tendon reflex/nerve stretch intact.  Sensation normal.  Cranial nerves II-XII intact.   Skin:   Normal overall no scars, lesions, ulcers or rashes. No psoriasis.  Psychiatric: Alert and oriented x 3.  Recent memory intact, remote memory unclear.  Normal mood and affect. Well groomed.  Good eye contact.  Cardiovascular: overall no swelling, no varicosities, no edema bilaterally, normal temperatures of the legs and arms, no clubbing, cyanosis and good capillary refill.  Lymphatic: palpation is normal.  Right ankle with lateral swelling and pain over the anterior talofibular ligament. ROM is full but tender.  Limp to the right.  NV intact.  She has some lateral ecchymosis.  All other systems reviewed and are negative   The patient has been educated about the nature of the problem(s) and counseled on treatment options.  The patient appeared to understand what I have discussed and is in agreement with it.  Encounter Diagnoses  Name Primary?  . Strain of  right ankle, initial encounter Yes  . Cigarette nicotine dependence without complication     PLAN Call if any problems.  Precautions discussed.  Continue current medications.   Return to clinic 2 weeks   Contrast bath sheet of instructions given.  Electronically Signed Sanjuana Kava, MD 10/16/20192:09 PM

## 2018-03-07 ENCOUNTER — Encounter: Payer: Self-pay | Admitting: Women's Health

## 2018-03-07 ENCOUNTER — Ambulatory Visit (INDEPENDENT_AMBULATORY_CARE_PROVIDER_SITE_OTHER): Payer: Medicaid Other | Admitting: Women's Health

## 2018-03-07 VITALS — BP 127/73 | HR 104 | Ht 59.0 in | Wt 176.0 lb

## 2018-03-07 DIAGNOSIS — Z3202 Encounter for pregnancy test, result negative: Secondary | ICD-10-CM

## 2018-03-07 DIAGNOSIS — Z30011 Encounter for initial prescription of contraceptive pills: Secondary | ICD-10-CM | POA: Diagnosis not present

## 2018-03-07 LAB — POCT URINE PREGNANCY: Preg Test, Ur: NEGATIVE

## 2018-03-07 MED ORDER — LO LOESTRIN FE 1 MG-10 MCG / 10 MCG PO TABS: 1.0000 | ORAL_TABLET | Freq: Every day | ORAL | 3 refills | Status: DC

## 2018-03-07 NOTE — Patient Instructions (Signed)
Ethinyl Estradiol; Norethindrone Acetate; Ferrous fumarate tablets or capsules What is this medicine? ETHINYL ESTRADIOL; NORETHINDRONE ACETATE; FERROUS FUMARATE (ETH in il es tra DYE ole; nor eth IN drone AS e tate; FER us FUE ma rate) is an oral contraceptive. The products combine two types of female hormones, an estrogen and a progestin. They are used to prevent ovulation and pregnancy. Some products are also used to treat acne in females. This medicine may be used for other purposes; ask your health care provider or pharmacist if you have questions. COMMON BRAND NAME(S): Blisovi 24 Fe, Blisovi Fe, Estrostep Fe, Gildess 24 Fe, Gildess Fe 1.5/30, Gildess Fe 1/20, Junel Fe 1.5/30, Junel Fe 1/20, Junel Fe 24, Larin Fe, Lo Loestrin Fe, Loestrin 24 Fe, Loestrin FE 1.5/30, Loestrin FE 1/20, Lomedia 24 Fe, Microgestin 24 Fe, Microgestin Fe 1.5/30, Microgestin Fe 1/20, Tarina Fe 1/20, Taytulla, Tilia Fe, Tri-Legest Fe What should I tell my health care provider before I take this medicine? They need to know if you have any of these conditions: -abnormal vaginal bleeding -blood vessel disease -breast, cervical, endometrial, ovarian, liver, or uterine cancer -diabetes -gallbladder disease -heart disease or recent heart attack -high blood pressure -high cholesterol -history of blood clots -kidney disease -liver disease -migraine headaches -smoke tobacco -stroke -systemic lupus erythematosus (SLE) -an unusual or allergic reaction to estrogens, progestins, other medicines, foods, dyes, or preservatives -pregnant or trying to get pregnant -breast-feeding How should I use this medicine? Take this medicine by mouth. To reduce nausea, this medicine may be taken with food. Follow the directions on the prescription label. Take this medicine at the same time each day and in the order directed on the package. Do not take your medicine more often than directed. A patient package insert for the product will be  given with each prescription and refill. Read this sheet carefully each time. The sheet may change frequently. Contact your pediatrician regarding the use of this medicine in children. Special care may be needed. This medicine has been used in female children who have started having menstrual periods. Overdosage: If you think you have taken too much of this medicine contact a poison control center or emergency room at once. NOTE: This medicine is only for you. Do not share this medicine with others. What if I miss a dose? If you miss a dose, refer to the patient information sheet you received with your medicine for direction. If you miss more than one pill, this medicine may not be as effective and you may need to use another form of birth control. What may interact with this medicine? Do not take this medicine with the following medication: -dasabuvir; ombitasvir; paritaprevir; ritonavir -ombitasvir; paritaprevir; ritonavir This medicine may also interact with the following medications: -acetaminophen -antibiotics or medicines for infections, especially rifampin, rifabutin, rifapentine, and griseofulvin, and possibly penicillins or tetracyclines -aprepitant -ascorbic acid (vitamin C) -atorvastatin -barbiturate medicines, such as phenobarbital -bosentan -carbamazepine -caffeine -clofibrate -cyclosporine -dantrolene -doxercalciferol -felbamate -grapefruit juice -hydrocortisone -medicines for anxiety or sleeping problems, such as diazepam or temazepam -medicines for diabetes, including pioglitazone -mineral oil -modafinil -mycophenolate -nefazodone -oxcarbazepine -phenytoin -prednisolone -ritonavir or other medicines for HIV infection or AIDS -rosuvastatin -selegiline -soy isoflavones supplements -St. John's wort -tamoxifen or raloxifene -theophylline -thyroid hormones -topiramate -warfarin This list may not describe all possible interactions. Give your health care  provider a list of all the medicines, herbs, non-prescription drugs, or dietary supplements you use. Also tell them if you smoke, drink alcohol, or use illegal drugs. Some   items may interact with your medicine. What should I watch for while using this medicine? Visit your doctor or health care professional for regular checks on your progress. You will need a regular breast and pelvic exam and Pap smear while on this medicine. Use an additional method of contraception during the first cycle that you take these tablets. If you have any reason to think you are pregnant, stop taking this medicine right away and contact your doctor or health care professional. If you are taking this medicine for hormone related problems, it may take several cycles of use to see improvement in your condition. Smoking increases the risk of getting a blood clot or having a stroke while you are taking birth control pills, especially if you are more than 20 years old. You are strongly advised not to smoke. This medicine can make your body retain fluid, making your fingers, hands, or ankles swell. Your blood pressure can go up. Contact your doctor or health care professional if you feel you are retaining fluid. This medicine can make you more sensitive to the sun. Keep out of the sun. If you cannot avoid being in the sun, wear protective clothing and use sunscreen. Do not use sun lamps or tanning beds/booths. If you wear contact lenses and notice visual changes, or if the lenses begin to feel uncomfortable, consult your eye care specialist. In some women, tenderness, swelling, or minor bleeding of the gums may occur. Notify your dentist if this happens. Brushing and flossing your teeth regularly may help limit this. See your dentist regularly and inform your dentist of the medicines you are taking. If you are going to have elective surgery, you may need to stop taking this medicine before the surgery. Consult your health care  professional for advice. This medicine does not protect you against HIV infection (AIDS) or any other sexually transmitted diseases. What side effects may I notice from receiving this medicine? Side effects that you should report to your doctor or health care professional as soon as possible: -allergic reactions like skin rash, itching or hives, swelling of the face, lips, or tongue -breast tissue changes or discharge -changes in vaginal bleeding during your period or between your periods -changes in vision -chest pain -confusion -coughing up blood -dizziness -feeling faint or lightheaded -headaches or migraines -leg, arm or groin pain -loss of balance or coordination -severe or sudden headaches -stomach pain (severe) -sudden shortness of breath -sudden numbness or weakness of the face, arm or leg -symptoms of vaginal infection like itching, irritation or unusual discharge -tenderness in the upper abdomen -trouble speaking or understanding -vomiting -yellowing of the eyes or skin Side effects that usually do not require medical attention (report to your doctor or health care professional if they continue or are bothersome): -breakthrough bleeding and spotting that continues beyond the 3 initial cycles of pills -breast tenderness -mood changes, anxiety, depression, frustration, anger, or emotional outbursts -increased sensitivity to sun or ultraviolet light -nausea -skin rash, acne, or brown spots on the skin -weight gain (slight) This list may not describe all possible side effects. Call your doctor for medical advice about side effects. You may report side effects to FDA at 1-800-FDA-1088. Where should I keep my medicine? Keep out of the reach of children. Store at room temperature between 15 and 30 degrees C (59 and 86 degrees F). Throw away any unused medicine after the expiration date. NOTE: This sheet is a summary. It may not cover all possible information. If you   have  questions about this medicine, talk to your doctor, pharmacist, or health care provider.  2018 Elsevier/Gold Standard (2016-01-05 08:04:41)

## 2018-03-07 NOTE — Progress Notes (Signed)
   GYN VISIT Patient name: Stacey Cook MRN [id number]  Date of birth: 29-Dec-1995 Chief Complaint:   Follow-up  History of Present Illness:   Stacey Cook is a 22 y.o. G56P1011 Caucasian female being seen today for f/u on nuva ring rx'd 12/02/17. Had cholecystectomy in Sept, removed for surgery x 1wk, then periods got off. Started bleeding while nuva ring in few weeks ago and scared her thinking it was a miscarriage. Has not had nuvaring in since.Wants to switch to pills.     Patient's last menstrual period was 03/04/2018. The current method of family planning is nuva ring. Last pap <21yo. Results were:  n/a Review of Systems:   Pertinent items are noted in HPI Denies fever/chills, dizziness, headaches, visual disturbances, fatigue, shortness of breath, chest pain, abdominal pain, vomiting, abnormal vaginal discharge/itching/odor/irritation, problems with periods, bowel movements, urination, or intercourse unless otherwise stated above.  Pertinent History Reviewed:  Reviewed past medical,surgical, social, obstetrical and family history.  Reviewed problem list, medications and allergies. Physical Assessment:   Vitals:   03/07/18 1550  BP: 127/73  Pulse: (!) 104  Weight: 176 lb (79.8 kg)  Height: 4\' 11"  (1.499 m)  Body mass index is 35.55 kg/m.       Physical Examination:   General appearance: alert, well appearing, and in no distress  Mental status: alert, oriented to person, place, and time  Skin: warm & dry   Cardiovascular: normal heart rate noted  Respiratory: normal respiratory effort, no distress  Abdomen: soft, non-tender   Pelvic: examination not indicated  Extremities: no edema   Results for orders placed or performed in visit on 03/07/18 (from the past 24 hour(s))  POCT urine pregnancy   Collection Time: 03/07/18  3:52 PM  Result Value Ref Range   Preg Test, Ur Negative Negative    Assessment & Plan:  1) Contraception management> rx LoLoestrin 3pk w/ 3RF, condoms x  2wks  Meds:  Meds ordered this encounter  Medications  . LO LOESTRIN FE 1 MG-10 MCG / 10 MCG tablet    Sig: Take 1 tablet by mouth daily.    Dispense:  3 Package    Refill:  3    For co-pay card, pt to text "Lo Loestrin Fe " to [phone]              Co-pay card must be run in second position  "other coverage code 3"  if denied d/t PA, step edit, or insurance denial    Order Specific Question:   Supervising Provider    Answer:   Tania Ade H [[id number]]    Orders Placed This Encounter  Procedures  . POCT urine pregnancy    Return for after 02/21/19 for , Pap & physical.  Roma Schanz CNM, [facility] 03/07/2018 4:10 PM

## 2018-03-08 ENCOUNTER — Ambulatory Visit (INDEPENDENT_AMBULATORY_CARE_PROVIDER_SITE_OTHER): Payer: Medicaid Other | Admitting: Orthopaedic Surgery

## 2018-03-08 ENCOUNTER — Encounter: Payer: Self-pay | Admitting: Orthopaedic Surgery

## 2018-03-08 VITALS — BP 120/75 | HR 92 | Ht 59.0 in | Wt 177.0 lb

## 2018-03-08 DIAGNOSIS — F17210 Nicotine dependence, cigarettes, uncomplicated: Secondary | ICD-10-CM

## 2018-03-08 DIAGNOSIS — S96911A Strain of unspecified muscle and tendon at ankle and foot level, right foot, initial encounter: Secondary | ICD-10-CM | POA: Diagnosis not present

## 2018-03-08 MED ORDER — NAPROXEN 500 MG PO TABS: 500.0000 mg | ORAL_TABLET | Freq: Two times a day (BID) | ORAL | 5 refills | Status: DC

## 2018-03-08 NOTE — Progress Notes (Signed)
[name], female DOB:04-15-1996, 22 y.o. [id number]  Chief Complaint  Patient presents with  . Ankle Pain    Right ankle feels the same. Boot from ER broke.     HPI  Stacey Cook is a 22 y.o. female who has continued pain of the right lateral ankle.  She said the straps on the CAM walker came off.  I will give ankle brace today with straps.  I have told her to do the contrast baths and use Aspercreme or BioFreeze to the area.  I would like her to walk more with the brace. I will begin Naprosyn today.   Body mass index is 35.75 kg/m.  ROS  Review of Systems  Respiratory: Positive for shortness of breath.   Cardiovascular: Negative for leg swelling.  Musculoskeletal: Positive for arthralgias, gait problem and joint swelling.  Neurological: Positive for headaches.  Psychiatric/Behavioral: The patient is nervous/anxious.   All other systems reviewed and are negative.   All other systems reviewed and are negative.  The following is a summary of the past history medically, past history surgically, known current medicines, social history and family history.  This information is gathered electronically by the computer from prior information and documentation.  I review this each visit and have found including this information at this point in the chart is beneficial and informative.    Past Medical History:  Diagnosis Date  . Abdominal pain   . ADD (attention deficit disorder)   . Anginal pain ([place])   . Anxiety   . Arthritis   . Asthma   . 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 Cervicalgia   . Cholestasis during pregnancy   . Chronic abdominal pain   . Complication of anesthesia    light headed  . Constipation   . Depression    not on meds  . Ear mass   . Gestational diabetes    pt states not been checking sugars regularly at home; nor has she been taking her Metformin  . Gestational diabetes   . Headache   . HSV infection    . Low iron   . PONV (postoperative nausea and vomiting)   . Suicidal intent   . UTI (lower urinary tract infection) 05/2014  . Vomiting     Past Surgical History:  Procedure Laterality Date  . CHOLECYSTECTOMY N/A 01/16/2018   Procedure: LAPAROSCOPIC CHOLECYSTECTOMY;  Surgeon: Aviva Signs, MD;  Location: [facility];  Service: General;  Laterality: N/A;  per Dr. Arnoldo Morale, pt will most likely go home and he will tell her to arrive at 10:45 - already has labs  . CHOLESTEATOMA EXCISION    . DILATION AND EVACUATION N/A 09/11/2016   Procedure: DILATATION AND CURRATAGE  2ND TRIMESTER;  Surgeon: Jonnie Kind, MD;  Location: [place] [facility];  Service: Gynecology;  Laterality: N/A;  . IMPLANTATION BONE ANCHORED HEARING AID Right 04/2013  . 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 TYMPANOPLASTY Left   . TYMPANOSTOMY      Family History  Problem Relation Age of Onset  . Cholelithiasis Mother   . Kidney disease Mother        stones  . Depression Mother   . Hypertension Maternal Grandmother   . Diabetes Maternal Grandmother   . Stroke Maternal Grandmother   . Seizures Maternal Grandmother   . Asthma Maternal Grandmother   . Hyperlipidemia Maternal Grandmother   . Thyroid disease Maternal Grandmother   .  Asthma Brother   . Seizures Maternal Uncle   . Cancer Other        breast- great aunt  . Celiac disease Neg Hx     Social History Social History   Tobacco Use  . Smoking status: Current Every Day Smoker    Packs/day: 0.50    Years: 1.00    Pack years: 0.50    Types: Cigarettes    Last attempt to quit: 03/11/2017    Years since quitting: 0.9  . Smokeless tobacco: Never Used  Substance Use Topics  . Alcohol use: No    Alcohol/week: 0.0 standard drinks  . Drug use: No    Allergies  Allergen Reactions  . Keflex [Cephalexin] Other (See Comments)    Reaction:  Elevated liver enzymes   . Other Other (See Comments)    Pt is allergic to surgical glue swelling;   Cats-congestion, sneezing, eyes watery, itching all over Reaction:  Infection   . Adhesive [Tape] Rash    Current Outpatient Medications  Medication Sig Dispense Refill  . etonogestrel-ethinyl estradiol (NUVARING) 0.12-0.015 MG/24HR vaginal ring Place 1 each vaginally every 28 (twenty-eight) days. Insert vaginally and leave in place for 3 consecutive weeks, then remove for 1 week.    Marland Kitchen HYDROcodone-acetaminophen (NORCO) 5-325 MG tablet Take 1 tablet by mouth every 4 (four) hours as needed for moderate pain. (Patient not taking: Reported on 03/07/2018) 30 tablet 0  . LO LOESTRIN FE 1 MG-10 MCG / 10 MCG tablet Take 1 tablet by mouth daily. 3 Package 3  . naproxen (NAPROSYN) 500 MG tablet Take 1 tablet (500 mg total) by mouth 2 (two) times daily with a meal. 60 tablet 5   No current facility-administered medications for this visit.      Physical Exam  Blood pressure 120/75, pulse 92, height 4\' 11"  (1.499 m), weight 177 lb (80.3 kg), last menstrual period 03/04/2018, not currently breastfeeding.  Constitutional: overall normal hygiene, normal nutrition, well developed, normal grooming, normal body habitus. Assistive device:none  Musculoskeletal: gait and station Limp right, muscle tone and strength are normal, no tremors or atrophy is present.  .  Neurological: coordination overall normal.  Deep tendon reflex/nerve stretch intact.  Sensation normal.  Cranial nerves II-XII intact.   Skin:   Normal overall no scars, lesions, ulcers or rashes. No psoriasis.  Psychiatric: Alert and oriented x 3.  Recent memory intact, remote memory unclear.  Normal mood and affect. Well groomed.  Good eye contact.  Cardiovascular: overall no swelling, no varicosities, no edema bilaterally, normal temperatures of the legs and arms, no clubbing, cyanosis and good capillary refill.  Lymphatic: palpation is normal.  Right ankle tender, ROM full but tender, pain over the anterior talofibular ligament, NV  intact.  Some lateral swelling.  All other systems reviewed and are negative   The patient has been educated about the nature of the problem(s) and counseled on treatment options.  The patient appeared to understand what I have discussed and is in agreement with it.  Encounter Diagnoses  Name Primary?  . Strain of right ankle, initial encounter Yes  . Cigarette nicotine dependence without complication     PLAN Call if any problems.  Precautions discussed.  Begin Naprosyn 500.  Return to clinic 3 weeks   Electronically Signed Sanjuana Kava, MD 10/30/20192:53 PM

## 2018-03-28 IMAGING — DX DG HAND COMPLETE 3+V*R*
3 series · 3 of 3 positions shown · non-contrast
Comparison: None.

CLINICAL DATA: RIGHT hand pain after being slammed in truck door
today.

EXAM:
RIGHT HAND - COMPLETE 3+ VIEW

[hand pa]
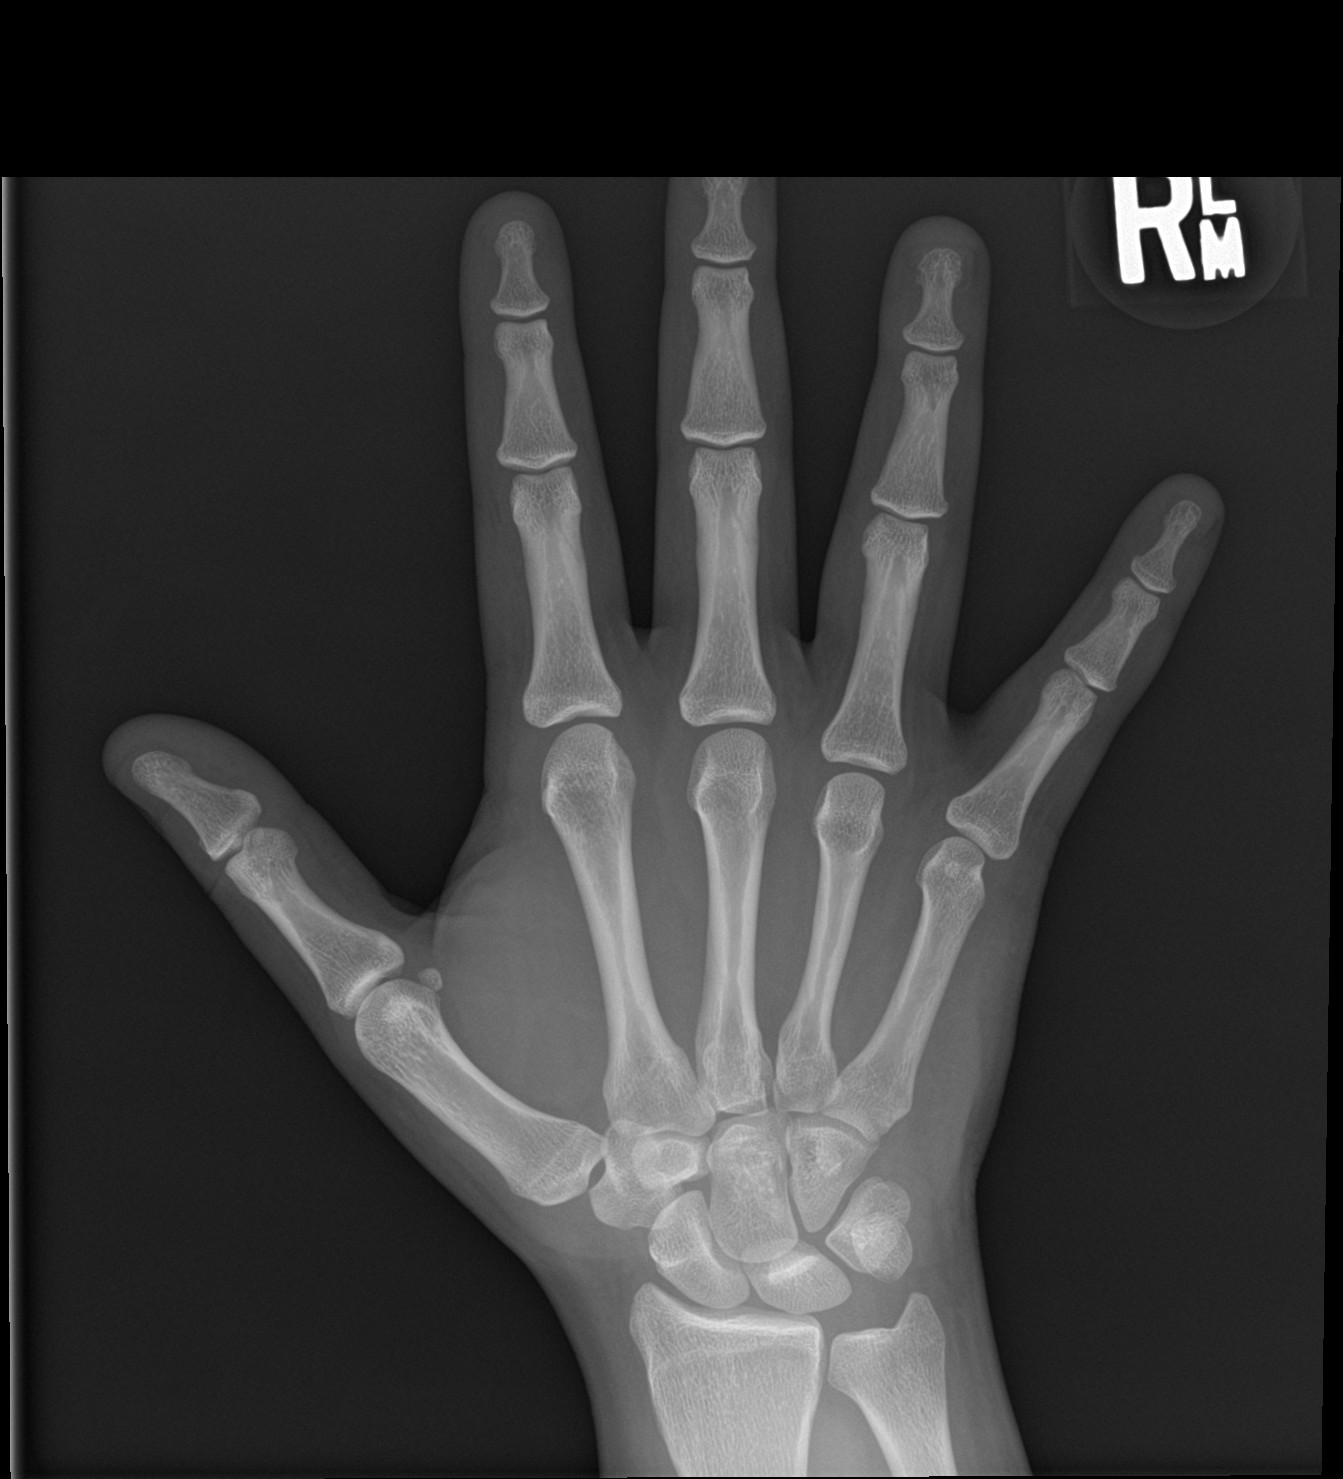

[hand obl]
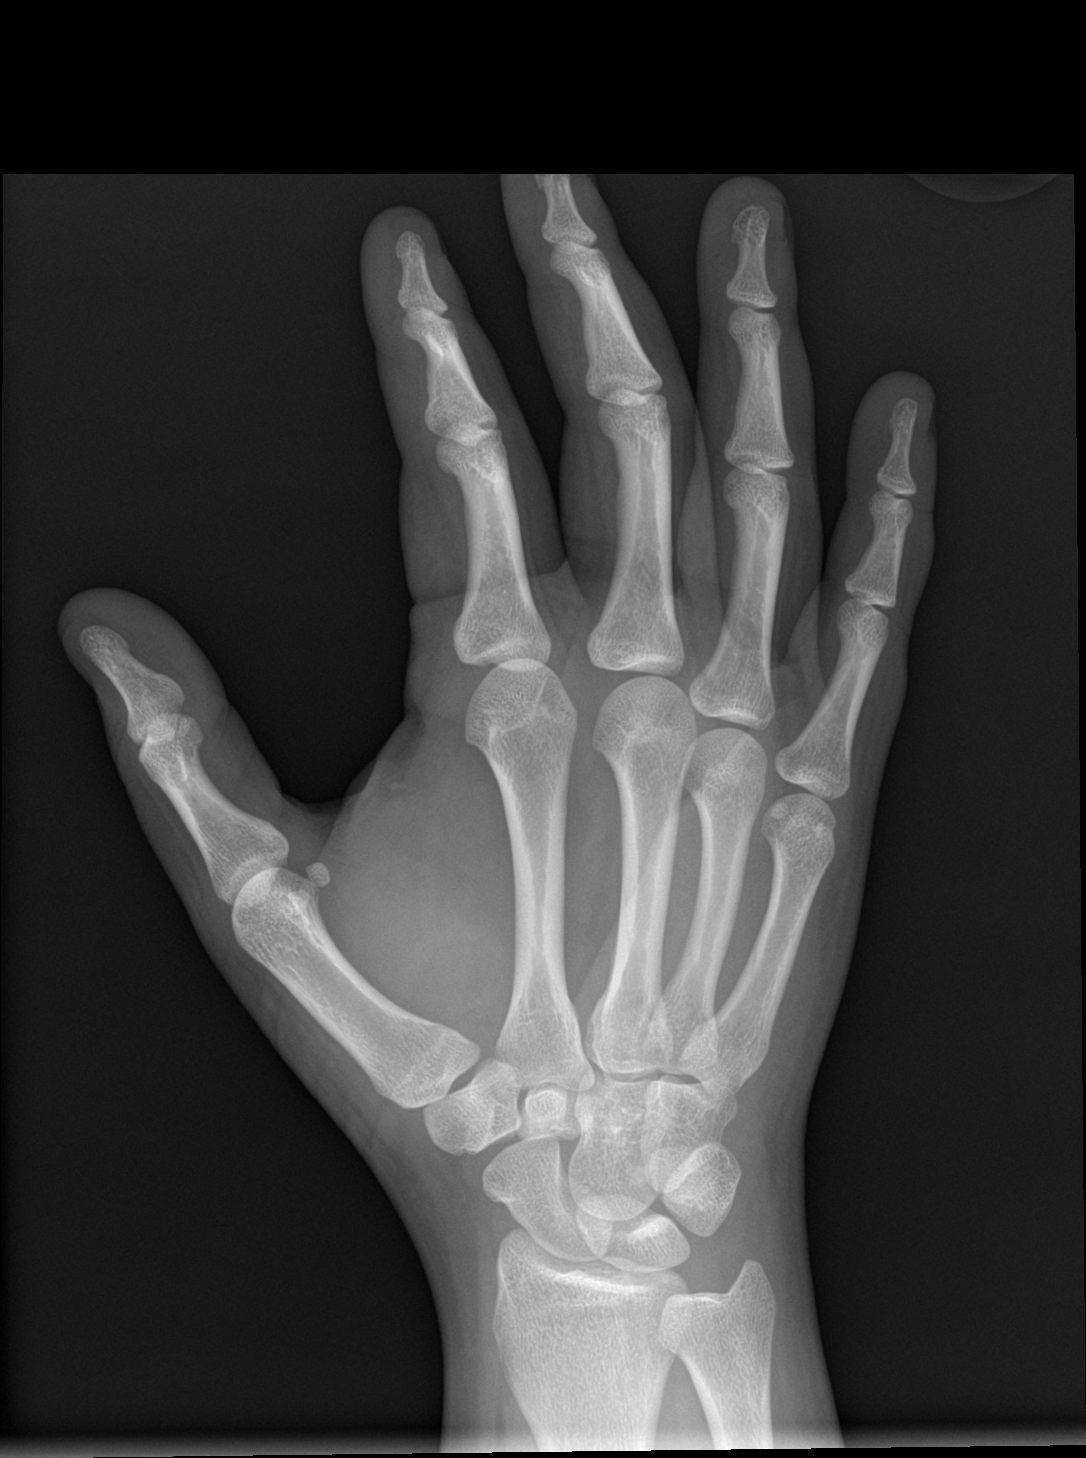

[hand lat]
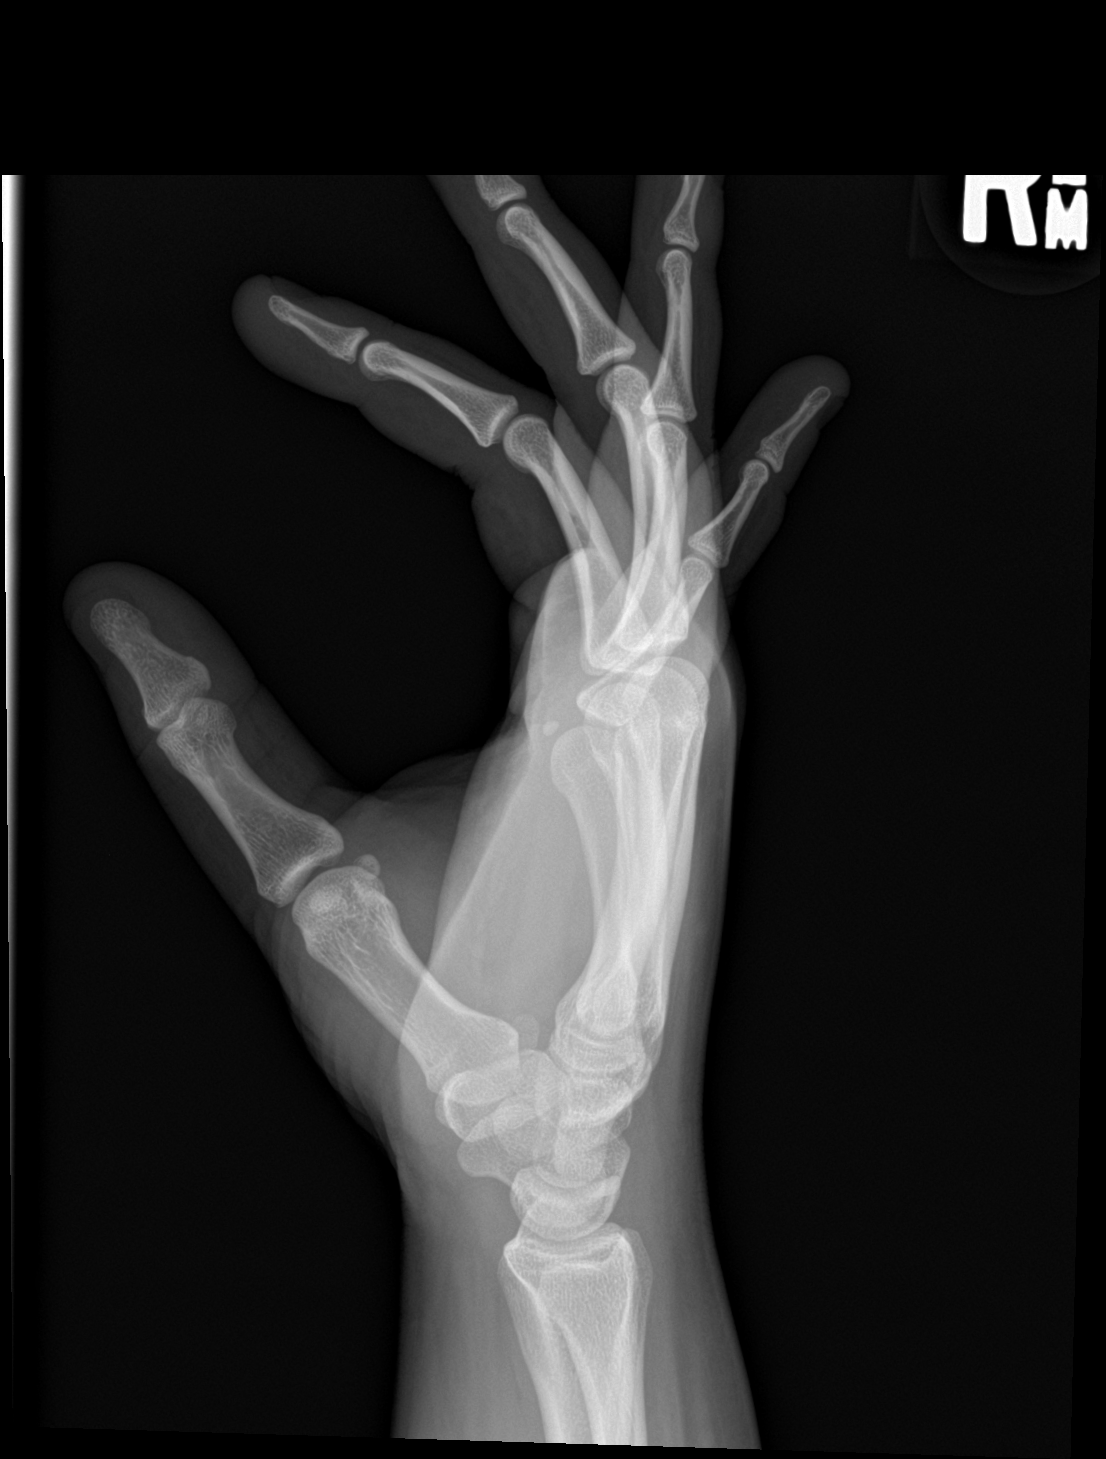

[3 of 3 positions shown; findings below may reference images not displayed]

FINDINGS: There is no evidence of fracture or dislocation. There is no
evidence of arthropathy or other focal bone abnormality. Soft
tissues are unremarkable.
IMPRESSION: Negative.

## 2018-03-29 ENCOUNTER — Ambulatory Visit: Payer: Medicaid Other | Admitting: Orthopaedic Surgery

## 2018-03-31 HISTORY — PX: INNER EAR SURGERY: SHX679

## 2018-04-25 ENCOUNTER — Ambulatory Visit: Payer: Medicaid Other | Admitting: Orthopaedic Surgery

## 2018-04-26 ENCOUNTER — Encounter: Payer: Self-pay | Admitting: Orthopaedic Surgery

## 2018-04-26 ENCOUNTER — Ambulatory Visit (INDEPENDENT_AMBULATORY_CARE_PROVIDER_SITE_OTHER): Payer: Medicaid Other | Admitting: Orthopaedic Surgery

## 2018-04-26 VITALS — BP 139/76 | HR 114 | Ht 59.0 in | Wt 176.0 lb

## 2018-04-26 DIAGNOSIS — F17210 Nicotine dependence, cigarettes, uncomplicated: Secondary | ICD-10-CM

## 2018-04-26 DIAGNOSIS — S96911A Strain of unspecified muscle and tendon at ankle and foot level, right foot, initial encounter: Secondary | ICD-10-CM

## 2018-04-26 NOTE — Progress Notes (Signed)
Patient KG:MWNUU Stacey Cook, female DOB:11/21/1995, 22 y.o. [id number]  Chief Complaint  Patient presents with  . Ankle Pain    right    HPI  Stacey Cook is a 22 y.o. female who has had an ankle strain on the right.  She is much improved.  She still has a little swelling laterally but has no limp.  She has no redness or new trauma. She is wearing regular shoes.  I have gone over precautions with her.   Body mass index is 35.55 kg/m.  ROS  Review of Systems  Respiratory: Positive for shortness of breath.   Cardiovascular: Negative for leg swelling.  Musculoskeletal: Positive for arthralgias, gait problem and joint swelling.  Neurological: Positive for headaches.  Psychiatric/Behavioral: The patient is nervous/anxious.   All other systems reviewed and are negative.   All other systems reviewed and are negative.  The following is a summary of the past history medically, past history surgically, known current medicines, social history and family history.  This information is gathered electronically by the computer from prior information and documentation.  I review this each visit and have found including this information at this point in the chart is beneficial and informative.    Past Medical History:  Diagnosis Date  . Abdominal pain   . ADD (attention deficit disorder)   . Anginal pain ([place])   . Anxiety   . Arthritis   . Asthma   . 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 Cervicalgia   . Cholestasis during pregnancy   . Chronic abdominal pain   . Complication of anesthesia    light headed  . Constipation   . Depression    not on meds  . Ear mass   . Gestational diabetes    pt states not been checking sugars regularly at home; nor has she been taking her Metformin  . Gestational diabetes   . Headache   . HSV infection   . Low iron   . PONV (postoperative nausea and vomiting)   . Suicidal intent   . UTI (lower urinary tract infection)  05/2014  . Vomiting     Past Surgical History:  Procedure Laterality Date  . CHOLECYSTECTOMY N/A 01/16/2018   Procedure: LAPAROSCOPIC CHOLECYSTECTOMY;  Surgeon: Aviva Signs, MD;  Location: [facility];  Service: General;  Laterality: N/A;  per Dr. Arnoldo Morale, pt will most likely go home and he will tell her to arrive at 10:45 - already has labs  . CHOLESTEATOMA EXCISION    . DILATION AND EVACUATION N/A 09/11/2016   Procedure: DILATATION AND CURRATAGE  2ND TRIMESTER;  Surgeon: Jonnie Kind, MD;  Location: [facility] [facility];  Service: Gynecology;  Laterality: N/A;  . IMPLANTATION BONE ANCHORED HEARING AID Right 04/2013  . 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 TYMPANOPLASTY Left   . TYMPANOSTOMY      Family History  Problem Relation Age of Onset  . Cholelithiasis Mother   . Kidney disease Mother        stones  . Depression Mother   . Hypertension Maternal Grandmother   . Diabetes Maternal Grandmother   . Stroke Maternal Grandmother   . Seizures Maternal Grandmother   . Asthma Maternal Grandmother   . Hyperlipidemia Maternal Grandmother   . Thyroid disease Maternal Grandmother   . Asthma Brother   . Seizures Maternal Uncle   . Cancer Other        breast- great aunt  .  Celiac disease Neg Hx     Social History Social History   Tobacco Use  . Smoking status: Current Every Day Smoker    Packs/day: 0.50    Years: 1.00    Pack years: 0.50    Types: Cigarettes    Last attempt to quit: 03/11/2017    Years since quitting: 1.1  . Smokeless tobacco: Never Used  Substance Use Topics  . Alcohol use: No    Alcohol/week: 0.0 standard drinks  . Drug use: No    Allergies  Allergen Reactions  . Keflex [Cephalexin] Other (See Comments)    Reaction:  Elevated liver enzymes   . Other Other (See Comments)    Pt is allergic to surgical glue swelling;  Cats-congestion, sneezing, eyes watery, itching all over Reaction:  Infection   . Adhesive [Tape] Rash    Current Outpatient  Medications  Medication Sig Dispense Refill  . etonogestrel-ethinyl estradiol (NUVARING) 0.12-0.015 MG/24HR vaginal ring Place 1 each vaginally every 28 (twenty-eight) days. Insert vaginally and leave in place for 3 consecutive weeks, then remove for 1 week.    Marland Kitchen HYDROcodone-acetaminophen (NORCO) 5-325 MG tablet Take 1 tablet by mouth every 4 (four) hours as needed for moderate pain. (Patient not taking: Reported on 03/07/2018) 30 tablet 0  . LO LOESTRIN FE 1 MG-10 MCG / 10 MCG tablet Take 1 tablet by mouth daily. 3 Package 3  . naproxen (NAPROSYN) 500 MG tablet Take 1 tablet (500 mg total) by mouth 2 (two) times daily with a meal. 60 tablet 5   No current facility-administered medications for this visit.      Physical Exam  Blood pressure 139/76, pulse (!) 114, height 4\' 11"  (1.499 m), weight 176 lb (79.8 kg), not currently breastfeeding.  Constitutional: overall normal hygiene, normal nutrition, well developed, normal grooming, normal body habitus. Assistive device:none  Musculoskeletal: gait and station Limp none, muscle tone and strength are normal, no tremors or atrophy is present.  .  Neurological: coordination overall normal.  Deep tendon reflex/nerve stretch intact.  Sensation normal.  Cranial nerves II-XII intact.   Skin:   Normal overall no scars, lesions, ulcers or rashes. No psoriasis.  Psychiatric: Alert and oriented x 3.  Recent memory intact, remote memory unclear.  Normal mood and affect. Well groomed.  Good eye contact.  Cardiovascular: overall no swelling, no varicosities, no edema bilaterally, normal temperatures of the legs and arms, no clubbing, cyanosis and good capillary refill.  Lymphatic: palpation is normal.  Right ankle has some slight lateral swelling, tender over the anterior talofibular ligament, ROM is full, NV intact.  All other systems reviewed and are negative   The patient has been educated about the nature of the problem(s) and counseled on  treatment options.  The patient appeared to understand what I have discussed and is in agreement with it.  Encounter Diagnoses  Name Primary?  . Strain of right ankle, initial encounter Yes  . Cigarette nicotine dependence without complication     PLAN Call if any problems.  Precautions discussed.  Continue current medications.   Return to clinic as needed   Electronically Signed Sanjuana Kava, MD 12/18/20191:50 PM

## 2018-05-17 ENCOUNTER — Ambulatory Visit: Payer: Medicaid Other | Admitting: Orthopedic Surgery

## 2018-05-19 ENCOUNTER — Encounter: Payer: Self-pay | Admitting: Adult Health

## 2018-05-19 ENCOUNTER — Ambulatory Visit: Payer: Medicaid Other | Admitting: Adult Health

## 2018-05-19 VITALS — BP 122/71 | HR 84 | Ht 59.0 in | Wt 181.0 lb

## 2018-05-19 DIAGNOSIS — Z3A01 Less than 8 weeks gestation of pregnancy: Secondary | ICD-10-CM | POA: Insufficient documentation

## 2018-05-19 DIAGNOSIS — N926 Irregular menstruation, unspecified: Secondary | ICD-10-CM | POA: Diagnosis not present

## 2018-05-19 DIAGNOSIS — Z3201 Encounter for pregnancy test, result positive: Secondary | ICD-10-CM | POA: Diagnosis not present

## 2018-05-19 DIAGNOSIS — O09891 Supervision of other high risk pregnancies, first trimester: Secondary | ICD-10-CM

## 2018-05-19 DIAGNOSIS — O3680X0 Pregnancy with inconclusive fetal viability, not applicable or unspecified: Secondary | ICD-10-CM

## 2018-05-19 LAB — POCT URINE PREGNANCY: PREG TEST UR: POSITIVE — AB

## 2018-05-19 MED ORDER — PROMETHAZINE HCL 25 MG PO TABS: 25.0000 mg | ORAL_TABLET | Freq: Four times a day (QID) | ORAL | 0 refills | Status: DC | PRN

## 2018-05-19 MED ORDER — PRENATAL PLUS 27-1 MG PO TABS: 1.0000 | ORAL_TABLET | Freq: Every day | ORAL | 12 refills | Status: DC

## 2018-05-19 NOTE — Patient Instructions (Signed)
First Trimester of Pregnancy  The first trimester of pregnancy is from week 1 until the end of week 13 (months 1 through 3). A week after a sperm fertilizes an egg, the egg will implant on the wall of the uterus. This embryo will begin to develop into a baby. Genes from you and your partner will form the baby. The female genes will determine whether the baby will be a boy or a girl. At 6-8 weeks, the eyes and face will be formed, and the heartbeat can be seen on ultrasound. At the end of 12 weeks, all the baby's organs will be formed.  Now that you are pregnant, you will want to do everything you can to have a healthy baby. Two of the most important things are to get good prenatal care and to follow your health care provider's instructions. Prenatal care is all the medical care you receive before the baby's birth. This care will help prevent, find, and treat any problems during the pregnancy and childbirth.  Body changes during your first trimester  Your body goes through many changes during pregnancy. The changes vary from woman to woman.   You may gain or lose a couple of pounds at first.   You may feel sick to your stomach (nauseous) and you may throw up (vomit). If the vomiting is uncontrollable, call your health care provider.   You may tire easily.   You may develop headaches that can be relieved by medicines. All medicines should be approved by your health care provider.   You may urinate more often. Painful urination may mean you have a bladder infection.   You may develop heartburn as a result of your pregnancy.   You may develop constipation because certain hormones are causing the muscles that push stool through your intestines to slow down.   You may develop hemorrhoids or swollen veins (varicose veins).   Your breasts may begin to grow larger and become tender. Your nipples may stick out more, and the tissue that surrounds them (areola) may become darker.   Your gums may bleed and may be  sensitive to brushing and flossing.   Dark spots or blotches (chloasma, mask of pregnancy) may develop on your face. This will likely fade after the baby is born.   Your menstrual periods will stop.   You may have a loss of appetite.   You may develop cravings for certain kinds of food.   You may have changes in your emotions from day to day, such as being excited to be pregnant or being concerned that something may go wrong with the pregnancy and baby.   You may have more vivid and strange dreams.   You may have changes in your hair. These can include thickening of your hair, rapid growth, and changes in texture. Some women also have hair loss during or after pregnancy, or hair that feels dry or thin. Your hair will most likely return to normal after your baby is born.  What to expect at prenatal visits  During a routine prenatal visit:   You will be weighed to make sure you and the baby are growing normally.   Your blood pressure will be taken.   Your abdomen will be measured to track your baby's growth.   The fetal heartbeat will be listened to between weeks 10 and 14 of your pregnancy.   Test results from any previous visits will be discussed.  Your health care provider may ask you:     How you are feeling.   If you are feeling the baby move.   If you have had any abnormal symptoms, such as leaking fluid, bleeding, severe headaches, or abdominal cramping.   If you are using any tobacco products, including cigarettes, chewing tobacco, and electronic cigarettes.   If you have any questions.  Other tests that may be performed during your first trimester include:   Blood tests to find your blood type and to check for the presence of any previous infections. The tests will also be used to check for low iron levels (anemia) and protein on red blood cells (Rh antibodies). Depending on your risk factors, or if you previously had diabetes during pregnancy, you may have tests to check for high blood sugar  that affects pregnant women (gestational diabetes).   Urine tests to check for infections, diabetes, or protein in the urine.   An ultrasound to confirm the proper growth and development of the baby.   Fetal screens for spinal cord problems (spina bifida) and Down syndrome.   HIV (human immunodeficiency virus) testing. Routine prenatal testing includes screening for HIV, unless you choose not to have this test.   You may need other tests to make sure you and the baby are doing well.  Follow these instructions at home:  Medicines   Follow your health care provider's instructions regarding medicine use. Specific medicines may be either safe or unsafe to take during pregnancy.   Take a prenatal vitamin that contains at least 600 micrograms (mcg) of folic acid.   If you develop constipation, try taking a stool softener if your health care provider approves.  Eating and drinking     Eat a balanced diet that includes fresh fruits and vegetables, whole grains, good sources of protein such as meat, eggs, or tofu, and low-fat dairy. Your health care provider will help you determine the amount of weight gain that is right for you.   Avoid raw meat and uncooked cheese. These carry germs that can cause birth defects in the baby.   Eating four or five small meals rather than three large meals a day may help relieve nausea and vomiting. If you start to feel nauseous, eating a few soda crackers can be helpful. Drinking liquids between meals, instead of during meals, also seems to help ease nausea and vomiting.   Limit foods that are high in fat and processed sugars, such as fried and sweet foods.   To prevent constipation:  ? Eat foods that are high in fiber, such as fresh fruits and vegetables, whole grains, and beans.  ? Drink enough fluid to keep your urine clear or pale yellow.  Activity   Exercise only as directed by your health care provider. Most women can continue their usual exercise routine during  pregnancy. Try to exercise for 30 minutes at least 5 days a week. Exercising will help you:  ? Control your weight.  ? Stay in shape.  ? Be prepared for labor and delivery.   Experiencing pain or cramping in the lower abdomen or lower back is a good sign that you should stop exercising. Check with your health care provider before continuing with normal exercises.   Try to avoid standing for long periods of time. Move your legs often if you must stand in one place for a long time.   Avoid heavy lifting.   Wear low-heeled shoes and practice good posture.   You may continue to have sex unless your health care   provider tells you not to.  Relieving pain and discomfort   Wear a good support bra to relieve breast tenderness.   Take warm sitz baths to soothe any pain or discomfort caused by hemorrhoids. Use hemorrhoid cream if your health care provider approves.   Rest with your legs elevated if you have leg cramps or low back pain.   If you develop varicose veins in your legs, wear support hose. Elevate your feet for 15 minutes, 3-4 times a day. Limit salt in your diet.  Prenatal care   Schedule your prenatal visits by the twelfth week of pregnancy. They are usually scheduled monthly at first, then more often in the last 2 months before delivery.   Write down your questions. Take them to your prenatal visits.   Keep all your prenatal visits as told by your health care provider. This is important.  Safety   Wear your seat belt at all times when driving.   Make a list of emergency phone numbers, including numbers for family, friends, the hospital, and police and fire departments.  General instructions   Ask your health care provider for a referral to a local prenatal education class. Begin classes no later than the beginning of month 6 of your pregnancy.   Ask for help if you have counseling or nutritional needs during pregnancy. Your health care provider can offer advice or refer you to specialists for help  with various needs.   Do not use hot tubs, steam rooms, or saunas.   Do not douche or use tampons or scented sanitary pads.   Do not cross your legs for long periods of time.   Avoid cat litter boxes and soil used by cats. These carry germs that can cause birth defects in the baby and possibly loss of the fetus by miscarriage or stillbirth.   Avoid all smoking, herbs, alcohol, and medicines not prescribed by your health care provider. Chemicals in these products affect the formation and growth of the baby.   Do not use any products that contain nicotine or tobacco, such as cigarettes and e-cigarettes. If you need help quitting, ask your health care provider. You may receive counseling support and other resources to help you quit.   Schedule a dentist appointment. At home, brush your teeth with a soft toothbrush and be gentle when you floss.  Contact a health care provider if:   You have dizziness.   You have mild pelvic cramps, pelvic pressure, or nagging pain in the abdominal area.   You have persistent nausea, vomiting, or diarrhea.   You have a bad smelling vaginal discharge.   You have pain when you urinate.   You notice increased swelling in your face, hands, legs, or ankles.   You are exposed to fifth disease or chickenpox.   You are exposed to [place] measles (rubella) and have never had it.  Get help right away if:   You have a fever.   You are leaking fluid from your vagina.   You have spotting or bleeding from your vagina.   You have severe abdominal cramping or pain.   You have rapid weight gain or loss.   You vomit blood or material that looks like coffee grounds.   You develop a severe headache.   You have shortness of breath.   You have any kind of trauma, such as from a fall or a car accident.  Summary   The first trimester of pregnancy is from week 1 until   the end of week 13 (months 1 through 3).   Your body goes through many changes during pregnancy. The changes vary from  woman to woman.   You will have routine prenatal visits. During those visits, your health care provider will examine you, discuss any test results you may have, and talk with you about how you are feeling.  This information is not intended to replace advice given to you by your health care provider. Make sure you discuss any questions you have with your health care provider.  Document Released: 04/20/2001 Document Revised: 04/07/2016 Document Reviewed: 04/07/2016  Elsevier Interactive Patient Education  2019 [employer]

## 2018-05-19 NOTE — Progress Notes (Signed)
Patient ID: TALAJAH SLIMP, female   DOB: [birth year], 23 y.o.   MRN: [id number] History of Present Illness:  Stacey Cook is a 23 year old white female in for UPT, has had missed period and had 2+HPTs.  PCP is Dr Quillian Quince.   Current Medications, Allergies, Past Medical History, Past Surgical History, Family History and Social History were reviewed in [employer] record.     Review of Systems: +missed period, had 2+HPTs +cramping and nausea Had baby in June 2019 and GB removed September 2019 she says,and stopped OCs 04/06/18.     Physical Exam:BP 122/71 (BP Location: Left Arm, Patient Position: Sitting, Cuff Size: Normal)   Pulse 84   Ht 4\' 11"  (1.499 m)   Wt 181 lb (82.1 kg)   LMP 03/29/2018   Breastfeeding No   BMI 36.56 kg/m   UPT +, about 7+2 weeks by LMP with EDD 01/04/2019.  General:  Well developed, well nourished, no acute distress Skin:  Warm and dry Lungs; Clear to auscultation bilaterally Cardiovascular: Regular rate and rhythm Abdomen:  Soft, globally tender Psych:  No mood changes, alert and cooperative,seems happy Fall risk is moderate.  Impression: . 1. Pregnancy examination or test, positive result   2. Less than [redacted] weeks gestation of pregnancy   3. Encounter to determine fetal viability of pregnancy, single or unspecified fetus   4. Short interval between pregnancies affecting pregnancy in first trimester, antepartum       Plan: Meds ordered this encounter  Medications  . prenatal vitamin w/FE, FA (PRENATAL 1 + 1) 27-1 MG TABS tablet    Sig: Take 1 tablet by mouth daily at 12 noon.    Dispense:  30 each    Refill:  12    Order Specific Question:   Supervising Provider    Answer:   Elonda Husky, LUTHER H [[id number]]  . promethazine (PHENERGAN) 25 MG tablet    Sig: Take 1 tablet (25 mg total) by mouth every 6 (six) hours as needed for nausea or vomiting.    Dispense:  30 tablet    Refill:  0    Order Specific Question:   Supervising Provider   Answer:   [name] [[id number]]  Eat often Return in 1 week for dating US/2 weeks for new OB Review handouts on first trimester and by [facility]

## 2018-05-26 ENCOUNTER — Ambulatory Visit (INDEPENDENT_AMBULATORY_CARE_PROVIDER_SITE_OTHER): Payer: Medicaid Other

## 2018-05-26 DIAGNOSIS — O3680X0 Pregnancy with inconclusive fetal viability, not applicable or unspecified: Secondary | ICD-10-CM

## 2018-05-26 NOTE — Progress Notes (Signed)
[name] 7 wks single IUP w/ys,positive fht 124 bpm,normal right ovary,simple left ovarian cyst 3.6 x 2.8 x 2.6 cm,crl 9.61 mm

## 2018-06-06 ENCOUNTER — Other Ambulatory Visit: Payer: Self-pay

## 2018-06-06 ENCOUNTER — Ambulatory Visit (INDEPENDENT_AMBULATORY_CARE_PROVIDER_SITE_OTHER): Payer: Medicaid Other | Admitting: Women's Health

## 2018-06-06 ENCOUNTER — Encounter: Payer: Self-pay | Admitting: Women's Health

## 2018-06-06 ENCOUNTER — Ambulatory Visit: Payer: Medicaid Other | Admitting: *Deleted

## 2018-06-06 VITALS — BP 121/76 | HR 90 | Wt 180.0 lb

## 2018-06-06 DIAGNOSIS — Z331 Pregnant state, incidental: Secondary | ICD-10-CM

## 2018-06-06 DIAGNOSIS — Z1389 Encounter for screening for other disorder: Secondary | ICD-10-CM

## 2018-06-06 DIAGNOSIS — Z3481 Encounter for supervision of other normal pregnancy, first trimester: Secondary | ICD-10-CM

## 2018-06-06 DIAGNOSIS — Z3682 Encounter for antenatal screening for nuchal translucency: Secondary | ICD-10-CM

## 2018-06-06 DIAGNOSIS — F418 Other specified anxiety disorders: Secondary | ICD-10-CM

## 2018-06-06 DIAGNOSIS — Z3A08 8 weeks gestation of pregnancy: Secondary | ICD-10-CM

## 2018-06-06 DIAGNOSIS — Z23 Encounter for immunization: Secondary | ICD-10-CM

## 2018-06-06 DIAGNOSIS — Z8632 Personal history of gestational diabetes: Secondary | ICD-10-CM

## 2018-06-06 DIAGNOSIS — Z3490 Encounter for supervision of normal pregnancy, unspecified, unspecified trimester: Secondary | ICD-10-CM | POA: Insufficient documentation

## 2018-06-06 LAB — POCT URINALYSIS DIPSTICK OB
Glucose, UA: NEGATIVE
KETONES UA: NEGATIVE
Leukocytes, UA: NEGATIVE
Nitrite, UA: NEGATIVE
POC,PROTEIN,UA: NEGATIVE
RBC UA: NEGATIVE

## 2018-06-06 MED ORDER — ESCITALOPRAM OXALATE 10 MG PO TABS: 10.0000 mg | ORAL_TABLET | Freq: Every day | ORAL | 6 refills | Status: DC

## 2018-06-06 NOTE — Patient Instructions (Signed)
Stacey Cook, I greatly value your feedback.  If you receive a survey following your visit with [name] today, we appreciate you taking the time to fill it out.  Thanks, [name], CNM, WHNP-BC   Nausea & Vomiting  Have saltine crackers or pretzels by your bed and eat a few bites before you raise your head out of bed in the morning  Eat small frequent meals throughout the day instead of large meals  Drink plenty of fluids throughout the day to stay hydrated, just don't drink a lot of fluids with your meals.  This can make your stomach fill up faster making you feel sick  Do not brush your teeth right after you eat  Products with real ginger are good for nausea, like ginger ale and ginger hard candy Make sure it says made with real ginger!  Sucking on sour candy like lemon heads is also good for nausea  If your prenatal vitamins make you nauseated, take them at night so you will sleep through the nausea  Sea Bands  If you feel like you need medicine for the nausea & vomiting please let us know  If you are unable to keep any fluids or food down please let us know   Constipation  Drink plenty of fluid, preferably water, throughout the day  Eat foods high in fiber such as fruits, vegetables, and grains  Exercise, such as walking, is a good way to keep your bowels regular  Drink warm fluids, especially warm prune juice, or decaf coffee  Eat a 1/2 cup of real oatmeal (not instant), 1/2 cup applesauce, and 1/2-1 cup warm prune juice every day  If needed, you may take Colace (docusate sodium) stool softener once or twice a day to help keep the stool soft. If you are pregnant, wait until you are out of your first trimester (12-14 weeks of pregnancy)  If you still are having problems with constipation, you may take Miralax once daily as needed to help keep your bowels regular.  If you are pregnant, wait until you are out of your first trimester (12-14 weeks of pregnancy)   First Trimester  of Pregnancy The first trimester of pregnancy is from week 1 until the end of week 12 (months 1 through 3). A week after a sperm fertilizes an egg, the egg will implant on the wall of the uterus. This embryo will begin to develop into a baby. Genes from you and your partner are forming the baby. The female genes determine whether the baby is a boy or a girl. At 6-8 weeks, the eyes and face are formed, and the heartbeat can be seen on ultrasound. At the end of 12 weeks, all the baby's organs are formed.  Now that you are pregnant, you will want to do everything you can to have a healthy baby. Two of the most important things are to get good prenatal care and to follow your health care provider's instructions. Prenatal care is all the medical care you receive before the baby's birth. This care will help prevent, find, and treat any problems during the pregnancy and childbirth. BODY CHANGES Your body goes through many changes during pregnancy. The changes vary from woman to woman.   You may gain or lose a couple of pounds at first.  You may feel sick to your stomach (nauseous) and throw up (vomit). If the vomiting is uncontrollable, call your health care provider.  You may tire easily.  You may develop headaches  that can be relieved by medicines approved by your health care provider.  You may urinate more often. Painful urination may mean you have a bladder infection.  You may develop heartburn as a result of your pregnancy.  You may develop constipation because certain hormones are causing the muscles that push waste through your intestines to slow down.  You may develop hemorrhoids or swollen, bulging veins (varicose veins).  Your breasts may begin to grow larger and become tender. Your nipples may stick out more, and the tissue that surrounds them (areola) may become darker.  Your gums may bleed and may be sensitive to brushing and flossing.  Dark spots or blotches (chloasma, mask of  pregnancy) may develop on your face. This will likely fade after the baby is born.  Your menstrual periods will stop.  You may have a loss of appetite.  You may develop cravings for certain kinds of food.  You may have changes in your emotions from day to day, such as being excited to be pregnant or being concerned that something may go wrong with the pregnancy and baby.  You may have more vivid and strange dreams.  You may have changes in your hair. These can include thickening of your hair, rapid growth, and changes in texture. Some women also have hair loss during or after pregnancy, or hair that feels dry or thin. Your hair will most likely return to normal after your baby is born. WHAT TO EXPECT AT YOUR PRENATAL VISITS During a routine prenatal visit:  You will be weighed to make sure you and the baby are growing normally.  Your blood pressure will be taken.  Your abdomen will be measured to track your baby's growth.  The fetal heartbeat will be listened to starting around week 10 or 12 of your pregnancy.  Test results from any previous visits will be discussed. Your health care provider may ask you:  How you are feeling.  If you are feeling the baby move.  If you have had any abnormal symptoms, such as leaking fluid, bleeding, severe headaches, or abdominal cramping.  If you have any questions. Other tests that may be performed during your first trimester include:  Blood tests to find your blood type and to check for the presence of any previous infections. They will also be used to check for low iron levels (anemia) and Rh antibodies. Later in the pregnancy, blood tests for diabetes will be done along with other tests if problems develop.  Urine tests to check for infections, diabetes, or protein in the urine.  An ultrasound to confirm the proper growth and development of the baby.  An amniocentesis to check for possible genetic problems.  Fetal screens for spina  bifida and Down syndrome.  You may need other tests to make sure you and the baby are doing well. HOME CARE INSTRUCTIONS  Medicines  Follow your health care provider's instructions regarding medicine use. Specific medicines may be either safe or unsafe to take during pregnancy.  Take your prenatal vitamins as directed.  If you develop constipation, try taking a stool softener if your health care provider approves. Diet  Eat regular, well-balanced meals. Choose a variety of foods, such as meat or vegetable-based protein, fish, milk and low-fat dairy products, vegetables, fruits, and whole grain breads and cereals. Your health care provider will help you determine the amount of weight gain that is right for you.  Avoid raw meat and uncooked cheese. These carry germs that can  cause birth defects in the baby.  Eating four or five small meals rather than three large meals a day may help relieve nausea and vomiting. If you start to feel nauseous, eating a few soda crackers can be helpful. Drinking liquids between meals instead of during meals also seems to help nausea and vomiting.  If you develop constipation, eat more high-fiber foods, such as fresh vegetables or fruit and whole grains. Drink enough fluids to keep your urine clear or pale yellow. Activity and Exercise  Exercise only as directed by your health care provider. Exercising will help you:  Control your weight.  Stay in shape.  Be prepared for labor and delivery.  Experiencing pain or cramping in the lower abdomen or low back is a good sign that you should stop exercising. Check with your health care provider before continuing normal exercises.  Try to avoid standing for long periods of time. Move your legs often if you must stand in one place for a long time.  Avoid heavy lifting.  Wear low-heeled shoes, and practice good posture.  You may continue to have sex unless your health care provider directs you  otherwise. Relief of Pain or Discomfort  Wear a good support bra for breast tenderness.   Take warm sitz baths to soothe any pain or discomfort caused by hemorrhoids. Use hemorrhoid cream if your health care provider approves.   Rest with your legs elevated if you have leg cramps or low back pain.  If you develop varicose veins in your legs, wear support hose. Elevate your feet for 15 minutes, 3-4 times a day. Limit salt in your diet. Prenatal Care  Schedule your prenatal visits by the twelfth week of pregnancy. They are usually scheduled monthly at first, then more often in the last 2 months before delivery.  Write down your questions. Take them to your prenatal visits.  Keep all your prenatal visits as directed by your health care provider. Safety  Wear your seat belt at all times when driving.  Make a list of emergency phone numbers, including numbers for family, friends, the hospital, and police and fire departments. General Tips  Ask your health care provider for a referral to a local prenatal education class. Begin classes no later than at the beginning of month 6 of your pregnancy.  Ask for help if you have counseling or nutritional needs during pregnancy. Your health care provider can offer advice or refer you to specialists for help with various needs.  Do not use hot tubs, steam rooms, or saunas.  Do not douche or use tampons or scented sanitary pads.  Do not cross your legs for long periods of time.  Avoid cat litter boxes and soil used by cats. These carry germs that can cause birth defects in the baby and possibly loss of the fetus by miscarriage or stillbirth.  Avoid all smoking, herbs, alcohol, and medicines not prescribed by your health care provider. Chemicals in these affect the formation and growth of the baby.  Schedule a dentist appointment. At home, brush your teeth with a soft toothbrush and be gentle when you floss. SEEK MEDICAL CARE IF:   You have  dizziness.  You have mild pelvic cramps, pelvic pressure, or nagging pain in the abdominal area.  You have persistent nausea, vomiting, or diarrhea.  You have a bad smelling vaginal discharge.  You have pain with urination.  You notice increased swelling in your face, hands, legs, or ankles. SEEK IMMEDIATE MEDICAL CARE IF:  You have a fever.  You are leaking fluid from your vagina.  You have spotting or bleeding from your vagina.  You have severe abdominal cramping or pain.  You have rapid weight gain or loss.  You vomit blood or material that looks like coffee grounds.  You are exposed to [name] measles and have never had them.  You are exposed to fifth disease or chickenpox.  You develop a severe headache.  You have shortness of breath.  You have any kind of trauma, such as from a fall or a car accident. Document Released: 04/20/2001 Document Revised: 09/10/2013 Document Reviewed: 03/06/2013 [facility] Patient Information 2015 [place], [place]. This information is not intended to replace advice given to you by your health care provider. Make sure you discuss any questions you have with your health care provider.

## 2018-06-06 NOTE — Progress Notes (Signed)
INITIAL OBSTETRICAL VISIT Patient name: Stacey Cook MRN [id number]  Date of birth: Sep 25, 1995 Chief Complaint:   Initial Prenatal Visit  History of Present Illness:   Stacey Cook is a 23 y.o. G55P1011 Caucasian female at [redacted]w[redacted]d by 7wk u/s with an Estimated Date of Delivery: 01/12/19 being seen today for her initial obstetrical visit. Conceived on COCs, had ear surgery, was on 'a lot of antibiotics'.    Her obstetrical history is significant for SAB x 1, term SVB @ 96GEZ- pregnancy complicated by M6QH, ICP; delivery complicated by shoulder dystocia and PPH requiring Bakri balloon/TXA/methergine.   Today she reports dep/anx, occ suicidal ideations- no plan, not currently on meds, lexapro has helped most in past- took for about a month postpartum and didn't seem to be helping- so stopped. Therapy at [facility], last visit in July.  Nausea- phenergan helps, also has diclegis at home from previous pregnancy Depression screen [facility] 2/9 06/06/2018 04/28/2017 08/05/2016 05/20/2016 05/20/2016  Decreased Interest 2 3 1 1 1   Down, Depressed, Hopeless 3 1 2 3 3   PHQ - 2 Score 5 4 3 4 4   Altered sleeping 3 3 2 1  -  Tired, decreased energy 3 3 3 1  -  Change in appetite 2 2 2 3  -  Feeling bad or failure about yourself  1 0 1 3 -  Trouble concentrating 1 0 1 1 -  Moving slowly or fidgety/restless 1 0 2 0 -  Suicidal thoughts 1 0 0 0 -  PHQ-9 Score 17 12 14 13  -  Difficult doing work/chores - Very difficult - - -  Some recent data might be hidden    Patient's last menstrual period was 03/29/2018. Last pap <21yo. Results were: n/a Review of Systems:   Pertinent items are noted in HPI Denies cramping/contractions, leakage of fluid, vaginal bleeding, abnormal vaginal discharge w/ itching/odor/irritation, headaches, visual changes, shortness of breath, chest pain, abdominal pain, severe nausea/vomiting, or problems with urination or bowel movements unless otherwise stated above.  Pertinent History Reviewed:    Reviewed past medical,surgical, social, obstetrical and family history.  Reviewed problem list, medications and allergies. OB History  Gravida Para Term Preterm AB Living  3 1 1  0 1 1  SAB TAB Ectopic Multiple Live Births  1 0 0 0 1    # Outcome Date GA Lbr Len/2nd Weight Sex Delivery Anes PTL Lv  3 Current           2 Term 10/28/17 [redacted]w[redacted]d 10:29 / 01:39 7 lb 4.8 oz (3.311 kg) M Vag-Spont EPI  LIV     Complications: Shoulder Dystocia  1 SAB 09/11/16 [redacted]w[redacted]d          Physical Assessment:   Vitals:   06/06/18 1405  BP: 121/76  Pulse: 90  Weight: 180 lb (81.6 kg)  Body mass index is 36.36 kg/m.       Physical Examination:  General appearance - well appearing, and in no distress  Mental status - alert, oriented to person, place, and time  Psych:  She has a normal mood and affect  Skin - warm and dry, normal color, no suspicious lesions noted  Chest - effort normal, all lung fields clear to auscultation bilaterally  Heart - normal rate and regular rhythm  Abdomen - soft, nontender  Extremities:  No swelling or varicosities noted  Thin prep pap is not done  Fetal Heart Rate (bpm): +u/s via informal transabdominal u/s  Results for orders placed or performed in  visit on 06/06/18 (from the past 24 hour(s))  POC Urinalysis Dipstick OB   Collection Time: 06/06/18  2:35 PM  Result Value Ref Range   Color, UA     Clarity, UA     Glucose, UA Negative Negative   Bilirubin, UA     Ketones, UA neg    Spec Grav, UA     Blood, UA neg    pH, UA     POC,PROTEIN,UA Negative Negative, Trace, Small (1+), Moderate (2+), Large (3+), 4+   Urobilinogen, UA     Nitrite, UA neg    Leukocytes, UA Negative Negative   Appearance     Odor      Assessment & Plan:  1) Low-Risk Pregnancy G3P1011 at [redacted]w[redacted]d with an Estimated Date of Delivery: 01/12/19   2) Initial OB visit  3) H/O ICP  4) H/O A2DM> will get A1C today  5) H/O Shoulder dystocia> plan EFW ~ 36wks  6) 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 escitalopram (LEXAPRO) 10 MG tablet    Sig: Take 1 tablet (10 mg total) by mouth daily.    Dispense:  30 tablet    Refill:  6    Order Specific Question:   Supervising Provider    Answer:   [name] [[id number]]    Initial labs obtained Continue prenatal vitamins Reviewed n/v relief measures and warning s/s to report Reviewed recommended weight gain based on pre-gravid BMI Encouraged well-balanced diet Genetic Screening discussed Integrated Screen: requested Cystic fibrosis screening discussed results reviewed Ultrasound discussed; fetal survey: requested CCNC completed>spoke w/ Vicente Males Flu shot today  Follow-up: Return in about 1 month (around 07/07/2018) for LROB, US:NT+1stIT.   Orders Placed This Encounter  Procedures  . GC/Chlamydia Probe Amp  . Urine Culture  . US Fetal Nuchal Translucency Measurement  . Flu Vaccine QUAD 36+ mos IM  . Obstetric Panel, Including HIV  . Urinalysis, Routine w reflex microscopic  . Sickle cell screen  . Pain Management Screening Profile (10S)  . Hemoglobin A1c  . POC Urinalysis Dipstick OB    Roma Schanz CNM, [facility] 06/06/2018 2:57 PM

## 2018-06-07 ENCOUNTER — Encounter: Payer: Self-pay | Admitting: *Deleted

## 2018-06-07 LAB — OBSTETRIC PANEL, INCLUDING HIV
Antibody Screen: NEGATIVE
Basophils Absolute: 0 10*3/uL (ref 0.0–0.2)
Basos: 1 %
EOS (ABSOLUTE): 0.2 10*3/uL (ref 0.0–0.4)
EOS: 3 %
HIV SCREEN 4TH GENERATION: NONREACTIVE
Hematocrit: 37.1 % (ref 34.0–46.6)
Hemoglobin: 12.8 g/dL (ref 11.1–15.9)
Hepatitis B Surface Ag: NEGATIVE
IMMATURE GRANULOCYTES: 0 %
Immature Grans (Abs): 0 10*3/uL (ref 0.0–0.1)
LYMPHS ABS: 1.9 10*3/uL (ref 0.7–3.1)
Lymphs: 23 %
MCH: 28.3 pg (ref 26.6–33.0)
MCHC: 34.5 g/dL (ref 31.5–35.7)
MCV: 82 fL (ref 79–97)
MONOS ABS: 0.8 10*3/uL (ref 0.1–0.9)
Monocytes: 10 %
NEUTROS ABS: 5.2 10*3/uL (ref 1.4–7.0)
NEUTROS PCT: 63 %
PLATELETS: 229 10*3/uL (ref 150–450)
RBC: 4.53 x10E6/uL (ref 3.77–5.28)
RDW: 13.6 % (ref 11.7–15.4)
RH TYPE: POSITIVE
RPR Ser Ql: NONREACTIVE
Rubella Antibodies, IGG: 1.04 index (ref >0.99)
WBC: 8.2 10*3/uL (ref 3.4–10.8)

## 2018-06-07 LAB — MICROSCOPIC EXAMINATION: CASTS: NONE SEEN /LPF

## 2018-06-07 LAB — PMP SCREEN PROFILE (10S), URINE
Amphetamine Scrn, Ur: NEGATIVE ng/mL
BARBITURATE SCREEN URINE: NEGATIVE ng/mL
BENZODIAZEPINE SCREEN, URINE: NEGATIVE ng/mL
CANNABINOIDS UR QL SCN: NEGATIVE ng/mL
CREATININE(CRT), U: 183.5 mg/dL (ref 20.0–300.0)
Cocaine (Metab) Scrn, Ur: NEGATIVE ng/mL
Methadone Screen, Urine: NEGATIVE ng/mL
OXYCODONE+OXYMORPHONE UR QL SCN: NEGATIVE ng/mL
Opiate Scrn, Ur: NEGATIVE ng/mL
PH UR, DRUG SCRN: 6.4 (ref 4.5–8.9)
Phencyclidine Qn, Ur: NEGATIVE ng/mL
Propoxyphene Scrn, Ur: NEGATIVE ng/mL

## 2018-06-07 LAB — SICKLE CELL SCREEN: SICKLE CELL SCREEN: NEGATIVE

## 2018-06-07 LAB — URINALYSIS, ROUTINE W REFLEX MICROSCOPIC
Bilirubin, UA: NEGATIVE
GLUCOSE, UA: NEGATIVE
Ketones, UA: NEGATIVE
Nitrite, UA: NEGATIVE
PH UA: 6 (ref 5.0–7.5)
PROTEIN UA: NEGATIVE
RBC, UA: NEGATIVE
Specific Gravity, UA: 1.025 (ref 1.005–1.030)
UUROB: 0.2 mg/dL (ref 0.2–1.0)

## 2018-06-07 LAB — HEMOGLOBIN A1C
ESTIMATED AVERAGE GLUCOSE: 94 mg/dL
HEMOGLOBIN A1C: 4.9 % (ref 4.8–5.6)

## 2018-06-07 NOTE — Addendum Note (Signed)
Addended by: Octaviano Glow on: 06/07/2018 01:43 PM   Modules accepted: Orders

## 2018-06-08 LAB — URINE CULTURE

## 2018-06-09 LAB — GC/CHLAMYDIA PROBE AMP
Chlamydia trachomatis, NAA: NEGATIVE
Neisseria gonorrhoeae by PCR: NEGATIVE

## 2018-06-12 ENCOUNTER — Other Ambulatory Visit: Payer: Self-pay

## 2018-06-12 ENCOUNTER — Encounter (HOSPITAL_COMMUNITY): Payer: Self-pay | Admitting: *Deleted

## 2018-06-12 ENCOUNTER — Emergency Department (HOSPITAL_COMMUNITY)
Admission: EM | Admit: 2018-06-12 | Discharge: 2018-06-12 | Disposition: A | Discharge status: Home / Self Care | Payer: Medicaid Other | Attending: Emergency Medicine | Admitting: Emergency Medicine

## 2018-06-12 ENCOUNTER — Other Ambulatory Visit: Payer: Self-pay | Admitting: Women's Health

## 2018-06-12 DIAGNOSIS — R531 Weakness: Secondary | ICD-10-CM | POA: Diagnosis present

## 2018-06-12 DIAGNOSIS — Z5321 Procedure and treatment not carried out due to patient leaving prior to being seen by health care provider: Secondary | ICD-10-CM | POA: Diagnosis not present

## 2018-06-12 MED ORDER — AMPICILLIN 500 MG PO CAPS: 500.0000 mg | ORAL_CAPSULE | Freq: Four times a day (QID) | ORAL | 0 refills | Status: DC

## 2018-06-12 NOTE — ED Triage Notes (Signed)
Pt c/o weakness, headache, n/v that became worse yesterday,

## 2018-06-15 ENCOUNTER — Telehealth: Payer: Self-pay | Admitting: *Deleted

## 2018-06-15 NOTE — Telephone Encounter (Signed)
Patient informed urine culture shows UTI and antibiotics have been sent to pharmacy and to take all even if no symptoms. PT verbalized understanding.

## 2018-06-21 ENCOUNTER — Telehealth: Payer: Self-pay | Admitting: Women's Health

## 2018-06-21 NOTE — Telephone Encounter (Signed)
[place] [facility] meds not working is there something else she can take/ taking diclegis/ still vomiting and nausea had tried zofran also and did not work

## 2018-06-21 NOTE — Telephone Encounter (Signed)
Update/ called pt back and told her the the Diclegis was all she could take at this point in her preg/ she said she would like phenergan had taken before and worked a little/ she will take at night

## 2018-06-22 ENCOUNTER — Other Ambulatory Visit: Payer: Self-pay | Admitting: Advanced Practice Midwife

## 2018-06-22 MED ORDER — PROMETHAZINE HCL 25 MG PO TABS: 25.0000 mg | ORAL_TABLET | Freq: Four times a day (QID) | ORAL | 1 refills | Status: DC | PRN

## 2018-06-30 ENCOUNTER — Other Ambulatory Visit: Payer: Self-pay

## 2018-06-30 ENCOUNTER — Encounter (HOSPITAL_COMMUNITY): Payer: Self-pay

## 2018-06-30 ENCOUNTER — Emergency Department (HOSPITAL_COMMUNITY)
Admission: EM | Admit: 2018-06-30 | Discharge: 2018-06-30 | Disposition: A | Discharge status: Home / Self Care | Payer: Medicaid Other | Attending: Emergency Medicine | Admitting: Emergency Medicine

## 2018-06-30 ENCOUNTER — Ambulatory Visit (INDEPENDENT_AMBULATORY_CARE_PROVIDER_SITE_OTHER): Payer: Medicaid Other | Admitting: *Deleted

## 2018-06-30 ENCOUNTER — Encounter: Payer: Self-pay | Admitting: *Deleted

## 2018-06-30 ENCOUNTER — Telehealth: Payer: Self-pay | Admitting: *Deleted

## 2018-06-30 VITALS — BP 111/66 | HR 83 | Wt 179.0 lb

## 2018-06-30 DIAGNOSIS — O26899 Other specified pregnancy related conditions, unspecified trimester: Secondary | ICD-10-CM | POA: Diagnosis not present

## 2018-06-30 DIAGNOSIS — Z3A12 12 weeks gestation of pregnancy: Secondary | ICD-10-CM | POA: Insufficient documentation

## 2018-06-30 DIAGNOSIS — Z331 Pregnant state, incidental: Secondary | ICD-10-CM

## 2018-06-30 DIAGNOSIS — F909 Attention-deficit hyperactivity disorder, unspecified type: Secondary | ICD-10-CM | POA: Insufficient documentation

## 2018-06-30 DIAGNOSIS — Z87891 Personal history of nicotine dependence: Secondary | ICD-10-CM | POA: Diagnosis not present

## 2018-06-30 DIAGNOSIS — R109 Unspecified abdominal pain: Secondary | ICD-10-CM | POA: Insufficient documentation

## 2018-06-30 DIAGNOSIS — J45909 Unspecified asthma, uncomplicated: Secondary | ICD-10-CM | POA: Insufficient documentation

## 2018-06-30 DIAGNOSIS — R8271 Bacteriuria: Secondary | ICD-10-CM

## 2018-06-30 DIAGNOSIS — Z1389 Encounter for screening for other disorder: Secondary | ICD-10-CM

## 2018-06-30 DIAGNOSIS — Z79899 Other long term (current) drug therapy: Secondary | ICD-10-CM | POA: Diagnosis not present

## 2018-06-30 LAB — POCT URINALYSIS DIPSTICK OB
GLUCOSE, UA: NEGATIVE
Ketones, UA: NEGATIVE
LEUKOCYTES UA: NEGATIVE
Nitrite, UA: NEGATIVE
POC,PROTEIN,UA: NEGATIVE
RBC UA: NEGATIVE

## 2018-06-30 LAB — URINALYSIS, ROUTINE W REFLEX MICROSCOPIC
BILIRUBIN URINE: NEGATIVE
GLUCOSE, UA: NEGATIVE mg/dL
HGB URINE DIPSTICK: NEGATIVE
Ketones, ur: NEGATIVE mg/dL
NITRITE: NEGATIVE
PROTEIN: NEGATIVE mg/dL
SPECIFIC GRAVITY, URINE: 1.016 (ref 1.005–1.030)
pH: 6 (ref 5.0–8.0)

## 2018-06-30 MED ORDER — ACETAMINOPHEN 500 MG PO TABS
1000.0000 mg | ORAL_TABLET | Freq: Once | ORAL | Status: AC
  Administered 2018-06-30: 1000 mg via ORAL
  Filled 2018-06-30: qty 2

## 2018-06-30 MED ORDER — AMOXICILLIN 500 MG PO CAPS: 500.0000 mg | ORAL_CAPSULE | Freq: Three times a day (TID) | ORAL | 0 refills | Status: DC

## 2018-06-30 MED ORDER — AMOXICILLIN 250 MG PO CAPS
500.0000 mg | ORAL_CAPSULE | Freq: Once | ORAL | Status: AC
  Administered 2018-06-30: 500 mg via ORAL
  Filled 2018-06-30: qty 2

## 2018-06-30 NOTE — ED Notes (Signed)
Fetal Heart Tones detected. FHR 164 beats/minute

## 2018-06-30 NOTE — ED Provider Notes (Signed)
[facility] EMERGENCY DEPARTMENT Provider Note   CSN: [id number] Arrival date & time: 06/30/18  0515    History   Chief Complaint Chief Complaint  Patient presents with  . Abdominal Pain    [redacted] weeks pregnant    HPI Stacey Cook is a 23 y.o. female.     The history is provided by the patient and medical records.  Abdominal Pain  Pain location:  Suprapubic Pain quality: aching, cramping and sharp   Pain radiates to:  Does not radiate Pain severity:  Mild Onset quality:  Gradual Duration:  8 hours Timing:  Constant Progression:  Worsening Chronicity:  New Relieved by:  None tried Worsened by:  Nothing Associated symptoms: hematuria     Past Medical History:  Diagnosis Date  . Abdominal pain   . ADD (attention deficit disorder)   . Anginal pain ([place])   . Anxiety   . Arthritis   . Asthma   . 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 Cervicalgia   . Cholestasis during pregnancy   . Chronic abdominal pain   . Complication of anesthesia    light headed  . Constipation   . Depression    not on meds  . Depression with anxiety 06/13/2017   06/13/17 rx zoloft 25mg  daily           06/06/18 w/ occ SI, rx Lexapro 10mg , [facility] for therapy  . Ear mass   . Gestational diabetes    pt states not been checking sugars regularly at home; nor has she been taking her Metformin  . Gestational diabetes   . Headache   . HSV infection   . Low iron   . PONV (postoperative nausea and vomiting)   . Suicidal intent   . UTI (lower urinary tract infection) 05/2014  . Vomiting     Patient Active Problem List   Diagnosis Date Noted  . Supervision of normal pregnancy 06/06/2018  . Short interval between pregnancies affecting pregnancy in first trimester, antepartum 05/19/2018  . 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 Gastroesophageal reflux disease   . Acute gallstone  pancreatitis 01/12/2018  . Pancreatitis 01/12/2018  . History of shoulder dystocia in prior pregnancy 12/02/2017  . History of postpartum hemorrhage 12/02/2017  . Acute blood loss anemia 10/30/2017  . Postpartum anemia 10/29/2017  . History of cholestasis during pregnancy 09/21/2017  . History of gestational diabetes 08/24/2017  . Chest pain due to GERD 08/05/2017  . Depression with anxiety and suicidal ideations 06/13/2017  . History of miscarriage 01/18/2017  . Conductive hearing loss, unilat, unrestrict hearing contralateral side 12/10/2015  . Severe episode of recurrent major depressive disorder, without psychotic features ([place])   . 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 Asthma, mild intermittent 02/18/2015  . 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 Perforation of left tympanic membrane 06/19/2014  . Cervicalgia 12/20/2013  . Abdominal pain, chronic, epigastric 08/08/2013  . Acne 08/22/2012  . GAD (generalized anxiety disorder) 08/01/2012  . ADHD (attention deficit hyperactivity disorder), combined type 08/01/2012  . ODD (oppositional defiant disorder) 08/01/2012  . Vasovagal syncope 02/28/2012  . Cholesteatoma of attic of right ear 02/15/2011    Past Surgical History:  Procedure Laterality Date  . CHOLECYSTECTOMY N/A 01/16/2018   Procedure: LAPAROSCOPIC CHOLECYSTECTOMY;  Surgeon:  Aviva Signs, MD;  Location: [facility];  Service: General;  Laterality: N/A;  per Dr. Arnoldo Morale, pt will most likely go home and he will tell her to arrive at 10:45 - already has labs  . CHOLESTEATOMA EXCISION    . DILATION AND EVACUATION N/A 09/11/2016   Procedure: DILATATION AND CURRATAGE  2ND TRIMESTER;  Surgeon: Jonnie Kind, MD;  Location: [facility];  Service: Gynecology;  Laterality: N/A;  . IMPLANTATION BONE ANCHORED HEARING AID Right 04/2013  . INNER EAR SURGERY Right  03/31/2018  . 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 TYMPANOPLASTY Left   . TYMPANOSTOMY       OB History    Gravida  3   Para  1   Term  1   Preterm  0   AB  1   Living  1     SAB  1   TAB  0   Ectopic  0   Multiple  0   Live Births  1            Home Medications    Prior to Admission medications   Medication Sig Start Date End Date Taking? Authorizing Provider  prenatal vitamin w/FE, FA (PRENATAL 1 + 1) 27-1 MG TABS tablet Take 1 tablet by mouth daily at 12 noon. 05/19/18  Yes Estill Dooms, NP  promethazine (PHENERGAN) 25 MG tablet Take 1 tablet (25 mg total) by mouth every 6 (six) hours as needed for nausea or vomiting. 06/22/18  Yes Cresenzo-Dishmon, Joaquim Lai, CNM  acetaminophen (TYLENOL) 500 MG tablet Take 500 mg by mouth as needed.    [provider]  amoxicillin (AMOXIL) 500 MG capsule Take 1 capsule (500 mg total) by mouth 3 (three) times daily. 06/30/18   Joeangel Jeanpaul, Corene Cornea, MD  ampicillin (PRINCIPEN) 500 MG capsule Take 1 capsule (500 mg total) by mouth 4 (four) times daily. X 7 days 06/12/18   Roma Schanz, CNM  escitalopram (LEXAPRO) 10 MG tablet Take 1 tablet (10 mg total) by mouth daily. 06/06/18   Roma Schanz, CNM    Family History Family History  Problem Relation Age of Onset  . Cholelithiasis Mother   . Kidney disease Mother        stones  . Depression Mother   . Hypertension Maternal Grandmother   . Diabetes Maternal Grandmother   . Stroke Maternal Grandmother   . Seizures Maternal Grandmother   . Asthma Maternal Grandmother   . Hyperlipidemia Maternal Grandmother   . Thyroid disease Maternal Grandmother   . Asthma Brother   . Seizures Maternal Uncle   . Cancer Other        breast- great aunt  . Celiac disease Neg Hx     Social History Social History   Tobacco Use  . Smoking status: Former Smoker    Packs/day: 0.50    Years: 1.00    Pack years: 0.50    Types: Cigarettes    Last attempt  to quit: 03/11/2017    Years since quitting: 1.3  . Smokeless tobacco: Never Used  Substance Use Topics  . Alcohol use: No    Alcohol/week: 0.0 standard drinks  . Drug use: No     Allergies   Fentanyl; Keflex [cephalexin]; Other; and Adhesive [tape]   Review of Systems Review of Systems  Gastrointestinal: Positive for abdominal pain.  Genitourinary: Positive for hematuria.  All other systems reviewed and are negative.    Physical Exam  Updated Vital Signs BP (!) 115/49 (BP Location: Left Arm)   Pulse 88   Temp 98.2 F (36.8 C) (Oral)   Resp 18   Ht 4\' 11"  (1.499 m)   Wt 83.9 kg   LMP 03/29/2018   SpO2 98%   BMI 37.37 kg/m   Physical Exam Vitals signs and nursing note reviewed.  Constitutional:      Appearance: She is well-developed.  HENT:     Head: Normocephalic and atraumatic.     Mouth/Throat:     Mouth: Mucous membranes are moist.  Eyes:     Extraocular Movements: Extraocular movements intact.     Conjunctiva/sclera: Conjunctivae normal.     Pupils: Pupils are equal, round, and reactive to light.  Neck:     Musculoskeletal: Normal range of motion.  Cardiovascular:     Rate and Rhythm: Normal rate and regular rhythm.  Pulmonary:     Effort: No respiratory distress.     Breath sounds: No stridor.  Abdominal:     General: There is no distension.  Musculoskeletal: Normal range of motion.        General: No swelling or tenderness.  Skin:    General: Skin is warm and dry.  Neurological:     General: No focal deficit present.     Mental Status: She is alert.      ED Treatments / Results  Labs (all labs ordered are listed, but only abnormal results are displayed) Labs Reviewed  URINALYSIS, ROUTINE W REFLEX MICROSCOPIC - Abnormal; Notable for the following components:      Result Value   APPearance HAZY (*)    Leukocytes,Ua SMALL (*)    Bacteria, UA RARE (*)    All other components within normal limits  URINE CULTURE  HCG, QUANTITATIVE,  PREGNANCY    EKG None  Radiology No results found.  Procedures Procedures (including critical care time)    Medications Ordered in ED Medications  acetaminophen (TYLENOL) tablet 1,000 mg (1,000 mg Oral Given 06/30/18 0530)  amoxicillin (AMOXIL) capsule 500 mg (500 mg Oral Given 06/30/18 3734)     Initial Impression / Assessment and Plan / ED Course  I have reviewed the triage vital signs and the nursing notes.  Pertinent labs & imaging results that were available during my care of the patient were reviewed by me and considered in my medical decision making (see chart for details).        BUS with IUP and heart flutter (not measurable). Urine with some questionable infection, will treat with amoxicillin. H/o miscarriage and feels similar, so will fu w/ obstetrician for further management.   Final Clinical Impressions(s) / ED Diagnoses   Final diagnoses:  Abdominal pain, unspecified abdominal location  Bacteriuria    ED Discharge Orders         Ordered    amoxicillin (AMOXIL) 500 MG capsule  3 times daily     06/30/18 0638           Rieley Hausman, Corene Cornea, MD 06/30/18 [phone]

## 2018-06-30 NOTE — ED Triage Notes (Signed)
Pt states she awoke with sharp lower abd pain with some cramping.  Pt denies vag bleeding or discharge, but states she noticed some blood in the commode after she urinated.

## 2018-06-30 NOTE — Progress Notes (Signed)
Pt here for fetal heart tone check. Was seen at [facility] ED this morning and they were unable to give her an accurate fetal heart tone. Pt anxious that she may be having another miscarriage. Fetal heart tone of 156 obtained. Advised to keep her next appt. Pt very relieved to her hear heart beat.

## 2018-06-30 NOTE — Telephone Encounter (Signed)
Pt states that she went to [facility] ED and they were unable to obtain an accurate fetal heart tone. Advised that a FHT of 164 was charted. Pt states that the nurse told her that she wasn't sure if the 164 was her heart rate or the babys. Pt states they were unable to get a heart rate on u/s and she is worried. She states they told her to f/u with [name]. Advised that she could come in for [facility] today. Pt verbalized understanding.

## 2018-07-01 LAB — URINE CULTURE

## 2018-07-04 ENCOUNTER — Ambulatory Visit (INDEPENDENT_AMBULATORY_CARE_PROVIDER_SITE_OTHER): Payer: Medicaid Other

## 2018-07-04 ENCOUNTER — Encounter: Payer: Self-pay | Admitting: Women's Health

## 2018-07-04 ENCOUNTER — Ambulatory Visit (INDEPENDENT_AMBULATORY_CARE_PROVIDER_SITE_OTHER): Payer: Medicaid Other | Admitting: Obstetrics & Gynecology

## 2018-07-04 VITALS — BP 114/75 | HR 103 | Wt 178.0 lb

## 2018-07-04 DIAGNOSIS — Z3682 Encounter for antenatal screening for nuchal translucency: Secondary | ICD-10-CM

## 2018-07-04 DIAGNOSIS — Z3481 Encounter for supervision of other normal pregnancy, first trimester: Secondary | ICD-10-CM

## 2018-07-04 DIAGNOSIS — Z3A12 12 weeks gestation of pregnancy: Secondary | ICD-10-CM

## 2018-07-04 DIAGNOSIS — N83209 Unspecified ovarian cyst, unspecified side: Secondary | ICD-10-CM | POA: Insufficient documentation

## 2018-07-04 NOTE — Progress Notes (Signed)
[name] 12+4 wks,measurements c/w dates,crl 63.32 mm,fhr 158 bpm,anterior placenta gr 0,simple left ovarian cyst 6.5 x 5.6 x 4.2 cm,normal right ovary,NB present,NT 1.4 mm

## 2018-07-04 NOTE — Progress Notes (Signed)
   LOW-RISK PREGNANCY VISIT Patient name: Stacey Cook MRN [id number]  Date of birth: December 09, 1995 Chief Complaint:   Routine Prenatal Visit ([name] 1st IT)  History of Present Illness:   Stacey Cook is a 23 y.o. G58P1011 female at [redacted]w[redacted]d with an Estimated Date of Delivery: 01/12/19 being seen today for ongoing management of a low-risk pregnancy.  Today she reports no complaints. Contractions: Not present. Vag. Bleeding: None.  Movement: Absent. denies leaking of fluid. Review of Systems:   Pertinent items are noted in HPI Denies abnormal vaginal discharge w/ itching/odor/irritation, headaches, visual changes, shortness of breath, chest pain, abdominal pain, severe nausea/vomiting, or problems with urination or bowel movements unless otherwise stated above. Pertinent History Reviewed:  Reviewed past medical,surgical, social, obstetrical and family history.  Reviewed problem list, medications and allergies. Physical Assessment:   Vitals:   07/04/18 1540  BP: 114/75  Pulse: (!) 103  Weight: 178 lb (80.7 kg)  Body mass index is 35.95 kg/m.        Physical Examination:   General appearance: Well appearing, and in no distress  Mental status: Alert, oriented to person, place, and time  Skin: Warm & dry  Cardiovascular: Normal heart rate noted  Respiratory: Normal respiratory effort, no distress  Abdomen: Soft, gravid, nontender  Pelvic: Cervical exam deferred         Extremities: Edema: None  Fetal Status: Fetal Heart Rate (bpm): 158   Movement: Absent    No results found for this or any previous visit (from the past 24 hour(s)).  Assessment & Plan:  1) Low-risk pregnancy G3P1011 at [redacted]w[redacted]d with an Estimated Date of Delivery: 01/12/19      Meds: No orders of the defined types were placed in this encounter.  Labs/procedures today:   Plan:  Continue routine obstetrical care   Reviewed:  labor symptoms and general obstetric precautions including but not limited to vaginal bleeding,  contractions, leaking of fluid and fetal movement were reviewed in detail with the patient.  All questions were answered  Follow-up: Return in about 4 weeks (around 08/01/2018) for [place].  Orders Placed This Encounter  Procedures  . Integrated 1   [name] 07/04/2018 3:53 PM

## 2018-07-06 LAB — INTEGRATED 1
Crown Rump Length: 63.3 mm
GEST. AGE ON COLLECTION DATE: 12.6 wk
Maternal Age at EDD: 20.9 yr
NUMBER OF FETUSES: 1
Nuchal Translucency (NT): 1.4 mm
PAPP-A VALUE: 1121.1 ng/mL
WEIGHT: 178 [lb_av]

## 2018-07-15 ENCOUNTER — Other Ambulatory Visit: Payer: Self-pay

## 2018-07-15 ENCOUNTER — Encounter (HOSPITAL_COMMUNITY): Payer: Self-pay

## 2018-07-15 ENCOUNTER — Emergency Department (HOSPITAL_COMMUNITY)
Admission: EM | Admit: 2018-07-15 | Discharge: 2018-07-15 | Disposition: A | Discharge status: Home / Self Care | Payer: Medicaid Other | Attending: Emergency Medicine | Admitting: Emergency Medicine

## 2018-07-15 DIAGNOSIS — Z3A14 14 weeks gestation of pregnancy: Secondary | ICD-10-CM | POA: Diagnosis not present

## 2018-07-15 DIAGNOSIS — O26892 Other specified pregnancy related conditions, second trimester: Secondary | ICD-10-CM | POA: Insufficient documentation

## 2018-07-15 DIAGNOSIS — O26891 Other specified pregnancy related conditions, first trimester: Secondary | ICD-10-CM

## 2018-07-15 DIAGNOSIS — J45909 Unspecified asthma, uncomplicated: Secondary | ICD-10-CM | POA: Diagnosis not present

## 2018-07-15 DIAGNOSIS — Z8632 Personal history of gestational diabetes: Secondary | ICD-10-CM | POA: Insufficient documentation

## 2018-07-15 DIAGNOSIS — R109 Unspecified abdominal pain: Secondary | ICD-10-CM

## 2018-07-15 DIAGNOSIS — R1030 Lower abdominal pain, unspecified: Secondary | ICD-10-CM | POA: Insufficient documentation

## 2018-07-15 DIAGNOSIS — N898 Other specified noninflammatory disorders of vagina: Secondary | ICD-10-CM | POA: Insufficient documentation

## 2018-07-15 DIAGNOSIS — S300XXA Contusion of lower back and pelvis, initial encounter: Secondary | ICD-10-CM

## 2018-07-15 DIAGNOSIS — Z79899 Other long term (current) drug therapy: Secondary | ICD-10-CM | POA: Insufficient documentation

## 2018-07-15 DIAGNOSIS — Z87891 Personal history of nicotine dependence: Secondary | ICD-10-CM | POA: Insufficient documentation

## 2018-07-15 LAB — CBC WITH DIFFERENTIAL/PLATELET
Abs Immature Granulocytes: 0.02 10*3/uL (ref 0.00–0.07)
BASOS PCT: 1 %
Basophils Absolute: 0 10*3/uL (ref 0.0–0.1)
Eosinophils Absolute: 0.2 10*3/uL (ref 0.0–0.5)
Eosinophils Relative: 2 %
HCT: 35.8 % — ABNORMAL LOW (ref 36.0–46.0)
Hemoglobin: 11.8 g/dL — ABNORMAL LOW (ref 12.0–15.0)
Immature Granulocytes: 0 %
Lymphocytes Relative: 25 %
Lymphs Abs: 2.2 10*3/uL (ref 0.7–4.0)
MCH: 28.3 pg (ref 26.0–34.0)
MCHC: 33 g/dL (ref 30.0–36.0)
MCV: 85.9 fL (ref 80.0–100.0)
Monocytes Absolute: 0.7 10*3/uL (ref 0.1–1.0)
Monocytes Relative: 8 %
Neutro Abs: 5.6 10*3/uL (ref 1.7–7.7)
Neutrophils Relative %: 64 %
PLATELETS: 197 10*3/uL (ref 150–400)
RBC: 4.17 MIL/uL (ref 3.87–5.11)
RDW: 13.7 % (ref 11.5–15.5)
WBC: 8.9 10*3/uL (ref 4.0–10.5)
nRBC: 0 % (ref 0.0–0.2)

## 2018-07-15 LAB — BASIC METABOLIC PANEL
ANION GAP: 10 (ref 5–15)
BUN: 8 mg/dL (ref 6–20)
CALCIUM: 9.5 mg/dL (ref 8.9–10.3)
CO2: 22 mmol/L (ref 22–32)
CREATININE: 0.4 mg/dL — AB (ref 0.44–1.00)
Chloride: 105 mmol/L (ref 98–111)
GFR calc non Af Amer: >60 mL/min (ref >60)
Glucose, Bld: 97 mg/dL (ref 70–99)
Potassium: 3.8 mmol/L (ref 3.5–5.1)
Sodium: 137 mmol/L (ref 135–145)

## 2018-07-15 LAB — URINALYSIS, ROUTINE W REFLEX MICROSCOPIC
Bacteria, UA: NONE SEEN
Bilirubin Urine: NEGATIVE
Glucose, UA: NEGATIVE mg/dL
Hgb urine dipstick: NEGATIVE
Ketones, ur: NEGATIVE mg/dL
Nitrite: NEGATIVE
PROTEIN: NEGATIVE mg/dL
Specific Gravity, Urine: 1.02 (ref 1.005–1.030)
pH: 6 (ref 5.0–8.0)

## 2018-07-15 LAB — I-STAT BETA HCG BLOOD, ED (MC, WL, AP ONLY): I-stat hCG, quantitative: >2000 m[IU]/mL — ABNORMAL HIGH (ref <5)
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
## 2018-07-15 NOTE — Discharge Instructions (Addendum)
You may take Tylenol every 4 hours if needed.  Apply ice packs on and off to your lower back.  Follow-up with your obstetrician next week for recheck if needed.  Return to the emergency room for any worsening symptoms such as increasing pain, persistent vomiting, or vaginal bleeding.
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
## 2018-07-17 LAB — URINE CULTURE

## 2018-07-19 ENCOUNTER — Telehealth: Payer: Self-pay | Admitting: Advanced Practice Midwife

## 2018-07-19 ENCOUNTER — Telehealth: Payer: Self-pay | Admitting: *Deleted

## 2018-07-19 NOTE — Telephone Encounter (Signed)
Pt is 14 weeks and having nose bleed and throat pain and cough can you please advise if she needs work in appt

## 2018-07-19 NOTE — Telephone Encounter (Signed)
LM with gentleman for patient to return my call.

## 2018-07-25 ENCOUNTER — Other Ambulatory Visit: Payer: Self-pay

## 2018-07-25 ENCOUNTER — Encounter: Payer: Self-pay | Admitting: Advanced Practice Midwife

## 2018-07-25 ENCOUNTER — Ambulatory Visit (INDEPENDENT_AMBULATORY_CARE_PROVIDER_SITE_OTHER): Payer: Medicaid Other | Admitting: Advanced Practice Midwife

## 2018-07-25 VITALS — BP 127/76 | HR 109 | Wt 180.0 lb

## 2018-07-25 DIAGNOSIS — Z1379 Encounter for other screening for genetic and chromosomal anomalies: Secondary | ICD-10-CM

## 2018-07-25 DIAGNOSIS — Z1389 Encounter for screening for other disorder: Secondary | ICD-10-CM

## 2018-07-25 DIAGNOSIS — Z3A15 15 weeks gestation of pregnancy: Secondary | ICD-10-CM

## 2018-07-25 DIAGNOSIS — Z363 Encounter for antenatal screening for malformations: Secondary | ICD-10-CM

## 2018-07-25 DIAGNOSIS — N898 Other specified noninflammatory disorders of vagina: Secondary | ICD-10-CM

## 2018-07-25 DIAGNOSIS — Z331 Pregnant state, incidental: Secondary | ICD-10-CM

## 2018-07-25 DIAGNOSIS — Z3482 Encounter for supervision of other normal pregnancy, second trimester: Secondary | ICD-10-CM

## 2018-07-25 LAB — POCT URINALYSIS DIPSTICK OB
Blood, UA: NEGATIVE
Glucose, UA: NEGATIVE
Ketones, UA: NEGATIVE
Nitrite, UA: NEGATIVE
POC,PROTEIN,UA: NEGATIVE

## 2018-07-25 MED ORDER — METRONIDAZOLE 500 MG PO TABS: 500.0000 mg | ORAL_TABLET | Freq: Two times a day (BID) | ORAL | 0 refills | Status: DC

## 2018-07-25 NOTE — Progress Notes (Signed)
  [id number] [redacted]w[redacted]d Estimated Date of Delivery: 01/12/19  Blood pressure 127/76, pulse (!) 109, weight 180 lb (81.6 kg), last menstrual period 03/29/2018, not currently breastfeeding.   BP weight and urine results all reviewed and noted.  Please refer to the obstetrical flow sheet for the fundal height and fetal heart rate documentation:  Patient reports good fetal movement, denies any bleeding and no rupture of membranes symptoms or regular contractions. Patient is without complaints. All questions were answered.   Physical Assessment:   Vitals:   07/25/18 1451  BP: 127/76  Pulse: (!) 109  Weight: 180 lb (81.6 kg)  Body mass index is 36.36 kg/m.        Physical Examination:   General appearance: Well appearing, and in no distress  Mental status: Alert, oriented to person, place, and time  Skin: Warm & dry  Cardiovascular: Normal heart rate noted  Respiratory: Normal respiratory effort, no distress  Abdomen: Soft, gravid, nontender  Pelvic: SSE:  Thin white dc, sl amine odor.  Wet prep few clue, TNTC WBC         Extremities: Edema: Trace  Fetal Status:        150  Results for orders placed or performed in visit on 07/25/18 (from the past 24 hour(s))  POC Urinalysis Dipstick OB   Collection Time: 07/25/18  2:52 PM  Result Value Ref Range   Color, UA     Clarity, UA     Glucose, UA Negative Negative   Bilirubin, UA     Ketones, UA neg    Spec Grav, UA     Blood, UA neg    pH, UA     POC,PROTEIN,UA Negative Negative, Trace, Small (1+), Moderate (2+), Large (3+), 4+   Urobilinogen, UA     Nitrite, UA neg    Leukocytes, UA Small (1+) (A) Negative   Appearance     Odor       Orders Placed This Encounter  Procedures  . GC/Chlamydia Probe Amp  . US OB Comp + 14 Wk  . INTEGRATED 2  . POC Urinalysis Dipstick OB    Plan:  Continued routine obstetrical care,   Return in 4 weeks (on 08/22/2018) for As scheduled.

## 2018-07-25 NOTE — Patient Instructions (Signed)
Stacey Cook, I greatly value your feedback.  If you receive a survey following your visit with [name] today, we appreciate you taking the time to fill it out.  Thanks, Nigel Berthold, CNM     Second Trimester of Pregnancy The second trimester is from week 14 through week 27 (months 4 through 6). The second trimester is often a time when you feel your best. Your body has adjusted to being pregnant, and you begin to feel better physically. Usually, morning sickness has lessened or quit completely, you may have more energy, and you may have an increase in appetite. The second trimester is also a time when the fetus is growing rapidly. At the end of the sixth month, the fetus is about 9 inches long and weighs about 1 pounds. You will likely begin to feel the baby move (quickening) between 16 and 20 weeks of pregnancy. Body changes during your second trimester Your body continues to go through many changes during your second trimester. The changes vary from woman to woman.  Your weight will continue to increase. You will notice your lower abdomen bulging out.  You may begin to get stretch marks on your hips, abdomen, and breasts.  You may develop headaches that can be relieved by medicines. The medicines should be approved by your health care provider.  You may urinate more often because the fetus is pressing on your bladder.  You may develop or continue to have heartburn as a result of your pregnancy.  You may develop constipation because certain hormones are causing the muscles that push waste through your intestines to slow down.  You may develop hemorrhoids or swollen, bulging veins (varicose veins).  You may have back pain. This is caused by: ? Weight gain. ? Pregnancy hormones that are relaxing the joints in your pelvis. ? A shift in weight and the muscles that support your balance.  Your breasts will continue to grow and they will continue to become tender.  Your gums may  bleed and may be sensitive to brushing and flossing.  Dark spots or blotches (chloasma, mask of pregnancy) may develop on your face. This will likely fade after the baby is born.  A dark line from your belly button to the pubic area (linea nigra) may appear. This will likely fade after the baby is born.  You may have changes in your hair. These can include thickening of your hair, rapid growth, and changes in texture. Some women also have hair loss during or after pregnancy, or hair that feels dry or thin. Your hair will most likely return to normal after your baby is born.  What to expect at prenatal visits During a routine prenatal visit:  You will be weighed to make sure you and the fetus are growing normally.  Your blood pressure will be taken.  Your abdomen will be measured to track your baby's growth.  The fetal heartbeat will be listened to.  Any test results from the previous visit will be discussed.  Your health care provider may ask you:  How you are feeling.  If you are feeling the baby move.  If you have had any abnormal symptoms, such as leaking fluid, bleeding, severe headaches, or abdominal cramping.  If you are using any tobacco products, including cigarettes, chewing tobacco, and electronic cigarettes.  If you have any questions.  Other tests that may be performed during your second trimester include:  Blood tests that check for: ? Low iron levels (anemia). ?  High blood sugar that affects pregnant women (gestational diabetes) between 12 and 28 weeks. ? Rh antibodies. This is to check for a protein on red blood cells (Rh factor).  Urine tests to check for infections, diabetes, or protein in the urine.  An ultrasound to confirm the proper growth and development of the baby.  An amniocentesis to check for possible genetic problems.  Fetal screens for spina bifida and Down syndrome.  HIV (human immunodeficiency virus) testing. Routine prenatal testing  includes screening for HIV, unless you choose not to have this test.  Follow these instructions at home: Medicines  Follow your health care provider's instructions regarding medicine use. Specific medicines may be either safe or unsafe to take during pregnancy.  Take a prenatal vitamin that contains at least 600 micrograms (mcg) of folic acid.  If you develop constipation, try taking a stool softener if your health care provider approves. Eating and drinking  Eat a balanced diet that includes fresh fruits and vegetables, whole grains, good sources of protein such as meat, eggs, or tofu, and low-fat dairy. Your health care provider will help you determine the amount of weight gain that is right for you.  Avoid raw meat and uncooked cheese. These carry germs that can cause birth defects in the baby.  If you have low calcium intake from food, talk to your health care provider about whether you should take a daily calcium supplement.  Limit foods that are high in fat and processed sugars, such as fried and sweet foods.  To prevent constipation: ? Drink enough fluid to keep your urine clear or pale yellow. ? Eat foods that are high in fiber, such as fresh fruits and vegetables, whole grains, and beans. Activity  Exercise only as directed by your health care provider. Most women can continue their usual exercise routine during pregnancy. Try to exercise for 30 minutes at least 5 days a week. Stop exercising if you experience uterine contractions.  Avoid heavy lifting, wear low heel shoes, and practice good posture.  A sexual relationship may be continued unless your health care provider directs you otherwise. Relieving pain and discomfort  Wear a good support bra to prevent discomfort from breast tenderness.  Take warm sitz baths to soothe any pain or discomfort caused by hemorrhoids. Use hemorrhoid cream if your health care provider approves.  Rest with your legs elevated if you have  leg cramps or low back pain.  If you develop varicose veins, wear support hose. Elevate your feet for 15 minutes, 3-4 times a day. Limit salt in your diet. Prenatal Care  Write down your questions. Take them to your prenatal visits.  Keep all your prenatal visits as told by your health care provider. This is important. Safety  Wear your seat belt at all times when driving.  Make a list of emergency phone numbers, including numbers for family, friends, the hospital, and police and fire departments. General instructions  Ask your health care provider for a referral to a local prenatal education class. Begin classes no later than the beginning of month 6 of your pregnancy.  Ask for help if you have counseling or nutritional needs during pregnancy. Your health care provider can offer advice or refer you to specialists for help with various needs.  Do not use hot tubs, steam rooms, or saunas.  Do not douche or use tampons or scented sanitary pads.  Do not cross your legs for long periods of time.  Avoid cat litter boxes and  soil used by cats. These carry germs that can cause birth defects in the baby and possibly loss of the fetus by miscarriage or stillbirth.  Avoid all smoking, herbs, alcohol, and unprescribed drugs. Chemicals in these products can affect the formation and growth of the baby.  Do not use any products that contain nicotine or tobacco, such as cigarettes and e-cigarettes. If you need help quitting, ask your health care provider.  Visit your dentist if you have not gone yet during your pregnancy. Use a soft toothbrush to brush your teeth and be gentle when you floss. Contact a health care provider if:  You have dizziness.  You have mild pelvic cramps, pelvic pressure, or nagging pain in the abdominal area.  You have persistent nausea, vomiting, or diarrhea.  You have a bad smelling vaginal discharge.  You have pain when you urinate. Get help right away if:  You  have a fever.  You are leaking fluid from your vagina.  You have spotting or bleeding from your vagina.  You have severe abdominal cramping or pain.  You have rapid weight gain or weight loss.  You have shortness of breath with chest pain.  You notice sudden or extreme swelling of your face, hands, ankles, feet, or legs.  You have not felt your baby move in over an hour.  You have severe headaches that do not go away when you take medicine.  You have vision changes. Summary  The second trimester is from week 14 through week 27 (months 4 through 6). It is also a time when the fetus is growing rapidly.  Your body goes through many changes during pregnancy. The changes vary from woman to woman.  Avoid all smoking, herbs, alcohol, and unprescribed drugs. These chemicals affect the formation and growth your baby.  Do not use any tobacco products, such as cigarettes, chewing tobacco, and e-cigarettes. If you need help quitting, ask your health care provider.  Contact your health care provider if you have any questions. Keep all prenatal visits as told by your health care provider. This is important. This information is not intended to replace advice given to you by your health care provider. Make sure you discuss any questions you have with your health care provider.      CHILDBIRTH CLASSES [phone] is the phone number for Pregnancy Classes or hospital tours at [place] will be referred to  [email or web address] for more information on childbirth classes  At this site you may register for classes. You may sign up for a waiting list if classes are full. Please SIGN UP FOR THIS!.   When the waiting list becomes long, sometimes new classes can be added.

## 2018-07-26 LAB — INTEGRATED 2
AFP MARKER: 27.1 ng/mL
AFP MoM: 1.04
Crown Rump Length: 63.3 mm
DIA MoM: 0.68
DIA Value: 105.6 pg/mL
Estriol, Unconjugated: 0.81 ng/mL
Gest. Age on Collection Date: 12.6 weeks
Gestational Age: 15.6 weeks
Maternal Age at EDD: 20.9 yr
Nuchal Translucency (NT): 1.4 mm
Nuchal Translucency MoM: 0.99
Number of Fetuses: 1
PAPP-A MoM: 1.4
PAPP-A Value: 1121.1 ng/mL
Test Results:: NEGATIVE
Weight: 178 [lb_av]
Weight: 178 [lb_av]
hCG MoM: 0.97
hCG Value: 36.3 IU/mL
uE3 MoM: 1.06

## 2018-07-26 LAB — GC/CHLAMYDIA PROBE AMP
CHLAMYDIA, DNA PROBE: NEGATIVE
Neisseria gonorrhoeae by PCR: NEGATIVE

## 2018-08-04 ENCOUNTER — Other Ambulatory Visit: Payer: Self-pay

## 2018-08-04 ENCOUNTER — Encounter (HOSPITAL_COMMUNITY): Payer: Self-pay | Admitting: *Deleted

## 2018-08-04 ENCOUNTER — Inpatient Hospital Stay (HOSPITAL_COMMUNITY)
Admission: AD | Admit: 2018-08-04 | Discharge: 2018-08-04 | Disposition: A | Discharge status: Home / Self Care | Payer: Medicaid Other | Attending: Obstetrics and Gynecology | Admitting: Obstetrics and Gynecology

## 2018-08-04 DIAGNOSIS — R102 Pelvic and perineal pain: Secondary | ICD-10-CM | POA: Diagnosis present

## 2018-08-04 DIAGNOSIS — Z881 Allergy status to other antibiotic agents status: Secondary | ICD-10-CM | POA: Diagnosis not present

## 2018-08-04 DIAGNOSIS — O99342 Other mental disorders complicating pregnancy, second trimester: Secondary | ICD-10-CM | POA: Diagnosis not present

## 2018-08-04 DIAGNOSIS — O99512 Diseases of the respiratory system complicating pregnancy, second trimester: Secondary | ICD-10-CM | POA: Insufficient documentation

## 2018-08-04 DIAGNOSIS — Z87891 Personal history of nicotine dependence: Secondary | ICD-10-CM | POA: Insufficient documentation

## 2018-08-04 DIAGNOSIS — Z885 Allergy status to narcotic agent status: Secondary | ICD-10-CM | POA: Insufficient documentation

## 2018-08-04 DIAGNOSIS — F418 Other specified anxiety disorders: Secondary | ICD-10-CM | POA: Diagnosis not present

## 2018-08-04 DIAGNOSIS — Z818 Family history of other mental and behavioral disorders: Secondary | ICD-10-CM | POA: Diagnosis not present

## 2018-08-04 DIAGNOSIS — N760 Acute vaginitis: Secondary | ICD-10-CM | POA: Diagnosis not present

## 2018-08-04 DIAGNOSIS — N898 Other specified noninflammatory disorders of vagina: Secondary | ICD-10-CM | POA: Diagnosis not present

## 2018-08-04 DIAGNOSIS — O368120 Decreased fetal movements, second trimester, not applicable or unspecified: Secondary | ICD-10-CM | POA: Insufficient documentation

## 2018-08-04 DIAGNOSIS — O23592 Infection of other part of genital tract in pregnancy, second trimester: Secondary | ICD-10-CM

## 2018-08-04 DIAGNOSIS — Z792 Long term (current) use of antibiotics: Secondary | ICD-10-CM | POA: Diagnosis not present

## 2018-08-04 DIAGNOSIS — Z825 Family history of asthma and other chronic lower respiratory diseases: Secondary | ICD-10-CM | POA: Diagnosis not present

## 2018-08-04 DIAGNOSIS — O26892 Other specified pregnancy related conditions, second trimester: Secondary | ICD-10-CM | POA: Diagnosis not present

## 2018-08-04 DIAGNOSIS — Z888 Allergy status to other drugs, medicaments and biological substances status: Secondary | ICD-10-CM | POA: Diagnosis not present

## 2018-08-04 DIAGNOSIS — Z803 Family history of malignant neoplasm of breast: Secondary | ICD-10-CM | POA: Insufficient documentation

## 2018-08-04 DIAGNOSIS — Z3A17 17 weeks gestation of pregnancy: Secondary | ICD-10-CM

## 2018-08-04 DIAGNOSIS — Z9049 Acquired absence of other specified parts of digestive tract: Secondary | ICD-10-CM | POA: Insufficient documentation

## 2018-08-04 DIAGNOSIS — B9689 Other specified bacterial agents as the cause of diseases classified elsewhere: Secondary | ICD-10-CM

## 2018-08-04 DIAGNOSIS — O9989 Other specified diseases and conditions complicating pregnancy, childbirth and the puerperium: Secondary | ICD-10-CM | POA: Insufficient documentation

## 2018-08-04 DIAGNOSIS — Z833 Family history of diabetes mellitus: Secondary | ICD-10-CM | POA: Insufficient documentation

## 2018-08-04 DIAGNOSIS — J45909 Unspecified asthma, uncomplicated: Secondary | ICD-10-CM | POA: Diagnosis not present

## 2018-08-04 DIAGNOSIS — Z79899 Other long term (current) drug therapy: Secondary | ICD-10-CM | POA: Insufficient documentation

## 2018-08-04 DIAGNOSIS — Z91048 Other nonmedicinal substance allergy status: Secondary | ICD-10-CM | POA: Insufficient documentation

## 2018-08-04 LAB — CBC
HEMATOCRIT: 36.1 % (ref 36.0–46.0)
Hemoglobin: 11.9 g/dL — ABNORMAL LOW (ref 12.0–15.0)
MCH: 28.5 pg (ref 26.0–34.0)
MCHC: 33 g/dL (ref 30.0–36.0)
MCV: 86.4 fL (ref 80.0–100.0)
Platelets: 179 10*3/uL (ref 150–400)
RBC: 4.18 MIL/uL (ref 3.87–5.11)
RDW: 14 % (ref 11.5–15.5)
WBC: 11.7 10*3/uL — ABNORMAL HIGH (ref 4.0–10.5)
nRBC: 0 % (ref 0.0–0.2)

## 2018-08-04 LAB — URINALYSIS, ROUTINE W REFLEX MICROSCOPIC
BILIRUBIN URINE: NEGATIVE
Glucose, UA: NEGATIVE mg/dL
Hgb urine dipstick: NEGATIVE
Ketones, ur: NEGATIVE mg/dL
Nitrite: NEGATIVE
Protein, ur: NEGATIVE mg/dL
Specific Gravity, Urine: 1.018 (ref 1.005–1.030)
pH: 5 (ref 5.0–8.0)

## 2018-08-04 LAB — WET PREP, GENITAL
Sperm: NONE SEEN
Trich, Wet Prep: NONE SEEN
Yeast Wet Prep HPF POC: NONE SEEN

## 2018-08-04 MED ORDER — METRONIDAZOLE 500 MG PO TABS: 500.0000 mg | ORAL_TABLET | Freq: Two times a day (BID) | ORAL | 0 refills | Status: DC

## 2018-08-04 NOTE — MAU Note (Signed)
Patient presents to MAU with dysuria and hematuria that started three days ago.  Also having some low back cramping and pelvic pressure for past few days.  Reports feeling less fetal movement from last week.

## 2018-08-04 NOTE — MAU Provider Note (Signed)
[name] is a 23 y.o. G3P1011 At  [redacted]w[redacted]d here with complaints of vaginal bleeding and back pain; she also says that she has pain when she urinates.  Patient feels that her baby has not been moving a lot. She denies other abnormal discharge. She denies NV, HA.  Patient has a significant psych history and history of multiple MAU and ED visits. She called [facility] today to report that she was having some back pain and urinary pain; she was given Bactrim this morning and then received a follow-up call from [facility] to go to MAU.  History     CSN: [id number]  Arrival date and time: 08/04/18 1627   First Provider Initiated Contact with Patient 08/04/18 1725      Chief Complaint  Patient presents with  . Vaginal Pain  . Abdominal Cramping  . Decreased Fetal Movement   Vaginal Pain  The patient's primary symptoms include vaginal bleeding. This is a new problem. The current episode started in the past 7 days. The problem occurs intermittently. She is pregnant. Associated symptoms include back pain. Pertinent negatives include no abdominal pain. The vaginal discharge was brown. Nothing aggravates the symptoms. She has tried nothing for the symptoms.   Patient also reports some vaginal discharge; she says that she saw dark brown discharge two times when she wiped 4 days ago. She can't tell if it is coming from her vagina or from her urine.  She last had intercourse two weeks ago.  Her partner reports that she is drinking a lot of soda; patient admits that she is drinking a lot of soda. Patient also says that her urinary pain occurs in the morning when she hasn't urinated or when she has  A full bladder.  OB History    Gravida  3   Para  1   Term  1   Preterm  0   AB  1   Living  1     SAB  1   TAB  0   Ectopic  0   Multiple  0   Live Births  1           Past Medical History:  Diagnosis Date  . Abdominal pain   . ADD (attention deficit disorder)   .  Anginal pain ([place])   . Anxiety   . Arthritis   . Asthma   . 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 Cervicalgia   . Cholestasis during pregnancy   . Chronic abdominal pain   . Complication of anesthesia    light headed  . Constipation   . Depression    not on meds  . Depression with anxiety 06/13/2017   06/13/17 rx zoloft 25mg  daily           06/06/18 w/ occ SI, rx Lexapro 10mg , [facility] for therapy  . Ear mass   . Gestational diabetes    pt states not been checking sugars regularly at home; nor has she been taking her Metformin  . Gestational diabetes   . Headache   . HSV infection   . Low iron   . PONV (postoperative nausea and vomiting)   . Suicidal intent   . UTI (lower urinary tract infection) 05/2014  . Vomiting     Past Surgical History:  Procedure Laterality Date  . CHOLECYSTECTOMY N/A 01/16/2018   Procedure: LAPAROSCOPIC CHOLECYSTECTOMY;  Surgeon: Aviva Signs, MD;  Location: [facility];  Service: General;  Laterality:  N/A;  per Dr. Arnoldo Morale, pt will most likely go home and he will tell her to arrive at 10:45 - already has labs  . CHOLESTEATOMA EXCISION    . DILATION AND EVACUATION N/A 09/11/2016   Procedure: DILATATION AND CURRATAGE  2ND TRIMESTER;  Surgeon: Jonnie Kind, MD;  Location: [facility];  Service: Gynecology;  Laterality: N/A;  . IMPLANTATION BONE ANCHORED HEARING AID Right 04/2013  . INNER EAR SURGERY Right 03/31/2018  . 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 TYMPANOPLASTY Left   . TYMPANOSTOMY      Family History  Problem Relation Age of Onset  . Cholelithiasis Mother   . Kidney disease Mother        stones  . Depression Mother   . Hypertension Maternal Grandmother   . Diabetes Maternal Grandmother   . Stroke Maternal Grandmother   . Seizures Maternal Grandmother   . Asthma Maternal Grandmother   . Hyperlipidemia Maternal Grandmother   . Thyroid disease Maternal Grandmother   . Asthma Brother   .  Seizures Maternal Uncle   . Cancer Other        breast- great aunt  . Celiac disease Neg Hx     Social History   Tobacco Use  . Smoking status: Former Smoker    Packs/day: 0.50    Years: 1.00    Pack years: 0.50    Types: Cigarettes    Last attempt to quit: 03/11/2017    Years since quitting: 1.4  . Smokeless tobacco: Never Used  Substance Use Topics  . Alcohol use: No    Alcohol/week: 0.0 standard drinks  . Drug use: No    Allergies:  Allergies  Allergen Reactions  . Fentanyl Nausea And Vomiting  . Keflex [Cephalexin] Other (See Comments)    Reaction:  Elevated liver enzymes   . Other Other (See Comments)    Pt is allergic to surgical glue swelling;  Cats-congestion, sneezing, eyes watery, itching all over Reaction:  Infection   . Adhesive [Tape] Rash    Medications Prior to Admission  Medication Sig Dispense Refill Last Dose  . acetaminophen (TYLENOL) 500 MG tablet Take 500 mg by mouth as needed.   Taking  . escitalopram (LEXAPRO) 10 MG tablet Take 1 tablet (10 mg total) by mouth daily. 30 tablet 6 Taking  . metroNIDAZOLE (FLAGYL) 500 MG tablet Take 1 tablet (500 mg total) by mouth 2 (two) times daily. 14 tablet 0   . prenatal vitamin w/FE, FA (PRENATAL 1 + 1) 27-1 MG TABS tablet Take 1 tablet by mouth daily at 12 noon. 30 each 12 Taking  . promethazine (PHENERGAN) 25 MG tablet Take 1 tablet (25 mg total) by mouth every 6 (six) hours as needed for nausea or vomiting. 30 tablet 1 Taking    Review of Systems  HENT: Negative.   Eyes: Negative.   Gastrointestinal: Negative for abdominal pain.  Genitourinary: Positive for vaginal pain.  Musculoskeletal: Positive for back pain.  Neurological: Negative.   Psychiatric/Behavioral: Negative.    Physical Exam   Blood pressure 125/64, pulse 86, temperature 98.5 F (36.9 C), temperature source Oral, resp. rate 16, weight 82.3 kg, last menstrual period 03/29/2018, SpO2 98 %, not currently breastfeeding.  Physical Exam   Constitutional: She is oriented to person, place, and time. She appears well-developed and well-nourished.  HENT:  Head: Normocephalic.  Eyes: Pupils are equal, round, and reactive to light.  Neck: Normal range of motion.  GI:  Soft.  Positive CVA tenderness  Genitourinary:    Genitourinary Comments: NEFG; white watery discharge in the vagina; no blood, no odor, no CMT but patient does not tolerate bimanual exam.    Musculoskeletal: Normal range of motion.  Neurological: She is alert and oriented to person, place, and time.  Skin: Skin is warm and dry.  Psychiatric: She has a normal mood and affect.  Patient is well-appearing in bed; no   MAU Course  Procedures  MDM -UA shows no signs of infection; however, will send for culture due to patient's back pain. Low index of suspicion for pyelonephritis given that patient has no fever and has clear urine;  -wet prep positive for clue -GC chlamydia pending -CBC is normal -FHR is 152 by Doppler  Assessment and Plan   1. Pelvic pain    2. Patient stable for discharge with RX for Flagyl.   3. Will wait for GC/chlamydia and Urine culture results before prescribing medicine.   4. Patient to keep follow-up appt at [facility] on 4-7  5. All questions answered; patient stable for discharge.    Mervyn Skeeters Santos Hardwick 08/04/2018, 5:25 PM

## 2018-08-04 NOTE — Discharge Instructions (Signed)
Abdominal Pain During Pregnancy ° °Abdominal pain is common during pregnancy, and has many possible causes. Some causes are more serious than others, and sometimes the cause is not known. Abdominal pain can be a sign that labor is starting. It can also be caused by normal growth and stretching of muscles and ligaments during pregnancy. Always tell your health care provider if you have any abdominal pain. °Follow these instructions at home: °· Do not have sex or put anything in your vagina until your pain goes away completely. °· Get plenty of rest until your pain improves. °· Drink enough fluid to keep your urine pale yellow. °· Take over-the-counter and prescription medicines only as told by your health care provider. °· Keep all follow-up visits as told by your health care provider. This is important. °Contact a health care provider if: °· Your pain continues or gets worse after resting. °· You have lower abdominal pain that: °? Comes and goes at regular intervals. °? Spreads to your back. °? Is similar to menstrual cramps. °· You have pain or burning when you urinate. °Get help right away if: °· You have a fever or chills. °· You have vaginal bleeding. °· You are leaking fluid from your vagina. °· You are passing tissue from your vagina. °· You have vomiting or diarrhea that lasts for more than 24 hours. °· Your baby is moving less than usual. °· You feel very weak or faint. °· You have shortness of breath. °· You develop severe pain in your upper abdomen. °Summary °· Abdominal pain is common during pregnancy, and has many possible causes. °· If you experience abdominal pain during pregnancy, tell your health care provider right away. °· Follow your health care provider's home care instructions and keep all follow-up visits as directed. °This information is not intended to replace advice given to you by your health care provider. Make sure you discuss any questions you have with your health care  provider. °Document Released: 04/26/2005 Document Revised: 07/29/2016 Document Reviewed: 07/29/2016 °Elsevier Interactive Patient Education © 2019 Elsevier Inc. ° °

## 2018-08-06 LAB — CULTURE, OB URINE: Culture: <10000 — AB

## 2018-08-07 LAB — GC/CHLAMYDIA PROBE AMP (~~LOC~~) NOT AT ARMC
Chlamydia: NEGATIVE
Neisseria Gonorrhea: NEGATIVE

## 2018-08-08 ENCOUNTER — Telehealth: Payer: Self-pay | Admitting: *Deleted

## 2018-08-08 ENCOUNTER — Inpatient Hospital Stay (HOSPITAL_COMMUNITY): Payer: Medicaid Other

## 2018-08-08 ENCOUNTER — Inpatient Hospital Stay (HOSPITAL_COMMUNITY)
Admission: AD | Admit: 2018-08-08 | Discharge: 2018-08-08 | Disposition: A | Discharge status: Home / Self Care | Payer: Medicaid Other | Attending: Obstetrics & Gynecology | Admitting: Obstetrics & Gynecology

## 2018-08-08 ENCOUNTER — Encounter (HOSPITAL_COMMUNITY): Payer: Self-pay | Admitting: *Deleted

## 2018-08-08 ENCOUNTER — Other Ambulatory Visit: Payer: Self-pay

## 2018-08-08 DIAGNOSIS — O99512 Diseases of the respiratory system complicating pregnancy, second trimester: Secondary | ICD-10-CM | POA: Diagnosis not present

## 2018-08-08 DIAGNOSIS — Z91048 Other nonmedicinal substance allergy status: Secondary | ICD-10-CM | POA: Diagnosis not present

## 2018-08-08 DIAGNOSIS — R1031 Right lower quadrant pain: Secondary | ICD-10-CM

## 2018-08-08 DIAGNOSIS — Z9049 Acquired absence of other specified parts of digestive tract: Secondary | ICD-10-CM | POA: Diagnosis not present

## 2018-08-08 DIAGNOSIS — Z803 Family history of malignant neoplasm of breast: Secondary | ICD-10-CM | POA: Diagnosis not present

## 2018-08-08 DIAGNOSIS — Z888 Allergy status to other drugs, medicaments and biological substances status: Secondary | ICD-10-CM | POA: Diagnosis not present

## 2018-08-08 DIAGNOSIS — O9989 Other specified diseases and conditions complicating pregnancy, childbirth and the puerperium: Secondary | ICD-10-CM | POA: Diagnosis not present

## 2018-08-08 DIAGNOSIS — O26899 Other specified pregnancy related conditions, unspecified trimester: Secondary | ICD-10-CM

## 2018-08-08 DIAGNOSIS — Z885 Allergy status to narcotic agent status: Secondary | ICD-10-CM | POA: Insufficient documentation

## 2018-08-08 DIAGNOSIS — Z792 Long term (current) use of antibiotics: Secondary | ICD-10-CM | POA: Insufficient documentation

## 2018-08-08 DIAGNOSIS — Z881 Allergy status to other antibiotic agents status: Secondary | ICD-10-CM | POA: Diagnosis not present

## 2018-08-08 DIAGNOSIS — Z87891 Personal history of nicotine dependence: Secondary | ICD-10-CM | POA: Insufficient documentation

## 2018-08-08 DIAGNOSIS — Z3A17 17 weeks gestation of pregnancy: Secondary | ICD-10-CM | POA: Diagnosis not present

## 2018-08-08 DIAGNOSIS — J45909 Unspecified asthma, uncomplicated: Secondary | ICD-10-CM | POA: Diagnosis not present

## 2018-08-08 DIAGNOSIS — O26892 Other specified pregnancy related conditions, second trimester: Secondary | ICD-10-CM

## 2018-08-08 DIAGNOSIS — Z79899 Other long term (current) drug therapy: Secondary | ICD-10-CM | POA: Diagnosis not present

## 2018-08-08 DIAGNOSIS — O24419 Gestational diabetes mellitus in pregnancy, unspecified control: Secondary | ICD-10-CM | POA: Insufficient documentation

## 2018-08-08 DIAGNOSIS — Z3492 Encounter for supervision of normal pregnancy, unspecified, second trimester: Secondary | ICD-10-CM

## 2018-08-08 DIAGNOSIS — F418 Other specified anxiety disorders: Secondary | ICD-10-CM | POA: Insufficient documentation

## 2018-08-08 DIAGNOSIS — Z833 Family history of diabetes mellitus: Secondary | ICD-10-CM | POA: Insufficient documentation

## 2018-08-08 DIAGNOSIS — O99342 Other mental disorders complicating pregnancy, second trimester: Secondary | ICD-10-CM | POA: Insufficient documentation

## 2018-08-08 DIAGNOSIS — R109 Unspecified abdominal pain: Secondary | ICD-10-CM

## 2018-08-08 DIAGNOSIS — Z818 Family history of other mental and behavioral disorders: Secondary | ICD-10-CM | POA: Insufficient documentation

## 2018-08-08 DIAGNOSIS — M170 Bilateral primary osteoarthritis of knee: Secondary | ICD-10-CM | POA: Diagnosis not present

## 2018-08-08 HISTORY — DX: Anemia, unspecified: D64.9

## 2018-08-08 LAB — URINALYSIS, ROUTINE W REFLEX MICROSCOPIC
Bilirubin Urine: NEGATIVE
GLUCOSE, UA: NEGATIVE mg/dL
Hgb urine dipstick: NEGATIVE
Ketones, ur: NEGATIVE mg/dL
Nitrite: NEGATIVE
PH: 5 (ref 5.0–8.0)
Protein, ur: NEGATIVE mg/dL
Specific Gravity, Urine: 1.02 (ref 1.005–1.030)

## 2018-08-08 LAB — COMPREHENSIVE METABOLIC PANEL
ALT: 11 U/L (ref 0–44)
AST: 13 U/L — ABNORMAL LOW (ref 15–41)
Albumin: 3 g/dL — ABNORMAL LOW (ref 3.5–5.0)
Alkaline Phosphatase: 94 U/L (ref 38–126)
Anion gap: 7 (ref 5–15)
BUN: 7 mg/dL (ref 6–20)
CO2: 23 mmol/L (ref 22–32)
CREATININE: 0.51 mg/dL (ref 0.44–1.00)
Calcium: 9.1 mg/dL (ref 8.9–10.3)
Chloride: 106 mmol/L (ref 98–111)
GFR calc Af Amer: >60 mL/min (ref >60)
GFR calc non Af Amer: >60 mL/min (ref >60)
Glucose, Bld: 103 mg/dL — ABNORMAL HIGH (ref 70–99)
Potassium: 3.9 mmol/L (ref 3.5–5.1)
Sodium: 136 mmol/L (ref 135–145)
Total Bilirubin: 0.3 mg/dL (ref 0.3–1.2)
Total Protein: 6.7 g/dL (ref 6.5–8.1)

## 2018-08-08 LAB — CBC WITH DIFFERENTIAL/PLATELET
Abs Immature Granulocytes: 0.05 10*3/uL (ref 0.00–0.07)
Basophils Absolute: 0 10*3/uL (ref 0.0–0.1)
Basophils Relative: 0 %
Eosinophils Absolute: 0.2 10*3/uL (ref 0.0–0.5)
Eosinophils Relative: 2 %
HCT: 35.7 % — ABNORMAL LOW (ref 36.0–46.0)
Hemoglobin: 12 g/dL (ref 12.0–15.0)
Immature Granulocytes: 1 %
Lymphocytes Relative: 18 %
Lymphs Abs: 1.7 10*3/uL (ref 0.7–4.0)
MCH: 28.6 pg (ref 26.0–34.0)
MCHC: 33.6 g/dL (ref 30.0–36.0)
MCV: 85.2 fL (ref 80.0–100.0)
MONOS PCT: 7 %
Monocytes Absolute: 0.7 10*3/uL (ref 0.1–1.0)
NEUTROS ABS: 6.8 10*3/uL (ref 1.7–7.7)
Neutrophils Relative %: 72 %
Platelets: 181 10*3/uL (ref 150–400)
RBC: 4.19 MIL/uL (ref 3.87–5.11)
RDW: 13.9 % (ref 11.5–15.5)
WBC: 9.4 10*3/uL (ref 4.0–10.5)
nRBC: 0 % (ref 0.0–0.2)

## 2018-08-08 LAB — LIPASE, BLOOD: Lipase: 30 U/L (ref 11–51)

## 2018-08-08 MED ORDER — ACETAMINOPHEN 500 MG PO TABS
1000.0000 mg | ORAL_TABLET | Freq: Once | ORAL | Status: AC
  Administered 2018-08-08: 1000 mg via ORAL
  Filled 2018-08-08: qty 2

## 2018-08-08 MED ORDER — CYCLOBENZAPRINE HCL 10 MG PO TABS: 10.0000 mg | ORAL_TABLET | Freq: Two times a day (BID) | ORAL | 0 refills | Status: DC | PRN

## 2018-08-08 NOTE — MAU Note (Signed)
Pain in rt lower side woke her this morning.  Has continued.  Every movement hurts.  Can't eat, can't stand.  Called office, was told to come here for labwork.  Hx of cholestasis and elevated liver function tests.

## 2018-08-08 NOTE — Discharge Instructions (Signed)
Abdominal Pain During Pregnancy  Abdominal pain is common during pregnancy, and has many possible causes. Some causes are more serious than others, and sometimes the cause is not known. Abdominal pain can be a sign that labor is starting. It can also be caused by normal growth and stretching of muscles and ligaments during pregnancy. Always tell your health care provider if you have any abdominal pain. Follow these instructions at home:  Do not have sex or put anything in your vagina until your pain goes away completely.  Get plenty of rest until your pain improves.  Drink enough fluid to keep your urine pale yellow.  Take over-the-counter and prescription medicines only as told by your health care provider.  Keep all follow-up visits as told by your health care provider. This is important. Contact a health care provider if:  Your pain continues or gets worse after resting.  You have lower abdominal pain that: ? Comes and goes at regular intervals. ? Spreads to your back. ? Is similar to menstrual cramps.  You have pain or burning when you urinate. Get help right away if:  You have a fever or chills.  You have vaginal bleeding.  You are leaking fluid from your vagina.  You are passing tissue from your vagina.  You have vomiting or diarrhea that lasts for more than 24 hours.  Your baby is moving less than usual.  You feel very weak or faint.  You have shortness of breath.  You develop severe pain in your upper abdomen. Summary  Abdominal pain is common during pregnancy, and has many possible causes.  If you experience abdominal pain during pregnancy, tell your health care provider right away.  Follow your health care provider's home care instructions and keep all follow-up visits as directed. This information is not intended to replace advice given to you by your health care provider. Make sure you discuss any questions you have with your health care  provider. Document Released: 04/26/2005 Document Revised: 07/29/2016 Document Reviewed: 07/29/2016 Elsevier Interactive Patient Education  2019 [employer].

## 2018-08-08 NOTE — MAU Provider Note (Signed)
History     CSN: [id number]  Arrival date and time: 08/08/18 1617   First Provider Initiated Contact with Patient 08/08/18 1653      Chief Complaint  Patient presents with  . Abdominal Pain   HPI Stacey Cook is a 23 y.o. G3P1011 at [redacted]w[redacted]d who presents to MAU with chief complaint of RLQ abdominal pain. Patient states her pain was 10/10 and woke her from sleep this morning. She reports that her pain radiates up the right side of her abdomen  and down into her pelvis. She spent today attempting to manage her pain with 500mg  Tylenol, cold compresses, heating pad and hot shower. These did not alleviate or reduce her pain. Her pain is aggravated by walking and repositioning. She states that when she feels the pain she has to "double over and try not to scream" until the pain subsides. She verbalizes that she felt this way when her liver enzymes were elevated and she is "very afraid her liver is sick again".  This is a recurring complaint. Patient was seen in ED 03/07 for RLQ abdominal pain. She states the location is the same but her current pain is significantly more intense than when she was seen in the ED.  Patient was prescribed Bactrim by [facility] and evaluated in MAU 03/27 for chief complaints of dysuria and low back pain. She states those problems resolved with her completion of the course of Bactrim.  She denies vaginal bleeding, urinary symptoms, abnormal vaginal discharge, fever, falls, or recent illness.    OB History    Gravida  3   Para  1   Term  1   Preterm  0   AB  1   Living  1     SAB  1   TAB  0   Ectopic  0   Multiple  0   Live Births  1           Past Medical History:  Diagnosis Date  . Abdominal pain   . ADD (attention deficit disorder)   . Anemia   . Anginal pain ([place])   . Anxiety   . Arthritis    knees  . Asthma   . 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 Cervicalgia   . Cholestasis during pregnancy   .  Chronic abdominal pain   . Complication of anesthesia    light headed  . Constipation   . Depression    not on meds  . Depression with anxiety 06/13/2017   06/13/17 rx zoloft 25mg  daily           06/06/18 w/ occ SI, rx Lexapro 10mg , [facility] for therapy  . Ear mass   . Gestational diabetes    pt states not been checking sugars regularly at home; nor has she been taking her Metformin  . Gestational diabetes   . Headache   . HSV infection   . Low iron   . PONV (postoperative nausea and vomiting)   . Suicidal intent   . UTI (lower urinary tract infection) 05/2014  . Vomiting     Past Surgical History:  Procedure Laterality Date  . CHOLECYSTECTOMY N/A 01/16/2018   Procedure: LAPAROSCOPIC CHOLECYSTECTOMY;  Surgeon: Aviva Signs, MD;  Location: [facility];  Service: General;  Laterality: N/A;  per Dr. Arnoldo Morale, pt will most likely go home and he will tell her to arrive at 10:45 - already has labs  . CHOLESTEATOMA EXCISION    .  DILATION AND EVACUATION N/A 09/11/2016   Procedure: DILATATION AND CURRATAGE  2ND TRIMESTER;  Surgeon: Jonnie Kind, MD;  Location: [place] [facility];  Service: Gynecology;  Laterality: N/A;  . IMPLANTATION BONE ANCHORED HEARING AID Right 04/2013  . INNER EAR SURGERY Right 03/31/2018  . 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 TYMPANOPLASTY Left   . TYMPANOSTOMY      Family History  Problem Relation Age of Onset  . Cholelithiasis Mother   . Kidney disease Mother        stones  . Depression Mother   . Hypertension Maternal Grandmother   . Diabetes Maternal Grandmother   . Stroke Maternal Grandmother   . Seizures Maternal Grandmother   . Asthma Maternal Grandmother   . Hyperlipidemia Maternal Grandmother   . Thyroid disease Maternal Grandmother   . Asthma Brother   . Seizures Maternal Uncle   . Cancer Other        breast- great aunt  . Celiac disease Neg Hx     Social History   Tobacco Use  . Smoking status: Former Smoker    Packs/day: 0.50     Years: 1.00    Pack years: 0.50    Types: Cigarettes    Last attempt to quit: 03/11/2017    Years since quitting: 1.4  . Smokeless tobacco: Never Used  Substance Use Topics  . Alcohol use: No    Alcohol/week: 0.0 standard drinks  . Drug use: No    Allergies:  Allergies  Allergen Reactions  . Fentanyl Nausea And Vomiting  . Keflex [Cephalexin] Other (See Comments)    Reaction:  Elevated liver enzymes   . Other Other (See Comments)    Pt is allergic to surgical glue swelling;  Cats-congestion, sneezing, eyes watery, itching all over Reaction:  Infection   . Adhesive [Tape] Rash    Medications Prior to Admission  Medication Sig Dispense Refill Last Dose  . acetaminophen (TYLENOL) 500 MG tablet Take 500 mg by mouth as needed.   Taking  . escitalopram (LEXAPRO) 10 MG tablet Take 1 tablet (10 mg total) by mouth daily. 30 tablet 6 Taking  . metroNIDAZOLE (FLAGYL) 500 MG tablet Take 1 tablet (500 mg total) by mouth 2 (two) times daily. 14 tablet 0   . prenatal vitamin w/FE, FA (PRENATAL 1 + 1) 27-1 MG TABS tablet Take 1 tablet by mouth daily at 12 noon. 30 each 12 Taking  . promethazine (PHENERGAN) 25 MG tablet Take 1 tablet (25 mg total) by mouth every 6 (six) hours as needed for nausea or vomiting. 30 tablet 1 Taking    Review of Systems  Constitutional: Negative for chills, fatigue and fever.  Respiratory: Negative for shortness of breath.   Gastrointestinal: Positive for abdominal pain. Negative for nausea and vomiting.  Genitourinary: Negative for difficulty urinating, dysuria, flank pain, vaginal bleeding, vaginal discharge and vaginal pain.  Musculoskeletal: Negative for back pain.  Neurological: Negative for headaches.  All other systems reviewed and are negative.  Physical Exam   Blood pressure 114/60, pulse 95, temperature 98.5 F (36.9 C), temperature source Oral, resp. rate 20, height 4\' 11"  (1.499 m), weight 82.3 kg, last menstrual period 03/29/2018, SpO2 97 %,  not currently breastfeeding.  Physical Exam  Nursing note and vitals reviewed. Constitutional: She is oriented to person, place, and time. She appears well-developed and well-nourished.  Cardiovascular: Normal rate.  Respiratory: Effort normal.  GI: Soft. Bowel sounds are normal. She exhibits no  distension. There is no abdominal tenderness. There is no rigidity, no rebound, no guarding, no CVA tenderness and negative Murphy's sign.  Musculoskeletal: Normal range of motion.  Neurological: She is alert and oriented to person, place, and time.  Skin: Skin is warm and dry.  Psychiatric: She has a normal mood and affect. Her behavior is normal. Judgment and thought content normal.   Patient repositioning throughout exam without difficulty.   MAU Course  Procedures  --Patient initially declined pain medicine.  --Pertinent negatives: fever, flank pain, dysuria, abdominal tenderness, hematuria --Unremarkable findings on urine culture from 03/27. UA slightly more abnormal today, will re-accomplish urine culture. --Inconsistent history during evaluation MAU. Inconsistent report of locus and onset of pain with triage RN, bedside RN and CNM.  Patient Vitals for the past 24 hrs:  BP Temp Temp src Pulse Resp SpO2 Height Weight  08/08/18 1900 (!) 110/58 - - 78 - - - -  08/08/18 1629 114/60 98.5 F (36.9 C) Oral 95 20 97 % 4\' 11"  (1.499 m) 82.3 kg    Results for orders placed or performed during the hospital encounter of 08/08/18 (from the past 24 hour(s))  Urinalysis, Routine w reflex microscopic     Status: Abnormal   Collection Time: 08/08/18  4:54 PM  Result Value Ref Range   Color, Urine YELLOW YELLOW   APPearance HAZY (A) CLEAR   Specific Gravity, Urine 1.020 1.005 - 1.030   pH 5.0 5.0 - 8.0   Glucose, UA NEGATIVE NEGATIVE mg/dL   Hgb urine dipstick NEGATIVE NEGATIVE   Bilirubin Urine NEGATIVE NEGATIVE   Ketones, ur NEGATIVE NEGATIVE mg/dL   Protein, ur NEGATIVE NEGATIVE mg/dL    Nitrite NEGATIVE NEGATIVE   Leukocytes,Ua MODERATE (A) NEGATIVE   RBC / HPF 0-5 0 - 5 RBC/hpf   WBC, UA 6-10 0 - 5 WBC/hpf   Bacteria, UA FEW (A) NONE SEEN   Squamous Epithelial / LPF 0-5 0 - 5   Mucus PRESENT   CBC with Differential/Platelet     Status: Abnormal   Collection Time: 08/08/18  5:18 PM  Result Value Ref Range   WBC 9.4 4.0 - 10.5 K/uL   RBC 4.19 3.87 - 5.11 MIL/uL   Hemoglobin 12.0 12.0 - 15.0 g/dL   HCT 35.7 (L) 36.0 - 46.0 %   MCV 85.2 80.0 - 100.0 fL   MCH 28.6 26.0 - 34.0 pg   MCHC 33.6 30.0 - 36.0 g/dL   RDW 13.9 11.5 - 15.5 %   Platelets 181 150 - 400 K/uL   nRBC 0.0 0.0 - 0.2 %   Neutrophils Relative % 72 %   Neutro Abs 6.8 1.7 - 7.7 K/uL   Lymphocytes Relative 18 %   Lymphs Abs 1.7 0.7 - 4.0 K/uL   Monocytes Relative 7 %   Monocytes Absolute 0.7 0.1 - 1.0 K/uL   Eosinophils Relative 2 %   Eosinophils Absolute 0.2 0.0 - 0.5 K/uL   Basophils Relative 0 %   Basophils Absolute 0.0 0.0 - 0.1 K/uL   Immature Granulocytes 1 %   Abs Immature Granulocytes 0.05 0.00 - 0.07 K/uL  Comprehensive metabolic panel     Status: Abnormal   Collection Time: 08/08/18  5:18 PM  Result Value Ref Range   Sodium 136 135 - 145 mmol/L   Potassium 3.9 3.5 - 5.1 mmol/L   Chloride 106 98 - 111 mmol/L   CO2 23 22 - 32 mmol/L   Glucose, Bld 103 (H) 70 - 99  mg/dL   BUN 7 6 - 20 mg/dL   Creatinine, Ser 0.51 0.44 - 1.00 mg/dL   Calcium 9.1 8.9 - 10.3 mg/dL   Total Protein 6.7 6.5 - 8.1 g/dL   Albumin 3.0 (L) 3.5 - 5.0 g/dL   AST 13 (L) 15 - 41 U/L   ALT 11 0 - 44 U/L   Alkaline Phosphatase 94 38 - 126 U/L   Total Bilirubin 0.3 0.3 - 1.2 mg/dL   GFR calc non Af Amer >60 >60 mL/min   GFR calc Af Amer >60 >60 mL/min   Anion gap 7 5 - 15  Lipase, blood     Status: None   Collection Time: 08/08/18  5:18 PM  Result Value Ref Range   Lipase 30 11 - 51 U/L    US Abdomen Limited  Result Date: 08/08/2018 CLINICAL DATA:  Right lower quadrant pain EXAM: ULTRASOUND ABDOMEN LIMITED  RIGHT UPPER QUADRANT COMPARISON:  None. FINDINGS: Gallbladder: Status post cholecystectomy. Common bile duct: Diameter: 2 mm Liver: No focal lesion identified. Within normal limits in parenchymal echogenicity. Portal vein is patent on color Doppler imaging with normal direction of blood flow towards the liver. IMPRESSION: Normal right upper quadrant ultrasound status post cholecystectomy. Electronically Signed   By: Ulyses Jarred M.D.   On: 08/08/2018 18:46    Meds ordered this encounter  Medications  . acetaminophen (TYLENOL) tablet 1,000 mg  . cyclobenzaprine (FLEXERIL) 10 MG tablet    Sig: Take 1 tablet (10 mg total) by mouth 2 (two) times daily as needed for muscle spasms.    Dispense:  20 tablet    Refill:  0    Order Specific Question:   Supervising Provider    Answer:   Donnamae Jude [[id number]]    Assessment and Plan  --23 y.o. G3P1011 at [redacted]w[redacted]d  --[place] 161 by Doppler --No concerning findings on physical exam, labs or imaging today --Likely musculoskeletal pain. Continue Tylenol PRN, short course Flexeril to pharmacy --Urine culture pending --Discharge home in stable condition  F/U: Next OB appt [facility] 08/15/2018  Darlina Rumpf, CNM 08/08/2018, 7:13 PM

## 2018-08-08 NOTE — Telephone Encounter (Signed)
Patient states she has been having pain in her upper right quadrant that seems to be getting worse.  She is "doubling over" at times and is unable to eat.  States it feels like what it felt like in the past when she had elevated liver enzymes.  Advised patient that since the pain is getting worse, to go to [facility] for eval.  Pt verbalized understanding.

## 2018-08-09 LAB — CULTURE, OB URINE

## 2018-08-14 ENCOUNTER — Telehealth: Payer: Self-pay | Admitting: *Deleted

## 2018-08-14 NOTE — Telephone Encounter (Signed)
Unable to screen pt. Voicemail not set up

## 2018-08-15 ENCOUNTER — Encounter: Payer: Medicaid Other | Admitting: Obstetrics & Gynecology

## 2018-08-15 ENCOUNTER — Other Ambulatory Visit: Payer: Self-pay

## 2018-08-15 ENCOUNTER — Ambulatory Visit (INDEPENDENT_AMBULATORY_CARE_PROVIDER_SITE_OTHER): Payer: Medicaid Other

## 2018-08-15 DIAGNOSIS — Z3A18 18 weeks gestation of pregnancy: Secondary | ICD-10-CM | POA: Diagnosis not present

## 2018-08-15 DIAGNOSIS — Z3402 Encounter for supervision of normal first pregnancy, second trimester: Secondary | ICD-10-CM

## 2018-08-15 DIAGNOSIS — Z363 Encounter for antenatal screening for malformations: Secondary | ICD-10-CM | POA: Diagnosis not present

## 2018-08-15 NOTE — Progress Notes (Signed)
[name] 39+4 wks,cephalic,cx 3.1 cm,anterior placenta gr 0,normal ovaries bilat,svp 3.5 cm,normal right ovary,simple left ovarian cyst 5.9 x 5.7 x 4.1 cm,LVEICF 2.5 mm,fhr 154 bpm,efw 269 g 70%,anatomy complete

## 2018-08-16 ENCOUNTER — Telehealth: Payer: Self-pay | Admitting: *Deleted

## 2018-08-16 NOTE — Telephone Encounter (Signed)
Patient called stating she had an emergency yesterday and had to leave but did get the US done. Patient states she needs to be seen sooner than 5/5 because she is having pelvic pain. Please advise

## 2018-08-17 ENCOUNTER — Ambulatory Visit (INDEPENDENT_AMBULATORY_CARE_PROVIDER_SITE_OTHER): Payer: Medicaid Other | Admitting: Advanced Practice Midwife

## 2018-08-17 ENCOUNTER — Encounter: Payer: Self-pay | Admitting: Advanced Practice Midwife

## 2018-08-17 ENCOUNTER — Other Ambulatory Visit: Payer: Self-pay | Admitting: Advanced Practice Midwife

## 2018-08-17 ENCOUNTER — Telehealth: Payer: Self-pay | Admitting: *Deleted

## 2018-08-17 ENCOUNTER — Other Ambulatory Visit: Payer: Self-pay

## 2018-08-17 VITALS — Wt 183.0 lb

## 2018-08-17 DIAGNOSIS — O26899 Other specified pregnancy related conditions, unspecified trimester: Secondary | ICD-10-CM

## 2018-08-17 DIAGNOSIS — R109 Unspecified abdominal pain: Secondary | ICD-10-CM

## 2018-08-17 DIAGNOSIS — R102 Pelvic and perineal pain: Principal | ICD-10-CM

## 2018-08-17 DIAGNOSIS — Z3A18 18 weeks gestation of pregnancy: Secondary | ICD-10-CM

## 2018-08-17 DIAGNOSIS — Z3482 Encounter for supervision of other normal pregnancy, second trimester: Secondary | ICD-10-CM

## 2018-08-17 DIAGNOSIS — Z3402 Encounter for supervision of normal first pregnancy, second trimester: Secondary | ICD-10-CM

## 2018-08-17 DIAGNOSIS — L299 Pruritus, unspecified: Secondary | ICD-10-CM

## 2018-08-17 DIAGNOSIS — G8929 Other chronic pain: Secondary | ICD-10-CM

## 2018-08-17 MED ORDER — BLOOD PRESSURE MONITOR AUTOMAT DEVI: 0 refills | Status: DC

## 2018-08-17 NOTE — Progress Notes (Unsigned)
   TELEHEALTH VIRTUAL OBSTETRICS VISIT ENCOUNTER NOTE  I connected with Stacey Cook on 08/17/18 at  by telephone at home and verified that I am speaking with the correct person using two identifiers.   I discussed the limitations, risks, security and privacy concerns of performing an evaluation and management service by telephone and the availability of in person appointments. I also discussed with the patient that there may be a patient responsible charge related to this service. The patient expressed understanding and agreed to proceed.  Subjective:  Stacey Cook is a 23 y.o. G3P1011 at [redacted]w[redacted]d being followed for ongoing prenatal care.  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; Asthma, mild intermittent; Binge-eating disorder, in partial remission, moderate; Severe episode of recurrent major depressive disorder, without psychotic features ([place]); Cholesteatoma of attic of right ear; Perforation of left tympanic membrane; Vasovagal syncope; Conductive hearing loss, unilat, unrestrict hearing contralateral side; History of miscarriage; Depression with anxiety and suicidal ideations; Chest pain due to GERD; History of gestational diabetes; History of cholestasis during pregnancy; Postpartum anemia; Acute blood loss anemia; History of shoulder dystocia in prior pregnancy; History of postpartum hemorrhage; Acute gallstone pancreatitis; Pancreatitis; 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  Patient reports {[id number]}. Reports fetal movement. Denies any contractions, bleeding or leaking of fluid.   The following portions of the patient's history were reviewed and updated as appropriate: allergies, current medications, past family history, past medical history, past social history, past surgical history and problem list.   Objective:   General:  Alert, oriented and cooperative.   Mental Status: Normal mood and affect perceived. Normal judgment and thought content.  Rest of physical exam deferred due to type of encounter   [name] 82+5 wks,cephalic,cx 3.1 cm,anterior placenta gr 0,normal ovaries bilat,svp 3.5 cm,normal right ovary,simple left ovarian cyst 5.9 x 5.7 x 4.1 cm,LVEICF 2.5 mm,fhr 154 bpm,efw 269 g 70%,anatomy complete   Assessment and Plan:  Pregnancy: G3P1011 at [redacted]w[redacted]d There are no diagnoses linked to this encounter. {Blank single:19197::"Term","Preterm"} labor symptoms and general obstetric precautions including but not limited to vaginal bleeding, contractions, leaking of fluid and fetal movement were reviewed in detail with the patient.  I discussed the assessment and treatment plan with the patient. The patient was provided an opportunity to ask questions and all were answered. The patient agreed with the plan and demonstrated an understanding of the instructions. The patient was advised to call back or seek an in-person office evaluation/go to [facility] at [facility] for any urgent or concerning symptoms. Please refer to After Visit Summary for other counseling recommendations.   I provided *** minutes of non-face-to-face time during this encounter.  No follow-ups on file.  Future Appointments  Date Time Provider [place]  09/12/2018  3:45 PM Roma Schanz, CNM [facility]    Christin Fudge, [place] for  [employer], [place]

## 2018-08-17 NOTE — Progress Notes (Signed)
   TELEHEALTH VIRTUAL OBSTETRICS VISIT ENCOUNTER NOTE  I connected with Stacey Cook on 08/17/18 at  3:30 PM EDT by telephone at home and verified that I am speaking with the correct person using two identifiers.   I discussed the limitations, risks, security and privacy concerns of performing an evaluation and management service by telephone and the availability of in person appointments. I also discussed with the patient that there may be a patient responsible charge related to this service. The patient expressed understanding and agreed to proceed.  Subjective:  Stacey Cook is a 23 y.o. G3P1011 at [redacted]w[redacted]d being followed for ongoing prenatal care.  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; Asthma, mild intermittent; Binge-eating disorder, in partial remission, moderate; Severe episode of recurrent major depressive disorder, without psychotic features ([place]); Cholesteatoma of attic of right ear; Perforation of left tympanic membrane; Vasovagal syncope; Conductive hearing loss, unilat, unrestrict hearing contralateral side; History of miscarriage; Depression with anxiety and suicidal ideations; Chest pain due to GERD; History of gestational diabetes; History of cholestasis during pregnancy; Postpartum anemia; Acute blood loss anemia; History of shoulder dystocia in prior pregnancy; History of postpartum hemorrhage; Acute gallstone pancreatitis; Pancreatitis; 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  Patient reports lower pelvic/back/hip pain, deemed to be MSK at MAU visit--they recommended PT. Pt wants to pursue.  Also c/o stomach itching, had cholestasis last pg. Reports fetal movement. Denies any contractions, bleeding or leaking of fluid.   The following portions of the patient's history were reviewed and updated as appropriate: allergies, current medications, past family history, past medical history, past social history, past surgical history and problem list.   Objective:   General:  Alert, oriented and cooperative.   Mental Status: Normal mood and affect perceived. Normal judgment and thought content.  Rest of physical exam deferred due to type of encounter  Assessment and Plan:  Pregnancy: G3P1011 at [redacted]w[redacted]d 1. Encounter for supervision of normal first pregnancy in second trimester MSK pain--PT referral sent [place]--bile acids ordered, to get when fasting BP rx sent to [place] sent to pt.   obstetric precautions including but not limited to vaginal bleeding, contractions, leaking of fluid and fetal movement were reviewed in detail with the patient.  I discussed the assessment and treatment plan with the patient. The patient was provided an opportunity to ask questions and all were answered. The patient agreed with the plan and demonstrated an understanding of the instructions. The patient was advised to call back or seek an in-person office evaluation/go to [facility] at [facility] for any urgent or concerning symptoms. Please refer to After Visit Summary for other counseling recommendations.   I provided 16 minutes of non-face-to-face time during this encounter.  No follow-ups on file.  Future Appointments  Date Time Provider [place]  09/12/2018  3:45 PM Roma Schanz, CNM [facility]    Christin Fudge, [place] for  [employer], [place]

## 2018-08-17 NOTE — Telephone Encounter (Signed)
Patient states she had to leave after her u/s 08-06-2022 as she had a death in her family.  Says she is having pelvic discomfort and wants to discuss having physical therapy.  Will schedule for televisit as she missed her appt with provider.  Verbalized understanding.

## 2018-08-18 ENCOUNTER — Telehealth (HOSPITAL_COMMUNITY): Payer: Self-pay

## 2018-08-18 NOTE — Telephone Encounter (Signed)
Stacey Cook was contacted today regarding temporary closure of Outpatient Rehabilitation Services at [facility] due to concerns for community transmission of COVID-19.  Patient identity was verified. patient did have an acute/special need that requires in person visit.  Will direct call to front office to schedule appointment with therapist.  Patient is presenting for new evaluation for low back, pelvic, and hip pain related to her current pregnancy.   The patient expressed interest in being contacted for an E-Visit, virtual check in, or Telehealth visit to continue their plan of care following evaluation when those services become available. She has internet access at home and has a smart phone that could be used for telehealth visits.   She has been scheduled for her initial evaluation on 08/21/18 at 9:45 AM.  Patient is aware we can be reached by telephone during limited business hours in the meantime.   Kipp Brood, PT, DPT, [facility] Physical Therapist with [facility]  08/18/2018 1:41 PM

## 2018-08-21 ENCOUNTER — Telehealth: Payer: Self-pay | Admitting: Advanced Practice Midwife

## 2018-08-21 ENCOUNTER — Encounter (HOSPITAL_COMMUNITY): Payer: Self-pay

## 2018-08-21 ENCOUNTER — Ambulatory Visit (HOSPITAL_COMMUNITY): Payer: Medicaid Other | Attending: Advanced Practice Midwife

## 2018-08-21 ENCOUNTER — Other Ambulatory Visit: Payer: Medicaid Other

## 2018-08-21 ENCOUNTER — Other Ambulatory Visit: Payer: Self-pay

## 2018-08-21 DIAGNOSIS — M545 Low back pain, unspecified: Secondary | ICD-10-CM

## 2018-08-21 DIAGNOSIS — M25551 Pain in right hip: Secondary | ICD-10-CM | POA: Diagnosis present

## 2018-08-21 DIAGNOSIS — O99712 Diseases of the skin and subcutaneous tissue complicating pregnancy, second trimester: Principal | ICD-10-CM

## 2018-08-21 DIAGNOSIS — L299 Pruritus, unspecified: Secondary | ICD-10-CM

## 2018-08-21 DIAGNOSIS — R293 Abnormal posture: Secondary | ICD-10-CM | POA: Diagnosis present

## 2018-08-21 NOTE — Telephone Encounter (Signed)
Patient is requesting an order for a blood pressure cuff, she stated that Manus Gunning had told her she would send it in.    [employer]  [phone]

## 2018-08-21 NOTE — Therapy (Signed)
[place], [place], [address] Phone: [phone]   Fax:  [phone]  Physical Therapy Evaluation  Patient Details  Name: Stacey Cook MRN: [id number] Date of Birth: 10-Jan-1996 Referring Provider (PT): Christin Fudge, [place]   Encounter Date: 08/21/2018  PT End of Session - 08/21/18 1411    Visit Number  1    Number of Visits  12    Date for PT Re-Evaluation  09/11/18    Authorization Type  Medicaid [place] (requested 12 units on 08/21/18)    Authorization Time Period  08/21/18-10/02/18    Authorization - Visit Number  0    Authorization - Number of Visits  12    PT Start Time  0950    PT Stop Time  1035    PT Time Calculation (min)  45 min    Activity Tolerance  Patient tolerated treatment well    Behavior During Therapy  [facility] for tasks assessed/performed       Past Medical History:  Diagnosis Date  . Abdominal pain   . ADD (attention deficit disorder)   . Anemia   . Anginal pain ([place])   . Anxiety   . Arthritis    knees  . Asthma   . 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 Cervicalgia   . Cholestasis during pregnancy   . Chronic abdominal pain   . Complication of anesthesia    light headed  . Constipation   . Depression    not on meds  . Depression with anxiety 06/13/2017   06/13/17 rx zoloft 25mg  daily           06/06/18 w/ occ SI, rx Lexapro 10mg , [facility] for therapy  . Ear mass   . Gestational diabetes    pt states not been checking sugars regularly at home; nor has she been taking her Metformin  . Gestational diabetes   . Headache   . HSV infection   . Low iron   . PONV (postoperative nausea and vomiting)   . Suicidal intent   . UTI (lower urinary tract infection) 05/2014  . Vomiting     Past Surgical History:  Procedure Laterality Date  . CHOLECYSTECTOMY N/A 01/16/2018   Procedure: LAPAROSCOPIC CHOLECYSTECTOMY;  Surgeon: Aviva Signs, MD;  Location: [facility];  Service: General;  Laterality: N/A;  per Dr. Arnoldo Morale, pt will most likely go home and he will tell her to arrive at 10:45 - already has labs  . CHOLESTEATOMA EXCISION    . DILATION AND EVACUATION N/A 09/11/2016   Procedure: DILATATION AND CURRATAGE  2ND TRIMESTER;  Surgeon: Jonnie Kind, MD;  Location: [facility];  Service: Gynecology;  Laterality: N/A;  . IMPLANTATION BONE ANCHORED HEARING AID Right 04/2013  . INNER EAR SURGERY Right 03/31/2018  . 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 TYMPANOPLASTY Left   . TYMPANOSTOMY      There were no vitals filed for this visit.   Subjective Assessment - 08/21/18 0957    Subjective  Patient reports she is [redacted] weeks pregnant with her second child. She reports she had fall ~3 weeks ago when she slipped in the bathroom and landed first with he back against the tub and a second time with her Rt hip/side against the toilet. She reports she had bruises from this fall but that they have gone away for the most part. She states since then she has  had pain along her Rt hip and occasionally in her lower back. She did speak with her OBG and they recommended physical therapy. She reports her Rt hip (anteriorly) and back are aggravated by bending forward, squatting, and lifting her 55lb 57-month old son up. She wants to be able to care for her son without pain.    Pertinent History  [redacted] weeks pregnant on 08/21/18; 1st birth ~9 months ago    Limitations  Sitting;Standing;Lifting;House hold activities    How long can you sit comfortably?  a couple minutes    How long can you stand comfortably?  several minutes    How long can you walk comfortably?  several minutes    Patient Stated Goals  to pick her son up without pain    Currently in Pain?  Yes    Pain Score  5     Pain Location  Hip    Pain Orientation  Right;Anterior;Lateral    Pain Descriptors / Indicators  Sharp;Stabbing    Pain Type  Acute pain   subacute   Pain Radiating Towards   radiates to lower back on Rt sometimes    Pain Onset  1 to 4 weeks ago    Pain Frequency  Constant    Aggravating Factors   bending forward, squatting, lifting her son, laundry    Pain Relieving Factors  laying down    Effect of Pain on Daily Activities  moderate    Multiple Pain Sites  No         [facility] PT Assessment - 08/21/18 0001      Assessment   Medical Diagnosis  Right Hip and Low Back Pain    Referring Provider (PT)  Christin Fudge, CNM    Onset Date/Surgical Date  07/31/18   approximately   Next MD Visit  09/12/2018    Prior Therapy  Yes, for cervicalgia      Precautions   Precautions  None      Restrictions   Weight Bearing Restrictions  No      Balance Screen   Has the patient fallen in the past 6 months  Yes      [place] residence    Living Arrangements  Children;Parent;Spouse/significant other;Other relatives   Mom, [place], Husband, Son, and little brother   Available Help at Discharge  Family    Additional Comments  Patient typically helps with caring for the children and occasionally helps wiht laundrey and cooking/cleaning. She does not do a lot currently as her grandmother doesn't want her to since she is pregnant. She cannot help with laundry as she has pain when bending forward.       Prior Function   Level of Independence  Independent    Vocation  Unemployed   Pt is a stay at home mom   Leisure  Patient enjoys caring for her 93 month old son.      Cognition   Overall Cognitive Status  Within Functional Limits for tasks assessed      Observation/Other Assessments   Observations  Patient is 19 weeks pregrnant with her second child.    Skin Integrity  no bruising noted along Rt hip where patient reports pain    Focus on Therapeutic Outcomes (FOTO)   NA      Posture/Postural Control   Posture/Postural Control  Postural limitations    Postural Limitations  Rounded Shoulders;Forward  head;Increased lumbar lordosis;Anterior pelvic tilt  ROM / Strength   AROM / PROM / Strength  AROM;Strength      AROM   Overall AROM Comments  Repeated extension (10 reps, 10 seconds) resulted in improved fleixon ROM for patient up to 38 degrees (still had pain at end range)    AROM Assessment Site  Lumbar    Lumbar Flexion  30   Pain   Lumbar Extension  20    Lumbar - Right Side Bend  30    Lumbar - Left Side Bend  15   Pain on Rt side     Strength   Strength Assessment Site  Hip    Right Hip Flexion  4/5   Painful   Right Hip Extension  4+/5    Right Hip ABduction  4/5   Painful   Right Hip ADduction  4+/5    Left Hip Flexion  5/5    Left Hip Extension  5/5    Left Hip ABduction  5/5    Left Hip ADduction  5/5      Special Tests    Special Tests  Lumbar;Hip Special Tests;Sacrolliac Tests    Sacroiliac Tests   Sacral Compression    Hip Special Tests   Saralyn Pilar (FABER) Test;SI Compression;SI Distraction;Hip Scouring;Other      Pelvic Dictraction   Findings  Negative    Side   Right    Comment  pain in anterior Rt hip      Pelvic Compression   Side  Right    comment  pain in anterior Rt hip      Sacral thrust    Findings  Negative    Side  Right    Comments  pain in anterior Rt hip      Gaenslen's test   Findings  Negative    Side   Right    Comments  pain in anterior Rt hip      Sacral Compression   Findings  Negative    Side   Right    Comments  pain in anterior Rt hip      Saralyn Pilar (FABER) Test   Findings  Negative      SI Compression   Findings  Negative    Side  Right    Comments  pain in anterior Rt hip      SI Distraction   Findings   Negative    Side  Right    Comments  pain in anterior Rt hip      Hip Scouring   Findings  Positive    Side  Right    Comments  pt complained of pain with end range flexion      other   Findings  Positive    Side  Right    Comments  FADDIR   pain ant Rt hip at inguinal ligament at attachment at ASIS        Objective measurements completed on examination: See above findings.     [facility] PT Treatment/Exercise - 08/21/18 0001      Exercises   Exercises  Lumbar      Lumbar Exercises: Stretches   Standing Extension  10 reps;10 seconds    Standing Extension Limitations  educated to do at counter    Figure 4 Stretch  Supine;3 reps;30 seconds;With overpressure      Lumbar Exercises: Supine   Pelvic Tilt  10 reps;5 seconds    Pelvic Tilt Limitations  post pelvic tilt    Bent Knee Raise  10 reps;Limitations    Bent Knee Raise Limitations  post pelvic tilt        PT Education - 08/21/18 1524    Education Details  Educated on exam findings and on plan to request for visits with medicaid. Plan to initiate telehealth therapy and weekly calls to follow up on HEP.    Person(s) Educated  Patient    Methods  Handout;Explanation;Demonstration    Comprehension  Verbalized understanding;Returned demonstration       PT Short Term Goals - 08/21/18 1535      PT SHORT TERM GOAL #1   Title  Patient will be independent with HEP, updated PRN, and demonstrate proper form with exercises.    Time  2    Period  Weeks    Status  New    Target Date  09/04/18      PT SHORT TERM GOAL #2   Title  Patient will be able to perform 10 squats to touch chair with no pain in order to improve squat mechanics to perform activities like laundry and caring for her son.     Time  3    Period  Weeks    Status  New    Target Date  09/11/18      PT SHORT TERM GOAL #3   Title  Patient will have no pain with lumbar flexion and achieve full AROM to demonstrate improved tolerance to aggravating postures.    Time  3    Period  Weeks    Status  New        PT Long Term Goals - 08/21/18 1538      PT LONG TERM GOAL #1   Title  Patient will demonstrate proper lifting mechanics to lift 10lbs off the floor to chest height to dmeonstrate increased tolerance to loading lumbar spine to care for her son.     Time   6    Period  Weeks    Status  New    Target Date  10/02/18      PT LONG TERM GOAL #2   Title  Patient will hav eno pain with MMT for Rt hip and achieve 5/5 strength throughout to equal Lt LE and indicate improve tolerance to loading her soft tissue.    Time  6    Period  Weeks    Status  New      PT LONG TERM GOAL #3   Title  Patient will report no pain increase in her Rt hip for 1 week while being able to pick up her son and carry him to care for his daily needs.     Time  6    Period  Weeks    Status  New         Plan - 08/21/18 1417    Clinical Impression Statement  Ms. [name] presents to physical therapy for evaluation of Rt hip/pelvis and lower back pain. She is currently 19 weeks into her second pregnancy and reports she fell ~ 3 weeks ago and landed on her Rt side and back. Her primary pain is located along the anterior Rt hip at the inguinal ligament attachment at the ASIS. Her pain occasionally wraps around her Rt side into her low back but she does not report pinpoint pain along PSIS. She is negative with [place] SIJ testing as all tests result in ant Rt hip pain. She doe have pain with Rt hip flexion and abduction resistance testing and with lumbar  flexion and Lt side bend. She reported some improved pain with repeated lumbar extension and demonstrated improve flexion ROM  following the exercise. Ms. [name] is presenting with subacute pain along anterior Rt hip and possible lumbar spine derangement. She will benefit from skilled PT interventions to address impairments and progress towards her goal of being able to care for her 81 month old son without pain.    Personal Factors and Comorbidities  Fitness;Social Background    Examination-Activity Limitations  Caring for Others;Bend;Carry;Lift;Sit;Squat    Examination-Participation Restrictions  Cleaning;Laundry;Community Activity    Stability/Clinical Decision Making  Stable/Uncomplicated    Clinical Decision Making  Low    Rehab  Potential  Fair    PT Frequency  2x / week    PT Duration  6 weeks    PT Treatment/Interventions  ADLs/Self Care Home Management;Cryotherapy;Electrical Stimulation;Moist Heat;Therapeutic activities;Therapeutic exercise;Functional mobility training;Balance training;Neuromuscular re-education;Patient/family education;Manual techniques;Passive range of motion;Taping    PT Next Visit Plan  Review goals with patient and HEP. Assess posture for any lateral shift and insruct on self correction is present. Perform stabilizaiton exercises and stretching for hips. PRogress through squat and forward flexion motion with hip hinge focus to progress towards deadlift for strengthening.    PT Home Exercise Plan  Eval: post pelvic tilt, post pelvic tilt with marching, figure four stretch, back extension at counter (repeated)    Consulted and Agree with Plan of Care  Patient       Patient will benefit from skilled therapeutic intervention in order to improve the following deficits and impairments:  Decreased activity tolerance, Decreased range of motion, Decreased strength, Decreased balance, Decreased mobility, Pain, Impaired perceived functional ability, Postural dysfunction, Obesity  Visit Diagnosis: Pain in right hip  Right-sided low back pain without sciatica, unspecified chronicity  Abnormal posture     Problem List Patient Active Problem List   Diagnosis Date Noted  . Ovarian cyst 07/04/2018  . Supervision of normal pregnancy 06/06/2018  . Short interval between pregnancies affecting pregnancy in first trimester, antepartum 05/19/2018  . 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 Gastroesophageal reflux disease   . Acute gallstone pancreatitis 01/12/2018  . Pancreatitis 01/12/2018  . History of shoulder dystocia in prior pregnancy 12/02/2017  . History of postpartum hemorrhage 12/02/2017  . Acute blood loss anemia 10/30/2017   . Postpartum anemia 10/29/2017  . History of cholestasis during pregnancy 09/21/2017  . History of gestational diabetes 08/24/2017  . Chest pain due to GERD 08/05/2017  . Depression with anxiety and suicidal ideations 06/13/2017  . History of miscarriage 01/18/2017  . Conductive hearing loss, unilat, unrestrict hearing contralateral side 12/10/2015  . Severe episode of recurrent major depressive disorder, without psychotic features ([place])   . 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 Asthma, mild intermittent 02/18/2015  . 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 Perforation of left tympanic membrane 06/19/2014  . Cervicalgia 12/20/2013  . Abdominal pain, chronic, epigastric 08/08/2013  . Acne 08/22/2012  . GAD (generalized anxiety disorder) 08/01/2012  . ADHD (attention deficit hyperactivity disorder), combined type 08/01/2012  . ODD (oppositional defiant disorder) 08/01/2012  . Vasovagal syncope 02/28/2012  . Cholesteatoma of attic of right ear 02/15/2011    Kipp Brood, PT, DPT, [facility] Physical Therapist with [facility]  08/21/2018 3:41 PM  [place], [place], [address] Phone: [phone]   Fax:  [phone]  Name: Stacey Cook MRN: [id number] Date of Birth: [birth year]

## 2018-08-22 ENCOUNTER — Encounter: Payer: Self-pay | Admitting: *Deleted

## 2018-08-22 ENCOUNTER — Other Ambulatory Visit: Payer: Self-pay | Admitting: Women's Health

## 2018-08-23 LAB — BILE ACIDS, TOTAL: Bile Acids Total: 2.8 umol/L (ref 0.0–10.0)

## 2018-08-24 ENCOUNTER — Telehealth: Payer: Self-pay | Admitting: Women's Health

## 2018-08-24 NOTE — Telephone Encounter (Signed)
Called patient back and discussed bile acid results. She states that she still is itching. Advised to use lotion and hydrocortisone cream if needed. If it continues to bother her she can call us back before her visit.

## 2018-08-24 NOTE — Telephone Encounter (Signed)
Patient called, stated she sees her labs in [place], but wants to discuss with someone.  [phone]

## 2018-08-28 ENCOUNTER — Telehealth: Payer: Self-pay | Admitting: Advanced Practice Midwife

## 2018-08-28 ENCOUNTER — Encounter (HOSPITAL_COMMUNITY): Payer: Self-pay

## 2018-08-28 ENCOUNTER — Ambulatory Visit (HOSPITAL_COMMUNITY): Payer: Medicaid Other

## 2018-08-28 ENCOUNTER — Other Ambulatory Visit: Payer: Self-pay

## 2018-08-28 DIAGNOSIS — M545 Low back pain, unspecified: Secondary | ICD-10-CM

## 2018-08-28 DIAGNOSIS — R293 Abnormal posture: Secondary | ICD-10-CM

## 2018-08-28 DIAGNOSIS — M25551 Pain in right hip: Secondary | ICD-10-CM | POA: Diagnosis not present

## 2018-08-28 NOTE — Therapy (Signed)
[place], [place], [address] Phone: [phone]   Fax:  [phone]  Physical Therapy Treatment  Patient Details  Name: Stacey Cook MRN: [id number] Date of Birth: 05-08-1996 Referring Provider (PT): Christin Fudge, [place]   Encounter Date: 08/28/2018  PT End of Session - 08/28/18 1458    Visit Number  2    Number of Visits  12    Date for PT Re-Evaluation  09/11/18    Authorization Type  Medicaid [place] (requested 12 units on 08/21/18)    Authorization Time Period  08/21/18-10/02/18    Authorization - Visit Number  0    Authorization - Number of Visits  12    PT Start Time  1450    PT Stop Time  1533    PT Time Calculation (min)  43 min    Activity Tolerance  Patient tolerated treatment well    Behavior During Therapy  [facility] for tasks assessed/performed       Past Medical History:  Diagnosis Date  . Abdominal pain   . ADD (attention deficit disorder)   . Anemia   . Anginal pain ([place])   . Anxiety   . Arthritis    knees  . Asthma   . 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 Cervicalgia   . Cholestasis during pregnancy   . Chronic abdominal pain   . Complication of anesthesia    light headed  . Constipation   . Depression    not on meds  . Depression with anxiety 06/13/2017   06/13/17 rx zoloft 25mg  daily           06/06/18 w/ occ SI, rx Lexapro 10mg , [facility] for therapy  . Ear mass   . Gestational diabetes    pt states not been checking sugars regularly at home; nor has she been taking her Metformin  . Gestational diabetes   . Headache   . HSV infection   . Low iron   . PONV (postoperative nausea and vomiting)   . Suicidal intent   . UTI (lower urinary tract infection) 05/2014  . Vomiting     Past Surgical History:  Procedure Laterality Date  . CHOLECYSTECTOMY N/A 01/16/2018   Procedure: LAPAROSCOPIC CHOLECYSTECTOMY;  Surgeon: Aviva Signs, MD;  Location: [facility];  Service: General;  Laterality: N/A;  per Dr. Arnoldo Morale, pt will most likely go home and he will tell her to arrive at 10:45 - already has labs  . CHOLESTEATOMA EXCISION    . DILATION AND EVACUATION N/A 09/11/2016   Procedure: DILATATION AND CURRATAGE  2ND TRIMESTER;  Surgeon: Jonnie Kind, MD;  Location: [facility];  Service: Gynecology;  Laterality: N/A;  . IMPLANTATION BONE ANCHORED HEARING AID Right 04/2013  . INNER EAR SURGERY Right 03/31/2018  . 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 TYMPANOPLASTY Left   . TYMPANOSTOMY      There were no vitals filed for this visit.  Subjective Assessment - 08/28/18 1451    Subjective  Patient reports she has been doing her HEP daily, typically 2x/day and that she is primarily performing her back extension at the counter and posterior pelvic tilts. She reports her pain seems to be improving slightly so her husband and her attempted to become intimate. She was unable to find a comfortable position for her Rt hip, pelvis, and lower back pain. She reports she is not able to participate  in the telehealth sessions due to limited space and a busy household.    pt is interetested in telehealth to continue services   Pertinent History  [redacted] weeks pregnant on 08/21/18; 1st birth ~9 months ago    Limitations  Sitting;Standing;Lifting;House hold activities    How long can you sit comfortably?  a couple minutes    How long can you stand comfortably?  several minutes    How long can you walk comfortably?  several minutes    Patient Stated Goals  to pick her son up without pain    Currently in Pain?  Yes    Pain Score  5     Pain Location  Hip    Pain Orientation  Right;Anterior    Pain Descriptors / Indicators  Sharp    Pain Type  Acute pain   subacute   Pain Radiating Towards  righ tgroin/hip/pelvis and lower back    Pain Onset  1 to 4 weeks ago    Pain Frequency  Constant    Aggravating Factors   beding forward, squatting, lifting    Pain  Relieving Factors  laying down    Effect of Pain on Daily Activities  moderate       [facility] PT Assessment - 08/28/18 0001      Posture/Postural Control   Posture Comments  SIJ motion testing in standing (Gillet Test): no abnormalities bilaterally       [facility] Adult PT Treatment/Exercise - 08/28/18 0001      Exercises   Exercises  Lumbar      Lumbar Exercises: Stretches   Other Lumbar Stretch Exercise  Supine hip flexor stretch for Rt side: 10x 5 second holds      Lumbar Exercises: Supine   Pelvic Tilt  10 reps;5 seconds    Pelvic Tilt Limitations  post pelvic tilt    Other Supine Lumbar Exercises  Hip abduction isometric: 2x 10 reps, 8 sec holds    Other Supine Lumbar Exercises  Hip adduction isometric: 2x 10 reps, 8 sec holds      Lumbar Exercises: Quadruped   Madcat/Old Horse  5 reps;Limitations    Madcat/Old Horse Limitations  discontinued as "cat" with post pelvic tilt increased pt's hip pain significantly      Manual Therapy   Manual Therapy  Joint mobilization    Manual therapy comments  performed seperate from other interventions    Joint Mobilization  Rt hip long axis distraction with grade III oscillations; 6 minutes   2 minute son Lt hip first to demonstrate technique      PT Education - 08/28/18 1542    Education Details  Educated on updated exercises for HEP. Educated on purpose of manual therapy. Discussed options of purchasing a maternity belt for support and plan to ask MD if insurance covers this and they could write a prescription. Encouraged patient to search maternity belts and read about different types.     Person(s) Educated  Patient    Methods  Explanation;Handout    Comprehension  Verbalized understanding;Returned demonstration       PT Short Term Goals - 08/21/18 1535      PT SHORT TERM GOAL #1   Title  Patient will be independent with HEP, updated PRN, and demonstrate proper form with exercises.    Time  2    Period  Weeks    Status  New     Target Date  09/04/18      PT SHORT TERM  GOAL #2   Title  Patient will be able to perform 10 squats to touch chair with no pain in order to improve squat mechanics to perform activities like laundry and caring for her son.     Time  3    Period  Weeks    Status  New    Target Date  09/11/18      PT SHORT TERM GOAL #3   Title  Patient will have no pain with lumbar flexion and achieve full AROM to demonstrate improved tolerance to aggravating postures.    Time  3    Period  Weeks    Status  New        PT Long Term Goals - 08/21/18 1538      PT LONG TERM GOAL #1   Title  Patient will demonstrate proper lifting mechanics to lift 10lbs off the floor to chest height to dmeonstrate increased tolerance to loading lumbar spine to care for her son.     Time  6    Period  Weeks    Status  New    Target Date  10/02/18      PT LONG TERM GOAL #2   Title  Patient will hav eno pain with MMT for Rt hip and achieve 5/5 strength throughout to equal Lt LE and indicate improve tolerance to loading her soft tissue.    Time  6    Period  Weeks    Status  New      PT LONG TERM GOAL #3   Title  Patient will report no pain increase in her Rt hip for 1 week while being able to pick up her son and carry him to care for his daily needs.     Time  6    Period  Weeks    Status  New       Plan - 08/28/18 1500    Clinical Impression Statement  Assessed SIJ with Gillet test today and no abnormal motions present, will continue to assess. Patient reported pain with Rt hip flexion/marching in standing. She was educated on Rt hip flexor stretch which is also the position to stretch the round ligament in on the Rt side. Following stretching she reported reduced pain with standing Rt hip flexion. Isometric hip add/abd were initiated today and patient reported greater discomfort with hip abduction but was able to complete exercise. She attempted quadruped pelvic tilts and had a severe increase in Rt groin/pelvic  pain. Manual joint mobilization performed to Rt hip with long axis distraction and patient reported relief of Rt hip/groin pain. She was educated on potential of purchasing maternity belt to provide support to her abdomen. She will continue to benefit from skilled PT interventions to address impairments and improve posture and reduce pain.    Personal Factors and Comorbidities  Fitness;Social Background    Examination-Activity Limitations  Caring for Others;Bend;Carry;Lift;Sit;Squat    Examination-Participation Restrictions  Cleaning;Laundry;Community Activity    Stability/Clinical Decision Making  Stable/Uncomplicated    Rehab Potential  Fair    PT Frequency  2x / week    PT Duration  6 weeks    PT Treatment/Interventions  ADLs/Self Care Home Management;Cryotherapy;Electrical Stimulation;Moist Heat;Therapeutic activities;Therapeutic exercise;Functional mobility training;Balance training;Neuromuscular re-education;Patient/family education;Manual techniques;Passive range of motion;Taping    PT Next Visit Plan  Review goals with patient and HEP. Assess posture for any lateral shift and insruct on self correction is present. Perform stabilizaiton exercises and stretching for hips. PRogress through squat and  forward flexion motion with hip hinge focus to progress towards deadlift for strengthening.    PT Home Exercise Plan  Eval: post pelvic tilt, post pelvic tilt with marching, figure four stretch, back extension at counter (repeated); hip abd/add iso, hip flexor stretch for Rt    Consulted and Agree with Plan of Care  Patient       Patient will benefit from skilled therapeutic intervention in order to improve the following deficits and impairments:  Decreased activity tolerance, Decreased range of motion, Decreased strength, Decreased balance, Decreased mobility, Pain, Impaired perceived functional ability, Postural dysfunction, Obesity  Visit Diagnosis: Pain in right hip  Right-sided low back pain  without sciatica, unspecified chronicity  Abnormal posture     Problem List Patient Active Problem List   Diagnosis Date Noted  . Ovarian cyst 07/04/2018  . Supervision of normal pregnancy 06/06/2018  . Short interval between pregnancies affecting pregnancy in first trimester, antepartum 05/19/2018  . 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 Gastroesophageal reflux disease   . Acute gallstone pancreatitis 01/12/2018  . Pancreatitis 01/12/2018  . History of shoulder dystocia in prior pregnancy 12/02/2017  . History of postpartum hemorrhage 12/02/2017  . Acute blood loss anemia 10/30/2017  . Postpartum anemia 10/29/2017  . History of cholestasis during pregnancy 09/21/2017  . History of gestational diabetes 08/24/2017  . Chest pain due to GERD 08/05/2017  . Depression with anxiety and suicidal ideations 06/13/2017  . History of miscarriage 01/18/2017  . Conductive hearing loss, unilat, unrestrict hearing contralateral side 12/10/2015  . Severe episode of recurrent major depressive disorder, without psychotic features ([place])   . 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 Asthma, mild intermittent 02/18/2015  . 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 Perforation of left tympanic membrane 06/19/2014  . Cervicalgia 12/20/2013  . Abdominal pain, chronic, epigastric 08/08/2013  . Acne 08/22/2012  . GAD (generalized anxiety disorder) 08/01/2012  . ADHD (attention deficit hyperactivity disorder), combined type 08/01/2012  . ODD (oppositional defiant disorder) 08/01/2012  . Vasovagal syncope 02/28/2012  . Cholesteatoma of attic of right ear 02/15/2011     Kipp Brood, PT, DPT, [facility] Physical Therapist with [facility]  08/28/2018 4:00 PM    [facility] [address] [place], [place], [address] Phone: [phone]   Fax:  [phone]  Name: GUILLERMINA SHAFT MRN: [id number] Date of Birth: 11/02/1995

## 2018-08-28 NOTE — Telephone Encounter (Signed)
Patient called, stated she is about [redacted] weeks pregnant.  She stated that she started having really strong cramps since last, the baby has been moving a lot.  She is also requesting hemorrhoid medication.  She stated that she is having to strain really hard to use the bathroom.  [employer]  [phone]

## 2018-08-28 NOTE — Patient Instructions (Signed)
Supine Hip Adduction Isometric with Ball reps: 10 sets: 1-2 hold: 8 seconds daily: 1  weekly: 7      Exercise image step 1   Exercise image step 2  Setup  Begin lying on your back with your legs bent, feet resting on the floor, and a soft ball positioned between your knees.  Movement  Squeeze your knees together into the ball, then release and repeat.  Tip  Make sure to keep your back flat against the floor during the exercise. Hooklying Isometric Hip Abduction with Belt reps: 10 sets: 1-2 hold: 8 seconds daily: 1  weekly: 7      Exercise image step 1   Exercise image step 2  Setup  Begin lying on your back with your knees bent and a belt looped around your knees. Movement  Gently press your knees out into the belt, hold briefly, then relax and repeat. Tip  Make sure to continue breathing evenly. There should be little to no movement during the exercise. Disclaimer: This program provides exercises related to your condition that you can perform at home. As there is a risk of injury with any activity, use caution when performing exercises. If you experience any pain or discomfort, discontinue the exercises and contact your health care provider.  Login URL: [email or web address] . Access Code: [email or web address] . Date printed: 08/28/2018 Page 1  Hip Flexor Stretch at [employer] of Bed reps: 10 sets: 1-2 hold: 5 seconds daily: 1  weekly: 7      Exercise image step 1   Exercise image step 2  Setup  Begin lying diagonally at the edge of table or bed, with the right leg furthest from the edge bent. Movement  Leg your right leg hang off the edge of the bed, then pull your bent leg toward your chest and hold. You should feel a stretch in front of your right hip. Tip  Make sure to keep your upper body relaxed and do not to let your low back arch during the stretch.

## 2018-08-28 NOTE — Telephone Encounter (Signed)
Patient states she has a hemorrhoid that started bleeding last night after she had to strain to use the bathroom.  Does not feel like it is very large and she is not having discomfort when she sits.  At this time, she is not using anything to help with the hemorrhoid.  Advised patient to use Preparation H or Tucks pads on the hemorrhoid and to start taking a stool softner daily along with increasing her diet in high fiber foods and water.  Advised if she starts having discomfort with sitting or feels like it has suddenly enlarged, to let us know as some have to be lanced.  Pt verbalized understanding.

## 2018-09-01 ENCOUNTER — Telehealth (HOSPITAL_COMMUNITY): Payer: Self-pay

## 2018-09-01 NOTE — Telephone Encounter (Signed)
I called Ms. Stacey Cook at her home number to discuss her appointment times and adjust her schedule for an earlier appointment. She was agreeable to move her appointment to 12:00 pm-12:45 pm and I thanked her for the flexibility. I told her we will provide a new schedule for her when she arrives for her Monday appointment next week.   Kipp Brood, PT, DPT, [facility] Physical Therapist with [facility]  09/01/2018 9:13 AM

## 2018-09-04 ENCOUNTER — Ambulatory Visit (HOSPITAL_COMMUNITY): Payer: Medicaid Other

## 2018-09-04 ENCOUNTER — Telehealth (HOSPITAL_COMMUNITY): Payer: Self-pay

## 2018-09-04 NOTE — Telephone Encounter (Signed)
She said she could not come due to personal issues and that her aunt died today. If she can get a ride she may call back to get in this week.

## 2018-09-04 NOTE — Telephone Encounter (Signed)
Pt confirmed she could get a ride for Monday 09/11/2018.

## 2018-09-06 ENCOUNTER — Inpatient Hospital Stay (HOSPITAL_COMMUNITY)
Admission: AD | Admit: 2018-09-06 | Discharge: 2018-09-07 | Disposition: A | Discharge status: Home / Self Care | Payer: Medicaid Other | Attending: Family Medicine | Admitting: Family Medicine

## 2018-09-06 ENCOUNTER — Encounter (HOSPITAL_COMMUNITY): Payer: Self-pay

## 2018-09-06 ENCOUNTER — Other Ambulatory Visit: Payer: Self-pay

## 2018-09-06 DIAGNOSIS — Z3402 Encounter for supervision of normal first pregnancy, second trimester: Secondary | ICD-10-CM

## 2018-09-06 DIAGNOSIS — F418 Other specified anxiety disorders: Secondary | ICD-10-CM | POA: Insufficient documentation

## 2018-09-06 DIAGNOSIS — H9190 Unspecified hearing loss, unspecified ear: Secondary | ICD-10-CM | POA: Diagnosis not present

## 2018-09-06 DIAGNOSIS — O4702 False labor before 37 completed weeks of gestation, second trimester: Secondary | ICD-10-CM

## 2018-09-06 DIAGNOSIS — O99342 Other mental disorders complicating pregnancy, second trimester: Secondary | ICD-10-CM | POA: Diagnosis not present

## 2018-09-06 DIAGNOSIS — O26892 Other specified pregnancy related conditions, second trimester: Secondary | ICD-10-CM | POA: Insufficient documentation

## 2018-09-06 DIAGNOSIS — O99512 Diseases of the respiratory system complicating pregnancy, second trimester: Secondary | ICD-10-CM | POA: Insufficient documentation

## 2018-09-06 DIAGNOSIS — R109 Unspecified abdominal pain: Secondary | ICD-10-CM | POA: Insufficient documentation

## 2018-09-06 DIAGNOSIS — Z888 Allergy status to other drugs, medicaments and biological substances status: Secondary | ICD-10-CM | POA: Diagnosis not present

## 2018-09-06 DIAGNOSIS — Z3A21 21 weeks gestation of pregnancy: Secondary | ICD-10-CM | POA: Diagnosis not present

## 2018-09-06 DIAGNOSIS — Z881 Allergy status to other antibiotic agents status: Secondary | ICD-10-CM | POA: Insufficient documentation

## 2018-09-06 DIAGNOSIS — Z885 Allergy status to narcotic agent status: Secondary | ICD-10-CM | POA: Diagnosis not present

## 2018-09-06 DIAGNOSIS — Z87891 Personal history of nicotine dependence: Secondary | ICD-10-CM | POA: Insufficient documentation

## 2018-09-06 DIAGNOSIS — J45909 Unspecified asthma, uncomplicated: Secondary | ICD-10-CM | POA: Diagnosis not present

## 2018-09-06 DIAGNOSIS — Z79899 Other long term (current) drug therapy: Secondary | ICD-10-CM | POA: Diagnosis not present

## 2018-09-06 LAB — URINALYSIS, ROUTINE W REFLEX MICROSCOPIC
Bilirubin Urine: NEGATIVE
Glucose, UA: NEGATIVE mg/dL
Hgb urine dipstick: NEGATIVE
Ketones, ur: NEGATIVE mg/dL
Leukocytes,Ua: NEGATIVE
Nitrite: NEGATIVE
Protein, ur: NEGATIVE mg/dL
Specific Gravity, Urine: 1.017 (ref 1.005–1.030)
pH: 6 (ref 5.0–8.0)

## 2018-09-06 NOTE — MAU Note (Signed)
Pt is having BH ctx, some are 15 minutes, some are every hour. Having back pain and pressure in her pelvis. Has been having the pain for 3 days. Pain rated 6-9/10. No LOF or bleeding. Last intercourse last night.

## 2018-09-07 DIAGNOSIS — Z3A21 21 weeks gestation of pregnancy: Secondary | ICD-10-CM

## 2018-09-07 DIAGNOSIS — O4702 False labor before 37 completed weeks of gestation, second trimester: Secondary | ICD-10-CM

## 2018-09-07 NOTE — MAU Provider Note (Signed)
History     CSN: [id number]  Arrival date and time: 09/06/18 2147   None     Chief Complaint  Patient presents with  . Contractions   HPI Ms Stacey Cook is a 23yo [id number] @ 21.6wks who presents for eval of irreg abd tightening, worse when walking, going on x 3 days. Denies leaking or bldg. No H/A, N/V or visual disturbances. Reports itching on hands. Her preg has been followed by the [facility] service and has been remarkable for 1) hearing impaired 2) hx cholestasis 3) hx GDM 4) hx PPH 5) hx shoulder dystocia  Chart review: had bile acids drawn on 4/13 since she was having itching at last PNV- resulted at 2.8  OB History    Gravida  3   Para  1   Term  1   Preterm  0   AB  1   Living  1     SAB  1   TAB  0   Ectopic  0   Multiple  0   Live Births  1           Past Medical History:  Diagnosis Date  . Abdominal pain   . ADD (attention deficit disorder)   . Anemia   . Anginal pain ([place])   . Anxiety   . Arthritis    knees  . Asthma   . 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 Cervicalgia   . Cholestasis during pregnancy   . Chronic abdominal pain   . Complication of anesthesia    light headed  . Constipation   . Depression    not on meds  . Depression with anxiety 06/13/2017   06/13/17 rx zoloft 25mg  daily           06/06/18 w/ occ SI, rx Lexapro 10mg , [facility] for therapy  . Ear mass   . Gestational diabetes    pt states not been checking sugars regularly at home; nor has she been taking her Metformin  . Gestational diabetes   . Headache   . HSV infection   . Low iron   . PONV (postoperative nausea and vomiting)   . Suicidal intent   . UTI (lower urinary tract infection) 05/2014  . Vomiting     Past Surgical History:  Procedure Laterality Date  . CHOLECYSTECTOMY N/A 01/16/2018   Procedure: LAPAROSCOPIC CHOLECYSTECTOMY;  Surgeon: Aviva Signs, MD;  Location: [facility];  Service: General;  Laterality: N/A;  per Dr.  Arnoldo Morale, pt will most likely go home and he will tell her to arrive at 10:45 - already has labs  . CHOLESTEATOMA EXCISION    . DILATION AND EVACUATION N/A 09/11/2016   Procedure: DILATATION AND CURRATAGE  2ND TRIMESTER;  Surgeon: Jonnie Kind, MD;  Location: [facility];  Service: Gynecology;  Laterality: N/A;  . IMPLANTATION BONE ANCHORED HEARING AID Right 04/2013  . INNER EAR SURGERY Right 03/31/2018  . 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 TYMPANOPLASTY Left   . TYMPANOSTOMY      Family History  Problem Relation Age of Onset  . Cholelithiasis Mother   . Kidney disease Mother        stones  . Depression Mother   . Hypertension Maternal Grandmother   . Diabetes Maternal Grandmother   . Stroke Maternal Grandmother   . Seizures Maternal Grandmother   . Asthma Maternal Grandmother   . Hyperlipidemia Maternal Grandmother   . Thyroid  disease Maternal Grandmother   . Asthma Brother   . Seizures Maternal Uncle   . Cancer Other        breast- great aunt  . Celiac disease Neg Hx     Social History   Tobacco Use  . Smoking status: Former Smoker    Packs/day: 0.50    Years: 1.00    Pack years: 0.50    Types: Cigarettes    Last attempt to quit: 03/11/2017    Years since quitting: 1.4  . Smokeless tobacco: Never Used  Substance Use Topics  . Alcohol use: No    Alcohol/week: 0.0 standard drinks  . Drug use: No    Allergies:  Allergies  Allergen Reactions  . Fentanyl Nausea And Vomiting  . Keflex [Cephalexin] Other (See Comments)    Reaction:  Elevated liver enzymes   . Other Other (See Comments)    Pt is allergic to surgical glue swelling;  Cats-congestion, sneezing, eyes watery, itching all over Reaction:  Infection   . Adhesive [Tape] Rash    Medications Prior to Admission  Medication Sig Dispense Refill Last Dose  . cyclobenzaprine (FLEXERIL) 10 MG tablet Take 1 tablet (10 mg total) by mouth 2 (two) times daily as needed for muscle spasms. 20  tablet 0 Past Week at Unknown time  . escitalopram (LEXAPRO) 10 MG tablet Take 1 tablet (10 mg total) by mouth daily. 30 tablet 6 09/05/2018 at Unknown time  . prenatal vitamin w/FE, FA (PRENATAL 1 + 1) 27-1 MG TABS tablet Take 1 tablet by mouth daily at 12 noon. 30 each 12 09/06/2018 at Unknown time  . promethazine (PHENERGAN) 25 MG tablet Take 1 tablet (25 mg total) by mouth every 6 (six) hours as needed for nausea or vomiting. 30 tablet 1 Past Month at Unknown time  . acetaminophen (TYLENOL) 500 MG tablet Take 500 mg by mouth as needed.   Taking  . Blood Pressure Monitoring (BLOOD PRESSURE MONITOR AUTOMAT) DEVI Take BP at home per provider instructions 1 Device 0   . metroNIDAZOLE (FLAGYL) 500 MG tablet Take 1 tablet (500 mg total) by mouth 2 (two) times daily. (Patient not taking: Reported on 08/17/2018) 14 tablet 0 Not Taking    Review of Systems no other pertinents other than what is listed in HPI Physical Exam   Blood pressure (!) 114/59, pulse 96, temperature 98.7 F (37.1 C), temperature source Oral, resp. rate 16, weight 86.1 kg, last menstrual period 03/29/2018, SpO2 100 %, not currently breastfeeding.  Physical Exam  Constitutional: She is oriented to person, place, and time. She appears well-developed.  HENT:  Head: Normocephalic.  Neck: Normal range of motion.  Cardiovascular: Normal rate.  Respiratory: Effort normal.  GI:  FHT doppler 155 Abd soft, NT  Genitourinary:    Vagina normal.     Genitourinary Comments: Cx C/L/mid position   Musculoskeletal: Normal range of motion.  Neurological: She is alert and oriented to person, place, and time.  Skin: Skin is warm and dry.  Psychiatric: She has a normal mood and affect. Her behavior is normal. Thought content normal.   Urinalysis    Component Value Date/Time   COLORURINE YELLOW 09/06/2018 2206   APPEARANCEUR HAZY (A) 09/06/2018 2206   APPEARANCEUR Cloudy (A) 06/06/2018 1504   LABSPEC 1.017 09/06/2018 2206   PHURINE 6.0  09/06/2018 [place] 09/06/2018 2206   HGBUR NEGATIVE 09/06/2018 2206   BILIRUBINUR NEGATIVE 09/06/2018 2206   BILIRUBINUR Negative 06/06/2018 1504   KETONESUR  NEGATIVE 09/06/2018 2206   PROTEINUR NEGATIVE 09/06/2018 2206   UROBILINOGEN 0.2 04/09/2015 1213   UROBILINOGEN 0.2 01/12/2015 2120   NITRITE NEGATIVE 09/06/2018 2206   LEUKOCYTESUR NEGATIVE 09/06/2018 2206    MAU Course  Procedures  MDM Urinalysis  Assessment and Plan  IUP@21 .6wks Abd discomfort Itching- neg bile acids  -D/C home with preterm labor precautions -Call or come to MAU with increasing ctx that don't resolve when sitting -Keep next scheduled visit  [place] 09/07/2018, 12:09 AM

## 2018-09-07 NOTE — Discharge Instructions (Signed)
Abdominal Pain During Pregnancy    Belly (abdominal) pain is common during pregnancy. There are many possible causes. Most of the time, it is not a serious problem. Other times, it can be a sign that something is wrong with the pregnancy. Always tell your doctor if you have belly pain.  Follow these instructions at home:  Do not have sex or put anything in your vagina until your pain goes away completely.  Get plenty of rest until your pain gets better.  Drink enough fluid to keep your pee (urine) pale yellow.  Take over-the-counter and prescription medicines only as told by your doctor.  Keep all follow-up visits as told by your doctor. This is important.  Contact a doctor if:  Your pain continues or gets worse after resting.  You have lower belly pain that:  Comes and goes at regular times.  Spreads to your back.  Feels like menstrual cramps.  You have pain or burning when you pee (urinate).  Get help right away if:  You have a fever or chills.  You have vaginal bleeding.  You are leaking fluid from your vagina.  You are passing tissue from your vagina.  You throw up (vomit) for more than 24 hours.  You have watery poop (diarrhea) for more than 24 hours.  Your baby is moving less than usual.  You feel very weak or faint.  You have shortness of breath.  You have very bad pain in your upper belly.  Summary  Belly (abdominal) pain is common during pregnancy. There are many possible causes.  If you have belly pain during pregnancy, tell your doctor right away.  Keep all follow-up visits as told by your doctor. This is important.  This information is not intended to replace advice given to you by your health care provider. Make sure you discuss any questions you have with your health care provider.  Document Released: 04/14/2009 Document Revised: 07/29/2016 Document Reviewed: 07/29/2016  Elsevier Interactive Patient Education  2019 [employer]

## 2018-09-11 ENCOUNTER — Ambulatory Visit (HOSPITAL_COMMUNITY): Payer: Medicaid Other | Attending: Advanced Practice Midwife

## 2018-09-11 ENCOUNTER — Encounter: Payer: Self-pay | Admitting: *Deleted

## 2018-09-11 ENCOUNTER — Other Ambulatory Visit: Payer: Self-pay

## 2018-09-11 ENCOUNTER — Encounter (HOSPITAL_COMMUNITY): Payer: Self-pay

## 2018-09-11 DIAGNOSIS — M545 Low back pain, unspecified: Secondary | ICD-10-CM

## 2018-09-11 DIAGNOSIS — M25551 Pain in right hip: Secondary | ICD-10-CM | POA: Diagnosis not present

## 2018-09-11 DIAGNOSIS — R293 Abnormal posture: Secondary | ICD-10-CM | POA: Diagnosis present

## 2018-09-11 NOTE — Patient Instructions (Signed)
Supine Dead Bug with Leg Extension reps: 10 sets: 3 daily: 1 weekly: 7   Exercise image step 1   Exercise image step 2  Setup  Begin lying on your back with your knees bent and feet flat on the floor. Movement  Tighten your abdominals, lift both legs to a 90 degree angle and your arms up toward the ceiling. Slowly lower one arm overhead and you straighten your opposite leg at the same time. Return to the starting position and repeat with your other arm and leg. Tip  Make sure to keep your abdominals tight and back flat on the floor during the exercise. Flutter Kick reps: 3 hold: 30 seconds daily: 1 weekly: 7   Exercise image step 1   Exercise image step 2  Setup  Begin lying on your back with your hands under your hips. Movement  Engage your abdominals, as you quickly alternate kicking your legs up and down. Tip  Make sure to keep your core engaged and do not let your back arch during the exercise. Disclaimer: This program provides exercises related to your condition that you can perform at home. As there is a risk of injury with any activity, use caution when performing exercises. If you experience any pain or discomfort, discontinue the exercises and contact your health care provider.  Login URL: [email or web address] . Access Code: [email or web address] . Date printed: 09/11/2018 Page 1  Seated Pelvic Tilt reps: 10 sets: 3 hold: 5 seconds daily: 1  weekly: 7      Exercise image step 1   Exercise image step 2  Setup  Begin sitting upright in a chair with your hands on your hips. Movement  Gently tilt your pelvis backward, then return to a neutral position, and tilt it forward. Repeat, monitoring the movement with your hands. Tip  Make sure to keep your upper back relaxed during the exercise, and focus the movement just on your pelvis.

## 2018-09-11 NOTE — Therapy (Signed)
[facility] [place], [place], [address] Phone: [phone]   Fax:  [phone]  Physical Therapy Treatment  Patient Details  Name: Stacey Cook MRN: [id number] Date of Birth: 1995-11-29 Referring Provider (PT): Christin Fudge, [place]   Encounter Date: 09/11/2018  PT End of Session - 09/11/18 1239    Visit Number  3    Number of Visits  12    Date for PT Re-Evaluation  09/11/18    Authorization Type  Medicaid [place] (requested 12 units on 08/21/18)    Authorization Time Period  08/21/18-10/02/18    Authorization - Visit Number  2    Authorization - Number of Visits  12    PT Start Time  1206    PT Stop Time  1246    PT Time Calculation (min)  40 min    Activity Tolerance  Patient tolerated treatment well    Behavior During Therapy  [facility] for tasks assessed/performed       Past Medical History:  Diagnosis Date  . Abdominal pain   . ADD (attention deficit disorder)   . Anemia   . Anginal pain ([place])   . Anxiety   . Arthritis    knees  . Asthma   . 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 Cervicalgia   . Cholestasis during pregnancy   . Chronic abdominal pain   . Complication of anesthesia    light headed  . Constipation   . Depression    not on meds  . Depression with anxiety 06/13/2017   06/13/17 rx zoloft 25mg  daily           06/06/18 w/ occ SI, rx Lexapro 10mg , [facility] for therapy  . Ear mass   . Gestational diabetes    pt states not been checking sugars regularly at home; nor has she been taking her Metformin  . Gestational diabetes   . Headache   . HSV infection   . Low iron   . PONV (postoperative nausea and vomiting)   . Suicidal intent   . UTI (lower urinary tract infection) 05/2014  . Vomiting     Past Surgical History:  Procedure Laterality Date  . CHOLECYSTECTOMY N/A 01/16/2018   Procedure: LAPAROSCOPIC CHOLECYSTECTOMY;  Surgeon: Aviva Signs, MD;  Location: [facility];  Service: General;  Laterality: N/A;  per Dr. Arnoldo Morale, pt will most likely go home and he will tell her to arrive at 10:45 - already has labs  . CHOLESTEATOMA EXCISION    . DILATION AND EVACUATION N/A 09/11/2016   Procedure: DILATATION AND CURRATAGE  2ND TRIMESTER;  Surgeon: Jonnie Kind, MD;  Location: [facility];  Service: Gynecology;  Laterality: N/A;  . IMPLANTATION BONE ANCHORED HEARING AID Right 04/2013  . INNER EAR SURGERY Right 03/31/2018  . 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 TYMPANOPLASTY Left   . TYMPANOSTOMY      There were no vitals filed for this visit.  Subjective Assessment - 09/11/18 1209    Subjective  Patient reports her Rt hip/groin/pelvis is feeling better. She states she can squat now but that it hurts still when she comes up to stand from the squat. She reports her lower back has been bothering her more and that she spoke with her OBGYN office and they informed her medicaid usually covers a bellie band or brace if prescribed. She plans to get it at [facility].  pt is interetested in telehealth to continue services   Pertinent History  [redacted] weeks pregnant on 08/21/18; 1st birth ~9 months ago    Limitations  Sitting;Standing;Lifting;House hold activities    How long can you sit comfortably?  a couple minutes    How long can you stand comfortably?  several minutes    How long can you walk comfortably?  several minutes    Patient Stated Goals  to pick her son up without pain    Currently in Pain?  Yes    Pain Score  7     Pain Location  Back    Pain Orientation  Lower;Posterior    Pain Descriptors / Indicators  Dull    Pain Type  Chronic pain    Pain Onset  1 to 4 weeks ago    Pain Frequency  Constant    Aggravating Factors   squatting, standing or sitting prolonged periods    Pain Relieving Factors  rest, laying on Rt side    Effect of Pain on Daily Activities  moderate    Multiple Pain Sites  No        [facility] Adult PT  Treatment/Exercise - 09/11/18 0001      Posture/Postural Control   Posture Comments  SIJ motion testing in sitting (Gillet Test): no abnormalities bilaterally      Exercises   Exercises  Lumbar      Lumbar Exercises: Seated   Other Seated Lumbar Exercises  Pelvic tilt (ant/post) 10 reps 5 sec holds      Lumbar Exercises: Supine   Pelvic Tilt  15 reps;5 seconds    Pelvic Tilt Limitations  post pelvic tilt    Dead Bug  5 reps;Limitations    Dead Bug Limitations  3 sets; cue for post pelvic tilt    Other Supine Lumbar Exercises  3x 30 seconds flutter kicks with posterior pelvic tilt    Other Supine Lumbar Exercises  Scissor kicks: 3x 30 seconds       Manual Therapy   Manual Therapy  Joint mobilization    Manual therapy comments  performed seperate from other interventions    Joint Mobilization  Lateral glide, grade III, bil Lumbar spine, 6 minutes each side        PT Education - 09/11/18 1333    Education Details  Educated on pelvic tilt stretch for round ligament stretching and on new exercies for abdominal stabilization exercises.     Person(s) Educated  Patient    Methods  Explanation;Handout;Demonstration    Comprehension  Verbalized understanding;Returned demonstration       PT Short Term Goals - 08/21/18 1535      PT SHORT TERM GOAL #1   Title  Patient will be independent with HEP, updated PRN, and demonstrate proper form with exercises.    Time  2    Period  Weeks    Status  New    Target Date  09/04/18      PT SHORT TERM GOAL #2   Title  Patient will be able to perform 10 squats to touch chair with no pain in order to improve squat mechanics to perform activities like laundry and caring for her son.     Time  3    Period  Weeks    Status  New    Target Date  09/11/18      PT SHORT TERM GOAL #3   Title  Patient will have no pain with lumbar flexion and  achieve full AROM to demonstrate improved tolerance to aggravating postures.    Time  3    Period  Weeks     Status  New        PT Long Term Goals - 08/21/18 1538      PT LONG TERM GOAL #1   Title  Patient will demonstrate proper lifting mechanics to lift 10lbs off the floor to chest height to dmeonstrate increased tolerance to loading lumbar spine to care for her son.     Time  6    Period  Weeks    Status  New    Target Date  10/02/18      PT LONG TERM GOAL #2   Title  Patient will hav eno pain with MMT for Rt hip and achieve 5/5 strength throughout to equal Lt LE and indicate improve tolerance to loading her soft tissue.    Time  6    Period  Weeks    Status  New      PT LONG TERM GOAL #3   Title  Patient will report no pain increase in her Rt hip for 1 week while being able to pick up her son and carry him to care for his daily needs.     Time  6    Period  Weeks    Status  New        Plan - Sep 16, 2018 1243    Clinical Impression Statement  Session initiated with manual interventions to reduce lower back pain. Patient reported discomfort with lumbopelvic rotational glide and transitioned to lateral glide in side-lying, performed bilaterally. Continued with abdominal exercises and progressed to post pelvic tilt isometric with UE/LE movements. Patient required external support using hands at sacrum half way through flutter & scissor kicks but was able to maintain posterior tilt with dead bug exercise. She reported some discomfort with LE scissor kick and this was not added to HEP. Patient completed "cat/cow" stretch in sitting rather than quadruped and denied any pain with this. Discussed belly band/brace recommendations and will determine what options are available at [facility] for patient as she requested this location. She will continue to benefit from skilled PT interventions to address impairments and improve posture and reduce pain.    Personal Factors and Comorbidities  Fitness;Social Background    Examination-Activity Limitations  Caring for Others;Bend;Carry;Lift;Sit;Squat     Examination-Participation Restrictions  Cleaning;Laundry;Community Activity    Stability/Clinical Decision Making  Stable/Uncomplicated    Rehab Potential  Fair    PT Frequency  2x / week    PT Duration  6 weeks    PT Treatment/Interventions  ADLs/Self Care Home Management;Cryotherapy;Electrical Stimulation;Moist Heat;Therapeutic activities;Therapeutic exercise;Functional mobility training;Balance training;Neuromuscular re-education;Patient/family education;Manual techniques;Passive range of motion;Taping    PT Next Visit Plan  Review goals with patient and HEP. Assess posture for any lateral shift and insruct on self correction is present. Perform stabilizaiton exercises and stretching for hips. PRogress through squat and forward flexion motion with hip hinge focus to progress towards deadlift for strengthening.    PT Home Exercise Plan  Eval: post pelvic tilt, post pelvic tilt with marching, figure four stretch, back extension at counter (repeated); hip abd/add iso, hip flexor stretch for Rt; Sep 16, 2018 - flutter kick, dead bug, seated pelvic tilt.    Consulted and Agree with Plan of Care  Patient       Patient will benefit from skilled therapeutic intervention in order to improve the following deficits and impairments:  Decreased activity  tolerance, Decreased range of motion, Decreased strength, Decreased balance, Decreased mobility, Pain, Impaired perceived functional ability, Postural dysfunction, Obesity  Visit Diagnosis: Pain in right hip  Right-sided low back pain without sciatica, unspecified chronicity  Abnormal posture     Problem List Patient Active Problem List   Diagnosis Date Noted  . Ovarian cyst 07/04/2018  . Supervision of normal pregnancy 06/06/2018  . Short interval between pregnancies affecting pregnancy in first trimester, antepartum 05/19/2018  . 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  Gastroesophageal reflux disease   . Acute gallstone pancreatitis 01/12/2018  . Pancreatitis 01/12/2018  . History of shoulder dystocia in prior pregnancy 12/02/2017  . History of postpartum hemorrhage 12/02/2017  . Acute blood loss anemia 10/30/2017  . Postpartum anemia 10/29/2017  . History of cholestasis during pregnancy 09/21/2017  . History of gestational diabetes 08/24/2017  . Chest pain due to GERD 08/05/2017  . Depression with anxiety and suicidal ideations 06/13/2017  . History of miscarriage 01/18/2017  . Conductive hearing loss, unilat, unrestrict hearing contralateral side 12/10/2015  . Severe episode of recurrent major depressive disorder, without psychotic features ([place])   . 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 Asthma, mild intermittent 02/18/2015  . 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 Perforation of left tympanic membrane 06/19/2014  . Cervicalgia 12/20/2013  . Abdominal pain, chronic, epigastric 08/08/2013  . Acne 08/22/2012  . GAD (generalized anxiety disorder) 08/01/2012  . ADHD (attention deficit hyperactivity disorder), combined type 08/01/2012  . ODD (oppositional defiant disorder) 08/01/2012  . Vasovagal syncope 02/28/2012  . Cholesteatoma of attic of right ear 02/15/2011    Kipp Brood, PT, DPT, [facility] Physical Therapist with [facility]  09/11/2018 1:41 PM    [facility] [address] [place], [place], [address] Phone: [phone]   Fax:  [phone]  Name: Stacey Cook MRN: [id number] Date of Birth: 06-17-1995

## 2018-09-12 ENCOUNTER — Ambulatory Visit (INDEPENDENT_AMBULATORY_CARE_PROVIDER_SITE_OTHER): Payer: Medicaid Other | Admitting: Women's Health

## 2018-09-12 ENCOUNTER — Encounter: Payer: Self-pay | Admitting: Women's Health

## 2018-09-12 VITALS — BP 121/83 | HR 104

## 2018-09-12 DIAGNOSIS — F418 Other specified anxiety disorders: Secondary | ICD-10-CM

## 2018-09-12 DIAGNOSIS — L299 Pruritus, unspecified: Secondary | ICD-10-CM

## 2018-09-12 DIAGNOSIS — Z3482 Encounter for supervision of other normal pregnancy, second trimester: Secondary | ICD-10-CM

## 2018-09-12 DIAGNOSIS — Z3A22 22 weeks gestation of pregnancy: Secondary | ICD-10-CM

## 2018-09-12 MED ORDER — ESCITALOPRAM OXALATE 20 MG PO TABS: 20.0000 mg | ORAL_TABLET | Freq: Every day | ORAL | 6 refills | Status: DC

## 2018-09-12 NOTE — Patient Instructions (Signed)
Stacey Cook, I greatly value your feedback.  If you receive a survey following your visit with [name] today, we appreciate you taking the time to fill it out.  Thanks, Stacey Cook, CNM, WHNP-BC   You will have your sugar test next visit.  Please do not eat or drink anything after midnight the night before you come, not even water.  You will be here for at least two hours.     [name]!!! It is now Stacey Cook at [facility] ([place], [place] [address]) Entrance located off of [address] parking   Call the office [phone]) or go to [facility] if:  You begin to have strong, frequent contractions  Your water breaks.  Sometimes it is a big gush of fluid, sometimes it is just a trickle that keeps getting your panties wet or running down your legs  You have vaginal bleeding.  It is normal to have a small amount of spotting if your cervix was checked.   You don't feel your baby moving like normal.  If you don't, get you something to eat and drink and lay down and focus on feeling your baby move.   If your baby is still not moving like normal, you should call the office or go to [facility].  For itching:  . Avoid hot showers/baths, take cool/luke-warm showers/baths instead . Cool washcloths to itchy areas . Aquaphor or Eucerin creams to moisturize the skin . Hydrocortisone or Benadryl cream, or Benadryl by mouth for the itching . Oatmeal baths    Home Blood Pressure Monitoring for Patients   Your provider has recommended that you check your blood pressure (BP) at least once a week at home. If you do not have a blood pressure cuff at home, one will be provided for you. Contact your provider if you have not received your monitor within 1 week.   Helpful Tips for Accurate Home Blood Pressure Checks  . Don't smoke, exercise, or drink caffeine 30 minutes before checking your BP . Use the restroom before checking your BP  (a full bladder can raise your pressure) . Relax in a comfortable upright chair . Feet on the ground . Left arm resting comfortably on a flat surface at the level of your heart . Legs uncrossed . Back supported . Sit quietly and don't talk . Place the cuff on your bare arm . Adjust snuggly, so that only two fingertips can fit between your skin and the top of the cuff . Check 2 readings separated by at least one minute . Keep a log of your BP readings . For a visual, please reference this diagram: http://ccnc.care/bpdiagram  Provider Name: [facility]     Phone: [phone]  Zone 1: ALL CLEAR  Continue to monitor your symptoms:  . BP reading is less than 140 (top number) or less than 90 (bottom number)  . No right upper stomach pain . No headaches or seeing spots . No feeling nauseated or throwing up . No swelling in face and hands  Zone 2: CAUTION Call your doctor's office for any of the following:  . BP reading is greater than 140 (top number) or greater than 90 (bottom number)  . Stomach pain under your ribs in the middle or right side . Headaches or seeing spots . Feeling nauseated or throwing up . Swelling in face and hands  Zone 3: EMERGENCY  Seek immediate medical  care if you have any of the following:  . BP reading is greater than160 (top number) or greater than 110 (bottom number) . Severe headaches not improving with Tylenol . Serious difficulty catching your breath . Any worsening symptoms from Zone 2    Second Trimester of Pregnancy The second trimester is from week 13 through week 28, months 4 through 6. The second trimester is often a time when you feel your best. Your body has also adjusted to being pregnant, and you begin to feel better physically. Usually, morning sickness has lessened or quit completely, you may have more energy, and you may have an increase in appetite. The second trimester is also a time when the fetus is growing rapidly. At the end  of the sixth month, the fetus is about 9 inches long and weighs about 1 pounds. You will likely begin to feel the baby move (quickening) between 18 and 20 weeks of the pregnancy. BODY CHANGES Your body goes through many changes during pregnancy. The changes vary from woman to woman.   Your weight will continue to increase. You will notice your lower abdomen bulging out.  You may begin to get stretch marks on your hips, abdomen, and breasts.  You may develop headaches that can be relieved by medicines approved by your health care provider.  You may urinate more often because the fetus is pressing on your bladder.  You may develop or continue to have heartburn as a result of your pregnancy.  You may develop constipation because certain hormones are causing the muscles that push waste through your intestines to slow down.  You may develop hemorrhoids or swollen, bulging veins (varicose veins).  You may have back pain because of the weight gain and pregnancy hormones relaxing your joints between the bones in your pelvis and as a result of a shift in weight and the muscles that support your balance.  Your breasts will continue to grow and be tender.  Your gums may bleed and may be sensitive to brushing and flossing.  Dark spots or blotches (chloasma, mask of pregnancy) may develop on your face. This will likely fade after the baby is born.  A dark line from your belly button to the pubic area (linea nigra) may appear. This will likely fade after the baby is born.  You may have changes in your hair. These can include thickening of your hair, rapid growth, and changes in texture. Some women also have hair loss during or after pregnancy, or hair that feels dry or thin. Your hair will most likely return to normal after your baby is born. WHAT TO EXPECT AT YOUR PRENATAL VISITS During a routine prenatal visit:  You will be weighed to make sure you and the fetus are growing normally.  Your  blood pressure will be taken.  Your abdomen will be measured to track your baby's growth.  The fetal heartbeat will be listened to.  Any test results from the previous visit will be discussed. Your health care provider may ask you:  How you are feeling.  If you are feeling the baby move.  If you have had any abnormal symptoms, such as leaking fluid, bleeding, severe headaches, or abdominal cramping.  If you have any questions. Other tests that may be performed during your second trimester include:  Blood tests that check for:  Low iron levels (anemia).  Gestational diabetes (between 24 and 28 weeks).  Rh antibodies.  Urine tests to check for infections, diabetes, or  protein in the urine.  An ultrasound to confirm the proper growth and development of the baby.  An amniocentesis to check for possible genetic problems.  Fetal screens for spina bifida and Down syndrome. HOME CARE INSTRUCTIONS   Avoid all smoking, herbs, alcohol, and unprescribed drugs. These chemicals affect the formation and growth of the baby.  Follow your health care provider's instructions regarding medicine use. There are medicines that are either safe or unsafe to take during pregnancy.  Exercise only as directed by your health care provider. Experiencing uterine cramps is a good sign to stop exercising.  Continue to eat regular, healthy meals.  Wear a good support bra for breast tenderness.  Do not use hot tubs, steam rooms, or saunas.  Wear your seat belt at all times when driving.  Avoid raw meat, uncooked cheese, cat litter boxes, and soil used by cats. These carry germs that can cause birth defects in the baby.  Take your prenatal vitamins.  Try taking a stool softener (if your health care provider approves) if you develop constipation. Eat more high-fiber foods, such as fresh vegetables or fruit and whole grains. Drink plenty of fluids to keep your urine clear or pale yellow.  Take warm  sitz baths to soothe any pain or discomfort caused by hemorrhoids. Use hemorrhoid cream if your health care provider approves.  If you develop varicose veins, wear support hose. Elevate your feet for 15 minutes, 3-4 times a day. Limit salt in your diet.  Avoid heavy lifting, wear low heel shoes, and practice good posture.  Rest with your legs elevated if you have leg cramps or low back pain.  Visit your dentist if you have not gone yet during your pregnancy. Use a soft toothbrush to brush your teeth and be gentle when you floss.  A sexual relationship may be continued unless your health care provider directs you otherwise.  Continue to go to all your prenatal visits as directed by your health care provider. SEEK MEDICAL CARE IF:   You have dizziness.  You have mild pelvic cramps, pelvic pressure, or nagging pain in the abdominal area.  You have persistent nausea, vomiting, or diarrhea.  You have a bad smelling vaginal discharge.  You have pain with urination. SEEK IMMEDIATE MEDICAL CARE IF:   You have a fever.  You are leaking fluid from your vagina.  You have spotting or bleeding from your vagina.  You have severe abdominal cramping or pain.  You have rapid weight gain or loss.  You have shortness of breath with chest pain.  You notice sudden or extreme swelling of your face, hands, ankles, feet, or legs.  You have not felt your baby move in over an hour.  You have severe headaches that do not go away with medicine.  You have vision changes. Document Released: 04/20/2001 Document Revised: 05/01/2013 Document Reviewed: 06/27/2012 [facility] Patient Information 2015 [place], [place]. This information is not intended to replace advice given to you by your health care provider. Make sure you discuss any questions you have with your health care provider.     Coronavirus (COVID-19) Are you at risk?  Are you at risk for the Coronavirus (COVID-19)?  To be considered HIGH  RISK for Coronavirus (COVID-19), you have to meet the following criteria:  . Traveled to [place], [place], [place], [place] or [place]; or in the [place] to [place], [place], [place], or [place]; and have fever, cough, and shortness of breath within the last  2 weeks of travel OR . Been in close contact with a person diagnosed with COVID-19 within the last 2 weeks and have fever, cough, and shortness of breath . IF YOU DO NOT MEET THESE CRITERIA, YOU ARE CONSIDERED LOW RISK FOR COVID-19.  What to do if you are HIGH RISK for COVID-19?  Marland Kitchen If you are having a medical emergency, call 911. . Seek medical care right away. Before you go to a doctor's office, urgent care or emergency department, call ahead and tell them about your recent travel, contact with someone diagnosed with COVID-19, and your symptoms. You should receive instructions from your physician's office regarding next steps of care.  . When you arrive at healthcare provider, tell the healthcare staff immediately you have returned from visiting [place], [place], [place], [place] or [place]; or traveled in the [place] to [place], [place], [place], or [place]; in the last two weeks or you have been in close contact with a person diagnosed with COVID-19 in the last 2 weeks.   . Tell the health care staff about your symptoms: fever, cough and shortness of breath. . After you have been seen by a medical provider, you will be either: o Tested for (COVID-19) and discharged home on quarantine except to seek medical care if symptoms worsen, and asked to  - Stay home and avoid contact with others until you get your results (4-5 days)  - Avoid travel on public transportation if possible (such as bus, train, or airplane) or o Sent to the Emergency Department by EMS for evaluation, COVID-19 testing, and possible admission depending on your condition and test results.  What to do if you are LOW RISK for COVID-19?  Reduce your risk  of any infection by using the same precautions used for avoiding the common cold or flu:  Marland Kitchen Wash your hands often with soap and warm water for at least 20 seconds.  If soap and water are not readily available, use an alcohol-based hand sanitizer with at least 60% alcohol.  . If coughing or sneezing, cover your mouth and nose by coughing or sneezing into the elbow areas of your shirt or coat, into a tissue or into your sleeve (not your hands). . Avoid shaking hands with others and consider head nods or verbal greetings only. . Avoid touching your eyes, nose, or mouth with unwashed hands.  . Avoid close contact with people who are sick. . Avoid places or events with large numbers of people in one location, like concerts or sporting events. . Carefully consider travel plans you have or are making. . If you are planning any travel outside or inside the [name], visit the CDC's Travelers' Health webpage for the latest health notices. . If you have some symptoms but not all symptoms, continue to monitor at home and seek medical attention if your symptoms worsen. . If you are having a medical emergency, call 911.   [place] / e-Visit: [email or web address]         [facility]: [facility]: [phone]                   [facility]: [phone]

## 2018-09-12 NOTE — Progress Notes (Signed)
TELEHEALTH VIRTUAL OBSTETRICS VISIT ENCOUNTER NOTE Patient name: Stacey Cook MRN [id number]  Date of birth: 10-01-1995  I connected with patient on 09/12/18 at  3:45 PM EDT by [facility] and verified that I am speaking with the correct person using two identifiers. Due to COVID-19 recommendations, pt is not currently in our office.    I discussed the limitations, risks, security and privacy concerns of performing an evaluation and management service by telephone and the availability of in person appointments. I also discussed with the patient that there may be a patient responsible charge related to this service. The patient expressed understanding and agreed to proceed.  Chief Complaint:   Routine Prenatal Visit  History of Present Illness:   Stacey Cook is a 23 y.o. G61P1011 female at [redacted]w[redacted]d with an Estimated Date of Delivery: 01/12/19 being evaluated today for ongoing management of a low-risk pregnancy.  Today she reports itching, getting worse, mostly feet, worse at night. Has h/o ICP, last check of bile acids were normoal. Also wants to increase Lexapro, has had 3 deaths recently in her family, depression has worsened. Denies SI/HI. Contractions: Irritability. Vag. Bleeding: None.  Movement: Present. denies leaking of fluid. Review of Systems:   Pertinent items are noted in HPI Denies abnormal vaginal discharge w/ itching/odor/irritation, headaches, visual changes, shortness of breath, chest pain, abdominal pain, severe nausea/vomiting, or problems with urination or bowel movements unless otherwise stated above. Pertinent History Reviewed:  Reviewed past medical,surgical, social, obstetrical and family history.  Reviewed problem list, medications and allergies. Physical Assessment:   Vitals:   09/12/18 1446  BP: 121/83  Pulse: (!) 104  There is no height or weight on file to calculate BMI.        Physical Examination:   General:  Alert, oriented and cooperative.   Mental Status: Normal  mood and affect perceived. Normal judgment and thought content.  Rest of physical exam deferred due to type of encounter  No results found for this or any previous visit (from the past 24 hour(s)).  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 Discussed giving TXA ppx  5) Dep/anx> increase lexapro to 20mg  d/t 3 recent family deaths/worsening depression  6) H/O GDM> early A1C normal   Meds:  Meds ordered this encounter  Medications  . escitalopram (LEXAPRO) 20 MG tablet    Sig: Take 1 tablet (20 mg total) by mouth daily.    Dispense:  30 tablet    Refill:  6    Order Specific Question:   Supervising Provider    Answer:   [name] [[id number]]    Labs/procedures today: none  Plan:  Continue routine obstetrical care. Has BP cuff. Check bp weekly, let us know if >140/90.   Reviewed: Preterm labor symptoms and general obstetric precautions including but not limited to vaginal bleeding, contractions, leaking of fluid and fetal movement were reviewed in detail with the patient. The patient was advised to call back or seek an in-person office evaluation/go to [facility] at [facility] for any urgent or concerning symptoms. All questions were answered. Please refer to After Visit Summary for other counseling recommendations.   I provided 15 minutes of non-face-to-face time during this encounter.  Follow-up:  Return for tomorrow am for fasting labs, then 6/5 or after for LROB in person, tdap, PN2.  Orders Placed This Encounter  Procedures  . Bile acids, total  . Comprehensive metabolic panel   Roma Schanz CNM, [facility] 09/12/2018 3:45 PM

## 2018-09-16 LAB — COMPREHENSIVE METABOLIC PANEL
ALT: 11 IU/L (ref 0–32)
AST: 8 IU/L (ref 0–40)
Albumin/Globulin Ratio: 1.4 (ref 1.2–2.2)
Albumin: 3.6 g/dL — ABNORMAL LOW (ref 3.9–5.0)
Alkaline Phosphatase: 115 IU/L (ref 39–117)
BUN/Creatinine Ratio: 15 (ref 9–23)
BUN: 7 mg/dL (ref 6–20)
Bilirubin Total: <0.2 mg/dL (ref 0.0–1.2)
CO2: 19 mmol/L — ABNORMAL LOW (ref 20–29)
Calcium: 8.9 mg/dL (ref 8.7–10.2)
Chloride: 106 mmol/L (ref 96–106)
Creatinine, Ser: 0.47 mg/dL — ABNORMAL LOW (ref 0.57–1.00)
GFR calc Af Amer: 164 mL/min/{1.73_m2} (ref >59)
GFR calc non Af Amer: 143 mL/min/{1.73_m2} (ref >59)
Globulin, Total: 2.5 g/dL (ref 1.5–4.5)
Glucose: 86 mg/dL (ref 65–99)
Potassium: 4.2 mmol/L (ref 3.5–5.2)
Sodium: 137 mmol/L (ref 134–144)
Total Protein: 6.1 g/dL (ref 6.0–8.5)

## 2018-09-16 LAB — BILE ACIDS, TOTAL: Bile Acids Total: 2.6 umol/L (ref 0.0–10.0)

## 2018-09-18 ENCOUNTER — Ambulatory Visit (HOSPITAL_COMMUNITY): Payer: Medicaid Other

## 2018-09-18 ENCOUNTER — Other Ambulatory Visit: Payer: Self-pay

## 2018-09-18 ENCOUNTER — Encounter (HOSPITAL_COMMUNITY): Payer: Self-pay

## 2018-09-18 DIAGNOSIS — M25551 Pain in right hip: Secondary | ICD-10-CM | POA: Diagnosis not present

## 2018-09-18 DIAGNOSIS — M545 Low back pain, unspecified: Secondary | ICD-10-CM

## 2018-09-18 DIAGNOSIS — R293 Abnormal posture: Secondary | ICD-10-CM

## 2018-09-18 NOTE — Therapy (Signed)
[place], [place], [address] Phone: [phone]   Fax:  [phone]  Physical Therapy Treatment  Patient Details  Name: Stacey Cook MRN: [id number] Date of Birth: [birth year] Referring Provider (PT): Christin Fudge, [place]   Encounter Date: 09/18/2018  PT End of Session - 09/18/18 1530    Visit Number  4    Number of Visits  12    Date for PT Re-Evaluation  09/11/18    Authorization Type  Medicaid [place] (requested 12 units on 08/21/18)    Authorization Time Period  08/21/18-10/02/18    Authorization - Visit Number  3    Authorization - Number of Visits  12    PT Start Time  1212    PT Stop Time  1250    PT Time Calculation (min)  38 min    Activity Tolerance  Patient tolerated treatment well    Behavior During Therapy  [facility] for tasks assessed/performed       Past Medical History:  Diagnosis Date  . Abdominal pain   . ADD (attention deficit disorder)   . Anemia   . Anginal pain ([place])   . Anxiety   . Arthritis    knees  . Asthma   . 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 Cervicalgia   . Cholestasis during pregnancy   . Chronic abdominal pain   . Complication of anesthesia    light headed  . Constipation   . Depression    not on meds  . Depression with anxiety 06/13/2017   06/13/17 rx zoloft 25mg  daily           06/06/18 w/ occ SI, rx Lexapro 10mg , [facility] for therapy  . Ear mass   . Gestational diabetes    pt states not been checking sugars regularly at home; nor has she been taking her Metformin  . Gestational diabetes   . Headache   . HSV infection   . Low iron   . PONV (postoperative nausea and vomiting)   . Suicidal intent   . UTI (lower urinary tract infection) 05/2014  . Vomiting     Past Surgical History:  Procedure Laterality Date  . CHOLECYSTECTOMY N/A 01/16/2018   Procedure: LAPAROSCOPIC CHOLECYSTECTOMY;  Surgeon: Aviva Signs, MD;  Location: [facility];  Service: General;  Laterality: N/A;  per Dr. Arnoldo Morale, pt will most likely go home and he will tell her to arrive at 10:45 - already has labs  . CHOLESTEATOMA EXCISION    . DILATION AND EVACUATION N/A 09/11/2016   Procedure: DILATATION AND CURRATAGE  2ND TRIMESTER;  Surgeon: Jonnie Kind, MD;  Location: [facility];  Service: Gynecology;  Laterality: N/A;  . IMPLANTATION BONE ANCHORED HEARING AID Right 04/2013  . INNER EAR SURGERY Right 03/31/2018  . 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 TYMPANOPLASTY Left   . TYMPANOSTOMY      There were no vitals filed for this visit.  Subjective Assessment - 09/18/18 1521    Subjective  Patient reports she has been experiencing contractions every hour that last about 1 minute. She states it is lower back labor pains and that it is very strong when she has them. She reports a constant 8/10 pain in her lower back.    pt is interetested in telehealth to continue services   Pertinent History  [redacted] weeks pregnant on 08/21/18; 1st birth ~9 months ago  Limitations  Sitting;Standing;Lifting;House hold activities    How long can you sit comfortably?  a couple minutes    How long can you stand comfortably?  several minutes    How long can you walk comfortably?  several minutes    Patient Stated Goals  to pick her son up without pain    Currently in Pain?  Yes    Pain Score  8     Pain Location  Back    Pain Orientation  Lower;Posterior    Pain Descriptors / Indicators  Aching    Pain Type  Chronic pain    Pain Onset  1 to 4 weeks ago    Pain Frequency  Constant       [facility] Adult PT Treatment/Exercise - 09/18/18 0001      Lumbar Exercises: Stretches   Other Lumbar Stretch Exercise  CHilds pose: 5 reps with 5-10 sec holds      Lumbar Exercises: Seated   Other Seated Lumbar Exercises  Pelvic tilt (ant/post) 15 reps 5 sec holds      Lumbar Exercises: Supine   Dead Bug  15 reps;Limitations    Dead Bug Limitations  1x 15    Other  Supine Lumbar Exercises  Hip Adduction isometric: 2x10 reps, 8 sec holds    Other Supine Lumbar Exercises  Hip Abduction isometric: 2x10 reps, 8 sec holds      Manual Therapy   Manual Therapy  Joint mobilization    Manual therapy comments  performed seperate from other interventions    Joint Mobilization  Lateral glide, grade III, bil Lumbar spine, 6 minutes each side       PT Education - 09/18/18 1528    Education Details  Educated on pregnancy belts that can be purchased over the counter. Educated patient that she can purchase it online or at a Management consultant. Provided email to link of belt that has been recommended and recieved excellent reviews for lower back pain related to pregnancy. Patient provided email and I sent it to her.     Person(s) Educated  Patient    Methods  Explanation    Comprehension  Verbalized understanding       PT Short Term Goals - 08/21/18 1535      PT SHORT TERM GOAL #1   Title  Patient will be independent with HEP, updated PRN, and demonstrate proper form with exercises.    Time  2    Period  Weeks    Status  New    Target Date  09/04/18      PT SHORT TERM GOAL #2   Title  Patient will be able to perform 10 squats to touch chair with no pain in order to improve squat mechanics to perform activities like laundry and caring for her son.     Time  3    Period  Weeks    Status  New    Target Date  09/11/18      PT SHORT TERM GOAL #3   Title  Patient will have no pain with lumbar flexion and achieve full AROM to demonstrate improved tolerance to aggravating postures.    Time  3    Period  Weeks    Status  New        PT Long Term Goals - 08/21/18 1538      PT LONG TERM GOAL #1   Title  Patient will demonstrate proper lifting mechanics to lift 10lbs off the floor to  chest height to dmeonstrate increased tolerance to loading lumbar spine to care for her son.     Time  6    Period  Weeks    Status  New    Target Date  10/02/18      PT LONG TERM  GOAL #2   Title  Patient will hav eno pain with MMT for Rt hip and achieve 5/5 strength throughout to equal Lt LE and indicate improve tolerance to loading her soft tissue.    Time  6    Period  Weeks    Status  New      PT LONG TERM GOAL #3   Title  Patient will report no pain increase in her Rt hip for 1 week while being able to pick up her son and carry him to care for his daily needs.     Time  6    Period  Weeks    Status  New       Plan - 09/18/18 1530    Clinical Impression Statement  Initiated session with manual intervention as patient reported 8/10 pain. Patient reported reduced pain with lateral glide to lumbar spine in sidelying and this was performed bilaterally. Patient continued with seated pelvic tilt and progressed stretching to child's pose for low back today. She required wide hip position and reported positive response to stretch. She demonstrated good form with "deadbug" exercise and was able to perform 15 reps continuously with no increase in pain. At EOS patient reported reduction to 6/10 pain. She was educated on pregnancy support belt options and provided a recommendation on a belt designed for low back pain. She will continue to benefit from skilled PT interventions to address impairments and improve posture and reduce pain.    Personal Factors and Comorbidities  Fitness;Social Background    Examination-Activity Limitations  Caring for Others;Bend;Carry;Lift;Sit;Squat    Examination-Participation Restrictions  Cleaning;Laundry;Community Activity    Stability/Clinical Decision Making  Stable/Uncomplicated    Rehab Potential  Fair    PT Frequency  2x / week    PT Duration  6 weeks    PT Treatment/Interventions  ADLs/Self Care Home Management;Cryotherapy;Electrical Stimulation;Moist Heat;Therapeutic activities;Therapeutic exercise;Functional mobility training;Balance training;Neuromuscular re-education;Patient/family education;Manual techniques;Passive range of  motion;Taping    PT Next Visit Plan  Continue stabilizaiton exercises and stretching for hips. Progress through squat and forward flexion motion with hip hinge focus to progress towards deadlift for strengthening. Continue manual PRN.    PT Home Exercise Plan  Eval: post pelvic tilt, post pelvic tilt with marching, figure four stretch, back extension at counter (repeated); hip abd/add iso, hip flexor stretch for Rt; 2018/09/26 - flutter kick, dead bug, seated pelvic tilt.    Consulted and Agree with Plan of Care  Patient       Patient will benefit from skilled therapeutic intervention in order to improve the following deficits and impairments:  Decreased activity tolerance, Decreased range of motion, Decreased strength, Decreased balance, Decreased mobility, Pain, Impaired perceived functional ability, Postural dysfunction, Obesity  Visit Diagnosis: Pain in right hip  Right-sided low back pain without sciatica, unspecified chronicity  Abnormal posture     Problem List Patient Active Problem List   Diagnosis Date Noted  . Ovarian cyst 07/04/2018  . Supervision of normal pregnancy 06/06/2018  . Short interval between pregnancies affecting pregnancy in first trimester, antepartum 05/19/2018  . 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  Gastroesophageal reflux disease   . Acute gallstone pancreatitis 01/12/2018  . Pancreatitis 01/12/2018  . History of shoulder dystocia in prior pregnancy 12/02/2017  . History of postpartum hemorrhage 12/02/2017  . History of cholestasis during pregnancy 09/21/2017  . History of gestational diabetes 08/24/2017  . Chest pain due to GERD 08/05/2017  . Depression with anxiety and suicidal ideations 06/13/2017  . History of miscarriage 01/18/2017  . Conductive hearing loss, unilat, unrestrict hearing contralateral side 12/10/2015  . Binge-eating disorder, in partial remission, moderate  03/21/2015  . Asthma, mild intermittent 02/18/2015  . 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 Perforation of left tympanic membrane 06/19/2014  . Cervicalgia 12/20/2013  . Abdominal pain, chronic, epigastric 08/08/2013  . ADHD (attention deficit hyperactivity disorder), combined type 08/01/2012  . ODD (oppositional defiant disorder) 08/01/2012  . Vasovagal syncope 02/28/2012  . Cholesteatoma of attic of right ear 02/15/2011    Kipp Brood, PT, DPT, [facility] Physical Therapist with [facility]  09/18/2018 3:31 PM    [place] [address] [place], [place], [address] Phone: [phone]   Fax:  [phone]  Name: Stacey Cook MRN: [id number] Date of Birth: 1996/02/01

## 2018-09-22 ENCOUNTER — Telehealth: Payer: Self-pay | Admitting: *Deleted

## 2018-09-22 ENCOUNTER — Encounter: Payer: Self-pay | Admitting: *Deleted

## 2018-09-22 ENCOUNTER — Telehealth: Payer: Self-pay | Admitting: Women's Health

## 2018-09-22 NOTE — Telephone Encounter (Signed)
Patient called stating that she is having contractions and is itching, burning and pressure down in her vaginal area. Please contact pt

## 2018-09-22 NOTE — Telephone Encounter (Signed)
VM not set up.

## 2018-09-25 ENCOUNTER — Ambulatory Visit (HOSPITAL_COMMUNITY): Payer: Medicaid Other

## 2018-09-25 NOTE — Telephone Encounter (Signed)
Patient called, stated that she's 24 weeks and she thinks she is having contractions.  She wants to come in because she doesn't want to go to the hospital as she said Tish recommended Friday.  Itching and burning has gotten worse.  [phone]

## 2018-10-03 ENCOUNTER — Telehealth (HOSPITAL_COMMUNITY): Payer: Self-pay

## 2018-10-03 ENCOUNTER — Ambulatory Visit (HOSPITAL_COMMUNITY): Payer: Medicaid Other

## 2018-10-03 NOTE — Telephone Encounter (Signed)
I called Ms. [name] and spoke with her regarding her appointment this afternoon. I initially told her we would need to cancel as her medicaid authorization has lapsed and we need to request more. I then noted it was her last scheduled visit and told her if she could make the appointment we would do a no charge and send the re-assessment in for additional visits if needed. She stated she could try to make her 1:00 pm appointment today but is going to be out getting her car and asked if there was a 2:30 or later appointment. I informed her there was not and stated that since she is going to be getting her car fixed and might not make the appointment that we should just cancel and I will submit a re-auth and reschedule her when approved. She said that would be fine for her and asked for afternoon appointments if available.  Kipp Brood, PT, DPT, [facility] Physical Therapist with [facility]  10/03/2018 11:50 AM

## 2018-10-12 ENCOUNTER — Encounter: Payer: Self-pay | Admitting: *Deleted

## 2018-10-13 ENCOUNTER — Other Ambulatory Visit: Payer: Medicaid Other

## 2018-10-13 ENCOUNTER — Encounter: Payer: Self-pay | Admitting: Obstetrics & Gynecology

## 2018-10-13 ENCOUNTER — Ambulatory Visit (INDEPENDENT_AMBULATORY_CARE_PROVIDER_SITE_OTHER): Payer: Medicaid Other | Admitting: Obstetrics & Gynecology

## 2018-10-13 ENCOUNTER — Other Ambulatory Visit: Payer: Self-pay

## 2018-10-13 VITALS — Wt 193.6 lb

## 2018-10-13 DIAGNOSIS — Z23 Encounter for immunization: Secondary | ICD-10-CM

## 2018-10-13 DIAGNOSIS — Z3A27 27 weeks gestation of pregnancy: Secondary | ICD-10-CM

## 2018-10-13 DIAGNOSIS — Z331 Pregnant state, incidental: Secondary | ICD-10-CM

## 2018-10-13 DIAGNOSIS — Z3482 Encounter for supervision of other normal pregnancy, second trimester: Secondary | ICD-10-CM | POA: Diagnosis not present

## 2018-10-13 DIAGNOSIS — Z1389 Encounter for screening for other disorder: Secondary | ICD-10-CM

## 2018-10-13 LAB — POCT URINALYSIS DIPSTICK OB
Blood, UA: NEGATIVE
Glucose, UA: NEGATIVE
Ketones, UA: NEGATIVE
Leukocytes, UA: NEGATIVE
Nitrite, UA: NEGATIVE

## 2018-10-13 NOTE — Progress Notes (Signed)
   LOW-RISK PREGNANCY VISIT Patient name: Stacey Cook MRN [id number]  Date of birth: 12-13-1995 Chief Complaint:   Routine Prenatal Visit (PN2/ having a lot pressure)  History of Present Illness:   Stacey Cook is a 23 y.o. G53P1011 female at [redacted]w[redacted]d with an Estimated Date of Delivery: 01/12/19 being seen today for ongoing management of a low-risk pregnancy.  Today she reports pelvic pressure . Contractions: Irregular.  .  Movement: Present. denies leaking of fluid. Review of Systems:   Pertinent items are noted in HPI Denies abnormal vaginal discharge w/ itching/odor/irritation, headaches, visual changes, shortness of breath, chest pain, abdominal pain, severe nausea/vomiting, or problems with urination or bowel movements unless otherwise stated above. Pertinent History Reviewed:  Reviewed past medical,surgical, social, obstetrical and family history.  Reviewed problem list, medications and allergies. Physical Assessment:   Vitals:   10/13/18 0952  Weight: 193 lb 9.6 oz (87.8 kg)  Body mass index is 39.1 kg/m.        Physical Examination:   General appearance: Well appearing, and in no distress  Mental status: Alert, oriented to person, place, and time  Skin: Warm & dry  Cardiovascular: Normal heart rate noted  Respiratory: Normal respiratory effort, no distress  Abdomen: Soft, gravid, nontender  Pelvic: Cervical exam deferred         Extremities: Edema: None  Fetal Status:     Movement: Present    Results for orders placed or performed in visit on 10/13/18 (from the past 24 hour(s))  POC Urinalysis Dipstick OB   Collection Time: 10/13/18 10:00 AM  Result Value Ref Range   Color, UA     Clarity, UA     Glucose, UA Negative Negative   Bilirubin, UA     Ketones, UA neg    Spec Grav, UA     Blood, UA neg    pH, UA     POC,PROTEIN,UA Trace Negative, Trace, Small (1+), Moderate (2+), Large (3+), 4+   Urobilinogen, UA     Nitrite, UA neg    Leukocytes, UA Negative Negative   Appearance     Odor      Assessment & Plan:  1) Low-risk pregnancy G3P1011 at [redacted]w[redacted]d with an Estimated Date of Delivery: 01/12/19   2) Hx of PPH   Meds: No orders of the defined types were placed in this encounter.  Labs/procedures today: PN2  Plan:  Continue routine obstetrical care   Reviewed: Preterm labor symptoms and general obstetric precautions including but not limited to vaginal bleeding, contractions, leaking of fluid and fetal movement were reviewed in detail with the patient.  All questions were answered  Follow-up: Return in about 3 weeks (around 11/03/2018) for webex, LROB.  Orders Placed This Encounter  Procedures  . Tdap vaccine greater than or equal to 7yo IM  . POC Urinalysis Dipstick OB   Mertie Clause Alzena Gerber  10/13/2018 10:26 AM

## 2018-10-14 LAB — CBC
Hematocrit: 33 % — ABNORMAL LOW (ref 34.0–46.6)
Hemoglobin: 11.2 g/dL (ref 11.1–15.9)
MCH: 29.2 pg (ref 26.6–33.0)
MCHC: 33.9 g/dL (ref 31.5–35.7)
MCV: 86 fL (ref 79–97)
Platelets: 197 10*3/uL (ref 150–450)
RBC: 3.83 x10E6/uL (ref 3.77–5.28)
RDW: 13.6 % (ref 11.7–15.4)
WBC: 11.4 10*3/uL — ABNORMAL HIGH (ref 3.4–10.8)

## 2018-10-14 LAB — GLUCOSE TOLERANCE, 2 HOURS W/ 1HR
Glucose, 1 hour: 139 mg/dL (ref 65–179)
Glucose, 2 hour: 109 mg/dL (ref 65–152)
Glucose, Fasting: 86 mg/dL (ref 65–91)

## 2018-10-14 LAB — RPR: RPR Ser Ql: NONREACTIVE

## 2018-10-14 LAB — ANTIBODY SCREEN: Antibody Screen: NEGATIVE

## 2018-10-14 LAB — HIV ANTIBODY (ROUTINE TESTING W REFLEX): HIV Screen 4th Generation wRfx: NONREACTIVE

## 2018-10-16 ENCOUNTER — Telehealth: Payer: Self-pay | Admitting: Obstetrics & Gynecology

## 2018-10-16 ENCOUNTER — Encounter: Payer: Self-pay | Admitting: *Deleted

## 2018-10-16 NOTE — Telephone Encounter (Signed)
Patient called, looking for PN2 results.  [phone]

## 2018-10-24 ENCOUNTER — Telehealth: Payer: Self-pay | Admitting: *Deleted

## 2018-10-24 ENCOUNTER — Other Ambulatory Visit: Payer: Self-pay

## 2018-10-24 ENCOUNTER — Encounter: Payer: Self-pay | Admitting: Women's Health

## 2018-10-24 ENCOUNTER — Ambulatory Visit (INDEPENDENT_AMBULATORY_CARE_PROVIDER_SITE_OTHER): Payer: Medicaid Other | Admitting: Women's Health

## 2018-10-24 VITALS — BP 133/74 | HR 107 | Wt 195.0 lb

## 2018-10-24 DIAGNOSIS — N898 Other specified noninflammatory disorders of vagina: Secondary | ICD-10-CM

## 2018-10-24 DIAGNOSIS — L299 Pruritus, unspecified: Secondary | ICD-10-CM

## 2018-10-24 DIAGNOSIS — Z3483 Encounter for supervision of other normal pregnancy, third trimester: Secondary | ICD-10-CM

## 2018-10-24 DIAGNOSIS — Z3A28 28 weeks gestation of pregnancy: Secondary | ICD-10-CM | POA: Diagnosis not present

## 2018-10-24 DIAGNOSIS — O26893 Other specified pregnancy related conditions, third trimester: Secondary | ICD-10-CM

## 2018-10-24 LAB — POCT URINALYSIS DIPSTICK OB
Blood, UA: NEGATIVE
Glucose, UA: NEGATIVE
Ketones, UA: NEGATIVE
Leukocytes, UA: NEGATIVE
Nitrite, UA: NEGATIVE

## 2018-10-24 LAB — POCT WET PREP (WET MOUNT)
Clue Cells Wet Prep Whiff POC: POSITIVE
Trichomonas Wet Prep HPF POC: ABSENT

## 2018-10-24 MED ORDER — METRONIDAZOLE 500 MG PO TABS: 500.0000 mg | ORAL_TABLET | Freq: Two times a day (BID) | ORAL | 0 refills | Status: DC

## 2018-10-24 MED ORDER — PANTOPRAZOLE SODIUM 20 MG PO TBEC: 20.0000 mg | DELAYED_RELEASE_TABLET | Freq: Every day | ORAL | 6 refills | Status: DC

## 2018-10-24 NOTE — Telephone Encounter (Signed)
Patient states she is having cramping along with back pain.  She has recently taken Monistat for what she thought was a yeast infection but does not think its better.  Has some spotting a few days ago but none recently.  Advised she will need a in person visit. Pt verbalized understanding and scheduled.

## 2018-10-24 NOTE — Progress Notes (Signed)
Work-in LOW-RISK PREGNANCY VISIT Patient name: Stacey Cook MRN [id number]  Date of birth: Mar 05, 1996 Chief Complaint:   work-in-ob (cramping/ discharge)  History of Present Illness:   Stacey Cook is a 23 y.o. G102P1011 female at [redacted]w[redacted]d with an Estimated Date of Delivery: 01/12/19 being seen today for ongoing management of a low-risk pregnancy.  Today she is being seen as work-in for report of constant burning LUQ pain, intermittent lower abd pain, vag d/c w/ itching/irritation. Tried otc monistat and some left over flagyl that didn't help. H/O ICP, has started itching again on belly and feet, worse at night. Cravings to smell Sharpies, gas, etc. No cravings to consume. Has been taking pepto pills for reflux.  Contractions: Irregular.  .  Movement: Present. denies leaking of fluid. Review of Systems:   Pertinent items are noted in HPI Denies abnormal vaginal discharge w/ itching/odor/irritation, headaches, visual changes, shortness of breath, chest pain, abdominal pain, severe nausea/vomiting, or problems with urination or bowel movements unless otherwise stated above. Pertinent History Reviewed:  Reviewed past medical,surgical, social, obstetrical and family history.  Reviewed problem list, medications and allergies. Physical Assessment:   Vitals:   10/24/18 1457  BP: 133/74  Pulse: (!) 107  Weight: 195 lb (88.5 kg)  Body mass index is 39.39 kg/m.        Physical Examination:   General appearance: Well appearing, and in no distress  Mental status: Alert, oriented to person, place, and time  Skin: Warm & dry  Cardiovascular: Normal heart rate noted  Respiratory: Normal respiratory effort, no distress  Abdomen: Soft, gravid, + tenderness LUQ  Pelvic: spec exam: cx visually closed/long, mod amt thin white malodorous d/c         Extremities: Edema: Trace  Fetal Status:     Movement: Present    Results for orders placed or performed in visit on 10/24/18 (from the past 24 hour(s))   POCT Wet Prep [place] [place])   Collection Time: 10/24/18  3:23 PM  Result Value Ref Range   Source Wet Prep POC vaginal    WBC, Wet Prep HPF POC mod    Bacteria Wet Prep HPF POC Few Few   BACTERIA WET PREP MORPHOLOGY POC     Clue Cells Wet Prep HPF POC Moderate (A) None   Clue Cells Wet Prep Whiff POC Positive Whiff    Yeast Wet Prep HPF POC None None   KOH Wet Prep POC     Trichomonas Wet Prep HPF POC Absent Absent  Results for orders placed or performed in visit on 08/15/18 (from the past 24 hour(s))  POC Urinalysis Dipstick OB   Collection Time: 10/24/18  3:01 PM  Result Value Ref Range   Color, UA     Clarity, UA     Glucose, UA Negative Negative   Bilirubin, UA     Ketones, UA neg    Spec Grav, UA     Blood, UA neg    pH, UA     POC,PROTEIN,UA Trace Negative, Trace, Small (1+), Moderate (2+), Large (3+), 4+   Urobilinogen, UA     Nitrite, UA neg    Leukocytes, UA Negative Negative   Appearance     Odor      Assessment & Plan:  1) Low-risk pregnancy G3P1011 at [redacted]w[redacted]d with an Estimated Date of Delivery: 01/12/19   2) BV> Rx metronidazole 500mg  BID x 7d for BV, no sex while taking   3) Reflux> stop pepto, rx protonix, see  if LUQ pain resolves  4) Itching, h/o ICP> come back for fasting labs in am  5) Cravings to smell Sharpies/gas, etc> Hgb 11.2 on 6/5   Meds:  Meds ordered this encounter  Medications  . metroNIDAZOLE (FLAGYL) 500 MG tablet    Sig: Take 1 tablet (500 mg total) by mouth 2 (two) times daily.    Dispense:  14 tablet    Refill:  0    Order Specific Question:   Supervising Provider    Answer:   Elonda Husky, LUTHER H [[id number]]  . pantoprazole (PROTONIX) 20 MG tablet    Sig: Take 1 tablet (20 mg total) by mouth daily.    Dispense:  30 tablet    Refill:  6    Order Specific Question:   Supervising Provider    Answer:   [name] [[id number]]   Labs/procedures today: spec exam, wet prep, gc/ct  Plan:  Continue routine obstetrical care   Reviewed: Preterm labor  symptoms and general obstetric precautions including but not limited to vaginal bleeding, contractions, leaking of fluid and fetal movement were reviewed in detail with the patient.  All questions were answered  Follow-up: Return for am for fasting labs; As scheduled 6/26 webex.  Orders Placed This Encounter  Procedures  . GC/Chlamydia Probe Amp  . Comprehensive metabolic panel  . Bile acids, total  . POCT Wet Prep ([address])   Roma Schanz CNM, [facility] 10/24/2018 3:24 PM

## 2018-10-24 NOTE — Patient Instructions (Signed)
[name], I greatly value your feedback.  If you receive a survey following your visit with [name] today, we appreciate you taking the time to fill it out.  Thanks, [name], CNM, [facility]  [place]!!! It is now [place] [facility] ([place], [place] [address]) Entrance located off of [address] [place] parking    Call the office [phone]) or go to [facility] if:  You begin to have strong, frequent contractions  Your water breaks.  Sometimes it is a big gush of fluid, sometimes it is just a trickle that keeps getting your panties wet or running down your legs  You have vaginal bleeding.  It is normal to have a small amount of spotting if your cervix was checked.   You don't feel your baby moving like normal.  If you don't, get you something to eat and drink and lay down and focus on feeling your baby move.  You should feel at least 10 movements in 2 hours.  If you don't, you should call the office or go to [place] and Birth Information  The normal length of a pregnancy is 39-41 weeks. Preterm labor is when labor starts before 37 completed weeks of pregnancy. What are the risk factors for preterm labor? Preterm labor is more likely to occur in women who:  Have certain infections during pregnancy such as a bladder infection, sexually transmitted infection, or infection inside the uterus (chorioamnionitis).  Have a shorter-than-normal cervix.  Have gone into preterm labor before.  Have had surgery on their cervix.  Are younger than age 85 or older than age 46.  Are African American.  Are pregnant with twins or multiple babies (multiple gestation).  Take street drugs or smoke while pregnant.  Do not gain enough weight while pregnant.  Became pregnant shortly after having been pregnant. What are the symptoms of preterm labor? Symptoms of preterm labor include:  Cramps similar  to those that can happen during a menstrual period. The cramps may happen with diarrhea.  Pain in the abdomen or lower back.  Regular uterine contractions that may feel like tightening of the abdomen.  A feeling of increased pressure in the pelvis.  Increased watery or bloody mucus discharge from the vagina.  Water breaking (ruptured amniotic sac). Why is it important to recognize signs of preterm labor? It is important to recognize signs of preterm labor because babies who are born prematurely may not be fully developed. This can put them at an increased risk for:  Long-term (chronic) heart and lung problems.  Difficulty immediately after birth with regulating body systems, including blood sugar, body temperature, heart rate, and breathing rate.  Bleeding in the brain.  Cerebral palsy.  Learning difficulties.  Death. These risks are highest for babies who are born before 35 weeks of pregnancy. How is preterm labor treated? Treatment depends on the length of your pregnancy, your condition, and the health of your baby. It may involve:  Having a stitch (suture) placed in your cervix to prevent your cervix from opening too early (cerclage).  Taking or being given medicines, such as: ? Hormone medicines. These may be given early in pregnancy to help support the pregnancy. ? Medicine to stop contractions. ? Medicines to help mature the baby's lungs. These may be prescribed if the risk of delivery is high. ? Medicines to prevent your baby from developing cerebral  palsy. If the labor happens before 34 weeks of pregnancy, you may need to stay in the hospital. What should I do if I think I am in preterm labor? If you think that you are going into preterm labor, call your health care provider right away. How can I prevent preterm labor in future pregnancies? To increase your chance of having a full-term pregnancy:  Do not use any tobacco products, such as cigarettes, chewing tobacco,  and e-cigarettes. If you need help quitting, ask your health care provider.  Do not use street drugs or medicines that have not been prescribed to you during your pregnancy.  Talk with your health care provider before taking any herbal supplements, even if you have been taking them regularly.  Make sure you gain a healthy amount of weight during your pregnancy.  Watch for infection. If you think that you might have an infection, get it checked right away.  Make sure to tell your health care provider if you have gone into preterm labor before. This information is not intended to replace advice given to you by your health care provider. Make sure you discuss any questions you have with your health care provider. Document Released: 07/17/2003 Document Revised: 10/07/2015 Document Reviewed: 09/17/2015 Elsevier Interactive Patient Education  2019 [employer].

## 2018-10-27 LAB — COMPREHENSIVE METABOLIC PANEL
ALT: 14 IU/L (ref 0–32)
AST: 13 IU/L (ref 0–40)
Albumin/Globulin Ratio: 1.3 (ref 1.2–2.2)
Albumin: 3.5 g/dL — ABNORMAL LOW (ref 3.9–5.0)
Alkaline Phosphatase: 140 IU/L — ABNORMAL HIGH (ref 39–117)
BUN/Creatinine Ratio: 10 (ref 9–23)
BUN: 5 mg/dL — ABNORMAL LOW (ref 6–20)
Bilirubin Total: <0.2 mg/dL (ref 0.0–1.2)
CO2: 18 mmol/L — ABNORMAL LOW (ref 20–29)
Calcium: 8.9 mg/dL (ref 8.7–10.2)
Chloride: 106 mmol/L (ref 96–106)
Creatinine, Ser: 0.49 mg/dL — ABNORMAL LOW (ref 0.57–1.00)
GFR calc Af Amer: 162 mL/min/{1.73_m2} (ref >59)
GFR calc non Af Amer: 141 mL/min/{1.73_m2} (ref >59)
Globulin, Total: 2.7 g/dL (ref 1.5–4.5)
Glucose: 88 mg/dL (ref 65–99)
Potassium: 4.1 mmol/L (ref 3.5–5.2)
Sodium: 138 mmol/L (ref 134–144)
Total Protein: 6.2 g/dL (ref 6.0–8.5)

## 2018-10-27 LAB — BILE ACIDS, TOTAL: Bile Acids Total: 4 umol/L (ref 0.0–10.0)

## 2018-10-28 LAB — GC/CHLAMYDIA PROBE AMP
Chlamydia trachomatis, NAA: NEGATIVE
Neisseria Gonorrhoeae by PCR: NEGATIVE

## 2018-10-29 ENCOUNTER — Inpatient Hospital Stay (HOSPITAL_COMMUNITY)
Admission: AD | Admit: 2018-10-29 | Discharge: 2018-10-29 | Disposition: A | Discharge status: Home / Self Care | Payer: Medicaid Other | Attending: Obstetrics & Gynecology | Admitting: Obstetrics & Gynecology

## 2018-10-29 ENCOUNTER — Encounter (HOSPITAL_COMMUNITY): Payer: Self-pay

## 2018-10-29 ENCOUNTER — Other Ambulatory Visit: Payer: Self-pay

## 2018-10-29 DIAGNOSIS — J45909 Unspecified asthma, uncomplicated: Secondary | ICD-10-CM | POA: Insufficient documentation

## 2018-10-29 DIAGNOSIS — O368130 Decreased fetal movements, third trimester, not applicable or unspecified: Secondary | ICD-10-CM | POA: Insufficient documentation

## 2018-10-29 DIAGNOSIS — O26893 Other specified pregnancy related conditions, third trimester: Secondary | ICD-10-CM | POA: Diagnosis not present

## 2018-10-29 DIAGNOSIS — Z87891 Personal history of nicotine dependence: Secondary | ICD-10-CM | POA: Diagnosis not present

## 2018-10-29 DIAGNOSIS — Z3A29 29 weeks gestation of pregnancy: Secondary | ICD-10-CM | POA: Diagnosis not present

## 2018-10-29 DIAGNOSIS — O99013 Anemia complicating pregnancy, third trimester: Secondary | ICD-10-CM | POA: Diagnosis not present

## 2018-10-29 DIAGNOSIS — O9989 Other specified diseases and conditions complicating pregnancy, childbirth and the puerperium: Secondary | ICD-10-CM | POA: Diagnosis not present

## 2018-10-29 DIAGNOSIS — Z3689 Encounter for other specified antenatal screening: Secondary | ICD-10-CM

## 2018-10-29 DIAGNOSIS — F419 Anxiety disorder, unspecified: Secondary | ICD-10-CM | POA: Insufficient documentation

## 2018-10-29 DIAGNOSIS — O99343 Other mental disorders complicating pregnancy, third trimester: Secondary | ICD-10-CM | POA: Diagnosis not present

## 2018-10-29 DIAGNOSIS — O99513 Diseases of the respiratory system complicating pregnancy, third trimester: Secondary | ICD-10-CM | POA: Insufficient documentation

## 2018-10-29 DIAGNOSIS — M549 Dorsalgia, unspecified: Secondary | ICD-10-CM | POA: Diagnosis not present

## 2018-10-29 DIAGNOSIS — D649 Anemia, unspecified: Secondary | ICD-10-CM | POA: Diagnosis not present

## 2018-10-29 DIAGNOSIS — Z79899 Other long term (current) drug therapy: Secondary | ICD-10-CM | POA: Insufficient documentation

## 2018-10-29 LAB — URINALYSIS, ROUTINE W REFLEX MICROSCOPIC
Bilirubin Urine: NEGATIVE
Glucose, UA: NEGATIVE mg/dL
Hgb urine dipstick: NEGATIVE
Ketones, ur: NEGATIVE mg/dL
Nitrite: NEGATIVE
Protein, ur: NEGATIVE mg/dL
Specific Gravity, Urine: 1.015 (ref 1.005–1.030)
pH: 6 (ref 5.0–8.0)

## 2018-10-29 MED ORDER — ACETAMINOPHEN 500 MG PO TABS
1000.0000 mg | ORAL_TABLET | Freq: Once | ORAL | Status: AC
  Administered 2018-10-29: 1000 mg via ORAL
  Filled 2018-10-29 (×2): qty 2

## 2018-10-29 MED ORDER — CYCLOBENZAPRINE HCL 10 MG PO TABS: 10.0000 mg | ORAL_TABLET | Freq: Three times a day (TID) | ORAL | 0 refills | Status: DC | PRN

## 2018-10-29 MED ORDER — CYCLOBENZAPRINE HCL 10 MG PO TABS
10.0000 mg | ORAL_TABLET | Freq: Once | ORAL | Status: AC
  Administered 2018-10-29: 20:00:00 10 mg via ORAL
  Filled 2018-10-29: qty 1

## 2018-10-29 NOTE — MAU Note (Signed)
Stacey Cook is a 23 y.o. at [redacted]w[redacted]d here in MAU reporting: been having [place] for a week, around midnight she started having more painful contractions in her back. She had 3 that were 5 minutes apart and states the doctors told her when they were that close she needed to be check. Also having left mid back pain around her ribs. No bleeding or LOF. States she has felt some FM today but less then normal.  Onset of complaint: midnight it got worse  Pain score: contraction in back 9/10, left mid back pain 9/10  Vitals:   10/29/18 1847  BP: 118/79  Pulse: 100  Resp: 18  Temp: 98 F (36.7 C)  SpO2: 99%     FHT:137  Lab orders placed from triage: UA

## 2018-10-29 NOTE — Discharge Instructions (Signed)
Continue flexeril and tylenol as needed. Continue ice packs to your back for 15 minutes twice a day. Expect your baby to move every day. Use stretching exercises to stretch your back. Call the office if your symtoms are worsening. Finish the Metronidazole that you are taking for BV. Drink at least 8 8-oz glasses of water every day.

## 2018-10-29 NOTE — MAU Provider Note (Signed)
History     CSN: [id number]  Arrival date and time: 10/29/18 5726   First Provider Initiated Contact with Patient 10/29/18 1945      Chief Complaint  Patient presents with  . Back Pain  . Decreased Fetal Movement   HPI Stacey Cook 23 y.o. [redacted]w[redacted]d  Comes to MAU with back pain that comes and goes that she thinks is contractions.  Is not feeling the baby move well today.  Concerned she is in labor.  Is currently on day 3 of treatment for BV.  Is breathing hard periodically.  Took Tylenol last at 4 am today and it did not help.  Has tried ice packs to her back and a heating pad - both today and they did not help.  Had runny stools twice this morning.  Provider in the room with client breathing hard twice and unable to have the monitor trace a contraction.  Felt very mild tightening in the lower abdomen.  Has eaten today.  Yesterday she had a party for her one year old's birthday and was walking around a lot.  Has had flexeril in the past for back pain but stopped taking it.  Client is seen at [facility] for [facility].  History is remarkable for hearing impairment with cochlear implant, left ovarian cyst this pregnancy 6.5 cm, HX cholestasis, HX GDM, HX of PPH and HX of shoulder dystocia.  OB History    Gravida  3   Para  1   Term  1   Preterm  0   AB  1   Living  1     SAB  1   TAB  0   Ectopic  0   Multiple  0   Live Births  1           Past Medical History:  Diagnosis Date  . Abdominal pain   . ADD (attention deficit disorder)   . Anemia   . Anginal pain ([place])   . Anxiety   . Arthritis    knees  . Asthma   . 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 Cervicalgia   . Cholestasis during pregnancy   . Chronic abdominal pain   . Complication of anesthesia    light headed  . Constipation   . Depression    not on meds  . Depression with anxiety 06/13/2017   06/13/17 rx zoloft 25mg  daily           06/06/18 w/ occ SI, rx Lexapro  10mg , [facility] for therapy  . Ear mass   . Gestational diabetes    pt states not been checking sugars regularly at home; nor has she been taking her Metformin  . Gestational diabetes   . Headache   . HSV infection   . Low iron   . PONV (postoperative nausea and vomiting)   . Suicidal intent   . UTI (lower urinary tract infection) 05/2014  . Vomiting     Past Surgical History:  Procedure Laterality Date  . CHOLECYSTECTOMY N/A 01/16/2018   Procedure: LAPAROSCOPIC CHOLECYSTECTOMY;  Surgeon: Aviva Signs, MD;  Location: [facility];  Service: General;  Laterality: N/A;  per Dr. Arnoldo Morale, pt will most likely go home and he will tell her to arrive at 10:45 - already has labs  . CHOLESTEATOMA EXCISION    . DILATION AND EVACUATION N/A 09/11/2016   Procedure: DILATATION AND CURRATAGE  2ND TRIMESTER;  Surgeon: Jonnie Kind, MD;  Location:  [facility];  Service: Gynecology;  Laterality: N/A;  . IMPLANTATION BONE ANCHORED HEARING AID Right 04/2013  . INNER EAR SURGERY Right 03/31/2018  . 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 TYMPANOPLASTY Left   . TYMPANOSTOMY      Family History  Problem Relation Age of Onset  . Cholelithiasis Mother   . Kidney disease Mother        stones  . Depression Mother   . Hypertension Maternal Grandmother   . Diabetes Maternal Grandmother   . Stroke Maternal Grandmother   . Seizures Maternal Grandmother   . Asthma Maternal Grandmother   . Hyperlipidemia Maternal Grandmother   . Thyroid disease Maternal Grandmother   . Asthma Brother   . Seizures Maternal Uncle   . Cancer Other        breast- great aunt  . Celiac disease Neg Hx     Social History   Tobacco Use  . Smoking status: Former Smoker    Packs/day: 0.50    Years: 1.00    Pack years: 0.50    Types: Cigarettes    Quit date: 03/11/2017    Years since quitting: 1.6  . Smokeless tobacco: Never Used  Substance Use Topics  . Alcohol use: No    Alcohol/week: 0.0 standard drinks  .  Drug use: No    Allergies:  Allergies  Allergen Reactions  . Fentanyl Nausea And Vomiting  . Keflex [Cephalexin] Other (See Comments)    Reaction:  Elevated liver enzymes   . Other Other (See Comments)    Pt is allergic to surgical glue swelling;  Cats-congestion, sneezing, eyes watery, itching all over Reaction:  Infection   . Adhesive [Tape] Rash    Medications Prior to Admission  Medication Sig Dispense Refill Last Dose  . acetaminophen (TYLENOL) 500 MG tablet Take 500 mg by mouth as needed.     . Blood Pressure Monitoring (BLOOD PRESSURE MONITOR AUTOMAT) DEVI Take BP at home per provider instructions 1 Device 0   . cyclobenzaprine (FLEXERIL) 10 MG tablet Take 1 tablet (10 mg total) by mouth 2 (two) times daily as needed for muscle spasms. (Patient not taking: Reported on 10/24/2018) 20 tablet 0   . escitalopram (LEXAPRO) 20 MG tablet Take 1 tablet (20 mg total) by mouth daily. 30 tablet 6   . metroNIDAZOLE (FLAGYL) 500 MG tablet Take 1 tablet (500 mg total) by mouth 2 (two) times daily. 14 tablet 0   . pantoprazole (PROTONIX) 20 MG tablet Take 1 tablet (20 mg total) by mouth daily. 30 tablet 6   . prenatal vitamin w/FE, FA (PRENATAL 1 + 1) 27-1 MG TABS tablet Take 1 tablet by mouth daily at 12 noon. 30 each 12   . promethazine (PHENERGAN) 25 MG tablet Take 1 tablet (25 mg total) by mouth every 6 (six) hours as needed for nausea or vomiting. (Patient not taking: Reported on 10/24/2018) 30 tablet 1     Review of Systems  Constitutional: Negative for fever.  Respiratory: Negative for cough and shortness of breath.   Gastrointestinal: Positive for abdominal pain. Negative for nausea and vomiting.  Genitourinary: Negative for dysuria, vaginal bleeding and vaginal discharge.  Musculoskeletal: Positive for back pain.   Physical Exam   Blood pressure (!) 112/55, pulse (!) 101, temperature 98 F (36.7 C), temperature source Oral, resp. rate 18, height 4\' 11"  (1.499 m), weight 88.9 kg,  last menstrual period 03/29/2018, SpO2 99 %, not currently breastfeeding.  Physical Exam  Nursing note and vitals reviewed. Constitutional: She is oriented to person, place, and time. She appears well-developed and well-nourished.  HENT:  Head: Normocephalic.  Eyes: EOM are normal.  Neck: Neck supple.  GI: Soft. There is no abdominal tenderness. There is no rebound and no guarding.  FHT baseline is 150 with moderate variability.  Client is feeling the baby move now.  Accelerations of 15x15 noted.  Reactive NST.  No contractions.  A few variable decels noted and returned quickly to to baseline.  Genitourinary:    Genitourinary Comments: Speculum exam: Vagina - Small amount of white discharge, no odor Cervix - No contact bleeding Bimanual exam: Cervix internal os closed, thick, presenting part is not in the pelvis Chaperone present for exam.    Musculoskeletal: Normal range of motion.     Comments: Most of the back pain is all across the low back.  Does have some pain in the left flank.  Left lower back pain> right lower back pain.  Neurological: She is alert and oriented to person, place, and time.  Skin: Skin is warm and dry.  Psychiatric: She has a normal mood and affect.    MAU Course  Procedures Results for orders placed or performed during the hospital encounter of 10/29/18 (from the past 24 hour(s))  Urinalysis, Routine w reflex microscopic     Status: Abnormal   Collection Time: 10/29/18  6:54 PM  Result Value Ref Range   Color, Urine YELLOW YELLOW   APPearance CLOUDY (A) CLEAR   Specific Gravity, Urine 1.015 1.005 - 1.030   pH 6.0 5.0 - 8.0   Glucose, UA NEGATIVE NEGATIVE mg/dL   Hgb urine dipstick NEGATIVE NEGATIVE   Bilirubin Urine NEGATIVE NEGATIVE   Ketones, ur NEGATIVE NEGATIVE mg/dL   Protein, ur NEGATIVE NEGATIVE mg/dL   Nitrite NEGATIVE NEGATIVE   Leukocytes,Ua MODERATE (A) NEGATIVE   RBC / HPF 0-5 0 - 5 RBC/hpf   WBC, UA 11-20 0 - 5 WBC/hpf   Bacteria, UA  FEW (A) NONE SEEN   Squamous Epithelial / LPF 11-20 0 - 5   Mucus PRESENT    Amorphous Crystal PRESENT     MDM Is breathing like labor and almost ready to push, but no contractions are being palpated and none are tracing on the monitor.  Will give Flexeril and redose Tylenol since she has not had any since 4 am.  No cervical change - not in labor.  Of note, ovarian cyst may be contributing to the pain.  Urine culture pending to make sure no urinary problem. Client got Tylenol 1000cc and Flexeril 10 mg in MAU.  Afterwards pain went for 9/10 to 8/10 but client is drowsy and talking on the phone - no longer breathing heavily with any pain.  Reassured and advised to keep all appointments at the office.  Assessment and Plan  Back pain in pregnancy Decreased fetal movement that is now resolved. Reactive NST.  Plan Continue flexeril and tylenol as needed. Continue ice packs to your back for 15 minutes twice a day. Use stretching exercises to stretch your back. Call the office if your symtoms are worsening. Finish the Metronidazole that you are taking for BV. Drink at least 8 8-oz glasses of water every day.    Narayan Scull L Lorisa Scheid 10/29/2018, 8:11 PM

## 2018-10-30 ENCOUNTER — Telehealth: Payer: Self-pay | Admitting: Women's Health

## 2018-10-30 NOTE — Telephone Encounter (Signed)
Pt calling to discuss her lab results.

## 2018-10-30 NOTE — Telephone Encounter (Signed)
Pt aware bile acids was within normal range. Pt voiced understanding. [place]

## 2018-10-31 ENCOUNTER — Encounter: Payer: Self-pay | Admitting: Women's Health

## 2018-10-31 DIAGNOSIS — R8271 Bacteriuria: Secondary | ICD-10-CM | POA: Insufficient documentation

## 2018-10-31 LAB — CULTURE, OB URINE

## 2018-11-01 ENCOUNTER — Encounter: Payer: Self-pay | Admitting: Student

## 2018-11-02 NOTE — Progress Notes (Signed)
This encounter was created in error - please disregard.

## 2018-11-03 ENCOUNTER — Other Ambulatory Visit: Payer: Self-pay

## 2018-11-03 ENCOUNTER — Ambulatory Visit: Payer: Medicaid Other | Admitting: *Deleted

## 2018-11-03 ENCOUNTER — Encounter: Payer: Self-pay | Admitting: *Deleted

## 2018-11-03 ENCOUNTER — Other Ambulatory Visit: Payer: Medicaid Other

## 2018-11-03 ENCOUNTER — Ambulatory Visit (INDEPENDENT_AMBULATORY_CARE_PROVIDER_SITE_OTHER): Payer: Medicaid Other | Admitting: Women's Health

## 2018-11-03 ENCOUNTER — Encounter: Payer: Self-pay | Admitting: Women's Health

## 2018-11-03 VITALS — BP 106/73 | HR 101 | Ht 59.0 in | Wt 196.5 lb

## 2018-11-03 VITALS — BP 142/86 | HR 108 | Wt 196.0 lb

## 2018-11-03 DIAGNOSIS — Z3A30 30 weeks gestation of pregnancy: Secondary | ICD-10-CM

## 2018-11-03 DIAGNOSIS — Z3483 Encounter for supervision of other normal pregnancy, third trimester: Secondary | ICD-10-CM

## 2018-11-03 DIAGNOSIS — Z0130 Encounter for examination of blood pressure without abnormal findings: Secondary | ICD-10-CM

## 2018-11-03 DIAGNOSIS — R030 Elevated blood-pressure reading, without diagnosis of hypertension: Secondary | ICD-10-CM

## 2018-11-03 DIAGNOSIS — O26893 Other specified pregnancy related conditions, third trimester: Secondary | ICD-10-CM

## 2018-11-03 DIAGNOSIS — L299 Pruritus, unspecified: Secondary | ICD-10-CM

## 2018-11-03 NOTE — Progress Notes (Signed)
TELEHEALTH VIRTUAL OBSTETRICS VISIT ENCOUNTER NOTE Patient name: Stacey Cook MRN [id number]  Date of birth: 1995-06-07  I connected with patient on 11/03/18 at 10:00 AM EDT by [facility] and verified that I am speaking with the correct person using two identifiers. Due to COVID-19 recommendations, pt is not currently in our office.    I discussed the limitations, risks, security and privacy concerns of performing an evaluation and management service by telephone and the availability of in person appointments. I also discussed with the patient that there may be a patient responsible charge related to this service. The patient expressed understanding and agreed to proceed.  Chief Complaint:   Routine Prenatal Visit  History of Present Illness:   Stacey Cook is a 23 y.o. G72P1011 female at [redacted]w[redacted]d with an Estimated Date of Delivery: 01/12/19 being evaluated today for ongoing management of a low-risk pregnancy.  Today she reports some headaches, sees spots when nauseated/needs to eat, some RUQ pain that radiates to Lt side- this has been going on for over a month, no n/v. BPs at home labile. Still itching, worse at night. H/O ICP, last bile acids on 6/17 normal.  Contractions: Irregular.  .  Movement: Present. denies leaking of fluid. Review of Systems:   Pertinent items are noted in HPI Denies abnormal vaginal discharge w/ itching/odor/irritation, headaches, visual changes, shortness of breath, chest pain, abdominal pain, severe nausea/vomiting, or problems with urination or bowel movements unless otherwise stated above. Pertinent History Reviewed:  Reviewed past medical,surgical, social, obstetrical and family history.  Reviewed problem list, medications and allergies. Physical Assessment:   Vitals:   11/03/18 1032 11/03/18 1056  BP: (!) 142/71 (!) 142/86  Pulse: (!) 108   Weight: 196 lb (88.9 kg)   Body mass index is 39.59 kg/m.        Physical Examination:   General:  Alert, oriented and  cooperative.   Mental Status: Normal mood and affect perceived. Normal judgment and thought content.  Rest of physical exam deferred due to type of encounter  No results found for this or any previous visit (from the past 24 hour(s)).  Assessment & Plan:  1) Pregnancy G3P1011 at [redacted]w[redacted]d with an Estimated Date of Delivery: 01/12/19   2) Elevated bp at home, come today for pre-e labs, then bp check w/ nurse, then again Monday for bp check w/ nurse. Reviewed pre-e s/s, reasons to seek care  3) Itching, h/o ICP> recheck bile acids today  4) H/O SD> EFW @ 36wks  5) H/O PPH  6) H/O GDM> normal GTT this pregnancy   Meds: No orders of the defined types were placed in this encounter.   Labs/procedures today: pre-e labs, bile acids  Plan:  Continue routine obstetrical care.  Has home bp cuff.  Check bp QID, let us know if >140/90.   Reviewed: Preterm labor symptoms and general obstetric precautions including but not limited to vaginal bleeding, contractions, leaking of fluid and fetal movement were reviewed in detail with the patient. The patient was advised to call back or seek an in-person office evaluation/go to MAU at [facility] for any urgent or concerning symptoms. All questions were answered. Please refer to After Visit Summary for other counseling recommendations.    I provided 15 minutes of non-face-to-face time during this encounter.  Follow-up: Return for come today for labs & bp check, then Monday for bp check.  Orders Placed This Encounter  Procedures  . CBC  . Comprehensive metabolic  panel  . Protein / creatinine ratio, urine  . Bile acids, total   Roma Schanz CNM, [facility] 11/03/2018 11:02 AM

## 2018-11-03 NOTE — Progress Notes (Signed)
Pt here for BP check. BP today was 106/73. I advised that was good. Manus Rudd Advised to have pt bring her BP monitor with her on Monday for another BP check. Pt voiced understanding. [place]

## 2018-11-03 NOTE — Patient Instructions (Signed)
Stacey Cook, I greatly value your feedback.  If you receive a survey following your visit with [name] today, we appreciate you taking the time to fill it out.  Thanks, Stacey Cook, CNM, [facility]  [place]!!! It is now [place] [facility] ([place], [place] [address]) Entrance located off of [address] parking    Call the office [phone]) or go to [facility] if:  You begin to have strong, frequent contractions  Your water breaks.  Sometimes it is a big gush of fluid, sometimes it is just a trickle that keeps getting your panties wet or running down your legs  You have vaginal bleeding.  It is normal to have a small amount of spotting if your cervix was checked.   You don't feel your baby moving like normal.  If you don't, get you something to eat and drink and lay down and focus on feeling your baby move.  You should feel at least 10 movements in 2 hours.  If you don't, you should call the office or go to [place] [facility]   Check your blood pressure 4 times daily and keep a log, bring this with you to all appointments.  If blood pressure is >=160 on top or >=110 on bottom, check again in 5 minutes, if still this high, call us or go to [facility] to be evaluated   Helpful Tips for Accurate Home Blood Pressure Checks  . Don't smoke, exercise, or drink caffeine 30 minutes before checking your BP . Use the restroom before checking your BP (a full bladder can raise your pressure) . Relax in a comfortable upright chair . Feet on the ground . Left arm resting comfortably on a flat surface at the level of your heart . Legs uncrossed . Back supported . Sit quietly and don't talk . Place the cuff on your bare arm . Adjust snuggly, so that only two fingertips can fit between your skin and the top of the cuff . Check 2 readings separated by at least one minute . Keep a  log of your BP readings . For a visual, please reference this diagram: http://ccnc.care/bpdiagram  Provider Name: [facility]     Phone: [phone]  Zone 1: ALL CLEAR  Continue to monitor your symptoms:  . BP reading is less than 140 (top number) or less than 90 (bottom number)  . No right upper stomach pain . No headaches or seeing spots . No feeling nauseated or throwing up . No swelling in face and hands  Zone 2: CAUTION Call your doctor's office for any of the following:  . BP reading is greater than 140 (top number) or greater than 90 (bottom number)  . Stomach pain under your ribs in the middle or right side . Headaches or seeing spots . Feeling nauseated or throwing up . Swelling in face and hands  Zone 3: EMERGENCY  Seek immediate medical care if you have any of the following:  . BP reading is greater than160 (top number) or greater than 110 (bottom number) . Severe headaches not improving with Tylenol . Serious difficulty catching your breath . Any worsening symptoms from Zone 2    Preterm Labor and Birth Information  The normal length of a pregnancy is 39-41 weeks. Preterm labor is when labor starts before 37 completed weeks of pregnancy. What are the risk factors  for preterm labor? Preterm labor is more likely to occur in women who:  Have certain infections during pregnancy such as a bladder infection, sexually transmitted infection, or infection inside the uterus (chorioamnionitis).  Have a shorter-than-normal cervix.  Have gone into preterm labor before.  Have had surgery on their cervix.  Are younger than age 48 or older than age 49.  Are African American.  Are pregnant with twins or multiple babies (multiple gestation).  Take street drugs or smoke while pregnant.  Do not gain enough weight while pregnant.  Became pregnant shortly after having been pregnant. What are the symptoms of preterm labor? Symptoms of preterm labor include:   Cramps similar to those that can happen during a menstrual period. The cramps may happen with diarrhea.  Pain in the abdomen or lower back.  Regular uterine contractions that may feel like tightening of the abdomen.  A feeling of increased pressure in the pelvis.  Increased watery or bloody mucus discharge from the vagina.  Water breaking (ruptured amniotic sac). Why is it important to recognize signs of preterm labor? It is important to recognize signs of preterm labor because babies who are born prematurely may not be fully developed. This can put them at an increased risk for:  Long-term (chronic) heart and lung problems.  Difficulty immediately after birth with regulating body systems, including blood sugar, body temperature, heart rate, and breathing rate.  Bleeding in the brain.  Cerebral palsy.  Learning difficulties.  Death. These risks are highest for babies who are born before 76 weeks of pregnancy. How is preterm labor treated? Treatment depends on the length of your pregnancy, your condition, and the health of your baby. It may involve:  Having a stitch (suture) placed in your cervix to prevent your cervix from opening too early (cerclage).  Taking or being given medicines, such as: ? Hormone medicines. These may be given early in pregnancy to help support the pregnancy. ? Medicine to stop contractions. ? Medicines to help mature the baby's lungs. These may be prescribed if the risk of delivery is high. ? Medicines to prevent your baby from developing cerebral palsy. If the labor happens before 34 weeks of pregnancy, you may need to stay in the hospital. What should I do if I think I am in preterm labor? If you think that you are going into preterm labor, call your health care provider right away. How can I prevent preterm labor in future pregnancies? To increase your chance of having a full-term pregnancy:  Do not use any tobacco products, such as cigarettes,  chewing tobacco, and e-cigarettes. If you need help quitting, ask your health care provider.  Do not use street drugs or medicines that have not been prescribed to you during your pregnancy.  Talk with your health care provider before taking any herbal supplements, even if you have been taking them regularly.  Make sure you gain a healthy amount of weight during your pregnancy.  Watch for infection. If you think that you might have an infection, get it checked right away.  Make sure to tell your health care provider if you have gone into preterm labor before. This information is not intended to replace advice given to you by your health care provider. Make sure you discuss any questions you have with your health care provider. Document Released: 07/17/2003 Document Revised: 10/07/2015 Document Reviewed: 09/17/2015 Elsevier Interactive Patient Education  2019 [employer].

## 2018-11-06 ENCOUNTER — Other Ambulatory Visit: Payer: Self-pay

## 2018-11-06 ENCOUNTER — Ambulatory Visit (INDEPENDENT_AMBULATORY_CARE_PROVIDER_SITE_OTHER): Payer: Medicaid Other | Admitting: *Deleted

## 2018-11-06 VITALS — BP 119/73 | HR 119 | Wt 201.0 lb

## 2018-11-06 DIAGNOSIS — Z331 Pregnant state, incidental: Secondary | ICD-10-CM

## 2018-11-06 DIAGNOSIS — Z3483 Encounter for supervision of other normal pregnancy, third trimester: Secondary | ICD-10-CM

## 2018-11-06 DIAGNOSIS — Z1389 Encounter for screening for other disorder: Secondary | ICD-10-CM

## 2018-11-06 LAB — CBC
Hematocrit: 31.3 % — ABNORMAL LOW (ref 34.0–46.6)
Hemoglobin: 10.9 g/dL — ABNORMAL LOW (ref 11.1–15.9)
MCH: 28.5 pg (ref 26.6–33.0)
MCHC: 34.8 g/dL (ref 31.5–35.7)
MCV: 82 fL (ref 79–97)
Platelets: 188 10*3/uL (ref 150–450)
RBC: 3.82 x10E6/uL (ref 3.77–5.28)
RDW: 13.9 % (ref 11.7–15.4)
WBC: 12.7 10*3/uL — ABNORMAL HIGH (ref 3.4–10.8)

## 2018-11-06 LAB — POCT URINALYSIS DIPSTICK OB
Blood, UA: NEGATIVE
Glucose, UA: NEGATIVE
Ketones, UA: NEGATIVE
Leukocytes, UA: NEGATIVE
Nitrite, UA: NEGATIVE

## 2018-11-06 LAB — COMPREHENSIVE METABOLIC PANEL
ALT: 10 IU/L (ref 0–32)
AST: 14 IU/L (ref 0–40)
Albumin/Globulin Ratio: 1.3 (ref 1.2–2.2)
Albumin: 3.7 g/dL — ABNORMAL LOW (ref 3.9–5.0)
Alkaline Phosphatase: 152 IU/L — ABNORMAL HIGH (ref 39–117)
BUN/Creatinine Ratio: 15 (ref 9–23)
BUN: 6 mg/dL (ref 6–20)
Bilirubin Total: <0.2 mg/dL (ref 0.0–1.2)
CO2: 17 mmol/L — ABNORMAL LOW (ref 20–29)
Calcium: 9 mg/dL (ref 8.7–10.2)
Chloride: 102 mmol/L (ref 96–106)
Creatinine, Ser: 0.4 mg/dL — ABNORMAL LOW (ref 0.57–1.00)
GFR calc Af Amer: 173 mL/min/{1.73_m2} (ref >59)
GFR calc non Af Amer: 150 mL/min/{1.73_m2} (ref >59)
Globulin, Total: 2.8 g/dL (ref 1.5–4.5)
Glucose: 82 mg/dL (ref 65–99)
Potassium: 4.3 mmol/L (ref 3.5–5.2)
Sodium: 134 mmol/L (ref 134–144)
Total Protein: 6.5 g/dL (ref 6.0–8.5)

## 2018-11-06 LAB — PROTEIN / CREATININE RATIO, URINE
Creatinine, Urine: 92.9 mg/dL
Protein, Ur: 22.2 mg/dL
Protein/Creat Ratio: 239 mg/g creat — ABNORMAL HIGH (ref 0–200)

## 2018-11-06 LAB — BILE ACIDS, TOTAL: Bile Acids Total: 4.6 umol/L (ref 0.0–10.0)

## 2018-11-06 NOTE — Progress Notes (Signed)
BP reviewed with Dr Elonda Husky.  Pt to return in 2 weeks.

## 2018-11-09 ENCOUNTER — Other Ambulatory Visit: Payer: Self-pay | Admitting: Women's Health

## 2018-11-09 MED ORDER — FERROUS SULFATE 325 (65 FE) MG PO TABS: 325.0000 mg | ORAL_TABLET | Freq: Two times a day (BID) | ORAL | 3 refills | Status: DC

## 2018-11-19 ENCOUNTER — Encounter (HOSPITAL_COMMUNITY): Payer: Self-pay | Admitting: *Deleted

## 2018-11-19 ENCOUNTER — Inpatient Hospital Stay (HOSPITAL_BASED_OUTPATIENT_CLINIC_OR_DEPARTMENT_OTHER): Payer: Medicaid Other

## 2018-11-19 ENCOUNTER — Other Ambulatory Visit: Payer: Self-pay

## 2018-11-19 ENCOUNTER — Inpatient Hospital Stay (HOSPITAL_COMMUNITY)
Admission: AD | Admit: 2018-11-19 | Discharge: 2018-11-19 | Disposition: A | Discharge status: Home / Self Care | Payer: Medicaid Other | Attending: Obstetrics & Gynecology | Admitting: Obstetrics & Gynecology

## 2018-11-19 DIAGNOSIS — O9A213 Injury, poisoning and certain other consequences of external causes complicating pregnancy, third trimester: Secondary | ICD-10-CM

## 2018-11-19 DIAGNOSIS — Z3689 Encounter for other specified antenatal screening: Secondary | ICD-10-CM

## 2018-11-19 DIAGNOSIS — W19XXXA Unspecified fall, initial encounter: Secondary | ICD-10-CM

## 2018-11-19 DIAGNOSIS — M25519 Pain in unspecified shoulder: Secondary | ICD-10-CM | POA: Insufficient documentation

## 2018-11-19 DIAGNOSIS — Z87891 Personal history of nicotine dependence: Secondary | ICD-10-CM | POA: Insufficient documentation

## 2018-11-19 DIAGNOSIS — W010XXA Fall on same level from slipping, tripping and stumbling without subsequent striking against object, initial encounter: Secondary | ICD-10-CM | POA: Diagnosis not present

## 2018-11-19 DIAGNOSIS — Z3A32 32 weeks gestation of pregnancy: Secondary | ICD-10-CM | POA: Diagnosis not present

## 2018-11-19 DIAGNOSIS — O26893 Other specified pregnancy related conditions, third trimester: Secondary | ICD-10-CM | POA: Diagnosis not present

## 2018-11-19 DIAGNOSIS — O4693 Antepartum hemorrhage, unspecified, third trimester: Secondary | ICD-10-CM | POA: Diagnosis not present

## 2018-11-19 LAB — URINALYSIS, ROUTINE W REFLEX MICROSCOPIC
Bilirubin Urine: NEGATIVE
Glucose, UA: NEGATIVE mg/dL
Hgb urine dipstick: NEGATIVE
Ketones, ur: NEGATIVE mg/dL
Nitrite: NEGATIVE
Protein, ur: NEGATIVE mg/dL
Specific Gravity, Urine: 1.021 (ref 1.005–1.030)
pH: 6 (ref 5.0–8.0)

## 2018-11-19 LAB — WET PREP, GENITAL
Clue Cells Wet Prep HPF POC: NONE SEEN
Sperm: NONE SEEN
Trich, Wet Prep: NONE SEEN
Yeast Wet Prep HPF POC: NONE SEEN

## 2018-11-19 MED ORDER — CYCLOBENZAPRINE HCL 10 MG PO TABS
10.0000 mg | ORAL_TABLET | Freq: Once | ORAL | Status: AC
  Administered 2018-11-19: 10 mg via ORAL
  Filled 2018-11-19: qty 1

## 2018-11-19 NOTE — Discharge Instructions (Signed)
Preventing Injuries During Pregnancy  Injuries can happen during pregnancy. Minor falls and accidents usually do not harm you or your baby. But some injuries can harm you and your baby. Tell your doctor about any injury you suffer. What can I do to avoid injuries? Safety  Remove rugs and loose objects on the floor.  Wear comfortable shoes that have a good grip. Do not wear shoes that have high heels.  Always wear your seat belt in the car. The lap belt should be below your belly. Always drive safely.  Do not ride on a motorcycle. Activity  Do not take part in rough and violent activities or sports.  Avoid: ? Walking on wet or slippery floors. ? Lifting heavy pots of boiling or hot liquids. ? Fixing electrical problems. ? Being near fires. General instructions  Take over-the-counter and prescription medicines only as told by your doctor.  Know your blood type and the blood type of the baby's father.  If you are a victim of domestic violence: ? Call your local emergency services (911 in the [place] ? Contact the [employer] Violence Hotline for help and support. Get help right away if:  You fall on your belly or receive any serious blow to your belly.  You have a stiff neck or neck pain after a fall or an injury.  You get a headache or have problems with vision after an injury.  You do not feel the baby move or the baby is not moving as much as normal.  You have been a victim of domestic violence or any other kind of attack.  You have been in a car accident.  You have bleeding from your vagina.  Fluid is leaking from your vagina.  You start to have cramping or pain in your belly (contractions).  You have very bad pain in your lower back.  You feel weak or pass out (faint).  You start to throw up (vomit) after an injury.  You have been burned. Summary  Some injuries that happen during pregnancy can do harm to the baby.  Tell your doctor about any  injury.  Take steps to avoid injury. This includes removing rugs and loose objects on the floor. Always wear your seat belt in the car.  Do not take part in rough and violent activities or sports.  Get help right away if you have any serious accident or injury. This information is not intended to replace advice given to you by your health care provider. Make sure you discuss any questions you have with your health care provider. Document Released: 05/29/2010 Document Revised: 05/05/2016 Document Reviewed: 05/05/2016 Elsevier Patient Education  2020 [employer].

## 2018-11-19 NOTE — MAU Note (Signed)
Stacey Cook is a 23 y.o. at [redacted]w[redacted]d here in MAU reporting: fell yesterday morning, states she slipped on one of her son's toys and fell on right side. States she is having trouble moving her shoulder. Also having some pain in her stomach. Saw some bleeding yesterday afternoon one time and nothing since. Had some mucus discharge yesterday. States she has been feeling some FM but not as much as normal.   Onset of complaint: yesterday  Pain score: shoulder 8/10, abdominal pain 7/10  Vitals:   11/19/18 1435  BP: 118/62  Pulse: (!) 110  Resp: 18  Temp: 98.1 F (36.7 C)  SpO2: 98%     FHT: 147   Lab orders placed from triage: UA, unable to give a sample at this time

## 2018-11-19 NOTE — MAU Provider Note (Addendum)
History     CSN: [id number]  Arrival date and time: 11/19/18 1413   First Provider Initiated Contact with Patient 11/19/18 1459      Chief Complaint  Patient presents with  . Abdominal Pain  . Decreased Fetal Movement  . Shoulder Pain   HPI Stacey Cook is a 23 y.o. G3P1011 at [redacted]w[redacted]d who presents to MAU with for evaluation after a fall at home yesterday morning. Patient states she woke up around 0600 yesterday 11/18/18, tripped over her son's toys and landed on her right side. She states her right shoulder caught most of her weight and she rolled onto her right side.   Shoulder Pain This is a new problem, onset immediately after falling. Patient reports pain in her deltoid and scapula. She states her pain occurs when she raises her arm to shoulder level but pain resolves when her arm is resting at her side. She took Flexeril and applied a warm compress yesterday but did not experience relief. She also attempted to immobilize her arm by wearing an old shoulder sling but did not experience relief. She has not taken medication or tried other treatments today. She rates her pain as 8/10 upon arrival in MAU.  Vaginal Bleeding and Abnormal Vaginal Discharge Patient endorses vaginal bleeding x 1 episode around 1400 hours yesterday. She denies ongoing vaginal bleeding but states she has felt trickles of fluid since her fall. She denies gushes of fluid.  Patient states she planned to come for evaluation yesterday but was afraid she would be admitted and she didn't want to be admitted to the hospital without her husband, who had to work all day yesterday.  Patient denies weakness, syncope, decreased fetal movement, fever, falls, or recent illness.    OB History    Gravida  3   Para  1   Term  1   Preterm  0   AB  1   Living  1     SAB  1   TAB  0   Ectopic  0   Multiple  0   Live Births  1           Past Medical History:  Diagnosis Date  . Abdominal pain   . ADD  (attention deficit disorder)   . Anemia   . Anginal pain ([place])   . Anxiety   . Arthritis    knees  . Asthma   . 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 Cervicalgia   . Cholestasis during pregnancy   . Chronic abdominal pain   . Complication of anesthesia    light headed  . Constipation   . Depression    not on meds  . Depression with anxiety 06/13/2017   06/13/17 rx zoloft 25mg  daily           06/06/18 w/ occ SI, rx Lexapro 10mg , [facility] for therapy  . Ear mass   . Gestational diabetes    pt states not been checking sugars regularly at home; nor has she been taking her Metformin  . Gestational diabetes   . Headache   . HSV infection   . Low iron   . PONV (postoperative nausea and vomiting)   . Suicidal intent   . UTI (lower urinary tract infection) 05/2014  . Vomiting     Past Surgical History:  Procedure Laterality Date  . CHOLECYSTECTOMY N/A 01/16/2018   Procedure: LAPAROSCOPIC CHOLECYSTECTOMY;  Surgeon: Aviva Signs, MD;  Location: [facility];  Service: General;  Laterality: N/A;  per Dr. Arnoldo Morale, pt will most likely go home and he will tell her to arrive at 10:45 - already has labs  . CHOLESTEATOMA EXCISION    . DILATION AND EVACUATION N/A 09/11/2016   Procedure: DILATATION AND CURRATAGE  2ND TRIMESTER;  Surgeon: Jonnie Kind, MD;  Location: [facility];  Service: Gynecology;  Laterality: N/A;  . IMPLANTATION BONE ANCHORED HEARING AID Right 04/2013  . INNER EAR SURGERY Right 03/31/2018  . 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 TYMPANOPLASTY Left   . TYMPANOSTOMY      Family History  Problem Relation Age of Onset  . Cholelithiasis Mother   . Kidney disease Mother        stones  . Depression Mother   . Hypertension Maternal Grandmother   . Diabetes Maternal Grandmother   . Stroke Maternal Grandmother   . Seizures Maternal Grandmother   . Asthma Maternal Grandmother   . Hyperlipidemia Maternal Grandmother   . Thyroid  disease Maternal Grandmother   . Asthma Brother   . Seizures Maternal Uncle   . Cancer Other        breast- great aunt  . Celiac disease Neg Hx     Social History   Tobacco Use  . Smoking status: Former Smoker    Packs/day: 0.50    Years: 1.00    Pack years: 0.50    Types: Cigarettes    Quit date: 03/11/2017    Years since quitting: 1.6  . Smokeless tobacco: Never Used  Substance Use Topics  . Alcohol use: No    Alcohol/week: 0.0 standard drinks  . Drug use: No    Allergies:  Allergies  Allergen Reactions  . Fentanyl Nausea And Vomiting  . Keflex [Cephalexin] Other (See Comments)    Reaction:  Elevated liver enzymes   . Other Other (See Comments)    Pt is allergic to surgical glue swelling;  Cats-congestion, sneezing, eyes watery, itching all over Reaction:  Infection   . Adhesive [Tape] Rash    Medications Prior to Admission  Medication Sig Dispense Refill Last Dose  . acetaminophen (TYLENOL) 500 MG tablet Take 500 mg by mouth as needed.     . Blood Pressure Monitoring (BLOOD PRESSURE MONITOR AUTOMAT) DEVI Take BP at home per provider instructions 1 Device 0   . cyclobenzaprine (FLEXERIL) 10 MG tablet Take 1 tablet (10 mg total) by mouth 3 (three) times daily as needed for muscle spasms. May use 1/2 tablet for mild back pain. 30 tablet 0   . escitalopram (LEXAPRO) 20 MG tablet Take 1 tablet (20 mg total) by mouth daily. 30 tablet 6   . ferrous sulfate 325 (65 FE) MG tablet Take 1 tablet (325 mg total) by mouth 2 (two) times daily with a meal. 60 tablet 3   . pantoprazole (PROTONIX) 20 MG tablet Take 1 tablet (20 mg total) by mouth daily. 30 tablet 6   . prenatal vitamin w/FE, FA (PRENATAL 1 + 1) 27-1 MG TABS tablet Take 1 tablet by mouth daily at 12 noon. 30 each 12     Review of Systems  Respiratory: Negative for shortness of breath.   Gastrointestinal: Negative for abdominal pain, nausea and vomiting.  Genitourinary: Positive for vaginal bleeding. Negative for  difficulty urinating, dysuria and flank pain.  Musculoskeletal: Positive for myalgias.  Neurological: Negative for dizziness, syncope, weakness and headaches.  All other systems reviewed and are negative.  Physical  Exam   Blood pressure 128/84, pulse 99, temperature 98.1 F (36.7 C), temperature source Oral, resp. rate 18, height 4\' 11"  (1.499 m), weight 90.9 kg, last menstrual period 03/29/2018, SpO2 98 %, not currently breastfeeding.  Physical Exam  Nursing note and vitals reviewed. Constitutional: She is oriented to person, place, and time. She appears well-developed and well-nourished.  Cardiovascular: Normal rate.  Respiratory: Effort normal. No respiratory distress.  GI: Soft. She exhibits no distension. There is no abdominal tenderness. There is no rigidity, no rebound, no guarding and no CVA tenderness.  Gravid  Genitourinary:    Vagina normal.     No vaginal discharge.     Genitourinary Comments: No bleeding observed in SSE or at any time during evaluation in MAU   Musculoskeletal: Normal range of motion.        General: No tenderness or edema.     Comments: Right shoulder appears to be functioning normally, patient repositioning in MAU stretcher and gesturing with right arm without difficulty  Neurological: She is alert and oriented to person, place, and time. Coordination normal.  Skin: Skin is warm and dry.  Psychiatric: She has a normal mood and affect. Her behavior is normal. Judgment and thought content normal.    MAU Course/MDM  Procedures: sterile speculum exam  --Discussed UA results. Presence of squam epithelial cells suggests dirty catch. Patient asymptomatic but with history of GBS Bacteruria. Urine culture pending --S/p four hours of continuous monitoring --Reactive tracing: baseline 135, moderate variability, positive accels, no decels --Toco: Quiet --No concerning findings on preliminary MFM exam. Formal report pending --Closed cervix --No abnormal  discharge noted, negative pooling, negative fern  Patient Vitals for the past 24 hrs:  BP Temp Temp src Pulse Resp SpO2 Height Weight  11/19/18 1844 (!) 109/57 - - 97 16 - - -  11/19/18 1510 128/84 - - 99 - - - -  11/19/18 1435 118/62 98.1 F (36.7 C) Oral (!) 110 18 98 % - -  11/19/18 1429 - - - - - - 4\' 11"  (1.499 m) 90.9 kg    Results for orders placed or performed during the hospital encounter of 11/19/18 (from the past 24 hour(s))  Urinalysis, Routine w reflex microscopic     Status: Abnormal   Collection Time: 11/19/18  3:08 PM  Result Value Ref Range   Color, Urine YELLOW YELLOW   APPearance CLOUDY (A) CLEAR   Specific Gravity, Urine 1.021 1.005 - 1.030   pH 6.0 5.0 - 8.0   Glucose, UA NEGATIVE NEGATIVE mg/dL   Hgb urine dipstick NEGATIVE NEGATIVE   Bilirubin Urine NEGATIVE NEGATIVE   Ketones, ur NEGATIVE NEGATIVE mg/dL   Protein, ur NEGATIVE NEGATIVE mg/dL   Nitrite NEGATIVE NEGATIVE   Leukocytes,Ua MODERATE (A) NEGATIVE   RBC / HPF 0-5 0 - 5 RBC/hpf   WBC, UA 11-20 0 - 5 WBC/hpf   Bacteria, UA FEW (A) NONE SEEN   Squamous Epithelial / LPF 21-50 0 - 5   Mucus PRESENT   Wet prep, genital     Status: Abnormal   Collection Time: 11/19/18  3:13 PM  Result Value Ref Range   Yeast Wet Prep HPF POC NONE SEEN NONE SEEN   Trich, Wet Prep NONE SEEN NONE SEEN   Clue Cells Wet Prep HPF POC NONE SEEN NONE SEEN   WBC, Wet Prep HPF POC MANY (A) NONE SEEN   Sperm NONE SEEN    Assessment and Plan  --23 y.o. G3P1011 at [redacted]w[redacted]d  --  Reactive tracing, closed cervix --Urine culture in work --Patient reports shoulder pain 6/10 at discharge. Declines transfer to [facility] for evaluation.  --Discharge home in stable condition  F/U: Pt has OB appt tomorrow 11/20/18 at [place], [place] 11/19/2018, 7:06 PM

## 2018-11-20 ENCOUNTER — Ambulatory Visit (INDEPENDENT_AMBULATORY_CARE_PROVIDER_SITE_OTHER): Payer: Medicaid Other | Admitting: Women's Health

## 2018-11-20 ENCOUNTER — Encounter: Payer: Self-pay | Admitting: Women's Health

## 2018-11-20 VITALS — BP 136/82 | HR 93

## 2018-11-20 DIAGNOSIS — Z3483 Encounter for supervision of other normal pregnancy, third trimester: Secondary | ICD-10-CM

## 2018-11-20 LAB — CULTURE, OB URINE

## 2018-11-20 MED ORDER — ESOMEPRAZOLE MAGNESIUM 20 MG PO CPDR: 20.0000 mg | DELAYED_RELEASE_CAPSULE | Freq: Every day | ORAL | 6 refills | Status: DC

## 2018-11-20 NOTE — Progress Notes (Signed)
TELEHEALTH VIRTUAL OBSTETRICS VISIT ENCOUNTER NOTE Patient name: Stacey Cook MRN [id number]  Date of birth: 11-17-1995  I connected with patient on 11/20/18 at 11:00 AM EDT by [facility] and verified that I am speaking with the correct person using two identifiers. Due to COVID-19 recommendations, pt is not currently in our office.    I discussed the limitations, risks, security and privacy concerns of performing an evaluation and management service by telephone and the availability of in person appointments. I also discussed with the patient that there may be a patient responsible charge related to this service. The patient expressed understanding and agreed to proceed.  Chief Complaint:   Routine Prenatal Visit  History of Present Illness:   Stacey Cook is a 23 y.o. G2P1011 female at [redacted]w[redacted]d with an Estimated Date of Delivery: 01/12/19 being evaluated today for ongoing management of a low-risk pregnancy.  Today she reports went to mau yesterday w/ decreased fm, fall day before. Everything normal. . Contractions: Irritability. Vag. Bleeding: None.  Movement: Present. denies leaking of fluid. Review of Systems:   Pertinent items are noted in HPI Denies abnormal vaginal discharge w/ itching/odor/irritation, headaches, visual changes, shortness of breath, chest pain, abdominal pain, severe nausea/vomiting, or problems with urination or bowel movements unless otherwise stated above. Pertinent History Reviewed:  Reviewed past medical,surgical, social, obstetrical and family history.  Reviewed problem list, medications and allergies. Physical Assessment:   Vitals:   11/20/18 1131  BP: 136/82  Pulse: 93  There is no height or weight on file to calculate BMI.        Physical Examination:   General:  Alert, oriented and cooperative.   Mental Status: Normal mood and affect perceived. Normal judgment and thought content.  Rest of physical exam deferred due to type of encounter  Results for orders  placed or performed during the hospital encounter of 11/19/18 (from the past 24 hour(s))  Culture, OB Urine   Collection Time: 11/19/18  3:08 PM   Specimen: Urine, Random  Result Value Ref Range   Specimen Description URINE, RANDOM    Special Requests NONE    Culture (A)     MULTIPLE SPECIES PRESENT, SUGGEST RECOLLECTION NO GROUP B STREP (S.AGALACTIAE) ISOLATED Performed at [facility] [address] [place], [place] [address]    Report Status 11/20/2018 FINAL   Urinalysis, Routine w reflex microscopic   Collection Time: 11/19/18  3:08 PM  Result Value Ref Range   Color, Urine YELLOW YELLOW   APPearance CLOUDY (A) CLEAR   Specific Gravity, Urine 1.021 1.005 - 1.030   pH 6.0 5.0 - 8.0   Glucose, UA NEGATIVE NEGATIVE mg/dL   Hgb urine dipstick NEGATIVE NEGATIVE   Bilirubin Urine NEGATIVE NEGATIVE   Ketones, ur NEGATIVE NEGATIVE mg/dL   Protein, ur NEGATIVE NEGATIVE mg/dL   Nitrite NEGATIVE NEGATIVE   Leukocytes,Ua MODERATE (A) NEGATIVE   RBC / HPF 0-5 0 - 5 RBC/hpf   WBC, UA 11-20 0 - 5 WBC/hpf   Bacteria, UA FEW (A) NONE SEEN   Squamous Epithelial / LPF 21-50 0 - 5   Mucus PRESENT   Wet prep, genital   Collection Time: 11/19/18  3:13 PM  Result Value Ref Range   Yeast Wet Prep HPF POC NONE SEEN NONE SEEN   Trich, Wet Prep NONE SEEN NONE SEEN   Clue Cells Wet Prep HPF POC NONE SEEN NONE SEEN   WBC, Wet Prep HPF POC MANY (A) NONE SEEN  Sperm NONE SEEN     Assessment & Plan:  1) Pregnancy G3P1011 at [redacted]w[redacted]d with an Estimated Date of Delivery: 01/12/19   2) H/O shoulder dystocia, plan EFW @ 36wks  3) H/O PPH  4) H/O GDM> normal GTT this pregnancy  5) H/O ICP> bile acids normal this pregnancy   Meds:  Meds ordered this encounter  Medications  . esomeprazole (NEXIUM) 20 MG capsule    Sig: Take 1 capsule (20 mg total) by mouth daily at 12 noon.    Dispense:  30 capsule    Refill:  6    Order Specific Question:   Supervising Provider    Answer:   [name] [[id number]]    Labs/procedures today: none  Plan:  Continue routine obstetrical care.  Has home bp cuff.  Check bp weekly, let us know if >140/90.   Reviewed: Preterm labor symptoms and general obstetric precautions including but not limited to vaginal bleeding, contractions, leaking of fluid and fetal movement were reviewed in detail with the patient. The patient was advised to call back or seek an in-person office evaluation/go to [facility] at [facility] for any urgent or concerning symptoms. All questions were answered. Please refer to After Visit Summary for other counseling recommendations.    I provided 15 minutes of non-face-to-face time during this encounter.  Follow-up: Return in about 2 weeks (around 12/04/2018) for [place], Webex; then 4wks from now for efw u/s and LROB in person.  No orders of the defined types were placed in this encounter.  [place], [facility] 11/20/2018 1:00 PM

## 2018-11-20 NOTE — Patient Instructions (Signed)
Stacey Cook, I greatly value your feedback.  If you receive a survey following your visit with [name] today, we appreciate you taking the time to fill it out.  Thanks, [name], CNM, [facility]  [place]!!! It is now [place] at [facility] ([place], [place] [address]) Entrance located off of [address] parking    Call the office [phone]) or go to [facility] if:  You begin to have strong, frequent contractions  Your water breaks.  Sometimes it is a big gush of fluid, sometimes it is just a trickle that keeps getting your panties wet or running down your legs  You have vaginal bleeding.  It is normal to have a small amount of spotting if your cervix was checked.   You don't feel your baby moving like normal.  If you don't, get you something to eat and drink and lay down and focus on feeling your baby move.  You should feel at least 10 movements in 2 hours.  If you don't, you should call the office or go to [place] Blood Pressure Monitoring for Patients   Your provider has recommended that you check your blood pressure (BP) at least once a week at home. If you do not have a blood pressure cuff at home, one will be provided for you. Contact your provider if you have not received your monitor within 1 week.   Helpful Tips for Accurate Home Blood Pressure Checks  . Don't smoke, exercise, or drink caffeine 30 minutes before checking your BP . Use the restroom before checking your BP (a full bladder can raise your pressure) . Relax in a comfortable upright chair . Feet on the ground . Left arm resting comfortably on a flat surface at the level of your heart . Legs uncrossed . Back supported . Sit quietly and don't talk . Place the cuff on your bare arm . Adjust snuggly, so that only two fingertips can fit between your skin and the top of the cuff . Check 2 readings separated by at least one  minute . Keep a log of your BP readings . For a visual, please reference this diagram: http://ccnc.care/bpdiagram  Provider Name: [facility]     Phone: [phone]  Zone 1: ALL CLEAR  Continue to monitor your symptoms:  . BP reading is less than 140 (top number) or less than 90 (bottom number)  . No right upper stomach pain . No headaches or seeing spots . No feeling nauseated or throwing up . No swelling in face and hands  Zone 2: CAUTION Call your doctor's office for any of the following:  . BP reading is greater than 140 (top number) or greater than 90 (bottom number)  . Stomach pain under your ribs in the middle or right side . Headaches or seeing spots . Feeling nauseated or throwing up . Swelling in face and hands  Zone 3: EMERGENCY  Seek immediate medical care if you have any of the following:  . BP reading is greater than160 (top number) or greater than 110 (bottom number) . Severe headaches not improving with Tylenol . Serious difficulty catching your breath . Any worsening symptoms from Zone 2

## 2018-11-21 LAB — GC/CHLAMYDIA PROBE AMP (~~LOC~~) NOT AT ARMC
Chlamydia: NEGATIVE
Neisseria Gonorrhea: NEGATIVE

## 2018-11-30 ENCOUNTER — Telehealth: Payer: Self-pay | Admitting: *Deleted

## 2018-11-30 NOTE — Telephone Encounter (Signed)
Patient states her feet and abdomen are itching so bad that she now has bumps on the areas.  She has tried using Benadryl cream but it does not seem to be helping.  She had cholestasis with her last pregnancy and feels like she is getting it again.  Wants labs drawn.  Informed I will put her on the lab schedule tomorrow for bile acids but cannot have anything to eat or drink after midnight.    Patient also with complaints of her breasts feeling full.  Says she did try to squeeze them to see if anything would come out. Advised patient to avoid breast stimulation as this can cause PTL and can cause milk production.  Encouraged to wear a tight fitting bra and avoid any type of stimulation. Pt verbalized understanding.

## 2018-12-01 ENCOUNTER — Other Ambulatory Visit: Payer: Self-pay

## 2018-12-01 ENCOUNTER — Other Ambulatory Visit: Payer: Medicaid Other

## 2018-12-01 DIAGNOSIS — L299 Pruritus, unspecified: Secondary | ICD-10-CM

## 2018-12-04 ENCOUNTER — Encounter: Payer: Medicaid Other | Admitting: Obstetrics & Gynecology

## 2018-12-04 LAB — COMPREHENSIVE METABOLIC PANEL
ALT: 11 IU/L (ref 0–32)
AST: 12 IU/L (ref 0–40)
Albumin/Globulin Ratio: 1.3 (ref 1.2–2.2)
Albumin: 3.5 g/dL — ABNORMAL LOW (ref 3.9–5.0)
Alkaline Phosphatase: 182 IU/L — ABNORMAL HIGH (ref 39–117)
BUN/Creatinine Ratio: 14 (ref 9–23)
BUN: 6 mg/dL (ref 6–20)
Bilirubin Total: <0.2 mg/dL (ref 0.0–1.2)
CO2: 18 mmol/L — ABNORMAL LOW (ref 20–29)
Calcium: 9.3 mg/dL (ref 8.7–10.2)
Chloride: 102 mmol/L (ref 96–106)
Creatinine, Ser: 0.43 mg/dL — ABNORMAL LOW (ref 0.57–1.00)
GFR calc Af Amer: 169 mL/min/{1.73_m2} (ref >59)
GFR calc non Af Amer: 147 mL/min/{1.73_m2} (ref >59)
Globulin, Total: 2.6 g/dL (ref 1.5–4.5)
Glucose: 79 mg/dL (ref 65–99)
Potassium: 4.3 mmol/L (ref 3.5–5.2)
Sodium: 136 mmol/L (ref 134–144)
Total Protein: 6.1 g/dL (ref 6.0–8.5)

## 2018-12-04 LAB — BILE ACIDS, TOTAL: Bile Acids Total: 3.6 umol/L (ref 0.0–10.0)

## 2018-12-11 ENCOUNTER — Ambulatory Visit (INDEPENDENT_AMBULATORY_CARE_PROVIDER_SITE_OTHER): Payer: Medicaid Other | Admitting: Women's Health

## 2018-12-11 ENCOUNTER — Encounter: Payer: Self-pay | Admitting: Women's Health

## 2018-12-11 ENCOUNTER — Other Ambulatory Visit: Payer: Self-pay

## 2018-12-11 VITALS — BP 118/68 | HR 103 | Wt 203.0 lb

## 2018-12-11 DIAGNOSIS — Z3A35 35 weeks gestation of pregnancy: Secondary | ICD-10-CM

## 2018-12-11 DIAGNOSIS — N898 Other specified noninflammatory disorders of vagina: Secondary | ICD-10-CM

## 2018-12-11 DIAGNOSIS — Z8759 Personal history of other complications of pregnancy, childbirth and the puerperium: Secondary | ICD-10-CM

## 2018-12-11 DIAGNOSIS — Z3483 Encounter for supervision of other normal pregnancy, third trimester: Secondary | ICD-10-CM

## 2018-12-11 DIAGNOSIS — O368130 Decreased fetal movements, third trimester, not applicable or unspecified: Secondary | ICD-10-CM | POA: Diagnosis not present

## 2018-12-11 DIAGNOSIS — O09293 Supervision of pregnancy with other poor reproductive or obstetric history, third trimester: Secondary | ICD-10-CM

## 2018-12-11 DIAGNOSIS — O99013 Anemia complicating pregnancy, third trimester: Secondary | ICD-10-CM | POA: Diagnosis not present

## 2018-12-11 DIAGNOSIS — O26893 Other specified pregnancy related conditions, third trimester: Secondary | ICD-10-CM

## 2018-12-11 LAB — POCT HEMOGLOBIN: Hemoglobin: 10.1 g/dL — AB (ref 11–14.6)

## 2018-12-11 MED ORDER — FERROUS SULFATE 325 (65 FE) MG PO TABS: 325.0000 mg | ORAL_TABLET | Freq: Two times a day (BID) | ORAL | 3 refills | Status: DC

## 2018-12-11 NOTE — Progress Notes (Signed)
   LOW-RISK PREGNANCY VISIT Patient name: Stacey Cook MRN [id number]  Date of birth: [birth year] Chief Complaint:   Routine Prenatal Visit  History of Present Illness:   Stacey Cook is a 23 y.o. G27P1011 female at [redacted]w[redacted]d with an Estimated Date of Delivery: 01/12/19 being seen today for ongoing management of a low-risk pregnancy.  Today she reports decreased fm, vaginal d/c w/ some itching. Weakness/dizziness, craving chalk. Contractions: Irritability. Vag. Bleeding: None.  Movement: (!) Decreased. denies leaking of fluid. Review of Systems:   Pertinent items are noted in HPI Denies abnormal vaginal discharge w/ itching/odor/irritation, headaches, visual changes, shortness of breath, chest pain, abdominal pain, severe nausea/vomiting, or problems with urination or bowel movements unless otherwise stated above. Pertinent History Reviewed:  Reviewed past medical,surgical, social, obstetrical and family history.  Reviewed problem list, medications and allergies. Physical Assessment:   Vitals:   12/11/18 1618  BP: 118/68  Pulse: (!) 103  Weight: 203 lb (92.1 kg)  Body mass index is 41 kg/m.        Physical Examination:   General appearance: Well appearing, and in no distress  Mental status: Alert, oriented to person, place, and time  Skin: Warm & dry  Cardiovascular: Normal heart rate noted  Respiratory: Normal respiratory effort, no distress  Abdomen: Soft, gravid, nontender  Pelvic: spec exam: normal appearing vaginal d/c, nuswab obtained         Extremities: Edema: Mild pitting, slight indentation  Fetal Status:     Movement: (!) Decreased    NST: FHR baseline 130 bpm, Variability: moderate, Accelerations:present, Decelerations:  Absent= Cat 1/Reactive Toco: none    Results for orders placed or performed in visit on 12/11/18 (from the past 24 hour(s))  POCT hemoglobin   Collection Time: 12/11/18  5:00 PM  Result Value Ref Range   Hemoglobin 10.1 (A) 11 - 14.6 g/dL     Assessment & Plan:  1) Low-risk pregnancy G3P1011 at [redacted]w[redacted]d with an Estimated Date of Delivery: 01/12/19   2) Decreased fm, reactive NST  3) Vaginal d/c> nuswab sent  4) Anemia> hasn't been taking her Fe, states pharmacy said they never got it. Resent today, start asap, fe-rich foods  5) H/O shoulder dystocia> efw u/s next Monday as scheduled  6) H/O PPH  7) H/O ICP   Meds:  Meds ordered this encounter  Medications  . ferrous sulfate 325 (65 FE) MG tablet    Sig: Take 1 tablet (325 mg total) by mouth 2 (two) times daily with a meal.    Dispense:  60 tablet    Refill:  3    Order Specific Question:   Supervising Provider    Answer:   [name] [[id number]]   Labs/procedures today: nuswab, nst  Plan:  Continue routine obstetrical care   Reviewed: Preterm labor symptoms and general obstetric precautions including but not limited to vaginal bleeding, contractions, leaking of fluid and fetal movement were reviewed in detail with the patient.  All questions were answered.   Follow-up: Return for As scheduled 8/10 for efw u/s and lrob.  Orders Placed This Encounter  Procedures  . US OB Follow Up  . NuSwab Vaginitis Plus (VG+)  . POCT hemoglobin   Roma Schanz CNM, [facility] 12/11/2018 5:10 PM

## 2018-12-11 NOTE — Patient Instructions (Signed)
Stacey Cook, I greatly value your feedback.  If you receive a survey following your visit with [name] today, we appreciate you taking the time to fill it out.  Thanks, Stacey Cook, CNM, [facility]  [place]!!! It is now [place] at [facility] ([place], [place] [address]) Entrance located off of [address] parking   Go to [email or web address] to register for FREE online childbirth classes    Call the office [phone]) or go to [facility] if:  You begin to have strong, frequent contractions  Your water breaks.  Sometimes it is a big gush of fluid, sometimes it is just a trickle that keeps getting your panties wet or running down your legs  You have vaginal bleeding.  It is normal to have a small amount of spotting if your cervix was checked.   You don't feel your baby moving like normal.  If you don't, get you something to eat and drink and lay down and focus on feeling your baby move.  You should feel at least 10 movements in 2 hours.  If you don't, you should call the office or go to [place] Blood Pressure Monitoring for Patients   Your provider has recommended that you check your blood pressure (BP) at least once a week at home. If you do not have a blood pressure cuff at home, one will be provided for you. Contact your provider if you have not received your monitor within 1 week.   Helpful Tips for Accurate Home Blood Pressure Checks  . Don't smoke, exercise, or drink caffeine 30 minutes before checking your BP . Use the restroom before checking your BP (a full bladder can raise your pressure) . Relax in a comfortable upright chair . Feet on the ground . Left arm resting comfortably on a flat surface at the level of your heart . Legs uncrossed . Back supported . Sit quietly and don't talk . Place the cuff on your bare arm . Adjust snuggly, so that only two fingertips can fit between your skin and  the top of the cuff . Check 2 readings separated by at least one minute . Keep a log of your BP readings . For a visual, please reference this diagram: http://ccnc.care/bpdiagram  Provider Name: [facility]     Phone: [phone]  Zone 1: ALL CLEAR  Continue to monitor your symptoms:  . BP reading is less than 140 (top number) or less than 90 (bottom number)  . No right upper stomach pain . No headaches or seeing spots . No feeling nauseated or throwing up . No swelling in face and hands  Zone 2: CAUTION Call your doctor's office for any of the following:  . BP reading is greater than 140 (top number) or greater than 90 (bottom number)  . Stomach pain under your ribs in the middle or right side . Headaches or seeing spots . Feeling nauseated or throwing up . Swelling in face and hands  Zone 3: EMERGENCY  Seek immediate medical care if you have any of the following:  . BP reading is greater than160 (top number) or greater than 110 (bottom number) . Severe headaches not improving with Tylenol . Serious difficulty catching your breath . Any worsening symptoms from Zone 2    Preterm Labor and Birth Information  The normal length of a pregnancy is 39-41 weeks. Preterm labor is when labor starts  before 37 completed weeks of pregnancy. What are the risk factors for preterm labor? Preterm labor is more likely to occur in women who:  Have certain infections during pregnancy such as a bladder infection, sexually transmitted infection, or infection inside the uterus (chorioamnionitis).  Have a shorter-than-normal cervix.  Have gone into preterm labor before.  Have had surgery on their cervix.  Are younger than age 79 or older than age 80.  Are African American.  Are pregnant with twins or multiple babies (multiple gestation).  Take street drugs or smoke while pregnant.  Do not gain enough weight while pregnant.  Became pregnant shortly after having been pregnant.  What are the symptoms of preterm labor? Symptoms of preterm labor include:  Cramps similar to those that can happen during a menstrual period. The cramps may happen with diarrhea.  Pain in the abdomen or lower back.  Regular uterine contractions that may feel like tightening of the abdomen.  A feeling of increased pressure in the pelvis.  Increased watery or bloody mucus discharge from the vagina.  Water breaking (ruptured amniotic sac). Why is it important to recognize signs of preterm labor? It is important to recognize signs of preterm labor because babies who are born prematurely may not be fully developed. This can put them at an increased risk for:  Long-term (chronic) heart and lung problems.  Difficulty immediately after birth with regulating body systems, including blood sugar, body temperature, heart rate, and breathing rate.  Bleeding in the brain.  Cerebral palsy.  Learning difficulties.  Death. These risks are highest for babies who are born before 50 weeks of pregnancy. How is preterm labor treated? Treatment depends on the length of your pregnancy, your condition, and the health of your baby. It may involve:  Having a stitch (suture) placed in your cervix to prevent your cervix from opening too early (cerclage).  Taking or being given medicines, such as: ? Hormone medicines. These may be given early in pregnancy to help support the pregnancy. ? Medicine to stop contractions. ? Medicines to help mature the baby's lungs. These may be prescribed if the risk of delivery is high. ? Medicines to prevent your baby from developing cerebral palsy. If the labor happens before 34 weeks of pregnancy, you may need to stay in the hospital. What should I do if I think I am in preterm labor? If you think that you are going into preterm labor, call your health care provider right away. How can I prevent preterm labor in future pregnancies? To increase your chance of having a  full-term pregnancy:  Do not use any tobacco products, such as cigarettes, chewing tobacco, and e-cigarettes. If you need help quitting, ask your health care provider.  Do not use street drugs or medicines that have not been prescribed to you during your pregnancy.  Talk with your health care provider before taking any herbal supplements, even if you have been taking them regularly.  Make sure you gain a healthy amount of weight during your pregnancy.  Watch for infection. If you think that you might have an infection, get it checked right away.  Make sure to tell your health care provider if you have gone into preterm labor before. This information is not intended to replace advice given to you by your health care provider. Make sure you discuss any questions you have with your health care provider. Document Released: 07/17/2003 Document Revised: 08/18/2018 Document Reviewed: 09/17/2015 Elsevier Patient Education  2020 [employer].

## 2018-12-12 ENCOUNTER — Telehealth: Payer: Self-pay | Admitting: Women's Health

## 2018-12-12 ENCOUNTER — Encounter: Payer: Self-pay | Admitting: *Deleted

## 2018-12-12 NOTE — Telephone Encounter (Signed)
Patient called, stated she was seen yesterday and that we were supposed to call in some iron pills for her.  [employer]  [phone]

## 2018-12-15 ENCOUNTER — Other Ambulatory Visit: Payer: Self-pay | Admitting: Women's Health

## 2018-12-15 LAB — NUSWAB VAGINITIS PLUS (VG+)
Candida albicans, NAA: POSITIVE — AB
Candida glabrata, NAA: NEGATIVE
Chlamydia trachomatis, NAA: NEGATIVE
Megasphaera 1: HIGH Score — AB
Neisseria gonorrhoeae, NAA: NEGATIVE
Trich vag by NAA: NEGATIVE

## 2018-12-15 LAB — SPECIMEN STATUS REPORT

## 2018-12-15 MED ORDER — METRONIDAZOLE 500 MG PO TABS: 500.0000 mg | ORAL_TABLET | Freq: Two times a day (BID) | ORAL | 0 refills | Status: DC

## 2018-12-15 MED ORDER — TERCONAZOLE 0.4 % VA CREA: 1.0000 | TOPICAL_CREAM | Freq: Every day | VAGINAL | 0 refills | Status: DC

## 2018-12-18 ENCOUNTER — Ambulatory Visit (INDEPENDENT_AMBULATORY_CARE_PROVIDER_SITE_OTHER): Payer: Medicaid Other

## 2018-12-18 ENCOUNTER — Ambulatory Visit (INDEPENDENT_AMBULATORY_CARE_PROVIDER_SITE_OTHER): Payer: Medicaid Other | Admitting: Obstetrics & Gynecology

## 2018-12-18 ENCOUNTER — Encounter: Payer: Self-pay | Admitting: Obstetrics & Gynecology

## 2018-12-18 ENCOUNTER — Other Ambulatory Visit: Payer: Self-pay

## 2018-12-18 VITALS — BP 123/70 | HR 117 | Wt 205.0 lb

## 2018-12-18 DIAGNOSIS — Z3A36 36 weeks gestation of pregnancy: Secondary | ICD-10-CM

## 2018-12-18 DIAGNOSIS — Z331 Pregnant state, incidental: Secondary | ICD-10-CM

## 2018-12-18 DIAGNOSIS — O09293 Supervision of pregnancy with other poor reproductive or obstetric history, third trimester: Secondary | ICD-10-CM | POA: Diagnosis not present

## 2018-12-18 DIAGNOSIS — Z1389 Encounter for screening for other disorder: Secondary | ICD-10-CM

## 2018-12-18 DIAGNOSIS — O3660X0 Maternal care for excessive fetal growth, unspecified trimester, not applicable or unspecified: Secondary | ICD-10-CM

## 2018-12-18 DIAGNOSIS — Z8759 Personal history of other complications of pregnancy, childbirth and the puerperium: Secondary | ICD-10-CM

## 2018-12-18 DIAGNOSIS — Z3483 Encounter for supervision of other normal pregnancy, third trimester: Secondary | ICD-10-CM

## 2018-12-18 LAB — POCT URINALYSIS DIPSTICK OB
Blood, UA: NEGATIVE
Glucose, UA: NEGATIVE
Ketones, UA: NEGATIVE
Leukocytes, UA: NEGATIVE
Nitrite, UA: NEGATIVE
POC,PROTEIN,UA: NEGATIVE

## 2018-12-18 NOTE — Progress Notes (Signed)
   LOW-RISK PREGNANCY VISIT Patient name: Stacey Cook MRN [id number]  Date of birth: 26-Oct-1995 Chief Complaint:   Routine Prenatal Visit (u/s/lot pressure when sitting)  History of Present Illness:   Stacey Cook is a 23 y.o. G1P1011 female at [redacted]w[redacted]d with an Estimated Date of Delivery: 01/12/19 being seen today for ongoing management of a low-risk pregnancy.  Today she reports no complaints. Contractions: Regular.  .  Movement: Present. denies leaking of fluid. Review of Systems:   Pertinent items are noted in HPI Denies abnormal vaginal discharge w/ itching/odor/irritation, headaches, visual changes, shortness of breath, chest pain, abdominal pain, severe nausea/vomiting, or problems with urination or bowel movements unless otherwise stated above. Pertinent History Reviewed:  Reviewed past medical,surgical, social, obstetrical and family history.  Reviewed problem list, medications and allergies. Physical Assessment:   Vitals:   12/18/18 1407  BP: 123/70  Pulse: (!) 117  Weight: 205 lb (93 kg)  Body mass index is 41.4 kg/m.        Physical Examination:   General appearance: Well appearing, and in no distress  Mental status: Alert, oriented to person, place, and time  Skin: Warm & dry  Cardiovascular: Normal heart rate noted  Respiratory: Normal respiratory effort, no distress  Abdomen: Soft, gravid, nontender  Pelvic: Cervical exam performed         Extremities: Edema: Trace  Fetal Status:     Movement: Present    Results for orders placed or performed in visit on 12/18/18 (from the past 24 hour(s))  POC Urinalysis Dipstick OB   Collection Time: 12/18/18  2:13 PM  Result Value Ref Range   Color, UA     Clarity, UA     Glucose, UA Negative Negative   Bilirubin, UA     Ketones, UA neg    Spec Grav, UA     Blood, UA neg    pH, UA     POC,PROTEIN,UA Negative Negative, Trace, Small (1+), Moderate (2+), Large (3+), 4+   Urobilinogen, UA     Nitrite, UA neg    Leukocytes,  UA Negative Negative   Appearance     Odor      Assessment & Plan:  1) Low-risk pregnancy G3P1011 at [redacted]w[redacted]d with an Estimated Date of Delivery: 01/12/19   2) Hx of SD and suspected fetal macrosomia with this pregnancy: Scheduled for a primary C section 01/08/2019@0730  LHE Discussed at length with patient if she were to come in in labor in the meantime she will opt for a primary Caes[place] section   Meds: No orders of the defined types were placed in this encounter.  Labs/procedures today: sonogram  Plan:  Continue routine obstetrical care   Reviewed: Term labor symptoms and general obstetric precautions including but not limited to vaginal bleeding, contractions, leaking of fluid and fetal movement were reviewed in detail with the patient.  All questions were answered.  home bp cuff. Rx faxed to . Check bp weekly, let us know if >140/90.   Follow-up: Return in about 1 week (around 12/25/2018) for [place].  Orders Placed This Encounter  Procedures  . POC Urinalysis Dipstick OB   Mertie Clause Eure  12/18/2018 3:01 PM

## 2018-12-18 NOTE — Progress Notes (Signed)
[name] 43+8 wks,cephalic,anterior placenta gr 1,afi 11 cm,fhr 166 bpm,normal right ovary,simple left ovarian cyst 3 x 3.1 x 2.2 cm (smaller),efw 3534 g 95%,LVEICF

## 2018-12-24 ENCOUNTER — Inpatient Hospital Stay (HOSPITAL_COMMUNITY)
Admission: EM | Admit: 2018-12-24 | Discharge: 2018-12-24 | Disposition: A | Discharge status: Home / Self Care | Payer: Medicaid Other | Attending: Obstetrics and Gynecology | Admitting: Obstetrics and Gynecology

## 2018-12-24 ENCOUNTER — Other Ambulatory Visit: Payer: Self-pay

## 2018-12-24 ENCOUNTER — Encounter (HOSPITAL_COMMUNITY): Payer: Self-pay | Admitting: *Deleted

## 2018-12-24 DIAGNOSIS — O26893 Other specified pregnancy related conditions, third trimester: Secondary | ICD-10-CM | POA: Diagnosis not present

## 2018-12-24 DIAGNOSIS — R109 Unspecified abdominal pain: Secondary | ICD-10-CM | POA: Diagnosis not present

## 2018-12-24 DIAGNOSIS — O368130 Decreased fetal movements, third trimester, not applicable or unspecified: Secondary | ICD-10-CM | POA: Insufficient documentation

## 2018-12-24 DIAGNOSIS — O98813 Other maternal infectious and parasitic diseases complicating pregnancy, third trimester: Secondary | ICD-10-CM

## 2018-12-24 DIAGNOSIS — R8271 Bacteriuria: Secondary | ICD-10-CM

## 2018-12-24 DIAGNOSIS — Z87891 Personal history of nicotine dependence: Secondary | ICD-10-CM | POA: Insufficient documentation

## 2018-12-24 DIAGNOSIS — Z3A37 37 weeks gestation of pregnancy: Secondary | ICD-10-CM | POA: Insufficient documentation

## 2018-12-24 DIAGNOSIS — Z3483 Encounter for supervision of other normal pregnancy, third trimester: Secondary | ICD-10-CM

## 2018-12-24 LAB — COMPREHENSIVE METABOLIC PANEL
ALT: 17 U/L (ref 0–44)
AST: 18 U/L (ref 15–41)
Albumin: 2.8 g/dL — ABNORMAL LOW (ref 3.5–5.0)
Alkaline Phosphatase: 160 U/L — ABNORMAL HIGH (ref 38–126)
Anion gap: 9 (ref 5–15)
BUN: 7 mg/dL (ref 6–20)
CO2: 21 mmol/L — ABNORMAL LOW (ref 22–32)
Calcium: 9.6 mg/dL (ref 8.9–10.3)
Chloride: 105 mmol/L (ref 98–111)
Creatinine, Ser: 0.36 mg/dL — ABNORMAL LOW (ref 0.44–1.00)
GFR calc Af Amer: >60 mL/min (ref >60)
GFR calc non Af Amer: >60 mL/min (ref >60)
Glucose, Bld: 105 mg/dL — ABNORMAL HIGH (ref 70–99)
Potassium: 3.7 mmol/L (ref 3.5–5.1)
Sodium: 135 mmol/L (ref 135–145)
Total Bilirubin: 0.4 mg/dL (ref 0.3–1.2)
Total Protein: 6.6 g/dL (ref 6.5–8.1)

## 2018-12-24 LAB — CBC WITH DIFFERENTIAL/PLATELET
Abs Immature Granulocytes: 0.1 10*3/uL — ABNORMAL HIGH (ref 0.00–0.07)
Basophils Absolute: 0.1 10*3/uL (ref 0.0–0.1)
Basophils Relative: 1 %
Eosinophils Absolute: 0 10*3/uL (ref 0.0–0.5)
Eosinophils Relative: 0 %
HCT: 32.8 % — ABNORMAL LOW (ref 36.0–46.0)
Hemoglobin: 10.2 g/dL — ABNORMAL LOW (ref 12.0–15.0)
Immature Granulocytes: 1 %
Lymphocytes Relative: 17 %
Lymphs Abs: 1.8 10*3/uL (ref 0.7–4.0)
MCH: 26.2 pg (ref 26.0–34.0)
MCHC: 31.1 g/dL (ref 30.0–36.0)
MCV: 84.3 fL (ref 80.0–100.0)
Monocytes Absolute: 0.8 10*3/uL (ref 0.1–1.0)
Monocytes Relative: 7 %
Neutro Abs: 7.7 10*3/uL (ref 1.7–7.7)
Neutrophils Relative %: 74 %
Platelets: 151 10*3/uL (ref 150–400)
RBC: 3.89 MIL/uL (ref 3.87–5.11)
RDW: 17.4 % — ABNORMAL HIGH (ref 11.5–15.5)
WBC: 10.4 10*3/uL (ref 4.0–10.5)
nRBC: 0 % (ref 0.0–0.2)

## 2018-12-24 LAB — URINALYSIS, ROUTINE W REFLEX MICROSCOPIC
Bilirubin Urine: NEGATIVE
Glucose, UA: NEGATIVE mg/dL
Hgb urine dipstick: NEGATIVE
Ketones, ur: NEGATIVE mg/dL
Nitrite: NEGATIVE
Protein, ur: NEGATIVE mg/dL
Specific Gravity, Urine: 1.008 (ref 1.005–1.030)
pH: 7 (ref 5.0–8.0)

## 2018-12-24 LAB — AMNISURE RUPTURE OF MEMBRANE (ROM) NOT AT ARMC: Amnisure ROM: NEGATIVE

## 2018-12-24 LAB — POCT FERN TEST: POCT Fern Test: NEGATIVE

## 2018-12-24 MED ORDER — HYDROCODONE-ACETAMINOPHEN 5-325 MG PO TABS
2.0000 | ORAL_TABLET | Freq: Once | ORAL | Status: AC
  Administered 2018-12-24: 2 via ORAL
  Filled 2018-12-24: qty 2

## 2018-12-24 MED ORDER — SODIUM CHLORIDE 0.9 % IV BOLUS
1000.0000 mL | Freq: Once | INTRAVENOUS | Status: AC
  Administered 2018-12-24: 1000 mL via INTRAVENOUS

## 2018-12-24 MED ORDER — NIFEDIPINE 10 MG PO CAPS
10.0000 mg | ORAL_CAPSULE | Freq: Once | ORAL | Status: AC
  Administered 2018-12-24: 10 mg via ORAL
  Filled 2018-12-24: qty 1

## 2018-12-24 NOTE — MAU Provider Note (Signed)
Obstetric Attending MAU Note  Chief Complaint:  Contractions and Decreased Fetal Movement   None    HPI: Stacey Cook is a 23 y.o. G3P1011 at [redacted]w[redacted]d who presents to maternity admissions via EMS from [facility] where she was seen this morning reporting contractions,, as well as decreased fetal movement.  She is monitored showing fetal wellbeing she had some mucousy discharge and thought that she might be leaking.,  on further questioning, when she arrived here. She is having some left lower quadrant discomfort.  This is intermittent with position, worse when completely supine but resolves when she is in a oblique position with head of bed upright for 45 degrees. This has been present off and on x days. She has tried Flexeril, but it 'isn't working anymore" Denies contractions, leakage of fluid or vaginal bleeding.  She now reports good fetal movement.  She reports being examined several times in [facility] and being told cervix was 3 cm. Pregnancy Course: [facility] she is scheduled for elective primary cesarean section due to history of  shoulder dystocia..  This is scheduled for 31 August. Patient Active Problem List   Diagnosis Date Noted  . Group B streptococcal bacteriuria 10/31/2018  . Ovarian cyst 07/04/2018  . Supervision of normal pregnancy 06/06/2018  . Short interval between pregnancies affecting pregnancy in first trimester, antepartum 05/19/2018  . 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 Gastroesophageal reflux disease   . Acute gallstone pancreatitis 01/12/2018  . Pancreatitis 01/12/2018  . History of shoulder dystocia in prior pregnancy 12/02/2017  . History of postpartum hemorrhage 12/02/2017  . History of cholestasis during pregnancy 09/21/2017  . History of gestational diabetes 08/24/2017  . Chest pain due to GERD 08/05/2017  .  Depression with anxiety and suicidal ideations 06/13/2017  . History of miscarriage 01/18/2017  . GBS bacteriuria 08/07/2016  . Conductive hearing loss, unilat, unrestrict hearing contralateral side 12/10/2015  . 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 Asthma, mild intermittent 02/18/2015  . 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 Perforation of left tympanic membrane 06/19/2014  . Cervicalgia 12/20/2013  . Abdominal pain, chronic, epigastric 08/08/2013  . ADHD (attention deficit hyperactivity disorder), combined type 08/01/2012  . ODD (oppositional defiant disorder) 08/01/2012  . Vasovagal syncope 02/28/2012  . Cholesteatoma of attic of right ear 02/15/2011    Past Medical History:  Diagnosis Date  . Abdominal pain   . ADD (attention deficit disorder)   . Anemia   . Anginal pain ([place])   . Anxiety   . Arthritis    knees  . Asthma   . 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 Cervicalgia   . Cholestasis during pregnancy   . Chronic abdominal pain   . Complication of anesthesia    light headed  . Constipation   . Depression    not on meds  . Depression with anxiety 06/13/2017   06/13/17 rx zoloft 25mg  daily           06/06/18 w/ occ SI, rx Lexapro 10mg , [facility] for therapy  . Ear mass   . Gestational diabetes    pt states not been checking sugars regularly at home; nor has she been taking her Metformin  .  Gestational diabetes   . Headache   . HSV infection   . Low iron   . PONV (postoperative nausea and vomiting)   . Suicidal intent   . UTI (lower urinary tract infection) 05/2014  . Vomiting     OB History  Gravida Para Term Preterm AB Living  3 1 1  0 1 1  SAB TAB Ectopic Multiple Live Births  1 0 0 0 1    # Outcome Date GA Lbr Len/2nd Weight Sex Delivery Anes PTL Lv  3 Current           2 Term 10/28/17 [redacted]w[redacted]d  10:29 / 01:39 3311 g M Vag-Spont EPI  LIV     Complications: Shoulder Dystocia  1 SAB 09/11/16 [redacted]w[redacted]d           Past Surgical History:  Procedure Laterality Date  . CHOLECYSTECTOMY N/A 01/16/2018   Procedure: LAPAROSCOPIC CHOLECYSTECTOMY;  Surgeon: Aviva Signs, MD;  Location: [facility];  Service: General;  Laterality: N/A;  per Dr. Arnoldo Morale, pt will most likely go home and he will tell her to arrive at 10:45 - already has labs  . CHOLESTEATOMA EXCISION    . DILATION AND EVACUATION N/A 09/11/2016   Procedure: DILATATION AND CURRATAGE  2ND TRIMESTER;  Surgeon: Jonnie Kind, MD;  Location: [facility];  Service: Gynecology;  Laterality: N/A;  . IMPLANTATION BONE ANCHORED HEARING AID Right 04/2013  . INNER EAR SURGERY Right 03/31/2018  . 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 TYMPANOPLASTY Left   . TYMPANOSTOMY      Family History: Family History  Problem Relation Age of Onset  . Cholelithiasis Mother   . Kidney disease Mother        stones  . Depression Mother   . Hypertension Maternal Grandmother   . Diabetes Maternal Grandmother   . Stroke Maternal Grandmother   . Seizures Maternal Grandmother   . Asthma Maternal Grandmother   . Hyperlipidemia Maternal Grandmother   . Thyroid disease Maternal Grandmother   . Asthma Brother   . Seizures Maternal Uncle   . Cancer Other        breast- great aunt  . Celiac disease Neg Hx     Social History: Social History   Tobacco Use  . Smoking status: Former Smoker    Packs/day: 0.50    Years: 1.00    Pack years: 0.50    Types: Cigarettes    Quit date: 03/11/2017    Years since quitting: 1.7  . Smokeless tobacco: Never Used  Substance Use Topics  . Alcohol use: No    Alcohol/week: 0.0 standard drinks  . Drug use: No    Allergies:  Allergies  Allergen Reactions  . Fentanyl Nausea And Vomiting  . Keflex [Cephalexin] Other (See Comments)    Reaction:  Elevated liver enzymes   . Other Other (See Comments)    Pt is  allergic to surgical glue swelling;  Cats-congestion, sneezing, eyes watery, itching all over Reaction:  Infection   . Adhesive [Tape] Rash    Medications Prior to Admission  Medication Sig Dispense Refill Last Dose  . acetaminophen (TYLENOL) 500 MG tablet Take 500 mg by mouth as needed.     . Blood Pressure Monitoring (BLOOD PRESSURE MONITOR AUTOMAT) DEVI Take BP at home per provider instructions 1 Device 0   . cyclobenzaprine (FLEXERIL) 10 MG tablet Take 1 tablet (10 mg total) by mouth 3 (three) times daily as needed for muscle  spasms. May use 1/2 tablet for mild back pain. 30 tablet 0   . escitalopram (LEXAPRO) 20 MG tablet Take 1 tablet (20 mg total) by mouth daily. 30 tablet 6   . esomeprazole (NEXIUM) 20 MG capsule Take 1 capsule (20 mg total) by mouth daily at 12 noon. 30 capsule 6   . ferrous sulfate 325 (65 FE) MG tablet Take 1 tablet (325 mg total) by mouth 2 (two) times daily with a meal. 60 tablet 3   . metroNIDAZOLE (FLAGYL) 500 MG tablet Take 1 tablet (500 mg total) by mouth 2 (two) times daily. 14 tablet 0   . prenatal vitamin w/FE, FA (PRENATAL 1 + 1) 27-1 MG TABS tablet Take 1 tablet by mouth daily at 12 noon. 30 each 12   . terconazole (TERAZOL 7) 0.4 % vaginal cream Place 1 applicator vaginally at bedtime. 45 g 0     ROS: Pertinent findings in history of present illness.  Physical Exam  Blood pressure 125/75, pulse 93, temperature 98.1 F (36.7 C), temperature source Oral, resp. rate 17, height 4\' 11"  (1.499 m), weight 93 kg, last menstrual period 03/29/2018, SpO2 97 %, not currently breastfeeding. CONSTITUTIONAL: Well-developed, well-nourished female in no acute distress.  HENT:  Normocephalic, atraumatic, External right and left ear normal. Oropharynx is clear and moist EYES: Conjunctivae and EOM are normal. Pupils are equal, round, and reactive to light. No scleral icterus.  NECK: Normal range of motion, supple, no masses SKIN: Skin is warm and dry. No rash noted.  Not diaphoretic. No erythema. No pallor. [place]: Alert and oriented to person, place, and time. Normal reflexes, muscle tone coordination. No cranial nerve deficit noted. PSYCHIATRIC: Normal mood and affect. Normal behavior. Normal judgment and thought content. CARDIOVASCULAR: Normal heart rate noted, regular rhythm RESPIRATORY: Effort and breath sounds normal, no problems with respiration noted ABDOMEN: Soft, nontender, nondistended, gravid appropriate for gestational age MUSCULOSKELETAL: Normal range of motion. No edema and no tenderness. 2+ distal pulses.  SPECULUM EXAM: NEFG, physiologic discharge, no blood, cervix clean Dilation: 1 Effacement (%): Thick Cervical Position: Posterior Exam by:: Dr. Glo Herring  -3/-2 difficult to access. Floppy multip external os, which likely explains the descrepancy with prior exam. FHT:  Baseline 135 with accelerations to 160, moderate variability, accelerations present, no decelerations Contractions: q 8-10 mins, mild in character indentable nontender uterus to palpation between contractions.   Labs: Results for orders placed or performed during the hospital encounter of 12/24/18 (from the past 24 hour(s))  CBC with Differential/Platelet     Status: Abnormal   Collection Time: 12/24/18  6:37 AM  Result Value Ref Range   WBC 10.4 4.0 - 10.5 K/uL   RBC 3.89 3.87 - 5.11 MIL/uL   Hemoglobin 10.2 (L) 12.0 - 15.0 g/dL   HCT 32.8 (L) 36.0 - 46.0 %   MCV 84.3 80.0 - 100.0 fL   MCH 26.2 26.0 - 34.0 pg   MCHC 31.1 30.0 - 36.0 g/dL   RDW 17.4 (H) 11.5 - 15.5 %   Platelets 151 150 - 400 K/uL   nRBC 0.0 0.0 - 0.2 %   Neutrophils Relative % 74 %   Neutro Abs 7.7 1.7 - 7.7 K/uL   Lymphocytes Relative 17 %   Lymphs Abs 1.8 0.7 - 4.0 K/uL   Monocytes Relative 7 %   Monocytes Absolute 0.8 0.1 - 1.0 K/uL   Eosinophils Relative 0 %   Eosinophils Absolute 0.0 0.0 - 0.5 K/uL   Basophils Relative 1 %  Basophils Absolute 0.1 0.0 - 0.1 K/uL   Immature  Granulocytes 1 %   Abs Immature Granulocytes 0.10 (H) 0.00 - 0.07 K/uL  Comprehensive metabolic panel     Status: Abnormal   Collection Time: 12/24/18  6:37 AM  Result Value Ref Range   Sodium 135 135 - 145 mmol/L   Potassium 3.7 3.5 - 5.1 mmol/L   Chloride 105 98 - 111 mmol/L   CO2 21 (L) 22 - 32 mmol/L   Glucose, Bld 105 (H) 70 - 99 mg/dL   BUN 7 6 - 20 mg/dL   Creatinine, Ser 0.36 (L) 0.44 - 1.00 mg/dL   Calcium 9.6 8.9 - 10.3 mg/dL   Total Protein 6.6 6.5 - 8.1 g/dL   Albumin 2.8 (L) 3.5 - 5.0 g/dL   AST 18 15 - 41 U/L   ALT 17 0 - 44 U/L   Alkaline Phosphatase 160 (H) 38 - 126 U/L   Total Bilirubin 0.4 0.3 - 1.2 mg/dL   GFR calc non Af Amer >60 >60 mL/min   GFR calc Af Amer >60 >60 mL/min   Anion gap 9 5 - 15    Imaging:  US Ob Follow Up  Result Date: 12/18/2018 FOLLOW UP SONOGRAM [address] is in the office for a follow up sonogram for EFW. She is a 23 y.o. year old G42P1011 with Estimated Date of Delivery: 01/12/19 by early ultrasound now at  [redacted]w[redacted]d weeks gestation. Thus far the pregnancy has been complicated by HX of prior GDM,shoulder dystocia,short interval between pregnancy GESTATION: SINGLETON PRESENTATION: cephalic FETAL ACTIVITY:          Heart rate         166          The fetus is active. AMNIOTIC FLUID: The amniotic fluid volume is  normal, 11 cm. PLACENTA LOCALIZATION:  anterior GRADE 1 CERVIX: Limited view ADNEXA: normal right ovary,,simple left ovarian cyst 3 x 3.1 x 2.2 cm (smaller) GESTATIONAL AGE AND  BIOMETRICS: Gestational criteria: Estimated Date of Delivery: 01/12/19 by early ultrasound now at [redacted]w[redacted]d Previous Scans:3          BIPARIETAL DIAMETER           9.63 cm         39+2 weeks  99% HEAD CIRCUMFERENCE           33.82 cm         38+5 weeks    79% ABDOMINAL CIRCUMFERENCE           34.23 cm         38 weeks    94% FEMUR LENGTH           7.64 cm         39 weeks    95%                                                       AVERAGE EGA(BY THIS SCAN):  38+5 weeks                                                  ESTIMATED FETAL WEIGHT:       3534  grams,  95 % ANATOMICAL SURVEY                                                                            COMMENTS CEREBRAL VENTRICLES yes normal  CHOROID PLEXUS yes normal  CEREBELLUM yes normal  CISTERNA MAGNA yes normal  NUCHAL REGION yes normal      NASAL BONE yes normal      FACIAL PROFILE yes normal  4 CHAMBERED HEART yes  LVEICF OUTFLOW TRACTS yes normal  DIAPHRAGM yes normal  STOMACH yes normal  RENAL REGION yes normal  BLADDER yes normal      3 VESSEL CORD yes normal  SPINE yes normal          GENITALIA yes normal female     SUSPECTED ABNORMALITIES:  simple left ovarian cyst 3 x 3.1 x 2.2 cm (smaller),efw 3534 g 95%,LVEICF QUALITY OF SCAN: satisfactory TECHNICIAN COMMENTS: [name] 93+8 wks,cephalic,anterior placenta gr 1,afi 11 cm,fhr 166 bpm,normal right ovary,simple left ovarian cyst 3 x 3.1 x 2.2 cm (smaller),efw 3534 g 95%,LVEICF,efw 3534 g 95%,LVEICF A copy of this report including all images has been saved and backed up to a second source for retrieval if needed. All measures and details of the anatomical scan, placentation, fluid volume and pelvic anatomy are contained in that report. Amber Heide Guile 12/18/2018 2:13 PM Clinical Impression and recommendations: I have reviewed the sonogram results above, combined with the patient's current clinical course, below are my impressions and any appropriate recommendations for management based on the sonographic findings. 1.  [id number] Estimated Date of Delivery: 01/12/19 by serial sonographic evaluations 2.  Fetal sonographic surveillance findings: a). Normal fluid volume b). Suspected fetal macrosomia with EFW at 95%, in a patient with a history of shoulder dystocia 3.  Normal general sonographic findings Recommend continued prenatal evaluations and care based on this sonogram and as clinically indicated from the patient's clinical course. [name] 12/18/2018 2:41 PM    MAU  Course: External monitoring shows a reactive strip and mild contractions by palpation and external toco shows every 8 to 10 minutes Speculum exam shows physiologic appearing discharge with a tiny bit of pink mucus.  AmniSure is collected.  Fern test negative repeated Patient complains of left lower quadrant discomfort requests analgesic given Procardia 10 mg and Vicodin and will be monitored continuously, reassessment in 1 hour  at 10:"30, the exam shows she's comfortable, sleepy, nontender abdomen. Amnisure is Negative Assessment: musculoskeletal LLQ pain stable                         No evidence ROM                         Reactive NST. 1. Abdominal pain during pregnancy in third trimester   2. Decreased fetal movements in third trimester, single or unspecified fetus   3. Group B streptococcal bacteriuria   4. Encounter for supervision of other normal pregnancy in third trimester     Plan: Discharge home Labor precautions and fetal kick counts reviewed Follow up with OB provider as scheduled this week.     Jonnie Kind, MD 12/24/2018 9:17 AM

## 2018-12-24 NOTE — ED Provider Notes (Signed)
[facility] EM[facility]Y DE[facility]MENT Provider Note   CSN: [id number] Arrival date & time: 12/24/18  1572     History   Chief Complaint Chief Complaint  Patient presents with  . Contractions    HPI Stacey Cook is a 23 y.o. female.     Patient is [redacted] weeks pregnant G3, P1 presenting with decreased fetal movement for the past 2 days.  She also complains of left-sided lower abdominal pain has been constant all night coming and going waxing and waning.  She is not certain if it is a contraction.  States she lost her mucous plug 2 weeks ago.  She denies any gush of fluid or vaginal bleeding.  She denies any nausea or vomiting.  Has pain in her left abdomen and left low back. She states she been having the lower abdominal pain for the all night. She is scheduled for a C-section August 31 by Dr. Elonda Husky for history of shoulder dystocia.  AMIYAH Cook is a 23 y.o. G26P1011 female at [redacted]w[redacted]d with an Estimated Date of Delivery: 01/12/19  The history is provided by the patient.    Past Medical History:  Diagnosis Date  . Abdominal pain   . ADD (attention deficit disorder)   . Anemia   . Anginal pain ([place])   . Anxiety   . Arthritis    knees  . Asthma   . 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 Cervicalgia   . Cholestasis during pregnancy   . Chronic abdominal pain   . Complication of anesthesia    light headed  . Constipation   . Depression    not on meds  . Depression with anxiety 06/13/2017   06/13/17 rx zoloft 25mg  daily           06/06/18 w/ occ SI, rx Lexapro 10mg , [facility] for therapy  . Ear mass   . Gestational diabetes    pt states not been checking sugars regularly at home; nor has she been taking her Metformin  . Gestational diabetes   . Headache   . HSV infection   . Low iron   . PONV (postoperative nausea and vomiting)   . Suicidal intent   . UTI (lower urinary tract infection) 05/2014  . Vomiting     Patient Active Problem List   Diagnosis Date Noted  . Group B streptococcal bacteriuria 10/31/2018  . Ovarian cyst 07/04/2018  . Supervision of normal pregnancy 06/06/2018  . Short interval between pregnancies affecting pregnancy in first trimester, antepartum 05/19/2018  . 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 Gastroesophageal reflux disease   . Acute gallstone pancreatitis 01/12/2018  . Pancreatitis 01/12/2018  . History of shoulder dystocia in prior pregnancy 12/02/2017  . History of postpartum hemorrhage 12/02/2017  . History of cholestasis during pregnancy 09/21/2017  . History of gestational diabetes 08/24/2017  . Chest pain due to GERD 08/05/2017  . Depression with anxiety and suicidal ideations 06/13/2017  . History of miscarriage 01/18/2017  . GBS bacteriuria 08/07/2016  . Conductive hearing loss, unilat, unrestrict hearing contralateral side 12/10/2015  . 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 Asthma, mild intermittent 02/18/2015  . 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 Perforation of left tympanic membrane 06/19/2014  . Cervicalgia 12/20/2013  . Abdominal pain, chronic, epigastric  08/08/2013  . ADHD (attention deficit hyperactivity disorder), combined type 08/01/2012  . ODD (oppositional defiant disorder) 08/01/2012  . Vasovagal syncope 02/28/2012  . Cholesteatoma of attic of right ear 02/15/2011    Past Surgical History:  Procedure Laterality Date  . CHOLECYSTECTOMY N/A 01/16/2018   Procedure: LAPAROSCOPIC CHOLECYSTECTOMY;  Surgeon: Aviva Signs, MD;  Location: [facility];  Service: General;  Laterality: N/A;  per Dr. Arnoldo Morale, pt will most likely go home and he will tell her to arrive at 10:45 - already has labs  . CHOLESTEATOMA EXCISION    . DILATION AND EVACUATION N/A 09/11/2016   Procedure: DILATATION  AND CURRATAGE  2ND TRIMESTER;  Surgeon: Jonnie Kind, MD;  Location: [facility];  Service: Gynecology;  Laterality: N/A;  . IMPLANTATION BONE ANCHORED HEARING AID Right 04/2013  . INNER EAR SURGERY Right 03/31/2018  . 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 TYMPANOPLASTY Left   . TYMPANOSTOMY       OB History    Gravida  3   Para  1   Term  1   Preterm  0   AB  1   Living  1     SAB  1   TAB  0   Ectopic  0   Multiple  0   Live Births  1            Home Medications    Prior to Admission medications   Medication Sig Start Date End Date Taking? Authorizing Provider  acetaminophen (TYLENOL) 500 MG tablet Take 500 mg by mouth as needed.    [provider]  Blood Pressure Monitoring (BLOOD PRESSURE MONITOR AUTOMAT) DEVI Take BP at home per provider instructions 08/17/18   Cresenzo-Dishmon, Joaquim Lai, CNM  cyclobenzaprine (FLEXERIL) 10 MG tablet Take 1 tablet (10 mg total) by mouth 3 (three) times daily as needed for muscle spasms. May use 1/2 tablet for mild back pain. 10/29/18   Burleson, Rona Ravens, NP  escitalopram (LEXAPRO) 20 MG tablet Take 1 tablet (20 mg total) by mouth daily. 09/12/18   Roma Schanz, CNM  esomeprazole (NEXIUM) 20 MG capsule Take 1 capsule (20 mg total) by mouth daily at 12 noon. 11/20/18   Roma Schanz, CNM  ferrous sulfate 325 (65 FE) MG tablet Take 1 tablet (325 mg total) by mouth 2 (two) times daily with a meal. 12/11/18   Roma Schanz, CNM  metroNIDAZOLE (FLAGYL) 500 MG tablet Take 1 tablet (500 mg total) by mouth 2 (two) times daily. 12/15/18   Roma Schanz, CNM  prenatal vitamin w/FE, FA (PRENATAL 1 + 1) 27-1 MG TABS tablet Take 1 tablet by mouth daily at 12 noon. 05/19/18   Estill Dooms, NP  terconazole (TERAZOL 7) 0.4 % vaginal cream Place 1 applicator vaginally at bedtime. 12/15/18   Roma Schanz, CNM    Family History Family History  Problem Relation Age of Onset  . Cholelithiasis  Mother   . Kidney disease Mother        stones  . Depression Mother   . Hypertension Maternal Grandmother   . Diabetes Maternal Grandmother   . Stroke Maternal Grandmother   . Seizures Maternal Grandmother   . Asthma Maternal Grandmother   . Hyperlipidemia Maternal Grandmother   . Thyroid disease Maternal Grandmother   . Asthma Brother   . Seizures Maternal Uncle   . Cancer Other        breast- great aunt  .  Celiac disease Neg Hx     Social History Social History   Tobacco Use  . Smoking status: Former Smoker    Packs/day: 0.50    Years: 1.00    Pack years: 0.50    Types: Cigarettes    Quit date: 03/11/2017    Years since quitting: 1.7  . Smokeless tobacco: Never Used  Substance Use Topics  . Alcohol use: No    Alcohol/week: 0.0 standard drinks  . Drug use: No     Allergies   Fentanyl, Keflex [cephalexin], Other, and Adhesive [tape]   Review of Systems Review of Systems  Constitutional: Negative for activity change, appetite change and fever.  HENT: Negative for congestion and rhinorrhea.   Respiratory: Negative for cough, chest tightness and shortness of breath.   Cardiovascular: Negative for chest pain.  Gastrointestinal: Positive for abdominal pain.  Genitourinary: Positive for flank pain and pelvic pain. Negative for dysuria, hematuria, vaginal bleeding and vaginal discharge.  Neurological: Negative for headaches.    all other systems are negative except as noted in the HPI and PMH.    Physical Exam Updated Vital Signs BP 120/73   Pulse (!) 104   Temp 97.8 F (36.6 C) (Oral)   Resp 20   Ht 4\' 11"  (1.499 m)   Wt 93 kg   LMP 03/29/2018   SpO2 99%   BMI 41.40 kg/m   Physical Exam Vitals signs and nursing note reviewed.  Constitutional:      General: She is not in acute distress.    Appearance: She is well-developed.     Comments: Uncomfortable  HENT:     Head: Normocephalic and atraumatic.     Mouth/Throat:     Pharynx: No oropharyngeal  exudate.  Eyes:     Conjunctiva/sclera: Conjunctivae normal.     Pupils: Pupils are equal, round, and reactive to light.  Neck:     Musculoskeletal: Normal range of motion and neck supple.     Comments: No meningismus. Cardiovascular:     Rate and Rhythm: Normal rate and regular rhythm.     Heart sounds: Normal heart sounds. No murmur.  Pulmonary:     Effort: Pulmonary effort is normal. No respiratory distress.     Breath sounds: Normal breath sounds.  Abdominal:     Palpations: Abdomen is soft.     Tenderness: There is abdominal tenderness. There is no guarding or rebound.     Comments: Gravid abdomen  Genitourinary:    Comments: Chaperone present.  Cervix is high and posterior proximally 3 cm dilated Musculoskeletal: Normal range of motion.        General: No tenderness.  Skin:    General: Skin is warm.     Capillary Refill: Capillary refill takes less than 2 seconds.  Neurological:     General: No focal deficit present.     Mental Status: She is alert and oriented to person, place, and time. Mental status is at baseline.     Cranial Nerves: No cranial nerve deficit.     Motor: No abnormal muscle tone.     Coordination: Coordination normal.     Comments: No ataxia on finger to nose bilaterally. No pronator drift. 5/5 strength throughout. CN 2-12 intact.Equal grip strength. Sensation intact.   Psychiatric:        Behavior: Behavior normal.      ED Treatments / Results  Labs (all labs ordered are listed, but only abnormal results are displayed) Labs Reviewed  CBC WITH DIFFERENTIAL/PLATELET -  Abnormal; Notable for the following components:      Result Value   Hemoglobin 10.2 (*)    HCT 32.8 (*)    RDW 17.4 (*)    Abs Immature Granulocytes 0.10 (*)    All other components within normal limits  COMPREHENSIVE METABOLIC PANEL - Abnormal; Notable for the following components:   CO2 21 (*)    Glucose, Bld 105 (*)    Creatinine, Ser 0.36 (*)    Albumin 2.8 (*)    Alkaline  Phosphatase 160 (*)    All other components within normal limits  URINALYSIS, ROUTINE W REFLEX MICROSCOPIC    EKG None  Radiology No results found.  Procedures Ultrasound ED OB Pelvic  Date/Time: 12/24/2018 7:08 AM Performed by: Ezequiel Essex, MD Authorized by: Ezequiel Essex, MD   Procedure details:    Indications: pregnant with abdominal pain and pregnant with pelvic pain     Assess:  Intrauterine pregnancy, EGA and fetal viability   Technique:  Transabdominal obstetric (HCG+) exam   Images: archived   Study Limitations: body habitus Uterine findings:    Intrauterine pregnancy: identified     Single gestation: identified     Gestational sac: identified     Fetal pole: identified     Fetal heart rate: identified     Estimated gestational age: 37 weeks Left ovary findings:    Left ovary:  Not visualized    Right ovary findings:     Right ovary:  Not visualized    Other findings:    Free pelvic fluid: not identified     Free peritoneal fluid: not identified   Comments:     Fetal movement present   (including critical care time)  Medications Ordered in ED Medications  sodium chloride 0.9 % bolus 1,000 mL (1,000 mLs Intravenous New Bag/Given 12/24/18 0658)     Initial Impression / Assessment and Plan / ED Course  I have reviewed the triage vital signs and the nursing notes.  Pertinent labs & imaging results that were available during my care of the patient were reviewed by me and considered in my medical decision making (see chart for details).       Pregnant with abdominal pain and decreased fetal movement. Fetal  Heart rate in the 130s.  Fetal movement seen on ultrasound.  Patient appears to be having contractions.  Her cervix is approximately 3 cm and posterior.  Emergent transfer discussed with Dr. Elly Modena of OB who accepts patient to MAU at [facility]. EMS to transport.  CRITICAL CARE Performed by: Ezequiel Essex Total critical care  time: 35 minutes Critical care time was exclusive of separately billable procedures and treating other patients. Critical care was necessary to treat or prevent imminent or life-threatening deterioration. Critical care was time spent personally by me on the following activities: development of treatment plan with patient and/or surrogate as well as nursing, discussions with consultants, evaluation of patient's response to treatment, examination of patient, obtaining history from patient or surrogate, ordering and performing treatments and interventions, ordering and review of laboratory studies, ordering and review of radiographic studies, pulse oximetry and re-evaluation of patient's condition.   Final Clinical Impressions(s) / ED Diagnoses   Final diagnoses:  Abdominal pain during pregnancy in third trimester  Decreased fetal movements in third trimester, single or unspecified fetus    ED Discharge Orders    None       Mauriana Dann, Annie Main, MD 12/24/18 [phone]

## 2018-12-24 NOTE — Discharge Instructions (Signed)
Abdominal Pain During Pregnancy ° °Belly (abdominal) pain is common during pregnancy. There are many possible causes. Most of the time, it is not a serious problem. Other times, it can be a sign that something is wrong with the pregnancy. Always tell your doctor if you have belly pain. °Follow these instructions at home: °· Do not have sex or put anything in your vagina until your pain goes away completely. °· Get plenty of rest until your pain gets better. °· Drink enough fluid to keep your pee (urine) pale yellow. °· Take over-the-counter and prescription medicines only as told by your doctor. °· Keep all follow-up visits as told by your doctor. This is important. °Contact a doctor if: °· Your pain continues or gets worse after resting. °· You have lower belly pain that: °? Comes and goes at regular times. °? Spreads to your back. °? Feels like menstrual cramps. °· You have pain or burning when you pee (urinate). °Get help right away if: °· You have a fever or chills. °· You have vaginal bleeding. °· You are leaking fluid from your vagina. °· You are passing tissue from your vagina. °· You throw up (vomit) for more than 24 hours. °· You have watery poop (diarrhea) for more than 24 hours. °· Your baby is moving less than usual. °· You feel very weak or faint. °· You have shortness of breath. °· You have very bad pain in your upper belly. °Summary °· Belly (abdominal) pain is common during pregnancy. There are many possible causes. °· If you have belly pain during pregnancy, tell your doctor right away. °· Keep all follow-up visits as told by your doctor. This is important. °This information is not intended to replace advice given to you by your health care provider. Make sure you discuss any questions you have with your health care provider. °Document Released: 04/14/2009 Document Revised: 08/14/2018 Document Reviewed: 07/29/2016 °Elsevier Patient Education © 2020 [employer] ° °

## 2018-12-24 NOTE — ED Notes (Signed)
[facility] here to transfer pt to [facility].

## 2018-12-24 NOTE — ED Triage Notes (Signed)
Pt reports that she has not felt the baby move in two days, started having lower abd pain tonight, denies any vaginal discharge, is scheduled for c-section 01/08/2019 due to shoulder dystocia,

## 2018-12-24 NOTE — MAU Note (Addendum)
Pt arrived via EMS from [facility] as a labor check also with no fetal movement in 2 days per patient. Pt states she has a scheduled c-section for August 31st due to a history of a shoulder dystocia.   0825: pt reports feeling baby move

## 2018-12-25 ENCOUNTER — Ambulatory Visit (INDEPENDENT_AMBULATORY_CARE_PROVIDER_SITE_OTHER): Payer: Medicaid Other | Admitting: Obstetrics & Gynecology

## 2018-12-25 ENCOUNTER — Encounter: Payer: Self-pay | Admitting: Obstetrics & Gynecology

## 2018-12-25 ENCOUNTER — Other Ambulatory Visit: Payer: Self-pay

## 2018-12-25 ENCOUNTER — Encounter (HOSPITAL_COMMUNITY): Payer: Self-pay

## 2018-12-25 VITALS — BP 117/75 | HR 108 | Wt 208.0 lb

## 2018-12-25 DIAGNOSIS — Z3A37 37 weeks gestation of pregnancy: Secondary | ICD-10-CM

## 2018-12-25 DIAGNOSIS — Z331 Pregnant state, incidental: Secondary | ICD-10-CM

## 2018-12-25 DIAGNOSIS — Z1389 Encounter for screening for other disorder: Secondary | ICD-10-CM

## 2018-12-25 DIAGNOSIS — Z3483 Encounter for supervision of other normal pregnancy, third trimester: Secondary | ICD-10-CM

## 2018-12-25 LAB — POCT URINALYSIS DIPSTICK OB
Blood, UA: NEGATIVE
Glucose, UA: NEGATIVE
Ketones, UA: NEGATIVE
Leukocytes, UA: NEGATIVE
Nitrite, UA: NEGATIVE
POC,PROTEIN,UA: NEGATIVE

## 2018-12-25 MED ORDER — HYDROCODONE-ACETAMINOPHEN 5-325 MG PO TABS: 1.0000 | ORAL_TABLET | Freq: Four times a day (QID) | ORAL | 0 refills | Status: DC | PRN

## 2018-12-25 NOTE — Patient Instructions (Signed)
Stacey Cook  12/25/2018   Your procedure is scheduled on:  01/08/2019  Arrive at [facility] at Entrance C on [address] at [facility]  and [address]. You are invited to use the FREE valet parking or use the Visitor's parking deck.  Pick up the phone at the desk and dial [phone].  Call this number if you have problems the morning of surgery: [phone]  Remember:   Do not eat food:(After Midnight) Desps de medianoche.  Do not drink clear liquids: (After Midnight) Desps de medianoche.  Take these medicines the morning of surgery with A SIP OF WATER:  nexium and lexapro   Do not wear jewelry, make-up or nail polish.  Do not wear lotions, powders, or perfumes. Do not wear deodorant.  Do not shave 48 hours prior to surgery.  Do not bring valuables to the hospital.  [facility] is not   responsible for any belongings or valuables brought to the hospital.  Contacts, dentures or bridgework may not be worn into surgery.  Leave suitcase in the car. After surgery it may be brought to your room.  For patients admitted to the hospital, checkout time is 11:00 AM the day of              discharge.      Please read over the following fact sheets that you were given:     Preparing for Surgery

## 2018-12-25 NOTE — Progress Notes (Signed)
   LOW-RISK PREGNANCY VISIT Patient name: Stacey Cook MRN [id number]  Date of birth: 11-07-1995 Chief Complaint:   Routine Prenatal Visit (gc-chl will send urine)  History of Present Illness:   Stacey Cook is a 23 y.o. G3P1011 female at [redacted]w[redacted]d with an Estimated Date of Delivery: 01/12/19 being seen today for ongoing management of a low-risk pregnancy.  Today she reports left hip pain. Contractions: Regular.  .  Movement: Present. denies leaking of fluid. Review of Systems:   Pertinent items are noted in HPI Denies abnormal vaginal discharge w/ itching/odor/irritation, headaches, visual changes, shortness of breath, chest pain, abdominal pain, severe nausea/vomiting, or problems with urination or bowel movements unless otherwise stated above. Pertinent History Reviewed:  Reviewed past medical,surgical, social, obstetrical and family history.  Reviewed problem list, medications and allergies. Physical Assessment:   Vitals:   12/25/18 1434  BP: 117/75  Pulse: (!) 108  Weight: 208 lb (94.3 kg)  Body mass index is 42.01 kg/m.        Physical Examination:   General appearance: Well appearing, and in no distress  Mental status: Alert, oriented to person, place, and time  Skin: Warm & dry  Cardiovascular: Normal heart rate noted  Respiratory: Normal respiratory effort, no distress  Abdomen: Soft, gravid, nontender  Pelvic: Cervical exam deferred         Extremities: Edema: Trace  Fetal Status: Fetal Heart Rate (bpm): 150 Fundal Height: 40 cm Movement: Present    Results for orders placed or performed in visit on 12/25/18 (from the past 24 hour(s))  POC Urinalysis Dipstick OB   Collection Time: 12/25/18  2:40 PM  Result Value Ref Range   Color, UA     Clarity, UA     Glucose, UA Negative Negative   Bilirubin, UA     Ketones, UA neg    Spec Grav, UA     Blood, UA neg    pH, UA     POC,PROTEIN,UA Negative Negative, Trace, Small (1+), Moderate (2+), Large (3+), 4+   Urobilinogen,  UA     Nitrite, UA neg    Leukocytes, UA Negative Negative   Appearance     Odor      Assessment & Plan:  1) Low-risk pregnancy G3P1011 at [redacted]w[redacted]d with an Estimated Date of Delivery: 01/12/19   2) Left hip pain, lortab #6 dispensed, take 1/2 tablet only if abolutely needed  seen in MAU yesterday Meds:  Meds ordered this encounter  Medications  . HYDROcodone-acetaminophen (LORTAB) 5-325 MG tablet    Sig: Take 1 tablet by mouth every 6 (six) hours as needed for moderate pain.    Dispense:  6 tablet    Refill:  0   Labs/procedures today:   Plan:  Continue routine obstetrical care   Reviewed: Term labor symptoms and general obstetric precautions including but not limited to vaginal bleeding, contractions, leaking of fluid and fetal movement were reviewed in detail with the patient.  All questions were answered.  home bp cuff. Rx faxed to . Check bp weekly, let us know if >140/90.   Follow-up: Return in about 1 week (around 01/01/2019) for [place].  Orders Placed This Encounter  Procedures  . GC/Chlamydia Probe Amp  . POC Urinalysis Dipstick OB   Mertie Clause Deyani Hegarty  12/25/2018 3:20 PM

## 2018-12-29 LAB — GC/CHLAMYDIA PROBE AMP
Chlamydia trachomatis, NAA: NEGATIVE
Neisseria Gonorrhoeae by PCR: NEGATIVE

## 2019-01-02 ENCOUNTER — Other Ambulatory Visit: Payer: Self-pay

## 2019-01-02 ENCOUNTER — Ambulatory Visit (INDEPENDENT_AMBULATORY_CARE_PROVIDER_SITE_OTHER): Payer: Medicaid Other | Admitting: Obstetrics & Gynecology

## 2019-01-02 ENCOUNTER — Encounter: Payer: Self-pay | Admitting: Obstetrics & Gynecology

## 2019-01-02 VITALS — BP 130/76 | HR 103 | Wt 211.0 lb

## 2019-01-02 DIAGNOSIS — Z3483 Encounter for supervision of other normal pregnancy, third trimester: Secondary | ICD-10-CM

## 2019-01-02 DIAGNOSIS — Z3A38 38 weeks gestation of pregnancy: Secondary | ICD-10-CM

## 2019-01-02 NOTE — Progress Notes (Signed)
   LOW-RISK PREGNANCY VISIT Patient name: Stacey Cook MRN [id number]  Date of birth: [birth year] Chief Complaint:   Routine Prenatal Visit  History of Present Illness:   Stacey Cook is a 23 y.o. G38P1011 female at [redacted]w[redacted]d with an Estimated Date of Delivery: 01/12/19 being seen today for ongoing management of a low-risk pregnancy.  Today she reports backache and pelvic pressure. Contractions: Regular. Vag. Bleeding: None.  Movement: Present. denies leaking of fluid. Review of Systems:   Pertinent items are noted in HPI Denies abnormal vaginal discharge w/ itching/odor/irritation, headaches, visual changes, shortness of breath, chest pain, abdominal pain, severe nausea/vomiting, or problems with urination or bowel movements unless otherwise stated above. Pertinent History Reviewed:  Reviewed past medical,surgical, social, obstetrical and family history.  Reviewed problem list, medications and allergies. Physical Assessment:   Vitals:   01/02/19 1449  BP: 130/76  Pulse: (!) 103  Weight: 211 lb (95.7 kg)  Body mass index is 42.62 kg/m.        Physical Examination:   General appearance: Well appearing, and in no distress  Mental status: Alert, oriented to person, place, and time  Skin: Warm & dry  Cardiovascular: Normal heart rate noted  Respiratory: Normal respiratory effort, no distress  Abdomen: Soft, gravid, nontender  Pelvic: Cervical exam deferred         Extremities: Edema: Trace  Fetal Status: Fetal Heart Rate (bpm): 145 Fundal Height: 40 cm Movement: Present    No results found for this or any previous visit (from the past 24 hour(s)).  Assessment & Plan:  1) Low-risk pregnancy G3P1011 at [redacted]w[redacted]d with an Estimated Date of Delivery: 01/12/19   2) Previous C section for repeat 01/08/2019,    Meds: No orders of the defined types were placed in this encounter.  Labs/procedures today:   Plan:  Continue routine obstetrical care   Reviewed: Term labor symptoms and general  obstetric precautions including but not limited to vaginal bleeding, contractions, leaking of fluid and fetal movement were reviewed in detail with the patient.  All questions were answered. Has home bp cuff. Rx faxed to . Check bp weekly, let us know if >140/90.   Follow-up: Return in about 1 week (around 01/09/2019) for [place].  No orders of the defined types were placed in this encounter.  [name] CNM, [facility] 01/02/2019 3:07 PM

## 2019-01-03 ENCOUNTER — Inpatient Hospital Stay (HOSPITAL_COMMUNITY)
Admission: AD | Admit: 2019-01-03 | Discharge: 2019-01-05 | DRG: 788 | Disposition: A | Discharge status: Home / Self Care | Payer: Medicaid Other | Attending: Family Medicine | Admitting: Family Medicine

## 2019-01-03 ENCOUNTER — Encounter (HOSPITAL_COMMUNITY): Payer: Self-pay | Admitting: *Deleted

## 2019-01-03 ENCOUNTER — Inpatient Hospital Stay (HOSPITAL_COMMUNITY): Payer: Medicaid Other | Admitting: Anesthesiology

## 2019-01-03 ENCOUNTER — Encounter (HOSPITAL_COMMUNITY)
Admission: AD | Disposition: A | Discharge status: Home / Self Care | Payer: Self-pay | Source: Home / Self Care | Attending: Family Medicine

## 2019-01-03 DIAGNOSIS — O99214 Obesity complicating childbirth: Secondary | ICD-10-CM | POA: Diagnosis present

## 2019-01-03 DIAGNOSIS — O99344 Other mental disorders complicating childbirth: Secondary | ICD-10-CM | POA: Diagnosis present

## 2019-01-03 DIAGNOSIS — H902 Conductive hearing loss, unspecified: Secondary | ICD-10-CM | POA: Diagnosis present

## 2019-01-03 DIAGNOSIS — O9902 Anemia complicating childbirth: Principal | ICD-10-CM | POA: Diagnosis present

## 2019-01-03 DIAGNOSIS — O9952 Diseases of the respiratory system complicating childbirth: Secondary | ICD-10-CM | POA: Diagnosis present

## 2019-01-03 DIAGNOSIS — F418 Other specified anxiety disorders: Secondary | ICD-10-CM | POA: Diagnosis present

## 2019-01-03 DIAGNOSIS — Z3A38 38 weeks gestation of pregnancy: Secondary | ICD-10-CM

## 2019-01-03 DIAGNOSIS — Z20828 Contact with and (suspected) exposure to other viral communicable diseases: Secondary | ICD-10-CM | POA: Diagnosis present

## 2019-01-03 DIAGNOSIS — D649 Anemia, unspecified: Secondary | ICD-10-CM | POA: Diagnosis present

## 2019-01-03 DIAGNOSIS — O26893 Other specified pregnancy related conditions, third trimester: Secondary | ICD-10-CM | POA: Diagnosis present

## 2019-01-03 DIAGNOSIS — O09293 Supervision of pregnancy with other poor reproductive or obstetric history, third trimester: Secondary | ICD-10-CM | POA: Diagnosis not present

## 2019-01-03 DIAGNOSIS — J4520 Mild intermittent asthma, uncomplicated: Secondary | ICD-10-CM | POA: Diagnosis present

## 2019-01-03 DIAGNOSIS — Z98891 History of uterine scar from previous surgery: Secondary | ICD-10-CM

## 2019-01-03 DIAGNOSIS — Z87891 Personal history of nicotine dependence: Secondary | ICD-10-CM | POA: Diagnosis not present

## 2019-01-03 DIAGNOSIS — O99824 Streptococcus B carrier state complicating childbirth: Secondary | ICD-10-CM

## 2019-01-03 LAB — CBC
HCT: 33.5 % — ABNORMAL LOW (ref 36.0–46.0)
Hemoglobin: 10.3 g/dL — ABNORMAL LOW (ref 12.0–15.0)
MCH: 26.2 pg (ref 26.0–34.0)
MCHC: 30.7 g/dL (ref 30.0–36.0)
MCV: 85.2 fL (ref 80.0–100.0)
Platelets: 187 10*3/uL (ref 150–400)
RBC: 3.93 MIL/uL (ref 3.87–5.11)
RDW: 17.4 % — ABNORMAL HIGH (ref 11.5–15.5)
WBC: 11.6 10*3/uL — ABNORMAL HIGH (ref 4.0–10.5)
nRBC: 0 % (ref 0.0–0.2)

## 2019-01-03 LAB — SARS CORONAVIRUS 2 BY RT PCR (HOSPITAL ORDER, PERFORMED IN ~~LOC~~ HOSPITAL LAB): SARS Coronavirus 2: NEGATIVE

## 2019-01-03 LAB — ABO/RH: ABO/RH(D): A POS

## 2019-01-03 LAB — RPR: RPR Ser Ql: NONREACTIVE

## 2019-01-03 LAB — PREPARE RBC (CROSSMATCH)

## 2019-01-03 SURGERY — Surgical Case
Anesthesia: Spinal | Wound class: Clean Contaminated

## 2019-01-03 MED ORDER — ZOLPIDEM TARTRATE 5 MG PO TABS: 5.0000 mg | ORAL_TABLET | Freq: Every evening | ORAL | Status: DC | PRN

## 2019-01-03 MED ORDER — METOCLOPRAMIDE HCL 5 MG/ML IJ SOLN
INTRAMUSCULAR | Status: AC
  Filled 2019-01-03: qty 2

## 2019-01-03 MED ORDER — MEPERIDINE HCL 25 MG/ML IJ SOLN
INTRAMUSCULAR | Status: AC
  Filled 2019-01-03: qty 1

## 2019-01-03 MED ORDER — ACETAMINOPHEN 500 MG PO TABS
1000.0000 mg | ORAL_TABLET | Freq: Four times a day (QID) | ORAL | Status: AC
  Administered 2019-01-03 (×3): 1000 mg via ORAL
  Filled 2019-01-03 (×3): qty 2

## 2019-01-03 MED ORDER — ONDANSETRON HCL 4 MG/2ML IJ SOLN: 4.0000 mg | Freq: Three times a day (TID) | INTRAMUSCULAR | Status: DC | PRN

## 2019-01-03 MED ORDER — NALOXONE HCL 0.4 MG/ML IJ SOLN: 0.4000 mg | INTRAMUSCULAR | Status: DC | PRN

## 2019-01-03 MED ORDER — DIPHENHYDRAMINE HCL 50 MG/ML IJ SOLN
INTRAMUSCULAR | Status: AC
  Filled 2019-01-03: qty 1

## 2019-01-03 MED ORDER — NALBUPHINE HCL 10 MG/ML IJ SOLN: 5.0000 mg | INTRAMUSCULAR | Status: DC | PRN

## 2019-01-03 MED ORDER — KETOROLAC TROMETHAMINE 30 MG/ML IJ SOLN
30.0000 mg | Freq: Four times a day (QID) | INTRAMUSCULAR | Status: AC
  Administered 2019-01-03 – 2019-01-04 (×4): 30 mg via INTRAVENOUS
  Filled 2019-01-03 (×4): qty 1

## 2019-01-03 MED ORDER — SCOPOLAMINE 1 MG/3DAYS TD PT72
MEDICATED_PATCH | TRANSDERMAL | Status: AC
  Filled 2019-01-03: qty 1

## 2019-01-03 MED ORDER — ONDANSETRON HCL 4 MG/2ML IJ SOLN
INTRAMUSCULAR | Status: AC
  Filled 2019-01-03: qty 2

## 2019-01-03 MED ORDER — TERBUTALINE SULFATE 1 MG/ML IJ SOLN
INTRAMUSCULAR | Status: AC
  Administered 2019-01-03: 0.25 mg via SUBCUTANEOUS
  Filled 2019-01-03: qty 1

## 2019-01-03 MED ORDER — ACETAMINOPHEN 10 MG/ML IV SOLN: 1000.0000 mg | Freq: Once | INTRAVENOUS | Status: DC | PRN

## 2019-01-03 MED ORDER — IBUPROFEN 800 MG PO TABS
800.0000 mg | ORAL_TABLET | Freq: Four times a day (QID) | ORAL | Status: DC
  Administered 2019-01-04 – 2019-01-05 (×4): 800 mg via ORAL
  Filled 2019-01-03 (×5): qty 1

## 2019-01-03 MED ORDER — NALBUPHINE HCL 10 MG/ML IJ SOLN: 5.0000 mg | Freq: Once | INTRAMUSCULAR | Status: DC | PRN

## 2019-01-03 MED ORDER — SENNOSIDES-DOCUSATE SODIUM 8.6-50 MG PO TABS
2.0000 | ORAL_TABLET | ORAL | Status: DC
  Administered 2019-01-03 – 2019-01-04 (×2): 2 via ORAL
  Filled 2019-01-03 (×2): qty 2

## 2019-01-03 MED ORDER — SODIUM CHLORIDE 0.9% FLUSH: 3.0000 mL | INTRAVENOUS | Status: DC | PRN

## 2019-01-03 MED ORDER — SODIUM CHLORIDE 0.9 % IR SOLN
Status: DC | PRN
  Administered 2019-01-03: 1

## 2019-01-03 MED ORDER — TERBUTALINE SULFATE 1 MG/ML IJ SOLN
0.2500 mg | Freq: Once | INTRAMUSCULAR | Status: AC
  Administered 2019-01-03: 05:00:00 0.25 mg via SUBCUTANEOUS

## 2019-01-03 MED ORDER — DIPHENHYDRAMINE HCL 50 MG/ML IJ SOLN
12.5000 mg | INTRAMUSCULAR | Status: DC | PRN
  Administered 2019-01-03: 08:00:00 12.5 mg via INTRAVENOUS

## 2019-01-03 MED ORDER — DIPHENHYDRAMINE HCL 25 MG PO CAPS
25.0000 mg | ORAL_CAPSULE | Freq: Four times a day (QID) | ORAL | Status: DC | PRN
  Administered 2019-01-04: 14:00:00 25 mg via ORAL

## 2019-01-03 MED ORDER — LACTATED RINGERS IV SOLN
INTRAVENOUS | Status: DC | PRN
  Administered 2019-01-03 (×2): via INTRAVENOUS

## 2019-01-03 MED ORDER — SIMETHICONE 80 MG PO CHEW: 80.0000 mg | CHEWABLE_TABLET | ORAL | Status: DC | PRN

## 2019-01-03 MED ORDER — KETOROLAC TROMETHAMINE 30 MG/ML IJ SOLN: 30.0000 mg | Freq: Four times a day (QID) | INTRAMUSCULAR | Status: AC | PRN

## 2019-01-03 MED ORDER — TRANEXAMIC ACID-NACL 1000-0.7 MG/100ML-% IV SOLN
1000.0000 mg | INTRAVENOUS | Status: AC
  Administered 2019-01-03: 06:00:00 1000 mg via INTRAVENOUS

## 2019-01-03 MED ORDER — MENTHOL 3 MG MT LOZG
1.0000 | LOZENGE | OROMUCOSAL | Status: DC | PRN
  Administered 2019-01-03 – 2019-01-05 (×2): 3 mg via ORAL
  Filled 2019-01-03 (×2): qty 9

## 2019-01-03 MED ORDER — OXYCODONE-ACETAMINOPHEN 5-325 MG PO TABS
1.0000 | ORAL_TABLET | ORAL | Status: DC | PRN
  Administered 2019-01-04 (×2): 2 via ORAL
  Administered 2019-01-04: 1 via ORAL
  Administered 2019-01-05: 2 via ORAL
  Filled 2019-01-03: qty 2
  Filled 2019-01-03: qty 1
  Filled 2019-01-03 (×2): qty 2

## 2019-01-03 MED ORDER — FENTANYL CITRATE (PF) 100 MCG/2ML IJ SOLN
INTRAMUSCULAR | Status: DC | PRN
  Administered 2019-01-03: 100 ug via INTRAVENOUS
  Administered 2019-01-03: 15 ug via INTRATHECAL

## 2019-01-03 MED ORDER — METOCLOPRAMIDE HCL 5 MG/ML IJ SOLN
INTRAMUSCULAR | Status: DC | PRN
  Administered 2019-01-03: 10 mg via INTRAVENOUS

## 2019-01-03 MED ORDER — SOD CITRATE-CITRIC ACID 500-334 MG/5ML PO SOLN
30.0000 mL | Freq: Once | ORAL | Status: AC
  Administered 2019-01-03: 30 mL via ORAL
  Filled 2019-01-03: qty 30

## 2019-01-03 MED ORDER — GENTAMICIN SULFATE 40 MG/ML IJ SOLN
300.0000 mg | INTRAVENOUS | Status: AC
  Administered 2019-01-03: 06:00:00 300 mg via INTRAVENOUS
  Filled 2019-01-03: qty 7.5

## 2019-01-03 MED ORDER — SODIUM CHLORIDE 0.9 % IV SOLN
INTRAVENOUS | Status: DC | PRN
  Administered 2019-01-03: 06:00:00 via INTRAVENOUS

## 2019-01-03 MED ORDER — PRENATAL MULTIVITAMIN CH
1.0000 | ORAL_TABLET | Freq: Every day | ORAL | Status: DC
  Administered 2019-01-03 – 2019-01-05 (×3): 1 via ORAL
  Filled 2019-01-03 (×3): qty 1

## 2019-01-03 MED ORDER — ESCITALOPRAM OXALATE 20 MG PO TABS
20.0000 mg | ORAL_TABLET | Freq: Every day | ORAL | Status: DC
  Administered 2019-01-03 – 2019-01-05 (×3): 20 mg via ORAL
  Filled 2019-01-03 (×3): qty 1

## 2019-01-03 MED ORDER — ENOXAPARIN SODIUM 60 MG/0.6ML ~~LOC~~ SOLN
50.0000 mg | SUBCUTANEOUS | Status: DC
  Administered 2019-01-04 – 2019-01-05 (×2): 50 mg via SUBCUTANEOUS
  Filled 2019-01-03 (×2): qty 0.6

## 2019-01-03 MED ORDER — PHENYLEPHRINE HCL-NACL 20-0.9 MG/250ML-% IV SOLN
INTRAVENOUS | Status: AC
  Filled 2019-01-03: qty 250

## 2019-01-03 MED ORDER — MEPERIDINE HCL 25 MG/ML IJ SOLN
INTRAMUSCULAR | Status: DC | PRN
  Administered 2019-01-03: 12.5 mg via INTRAVENOUS

## 2019-01-03 MED ORDER — COCONUT OIL OIL: 1.0000 "application " | TOPICAL_OIL | Status: DC | PRN

## 2019-01-03 MED ORDER — MORPHINE SULFATE (PF) 0.5 MG/ML IJ SOLN
INTRAMUSCULAR | Status: DC | PRN
  Administered 2019-01-03: .15 mg via INTRATHECAL

## 2019-01-03 MED ORDER — CLINDAMYCIN PHOSPHATE 900 MG/50ML IV SOLN
INTRAVENOUS | Status: AC
  Filled 2019-01-03: qty 50

## 2019-01-03 MED ORDER — SCOPOLAMINE 1 MG/3DAYS TD PT72: 1.0000 | MEDICATED_PATCH | Freq: Once | TRANSDERMAL | Status: DC

## 2019-01-03 MED ORDER — SODIUM CHLORIDE 0.9 % IV SOLN
INTRAVENOUS | Status: DC | PRN
  Administered 2019-01-03: 07:00:00 40 [IU] via INTRAVENOUS

## 2019-01-03 MED ORDER — DEXAMETHASONE SODIUM PHOSPHATE 10 MG/ML IJ SOLN
INTRAMUSCULAR | Status: AC
  Filled 2019-01-03: qty 1

## 2019-01-03 MED ORDER — DIBUCAINE (PERIANAL) 1 % EX OINT: 1.0000 "application " | TOPICAL_OINTMENT | CUTANEOUS | Status: DC | PRN

## 2019-01-03 MED ORDER — PHENYLEPHRINE HCL-NACL 20-0.9 MG/250ML-% IV SOLN
INTRAVENOUS | Status: DC | PRN
  Administered 2019-01-03: 80 ug/min via INTRAVENOUS

## 2019-01-03 MED ORDER — FERROUS SULFATE 325 (65 FE) MG PO TABS
325.0000 mg | ORAL_TABLET | Freq: Two times a day (BID) | ORAL | Status: DC
  Administered 2019-01-03 – 2019-01-05 (×4): 325 mg via ORAL
  Filled 2019-01-03 (×4): qty 1

## 2019-01-03 MED ORDER — MORPHINE SULFATE (PF) 0.5 MG/ML IJ SOLN
INTRAMUSCULAR | Status: AC
  Filled 2019-01-03: qty 10

## 2019-01-03 MED ORDER — DEXAMETHASONE SODIUM PHOSPHATE 10 MG/ML IJ SOLN
INTRAMUSCULAR | Status: DC | PRN
  Administered 2019-01-03: 10 mg via INTRAVENOUS

## 2019-01-03 MED ORDER — NALBUPHINE HCL 10 MG/ML IJ SOLN
5.0000 mg | INTRAMUSCULAR | Status: DC | PRN
  Administered 2019-01-03: 5 mg via INTRAVENOUS
  Filled 2019-01-03: qty 1

## 2019-01-03 MED ORDER — ONDANSETRON HCL 4 MG/2ML IJ SOLN
INTRAMUSCULAR | Status: DC | PRN
  Administered 2019-01-03: 4 mg via INTRAVENOUS

## 2019-01-03 MED ORDER — BUPIVACAINE IN DEXTROSE 0.75-8.25 % IT SOLN
INTRATHECAL | Status: DC | PRN
  Administered 2019-01-03: 1.8 mg via INTRATHECAL

## 2019-01-03 MED ORDER — SIMETHICONE 80 MG PO CHEW
80.0000 mg | CHEWABLE_TABLET | ORAL | Status: DC
  Administered 2019-01-03 – 2019-01-04 (×2): 80 mg via ORAL
  Filled 2019-01-03 (×2): qty 1

## 2019-01-03 MED ORDER — SCOPOLAMINE 1 MG/3DAYS TD PT72
MEDICATED_PATCH | TRANSDERMAL | Status: DC | PRN
  Administered 2019-01-03: 1 via TRANSDERMAL

## 2019-01-03 MED ORDER — KETOROLAC TROMETHAMINE 30 MG/ML IJ SOLN: 30.0000 mg | Freq: Once | INTRAMUSCULAR | Status: DC | PRN

## 2019-01-03 MED ORDER — OXYTOCIN 40 UNITS IN NORMAL SALINE INFUSION - SIMPLE MED: 2.5000 [IU]/h | INTRAVENOUS | Status: AC

## 2019-01-03 MED ORDER — CLINDAMYCIN PHOSPHATE 900 MG/50ML IV SOLN
900.0000 mg | INTRAVENOUS | Status: AC
  Administered 2019-01-03: 06:00:00 900 mg via INTRAVENOUS

## 2019-01-03 MED ORDER — LACTATED RINGERS IV BOLUS
1000.0000 mL | Freq: Once | INTRAVENOUS | Status: AC
  Administered 2019-01-03: 04:00:00 1000 mL via INTRAVENOUS

## 2019-01-03 MED ORDER — FENTANYL CITRATE (PF) 100 MCG/2ML IJ SOLN
INTRAMUSCULAR | Status: AC
  Filled 2019-01-03: qty 2

## 2019-01-03 MED ORDER — NALOXONE HCL 4 MG/10ML IJ SOLN
1.0000 ug/kg/h | INTRAVENOUS | Status: DC | PRN
  Filled 2019-01-03: qty 5

## 2019-01-03 MED ORDER — DIPHENHYDRAMINE HCL 25 MG PO CAPS
25.0000 mg | ORAL_CAPSULE | ORAL | Status: DC | PRN
  Administered 2019-01-03 (×2): 25 mg via ORAL
  Filled 2019-01-03 (×3): qty 1

## 2019-01-03 MED ORDER — STERILE WATER FOR IRRIGATION IR SOLN
Status: DC | PRN
  Administered 2019-01-03: 1

## 2019-01-03 MED ORDER — LACTATED RINGERS IV SOLN
INTRAVENOUS | Status: DC
  Administered 2019-01-03 – 2019-01-04 (×3): via INTRAVENOUS

## 2019-01-03 MED ORDER — SIMETHICONE 80 MG PO CHEW
80.0000 mg | CHEWABLE_TABLET | Freq: Three times a day (TID) | ORAL | Status: DC
  Administered 2019-01-03 – 2019-01-05 (×6): 80 mg via ORAL
  Filled 2019-01-03 (×6): qty 1

## 2019-01-03 MED ORDER — TETANUS-DIPHTH-ACELL PERTUSSIS 5-2.5-18.5 LF-MCG/0.5 IM SUSP: 0.5000 mL | Freq: Once | INTRAMUSCULAR | Status: DC

## 2019-01-03 MED ORDER — FAMOTIDINE IN NACL 20-0.9 MG/50ML-% IV SOLN
20.0000 mg | Freq: Once | INTRAVENOUS | Status: AC
  Administered 2019-01-03: 05:00:00 20 mg via INTRAVENOUS
  Filled 2019-01-03: qty 50

## 2019-01-03 MED ORDER — WITCH HAZEL-GLYCERIN EX PADS: 1.0000 "application " | MEDICATED_PAD | CUTANEOUS | Status: DC | PRN

## 2019-01-03 SURGICAL SUPPLY — 42 items
BENZOIN TINCTURE PRP APPL 2/3 (GAUZE/BANDAGES/DRESSINGS) ×3 IMPLANT
CHLORAPREP W/TINT 26ML (MISCELLANEOUS) ×3 IMPLANT
CLAMP CORD UMBIL (MISCELLANEOUS) IMPLANT
CLOSURE WOUND 1/4 X3 (GAUZE/BANDAGES/DRESSINGS) ×1
CLOTH BEACON ORANGE TIMEOUT ST (SAFETY) ×3 IMPLANT
DERMABOND ADVANCED (GAUZE/BANDAGES/DRESSINGS) ×4
DERMABOND ADVANCED .7 DNX12 (GAUZE/BANDAGES/DRESSINGS) ×2 IMPLANT
DRSG OPSITE POSTOP 4X10 (GAUZE/BANDAGES/DRESSINGS) ×3 IMPLANT
ELECT REM PT RETURN 9FT ADLT (ELECTROSURGICAL) ×3
ELECTRODE REM PT RTRN 9FT ADLT (ELECTROSURGICAL) ×1 IMPLANT
EXTRACTOR VACUUM BELL STYLE (SUCTIONS) IMPLANT
GAUZE SPONGE 4X4 12PLY STRL LF (GAUZE/BANDAGES/DRESSINGS) ×6 IMPLANT
GLOVE BIOGEL PI IND STRL 7.0 (GLOVE) ×1 IMPLANT
GLOVE BIOGEL PI IND STRL 8 (GLOVE) ×1 IMPLANT
GLOVE BIOGEL PI INDICATOR 7.0 (GLOVE) ×2
GLOVE BIOGEL PI INDICATOR 8 (GLOVE) ×2
GLOVE ECLIPSE 8.0 STRL XLNG CF (GLOVE) ×3 IMPLANT
GOWN STRL REUS W/TWL LRG LVL3 (GOWN DISPOSABLE) ×6 IMPLANT
KIT ABG SYR 3ML LUER SLIP (SYRINGE) ×3 IMPLANT
NEEDLE HYPO 18GX1.5 BLUNT FILL (NEEDLE) ×3 IMPLANT
NEEDLE HYPO 22GX1.5 SAFETY (NEEDLE) ×3 IMPLANT
NEEDLE HYPO 25X5/8 SAFETYGLIDE (NEEDLE) ×3 IMPLANT
NS IRRIG 1000ML POUR BTL (IV SOLUTION) ×3 IMPLANT
PACK C SECTION WH (CUSTOM PROCEDURE TRAY) ×3 IMPLANT
PAD ABD 7.5X8 STRL (GAUZE/BANDAGES/DRESSINGS) ×3 IMPLANT
PAD OB MATERNITY 4.3X12.25 (PERSONAL CARE ITEMS) ×3 IMPLANT
PENCIL SMOKE EVAC W/HOLSTER (ELECTROSURGICAL) ×3 IMPLANT
RTRCTR C-SECT PINK 25CM LRG (MISCELLANEOUS) IMPLANT
STRIP CLOSURE SKIN 1/4X3 (GAUZE/BANDAGES/DRESSINGS) ×2 IMPLANT
SUT CHROMIC 0 CT 1 (SUTURE) ×3 IMPLANT
SUT MNCRL 0 VIOLET CTX 36 (SUTURE) ×2 IMPLANT
SUT MONOCRYL 0 CTX 36 (SUTURE) ×4
SUT PLAIN 2 0 (SUTURE)
SUT PLAIN 2 0 XLH (SUTURE) IMPLANT
SUT PLAIN ABS 2-0 CT1 27XMFL (SUTURE) IMPLANT
SUT VIC AB 0 CTX 36 (SUTURE) ×2
SUT VIC AB 0 CTX36XBRD ANBCTRL (SUTURE) ×1 IMPLANT
SUT VIC AB 4-0 KS 27 (SUTURE) IMPLANT
SYR 20CC LL (SYRINGE) ×6 IMPLANT
TOWEL OR 17X24 6PK STRL BLUE (TOWEL DISPOSABLE) ×3 IMPLANT
TRAY FOLEY W/BAG SLVR 14FR LF (SET/KITS/TRAYS/PACK) IMPLANT
WATER STERILE IRR 1000ML POUR (IV SOLUTION) ×3 IMPLANT

## 2019-01-03 NOTE — Transfer of Care (Signed)
Immediate Anesthesia Transfer of Care Note  Patient: Stacey Cook  Procedure(s) Performed: CESAREAN SECTION (N/A )  Patient Location: PACU  Anesthesia Type:Spinal  Level of Consciousness: awake, alert  and oriented  Airway & Oxygen Therapy: Patient Spontanous Breathing  Post-op Assessment: Report given to RN and Post -op Vital signs reviewed and stable  Post vital signs: Reviewed and stable  Last Vitals:  Vitals Value Taken Time  BP 105/55 01/03/19 0722  Temp    Pulse 86 01/03/19 0727  Resp 21 01/03/19 0727  SpO2 96 % 01/03/19 0727  Vitals shown include unvalidated device data.  Last Pain:  Vitals:   01/03/19 0404  TempSrc: Oral  PainSc: 10-Worst pain ever      Patients Stated Pain Goal: 0 ([id number])  Complications: No apparent anesthesia complications

## 2019-01-03 NOTE — Anesthesia Postprocedure Evaluation (Signed)
Anesthesia Post Note  Patient: Stacey Cook  Procedure(s) Performed: CESAREAN SECTION (N/A )     Patient location during evaluation: PACU Anesthesia Type: Spinal Level of consciousness: oriented and awake and alert Pain management: pain level controlled Vital Signs Assessment: post-procedure vital signs reviewed and stable Respiratory status: spontaneous breathing, respiratory function stable, patient connected to nasal cannula oxygen and nonlabored ventilation Cardiovascular status: blood pressure returned to baseline and stable Postop Assessment: no headache, no backache, no apparent nausea or vomiting and spinal receding Anesthetic complications: no    Last Vitals:  Vitals:   01/03/19 0900 01/03/19 0925  BP: (!) 113/56 (!) 115/91  Pulse: 81 86  Resp: 16 18  Temp:  36.8 C  SpO2: 97% 99%    Last Pain:  Vitals:   01/03/19 0925  TempSrc: Oral  PainSc: 0-No pain   Pain Goal: Patients Stated Pain Goal: 0 (01/03/19 0404)  LLE Motor Response: Purposeful movement (01/03/19 0913) LLE Sensation: Tingling (01/03/19 0913) RLE Motor Response: Purposeful movement (01/03/19 0913) RLE Sensation: Tingling (01/03/19 0913)     Epidural/Spinal Function Cutaneous sensation: Normal sensation (01/03/19 0925), Patient able to flex knees: Yes (01/03/19 0925), Patient able to lift hips off bed: Yes (01/03/19 0925), Back pain beyond tenderness at insertion site: No (01/03/19 0925), Progressively worsening motor and/or sensory loss: No (01/03/19 0925), Bowel and/or bladder incontinence post epidural: No (01/03/19 0925)  Catalina Gravel

## 2019-01-03 NOTE — Discharge Summary (Signed)
Postpartum Discharge Summary     Patient Name: Stacey Cook DOB: 29-Apr-1996 MRN: [id number]  Date of admission: 01/03/2019 Delivering Provider: Truett Mainland   Date of discharge: 01/05/2019  Admitting diagnosis: 33WKS CTX Intrauterine pregnancy: [redacted]w[redacted]d     Secondary diagnosis:  Active Problems:   S/P cesarean section   Status post cesarean section  Additional problems: history of postpartum hemorrhage, history of shoulder dystocia     Discharge diagnosis: Term Pregnancy Delivered                                                                                                Post partum procedures:None  Augmentation: n/a  Complications: None  Hospital course:  Sceduled C/S   23 y.o. yo G3P1011 at [redacted]w[redacted]d was presented to the hospital 01/03/2019 in labor and was taken to cesarean section with the following indication:previous shoulder dystocia.  Membrane Rupture Time/Date: 6:28 AM ,01/03/2019   Patient delivered a Viable infant.01/03/2019  Details of operation can be found in separate operative note.  Pateint had an uncomplicated postpartum course.  She is ambulating, tolerating a regular diet, passing flatus, and urinating well. Patient is discharged home in stable condition on  01/05/19         Magnesium Sulfate recieved: No BMZ received: No  Physical exam  Vitals:   01/04/19 0527 01/04/19 1530 01/04/19 2150 01/05/19 0500  BP: 123/69 (!) 146/85 104/62 (!) 113/55  Pulse: 77 78 89 91  Resp: 18 18 17 18   Temp: 97.9 F (36.6 C) 98.3 F (36.8 C) 98.4 F (36.9 C) 98 F (36.7 C)  TempSrc: Oral Oral Oral Oral  SpO2: 100% 95%  97%   General: alert, cooperative and no distress Lochia: appropriate Uterine Fundus: firm Incision: Healing well with no significant drainage, No significant erythema DVT Evaluation: No evidence of DVT seen on physical exam. Labs: Lab Results  Component Value Date   WBC 8.9 01/05/2019   HGB 8.7 (L) 01/05/2019   HCT 28.2 (L) 01/05/2019   MCV  86.0 01/05/2019   PLT 163 01/05/2019   CMP Latest Ref Rng & Units 12/24/2018  Glucose 70 - 99 mg/dL 105(H)  BUN 6 - 20 mg/dL 7  Creatinine 0.44 - 1.00 mg/dL 0.36(L)  Sodium 135 - 145 mmol/L 135  Potassium 3.5 - 5.1 mmol/L 3.7  Chloride 98 - 111 mmol/L 105  CO2 22 - 32 mmol/L 21(L)  Calcium 8.9 - 10.3 mg/dL 9.6  Total Protein 6.5 - 8.1 g/dL 6.6  Total Bilirubin 0.3 - 1.2 mg/dL 0.4  Alkaline Phos 38 - 126 U/L 160(H)  AST 15 - 41 U/L 18  ALT 0 - 44 U/L 17    Discharge instruction: per After Visit Summary and "Baby and Me Booklet".  After visit meds:  Allergies as of 01/05/2019      Reactions   Keflex [cephalexin] Other (See Comments)   Reaction:  Elevated liver enzymes    Other Other (See Comments)   Pt is allergic to surgical glue swelling;  Cats-congestion, sneezing, eyes watery, itching all over Reaction:  Infection    Adhesive [  tape] Rash      Medication List    STOP taking these medications   HYDROcodone-acetaminophen 5-325 MG tablet Commonly known as: Lortab     TAKE these medications   acetaminophen 500 MG tablet Commonly known as: TYLENOL Take 500 mg by mouth as needed.   Blood Pressure Monitor Automat Devi Take BP at home per provider instructions   cyclobenzaprine 10 MG tablet Commonly known as: FLEXERIL Take 1 tablet (10 mg total) by mouth 3 (three) times daily as needed for muscle spasms. May use 1/2 tablet for mild back pain.   escitalopram 20 MG tablet Commonly known as: Lexapro Take 1 tablet (20 mg total) by mouth daily.   esomeprazole 20 MG capsule Commonly known as: NEXIUM Take 1 capsule (20 mg total) by mouth daily at 12 noon.   ferrous sulfate 325 (65 FE) MG tablet Take 1 tablet (325 mg total) by mouth 2 (two) times daily with a meal.   ibuprofen 800 MG tablet Commonly known as: ADVIL Take 1 tablet (800 mg total) by mouth every 6 (six) hours.   metroNIDAZOLE 500 MG tablet Commonly known as: FLAGYL Take 1 tablet (500 mg total) by mouth  2 (two) times daily.   oxyCODONE-acetaminophen 5-325 MG tablet Commonly known as: PERCOCET/ROXICET Take 1-2 tablets by mouth every 4 (four) hours as needed for moderate pain.   prenatal vitamin w/FE, FA 27-1 MG Tabs tablet Take 1 tablet by mouth daily at 12 noon.   terconazole 0.4 % vaginal cream Commonly known as: TERAZOL 7 Place 1 applicator vaginally at bedtime.       Diet: routine diet  Activity: Advance as tolerated. Pelvic rest for 6 weeks.   Outpatient follow up:2 weeks Follow up Appt: Future Appointments  Date Time Provider [place]  01/12/2019 11:10 AM [name], MD [facility]   Follow up Visit: [place]. Schedule an appointment as soon as possible for a visit in 2 week(s).   Why: to check incision Contact information: [address] [place] [id number]           Please schedule this patient for Postpartum visit in: 4 weeks with the following provider: Any provider For C/S patients schedule nurse incision check in weeks 2 weeks: yes High risk pregnancy complicated by: ADHD, asthma, conductive hearing loss Delivery mode:  CS Anticipated Birth Control:  Depo or IUD PP Procedures needed: none  Schedule Integrated BH visit: no      Newborn Data: Live born female  Birth Weight:   APGAR: 82, 9  Newborn Delivery   Birth date/time: 01/03/2019 06:29:00 Delivery type: C-Section, Low Transverse Trial of labor: No C-section categorization: Primary      Baby Feeding: Breast Disposition:home with mother   01/05/2019 Hansel Feinstein, CNM

## 2019-01-03 NOTE — MAU Note (Signed)
Pt reports to MAU via EMS c/o ctx every 3 min. No bleeding or LOF. +FM.

## 2019-01-03 NOTE — Anesthesia Procedure Notes (Signed)
Spinal  Patient location during procedure: OR Start time: 01/03/2019 6:02 AM End time: 01/03/2019 6:12 AM Staffing Anesthesiologist: Freddrick March, MD Performed: anesthesiologist  Preanesthetic Checklist Completed: patient identified, surgical consent, pre-op evaluation, timeout performed, IV checked, risks and benefits discussed and monitors and equipment checked Spinal Block Patient position: sitting Prep: site prepped and draped and DuraPrep Patient monitoring: cardiac monitor, continuous pulse ox and blood pressure Approach: midline Location: L3-4 Injection technique: single-shot Needle Needle type: Pencan  Needle gauge: 24 G Needle length: 9 cm Assessment Sensory level: T6 Additional Notes Functioning IV was confirmed and monitors were applied. Sterile prep and drape, including hand hygiene and sterile gloves were used. The patient was positioned and the spine was prepped. The skin was anesthetized with lidocaine.  Free flow of clear CSF was obtained prior to injecting local anesthetic into the CSF.  The spinal needle aspirated freely following injection.  The needle was carefully withdrawn.  The patient tolerated the procedure well.

## 2019-01-03 NOTE — Lactation Note (Signed)
This note was copied from a baby's chart. Lactation Consultation Note  Patient Name: Stacey Cook [id number] Date: 01/03/2019   P2, 40 7 hours old.  She states she formula fed her first child because he did not latch.  She states she has a family history of low milk supply but when mother hand expressed she had a good flow. Mother latched baby in cradle hold on both breasts with intermittent swallows. Mother states she wants to breastfeed and formula feed. Suggest mother breastfeed before offering formula to help establish her milk supply.  Mother states she would like to formula feed during the night. Stressed the importance of feeding on demand. Feed on demand approximately 8-12 times per day.   Lactation brochure given to mother.   Maternal Data    Feeding Feeding Type: Breast Fed  LATCH Score Latch: Repeated attempts needed to sustain latch, nipple held in mouth throughout feeding, stimulation needed to elicit sucking reflex.  Audible Swallowing: A few with stimulation  Type of Nipple: Everted at rest and after stimulation  Comfort (Breast/Nipple): Soft / non-tender  Hold (Positioning): Assistance needed to correctly position infant at breast and maintain latch.  LATCH Score: 7  Interventions    Lactation Tools Discussed/Used     Consult Status      Stacey Cook [facility] 01/03/2019, 2:21 PM

## 2019-01-03 NOTE — MAU Provider Note (Signed)
Patient Name: Stacey Cook, female   DOB: 1995/12/06, 23 y.o.  MRN: [id number]  Patient contracting through terbutaline and changing to 7cm. At this point, this should be considered an emergency and we will proceed for cesarean delivery without waiting for NPO status or COVID testing to be complete.  Truett Mainland, DO 01/03/2019 5:52 AM

## 2019-01-03 NOTE — Anesthesia Preprocedure Evaluation (Signed)
Anesthesia Evaluation  Patient identified by MRN, date of birth, ID band Patient awake    Reviewed: Allergy & Precautions, NPO status , Patient's Chart, lab work & pertinent test results  History of Anesthesia Complications (+) PONV and history of anesthetic complications  Airway Mallampati: III  TM Distance: >3 FB Neck ROM: Full    Dental no notable dental hx.    Pulmonary asthma , former smoker,    Pulmonary exam normal breath sounds clear to auscultation       Cardiovascular negative cardio ROS Normal cardiovascular exam Rhythm:Regular Rate:Normal     Neuro/Psych  Headaches, PSYCHIATRIC DISORDERS Anxiety Depression    GI/Hepatic Neg liver ROS, GERD  Medicated,  Endo/Other  diabetes (h/o gDM in previous pregnancy)Morbid obesity  Renal/GU negative Renal ROS  negative genitourinary   Musculoskeletal  (+) Arthritis ,   Abdominal   Peds  Hematology  (+) Blood dyscrasia (Hgb 10.6), anemia ,   Anesthesia Other Findings Primary C/S for h/o shoulder dystocia and PPH in previous pregnancy. Last ate solids at 2am; however, pt contracting and making cervical change despite terbutaline.   Reproductive/Obstetrics (+) Pregnancy                             Anesthesia Physical Anesthesia Plan  ASA: III and emergent  Anesthesia Plan: Spinal   Post-op Pain Management:    Induction:   PONV Risk Score and Plan: 3 and Treatment may vary due to age or medical condition  Airway Management Planned: Natural Airway  Additional Equipment:   Intra-op Plan:   Post-operative Plan:   Informed Consent: I have reviewed the patients History and Physical, chart, labs and discussed the procedure including the risks, benefits and alternatives for the proposed anesthesia with the patient or authorized representative who has indicated his/her understanding and acceptance.     Dental advisory given  Plan  Discussed with: CRNA  Anesthesia Plan Comments:         Anesthesia Quick Evaluation

## 2019-01-03 NOTE — MAU Note (Signed)
OB OR charge nurse notified of pt and she stated she will notify the Anesthesiologist. House coverage notified.   Dr. Nehemiah Settle at bedside @0420 .  MB charge nurse notified.

## 2019-01-03 NOTE — H&P (Signed)
Faculty Practice H&P  Stacey Cook is a 24 y.o. female [id number] with IUP at [redacted]w[redacted]d presenting for primary cesarean section for previous shoulder dystocia at 83 weeks with a 3311g baby. Pregnancy was been complicated by GBS bacteriuria.    Pt states she has been having contractions every 2 minutes, no vaginal bleeding, intact membranes, with normal fetal movement.     Prenatal Course Source of Care: [facility] with onset of care at 8weeks  Pregnancy complications or risks: Patient Active Problem List   Diagnosis Date Noted  . S/P cesarean section 01/03/2019  . Group B streptococcal bacteriuria 10/31/2018  . Ovarian cyst 07/04/2018  . Supervision of normal pregnancy 06/06/2018  . Short interval between pregnancies affecting pregnancy in first trimester, antepartum 05/19/2018  . 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 Gastroesophageal reflux disease   . Acute gallstone pancreatitis 01/12/2018  . Pancreatitis 01/12/2018  . History of shoulder dystocia in prior pregnancy 12/02/2017  . History of postpartum hemorrhage 12/02/2017  . History of cholestasis during pregnancy 09/21/2017  . History of gestational diabetes 08/24/2017  . Chest pain due to GERD 08/05/2017  . Depression with anxiety and suicidal ideations 06/13/2017  . History of miscarriage 01/18/2017  . GBS bacteriuria 08/07/2016  . Conductive hearing loss, unilat, unrestrict hearing contralateral side 12/10/2015  . 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 Asthma, mild intermittent 02/18/2015  . 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 Perforation of left tympanic membrane 06/19/2014  . Cervicalgia 12/20/2013  . Abdominal pain, chronic, epigastric 08/08/2013  . ADHD (attention deficit hyperactivity disorder), combined type  08/01/2012  . ODD (oppositional defiant disorder) 08/01/2012  . Vasovagal syncope 02/28/2012  . Cholesteatoma of attic of right ear 02/15/2011   She desires Depo-Provera for contraception.  She plans to breastfeed  Prenatal labs and studies: ABO, Rh: A/Positive/-- (01/28 1504) Antibody: Negative (06/05 0934) Rubella: 1.04 (01/28 1504) RPR: Non Reactive (06/05 0934)  HBsAg: Negative (01/28 1504)  HIV: Non Reactive (06/05 0934)  GBS:    2hr Glucola: negative Genetic screening: normal Anatomy US: normal  Past Medical History:  Past Medical History:  Diagnosis Date  . Abdominal pain   . ADD (attention deficit disorder)   . Anemia   . Anginal pain ([place])   . Anxiety   . Arthritis    knees  . Asthma   . 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 Cervicalgia   . Cholestasis during pregnancy   . Chronic abdominal pain   . Complication of anesthesia    light headed  . Constipation   . Depression    not on meds  . Depression with anxiety 06/13/2017   06/13/17 rx zoloft 25mg  daily           06/06/18 w/ occ SI, rx Lexapro 10mg , [facility] for therapy  . Ear mass   . Gestational diabetes    pt states not been checking sugars regularly at home; nor has she been taking her Metformin  . Gestational diabetes   . Headache   . 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 HSV infection   . Low iron   . PONV (postoperative nausea and vomiting)   . Suicidal intent   . UTI (lower urinary tract infection)  05/2014  . Vomiting     Past Surgical History:  Past Surgical History:  Procedure Laterality Date  . CHOLECYSTECTOMY N/A 01/16/2018   Procedure: LAPAROSCOPIC CHOLECYSTECTOMY;  Surgeon: Aviva Signs, MD;  Location: [facility];  Service: General;  Laterality: N/A;  per Dr. Arnoldo Morale, pt will most likely go home and he will tell her to arrive at 10:45 - already has labs  . CHOLESTEATOMA EXCISION    . DILATION AND EVACUATION N/A 09/11/2016   Procedure:  DILATATION AND CURRATAGE  2ND TRIMESTER;  Surgeon: Jonnie Kind, MD;  Location: [facility];  Service: Gynecology;  Laterality: N/A;  . IMPLANTATION BONE ANCHORED HEARING AID Right 04/2013  . INNER EAR SURGERY Right 03/31/2018  . 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 TYMPANOPLASTY Left   . TYMPANOSTOMY      Obstetrical History:  OB History    Gravida  3   Para  1   Term  1   Preterm  0   AB  1   Living  1     SAB  1   TAB  0   Ectopic  0   Multiple  0   Live Births  1           Gynecological History:  OB History    Gravida  3   Para  1   Term  1   Preterm  0   AB  1   Living  1     SAB  1   TAB  0   Ectopic  0   Multiple  0   Live Births  1           Social History:  Social History   Socioeconomic History  . Marital status: Married    Spouse name: Franco Collet  . Number of children: 1  . Years of education: Not on file  . Highest education level: Not on file  Occupational History  . Not on file  Social Needs  . Financial resource strain: Not very hard  . Food insecurity    Worry: Often true    Inability: Often true  . Transportation needs    Medical: Yes    Non-medical: Yes  Tobacco Use  . Smoking status: Former Smoker    Packs/day: 0.50    Years: 1.00    Pack years: 0.50    Types: Cigarettes    Quit date: 03/11/2017    Years since quitting: 1.8  . Smokeless tobacco: Never Used  Substance and Sexual Activity  . Alcohol use: No    Alcohol/week: 0.0 standard drinks  . Drug use: No  . Sexual activity: Yes    Birth control/protection: None    Comment: last IC-this am  Lifestyle  . Physical activity    Days per week: 0 days    Minutes per session: 0 min  . Stress: Very much  Relationships  . Social connections    Talks on phone: More than three times a week    Gets together: More than three times a week    Attends religious service: 1 to 4 times per year    Active member of club or  organization: No    Attends meetings of clubs or organizations: Never    Relationship status: Married  Other Topics Concern  . Not on file  Social History Narrative   [employer]   Is smoker herself  Family History:  Family History  Problem Relation Age of Onset  . Cholelithiasis Mother   . Kidney disease Mother        stones  . Depression Mother   . Hypertension Maternal Grandmother   . Diabetes Maternal Grandmother   . Stroke Maternal Grandmother   . Seizures Maternal Grandmother   . Asthma Maternal Grandmother   . Hyperlipidemia Maternal Grandmother   . Thyroid disease Maternal Grandmother   . Asthma Brother   . Seizures Maternal Uncle   . Cancer Other        breast- great aunt  . Celiac disease Neg Hx     Medications:  Prenatal vitamins,  Current Facility-Administered Medications  Medication Dose Route Frequency Provider Last Rate Last Dose  . clindamycin (CLEOCIN) IVPB 900 mg  900 mg Intravenous 60 min Pre-Op [name], MD       And  . gentamicin (GARAMYCIN) 300 mg in dextrose 5 % 100 mL IVPB  300 mg Intravenous 60 min Pre-Op [name], MD      . tranexamic acid (CYKLOKAPRON) IVPB 1,000 mg  1,000 mg Intravenous To OR [name], MD        Allergies:  Allergies  Allergen Reactions  . Fentanyl Nausea And Vomiting  . Keflex [Cephalexin] Other (See Comments)    Reaction:  Elevated liver enzymes   . Other Other (See Comments)    Pt is allergic to surgical glue swelling;  Cats-congestion, sneezing, eyes watery, itching all over Reaction:  Infection   . Adhesive [Tape] Rash    Review of Systems: - negative  Physical Exam: Blood pressure (!) 141/61, pulse 84, temperature 97.6 F (36.4 C), temperature source Oral, resp. rate 19, last menstrual period 03/29/2018, not currently breastfeeding. GENERAL: Well-developed, well-nourished female in no acute distress.  LUNGS: Clear to auscultation bilaterally.  HEART: Regular rate  and rhythm. ABDOMEN: Soft, nontender, nondistended, gravid. EFW 8 lbs EXTREMITIES: Nontender, no edema, 2+ distal pulses. Cervical Exam: Dilatation 5cm     Presentation: cephalic FHT:  Baseline rate 146 bpm   Variability moderate  Accelerations present   Decelerations none Contractions: Every 2 mins   Pertinent Labs/Studies:   Lab Results  Component Value Date   WBC 10.4 12/24/2018   HGB 10.2 (L) 12/24/2018   HCT 32.8 (L) 12/24/2018   MCV 84.3 12/24/2018   PLT 151 12/24/2018    Assessment : Stacey Cook is a 23 y.o. G3P1011 at [redacted]w[redacted]d being admitted for cesarean section secondary to previous shoulder dystocia  Plan: The risks of cesarean section discussed with the patient included but were not limited to: bleeding which may require transfusion or reoperation; infection which may require antibiotics; injury to bowel, bladder, ureters or other surrounding organs; injury to the fetus; need for additional procedures including hysterectomy in the event of a life-threatening hemorrhage; placental abnormalities wth subsequent pregnancies, incisional problems, thromboembolic phenomenon and other postoperative/anesthesia complications. The patient concurred with the proposed plan, giving informed written consent for the procedure.   Patient has been NPO since 2am, and will remain NPO for procedure.  Preoperative prophylactic Clindamycin and Gentamycin ordered on call to the OR.    Truett Mainland, DO 01/03/2019, 4:26 AM

## 2019-01-03 NOTE — Op Note (Signed)
Stacey Cook PROCEDURE DATE: 01/03/2019  PREOPERATIVE DIAGNOSIS: Intrauterine pregnancy at  [redacted]w[redacted]d weeks gestation; prior shoulder dystocia  POSTOPERATIVE DIAGNOSIS: The same  PROCEDURE: Elective Primary Low Transverse Cesarean Section  SURGEON:  Dr. Loma Boston  ASSISTANT: Duanne Limerick, CST  INDICATIONS: Stacey Cook is a 23 y.o. G3P1011 at [redacted]w[redacted]d scheduled for cesarean section secondary to previous shoulder dystocia.  The risks of cesarean section discussed with the patient included but were not limited to: bleeding which may require transfusion or reoperation; infection which may require antibiotics; injury to bowel, bladder, ureters or other surrounding organs; injury to the fetus; need for additional procedures including hysterectomy in the event of a life-threatening hemorrhage; placental abnormalities wth subsequent pregnancies, incisional problems, thromboembolic phenomenon and other postoperative/anesthesia complications. The patient concurred with the proposed plan, giving informed written consent for the procedure.    FINDINGS:  Viable female infant in vertex presentation.  Apgars 9 and 9, weight pending.  Clear amniotic fluid.  Intact placenta, three vessel cord.  Normal uterus, fallopian tubes and ovaries bilaterally.  ANESTHESIA:    Spinal INTRAVENOUS FLUIDS:900 ml ESTIMATED BLOOD LOSS: 450 ml URINE OUTPUT:  200 ml SPECIMENS: Placenta sent to L&D COMPLICATIONS: None immediate  PROCEDURE IN DETAIL:  The patient received intravenous antibiotics and had sequential compression devices applied to her lower extremities while in the preoperative area.  She was then taken to the operating room where spinal anesthesia was administered and was found to be adequate. She was then placed in a dorsal supine position with a leftward tilt, and prepped and draped in a sterile manner.  A foley catheter was placed into her bladder and attached to constant gravity, which drained clear fluid  throughout.  The patient received preoperative antibiotics and prophylactic TXA due to her history of postpartum hemorrhage. Additionally, prior to surgery start, the patient was crossmatched for 2 units of PRBCs and had 2 large bore IVs. After an adequate timeout was performed, a Pfannenstiel skin incision was made with scalpel and carried through to the underlying layer of fascia. The fascia was incised in the midline and this incision was extended bilaterally using the Mayo scissors. Kocher clamps were applied to the superior aspect of the fascial incision and the underlying rectus muscles were dissected off bluntly. A similar process was carried out on the inferior aspect of the facial incision. The rectus muscles were separated in the midline bluntly and the peritoneum was entered bluntly. An Alexis retractor was placed to aid in visualization of the uterus.  Attention was turned to the lower uterine segment where a transverse hysterotomy was made with a scalpel and extended bilaterally bluntly. The infant was successfully delivered, and cord was clamped and cut and infant was handed over to awaiting neonatology team. Uterine massage was then administered and the placenta delivered intact with three-vessel cord. The uterus was then cleared of clot and debris.  The hysterotomy was closed with 0 Vicryl in a running locked fashion, and an imbricating layer was also placed with a 0 Vicryl. Overall, excellent hemostasis was noted. The abdomen and the pelvis were cleared of all clot and debris and the Stacey Cook was removed. Hemostasis was confirmed on all surfaces.  The peritoneum was reapproximated using 2-0 vicryl running stitches. The fascia was then closed using 0 Vicryl in a running fashion. The skin was closed with 4-0 vicryl. The patient tolerated the procedure well. Sponge, lap, instrument and needle counts were correct x 2. She was taken to the recovery room  in stable condition.    Truett Mainland,  DO 01/03/2019 7:20 AM

## 2019-01-04 ENCOUNTER — Other Ambulatory Visit (HOSPITAL_COMMUNITY): Payer: Medicaid Other

## 2019-01-04 LAB — CBC
HCT: 26.6 % — ABNORMAL LOW (ref 36.0–46.0)
Hemoglobin: 8.4 g/dL — ABNORMAL LOW (ref 12.0–15.0)
MCH: 26.7 pg (ref 26.0–34.0)
MCHC: 31.6 g/dL (ref 30.0–36.0)
MCV: 84.4 fL (ref 80.0–100.0)
Platelets: 152 10*3/uL (ref 150–400)
RBC: 3.15 MIL/uL — ABNORMAL LOW (ref 3.87–5.11)
RDW: 17.5 % — ABNORMAL HIGH (ref 11.5–15.5)
WBC: 11.4 10*3/uL — ABNORMAL HIGH (ref 4.0–10.5)
nRBC: 0 % (ref 0.0–0.2)

## 2019-01-04 LAB — BIRTH TISSUE RECOVERY COLLECTION (PLACENTA DONATION)

## 2019-01-04 NOTE — Progress Notes (Signed)
CSW received consult for history of SI, anxiety, depression and eating disorder.  CSW met with MOB to offer support and complete assessment.    MOB sitting up in bed breastfeeding infant, when CSW entered the room. CSW introduced self and explained reason for consult to which MOB was understanding. MOB pleasant and engaged throughout assessment and was appropriate and attentive to infant during visit. MOB visibly tired and informed CSW that she hadn't slept much and was starting to feel a little pain. MOB shared that her husband/FOB had to leave last night to go to work and infant has been crying a lot.  MOB noted that she was emotional the day before and that she had been crying but didn't know why. MOB attributed most of it to being on so many medications and just giving birth. CSW validated and normalized MOB's feelings and spoke with MOB regarding the baby blues period vs. perinatal mood disorders. MOB shared that she has been diagnosed with anxiety, depression, borderline personality disorder and an eating disorder all starting in the 9th grade. MOB stated she has felt an increase in depression and anxiety recently as she has had a lot going on. MOB shared she is currently taking Lexapro and was active with [facility] prior to turning 22 as they are no longer taking adults. MOB stated she is in the process of finding a new counselor. CSW gave resources for mental health follow up to which MOB was receptive. CSW recommended self-evaluation during the postpartum time period using the New Mom Checklist from Postpartum Progress and encouraged MOB to contact a medical professional if symptoms are noted at any time. MOB did not appear to be displaying any acute mental health symptoms and denied any current SI, HI or DV. MOB reported her primary support as her husband and her son. CSW inquired about MOB's interest in being referred to programs that could offer additional support but MOB reported she already intends  to follow up with the [facility] and is active with the [facility] with her son.   MOB confirmed having all essential items for infant once discharged and reported infant would be sleeping in a crib once home. MOB did explain they had limited clothes. CSW to provide MOB with Baby Bundle. CSW provided review of Sudden Infant Death Syndrome (SIDS) precautions and safe sleeping habits.    CSW identifies no further need for intervention and no barriers to discharge at this time.  Elijio Miles, [place]  [facility] [employer] [phone]

## 2019-01-04 NOTE — Plan of Care (Signed)
  Problem: Coping: Goal: Level of anxiety will decrease Outcome: Progressing   Problem: Elimination: Goal: Will not experience complications related to urinary retention Outcome: Progressing Note: Pt voids without difficulty.   Problem: Pain Managment: Goal: General experience of comfort will improve Outcome: Progressing Note: Pt has required scheduled meds & Percocet for pain management.   Problem: Activity: Goal: Ability to tolerate increased activity will improve Outcome: Progressing Note: Pt ambulates in room very little.  She is encouraged to ambulate in hallway.   Problem: Coping: Goal: Ability to identify and utilize available resources and services will improve Outcome: Progressing   Problem: Life Cycle: Goal: Chance of risk for complications during the postpartum period will decrease Outcome: Progressing Note: Fundas firm, bleeding WNL.   Problem: Role Relationship: Goal: Ability to demonstrate positive interaction with newborn will improve Outcome: Progressing   Problem: Skin Integrity: Goal: Demonstration of wound healing without infection will improve Outcome: Progressing

## 2019-01-04 NOTE — Lactation Note (Signed)
This note was copied from a baby's chart. Lactation Consultation Note  Patient Name: Stacey Cook [id number] Date: 01/04/2019 Reason for consult: Initial assessment;Early term 37-38.6wks  2109 - 2020 - I conducted an initial lactation consult with Stacey Cook. She indicated that she decided to formula feed her daughter Stacey Cook. She states that Stacey Cook is showing feeding cues and the desire to latch. However, mom is concerned about the safety of some of her medications, and she prefers to bottle feed.  I offered to look up any medications of concern. She verbalized understanding. I also suggested that she could provide baby some of her colostrum.  Mom asked which brand of formula was most like breast milk. I advised her to speak to her pediatrician for recommendations. She plans to use [employer] in [facility], per mom.  Mom does not have a breast pump at home. She does have [employer].  I recommended that she notify lactation via her RN if she desires assistance or decides she would like to pump. She states that she would prefer to call if she desires lactation follow up.  Maternal Data Formula Feeding for Exclusion: Yes  Feeding Feeding Type: Bottle Fed - Formula Nipple Type: Slow - flow   Interventions Interventions: Breast feeding basics reviewed  Lactation Tools Discussed/Used     Consult Status Consult Status: Complete    Lenore Manner 01/04/2019, 10:19 PM

## 2019-01-04 NOTE — Progress Notes (Addendum)
POSTPARTUM PROGRESS NOTE  POD #1  Subjective:  Stacey Cook is a 23 y.o. G3P1011 s/p LTCS at [redacted]w[redacted]d.  She reports she doing well. No acute events overnight. She reports she is doing well. She denies any problems with po intake.She has been able to ambulate but states her legs feel heavy and has been walking close to the wall in case she needs something to support her self. Her Foley catheter came out this AM. She tried to void this morning and states that she felt like she had to urinate but not much came out. Has had nausea for the past few days but denies changes to this and does not feel she needs medication for this at this time. Denies vomiting. Pain is well controlled, but she still has some sharp, stinging pain near her incision site when she sits up.  Lochia is appropriate. She feels a bit lightheaded but denies dizziness. She also reports itching since she woke up from her procedure last night - benadryl did not help but nalbuphine did.  Objective: Blood pressure 123/69, pulse 77, temperature 97.9 F (36.6 C), temperature source Oral, resp. rate 18, last menstrual period 03/29/2018, SpO2 100 %, not currently breastfeeding.  Physical Exam:  General: alert, cooperative and no distress Chest: no respiratory distress. CTAB Heart:regular rate, distal pulses intact Abdomen: soft, nontender,  Uterine Fundus: firm, appropriately tender DVT Evaluation: No calf swelling or tenderness Extremities: no edema Skin: warm, dry; incision clean/dry/intact w/ dressing in place  Recent Labs    01/03/19 0412 01/04/19 0523  HGB 10.3* 8.4*  HCT 33.5* 26.6*    Assessment/Plan: Stacey Cook is a 23 y.o. G3P1011 s/p LCTS at [redacted]w[redacted]d for history of previous shoulder dystocia.  POD#1 - Doing welll; pain well-controlled.  Routine postpartum care  OOB, ambulated  Lovenox for VTE prophylaxis Itching: Benadryl did not help - can consider trying nalbuphine again Anemia: Hgb 8.4, symptomatic with  lightheadedness         Continue po ferrous sulfate BID                    Consider fereheme                     Check CBC tomorrow Contraception: IUD vs. depo Feeding: breast  Dispo: Plan for discharge 8/28 or 8/29.   LOS: 1 day   [place] Student  01/04/2019, 7:56 AM

## 2019-01-05 ENCOUNTER — Encounter (HOSPITAL_COMMUNITY): Payer: Self-pay | Admitting: *Deleted

## 2019-01-05 LAB — CBC
HCT: 28.2 % — ABNORMAL LOW (ref 36.0–46.0)
Hemoglobin: 8.7 g/dL — ABNORMAL LOW (ref 12.0–15.0)
MCH: 26.5 pg (ref 26.0–34.0)
MCHC: 30.9 g/dL (ref 30.0–36.0)
MCV: 86 fL (ref 80.0–100.0)
Platelets: 163 10*3/uL (ref 150–400)
RBC: 3.28 MIL/uL — ABNORMAL LOW (ref 3.87–5.11)
RDW: 17.7 % — ABNORMAL HIGH (ref 11.5–15.5)
WBC: 8.9 10*3/uL (ref 4.0–10.5)
nRBC: 0 % (ref 0.0–0.2)

## 2019-01-05 MED ORDER — OXYCODONE-ACETAMINOPHEN 5-325 MG PO TABS: 1.0000 | ORAL_TABLET | ORAL | 0 refills | Status: DC | PRN

## 2019-01-05 MED ORDER — IBUPROFEN 800 MG PO TABS: 800.0000 mg | ORAL_TABLET | Freq: Four times a day (QID) | ORAL | 0 refills | Status: DC

## 2019-01-05 NOTE — Discharge Instructions (Signed)
Cesarean Delivery, Care After This sheet gives you information about how to care for yourself after your procedure. Your health care provider may also give you more specific instructions. If you have problems or questions, contact your health care provider. What can I expect after the procedure? After the procedure, it is common to have:  A small amount of blood or clear fluid coming from the incision.  Some redness, swelling, and pain in your incision area.  Some abdominal pain and soreness.  Vaginal bleeding (lochia). Even though you did not have a vaginal delivery, you will still have vaginal bleeding and discharge.  Pelvic cramps.  Fatigue. You may have pain, swelling, and discomfort in the tissue between your vagina and your anus (perineum) if:  Your C-section was unplanned, and you were allowed to labor and push.  An incision was made in the area (episiotomy) or the tissue tore during attempted vaginal delivery. Follow these instructions at home: Incision care   Follow instructions from your health care provider about how to take care of your incision. Make sure you: ? Wash your hands with soap and water before you change your bandage (dressing). If soap and water are not available, use hand sanitizer. ? If you have a dressing, change it or remove it as told by your health care provider. ? Leave stitches (sutures), skin staples, skin glue, or adhesive strips in place. These skin closures may need to stay in place for 2 weeks or longer. If adhesive strip edges start to loosen and curl up, you may trim the loose edges. Do not remove adhesive strips completely unless your health care provider tells you to do that.  Check your incision area every day for signs of infection. Check for: ? More redness, swelling, or pain. ? More fluid or blood. ? Warmth. ? Pus or a bad smell.  Do not take baths, swim, or use a hot tub until your health care provider says it's okay. Ask your health  care provider if you can take showers.  When you cough or sneeze, hug a pillow. This helps with pain and decreases the chance of your incision opening up (dehiscing). Do this until your incision heals. Medicines  Take over-the-counter and prescription medicines only as told by your health care provider.  If you were prescribed an antibiotic medicine, take it as told by your health care provider. Do not stop taking the antibiotic even if you start to feel better.  Do not drive or use heavy machinery while taking prescription pain medicine. Lifestyle  Do not drink alcohol. This is especially important if you are breastfeeding or taking pain medicine.  Do not use any products that contain nicotine or tobacco, such as cigarettes, e-cigarettes, and chewing tobacco. If you need help quitting, ask your health care provider. Eating and drinking  Drink at least 8 eight-ounce glasses of water every day unless told not to by your health care provider. If you breastfeed, you may need to drink even more water.  Eat high-fiber foods every day. These foods may help prevent or relieve constipation. High-fiber foods include: ? Whole grain cereals and breads. ? Brown rice. ? Beans. ? Fresh fruits and vegetables. Activity   If possible, have someone help you care for your baby and help with household activities for at least a few days after you leave the hospital.  Return to your normal activities as told by your health care provider. Ask your health care provider what activities are safe for  you. °· Rest as much as possible. Try to rest or take a nap while your baby is sleeping. °· Do not lift anything that is heavier than 10 lbs (4.5 kg), or the limit that you were told, until your health care provider says that it is safe. °· Talk with your health care provider about when you can engage in sexual activity. This may depend on your: °? Risk of infection. °? How fast you heal. °? Comfort and desire to  engage in sexual activity. °General instructions °· Do not use tampons or douches until your health care provider approves. °· Wear loose, comfortable clothing and a supportive and well-fitting bra. °· Keep your perineum clean and dry. Wipe from front to back when you use the toilet. °· If you pass a blood clot, save it and call your health care provider to discuss. Do not flush blood clots down the toilet before you get instructions from your health care provider. °· Keep all follow-up visits for you and your baby as told by your health care provider. This is important. °Contact a health care provider if: °· You have: °? A fever. °? Bad-smelling vaginal discharge. °? Pus or a bad smell coming from your incision. °? Difficulty or pain when urinating. °? A sudden increase or decrease in the frequency of your bowel movements. °? More redness, swelling, or pain around your incision. °? More fluid or blood coming from your incision. °? A rash. °? Nausea. °? Little or no interest in activities you used to enjoy. °? Questions about caring for yourself or your baby. °· Your incision feels warm to the touch. °· Your breasts turn red or become painful or hard. °· You feel unusually sad or worried. °· You vomit. °· You pass a blood clot from your vagina. °· You urinate more than usual. °· You are dizzy or light-headed. °Get help right away if: °· You have: °? Pain that does not go away or get better with medicine. °? Chest pain. °? Difficulty breathing. °? Blurred vision or spots in your vision. °? Thoughts about hurting yourself or your baby. °? New pain in your abdomen or in one of your legs. °? A severe headache. °· You faint. °· You bleed from your vagina so much that you fill more than one sanitary pad in one hour. Bleeding should not be heavier than your heaviest period. °Summary °· After the procedure, it is common to have pain at your incision site, abdominal cramping, and slight bleeding from your vagina. °· Check  your incision area every day for signs of infection. °· Tell your health care provider about any unusual symptoms. °· Keep all follow-up visits for you and your baby as told by your health care provider. °This information is not intended to replace advice given to you by your health care provider. Make sure you discuss any questions you have with your health care provider. °Document Released: 01/16/2002 Document Revised: 11/02/2017 Document Reviewed: 11/02/2017 °Elsevier Patient Education © 2020 Elsevier Inc. ° °

## 2019-01-06 ENCOUNTER — Inpatient Hospital Stay (HOSPITAL_COMMUNITY)
Admission: RE | Admit: 2019-01-06 | Discharge: 2019-01-06 | Disposition: A | Discharge status: Home / Self Care | Payer: Medicaid Other | Source: Ambulatory Visit

## 2019-01-06 HISTORY — DX: Unspecified hearing loss, right ear: H91.91

## 2019-01-07 LAB — TYPE AND SCREEN
ABO/RH(D): A POS
Antibody Screen: NEGATIVE
Unit division: 0
Unit division: 0

## 2019-01-07 LAB — BPAM RBC
Blood Product Expiration Date: 202009192359
Blood Product Expiration Date: 202009192359
Unit Type and Rh: 6200
Unit Type and Rh: 6200

## 2019-01-08 ENCOUNTER — Inpatient Hospital Stay (HOSPITAL_COMMUNITY)
Admission: RE | Admit: 2019-01-08 | Payer: Medicaid Other | Source: Home / Self Care | Admitting: Obstetrics & Gynecology

## 2019-01-12 ENCOUNTER — Encounter: Payer: Self-pay | Admitting: Obstetrics & Gynecology

## 2019-01-12 ENCOUNTER — Ambulatory Visit (INDEPENDENT_AMBULATORY_CARE_PROVIDER_SITE_OTHER): Payer: Medicaid Other | Admitting: Obstetrics & Gynecology

## 2019-01-12 ENCOUNTER — Other Ambulatory Visit: Payer: Self-pay

## 2019-01-12 VITALS — BP 118/77 | HR 93 | Ht 59.0 in | Wt 188.0 lb

## 2019-01-12 DIAGNOSIS — F53 Postpartum depression: Secondary | ICD-10-CM

## 2019-01-12 DIAGNOSIS — O99345 Other mental disorders complicating the puerperium: Secondary | ICD-10-CM

## 2019-01-12 DIAGNOSIS — Z9889 Other specified postprocedural states: Secondary | ICD-10-CM

## 2019-01-12 MED ORDER — VENLAFAXINE HCL ER 75 MG PO CP24: 75.0000 mg | ORAL_CAPSULE | Freq: Every day | ORAL | 3 refills | Status: DC

## 2019-01-12 NOTE — Progress Notes (Signed)
  HPI: Patient returns for routine postoperative follow-up having undergone primary C section on 01/03/2019.  The patient's immediate postoperative recovery has been unremarkable. Since hospital discharge the patient reports no problems.   Current Outpatient Medications: escitalopram (LEXAPRO) 20 MG tablet, Take 1 tablet (20 mg total) by mouth daily., Disp: 30 tablet, Rfl: 6 ibuprofen (ADVIL) 800 MG tablet, Take 1 tablet (800 mg total) by mouth every 6 (six) hours., Disp: 30 tablet, Rfl: 0 oxyCODONE-acetaminophen (PERCOCET/ROXICET) 5-325 MG tablet, Take 1-2 tablets by mouth every 4 (four) hours as needed for moderate pain., Disp: 25 tablet, Rfl: 0 prenatal vitamin w/FE, FA (PRENATAL 1 + 1) 27-1 MG TABS tablet, Take 1 tablet by mouth daily at 12 noon., Disp: 30 each, Rfl: 12 acetaminophen (TYLENOL) 500 MG tablet, Take 500 mg by mouth as needed., Disp: , Rfl:  Blood Pressure Monitoring (BLOOD PRESSURE MONITOR AUTOMAT) DEVI, Take BP at home per provider instructions (Patient not taking: Reported on 01/12/2019), Disp: 1 Device, Rfl: 0 cyclobenzaprine (FLEXERIL) 10 MG tablet, Take 1 tablet (10 mg total) by mouth 3 (three) times daily as needed for muscle spasms. May use 1/2 tablet for mild back pain. (Patient not taking: Reported on 01/02/2019), Disp: 30 tablet, Rfl: 0 esomeprazole (NEXIUM) 20 MG capsule, Take 1 capsule (20 mg total) by mouth daily at 12 noon. (Patient not taking: Reported on 01/02/2019), Disp: 30 capsule, Rfl: 6 ferrous sulfate 325 (65 FE) MG tablet, Take 1 tablet (325 mg total) by mouth 2 (two) times daily with a meal. (Patient not taking: Reported on 01/12/2019), Disp: 60 tablet, Rfl: 3 metroNIDAZOLE (FLAGYL) 500 MG tablet, Take 1 tablet (500 mg total) by mouth 2 (two) times daily. (Patient not taking: Reported on 12/25/2018), Disp: 14 tablet, Rfl: 0 terconazole (TERAZOL 7) 0.4 % vaginal cream, Place 1 applicator vaginally at bedtime. (Patient not taking: Reported on 12/25/2018), Disp: 45 g,  Rfl: 0 venlafaxine XR (EFFEXOR-XR) 75 MG 24 hr capsule, Take 1 capsule (75 mg total) by mouth daily., Disp: 30 capsule, Rfl: 3  No current facility-administered medications for this visit.     Blood pressure 118/77, pulse 93, height 4\' 11"  (1.499 m), weight 188 lb (85.3 kg), unknown if currently breastfeeding.  Physical Exam: Incision clean dry intact  Abdominal exam is benign  Diagnostic Tests:   Pathology: n/a  Impression: S/p primary C section for history of shoulder dystocia Postpartum exaserbation of underlying depression  Plan: Jeris Penta down lexapro and begin effexor 75 mg daily  Follow up: 1  months  [name], MD

## 2019-01-16 ENCOUNTER — Encounter: Payer: Medicaid Other | Admitting: Obstetrics & Gynecology

## 2019-02-09 ENCOUNTER — Encounter: Payer: Self-pay | Admitting: Women's Health

## 2019-02-09 ENCOUNTER — Other Ambulatory Visit: Payer: Self-pay

## 2019-02-09 ENCOUNTER — Ambulatory Visit (INDEPENDENT_AMBULATORY_CARE_PROVIDER_SITE_OTHER): Payer: Medicaid Other | Admitting: Adult Health

## 2019-02-09 DIAGNOSIS — Z3202 Encounter for pregnancy test, result negative: Secondary | ICD-10-CM

## 2019-02-09 DIAGNOSIS — F53 Postpartum depression: Secondary | ICD-10-CM | POA: Insufficient documentation

## 2019-02-09 DIAGNOSIS — Z30011 Encounter for initial prescription of contraceptive pills: Secondary | ICD-10-CM | POA: Insufficient documentation

## 2019-02-09 DIAGNOSIS — O99345 Other mental disorders complicating the puerperium: Secondary | ICD-10-CM | POA: Insufficient documentation

## 2019-02-09 DIAGNOSIS — L720 Epidermal cyst: Secondary | ICD-10-CM | POA: Insufficient documentation

## 2019-02-09 LAB — POCT URINE PREGNANCY: Preg Test, Ur: NEGATIVE

## 2019-02-09 MED ORDER — NORETHINDRONE 0.35 MG PO TABS: 1.0000 | ORAL_TABLET | Freq: Every day | ORAL | 11 refills | Status: DC

## 2019-02-09 MED ORDER — VENLAFAXINE HCL ER 150 MG PO CP24: 150.0000 mg | ORAL_CAPSULE | Freq: Every day | ORAL | 3 refills | Status: DC

## 2019-02-09 NOTE — Progress Notes (Signed)
Patient ID: Stacey Cook, female   DOB: 03/01/1996, 23 y.o.   MRN: [id number] Stacey Cook is a 23 year old white female, married, [id number], in for a postpartum visit.She had a primary C section at 38+5 weeks by Dr Nehemiah Settle.  Delivery Date: 01/03/19  Method of Delivery: Primary C section  Sexual Activity since delivery: Yes with condom, and husband felt ?pimple in vagina with is finger   Method of Feeding: Breast and bottle  Number of weeks bleeding post delivery: 3 weeks   Review of Systems: Patient denies any  hearing loss, fatigue, blurred vision, shortness of breath, chest pain, abdominal pain, problems with bowel movements, urination, or intercourse. No joint pain or mood swings. Has had some headaches and pain in back that radiates down leg esp when laying down(try ice and use pillow when holding baby) Has noticed odor on scar(bath with soap and water and pat dry) She has concerns over breast cancer as grandma and aunt had and 38 year old friend has, discussed mammogram at 62 unless mass felt.  Reviewed past medical,surgical, social and family history. Reviewed medications and allergies.   Depression Score: 22, which is rising, was 20 on 9/4 and Dr Elonda Husky placed her on Effexor   BP 105/70 (BP Location: Left Arm, Patient Position: Sitting, Cuff Size: Normal)   Pulse 91   Ht 4\' 11"  (1.499 m)   Wt 192 lb 8 oz (87.3 kg)   Breastfeeding Yes   BMI 38.88 kg/m   UPT is negative. Skin warm and dry. Neck: mid line trachea, normal thyroid, good ROM, no lymphadenopathy noted. Lungs: clear to ausculation bilaterally. Cardiovascular: regular rate and rhythm. Pelvic Exam:   External genitalia is normal in appearance, no lesions.  The vagina has good color, moisture and rugae, no lesions.Urethra has no masses or tenderness noted.Has 1 cm epidermal cyst, under urethra. The cervix is bulbous.  Uterus is felt to be normal size, shape, and contour, well involuted.  No adnexal masses or tenderness  noted.Bladder is non tender, no masses felt. Examination chaperoned by Diona Fanti CMA.   Impression and Plan: 1. Pregnancy examination or test, negative result   2. Postpartum care and examination -discussed may feel like pin stick along  C-section  At times -try ice on back  3. Depression, postpartum -will increase Effexor to 150 mg daily Meds ordered this encounter  Medications  . venlafaxine XR (EFFEXOR-XR) 150 MG 24 hr capsule    Sig: Take 1 capsule (150 mg total) by mouth daily with breakfast.    Dispense:  30 capsule    Refill:  3    Order Specific Question:   Supervising Provider    Answer:   Elonda Husky, LUTHER H [[id number]]  . norethindrone (MICRONOR) 0.35 MG tablet    Sig: Take 1 tablet (0.35 mg total) by mouth daily.    Dispense:  1 Package    Refill:  11    Order Specific Question:   Supervising Provider    Answer:   Tania Ade H [[id number]]  Follow up in 7 weeks or sooner if needed   4. Encounter for initial prescription of contraceptive pills -will rx Micronor to start Sunday and use condoms   5. Epidermal cyst -will  follow for now

## 2019-02-23 ENCOUNTER — Other Ambulatory Visit: Payer: Self-pay

## 2019-02-23 ENCOUNTER — Emergency Department (HOSPITAL_COMMUNITY)
Admission: EM | Admit: 2019-02-23 | Discharge: 2019-02-23 | Disposition: A | Discharge status: Home / Self Care | Payer: Medicaid Other | Attending: Emergency Medicine | Admitting: Emergency Medicine

## 2019-02-23 ENCOUNTER — Encounter (HOSPITAL_COMMUNITY): Payer: Self-pay

## 2019-02-23 ENCOUNTER — Emergency Department (HOSPITAL_COMMUNITY): Payer: Medicaid Other

## 2019-02-23 DIAGNOSIS — Y92019 Unspecified place in single-family (private) house as the place of occurrence of the external cause: Secondary | ICD-10-CM | POA: Insufficient documentation

## 2019-02-23 DIAGNOSIS — F17210 Nicotine dependence, cigarettes, uncomplicated: Secondary | ICD-10-CM | POA: Insufficient documentation

## 2019-02-23 DIAGNOSIS — Y939 Activity, unspecified: Secondary | ICD-10-CM | POA: Diagnosis not present

## 2019-02-23 DIAGNOSIS — J45909 Unspecified asthma, uncomplicated: Secondary | ICD-10-CM | POA: Insufficient documentation

## 2019-02-23 DIAGNOSIS — Z79899 Other long term (current) drug therapy: Secondary | ICD-10-CM | POA: Insufficient documentation

## 2019-02-23 DIAGNOSIS — W108XXA Fall (on) (from) other stairs and steps, initial encounter: Secondary | ICD-10-CM | POA: Insufficient documentation

## 2019-02-23 DIAGNOSIS — S99911A Unspecified injury of right ankle, initial encounter: Secondary | ICD-10-CM | POA: Diagnosis present

## 2019-02-23 DIAGNOSIS — S93401A Sprain of unspecified ligament of right ankle, initial encounter: Secondary | ICD-10-CM | POA: Insufficient documentation

## 2019-02-23 DIAGNOSIS — W19XXXA Unspecified fall, initial encounter: Secondary | ICD-10-CM

## 2019-02-23 DIAGNOSIS — Y999 Unspecified external cause status: Secondary | ICD-10-CM | POA: Insufficient documentation

## 2019-02-23 MED ORDER — IBUPROFEN 400 MG PO TABS
600.0000 mg | ORAL_TABLET | Freq: Once | ORAL | Status: AC
  Administered 2019-02-23: 600 mg via ORAL
  Filled 2019-02-23: qty 2

## 2019-02-23 MED ORDER — ACETAMINOPHEN 325 MG PO TABS
650.0000 mg | ORAL_TABLET | Freq: Once | ORAL | Status: AC
  Administered 2019-02-23: 650 mg via ORAL
  Filled 2019-02-23: qty 2

## 2019-02-23 NOTE — Discharge Instructions (Signed)
Elevate your foot, use ice packs over the swollen and painful areas.  You can use the Ace wrap but wear your cam walker until you can be rechecked by Dr. Aline Brochure in about a week or 10 days.  Use your crutches until you are able to walk in the cam walker.  You can take ibuprofen 600 mg plus acetaminophen 650 mg 4 times a day for pain.
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
## 2019-02-28 IMAGING — CT CT NECK W/ CM
4 of 5 series · 14 of 33 positions shown, 16 images · IV contrast (iopamidol)
Comparison: 01/09/2014

CLINICAL DATA: Left-sided neck pain and swelling. Sore throat over
the last week. Difficulty breathing and swallowing.

EXAM:
CT NECK WITH CONTRAST
TECHNIQUE: Multidetector CT imaging of the neck was performed using the
standard protocol following the bolus administration of intravenous
contrast.
CONTRAST:  75mL VZ715E-LCC IOPAMIDOL (VZ715E-LCC) INJECTION 61%

[Series 2: axial neck · axial · 0.56mm/px · z∈[+1410,+1548]mm · 4 of 115 slices shown, 5 images]
[im 23/115  soft-tissue]
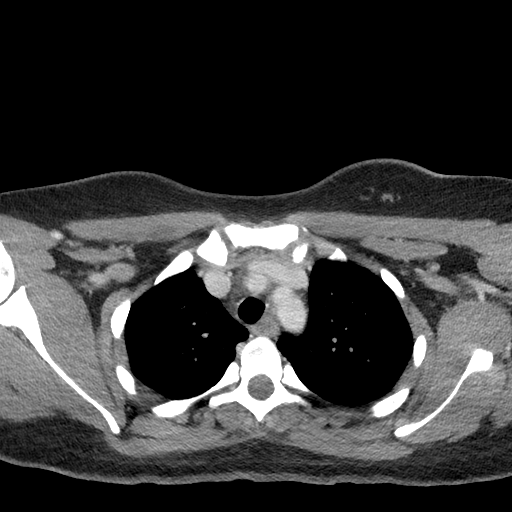
[im 23/115  bone]
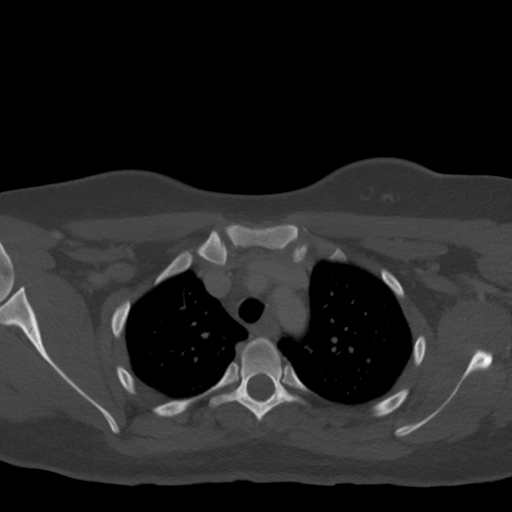
[im 46/115  bone]
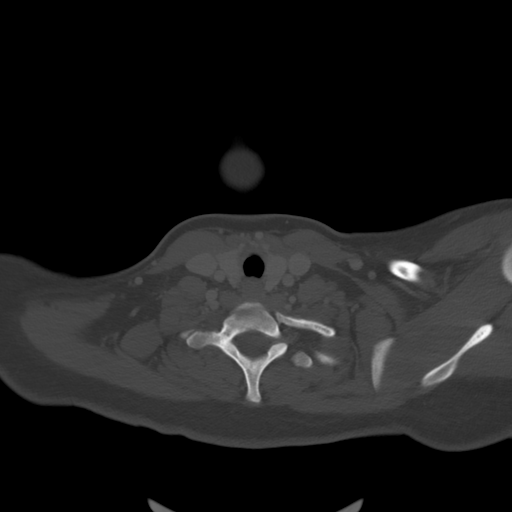
[im 69/115  bone]
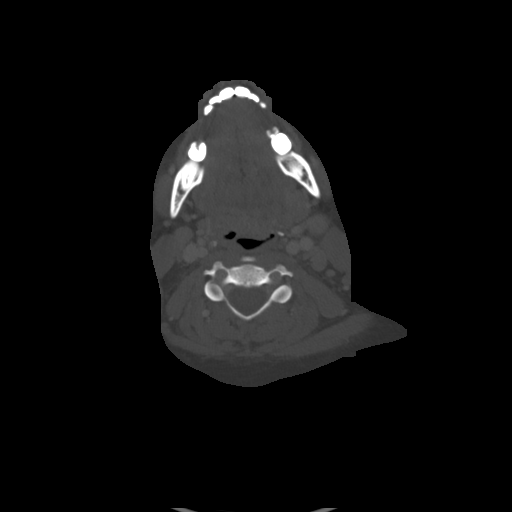
[im 92/115  bone]
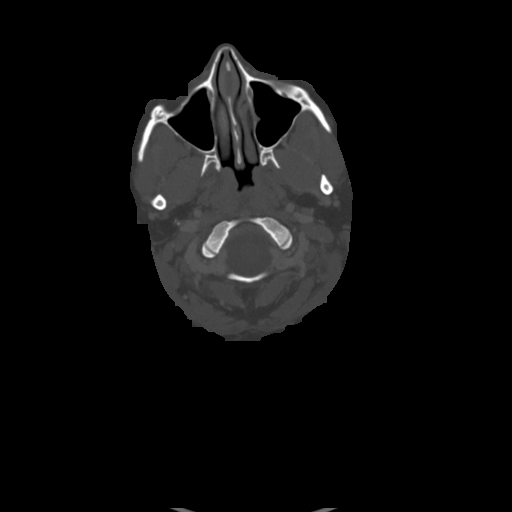

[Series 6: coronal neck · coronal · 0.48mm/px · 3 of 101 slices shown]
[im 21/101  bone]
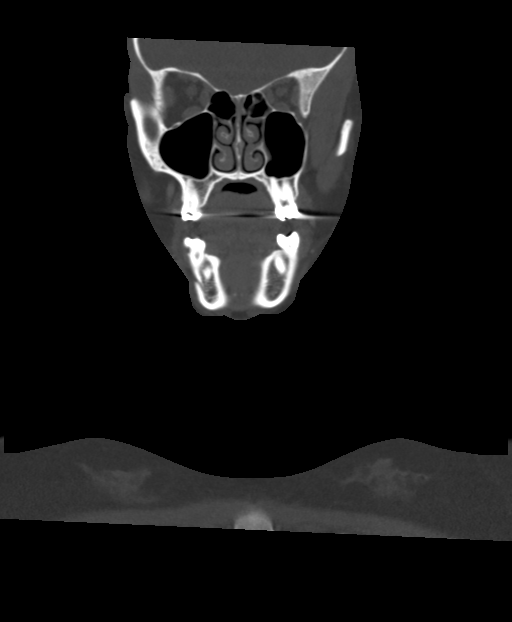
[im 41/101  bone]
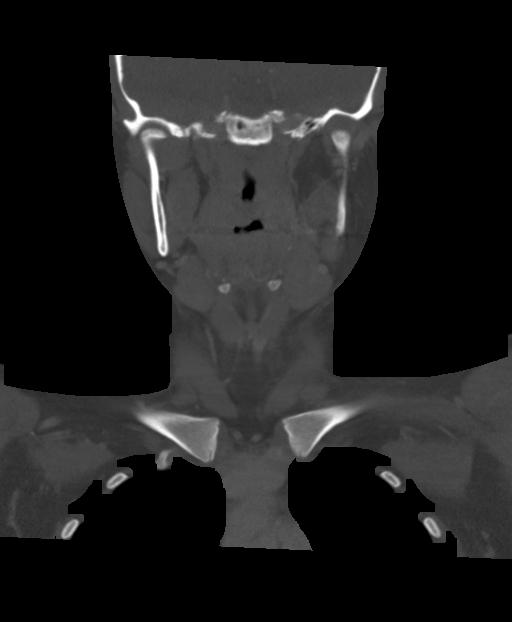
[im 61/101  bone]
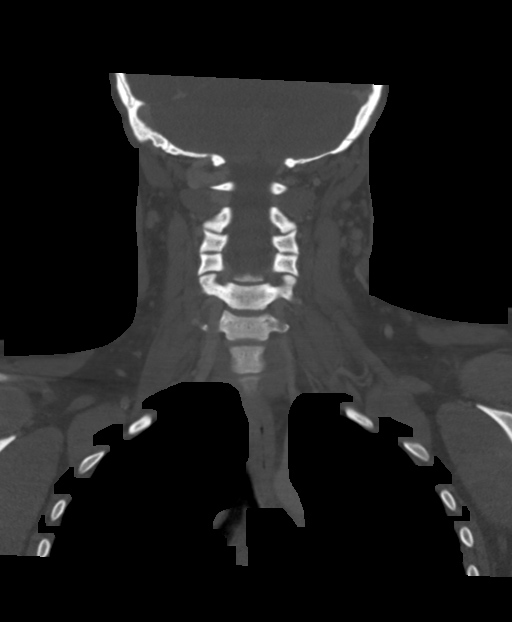

[Series 7: sagittal neck · sagittal · 0.39mm/px · 5 of 115 slices shown, 6 images]
[im 39/115  bone]
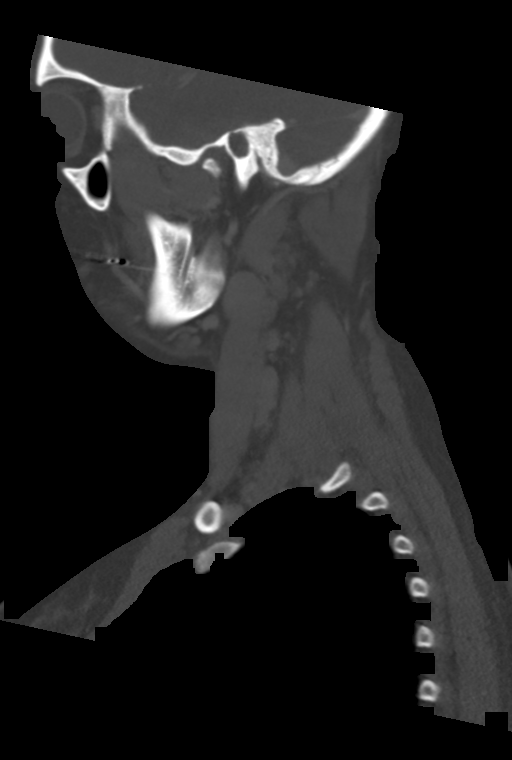
[im 48/115  bone]
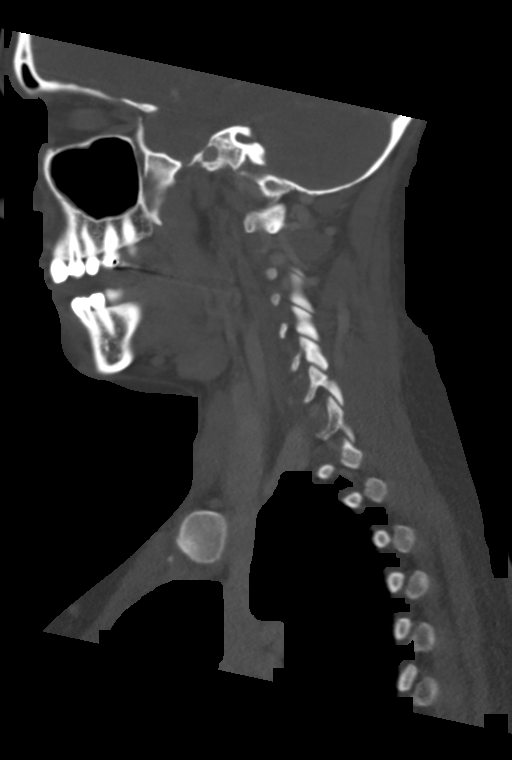
[im 58/115  soft-tissue]
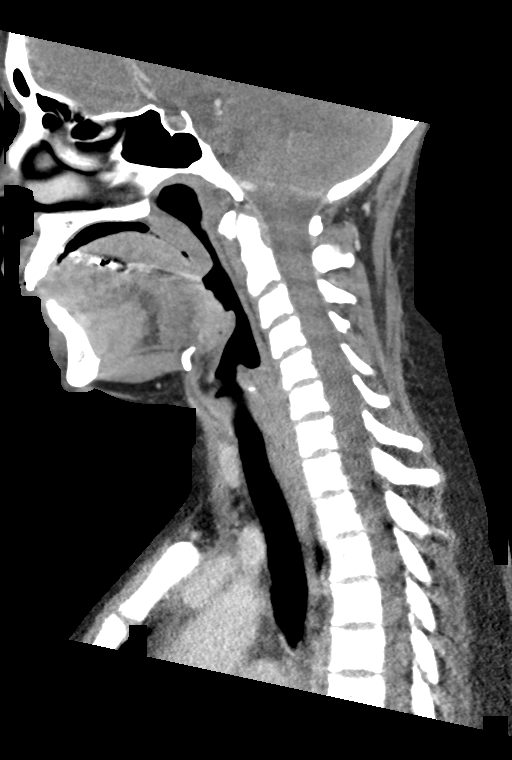
[im 58/115  bone]
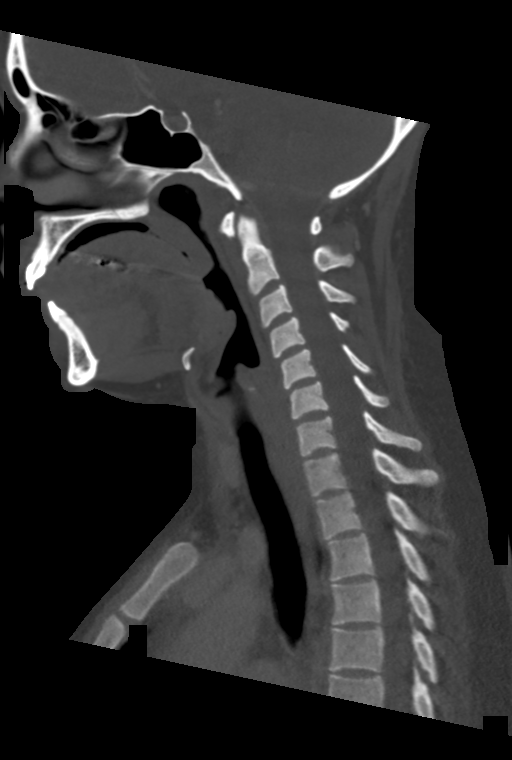
[im 67/115  bone]
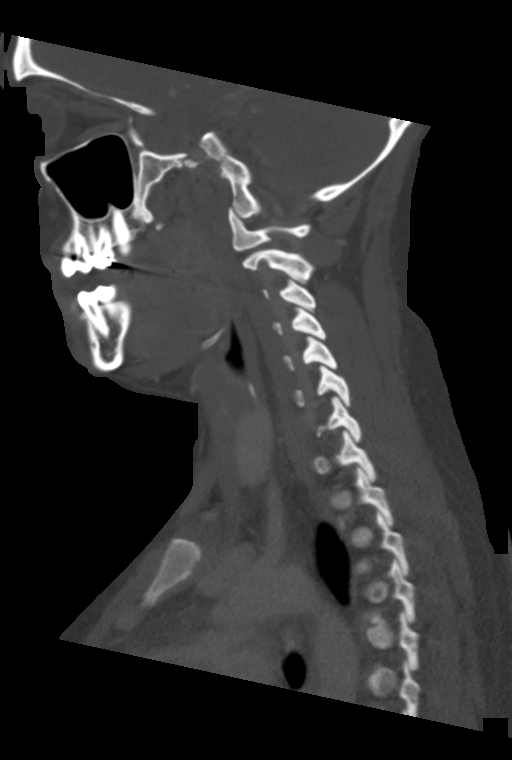
[im 77/115  bone]
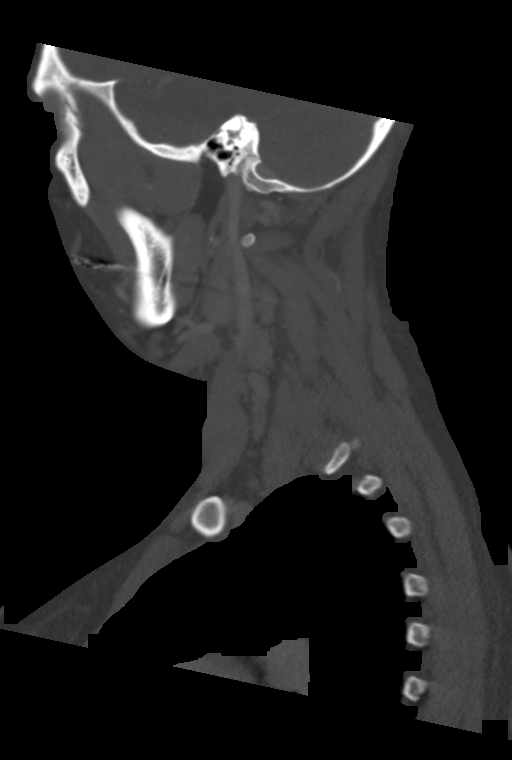

[Series 8: orthogonal ax · axial · 0.56mm/px · z∈[+1368,+1420]mm · 2 of 134 slices shown]
[im 27/134  bone]
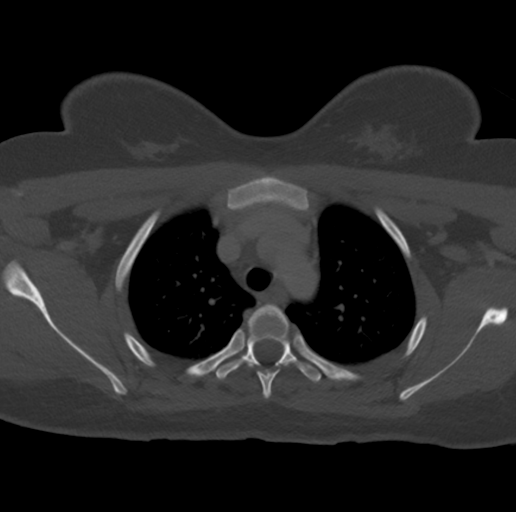
[im 54/134  bone]
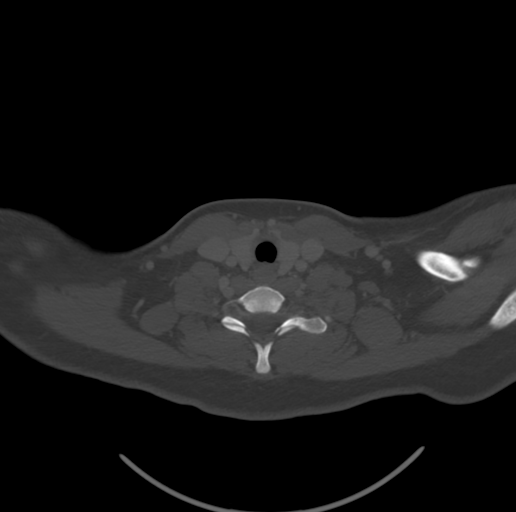

[14 of 33 positions shown; findings below may reference images not displayed]

FINDINGS: Pharynx and larynx: The patient does have prominence of the mucosal
tissues in the posterior pharynx, base of the tongue and tonsillar
regions consistent with pharyngitis/ tonsillitis. No evidence of
tonsillar or peritonsillar abscess.

Salivary glands: Normal

Thyroid: Normal

Lymph nodes: Reactive nodal enlargement, particularly level 2 nodes,
left worse than right. No suppuration.

Vascular: Normal

Limited intracranial: Normal

Visualized orbits: Normal

Mastoids and visualized paranasal sinuses: Clear

Skeleton: Normal

Upper chest: Normal

Other: None
IMPRESSION: Pharyngitis and tonsillitis left worse than right. Reactive lymph
adenitis. No evidence of frank abscess or suppurative nodes.

## 2019-03-05 ENCOUNTER — Telehealth: Payer: Self-pay | Admitting: *Deleted

## 2019-03-05 NOTE — Telephone Encounter (Signed)
Patient states she has had 2 periods this month with being on her current birth control pills.  She is no longer breastfeeding and wants to switch.

## 2019-03-06 NOTE — Telephone Encounter (Signed)
Pt on micronor and no longer breast feeding would like to get a different pill sent in.

## 2019-03-07 ENCOUNTER — Encounter: Payer: Self-pay | Admitting: *Deleted

## 2019-03-07 MED ORDER — LO LOESTRIN FE 1 MG-10 MCG / 10 MCG PO TABS: 1.0000 | ORAL_TABLET | Freq: Every day | ORAL | 3 refills | Status: DC

## 2019-03-07 NOTE — Telephone Encounter (Signed)
Patient is a smoker, and has migraines but no aura. No hx of htn, blood clots or stroke.

## 2019-03-07 NOTE — Addendum Note (Signed)
Addended by: Roma Schanz on: 03/07/2019 09:02 AM   Modules accepted: Orders

## 2019-03-30 ENCOUNTER — Ambulatory Visit: Payer: Medicaid Other | Admitting: Adult Health

## 2019-04-02 DIAGNOSIS — L03811 Cellulitis of head [any part, except face]: Secondary | ICD-10-CM | POA: Insufficient documentation

## 2019-04-09 ENCOUNTER — Emergency Department (HOSPITAL_COMMUNITY): Payer: Medicaid Other

## 2019-04-09 ENCOUNTER — Encounter (HOSPITAL_COMMUNITY): Payer: Self-pay | Admitting: Emergency Medicine

## 2019-04-09 ENCOUNTER — Emergency Department (HOSPITAL_COMMUNITY)
Admission: EM | Admit: 2019-04-09 | Discharge: 2019-04-10 | Disposition: A | Discharge status: Home / Self Care | Payer: Medicaid Other | Source: Home / Self Care | Attending: Emergency Medicine | Admitting: Emergency Medicine

## 2019-04-09 ENCOUNTER — Emergency Department (HOSPITAL_COMMUNITY)
Admission: EM | Admit: 2019-04-09 | Discharge: 2019-04-09 | Disposition: A | Discharge status: Home / Self Care | Payer: Medicaid Other | Attending: Emergency Medicine | Admitting: Emergency Medicine

## 2019-04-09 ENCOUNTER — Other Ambulatory Visit: Payer: Self-pay

## 2019-04-09 DIAGNOSIS — R1013 Epigastric pain: Secondary | ICD-10-CM

## 2019-04-09 DIAGNOSIS — J45909 Unspecified asthma, uncomplicated: Secondary | ICD-10-CM | POA: Insufficient documentation

## 2019-04-09 DIAGNOSIS — Z79899 Other long term (current) drug therapy: Secondary | ICD-10-CM | POA: Insufficient documentation

## 2019-04-09 DIAGNOSIS — F17210 Nicotine dependence, cigarettes, uncomplicated: Secondary | ICD-10-CM | POA: Insufficient documentation

## 2019-04-09 DIAGNOSIS — R112 Nausea with vomiting, unspecified: Secondary | ICD-10-CM | POA: Insufficient documentation

## 2019-04-09 DIAGNOSIS — Z5321 Procedure and treatment not carried out due to patient leaving prior to being seen by health care provider: Secondary | ICD-10-CM | POA: Insufficient documentation

## 2019-04-09 DIAGNOSIS — R110 Nausea: Secondary | ICD-10-CM | POA: Diagnosis not present

## 2019-04-09 LAB — CBC
HCT: 39.2 % (ref 36.0–46.0)
Hemoglobin: 12.2 g/dL (ref 12.0–15.0)
MCH: 25.6 pg — ABNORMAL LOW (ref 26.0–34.0)
MCHC: 31.1 g/dL (ref 30.0–36.0)
MCV: 82.4 fL (ref 80.0–100.0)
Platelets: 243 10*3/uL (ref 150–400)
RBC: 4.76 MIL/uL (ref 3.87–5.11)
RDW: 15.3 % (ref 11.5–15.5)
WBC: 9.1 10*3/uL (ref 4.0–10.5)
nRBC: 0 % (ref 0.0–0.2)

## 2019-04-09 LAB — COMPREHENSIVE METABOLIC PANEL
ALT: 30 U/L (ref 0–44)
AST: 25 U/L (ref 15–41)
Albumin: 4.2 g/dL (ref 3.5–5.0)
Alkaline Phosphatase: 107 U/L (ref 38–126)
Anion gap: 7 (ref 5–15)
BUN: 8 mg/dL (ref 6–20)
CO2: 22 mmol/L (ref 22–32)
Calcium: 9.3 mg/dL (ref 8.9–10.3)
Chloride: 106 mmol/L (ref 98–111)
Creatinine, Ser: 0.56 mg/dL (ref 0.44–1.00)
GFR calc Af Amer: >60 mL/min (ref >60)
GFR calc non Af Amer: >60 mL/min (ref >60)
Glucose, Bld: 94 mg/dL (ref 70–99)
Potassium: 4 mmol/L (ref 3.5–5.1)
Sodium: 135 mmol/L (ref 135–145)
Total Bilirubin: 0.3 mg/dL (ref 0.3–1.2)
Total Protein: 7.7 g/dL (ref 6.5–8.1)

## 2019-04-09 LAB — LIPASE, BLOOD: Lipase: 28 U/L (ref 11–51)

## 2019-04-09 MED ORDER — MORPHINE SULFATE (PF) 4 MG/ML IV SOLN
4.0000 mg | Freq: Once | INTRAVENOUS | Status: AC
  Administered 2019-04-09: 4 mg via INTRAVENOUS
  Filled 2019-04-09: qty 1

## 2019-04-09 MED ORDER — SODIUM CHLORIDE 0.9 % IV BOLUS
1000.0000 mL | Freq: Once | INTRAVENOUS | Status: AC
  Administered 2019-04-09: 1000 mL via INTRAVENOUS

## 2019-04-09 MED ORDER — ONDANSETRON HCL 4 MG/2ML IJ SOLN
4.0000 mg | Freq: Once | INTRAMUSCULAR | Status: AC
  Administered 2019-04-09: 4 mg via INTRAVENOUS
  Filled 2019-04-09: qty 2

## 2019-04-09 NOTE — ED Provider Notes (Signed)
[facility] EMERGENCY DEPARTMENT Provider Note   CSN: [id number] Arrival date & time: 04/09/19  2120     History   Chief Complaint Chief Complaint  Patient presents with  . Abdominal Pain    HPI Stacey Cook is a 23 y.o. female with a past medical history significant for anxiety, asthma, binge-eating disorder, and ADHD who presents to the ED due to sudden onset of epigastric pain that radiates to back that started yesterday. Epigastric pain is sharp and squeezing in character and associated with nausea and vomiting. Patient admits to 4 episodes of non-bloody, non-bilious emesis this morning. She notes she has a history of pancreatitis due to gallstones which she says feels similar to this episode, but patient had her gallbladder removed. Patient notes that the pain is so severe that it causes shortness of breath. She drinks alcohol occasionally. Denies drug and tobacco use. Patient denies urinary and vaginal symptoms. Patient has also had a cesarean section.   Past Medical History:  Diagnosis Date  . Abdominal pain   . ADD (attention deficit disorder)   . Anemia   . Anginal pain ([place])   . Anxiety   . Arthritis    knees  . Asthma   . 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 Cervicalgia   . Cholestasis during pregnancy   . Chronic abdominal pain   . Complication of anesthesia    light headed  . Constipation   . Depression    not on meds  . Depression with anxiety 06/13/2017   06/13/17 rx zoloft 25mg  daily           06/06/18 w/ occ SI, rx Lexapro 10mg , [facility] for therapy  . Ear mass   . Gestational diabetes    pt states not been checking sugars regularly at home; nor has she been taking her Metformin  . Gestational diabetes   . Headache   . 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 HSV infection   . Low iron   . PONV (postoperative nausea and vomiting)   . Suicidal intent   . UTI (lower urinary tract infection) 05/2014  .  Vomiting     Patient Active Problem List   Diagnosis Date Noted  . Depression, postpartum 02/09/2019  . Epidermal cyst 02/09/2019  . Status post cesarean section 01/03/2019  . Ovarian cyst 07/04/2018  . 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 Gastroesophageal reflux disease   . Acute gallstone pancreatitis 01/12/2018  . Pancreatitis 01/12/2018  . History of shoulder dystocia in prior pregnancy 12/02/2017  . History of postpartum hemorrhage 12/02/2017  . History of cholestasis during pregnancy 09/21/2017  . History of gestational diabetes 08/24/2017  . Chest pain due to GERD 08/05/2017  . Depression with anxiety and suicidal ideations 06/13/2017  . History of miscarriage 01/18/2017  . Conductive hearing loss, unilat, unrestrict hearing contralateral side 12/10/2015  . 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 Asthma, mild intermittent 02/18/2015  . 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 Perforation of left tympanic membrane 06/19/2014  . Cervicalgia 12/20/2013  . Abdominal pain, chronic, epigastric 08/08/2013  . ADHD (attention deficit hyperactivity disorder), combined type 08/01/2012  . ODD (oppositional defiant disorder) 08/01/2012  . Vasovagal syncope 02/28/2012  . Cholesteatoma of  attic of right ear 02/15/2011    Past Surgical History:  Procedure Laterality Date  . CESAREAN SECTION N/A 01/03/2019   Procedure: CESAREAN SECTION;  Surgeon: [name], MD;  Location: [facility] ORS;  Service: Obstetrics;  Laterality: N/A;  . CHOLECYSTECTOMY N/A 01/16/2018   Procedure: LAPAROSCOPIC CHOLECYSTECTOMY;  Surgeon: Aviva Signs, MD;  Location: [facility] ORS;  Service: General;  Laterality: N/A;  per Dr. Arnoldo Morale, pt will most likely go home and he will tell her to arrive at 10:45 - already has labs  .  CHOLESTEATOMA EXCISION    . DILATION AND EVACUATION N/A 09/11/2016   Procedure: DILATATION AND CURRATAGE  2ND TRIMESTER;  Surgeon: Jonnie Kind, MD;  Location: [facility]S;  Service: Gynecology;  Laterality: N/A;  . IMPLANTATION BONE ANCHORED HEARING AID Right 04/2013  . INNER EAR SURGERY Right 03/31/2018  . 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 TYMPANOPLASTY Left   . TYMPANOSTOMY       OB History    Gravida  3   Para  2   Term  2   Preterm  0   AB  1   Living  2     SAB  1   TAB  0   Ectopic  0   Multiple  0   Live Births  2            Home Medications    Prior to Admission medications   Medication Sig Start Date End Date Taking? Authorizing Provider  acetaminophen (TYLENOL) 500 MG tablet Take 500 mg by mouth as needed.    [provider]  ciprofloxacin (CIPRO) 500 MG tablet Take 1 tablet (500 mg total) by mouth 2 (two) times daily. 04/10/19   Lajean Saver, MD  cyclobenzaprine (FLEXERIL) 10 MG tablet Take 1 tablet (10 mg total) by mouth 3 (three) times daily as needed for muscle spasms. May use 1/2 tablet for mild back pain. 10/29/18   Burleson, Rona Ravens, NP  ferrous sulfate 325 (65 FE) MG tablet Take 1 tablet (325 mg total) by mouth 2 (two) times daily with a meal. Patient taking differently: Take 325 mg by mouth daily.  12/11/18   Roma Schanz, CNM  ibuprofen (ADVIL) 800 MG tablet Take 1 tablet (800 mg total) by mouth every 6 (six) hours. Patient taking differently: Take 800 mg by mouth as needed.  01/05/19   Seabron Spates, CNM  LO LOESTRIN FE 1 MG-10 MCG / 10 MCG tablet Take 1 tablet by mouth daily. 03/07/19   Roma Schanz, CNM  norethindrone (MICRONOR) 0.35 MG tablet Take 1 tablet (0.35 mg total) by mouth daily. 02/09/19   Estill Dooms, NP  pantoprazole (PROTONIX) 40 MG tablet Take 1 tablet (40 mg total) by mouth daily. 04/10/19   Lajean Saver, MD  venlafaxine XR (EFFEXOR-XR) 150 MG 24 hr capsule Take 1 capsule (150  mg total) by mouth daily with breakfast. 02/09/19   Estill Dooms, NP    Family History Family History  Problem Relation Age of Onset  . Cholelithiasis Mother   . Kidney disease Mother        stones  . Depression Mother   . Hypertension Maternal Grandmother   . Diabetes Maternal Grandmother   . Stroke Maternal Grandmother   . Seizures Maternal Grandmother   . Asthma Maternal Grandmother   . Hyperlipidemia Maternal Grandmother   . Thyroid disease Maternal Grandmother   . Asthma Brother   .  Seizures Maternal Uncle   . Cancer Other        breast- great aunt  . Celiac disease Neg Hx     Social History Social History   Tobacco Use  . Smoking status: Current Some Day Smoker    Packs/day: 0.50    Years: 1.00    Pack years: 0.50    Types: Cigarettes    Last attempt to quit: 03/11/2017    Years since quitting: 2.0  . Smokeless tobacco: Never Used  Substance Use Topics  . Alcohol use: No    Alcohol/week: 0.0 standard drinks  . Drug use: No     Allergies   Keflex [cephalexin], Other, and Adhesive [tape]   Review of Systems Review of Systems  Constitutional: Negative for chills and fever.  Respiratory: Positive for shortness of breath.   Cardiovascular: Negative for leg swelling.  Gastrointestinal: Positive for abdominal pain, diarrhea, nausea and vomiting.  Genitourinary: Negative for dysuria and vaginal discharge.  Musculoskeletal: Positive for back pain.  Neurological: Negative for weakness, numbness and headaches.  All other systems reviewed and are negative.    Physical Exam Updated Vital Signs BP 109/60 (BP Location: Right Arm)   Pulse 88   Temp (!) 97.2 F (36.2 C) (Temporal)   Resp 16   Ht 4\' 11"  (1.499 m)   Wt 86.6 kg   LMP 04/04/2019   SpO2 99%   BMI 38.58 kg/m   Physical Exam Vitals signs and nursing note reviewed.  Constitutional:      General: She is not in acute distress.    Appearance: She is not toxic-appearing.     Comments: Dry  heaving into emesis bag  HENT:     Head: Normocephalic.  Eyes:     Pupils: Pupils are equal, round, and reactive to light.  Neck:     Musculoskeletal: Neck supple.  Cardiovascular:     Rate and Rhythm: Normal rate and regular rhythm.     Pulses: Normal pulses.     Heart sounds: Normal heart sounds. No murmur. No friction rub. No gallop.   Pulmonary:     Effort: Pulmonary effort is normal.     Breath sounds: Normal breath sounds.  Abdominal:     General: Abdomen is flat. Bowel sounds are normal. There is no distension.     Palpations: Abdomen is soft.     Tenderness: There is abdominal tenderness. There is guarding. There is no right CVA tenderness or left CVA tenderness.     Comments: Tenderness to palpation throughout, but most significant in epigastric region and LLQ with voluntary guarding.   Musculoskeletal:     Right lower leg: No edema.     Left lower leg: No edema.  Skin:    General: Skin is warm and dry.  Neurological:     General: No focal deficit present.     Mental Status: She is alert.      ED Treatments / Results  Labs (all labs ordered are listed, but only abnormal results are displayed) Labs Reviewed  URINALYSIS, ROUTINE W REFLEX MICROSCOPIC - Abnormal; Notable for the following components:      Result Value   APPearance HAZY (*)    Protein, ur 30 (*)    Leukocytes,Ua LARGE (*)    All other components within normal limits  URINE CULTURE  PREGNANCY, URINE    EKG None  Radiology Ct Abdomen Pelvis W Contrast  Result Date: 04/10/2019 CLINICAL DATA:  Epigastric pain EXAM: CT ABDOMEN AND  PELVIS WITH CONTRAST TECHNIQUE: Multidetector CT imaging of the abdomen and pelvis was performed using the standard protocol following bolus administration of intravenous contrast. CONTRAST:  15mL OMNIPAQUE IOHEXOL 300 MG/ML  SOLN COMPARISON:  12/24/2017 FINDINGS: Lower chest: Lung bases are clear. No effusions. Heart is normal size. Hepatobiliary: No focal liver  abnormality is seen. Status post cholecystectomy. No biliary dilatation. Pancreas: No focal abnormality or ductal dilatation. Spleen: Mild splenomegaly with the craniocaudal length of the spleen 13.4 cm. No focal abnormality. Adrenals/Urinary Tract: No adrenal abnormality. No focal renal abnormality. No stones or hydronephrosis. Urinary bladder is unremarkable. Stomach/Bowel: Stomach, large and small bowel grossly unremarkable. Vascular/Lymphatic: No evidence of aneurysm or adenopathy. Reproductive: Uterus and adnexa unremarkable.  No mass. Other: No free fluid or free air. Musculoskeletal: No acute bony abnormality. IMPRESSION: Mild splenomegaly. Prior cholecystectomy. Electronically Signed   By: Rolm Baptise M.D.   On: 04/10/2019 01:01    Procedures Procedures (including critical care time)  Medications Ordered in ED Medications  sodium chloride 0.9 % bolus 1,000 mL (0 mLs Intravenous Stopped 04/10/19 0123)  ondansetron (ZOFRAN) injection 4 mg (4 mg Intravenous Given 04/09/19 2339)  morphine 4 MG/ML injection 4 mg (4 mg Intravenous Given 04/09/19 2339)  HYDROmorphone (DILAUDID) injection 1 mg (1 mg Intravenous Given 04/10/19 0033)  iohexol (OMNIPAQUE) 300 MG/ML solution 100 mL (100 mLs Intravenous Contrast Given 04/10/19 0041)  ciprofloxacin (CIPRO) tablet 500 mg (500 mg Oral Given 04/10/19 0122)  acetaminophen (TYLENOL) tablet 1,000 mg (1,000 mg Oral Given 04/10/19 0123)  famotidine (PEPCID) tablet 20 mg (20 mg Oral Given 04/10/19 0203)  alum & mag hydroxide-simeth (MAALOX/MYLANTA) 200-200-20 MG/5ML suspension 15 mL (15 mLs Oral Given 04/10/19 0202)     Initial Impression / Assessment and Plan / ED Course  I have reviewed the triage vital signs and the nursing notes.  Pertinent labs & imaging results that were available during my care of the patient were reviewed by me and considered in my medical decision making (see chart for details).        23 year old female presents to ED due to  epigastric and LLQ pain that radiates to back. Patient is afebrile, not tachycardic or hypoxic. Patient in no acute distress and non-toxic appearing. Patient is dry heaving into emesis bag during initial exam. Abdomen soft, non-distended with diffuse tenderness, most significantly in epigastric and LLQ regions with voluntary guarding. Labs were drawn earlier in the day when patient presented to ED then left and returned. Will add UA and pregnancy test. Will order CT scan to rule out pancreatitis and other intraabdominal ethologies. Will give IVFs.  Lipase normal. CMP unremarkable. CBC unremarkable. Patient handed off to Dr. Ashok Cordia at shift change who will follow-up on CT and UA results, reassess, and determine disposition.  Final Clinical Impressions(s) / ED Diagnoses   Final diagnoses:  Epigastric pain    ED Discharge Orders         Ordered    pantoprazole (PROTONIX) 40 MG tablet  Daily     04/10/19 0207    ciprofloxacin (CIPRO) 500 MG tablet  2 times daily     04/10/19 0207           Romie Levee 04/10/19 0255    Lajean Saver, MD 04/10/19 [phone]

## 2019-04-09 NOTE — ED Notes (Signed)
Not in [place] when called   Others in [place] report   "she left"

## 2019-04-09 NOTE — ED Triage Notes (Signed)
Pt states she has been having epigastric pain going through to back with nausea since yesterday.

## 2019-04-10 LAB — URINALYSIS, ROUTINE W REFLEX MICROSCOPIC
Bacteria, UA: NONE SEEN
Bilirubin Urine: NEGATIVE
Glucose, UA: NEGATIVE mg/dL
Hgb urine dipstick: NEGATIVE
Ketones, ur: NEGATIVE mg/dL
Nitrite: NEGATIVE
Protein, ur: 30 mg/dL — AB
Specific Gravity, Urine: 1.02 (ref 1.005–1.030)
pH: 8 (ref 5.0–8.0)

## 2019-04-10 LAB — PREGNANCY, URINE: Preg Test, Ur: NEGATIVE

## 2019-04-10 MED ORDER — CIPROFLOXACIN HCL 250 MG PO TABS
500.0000 mg | ORAL_TABLET | Freq: Once | ORAL | Status: AC
  Administered 2019-04-10: 500 mg via ORAL
  Filled 2019-04-10: qty 2

## 2019-04-10 MED ORDER — IOHEXOL 300 MG/ML  SOLN
100.0000 mL | Freq: Once | INTRAMUSCULAR | Status: AC | PRN
  Administered 2019-04-10: 01:00:00 100 mL via INTRAVENOUS

## 2019-04-10 MED ORDER — HYDROMORPHONE HCL 1 MG/ML IJ SOLN
1.0000 mg | Freq: Once | INTRAMUSCULAR | Status: AC
  Administered 2019-04-10: 1 mg via INTRAVENOUS
  Filled 2019-04-10: qty 1

## 2019-04-10 MED ORDER — ALUM & MAG HYDROXIDE-SIMETH 200-200-20 MG/5ML PO SUSP
15.0000 mL | Freq: Once | ORAL | Status: AC
  Administered 2019-04-10: 15 mL via ORAL
  Filled 2019-04-10: qty 30

## 2019-04-10 MED ORDER — CIPROFLOXACIN HCL 500 MG PO TABS: 500.0000 mg | ORAL_TABLET | Freq: Two times a day (BID) | ORAL | 0 refills | Status: DC

## 2019-04-10 MED ORDER — PANTOPRAZOLE SODIUM 40 MG PO TBEC: 40.0000 mg | DELAYED_RELEASE_TABLET | Freq: Every day | ORAL | 0 refills | Status: DC

## 2019-04-10 MED ORDER — FAMOTIDINE 20 MG PO TABS
20.0000 mg | ORAL_TABLET | Freq: Once | ORAL | Status: AC
  Administered 2019-04-10: 20 mg via ORAL
  Filled 2019-04-10: qty 1

## 2019-04-10 MED ORDER — ACETAMINOPHEN 500 MG PO TABS
1000.0000 mg | ORAL_TABLET | Freq: Once | ORAL | Status: AC
  Administered 2019-04-10: 1000 mg via ORAL
  Filled 2019-04-10: qty 2

## 2019-04-10 NOTE — Discharge Instructions (Addendum)
It was our pleasure to provide your ER care today - we hope that you feel better.  Take acid blocker medication as prescribed. You may also try pepcid or maalox as need for symptom relief.  The lab tests show a possible urine infection - take antibiotic as prescribed.   You were given pain medication in the ER  - no driving for the next 6 hours.   Follow up with primary care doctor in the coming week.  Return to ER if worse, new symptoms, fevers, worsening or severe pain, persistent vomiting, other concern.

## 2019-04-10 NOTE — ED Notes (Addendum)
Pt states "pain is a little better. It is now a 11. It is moving around to my back and is only when I move." MD informed

## 2019-04-11 LAB — URINE CULTURE

## 2019-04-23 ENCOUNTER — Emergency Department (HOSPITAL_COMMUNITY)
Admission: EM | Admit: 2019-04-23 | Discharge: 2019-04-23 | Disposition: A | Discharge status: Home / Self Care | Payer: Medicaid Other | Attending: Emergency Medicine | Admitting: Emergency Medicine

## 2019-04-23 ENCOUNTER — Other Ambulatory Visit: Payer: Self-pay

## 2019-04-23 ENCOUNTER — Encounter (HOSPITAL_COMMUNITY): Payer: Self-pay | Admitting: Emergency Medicine

## 2019-04-23 DIAGNOSIS — J45909 Unspecified asthma, uncomplicated: Secondary | ICD-10-CM | POA: Diagnosis not present

## 2019-04-23 DIAGNOSIS — F17210 Nicotine dependence, cigarettes, uncomplicated: Secondary | ICD-10-CM | POA: Insufficient documentation

## 2019-04-23 DIAGNOSIS — R1013 Epigastric pain: Secondary | ICD-10-CM | POA: Diagnosis present

## 2019-04-23 DIAGNOSIS — Z79899 Other long term (current) drug therapy: Secondary | ICD-10-CM | POA: Insufficient documentation

## 2019-04-23 LAB — COMPREHENSIVE METABOLIC PANEL
ALT: 38 U/L (ref 0–44)
AST: 28 U/L (ref 15–41)
Albumin: 4.1 g/dL (ref 3.5–5.0)
Alkaline Phosphatase: 106 U/L (ref 38–126)
Anion gap: 10 (ref 5–15)
BUN: 9 mg/dL (ref 6–20)
CO2: 22 mmol/L (ref 22–32)
Calcium: 9.5 mg/dL (ref 8.9–10.3)
Chloride: 105 mmol/L (ref 98–111)
Creatinine, Ser: 0.57 mg/dL (ref 0.44–1.00)
GFR calc Af Amer: >60 mL/min (ref >60)
GFR calc non Af Amer: >60 mL/min (ref >60)
Glucose, Bld: 99 mg/dL (ref 70–99)
Potassium: 3.9 mmol/L (ref 3.5–5.1)
Sodium: 137 mmol/L (ref 135–145)
Total Bilirubin: 0.4 mg/dL (ref 0.3–1.2)
Total Protein: 7.6 g/dL (ref 6.5–8.1)

## 2019-04-23 LAB — URINALYSIS, ROUTINE W REFLEX MICROSCOPIC
Bilirubin Urine: NEGATIVE
Glucose, UA: NEGATIVE mg/dL
Hgb urine dipstick: NEGATIVE
Ketones, ur: NEGATIVE mg/dL
Leukocytes,Ua: NEGATIVE
Nitrite: NEGATIVE
Protein, ur: NEGATIVE mg/dL
Specific Gravity, Urine: 1.009 (ref 1.005–1.030)
pH: 6 (ref 5.0–8.0)

## 2019-04-23 LAB — POC URINE PREG, ED: Preg Test, Ur: NEGATIVE

## 2019-04-23 LAB — CBC
HCT: 41 % (ref 36.0–46.0)
Hemoglobin: 12.7 g/dL (ref 12.0–15.0)
MCH: 25.6 pg — ABNORMAL LOW (ref 26.0–34.0)
MCHC: 31 g/dL (ref 30.0–36.0)
MCV: 82.7 fL (ref 80.0–100.0)
Platelets: 212 10*3/uL (ref 150–400)
RBC: 4.96 MIL/uL (ref 3.87–5.11)
RDW: 15.3 % (ref 11.5–15.5)
WBC: 7.5 10*3/uL (ref 4.0–10.5)
nRBC: 0 % (ref 0.0–0.2)

## 2019-04-23 LAB — LIPASE, BLOOD: Lipase: 31 U/L (ref 11–51)

## 2019-04-23 MED ORDER — HYDROCODONE-ACETAMINOPHEN 5-325 MG PO TABS
1.0000 | ORAL_TABLET | Freq: Once | ORAL | Status: AC
  Administered 2019-04-23: 1 via ORAL
  Filled 2019-04-23: qty 1

## 2019-04-23 MED ORDER — PANTOPRAZOLE SODIUM 20 MG PO TBEC: 20.0000 mg | DELAYED_RELEASE_TABLET | Freq: Two times a day (BID) | ORAL | 0 refills | Status: DC

## 2019-04-23 MED ORDER — ONDANSETRON 8 MG PO TBDP
8.0000 mg | ORAL_TABLET | Freq: Once | ORAL | Status: AC
  Administered 2019-04-23: 8 mg via ORAL
  Filled 2019-04-23: qty 1

## 2019-04-23 MED ORDER — PROMETHAZINE HCL 25 MG PO TABS: 25.0000 mg | ORAL_TABLET | Freq: Four times a day (QID) | ORAL | 0 refills | Status: DC | PRN

## 2019-04-23 NOTE — Discharge Instructions (Addendum)
Take the prescribed medications as directed.  Try to stick with a bland diet, avoid greasy spicy or fatty foods.  Call the gastroenterologist listed to arrange a follow-up appointment.

## 2019-04-23 NOTE — ED Triage Notes (Signed)
Pt reports right upper quad pain for several weeks with nausea intermittently.

## 2019-04-24 NOTE — ED Provider Notes (Signed)
[facility] EMERGENCY DEPARTMENT Provider Note   CSN: [id number] Arrival date & time: 04/23/19  [id number]     History Chief Complaint  Patient presents with  . Abdominal Pain    Stacey Cook is a 23 y.o. female.  HPI    Stacey Cook is a 23 y.o. female with previous cholecystectomy who presents to the Emergency Department complaining of continued right upper and epigastric pain.  Pain has been associated with nausea that waxes and wanes.  Symptoms have been present for several weeks.  She was seen here last month for same.  States pain improves temporarily, but has never resolved.  She has not followed up with GI since previous visits.  She denies vomiting, diarrhea, fever, flank pain.  She had a CT abd/pelvis on previous visit that was negative for acute findings.     Past Medical History:  Diagnosis Date  . Abdominal pain   . ADD (attention deficit disorder)   . Anemia   . Anginal pain ([place])   . Anxiety   . Arthritis    knees  . Asthma   . 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 Cervicalgia   . Cholestasis during pregnancy   . Chronic abdominal pain   . Complication of anesthesia    light headed  . Constipation   . Depression    not on meds  . Depression with anxiety 06/13/2017   06/13/17 rx zoloft 25mg  daily           06/06/18 w/ occ SI, rx Lexapro 10mg , [facility] for therapy  . Ear mass   . Gestational diabetes    pt states not been checking sugars regularly at home; nor has she been taking her Metformin  . Gestational diabetes   . Headache   . 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 HSV infection   . Low iron   . PONV (postoperative nausea and vomiting)   . Suicidal intent   . UTI (lower urinary tract infection) 05/2014  . Vomiting     Patient Active Problem List   Diagnosis Date Noted  . Depression, postpartum 02/09/2019  . Epidermal cyst 02/09/2019  . Status post cesarean section 01/03/2019  . Ovarian cyst  07/04/2018  . 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 Gastroesophageal reflux disease   . Acute gallstone pancreatitis 01/12/2018  . Pancreatitis 01/12/2018  . History of shoulder dystocia in prior pregnancy 12/02/2017  . History of postpartum hemorrhage 12/02/2017  . History of cholestasis during pregnancy 09/21/2017  . History of gestational diabetes 08/24/2017  . Chest pain due to GERD 08/05/2017  . Depression with anxiety and suicidal ideations 06/13/2017  . History of miscarriage 01/18/2017  . Conductive hearing loss, unilat, unrestrict hearing contralateral side 12/10/2015  . 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 Asthma, mild intermittent 02/18/2015  . 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 Perforation of left tympanic membrane 06/19/2014  . Cervicalgia 12/20/2013  . Abdominal pain, chronic, epigastric 08/08/2013  . ADHD (attention deficit hyperactivity disorder), combined type 08/01/2012  . ODD (oppositional defiant disorder) 08/01/2012  . Vasovagal syncope 02/28/2012  . Cholesteatoma of attic of right ear 02/15/2011    Past Surgical History:  Procedure Laterality Date  . CESAREAN SECTION N/A  01/03/2019   Procedure: CESAREAN SECTION;  Surgeon: [name], MD;  Location: [facility];  Service: Obstetrics;  Laterality: N/A;  . CHOLECYSTECTOMY N/A 01/16/2018   Procedure: LAPAROSCOPIC CHOLECYSTECTOMY;  Surgeon: Aviva Signs, MD;  Location: [facility];  Service: General;  Laterality: N/A;  per Dr. Arnoldo Morale, pt will most likely go home and he will tell her to arrive at 10:45 - already has labs  . CHOLESTEATOMA EXCISION    . DILATION AND EVACUATION N/A 09/11/2016   Procedure: DILATATION AND CURRATAGE  2ND TRIMESTER;  Surgeon: Jonnie Kind, MD;  Location: [facility];  Service: Gynecology;   Laterality: N/A;  . IMPLANTATION BONE ANCHORED HEARING AID Right 04/2013  . INNER EAR SURGERY Right 03/31/2018  . 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 TYMPANOPLASTY Left   . TYMPANOSTOMY       OB History    Gravida  3   Para  2   Term  2   Preterm  0   AB  1   Living  2     SAB  1   TAB  0   Ectopic  0   Multiple  0   Live Births  2           Family History  Problem Relation Age of Onset  . Cholelithiasis Mother   . Kidney disease Mother        stones  . Depression Mother   . Hypertension Maternal Grandmother   . Diabetes Maternal Grandmother   . Stroke Maternal Grandmother   . Seizures Maternal Grandmother   . Asthma Maternal Grandmother   . Hyperlipidemia Maternal Grandmother   . Thyroid disease Maternal Grandmother   . Asthma Brother   . Seizures Maternal Uncle   . Cancer Other        breast- great aunt  . Celiac disease Neg Hx     Social History   Tobacco Use  . Smoking status: Current Some Day Smoker    Packs/day: 0.50    Years: 1.00    Pack years: 0.50    Types: Cigarettes    Last attempt to quit: 03/11/2017    Years since quitting: 2.1  . Smokeless tobacco: Never Used  Substance Use Topics  . Alcohol use: No    Alcohol/week: 0.0 standard drinks  . Drug use: No    Home Medications Prior to Admission medications   Medication Sig Start Date End Date Taking? Authorizing Provider  acetaminophen (TYLENOL) 500 MG tablet Take 500 mg by mouth as needed.    [provider]  ciprofloxacin (CIPRO) 500 MG tablet Take 1 tablet (500 mg total) by mouth 2 (two) times daily. 04/10/19   Lajean Saver, MD  cyclobenzaprine (FLEXERIL) 10 MG tablet Take 1 tablet (10 mg total) by mouth 3 (three) times daily as needed for muscle spasms. May use 1/2 tablet for mild back pain. 10/29/18   Burleson, Rona Ravens, NP  ferrous sulfate 325 (65 FE) MG tablet Take 1 tablet (325 mg total) by mouth 2 (two) times daily with a meal. Patient  taking differently: Take 325 mg by mouth daily.  12/11/18   Roma Schanz, CNM  ibuprofen (ADVIL) 800 MG tablet Take 1 tablet (800 mg total) by mouth every 6 (six) hours. Patient taking differently: Take 800 mg by mouth as needed.  01/05/19   Seabron Spates, CNM  LO LOESTRIN FE 1 MG-10 MCG / 10 MCG tablet Take  1 tablet by mouth daily. 03/07/19   Roma Schanz, CNM  norethindrone (MICRONOR) 0.35 MG tablet Take 1 tablet (0.35 mg total) by mouth daily. 02/09/19   Estill Dooms, NP  pantoprazole (PROTONIX) 20 MG tablet Take 1 tablet (20 mg total) by mouth 2 (two) times daily. 04/23/19   Maicee Ullman, PA-C  pantoprazole (PROTONIX) 40 MG tablet Take 1 tablet (40 mg total) by mouth daily. 04/10/19   Lajean Saver, MD  promethazine (PHENERGAN) 25 MG tablet Take 1 tablet (25 mg total) by mouth every 6 (six) hours as needed for nausea or vomiting. 04/23/19   Kiran Carline, PA-C  venlafaxine XR (EFFEXOR-XR) 150 MG 24 hr capsule Take 1 capsule (150 mg total) by mouth daily with breakfast. 02/09/19   Estill Dooms, NP    Allergies    Keflex [cephalexin], Other, and Adhesive [tape]  Review of Systems   Review of Systems  Constitutional: Negative for appetite change, chills and fever.  Respiratory: Negative for shortness of breath.   Cardiovascular: Negative for chest pain.  Gastrointestinal: Positive for abdominal pain and nausea. Negative for abdominal distention, blood in stool and vomiting.  Genitourinary: Negative for decreased urine volume, difficulty urinating, dysuria and flank pain.  Musculoskeletal: Negative for back pain.  Skin: Negative for color change and rash.  Neurological: Negative for dizziness, weakness and numbness.  Hematological: Negative for adenopathy.    Physical Exam Updated Vital Signs BP 114/64   Pulse 77   Temp 97.8 F (36.6 C) (Oral)   Resp 18   Ht 4\' 11"  (1.499 m)   Wt 85.3 kg   LMP 04/04/2019   SpO2 99%   BMI 37.97 kg/m   Physical  Exam Vitals and nursing note reviewed.  Constitutional:      General: She is not in acute distress.    Appearance: She is well-developed. She is not ill-appearing.  HENT:     Head: Atraumatic.  Cardiovascular:     Rate and Rhythm: Normal rate and regular rhythm.     Heart sounds: Normal heart sounds.  Pulmonary:     Effort: Pulmonary effort is normal. No respiratory distress.     Breath sounds: Normal breath sounds.  Chest:     Chest wall: No tenderness.  Abdominal:     General: Bowel sounds are normal. There is no distension.     Palpations: Abdomen is soft. There is no mass.     Tenderness: There is abdominal tenderness. There is no guarding or rebound.     Comments: ttp of the epigastric region.  No guarding or rebound tenderness.  abd is soft.  No peritoneal signs  Musculoskeletal:        General: Normal range of motion.  Skin:    General: Skin is warm and dry.  Neurological:     Mental Status: She is alert and oriented to person, place, and time.     Motor: No abnormal muscle tone.     Coordination: Coordination normal.     ED Results / Procedures / Treatments   Labs (all labs ordered are listed, but only abnormal results are displayed) Labs Reviewed  CBC - Abnormal; Notable for the following components:      Result Value   MCH 25.6 (*)    All other components within normal limits  LIPASE, BLOOD  COMPREHENSIVE METABOLIC PANEL  URINALYSIS, ROUTINE W REFLEX MICROSCOPIC  POC URINE PREG, ED    EKG None  Radiology No results found.  Procedures Procedures (  including critical care time)  Medications Ordered in ED Medications  ondansetron (ZOFRAN-ODT) disintegrating tablet 8 mg (8 mg Oral Given 04/23/19 1000)  HYDROcodone-acetaminophen (NORCO/VICODIN) 5-325 MG per tablet 1 tablet (1 tablet Oral Given 04/23/19 1000)    ED Course  I have reviewed the triage vital signs and the nursing notes.  Pertinent labs & imaging results that were available during my care  of the patient were reviewed by me and considered in my medical decision making (see chart for details).    MDM Rules/Calculators/A&P                      Pt with epigastric pain, prior cholecystectomy.  Labs reassuring.  No hx of fever, vomiting.  Pain likely related to GERD.  Doubt acute abdomen.  Pt appears appropriate for d/c home, rx for anti-emetic and PPI.  Referral given for GI   Final Clinical Impression(s) / ED Diagnoses Final diagnoses:  Epigastric pain    Rx / DC Orders ED Discharge Orders         Ordered    promethazine (PHENERGAN) 25 MG tablet  Every 6 hours PRN     04/23/19 1138    pantoprazole (PROTONIX) 20 MG tablet  2 times daily     04/23/19 1138           Kem Parkinson, PA-C 04/24/19 2153    Dorie Rank, MD 04/26/19 [phone]

## 2019-04-26 ENCOUNTER — Telehealth: Payer: Self-pay | Admitting: *Deleted

## 2019-04-26 NOTE — Telephone Encounter (Signed)
Patient states she took a pregnancy test and it is positive. Concerned as she just had a baby 3 months ago.  She is unsure of her LMP. Did miss a couple of pills in early December. Advised to keep appt as scheduled for tomorrow and we will go from there. Pt verbalized understanding.

## 2019-04-27 ENCOUNTER — Other Ambulatory Visit: Payer: Self-pay

## 2019-04-27 ENCOUNTER — Ambulatory Visit (INDEPENDENT_AMBULATORY_CARE_PROVIDER_SITE_OTHER): Payer: Medicaid Other | Admitting: *Deleted

## 2019-04-27 ENCOUNTER — Encounter: Payer: Self-pay | Admitting: *Deleted

## 2019-04-27 VITALS — BP 136/69 | HR 103 | Ht 59.0 in | Wt 181.0 lb

## 2019-04-27 DIAGNOSIS — Z3200 Encounter for pregnancy test, result unknown: Secondary | ICD-10-CM

## 2019-04-27 DIAGNOSIS — Z3202 Encounter for pregnancy test, result negative: Secondary | ICD-10-CM | POA: Diagnosis not present

## 2019-04-27 LAB — POCT URINE PREGNANCY: Preg Test, Ur: NEGATIVE

## 2019-04-27 NOTE — Progress Notes (Addendum)
   NURSE VISIT- PREGNANCY CONFIRMATION   SUBJECTIVE:  Stacey Cook is a 23 y.o. [id number] female at Unknown by uncertain LMP of Patient's last menstrual period was 04/04/2019. Here for pregnancy confirmation.  Home pregnancy test: positive x 3  She reports nausea and vomiting.  She is taking prenatal vitamins.    OBJECTIVE:  BP 136/69 (BP Location: Left Arm, Patient Position: Sitting, Cuff Size: Normal)   Pulse (!) 103   Ht 4\' 11"  (1.499 m)   Wt 181 lb (82.1 kg)   LMP 04/04/2019   Breastfeeding No   BMI 36.56 kg/m   Appears well, in no apparent distress OB History  Gravida Para Term Preterm AB Living  3 2 2  0 1 2  SAB TAB Ectopic Multiple Live Births  1 0 0 0 2    # Outcome Date GA Lbr Len/2nd Weight Sex Delivery Anes PTL Lv  3 Term 01/03/19 [redacted]w[redacted]d  8 lb 3 oz (3.714 kg) F CS-LTranv   LIV  2 Term 10/28/17 [redacted]w[redacted]d 10:29 / 01:39 7 lb 4.8 oz (3.311 kg) M Vag-Spont EPI  LIV     Complications: Shoulder Dystocia  1 SAB 09/11/16 [redacted]w[redacted]d           Results for orders placed or performed in visit on 04/27/19 (from the past 24 hour(s))  POCT urine pregnancy   Collection Time: 04/27/19 11:31 AM  Result Value Ref Range   Preg Test, Ur Negative Negative    ASSESSMENT: Negative pregnancy test    PLAN:  Prenatal vitamins: continue.Negative pregnancy test in office; Quant ordered @ lab.  Nausea medicines: not needed   OB packet given: No  Levy Pupa  04/27/2019 11:41 AM   Attestation of Attending Supervision of Nursing Visit Encounter: Evaluation and management procedures were performed by the nursing staff under my supervision and collaboration.  I have reviewed the nurse's note and chart, and I agree with the management and plan.  Jacelyn Grip MD Attending Physician for the [facility] 04/27/2019 12:44 PM

## 2019-04-28 LAB — BETA HCG QUANT (REF LAB): hCG Quant: 9 m[IU]/mL

## 2019-04-30 ENCOUNTER — Telehealth: Payer: Self-pay | Admitting: *Deleted

## 2019-04-30 ENCOUNTER — Other Ambulatory Visit: Payer: Self-pay

## 2019-04-30 ENCOUNTER — Other Ambulatory Visit: Payer: Medicaid Other

## 2019-04-30 DIAGNOSIS — N926 Irregular menstruation, unspecified: Secondary | ICD-10-CM

## 2019-04-30 DIAGNOSIS — Z3202 Encounter for pregnancy test, result negative: Secondary | ICD-10-CM

## 2019-04-30 DIAGNOSIS — Z3200 Encounter for pregnancy test, result unknown: Secondary | ICD-10-CM

## 2019-04-30 NOTE — Telephone Encounter (Signed)
Pt left message that she would like her lab results.

## 2019-04-30 NOTE — Progress Notes (Signed)
Spoke to patient and informed HCG 9.  Will repeat HCG.

## 2019-05-01 ENCOUNTER — Telehealth: Payer: Self-pay | Admitting: *Deleted

## 2019-05-01 LAB — BETA HCG QUANT (REF LAB): hCG Quant: 12 m[IU]/mL

## 2019-05-01 NOTE — Telephone Encounter (Signed)
Pt requesting hcg results.

## 2019-05-02 ENCOUNTER — Telehealth: Payer: Self-pay | Admitting: Obstetrics & Gynecology

## 2019-05-02 NOTE — Telephone Encounter (Signed)
Patient called, would like her lab results.    [phone] - best time is now - 10am, she has several appointments today, feel free to leave a message.

## 2019-05-02 NOTE — Telephone Encounter (Signed)
Patient informed HCG is not rising appropriately and after discussing with Dr Glo Herring, looks as though this could be a chemical pregnancy.  Patient states she is spotting a little today but not heavy.  Advised bleeding could get heavier.  Will recheck HCG in 1 week. Pt verbalized understanding.

## 2019-05-09 ENCOUNTER — Other Ambulatory Visit: Payer: Self-pay

## 2019-05-10 ENCOUNTER — Telehealth: Payer: Self-pay | Admitting: *Deleted

## 2019-05-10 LAB — BETA HCG QUANT (REF LAB): hCG Quant: <1 m[IU]/mL

## 2019-05-10 NOTE — Telephone Encounter (Signed)
Pt aware pregnancy test is negative. Pt voiced understanding. [place]

## 2019-05-10 NOTE — Progress Notes (Signed)
Pregnancy test now negative.

## 2019-05-10 NOTE — Telephone Encounter (Signed)
-----   Message from Jonnie Kind, MD sent at 05/10/2019  8:08 AM EST ----- Pregnancy test now negative.

## 2019-05-11 NOTE — L&D Delivery Note (Signed)
OB/GYN Faculty Practice Delivery Note  Stacey Cook is a 24 y.o. [id number] s/p VD, IOL for cholestasis of pregnancy at [redacted]w[redacted]d.   ROM: 8h 64m with clear fluid GBS Status: Positive/-- (09/27 1630) (PCN) Maximum Maternal Temperature: 98.9 F  Labor Progress: . Patient presented to L&D for IOL. Labor course was uncomplicated. Augmented with FB, and pitocin. She then progressed to complete.   Delivery Date/Time: 02/12/20 @ 2111 Delivery: Called to room and patient was complete and pushing. Head position was vertex and delivered with ease over the perineum. No nuchal cord present. Shoulder and body delivered in usual fashion. Infant with spontaneous cry, placed on mother's abdomen, dried and stimulated. Cord clamped x 2 after 1-minute delay, and cut by Dr. Domenick Gong. Cord blood drawn. Placenta delivered spontaneously with gentle cord traction. Fundus firm with massage and pitocin started. Labia, perineum, vagina, and cervix inspected and significant for bilateral hemostatic periurethral tears.  Baby Weight: per chart  Cord: central insertion, 3 vessel Placenta: Sent to L&D, intact Complications: None Lacerations: hemostatic bilateral periurethral tears  EBL: 100 cc Analgesia: Epidural   Infant: APGAR (1 MIN): 9  APGAR (5 MINS): 9   Talitha Givens MD, PGY-1 OBGYN Faculty Teaching Service  02/12/2020, 9:29 PM

## 2019-06-13 ENCOUNTER — Other Ambulatory Visit: Payer: Self-pay

## 2019-06-13 ENCOUNTER — Ambulatory Visit: Payer: Medicaid Other | Attending: Internal Medicine

## 2019-06-13 DIAGNOSIS — Z20822 Contact with and (suspected) exposure to covid-19: Secondary | ICD-10-CM

## 2019-06-14 LAB — NOVEL CORONAVIRUS, NAA: SARS-CoV-2, NAA: NOT DETECTED

## 2019-06-20 ENCOUNTER — Telehealth: Payer: Self-pay | Admitting: *Deleted

## 2019-06-20 NOTE — Telephone Encounter (Signed)
Patient left message requesting HCG. States she has taken several pregnancy tests,some positive and some negative.    Returned call.  Advised I spoke with Jenn and she can get HCG.  Advised to call back and schedule lab work.

## 2019-06-22 ENCOUNTER — Other Ambulatory Visit: Payer: Medicaid Other

## 2019-06-26 ENCOUNTER — Emergency Department (HOSPITAL_COMMUNITY)
Admission: EM | Admit: 2019-06-26 | Discharge: 2019-06-26 | Disposition: A | Discharge status: Home / Self Care | Payer: Medicaid Other | Attending: Emergency Medicine | Admitting: Emergency Medicine

## 2019-06-26 ENCOUNTER — Other Ambulatory Visit: Payer: Self-pay

## 2019-06-26 ENCOUNTER — Encounter (HOSPITAL_COMMUNITY): Payer: Self-pay

## 2019-06-26 DIAGNOSIS — Y939 Activity, unspecified: Secondary | ICD-10-CM | POA: Insufficient documentation

## 2019-06-26 DIAGNOSIS — Y929 Unspecified place or not applicable: Secondary | ICD-10-CM | POA: Insufficient documentation

## 2019-06-26 DIAGNOSIS — Z3201 Encounter for pregnancy test, result positive: Secondary | ICD-10-CM | POA: Diagnosis not present

## 2019-06-26 DIAGNOSIS — X58XXXA Exposure to other specified factors, initial encounter: Secondary | ICD-10-CM | POA: Insufficient documentation

## 2019-06-26 DIAGNOSIS — Y999 Unspecified external cause status: Secondary | ICD-10-CM | POA: Diagnosis not present

## 2019-06-26 DIAGNOSIS — J45909 Unspecified asthma, uncomplicated: Secondary | ICD-10-CM | POA: Insufficient documentation

## 2019-06-26 DIAGNOSIS — M546 Pain in thoracic spine: Secondary | ICD-10-CM

## 2019-06-26 DIAGNOSIS — Z3A00 Weeks of gestation of pregnancy not specified: Secondary | ICD-10-CM | POA: Diagnosis not present

## 2019-06-26 DIAGNOSIS — O99330 Smoking (tobacco) complicating pregnancy, unspecified trimester: Secondary | ICD-10-CM | POA: Insufficient documentation

## 2019-06-26 DIAGNOSIS — F17210 Nicotine dependence, cigarettes, uncomplicated: Secondary | ICD-10-CM | POA: Diagnosis not present

## 2019-06-26 DIAGNOSIS — O99519 Diseases of the respiratory system complicating pregnancy, unspecified trimester: Secondary | ICD-10-CM | POA: Diagnosis not present

## 2019-06-26 DIAGNOSIS — Z9049 Acquired absence of other specified parts of digestive tract: Secondary | ICD-10-CM | POA: Diagnosis not present

## 2019-06-26 DIAGNOSIS — O9A219 Injury, poisoning and certain other consequences of external causes complicating pregnancy, unspecified trimester: Secondary | ICD-10-CM | POA: Diagnosis present

## 2019-06-26 DIAGNOSIS — S29012A Strain of muscle and tendon of back wall of thorax, initial encounter: Secondary | ICD-10-CM | POA: Diagnosis not present

## 2019-06-26 DIAGNOSIS — T148XXA Other injury of unspecified body region, initial encounter: Secondary | ICD-10-CM

## 2019-06-26 LAB — POC URINE PREG, ED: Preg Test, Ur: NEGATIVE

## 2019-06-26 LAB — PREGNANCY, URINE: Preg Test, Ur: POSITIVE — AB

## 2019-06-26 NOTE — ED Provider Notes (Signed)
[facility] Emergency Department Provider Note MRN:  [id number]  Arrival date & time: 06/26/19     Chief Complaint   Back Pain   History of Present Illness   Stacey Cook is a 24 y.o. year-old female with no pertinent past medical history presenting to the ED with chief complaint of back pain.  3 weeks of right thoracic back pain, worse with motion or palpation, worse with certain positions.  Denies bowel or bladder dysfunction, no numbness or weakness.  Denies chest pain or shortness of breath, no abdominal pain.  Also wondering if she is pregnant.  Positive pregnant test at home.  Review of Systems  A complete 10 system review of systems was obtained and all systems are negative except as noted in the HPI and PMH.   Patient's Health History    Past Medical History:  Diagnosis Date  . Abdominal pain   . ADD (attention deficit disorder)   . Anemia   . Anginal pain ([place])   . Anxiety   . Arthritis    knees  . Asthma   . 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 Cervicalgia   . Cholestasis during pregnancy   . Chronic abdominal pain   . Complication of anesthesia    light headed  . Constipation   . Depression    not on meds  . Depression with anxiety 06/13/2017   06/13/17 rx zoloft 25mg  daily           06/06/18 w/ occ SI, rx Lexapro 10mg , [facility] for therapy  . Ear mass   . Gestational diabetes    pt states not been checking sugars regularly at home; nor has she been taking her Metformin  . Gestational diabetes   . Headache   . 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 HSV infection   . Low iron   . PONV (postoperative nausea and vomiting)   . Suicidal intent   . UTI (lower urinary tract infection) 05/2014  . Vomiting     Past Surgical History:  Procedure Laterality Date  . CESAREAN SECTION N/A 01/03/2019   Procedure: CESAREAN SECTION;  Surgeon: [name], MD;  Location: [facility];  Service:  Obstetrics;  Laterality: N/A;  . CHOLECYSTECTOMY N/A 01/16/2018   Procedure: LAPAROSCOPIC CHOLECYSTECTOMY;  Surgeon: Aviva Signs, MD;  Location: [facility];  Service: General;  Laterality: N/A;  per Dr. Arnoldo Morale, pt will most likely go home and he will tell her to arrive at 10:45 - already has labs  . CHOLESTEATOMA EXCISION    . DILATION AND EVACUATION N/A 09/11/2016   Procedure: DILATATION AND CURRATAGE  2ND TRIMESTER;  Surgeon: Jonnie Kind, MD;  Location: [facility];  Service: Gynecology;  Laterality: N/A;  . IMPLANTATION BONE ANCHORED HEARING AID Right 04/2013  . INNER EAR SURGERY Right 03/31/2018  . 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 TYMPANOPLASTY Left   . TYMPANOSTOMY      Family History  Problem Relation Age of Onset  . Cholelithiasis Mother   . Kidney disease Mother        stones  . Depression Mother   . Hypertension Maternal Grandmother   . Diabetes Maternal Grandmother   . Stroke Maternal Grandmother   . Seizures Maternal Grandmother   . Asthma Maternal Grandmother   . Hyperlipidemia Maternal Grandmother   . Thyroid disease Maternal Grandmother   .  Asthma Brother   . Seizures Maternal Uncle   . Cancer Other        breast- great aunt  . Celiac disease Neg Hx     Social History   Socioeconomic History  . Marital status: Married    Spouse name: Franco Collet  . Number of children: 1  . Years of education: Not on file  . Highest education level: Not on file  Occupational History  . Not on file  Tobacco Use  . Smoking status: Current Every Day Smoker    Packs/day: 0.50    Years: 1.00    Pack years: 0.50    Types: Cigarettes    Last attempt to quit: 03/11/2017    Years since quitting: 2.2  . Smokeless tobacco: Never Used  Substance and Sexual Activity  . Alcohol use: No    Alcohol/week: 0.0 standard drinks  . Drug use: No  . Sexual activity: Yes    Birth control/protection: Pill  Other Topics Concern  . Not on file  Social History Narrative     [employer]   Is smoker herself            Social Determinants of Radio broadcast assistant Strain:   . Difficulty of Paying Living Expenses: Not on file  Food Insecurity:   . Worried About Charity fundraiser in the Last Year: Not on file  . Ran Out of Food in the Last Year: Not on file  Transportation Needs:   . Lack of Transportation (Medical): Not on file  . Lack of Transportation (Non-Medical): Not on file  Physical Activity:   . Days of Exercise per Week: Not on file  . Minutes of Exercise per Session: Not on file  Stress:   . Feeling of Stress : Not on file  Social Connections:   . Frequency of Communication with Friends and Family: Not on file  . Frequency of Social Gatherings with Friends and Family: Not on file  . Attends Religious Services: Not on file  . Active Member of Clubs or Organizations: Not on file  . Attends Archivist Meetings: Not on file  . Marital Status: Not on file  Intimate Partner Violence:   . Fear of Current or Ex-Partner: Not on file  . Emotionally Abused: Not on file  . Physically Abused: Not on file  . Sexually Abused: Not on file     Physical Exam   Vitals:   06/26/19 1229  BP: 113/62  Pulse: 93  Resp: 16  Temp: 97.9 F (36.6 C)  SpO2: 96%    CONSTITUTIONAL: Well-appearing, NAD NEURO:  Alert and oriented x 3, no focal deficits EYES:  eyes equal and reactive ENT/NECK:  no LAD, no JVD CARDIO: Regular rate, well-perfused, normal S1 and S2 PULM:  CTAB no wheezing or rhonchi GI/GU:  normal bowel sounds, non-distended, non-tender MSK/SPINE:  No gross deformities, no edema; reproducible tenderness to the right thoracic back SKIN:  no rash, atraumatic PSYCH:  Appropriate speech and behavior  *Additional and/or pertinent findings included in MDM below  Diagnostic and Interventional Summary    EKG Interpretation  Date/Time:    Ventricular Rate:    PR Interval:    QRS Duration:   QT Interval:    QTC  Calculation:   R Axis:     Text Interpretation:        Cardiac Monitoring Interpretation:  Labs Reviewed  PREGNANCY, URINE - Abnormal; Notable for the following components:  Result Value   Preg Test, Ur POSITIVE (*)    All other components within normal limits  POC URINE PREG, ED    No orders to display    Medications - No data to display   Procedures  /  Critical Care Procedures  ED Course and Medical Decision Making  I have reviewed the triage vital signs, the nursing notes, and pertinent available records from the EMR.  Pertinent labs & imaging results that were available during my care of the patient were reviewed by me and considered in my medical decision making (see below for details).     Consistent with musculoskeletal back pain, no shortness of breath, no chest pain, no hypoxia, no tachycardia, nothing to suggest PE.  No CVA tenderness to suggest genitourinary etiology.  Awaiting pregnancy test to determine pain management options.  1:28 PM update: Pregnancy test is positive, advised Tylenol, prenatal vitamins, OB follow-up.  Barth Kirks. [place]onia Small, [place] [email or web address]  Final Clinical Impressions(s) / ED Diagnoses     ICD-10-CM   1. Muscle strain  T14.8XXA   2. Acute right-sided thoracic back pain  M54.6   3. Positive pregnancy test  Z32.01     ED Discharge Orders    None       Discharge Instructions Discussed with and Provided to Patient:     Discharge Instructions     You were evaluated in the Emergency Department and after careful evaluation, we did not find any emergent condition requiring admission or further testing in the hospital.  Your exam/testing today is overall reassuring.  Your symptoms seem to be due to muscle strain or spasm.  Your pregnancy test has come back positive.  We recommend daily prenatal vitamins and follow-up with an OB/GYN doctor.  We recommend rest, warm  compresses, and Tylenol throughout the day for discomfort.  Please return to the Emergency Department if you experience any worsening of your condition.  We encourage you to follow up with a primary care provider.  Thank you for allowing [name] to be a part of your care.       Maudie Flakes, MD 06/26/19 1328

## 2019-06-26 NOTE — ED Triage Notes (Addendum)
Pt reports upper back pain, between her shoulders. No injury reported. Pain goes into right shoulder. Pain has increased over the past 2 weeks. Pt reports she may possibly be pregnant. Had a positive and negative test at home. Suppose to go to Drs office tommorrw

## 2019-06-26 NOTE — Discharge Instructions (Addendum)
You were evaluated in the Emergency Department and after careful evaluation, we did not find any emergent condition requiring admission or further testing in the hospital.  Your exam/testing today is overall reassuring.  Your symptoms seem to be due to muscle strain or spasm.  Your pregnancy test has come back positive.  We recommend daily prenatal vitamins and follow-up with an OB/GYN doctor.  We recommend rest, warm compresses, and Tylenol throughout the day for discomfort.  Please return to the Emergency Department if you experience any worsening of your condition.  We encourage you to follow up with a primary care provider.  Thank you for allowing [name] to be a part of your care.

## 2019-07-02 ENCOUNTER — Telehealth: Payer: Self-pay | Admitting: *Deleted

## 2019-07-02 NOTE — Telephone Encounter (Signed)
Called patient and heard message that vm is not set up.

## 2019-07-02 NOTE — Telephone Encounter (Signed)
Pt left message that she is having cramps and pressure. Also experiencing nausea and vomiting.

## 2019-07-03 ENCOUNTER — Other Ambulatory Visit: Payer: Self-pay | Admitting: Adult Health

## 2019-07-03 ENCOUNTER — Other Ambulatory Visit: Payer: Medicaid Other

## 2019-07-03 ENCOUNTER — Other Ambulatory Visit: Payer: Self-pay

## 2019-07-03 ENCOUNTER — Ambulatory Visit (INDEPENDENT_AMBULATORY_CARE_PROVIDER_SITE_OTHER): Payer: Medicaid Other

## 2019-07-03 DIAGNOSIS — R102 Pelvic and perineal pain: Secondary | ICD-10-CM | POA: Diagnosis not present

## 2019-07-03 DIAGNOSIS — Z3A01 Less than 8 weeks gestation of pregnancy: Secondary | ICD-10-CM

## 2019-07-03 DIAGNOSIS — O26891 Other specified pregnancy related conditions, first trimester: Secondary | ICD-10-CM

## 2019-07-03 DIAGNOSIS — O26899 Other specified pregnancy related conditions, unspecified trimester: Secondary | ICD-10-CM

## 2019-07-03 DIAGNOSIS — Z3490 Encounter for supervision of normal pregnancy, unspecified, unspecified trimester: Secondary | ICD-10-CM

## 2019-07-03 NOTE — Progress Notes (Signed)
[name] 5+2 wks ? GS within the fundus of the uterus,no YS visualized,normal right ovary,complex left corpus luteal cyst with color flow 2.6 x 1.9 x 2.2 cm,no free fluid,no pain during ultrasound,pt will get lab done today per Anderson Malta

## 2019-07-04 ENCOUNTER — Telehealth: Payer: Self-pay | Admitting: *Deleted

## 2019-07-04 LAB — BETA HCG QUANT (REF LAB): hCG Quant: 2832 m[IU]/mL

## 2019-07-04 NOTE — Telephone Encounter (Signed)
Pt left message that she saw results in mychart. Doesn't understand them. Had ultrasound and hcg drawn.

## 2019-07-18 ENCOUNTER — Other Ambulatory Visit: Payer: Medicaid Other

## 2019-07-20 ENCOUNTER — Telehealth: Payer: Self-pay | Admitting: *Deleted

## 2019-07-20 MED ORDER — PROMETHAZINE HCL 25 MG PO TABS: 25.0000 mg | ORAL_TABLET | Freq: Four times a day (QID) | ORAL | 1 refills | Status: DC | PRN

## 2019-07-20 NOTE — Telephone Encounter (Signed)
Pt requesting rx for nausea. Aware to check with pharmacy later today.

## 2019-07-20 NOTE — Telephone Encounter (Signed)
rx phenergan

## 2019-07-26 ENCOUNTER — Other Ambulatory Visit: Payer: Self-pay | Admitting: Obstetrics & Gynecology

## 2019-07-26 DIAGNOSIS — O3680X0 Pregnancy with inconclusive fetal viability, not applicable or unspecified: Secondary | ICD-10-CM

## 2019-07-27 ENCOUNTER — Ambulatory Visit (INDEPENDENT_AMBULATORY_CARE_PROVIDER_SITE_OTHER): Payer: Medicaid Other

## 2019-07-27 ENCOUNTER — Other Ambulatory Visit: Payer: Self-pay

## 2019-07-27 DIAGNOSIS — Z3A08 8 weeks gestation of pregnancy: Secondary | ICD-10-CM | POA: Diagnosis not present

## 2019-07-27 DIAGNOSIS — O3680X0 Pregnancy with inconclusive fetal viability, not applicable or unspecified: Secondary | ICD-10-CM

## 2019-07-27 NOTE — Progress Notes (Signed)
[name] 8+3 wks,single IUP with YS,normal ovaries,crl 21.34 mm,fhr 157 bpm

## 2019-08-14 ENCOUNTER — Other Ambulatory Visit: Payer: Self-pay

## 2019-08-14 ENCOUNTER — Encounter (HOSPITAL_COMMUNITY): Payer: Self-pay | Admitting: Obstetrics and Gynecology

## 2019-08-14 ENCOUNTER — Inpatient Hospital Stay (HOSPITAL_COMMUNITY)
Admission: AD | Admit: 2019-08-14 | Discharge: 2019-08-14 | Disposition: A | Discharge status: Home / Self Care | Payer: Medicaid Other | Attending: Obstetrics and Gynecology | Admitting: Obstetrics and Gynecology

## 2019-08-14 DIAGNOSIS — O99341 Other mental disorders complicating pregnancy, first trimester: Secondary | ICD-10-CM | POA: Insufficient documentation

## 2019-08-14 DIAGNOSIS — O210 Mild hyperemesis gravidarum: Secondary | ICD-10-CM

## 2019-08-14 DIAGNOSIS — Z881 Allergy status to other antibiotic agents status: Secondary | ICD-10-CM | POA: Diagnosis not present

## 2019-08-14 DIAGNOSIS — R109 Unspecified abdominal pain: Secondary | ICD-10-CM | POA: Insufficient documentation

## 2019-08-14 DIAGNOSIS — O99331 Smoking (tobacco) complicating pregnancy, first trimester: Secondary | ICD-10-CM | POA: Insufficient documentation

## 2019-08-14 DIAGNOSIS — Z79899 Other long term (current) drug therapy: Secondary | ICD-10-CM | POA: Insufficient documentation

## 2019-08-14 DIAGNOSIS — M545 Low back pain, unspecified: Secondary | ICD-10-CM

## 2019-08-14 DIAGNOSIS — O9A211 Injury, poisoning and certain other consequences of external causes complicating pregnancy, first trimester: Secondary | ICD-10-CM

## 2019-08-14 DIAGNOSIS — F17210 Nicotine dependence, cigarettes, uncomplicated: Secondary | ICD-10-CM | POA: Insufficient documentation

## 2019-08-14 DIAGNOSIS — O9A219 Injury, poisoning and certain other consequences of external causes complicating pregnancy, unspecified trimester: Secondary | ICD-10-CM

## 2019-08-14 DIAGNOSIS — F419 Anxiety disorder, unspecified: Secondary | ICD-10-CM | POA: Diagnosis not present

## 2019-08-14 DIAGNOSIS — O26891 Other specified pregnancy related conditions, first trimester: Secondary | ICD-10-CM | POA: Insufficient documentation

## 2019-08-14 DIAGNOSIS — G8929 Other chronic pain: Secondary | ICD-10-CM | POA: Diagnosis not present

## 2019-08-14 DIAGNOSIS — Z3A11 11 weeks gestation of pregnancy: Secondary | ICD-10-CM | POA: Diagnosis not present

## 2019-08-14 HISTORY — DX: Mental disorder, not otherwise specified: F99

## 2019-08-14 LAB — URINALYSIS, ROUTINE W REFLEX MICROSCOPIC
Bilirubin Urine: NEGATIVE
Glucose, UA: NEGATIVE mg/dL
Hgb urine dipstick: NEGATIVE
Ketones, ur: NEGATIVE mg/dL
Nitrite: NEGATIVE
Protein, ur: NEGATIVE mg/dL
Specific Gravity, Urine: 1.024 (ref 1.005–1.030)
pH: 6 (ref 5.0–8.0)

## 2019-08-14 MED ORDER — ACETAMINOPHEN 500 MG PO TABS
1000.0000 mg | ORAL_TABLET | Freq: Four times a day (QID) | ORAL | Status: DC | PRN
  Administered 2019-08-14: 12:00:00 1000 mg via ORAL
  Filled 2019-08-14: qty 2

## 2019-08-14 MED ORDER — SCOPOLAMINE 1 MG/3DAYS TD PT72: 1.0000 | MEDICATED_PATCH | TRANSDERMAL | 0 refills | Status: DC

## 2019-08-14 MED ORDER — CYCLOBENZAPRINE HCL 10 MG PO TABS: 10.0000 mg | ORAL_TABLET | Freq: Three times a day (TID) | ORAL | 0 refills | Status: DC | PRN

## 2019-08-14 MED ORDER — METOCLOPRAMIDE HCL 10 MG PO TABS
10.0000 mg | ORAL_TABLET | Freq: Four times a day (QID) | ORAL | Status: DC | PRN
  Administered 2019-08-14: 13:00:00 10 mg via ORAL
  Filled 2019-08-14: qty 1

## 2019-08-14 MED ORDER — METOCLOPRAMIDE HCL 10 MG PO TABS: 10.0000 mg | ORAL_TABLET | Freq: Four times a day (QID) | ORAL | 0 refills | Status: DC | PRN

## 2019-08-14 MED ORDER — SCOPOLAMINE 1 MG/3DAYS TD PT72
1.0000 | MEDICATED_PATCH | TRANSDERMAL | Status: DC
  Administered 2019-08-14: 1.5 mg via TRANSDERMAL
  Filled 2019-08-14: qty 1
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
## 2019-08-14 NOTE — Discharge Instructions (Signed)
Morning Sickness ° °Morning sickness is when you feel sick to your stomach (nauseous) during pregnancy. You may feel sick to your stomach and throw up (vomit). You may feel sick in the morning, but you can feel this way at any time of day. Some women feel very sick to their stomach and cannot stop throwing up (hyperemesis gravidarum). °Follow these instructions at home: °Medicines °· Take over-the-counter and prescription medicines only as told by your doctor. Do not take any medicines until you talk with your doctor about them first. °· Taking multivitamins before getting pregnant can stop or lessen the harshness of morning sickness. °Eating and drinking °· Eat dry toast or crackers before getting out of bed. °· Eat 5 or 6 small meals a day. °· Eat dry and bland foods like rice and baked potatoes. °· Do not eat greasy, fatty, or spicy foods. °· Have someone cook for you if the smell of food causes you to feel sick or throw up. °· If you feel sick to your stomach after taking prenatal vitamins, take them at night or with a snack. °· Eat protein when you need a snack. Nuts, yogurt, and cheese are good choices. °· Drink fluids throughout the day. °· Try ginger ale made with real ginger, ginger tea made from fresh grated ginger, or ginger candies. °General instructions °· Do not use any products that have nicotine or tobacco in them, such as cigarettes and e-cigarettes. If you need help quitting, ask your doctor. °· Use an air purifier to keep the air in your house free of smells. °· Get lots of fresh air. °· Try to avoid smells that make you feel sick. °· Try: °? Wearing a bracelet that is used for seasickness (acupressure wristband). °? Going to a doctor who puts thin needles into certain body points (acupuncture) to improve how you feel. °Contact a doctor if: °· You need medicine to feel better. °· You feel dizzy or light-headed. °· You are losing weight. °Get help right away if: °· You feel very sick to your  stomach and cannot stop throwing up. °· You pass out (faint). °· You have very bad pain in your belly. °Summary °· Morning sickness is when you feel sick to your stomach (nauseous) during pregnancy. °· You may feel sick in the morning, but you can feel this way at any time of day. °· Making some changes to what you eat may help your symptoms go away. °This information is not intended to replace advice given to you by your health care provider. Make sure you discuss any questions you have with your health care provider. °Document Revised: 04/08/2017 Document Reviewed: 05/27/2016 °Elsevier Patient Education © 2020 [employer] ° °

## 2019-08-14 NOTE — Progress Notes (Deleted)
GC/Chlamydia & wet prep cultures obtained by M. Drake Leach, CNM.

## 2019-08-22 ENCOUNTER — Other Ambulatory Visit: Payer: Self-pay | Admitting: Obstetrics & Gynecology

## 2019-08-22 DIAGNOSIS — Z3682 Encounter for antenatal screening for nuchal translucency: Secondary | ICD-10-CM

## 2019-08-23 ENCOUNTER — Ambulatory Visit (INDEPENDENT_AMBULATORY_CARE_PROVIDER_SITE_OTHER): Payer: Medicaid Other | Admitting: Women's Health

## 2019-08-23 ENCOUNTER — Other Ambulatory Visit (HOSPITAL_COMMUNITY)
Admission: RE | Admit: 2019-08-23 | Discharge: 2019-08-23 | Disposition: A | Discharge status: Home / Self Care | Payer: Medicaid Other | Source: Ambulatory Visit | Attending: Obstetrics & Gynecology | Admitting: Obstetrics & Gynecology

## 2019-08-23 ENCOUNTER — Ambulatory Visit (INDEPENDENT_AMBULATORY_CARE_PROVIDER_SITE_OTHER): Payer: Medicaid Other

## 2019-08-23 ENCOUNTER — Encounter: Payer: Self-pay | Admitting: Women's Health

## 2019-08-23 ENCOUNTER — Ambulatory Visit: Payer: Medicaid Other | Admitting: *Deleted

## 2019-08-23 ENCOUNTER — Other Ambulatory Visit: Payer: Self-pay

## 2019-08-23 VITALS — BP 119/73 | HR 92 | Wt 178.0 lb

## 2019-08-23 DIAGNOSIS — Z3481 Encounter for supervision of other normal pregnancy, first trimester: Secondary | ICD-10-CM

## 2019-08-23 DIAGNOSIS — O09291 Supervision of pregnancy with other poor reproductive or obstetric history, first trimester: Secondary | ICD-10-CM

## 2019-08-23 DIAGNOSIS — Z3A12 12 weeks gestation of pregnancy: Secondary | ICD-10-CM

## 2019-08-23 DIAGNOSIS — O99341 Other mental disorders complicating pregnancy, first trimester: Secondary | ICD-10-CM | POA: Diagnosis not present

## 2019-08-23 DIAGNOSIS — Z3480 Encounter for supervision of other normal pregnancy, unspecified trimester: Secondary | ICD-10-CM | POA: Diagnosis not present

## 2019-08-23 DIAGNOSIS — Z363 Encounter for antenatal screening for malformations: Secondary | ICD-10-CM

## 2019-08-23 DIAGNOSIS — Z3682 Encounter for antenatal screening for nuchal translucency: Secondary | ICD-10-CM | POA: Diagnosis not present

## 2019-08-23 DIAGNOSIS — Z98891 History of uterine scar from previous surgery: Secondary | ICD-10-CM

## 2019-08-23 DIAGNOSIS — O34219 Maternal care for unspecified type scar from previous cesarean delivery: Secondary | ICD-10-CM | POA: Diagnosis not present

## 2019-08-23 DIAGNOSIS — F418 Other specified anxiety disorders: Secondary | ICD-10-CM

## 2019-08-23 DIAGNOSIS — O0990 Supervision of high risk pregnancy, unspecified, unspecified trimester: Secondary | ICD-10-CM | POA: Insufficient documentation

## 2019-08-23 MED ORDER — VENLAFAXINE HCL ER 37.5 MG PO CP24: 37.5000 mg | ORAL_CAPSULE | Freq: Every day | ORAL | 6 refills | Status: DC

## 2019-08-23 NOTE — Progress Notes (Signed)
[name] 12+2 wks,measurements c/w dates,crl 60.20 mm,NB present,NT 1.5 mm,normal ovaries,fhr 169 bpm,posterior placenta
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
## 2019-08-23 NOTE — Patient Instructions (Signed)
Stacey Cook, I greatly value your feedback.  If you receive a survey following your visit with [name] today, we appreciate you taking the time to fill it out.  Thanks, [name], CNM, WHNP-BC   [facility] [place] [facility] ([place], [place] [address]) Entrance C, located off of [address] parking   Nausea & Vomiting  Have saltine crackers or pretzels by your bed and eat a few bites before you raise your head out of bed in the morning  Eat small frequent meals throughout the day instead of large meals  Drink plenty of fluids throughout the day to stay hydrated, just don't drink a lot of fluids with your meals.  This can make your stomach fill up faster making you feel sick  Do not brush your teeth right after you eat  Products with real ginger are good for nausea, like ginger ale and ginger hard candy Make sure it says made with real ginger!  Sucking on sour candy like lemon heads is also good for nausea  If your prenatal vitamins make you nauseated, take them at night so you will sleep through the nausea  Sea Bands  If you feel like you need medicine for the nausea & vomiting please let us know  If you are unable to keep any fluids or food down please let us know   Constipation  Drink plenty of fluid, preferably water, throughout the day  Eat foods high in fiber such as fruits, vegetables, and grains  Exercise, such as walking, is a good way to keep your bowels regular  Drink warm fluids, especially warm prune juice, or decaf coffee  Eat a 1/2 cup of real oatmeal (not instant), 1/2 cup applesauce, and 1/2-1 cup warm prune juice every day  If needed, you may take Colace (docusate sodium) stool softener once or twice a day to help keep the stool soft.   If you still are having problems with constipation, you may take Miralax once daily as needed to help keep your bowels regular.   Home Blood Pressure Monitoring for Patients    Your provider has recommended that you check your blood pressure (BP) at least once a week at home. If you do not have a blood pressure cuff at home, one will be provided for you. Contact your provider if you have not received your monitor within 1 week.   Helpful Tips for Accurate Home Blood Pressure Checks  . Don't smoke, exercise, or drink caffeine 30 minutes before checking your BP . Use the restroom before checking your BP (a full bladder can raise your pressure) . Relax in a comfortable upright chair . Feet on the ground . Left arm resting comfortably on a flat surface at the level of your heart . Legs uncrossed . Back supported . Sit quietly and don't talk . Place the cuff on your bare arm . Adjust snuggly, so that only two fingertips can fit between your skin and the top of the cuff . Check 2 readings separated by at least one minute . Keep a log of your BP readings . For a visual, please reference this diagram: http://ccnc.care/bpdiagram  Provider Name: [facility]     Phone: [phone]  Zone 1: ALL CLEAR  Continue to monitor your symptoms:  . BP reading is less than 140 (top number) or less than 90 (bottom number)  . No right upper stomach pain . No headaches or  seeing spots . No feeling nauseated or throwing up . No swelling in face and hands  Zone 2: CAUTION Call your doctor's office for any of the following:  . BP reading is greater than 140 (top number) or greater than 90 (bottom number)  . Stomach pain under your ribs in the middle or right side . Headaches or seeing spots . Feeling nauseated or throwing up . Swelling in face and hands  Zone 3: EMERGENCY  Seek immediate medical care if you have any of the following:  . BP reading is greater than160 (top number) or greater than 110 (bottom number) . Severe headaches not improving with Tylenol . Serious difficulty catching your breath . Any worsening symptoms from Zone 2    First Trimester of Pregnancy  The first trimester of pregnancy is from week 1 until the end of week 12 (months 1 through 3). A week after a sperm fertilizes an egg, the egg will implant on the wall of the uterus. This embryo will begin to develop into a baby. Genes from you and your partner are forming the baby. The female genes determine whether the baby is a boy or a girl. At 6-8 weeks, the eyes and face are formed, and the heartbeat can be seen on ultrasound. At the end of 12 weeks, all the baby's organs are formed.  Now that you are pregnant, you will want to do everything you can to have a healthy baby. Two of the most important things are to get good prenatal care and to follow your health care provider's instructions. Prenatal care is all the medical care you receive before the baby's birth. This care will help prevent, find, and treat any problems during the pregnancy and childbirth. BODY CHANGES Your body goes through many changes during pregnancy. The changes vary from woman to woman.   You may gain or lose a couple of pounds at first.  You may feel sick to your stomach (nauseous) and throw up (vomit). If the vomiting is uncontrollable, call your health care provider.  You may tire easily.  You may develop headaches that can be relieved by medicines approved by your health care provider.  You may urinate more often. Painful urination may mean you have a bladder infection.  You may develop heartburn as a result of your pregnancy.  You may develop constipation because certain hormones are causing the muscles that push waste through your intestines to slow down.  You may develop hemorrhoids or swollen, bulging veins (varicose veins).  Your breasts may begin to grow larger and become tender. Your nipples may stick out more, and the tissue that surrounds them (areola) may become darker.  Your gums may bleed and may be sensitive to brushing and flossing.  Dark spots or blotches (chloasma, mask of pregnancy) may  develop on your face. This will likely fade after the baby is born.  Your menstrual periods will stop.  You may have a loss of appetite.  You may develop cravings for certain kinds of food.  You may have changes in your emotions from day to day, such as being excited to be pregnant or being concerned that something may go wrong with the pregnancy and baby.  You may have more vivid and strange dreams.  You may have changes in your hair. These can include thickening of your hair, rapid growth, and changes in texture. Some women also have hair loss during or after pregnancy, or hair that feels dry or thin. Your  hair will most likely return to normal after your baby is born. WHAT TO EXPECT AT YOUR PRENATAL VISITS During a routine prenatal visit:  You will be weighed to make sure you and the baby are growing normally.  Your blood pressure will be taken.  Your abdomen will be measured to track your baby's growth.  The fetal heartbeat will be listened to starting around week 10 or 12 of your pregnancy.  Test results from any previous visits will be discussed. Your health care provider may ask you:  How you are feeling.  If you are feeling the baby move.  If you have had any abnormal symptoms, such as leaking fluid, bleeding, severe headaches, or abdominal cramping.  If you have any questions. Other tests that may be performed during your first trimester include:  Blood tests to find your blood type and to check for the presence of any previous infections. They will also be used to check for low iron levels (anemia) and Rh antibodies. Later in the pregnancy, blood tests for diabetes will be done along with other tests if problems develop.  Urine tests to check for infections, diabetes, or protein in the urine.  An ultrasound to confirm the proper growth and development of the baby.  An amniocentesis to check for possible genetic problems.  Fetal screens for spina bifida and Down  syndrome.  You may need other tests to make sure you and the baby are doing well. HOME CARE INSTRUCTIONS  Medicines  Follow your health care provider's instructions regarding medicine use. Specific medicines may be either safe or unsafe to take during pregnancy.  Take your prenatal vitamins as directed.  If you develop constipation, try taking a stool softener if your health care provider approves. Diet  Eat regular, well-balanced meals. Choose a variety of foods, such as meat or vegetable-based protein, fish, milk and low-fat dairy products, vegetables, fruits, and whole grain breads and cereals. Your health care provider will help you determine the amount of weight gain that is right for you.  Avoid raw meat and uncooked cheese. These carry germs that can cause birth defects in the baby.  Eating four or five small meals rather than three large meals a day may help relieve nausea and vomiting. If you start to feel nauseous, eating a few soda crackers can be helpful. Drinking liquids between meals instead of during meals also seems to help nausea and vomiting.  If you develop constipation, eat more high-fiber foods, such as fresh vegetables or fruit and whole grains. Drink enough fluids to keep your urine clear or pale yellow. Activity and Exercise  Exercise only as directed by your health care provider. Exercising will help you:  Control your weight.  Stay in shape.  Be prepared for labor and delivery.  Experiencing pain or cramping in the lower abdomen or low back is a good sign that you should stop exercising. Check with your health care provider before continuing normal exercises.  Try to avoid standing for long periods of time. Move your legs often if you must stand in one place for a long time.  Avoid heavy lifting.  Wear low-heeled shoes, and practice good posture.  You may continue to have sex unless your health care provider directs you otherwise. Relief of Pain or  Discomfort  Wear a good support bra for breast tenderness.    Take warm sitz baths to soothe any pain or discomfort caused by hemorrhoids. Use hemorrhoid cream if your health care provider  approves.    Rest with your legs elevated if you have leg cramps or low back pain.  If you develop varicose veins in your legs, wear support hose. Elevate your feet for 15 minutes, 3-4 times a day. Limit salt in your diet. Prenatal Care  Schedule your prenatal visits by the twelfth week of pregnancy. They are usually scheduled monthly at first, then more often in the last 2 months before delivery.  Write down your questions. Take them to your prenatal visits.  Keep all your prenatal visits as directed by your health care provider. Safety  Wear your seat belt at all times when driving.  Make a list of emergency phone numbers, including numbers for family, friends, the hospital, and police and fire departments. General Tips  Ask your health care provider for a referral to a local prenatal education class. Begin classes no later than at the beginning of month 6 of your pregnancy.  Ask for help if you have counseling or nutritional needs during pregnancy. Your health care provider can offer advice or refer you to specialists for help with various needs.  Do not use hot tubs, steam rooms, or saunas.  Do not douche or use tampons or scented sanitary pads.  Do not cross your legs for long periods of time.  Avoid cat litter boxes and soil used by cats. These carry germs that can cause birth defects in the baby and possibly loss of the fetus by miscarriage or stillbirth.  Avoid all smoking, herbs, alcohol, and medicines not prescribed by your health care provider. Chemicals in these affect the formation and growth of the baby.  Schedule a dentist appointment. At home, brush your teeth with a soft toothbrush and be gentle when you floss. SEEK MEDICAL CARE IF:   You have dizziness.  You have mild  pelvic cramps, pelvic pressure, or nagging pain in the abdominal area.  You have persistent nausea, vomiting, or diarrhea.  You have a bad smelling vaginal discharge.  You have pain with urination.  You notice increased swelling in your face, hands, legs, or ankles. SEEK IMMEDIATE MEDICAL CARE IF:   You have a fever.  You are leaking fluid from your vagina.  You have spotting or bleeding from your vagina.  You have severe abdominal cramping or pain.  You have rapid weight gain or loss.  You vomit blood or material that looks like coffee grounds.  You are exposed to [name] measles and have never had them.  You are exposed to fifth disease or chickenpox.  You develop a severe headache.  You have shortness of breath.  You have any kind of trauma, such as from a fall or a car accident. Document Released: 04/20/2001 Document Revised: 09/10/2013 Document Reviewed: 03/06/2013 [facility] Patient Information 2015 [place], [place]. This information is not intended to replace advice given to you by your health care provider. Make sure you discuss any questions you have with your health care provider.

## 2019-08-24 LAB — CYTOLOGY - PAP
Chlamydia: NEGATIVE
Comment: NEGATIVE
Comment: NORMAL
Diagnosis: NEGATIVE
Neisseria Gonorrhea: NEGATIVE

## 2019-08-25 LAB — PMP SCREEN PROFILE (10S), URINE
Amphetamine Scrn, Ur: NEGATIVE ng/mL
BARBITURATE SCREEN URINE: NEGATIVE ng/mL
BENZODIAZEPINE SCREEN, URINE: NEGATIVE ng/mL
CANNABINOIDS UR QL SCN: NEGATIVE ng/mL
Cocaine (Metab) Scrn, Ur: NEGATIVE ng/mL
Creatinine(Crt), U: 144.9 mg/dL (ref 20.0–300.0)
Methadone Screen, Urine: NEGATIVE ng/mL
OXYCODONE+OXYMORPHONE UR QL SCN: NEGATIVE ng/mL
Opiate Scrn, Ur: NEGATIVE ng/mL
Ph of Urine: 6.7 (ref 4.5–8.9)
Phencyclidine Qn, Ur: NEGATIVE ng/mL
Propoxyphene Scrn, Ur: NEGATIVE ng/mL

## 2019-08-25 LAB — INTEGRATED 1
Crown Rump Length: 60.2 mm
Gest. Age on Collection Date: 12.3 weeks
Maternal Age at EDD: 22 yr
Nuchal Translucency (NT): 1.5 mm
Number of Fetuses: 1
PAPP-A Value: 409.2 ng/mL
Weight: 178 [lb_av]

## 2019-08-25 LAB — OBSTETRIC PANEL, INCLUDING HIV
Antibody Screen: NEGATIVE
Basophils Absolute: 0 10*3/uL (ref 0.0–0.2)
Basos: 0 %
EOS (ABSOLUTE): 0.2 10*3/uL (ref 0.0–0.4)
Eos: 3 %
HIV Screen 4th Generation wRfx: NONREACTIVE
Hematocrit: 37.2 % (ref 34.0–46.6)
Hemoglobin: 12.3 g/dL (ref 11.1–15.9)
Hepatitis B Surface Ag: NEGATIVE
Immature Grans (Abs): 0 10*3/uL (ref 0.0–0.1)
Immature Granulocytes: 0 %
Lymphocytes Absolute: 1.9 10*3/uL (ref 0.7–3.1)
Lymphs: 27 %
MCH: 27.6 pg (ref 26.6–33.0)
MCHC: 33.1 g/dL (ref 31.5–35.7)
MCV: 83 fL (ref 79–97)
Monocytes Absolute: 0.7 10*3/uL (ref 0.1–0.9)
Monocytes: 10 %
Neutrophils Absolute: 4.2 10*3/uL (ref 1.4–7.0)
Neutrophils: 60 %
Platelets: 189 10*3/uL (ref 150–450)
RBC: 4.46 x10E6/uL (ref 3.77–5.28)
RDW: 15 % (ref 11.7–15.4)
RPR Ser Ql: NONREACTIVE
Rh Factor: POSITIVE
Rubella Antibodies, IGG: 1.01 index (ref >0.99)
WBC: 7 10*3/uL (ref 3.4–10.8)

## 2019-08-25 LAB — HEMOGLOBIN A1C
Est. average glucose Bld gHb Est-mCnc: 97 mg/dL
Hgb A1c MFr Bld: 5 % (ref 4.8–5.6)

## 2019-08-25 LAB — HEPATITIS C ANTIBODY: Hep C Virus Ab: <0.1 s/co ratio (ref 0.0–0.9)

## 2019-08-25 LAB — URINE CULTURE

## 2019-09-13 ENCOUNTER — Other Ambulatory Visit: Payer: Self-pay

## 2019-09-13 ENCOUNTER — Telehealth: Payer: Medicaid Other | Admitting: Advanced Practice Midwife

## 2019-09-19 ENCOUNTER — Ambulatory Visit (INDEPENDENT_AMBULATORY_CARE_PROVIDER_SITE_OTHER): Payer: Medicaid Other | Admitting: Advanced Practice Midwife

## 2019-09-19 ENCOUNTER — Other Ambulatory Visit: Payer: Self-pay

## 2019-09-19 VITALS — BP 109/71 | HR 98 | Wt 177.5 lb

## 2019-09-19 DIAGNOSIS — O99342 Other mental disorders complicating pregnancy, second trimester: Secondary | ICD-10-CM

## 2019-09-19 DIAGNOSIS — F418 Other specified anxiety disorders: Secondary | ICD-10-CM

## 2019-09-19 DIAGNOSIS — Z3A16 16 weeks gestation of pregnancy: Secondary | ICD-10-CM

## 2019-09-19 DIAGNOSIS — Z3480 Encounter for supervision of other normal pregnancy, unspecified trimester: Secondary | ICD-10-CM

## 2019-09-19 DIAGNOSIS — Z1379 Encounter for other screening for genetic and chromosomal anomalies: Secondary | ICD-10-CM

## 2019-09-19 DIAGNOSIS — Z331 Pregnant state, incidental: Secondary | ICD-10-CM

## 2019-09-19 DIAGNOSIS — O09292 Supervision of pregnancy with other poor reproductive or obstetric history, second trimester: Secondary | ICD-10-CM

## 2019-09-19 LAB — POCT URINALYSIS DIPSTICK OB
Blood, UA: NEGATIVE
Glucose, UA: NEGATIVE
Leukocytes, UA: NEGATIVE
Nitrite, UA: NEGATIVE
POC,PROTEIN,UA: NEGATIVE

## 2019-09-19 MED ORDER — VENLAFAXINE HCL ER 37.5 MG PO CP24: 37.5000 mg | ORAL_CAPSULE | Freq: Every day | ORAL | 6 refills | Status: DC

## 2019-09-19 MED ORDER — BUTALBITAL-APAP-CAFFEINE 50-325-40 MG PO TABS: 1.0000 | ORAL_TABLET | Freq: Four times a day (QID) | ORAL | 0 refills | Status: DC | PRN

## 2019-09-19 NOTE — Progress Notes (Signed)
LOW-RISK PREGNANCY VISIT Patient name: Stacey Cook MRN [id number]  Date of birth: Jun 27, 1995 Chief Complaint:   Routine Prenatal Visit  History of Present Illness:   Stacey Cook is a 24 y.o. [id number] female at [redacted]w[redacted]d with an Estimated Date of Delivery: 03/04/20 being seen today for ongoing management of a low-risk pregnancy.  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 Contractions: Not present. Vag. Bleeding: None.  Movement: Absent. denies leaking of fluid. Review of Systems:   Pertinent items are noted in HPI Denies abnormal vaginal discharge w/ itching/odor/irritation, headaches, visual changes, shortness of breath, chest pain, abdominal pain, severe nausea/vomiting, or problems with urination or bowel movements unless otherwise stated above. Pertinent History Reviewed:  Reviewed past medical,surgical, social, obstetrical and family history.  Reviewed problem list, medications and allergies. Physical Assessment:   Vitals:   09/19/19 1529  BP: 109/71  Pulse: 98  Weight: 177 lb 8 oz (80.5 kg)  Body mass index is 35.85 kg/m.        Physical Examination:   General appearance: Well appearing, and in no distress  Mental status: Alert, oriented to person, place, and time  Skin: Warm & dry  Cardiovascular: Normal heart rate noted  Respiratory: Normal respiratory effort, no distress  Abdomen: Soft, gravid, nontender  Pelvic: Cervical exam deferred         Extremities: Edema: None  Fetal Status: Fetal Heart Rate (bpm): 156   Movement: Absent    Results for orders placed or performed in visit on 09/19/19 (from the past 24 hour(s))  POC Urinalysis Dipstick OB   Collection Time: 09/19/19  3:37 PM  Result Value Ref  Range   Color, UA     Clarity, UA     Glucose, UA Negative Negative   Bilirubin, UA     Ketones, UA small    Spec Grav, UA     Blood, UA neg    pH, UA     POC,PROTEIN,UA Negative Negative, Trace, Small (1+), Moderate (2+), Large (3+), 4+   Urobilinogen, UA     Nitrite, UA neg    Leukocytes, UA Negative Negative   Appearance     Odor      Assessment & Plan:  1) Low-risk pregnancy [id number] at [redacted]w[redacted]d with an Estimated Date of Delivery: 03/04/20   2) Frequent headaches, will try Fioricet  3) Depression/anxiety, didn't start Effexor yet due to needing copay money and then pharmacy put meds back, resent   4) Hx GDMA2, nl HgbA1c; GTT at 26wks  5) Previous C/S elective due to SD with PPH in 1st delivery, wants to leave option open until 3rd trimester for rLTCS vs TOLAC  6) Recent syncopal episode after receiving injection near Cochlear implant> had head CT after concern for subarachnoid hemorrhage, but exam was neg   Meds:  Meds ordered this encounter  Medications  . venlafaxine XR (EFFEXOR XR) 37.5 MG 24 hr capsule    Sig: Take 1 capsule (37.5 mg total) by mouth daily with breakfast.    Dispense:  30 capsule    Refill:  6    Order Specific Question:   Supervising Provider    Answer:   Jonnie Kind [[id number]]  .  butalbital-acetaminophen-caffeine (FIORICET) 50-325-40 MG tablet    Sig: Take 1-2 tablets by mouth every 6 (six) hours as needed for headache.    Dispense:  20 tablet    Refill:  0    Order Specific Question:   Supervising Provider    Answer:   Marjory Lies   Labs/procedures today: 2nd IT  Plan:  Continue routine obstetrical care   Reviewed: Preterm labor symptoms and general obstetric precautions including but not limited to vaginal bleeding, contractions, leaking of fluid and fetal movement were reviewed in detail with the patient.  All questions were answered. Has home bp cuff. Check bp weekly, let us know if >140/90.   Follow-up: Return for Keep u/s and  visit on 5/27 as scheduled.  Orders Placed This Encounter  Procedures  . INTEGRATED 2  . POC Urinalysis Dipstick OB   Myrtis Ser [facility] 09/19/2019 3:58 PM

## 2019-09-19 NOTE — Patient Instructions (Signed)
Stacey Cook, I greatly value your feedback.  If you receive a survey following your visit with [name] today, we appreciate you taking the time to fill it out.  Thanks, Derrill Memo, [place] [facility] ([place], [place] [address]) Entrance C, located off of [place] parking  Go to [email or web address] to register for FREE online childbirth classes  [place] Pediatricians/Family Doctors:  [facility] [place] [phone]                 [place] [phone] (usually not accepting new patients unless you have family there already, you are always welcome to call and ask)       [facility] Department [phone]       [facility] Pediatricians/Family Doctors:   [facility]: [phone]  [facility]: [phone]  [facility]: [place] Doctors:   [facility]: [facility]: [place]:  [place]: [phone]    Home Blood Pressure Monitoring for Patients   Your provider has recommended that you check your blood pressure (BP) at least once a week at home. If you do not have a blood pressure cuff at home, one will be provided for you. Contact your provider if you have not received your monitor within 1 week.   Helpful Tips for Accurate Home Blood Pressure Checks  . Don't smoke, exercise, or drink caffeine 30 minutes before checking your BP . Use the restroom before checking your BP (a full bladder can raise your pressure) . Relax in a comfortable upright chair . Feet on the ground . Left arm resting comfortably on a flat surface at the level of your heart . Legs uncrossed . Back supported . Sit quietly and don't talk . Place the cuff on your bare arm . Adjust snuggly, so that only two  fingertips can fit between your skin and the top of the cuff . Check 2 readings separated by at least one minute . Keep a log of your BP readings . For a visual, please reference this diagram: http://ccnc.care/bpdiagram  Provider Name: [facility]     Phone: [phone]  Zone 1: ALL CLEAR  Continue to monitor your symptoms:  . BP reading is less than 140 (top number) or less than 90 (bottom number)  . No right upper stomach pain . No headaches or seeing spots . No feeling nauseated or throwing up . No swelling in face and hands  Zone 2: CAUTION Call your doctor's office for any of the following:  . BP reading is greater than 140 (top number) or greater than 90 (bottom number)  . Stomach pain under your ribs in the middle or right side . Headaches or seeing spots . Feeling nauseated or throwing up . Swelling in face and hands  Zone 3: EMERGENCY  Seek immediate medical care if you have any of the following:  . BP reading is greater than160 (top number) or greater than 110 (bottom number) . Severe headaches not improving with Tylenol . Serious difficulty catching your breath . Any worsening symptoms from Zone 2     Second Trimester of Pregnancy The second trimester is from week 14 through week 27 (months 4 through 6). The second trimester is often a time when you feel your best.  Your body has adjusted to being pregnant, and you begin to feel better physically. Usually, morning sickness has lessened or quit completely, you may have more energy, and you may have an increase in appetite. The second trimester is also a time when the fetus is growing rapidly. At the end of the sixth month, the fetus is about 9 inches long and weighs about 1 pounds. You will likely begin to feel the baby move (quickening) between 16 and 20 weeks of pregnancy. Body changes during your second trimester Your body continues to go through many changes during your second trimester. The changes vary from  woman to woman.  Your weight will continue to increase. You will notice your lower abdomen bulging out.  You may begin to get stretch marks on your hips, abdomen, and breasts.  You may develop headaches that can be relieved by medicines. The medicines should be approved by your health care provider.  You may urinate more often because the fetus is pressing on your bladder.  You may develop or continue to have heartburn as a result of your pregnancy.  You may develop constipation because certain hormones are causing the muscles that push waste through your intestines to slow down.  You may develop hemorrhoids or swollen, bulging veins (varicose veins).  You may have back pain. This is caused by: ? Weight gain. ? Pregnancy hormones that are relaxing the joints in your pelvis. ? A shift in weight and the muscles that support your balance.  Your breasts will continue to grow and they will continue to become tender.  Your gums may bleed and may be sensitive to brushing and flossing.  Dark spots or blotches (chloasma, mask of pregnancy) may develop on your face. This will likely fade after the baby is born.  A dark line from your belly button to the pubic area (linea nigra) may appear. This will likely fade after the baby is born.  You may have changes in your hair. These can include thickening of your hair, rapid growth, and changes in texture. Some women also have hair loss during or after pregnancy, or hair that feels dry or thin. Your hair will most likely return to normal after your baby is born.  What to expect at prenatal visits During a routine prenatal visit:  You will be weighed to make sure you and the fetus are growing normally.  Your blood pressure will be taken.  Your abdomen will be measured to track your baby's growth.  The fetal heartbeat will be listened to.  Any test results from the previous visit will be discussed.  Your health care provider may ask  you:  How you are feeling.  If you are feeling the baby move.  If you have had any abnormal symptoms, such as leaking fluid, bleeding, severe headaches, or abdominal cramping.  If you are using any tobacco products, including cigarettes, chewing tobacco, and electronic cigarettes.  If you have any questions.  Other tests that may be performed during your second trimester include:  Blood tests that check for: ? Low iron levels (anemia). ? High blood sugar that affects pregnant women (gestational diabetes) between 38 and 28 weeks. ? Rh antibodies. This is to check for a protein on red blood cells (Rh factor).  Urine tests to check for infections, diabetes, or protein in the urine.  An ultrasound to confirm the proper growth and development of the baby.  An amniocentesis to check for possible genetic problems.  Fetal screens  for spina bifida and Down syndrome.  HIV (human immunodeficiency virus) testing. Routine prenatal testing includes screening for HIV, unless you choose not to have this test.  Follow these instructions at home: Medicines  Follow your health care provider's instructions regarding medicine use. Specific medicines may be either safe or unsafe to take during pregnancy.  Take a prenatal vitamin that contains at least 600 micrograms (mcg) of folic acid.  If you develop constipation, try taking a stool softener if your health care provider approves. Eating and drinking  Eat a balanced diet that includes fresh fruits and vegetables, whole grains, good sources of protein such as meat, eggs, or tofu, and low-fat dairy. Your health care provider will help you determine the amount of weight gain that is right for you.  Avoid raw meat and uncooked cheese. These carry germs that can cause birth defects in the baby.  If you have low calcium intake from food, talk to your health care provider about whether you should take a daily calcium supplement.  Limit foods that  are high in fat and processed sugars, such as fried and sweet foods.  To prevent constipation: ? Drink enough fluid to keep your urine clear or pale yellow. ? Eat foods that are high in fiber, such as fresh fruits and vegetables, whole grains, and beans. Activity  Exercise only as directed by your health care provider. Most women can continue their usual exercise routine during pregnancy. Try to exercise for 30 minutes at least 5 days a week. Stop exercising if you experience uterine contractions.  Avoid heavy lifting, wear low heel shoes, and practice good posture.  A sexual relationship may be continued unless your health care provider directs you otherwise. Relieving pain and discomfort  Wear a good support bra to prevent discomfort from breast tenderness.  Take warm sitz baths to soothe any pain or discomfort caused by hemorrhoids. Use hemorrhoid cream if your health care provider approves.  Rest with your legs elevated if you have leg cramps or low back pain.  If you develop varicose veins, wear support hose. Elevate your feet for 15 minutes, 3-4 times a day. Limit salt in your diet. Prenatal Care  Write down your questions. Take them to your prenatal visits.  Keep all your prenatal visits as told by your health care provider. This is important. Safety  Wear your seat belt at all times when driving.  Make a list of emergency phone numbers, including numbers for family, friends, the hospital, and police and fire departments. General instructions  Ask your health care provider for a referral to a local prenatal education class. Begin classes no later than the beginning of month 6 of your pregnancy.  Ask for help if you have counseling or nutritional needs during pregnancy. Your health care provider can offer advice or refer you to specialists for help with various needs.  Do not use hot tubs, steam rooms, or saunas.  Do not douche or use tampons or scented sanitary  pads.  Do not cross your legs for long periods of time.  Avoid cat litter boxes and soil used by cats. These carry germs that can cause birth defects in the baby and possibly loss of the fetus by miscarriage or stillbirth.  Avoid all smoking, herbs, alcohol, and unprescribed drugs. Chemicals in these products can affect the formation and growth of the baby.  Do not use any products that contain nicotine or tobacco, such as cigarettes and e-cigarettes. If you need help quitting,  ask your health care provider.  Visit your dentist if you have not gone yet during your pregnancy. Use a soft toothbrush to brush your teeth and be gentle when you floss. Contact a health care provider if:  You have dizziness.  You have mild pelvic cramps, pelvic pressure, or nagging pain in the abdominal area.  You have persistent nausea, vomiting, or diarrhea.  You have a bad smelling vaginal discharge.  You have pain when you urinate. Get help right away if:  You have a fever.  You are leaking fluid from your vagina.  You have spotting or bleeding from your vagina.  You have severe abdominal cramping or pain.  You have rapid weight gain or weight loss.  You have shortness of breath with chest pain.  You notice sudden or extreme swelling of your face, hands, ankles, feet, or legs.  You have not felt your baby move in over an hour.  You have severe headaches that do not go away when you take medicine.  You have vision changes. Summary  The second trimester is from week 14 through week 27 (months 4 through 6). It is also a time when the fetus is growing rapidly.  Your body goes through many changes during pregnancy. The changes vary from woman to woman.  Avoid all smoking, herbs, alcohol, and unprescribed drugs. These chemicals affect the formation and growth your baby.  Do not use any tobacco products, such as cigarettes, chewing tobacco, and e-cigarettes. If you need help quitting, ask your  health care provider.  Contact your health care provider if you have any questions. Keep all prenatal visits as told by your health care provider. This is important. This information is not intended to replace advice given to you by your health care provider. Make sure you discuss any questions you have with your health care provider. Document Released: 04/20/2001 Document Revised: 10/02/2015 Document Reviewed: 06/27/2012 Elsevier Interactive Patient Education  2017 [place] FLU! Because you are pregnant, we at [facility], along with the Centers for Disease Control (CDC), recommend that you receive the flu vaccine to protect yourself and your baby from the flu. The flu is more likely to cause severe illness in pregnant women than in women of reproductive age who are not pregnant. Changes in the immune system, heart, and lungs during pregnancy make pregnant women (and women up to two weeks postpartum) more prone to severe illness from flu, including illness resulting in hospitalization. Flu also may be harmful for a pregnant woman's developing baby. A common flu symptom is fever, which may be associated with neural tube defects and other adverse outcomes for a developing baby. Getting vaccinated can also help protect a baby after birth from flu. (Mom passes antibodies onto the developing baby during her pregnancy.)  A Flu Vaccine is the Best Protection Against Flu Getting a flu vaccine is the first and most important step in protecting against flu. Pregnant women should get a flu shot and not the live attenuated influenza vaccine (LAIV), also known as nasal spray flu vaccine. Flu vaccines given during pregnancy help protect both the mother and her baby from flu. Vaccination has been shown to reduce the risk of flu-associated acute respiratory infection in pregnant women by up to one-half. A 2018 study showed that getting a flu shot reduced a pregnant woman's  risk of being hospitalized with flu by an average of 40 percent. Pregnant women who get a flu  vaccine are also helping to protect their babies from flu illness for the first several months after their birth, when they are too young to get vaccinated.   A Long Record of Safety for Flu Shots in Pregnant Women Flu shots have been given to millions of pregnant women over many years with a good safety record. There is a lot of evidence that flu vaccines can be given safely during pregnancy; though these data are limited for the first trimester. The CDC recommends that pregnant women get vaccinated during any trimester of their pregnancy. It is very important for pregnant women to get the flu shot.   Other Preventive Actions In addition to getting a flu shot, pregnant women should take the same everyday preventive actions the CDC recommends of everyone, including covering coughs, washing hands often, and avoiding people who are sick.  Symptoms and Treatment If you get sick with flu symptoms call your doctor right away. There are antiviral drugs that can treat flu illness and prevent serious flu complications. The CDC recommends prompt treatment for people who have influenza infection or suspected influenza infection and who are at high risk of serious flu complications, such as people with asthma, diabetes (including gestational diabetes), or heart disease. Early treatment of influenza in hospitalized pregnant women has been shown to reduce the length of the hospital stay.  Symptoms Flu symptoms include fever, cough, sore throat, runny or stuffy nose, body aches, headache, chills and fatigue. Some people may also have vomiting and diarrhea. People may be infected with the flu and have respiratory symptoms without a fever.  Early Treatment is Important for Pregnant Women Treatment should begin as soon as possible because antiviral drugs work best when started early (within 48 hours after symptoms  start). Antiviral drugs can make your flu illness milder and make you feel better faster. They may also prevent serious health problems that can result from flu illness. Oral oseltamivir (Tamiflu) is the preferred treatment for pregnant women because it has the most studies available to suggest that it is safe and beneficial. Antiviral drugs require a prescription from your provider. Having a fever caused by flu infection or other infections early in pregnancy may be linked to birth defects in a baby. In addition to taking antiviral drugs, pregnant women who get a fever should treat their fever with Tylenol (acetaminophen) and contact their provider immediately.  When to [place] If you are pregnant and have any of these signs, seek care immediately:  Difficulty breathing or shortness of breath  Pain or pressure in the chest or abdomen  Sudden dizziness  Confusion  Severe or persistent vomiting  High fever that is not responding to Tylenol (or store brand equivalent)  Decreased or no movement of your baby  [email or web address]

## 2019-09-21 ENCOUNTER — Encounter (HOSPITAL_COMMUNITY): Payer: Self-pay | Admitting: *Deleted

## 2019-09-21 ENCOUNTER — Other Ambulatory Visit: Payer: Self-pay

## 2019-09-21 ENCOUNTER — Emergency Department (HOSPITAL_COMMUNITY)
Admission: EM | Admit: 2019-09-21 | Discharge: 2019-09-21 | Disposition: A | Discharge status: Home / Self Care | Payer: Medicaid Other | Attending: Emergency Medicine | Admitting: Emergency Medicine

## 2019-09-21 DIAGNOSIS — R112 Nausea with vomiting, unspecified: Secondary | ICD-10-CM

## 2019-09-21 DIAGNOSIS — F17210 Nicotine dependence, cigarettes, uncomplicated: Secondary | ICD-10-CM | POA: Insufficient documentation

## 2019-09-21 DIAGNOSIS — J45909 Unspecified asthma, uncomplicated: Secondary | ICD-10-CM | POA: Diagnosis not present

## 2019-09-21 DIAGNOSIS — O26899 Other specified pregnancy related conditions, unspecified trimester: Secondary | ICD-10-CM

## 2019-09-21 DIAGNOSIS — O219 Vomiting of pregnancy, unspecified: Secondary | ICD-10-CM | POA: Insufficient documentation

## 2019-09-21 DIAGNOSIS — Z79899 Other long term (current) drug therapy: Secondary | ICD-10-CM | POA: Insufficient documentation

## 2019-09-21 DIAGNOSIS — F909 Attention-deficit hyperactivity disorder, unspecified type: Secondary | ICD-10-CM | POA: Insufficient documentation

## 2019-09-21 DIAGNOSIS — Z3A16 16 weeks gestation of pregnancy: Secondary | ICD-10-CM | POA: Insufficient documentation

## 2019-09-21 DIAGNOSIS — O99891 Other specified diseases and conditions complicating pregnancy: Secondary | ICD-10-CM

## 2019-09-21 LAB — INTEGRATED 2
AFP MoM: 0.8
Alpha-Fetoprotein: 23.5 ng/mL
Crown Rump Length: 60.2 mm
DIA MoM: 0.79
DIA Value: 111.9 pg/mL
Estriol, Unconjugated: 0.59 ng/mL
Gest. Age on Collection Date: 12.3 weeks
Gestational Age: 16.1 weeks
Maternal Age at EDD: 22 yr
Nuchal Translucency (NT): 1.5 mm
Nuchal Translucency MoM: 1.12
Number of Fetuses: 1
PAPP-A MoM: 0.54
PAPP-A Value: 409.2 ng/mL
Test Results:: NEGATIVE
Weight: 178 [lb_av]
Weight: 178 [lb_av]
hCG MoM: 0.54
hCG Value: 16.7 IU/mL
uE3 MoM: 0.65

## 2019-09-21 LAB — URINALYSIS, ROUTINE W REFLEX MICROSCOPIC
Bilirubin Urine: NEGATIVE
Glucose, UA: NEGATIVE mg/dL
Hgb urine dipstick: NEGATIVE
Ketones, ur: 5 mg/dL — AB
Nitrite: NEGATIVE
Protein, ur: NEGATIVE mg/dL
Specific Gravity, Urine: 1.009 (ref 1.005–1.030)
pH: 6 (ref 5.0–8.0)

## 2019-09-21 LAB — CBC WITH DIFFERENTIAL/PLATELET
Abs Immature Granulocytes: 0.04 10*3/uL (ref 0.00–0.07)
Basophils Absolute: 0 10*3/uL (ref 0.0–0.1)
Basophils Relative: 0 %
Eosinophils Absolute: 0.2 10*3/uL (ref 0.0–0.5)
Eosinophils Relative: 2 %
HCT: 36.5 % (ref 36.0–46.0)
Hemoglobin: 12.3 g/dL (ref 12.0–15.0)
Immature Granulocytes: 0 %
Lymphocytes Relative: 10 %
Lymphs Abs: 1.1 10*3/uL (ref 0.7–4.0)
MCH: 29 pg (ref 26.0–34.0)
MCHC: 33.7 g/dL (ref 30.0–36.0)
MCV: 86.1 fL (ref 80.0–100.0)
Monocytes Absolute: 0.6 10*3/uL (ref 0.1–1.0)
Monocytes Relative: 6 %
Neutro Abs: 8.3 10*3/uL — ABNORMAL HIGH (ref 1.7–7.7)
Neutrophils Relative %: 82 %
Platelets: 156 10*3/uL (ref 150–400)
RBC: 4.24 MIL/uL (ref 3.87–5.11)
RDW: 14.6 % (ref 11.5–15.5)
WBC: 10.1 10*3/uL (ref 4.0–10.5)
nRBC: 0 % (ref 0.0–0.2)

## 2019-09-21 LAB — COMPREHENSIVE METABOLIC PANEL
ALT: 17 U/L (ref 0–44)
AST: 16 U/L (ref 15–41)
Albumin: 3.3 g/dL — ABNORMAL LOW (ref 3.5–5.0)
Alkaline Phosphatase: 98 U/L (ref 38–126)
Anion gap: 6 (ref 5–15)
BUN: 9 mg/dL (ref 6–20)
CO2: 22 mmol/L (ref 22–32)
Calcium: 8.6 mg/dL — ABNORMAL LOW (ref 8.9–10.3)
Chloride: 106 mmol/L (ref 98–111)
Creatinine, Ser: 0.34 mg/dL — ABNORMAL LOW (ref 0.44–1.00)
GFR calc Af Amer: >60 mL/min (ref >60)
GFR calc non Af Amer: >60 mL/min (ref >60)
Glucose, Bld: 92 mg/dL (ref 70–99)
Potassium: 3.9 mmol/L (ref 3.5–5.1)
Sodium: 134 mmol/L — ABNORMAL LOW (ref 135–145)
Total Bilirubin: 0.6 mg/dL (ref 0.3–1.2)
Total Protein: 6.8 g/dL (ref 6.5–8.1)

## 2019-09-21 LAB — HCG, QUANTITATIVE, PREGNANCY: hCG, Beta Chain, Quant, S: 16333 m[IU]/mL — ABNORMAL HIGH (ref <5)

## 2019-09-21 LAB — LIPASE, BLOOD: Lipase: 28 U/L (ref 11–51)

## 2019-09-21 MED ORDER — METOCLOPRAMIDE HCL 10 MG PO TABS: 10.0000 mg | ORAL_TABLET | Freq: Four times a day (QID) | ORAL | 0 refills | Status: DC | PRN

## 2019-09-21 MED ORDER — PROMETHAZINE HCL 25 MG/ML IJ SOLN
25.0000 mg | Freq: Once | INTRAMUSCULAR | Status: AC
  Administered 2019-09-21: 25 mg via INTRAVENOUS
  Filled 2019-09-21: qty 1

## 2019-09-21 MED ORDER — NITROFURANTOIN MONOHYD MACRO 100 MG PO CAPS: 100.0000 mg | ORAL_CAPSULE | Freq: Two times a day (BID) | ORAL | 0 refills | Status: AC

## 2019-09-21 MED ORDER — DIPHENHYDRAMINE HCL 50 MG/ML IJ SOLN
25.0000 mg | Freq: Once | INTRAMUSCULAR | Status: AC
  Administered 2019-09-21: 25 mg via INTRAVENOUS
  Filled 2019-09-21: qty 1

## 2019-09-21 MED ORDER — LACTATED RINGERS IV BOLUS
1000.0000 mL | Freq: Once | INTRAVENOUS | Status: AC
  Administered 2019-09-21: 1000 mL via INTRAVENOUS

## 2019-09-21 NOTE — ED Triage Notes (Signed)
States she is [redacted] weeks pregnant and having pains in her abdomen with vomiting. Took tylenol without relief

## 2019-09-21 NOTE — ED Provider Notes (Signed)
[facility] Provider Note   CSN: [id number] Arrival date & time: 09/21/19  1752     History Chief Complaint  Patient presents with  . Emesis During Pregnancy    [facility] is a 24 y.o. female.  [facility] is a 24 y.o. female with a history of headaches, anxiety, gestational diabetes and cholestasis during previous pregnancies, currently [redacted] weeks pregnant, presenting with vomiting and abdominal pain.  She states that she has been feeling poorly for the past 2 days with abdominal pain, today she started vomiting and has not been able to keep anything down.  Reports she is also been having diarrhea over the past few days.  Reports her son was recently sick with a GI bug as well as a few other members of her family.  She reports that today she checked her temperature and had a fever, took Tylenol which did not help her pain but did seem to help her fever.  She denies any associated cough, rhinorrhea, chest pain or shortness of breath.  She does report that today she has had some left flank pain and decreased urination, she denies dysuria or blood in her urine.  She has had typical physiologic clear vaginal discharge, denies any vaginal bleeding.  Reports she has had generalized abdominal pain described as cramping that intermittently gets worse and then seems to improve.  She has not felt much fetal movement over the past 2 days while she has been feeling poorly.        Past Medical History:  Diagnosis Date  . Abdominal pain   . Anemia   . Anginal pain ([place])   . Anxiety   . Arthritis    knees  . Asthma   . Cervicalgia   . Cholestasis during pregnancy   . Chronic abdominal pain   . Complication of anesthesia    light headed  . Constipation   . Ear mass   . Gestational diabetes    pt states not been checking sugars regularly at home; nor has she been taking her Metformin  . Gestational diabetes   . Headache   . Hearing loss in right ear    has cochlear  hearing aid  . HSV infection   . Low iron   . Mental disorder   . PONV (postoperative nausea and vomiting)   . Suicidal intent   . UTI (lower urinary tract infection) 05/2014  . Vomiting     Patient Active Problem List   Diagnosis Date Noted  . Encounter for supervision of other normal pregnancy, unspecified trimester 08/23/2019  . Cellulitis of scalp 04/02/2019  . Depression, postpartum 02/09/2019  . Epidermal cyst 02/09/2019  . Status post cesarean section 01/03/2019  . 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 Gastroesophageal reflux disease   . Acute gallstone pancreatitis 01/12/2018  . Pancreatitis 01/12/2018  . History of shoulder dystocia in prior pregnancy 12/02/2017  . History of postpartum hemorrhage 12/02/2017  . History of cholestasis during pregnancy 09/21/2017  . History of gestational diabetes 08/24/2017  . Chest pain due to GERD 08/05/2017  . Depression with anxiety and suicidal ideations 06/13/2017  . History of miscarriage 01/18/2017  . Conductive hearing loss, unilat, unrestrict hearing contralateral side 12/10/2015  . 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 Asthma, mild intermittent 02/18/2015  . 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 Perforation of left tympanic membrane 06/19/2014  . Cervicalgia 12/20/2013  . ADHD (attention deficit hyperactivity disorder), combined type 08/01/2012  . ODD (oppositional defiant disorder) 08/01/2012  . Vasovagal syncope 02/28/2012  . Cholesteatoma of attic of right ear 02/15/2011    Past Surgical History:  Procedure Laterality Date  . CESAREAN SECTION N/A 01/03/2019   Procedure: CESAREAN SECTION;  Surgeon: [name], MD;  Location: [facility];  Service: Obstetrics;  Laterality: N/A;  . CHOLECYSTECTOMY N/A 01/16/2018    Procedure: LAPAROSCOPIC CHOLECYSTECTOMY;  Surgeon: Aviva Signs, MD;  Location: [facility];  Service: General;  Laterality: N/A;  per Dr. Arnoldo Morale, pt will most likely go home and he will tell her to arrive at 10:45 - already has labs  . CHOLESTEATOMA EXCISION    . COCHLEAR IMPLANT Right   . DILATION AND EVACUATION N/A 09/11/2016   Procedure: DILATATION AND CURRATAGE  2ND TRIMESTER;  Surgeon: Jonnie Kind, MD;  Location: [facility];  Service: Gynecology;  Laterality: N/A;  . IMPLANTATION BONE ANCHORED HEARING AID Right 04/2013  . INNER EAR SURGERY Right 03/31/2018  . 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 TYMPANOPLASTY Left   . TYMPANOSTOMY       OB History    Gravida  5   Para  2   Term  2   Preterm  0   AB  2   Living  2     SAB  2   TAB  0   Ectopic  0   Multiple  0   Live Births  2           Family History  Problem Relation Age of Onset  . Cholelithiasis Mother   . Kidney disease Mother        stones  . Depression Mother   . Hypertension Maternal Grandmother   . Diabetes Maternal Grandmother   . Stroke Maternal Grandmother   . Seizures Maternal Grandmother   . Asthma Maternal Grandmother   . Hyperlipidemia Maternal Grandmother   . Thyroid disease Maternal Grandmother   . Asthma Brother   . Seizures Maternal Uncle   . Cancer Other        breast- great aunt  . Celiac disease Neg Hx     Social History   Tobacco Use  . Smoking status: Current Every Day Smoker    Packs/day: 0.25    Years: 1.00    Pack years: 0.25    Types: Cigarettes    Last attempt to quit: 03/11/2017    Years since quitting: 2.5  . Smokeless tobacco: Never Used  Substance Use Topics  . Alcohol use: No    Alcohol/week: 0.0 standard drinks  . Drug use: No    Home Medications Prior to Admission medications   Medication Sig Start Date End Date Taking? Authorizing Provider  acetaminophen (TYLENOL) 500 MG tablet Take 500 mg by mouth as needed.    [provider]  butalbital-acetaminophen-caffeine (FIORICET) 6200299586 MG tablet Take 1-2 tablets by mouth every 6 (six) hours as needed for headache. 09/19/19 09/18/20  Myrtis Ser, CNM  cyclobenzaprine (FLEXERIL) 10 MG tablet Take 1 tablet (10 mg total) by mouth 3 (three) times daily as needed (back pain). May use 1/2 tablet for mild back pain. Patient not taking: Reported on 09/19/2019 08/14/19   Julianne Handler, CNM  metoCLOPramide (REGLAN) 10 MG tablet Take 1  tablet (10 mg total) by mouth every 6 (six) hours as needed for nausea. 09/21/19   Jacqlyn Larsen, PA-C  pantoprazole (PROTONIX) 20 MG tablet Take 1 tablet (20 mg total) by mouth 2 (two) times daily. Patient not taking: Reported on 09/19/2019 04/23/19   Triplett, Lynelle Smoke, PA-C  Prenatal Vit-Fe Fumarate-FA (MULTIVITAMIN-PRENATAL) 27-0.8 MG TABS tablet Take 1 tablet by mouth daily at 12 noon.    [provider]  scopolamine (TRANSDERM-SCOP) 1 MG/3DAYS Place 1 patch (1.5 mg total) onto the skin every 3 (three) days. Patient not taking: Reported on 08/23/2019 08/17/19   Julianne Handler, CNM  venlafaxine XR (EFFEXOR XR) 37.5 MG 24 hr capsule Take 1 capsule (37.5 mg total) by mouth daily with breakfast. 09/19/19   Myrtis Ser, CNM    Allergies    Keflex [cephalexin], Other, and Adhesive [tape]  Review of Systems   Review of Systems  Constitutional: Positive for chills and fever.  HENT: Negative.   Respiratory: Negative for cough and shortness of breath.   Cardiovascular: Negative for chest pain.  Gastrointestinal: Positive for abdominal pain, diarrhea, nausea and vomiting. Negative for constipation.  Genitourinary: Positive for decreased urine volume and flank pain. Negative for dysuria, frequency, vaginal bleeding and vaginal discharge.  Musculoskeletal: Negative for arthralgias and myalgias.  Skin: Negative for color change and rash.  Neurological: Negative for dizziness, syncope and light-headedness.    Physical  Exam Updated Vital Signs BP (!) 105/59 (BP Location: Left Arm)   Pulse 84   Temp 97.8 F (36.6 C) (Oral)   Resp 18   Ht 4\' 11"  (1.499 m)   Wt 80.3 kg   LMP 05/29/2019   SpO2 96%   BMI 35.75 kg/m   Physical Exam Vitals and nursing note reviewed.  Constitutional:      General: She is not in acute distress.    Appearance: Normal appearance. She is well-developed and normal weight. She is not ill-appearing or diaphoretic.  HENT:     Head: Normocephalic and atraumatic.     Mouth/Throat:     Comments: Mucous membranes slightly dry Eyes:     General:        Right eye: No discharge.        Left eye: No discharge.  Cardiovascular:     Rate and Rhythm: Normal rate and regular rhythm.     Pulses: Normal pulses.     Heart sounds: Normal heart sounds. No murmur. No friction rub. No gallop.   Pulmonary:     Effort: Pulmonary effort is normal. No respiratory distress.     Breath sounds: Normal breath sounds. No wheezing or rales.  Abdominal:     General: Bowel sounds are normal. There is no distension.     Palpations: Abdomen is soft. There is no mass.     Tenderness: There is abdominal tenderness. There is no guarding.     Comments: Gravid abdomen with mild diffuse throughout, bowel sounds present, abdomen is nondistended, no guarding or peritoneal signs  Musculoskeletal:        General: No deformity.     Cervical back: Neck supple.  Skin:    General: Skin is warm and dry.     Capillary Refill: Capillary refill takes less than 2 seconds.  Neurological:     Mental Status: She is alert.     Coordination: Coordination normal.     Comments: Speech is clear, able to follow commands Moves extremities without ataxia, coordination intact  Psychiatric:  Mood and Affect: Mood normal.        Behavior: Behavior normal.     ED Results / Procedures / Treatments   Labs (all labs ordered are listed, but only abnormal results are displayed) Labs Reviewed  COMPREHENSIVE METABOLIC  PANEL - Abnormal; Notable for the following components:      Result Value   Sodium 134 (*)    Creatinine, Ser 0.34 (*)    Calcium 8.6 (*)    Albumin 3.3 (*)    All other components within normal limits  CBC WITH DIFFERENTIAL/PLATELET - Abnormal; Notable for the following components:   Neutro Abs 8.3 (*)    All other components within normal limits  HCG, QUANTITATIVE, PREGNANCY - Abnormal; Notable for the following components:   hCG, Beta Chain, Quant, S [id number] (*)    All other components within normal limits  URINE CULTURE  LIPASE, BLOOD  URINALYSIS, ROUTINE W REFLEX MICROSCOPIC    EKG None  Radiology No results found.  Procedures Procedures (including critical care time)  Medications Ordered in ED Medications  lactated ringers bolus 1,000 mL (0 mLs Intravenous Stopped 09/21/19 2159)  promethazine (PHENERGAN) injection 25 mg (25 mg Intravenous Given 09/21/19 2008)  diphenhydrAMINE (BENADRYL) injection 25 mg (25 mg Intravenous Given 09/21/19 2008)    ED Course  I have reviewed the triage vital signs and the nursing notes.  Pertinent labs & imaging results that were available during my care of the patient were reviewed by me and considered in my medical decision making (see chart for details).    MDM Rules/Calculators/A&P                     24 year old female is currently [redacted] weeks pregnant, has had multiple family members sick with a GI bug and for the past few days has been having nausea and diarrhea, today began having vomiting and has unable to keep anything down.  Also reports decreased urination had some mild flank pain today, no CVA tenderness on exam.  She does have some generalized abdominal tenderness without guarding or peritoneal signs.  She has not felt as much fetal movement over the past 2 days.  Appropriate fetal heart tones here in the ED.  Suspect this may be related to GI bug, will check basic labs, urinalysis and culture.  And will give IV fluids and  antiemetics.  I have independently ordered, reviewed and interpreted all labs and imaging: CBC: No leukocytosis, normal hemoglobin CMP no significant electrolyte derangements, normal renal and liver function. Lipase: WNL hCG quant: [id number]  Work-up thus far has been reassuring, urinalysis pending at shift change.  Patient is working on p.o. challenge but reports significant improvement in nausea with no further vomiting here in the ED.  Care signed out to [facility] who will follow up on patient's urinalysis if there are any signs of bacteria would treat for bacteriuria in pregnancy with antibiotics, I have also sent in Reglan which patient has been given by her OB/GYN for nausea and vomiting during pregnancy.  Patient will be reassessed after p.o. challenge and if normal can be discharged home I have stressed the importance of close OB follow-up and given strict return precautions.  Suspect decreased fetal movement due to dehydration and this should improve with fluids.  Final Clinical Impression(s) / ED Diagnoses Final diagnoses:  Non-intractable vomiting with nausea, unspecified vomiting type  Generalized abdominal pain affecting pregnancy    Rx / DC Orders ED Discharge Orders  Ordered    metoCLOPramide (REGLAN) 10 MG tablet  Every 6 hours PRN     09/21/19 2243           Jacqlyn Larsen, PA-C 09/23/19 1321    Milton Ferguson, MD 09/24/19 0930

## 2019-09-21 NOTE — ED Provider Notes (Signed)
Received patient at signout from [facility].  Refer to provider note for full history and physical examination.  Briefly, patient is a 16-week pregnant female presenting with abdominal pain and vomiting.  Has had multiple sick contacts with a GI bug.  Work-up thus far has been reassuring.  She is pending p.o. challenge and UA.  If she tolerates fluids she will likely be stable for discharge with Reglan which she is she has been prescribed by her OB/GYN previously.   MDM   Labs Reviewed  COMPREHENSIVE METABOLIC PANEL - Abnormal; Notable for the following components:      Result Value   Sodium 134 (*)    Creatinine, Ser 0.34 (*)    Calcium 8.6 (*)    Albumin 3.3 (*)    All other components within normal limits  CBC WITH DIFFERENTIAL/PLATELET - Abnormal; Notable for the following components:   Neutro Abs 8.3 (*)    All other components within normal limits  URINALYSIS, ROUTINE W REFLEX MICROSCOPIC - Abnormal; Notable for the following components:   APPearance CLOUDY (*)    Ketones, ur 5 (*)    Leukocytes,Ua MODERATE (*)    Bacteria, UA RARE (*)    All other components within normal limits  HCG, QUANTITATIVE, PREGNANCY - Abnormal; Notable for the following components:   hCG, Beta Chain, Quant, S 16,333 (*)    All other components within normal limits  URINE CULTURE  LIPASE, BLOOD    UA shows rare bacteria, will treat with Macrobid due to Keflex allergy.  Will prescribe Reglan.  Patient has tolerated p.o.  She feels comfortable with discharge.  She understands to follow-up closely with her OB/GYN and will call on Monday to move up her follow-up appointment.  Discussed pushing fluids and advancing diet slowly as well as strict ED return precautions.  Patient is stable for discharge at this time.       Debroah Baller 09/21/19 2337    Milton Ferguson, MD 09/22/19 [phone]

## 2019-09-21 NOTE — Discharge Instructions (Addendum)
Your urine showed some bacteria.  In the setting of pregnancy we typically treat this with an antibiotic.  I have sent a prescription for an antibiotic to your pharmacy.  Take this 1 tablet twice daily for the next 5 days.  Continue taking Reglan every 6 hours as needed for nausea and vomiting.  Rest, drink plenty of fluids, advance diet slowly.  Make sure that you are eating small meals frequently throughout the day every 2-3 hours to make sure that your stomach is not too full or to empty.  You may find it helpful to eat ginger products such as ginger ale or chew on ginger root.  Avoid fried foods, fatty foods, acidic foods, or spicy foods that may make her symptoms worse.  Bland foods can be helpful.   Follow-up with OB/GYN for reevaluation and discussion of your work-up today.  Please return to the ER for persistent vomiting, high fevers or worsening symptoms

## 2019-09-23 LAB — URINE CULTURE: Culture: >=100000 — AB

## 2019-10-04 ENCOUNTER — Ambulatory Visit (INDEPENDENT_AMBULATORY_CARE_PROVIDER_SITE_OTHER): Payer: Medicaid Other

## 2019-10-04 ENCOUNTER — Ambulatory Visit (INDEPENDENT_AMBULATORY_CARE_PROVIDER_SITE_OTHER): Payer: Medicaid Other | Admitting: Women's Health

## 2019-10-04 ENCOUNTER — Encounter: Payer: Self-pay | Admitting: Women's Health

## 2019-10-04 VITALS — BP 123/67 | HR 125 | Wt 177.6 lb

## 2019-10-04 DIAGNOSIS — Z3480 Encounter for supervision of other normal pregnancy, unspecified trimester: Secondary | ICD-10-CM

## 2019-10-04 DIAGNOSIS — Z363 Encounter for antenatal screening for malformations: Secondary | ICD-10-CM

## 2019-10-04 DIAGNOSIS — Z3A18 18 weeks gestation of pregnancy: Secondary | ICD-10-CM

## 2019-10-04 DIAGNOSIS — Z1389 Encounter for screening for other disorder: Secondary | ICD-10-CM

## 2019-10-04 DIAGNOSIS — Z3482 Encounter for supervision of other normal pregnancy, second trimester: Secondary | ICD-10-CM | POA: Diagnosis not present

## 2019-10-04 DIAGNOSIS — Z3481 Encounter for supervision of other normal pregnancy, first trimester: Secondary | ICD-10-CM

## 2019-10-04 DIAGNOSIS — Z331 Pregnant state, incidental: Secondary | ICD-10-CM

## 2019-10-04 LAB — POCT URINALYSIS DIPSTICK OB
Blood, UA: NEGATIVE
Glucose, UA: NEGATIVE
Ketones, UA: NEGATIVE
Leukocytes, UA: NEGATIVE
Nitrite, UA: NEGATIVE
POC,PROTEIN,UA: NEGATIVE

## 2019-10-04 MED ORDER — PANTOPRAZOLE SODIUM 20 MG PO TBEC: 20.0000 mg | DELAYED_RELEASE_TABLET | Freq: Two times a day (BID) | ORAL | 6 refills | Status: DC

## 2019-10-04 MED ORDER — CYCLOBENZAPRINE HCL 10 MG PO TABS: 10.0000 mg | ORAL_TABLET | Freq: Three times a day (TID) | ORAL | 0 refills | Status: DC | PRN

## 2019-10-04 MED ORDER — BLOOD PRESSURE MONITOR MISC
0 refills | Status: DC
Start: 2019-10-04 — End: 2020-02-14

## 2019-10-04 NOTE — Patient Instructions (Addendum)
Stacey Cook, I greatly value your feedback.  If you receive a survey following your visit with [name] today, we appreciate you taking the time to fill it out.  Thanks, [name], CNM, WHNP-BC  [facility] [place] [facility] ([place], [place] [address]) Entrance C, located off of [address] parking  Go to [email or web address] to register for FREE online childbirth classes  For Dizzy Spells:   This is usually related to either your blood sugar or your blood pressure dropping  Make sure you are staying well hydrated and drinking enough water so that your urine is clear  Eat small frequent meals and snacks containing protein (meat, eggs, nuts, cheese) so that your blood sugar doesn't drop  If you do get dizzy, sit/lay down and get you something to drink and a snack containing protein- you will usually start feeling better in 10-20 minutes   For Headaches:   Stay well hydrated, drink enough water so that your urine is clear, sometimes if you are dehydrated you can get headaches  Eat small frequent meals and snacks, sometimes if you are hungry you can get headaches  Sometimes you get headaches during pregnancy from the pregnancy hormones  You can try tylenol (1-2 regular strength 325mg  or 1-2 extra strength 500mg ) as directed on the box. The least amount of medication that works is best.   Cool compresses (cool wet washcloth or ice pack) to area of head that is hurting  You can also try drinking a caffeinated drink to see if this will help  If not helping, try below:  For Prevention of Headaches/Migraines:  CoQ10 100mg  three times daily  Vitamin B2 400mg  daily  Magnesium Oxide 400-600mg  daily  If You Get a Bad Headache/Migraine:  Benadryl 25mg    Magnesium Oxide  1 large Gatorade  2 extra strength Tylenol (1,000mg  total)  1 cup coffee or Coke  If this doesn't help please call us @ [phone]  Tips to Help You Sleep Better:     Get into a bedtime routine, try to do the same thing every night before going to bed to try to help your body wind down  Warm baths  Avoid caffeine for at least 3 hours before going to sleep   Keep your room at a slightly cooler temperature, can try running a fan  Turn off TV, lights, phone, electronics  Lots of pillows if needed to help you get comfortable  Lavender scented items can help you sleep. You can place lavender essential oil on a cotton ball and place under your pillowcase, or place in a diffuser. [employer] has a lavender scented sleep line (plug-ins, sprays, etc). Look in the pillow aisle for lavender scented pillows.   If none of the above things help, you can try 1/2 to 1 tablet of benadryl, unisom, or tylenol pm. Do not take this every night, only when you really need it.       [place] Pediatricians/Family Doctors:  [facility] [phone]                 [place] [phone] (usually not accepting new patients unless you have family there already, you are always welcome to call and ask)       [facility] Department [phone]       [facility] Pediatricians/Family Doctors:   [facility]: [phone]  [facility]: [phone]  [facility]: [phone]  [facility] Doctors:   [facility]: [facility]: [place]:  [place] [facility]: [phone]    Home Blood Pressure Monitoring for Patients   Your provider has recommended that you check your blood pressure (BP) at least once a week at home. If you do not have a blood pressure cuff at home, one will be provided for you. Contact your provider if you have not received your monitor within 1 week.   Helpful Tips for Accurate Home Blood Pressure Checks  . Don't smoke, exercise,  or drink caffeine 30 minutes before checking your BP . Use the restroom before checking your BP (a full bladder can raise your pressure) . Relax in a comfortable upright chair . Feet on the ground . Left arm resting comfortably on a flat surface at the level of your heart . Legs uncrossed . Back supported . Sit quietly and don't talk . Place the cuff on your bare arm . Adjust snuggly, so that only two fingertips can fit between your skin and the top of the cuff . Check 2 readings separated by at least one minute . Keep a log of your BP readings . For a visual, please reference this diagram: http://ccnc.care/bpdiagram  Provider Name: [facility]     Phone: [phone]  Zone 1: ALL CLEAR  Continue to monitor your symptoms:  . BP reading is less than 140 (top number) or less than 90 (bottom number)  . No right upper stomach pain . No headaches or seeing spots . No feeling nauseated or throwing up . No swelling in face and hands  Zone 2: CAUTION Call your doctor's office for any of the following:  . BP reading is greater than 140 (top number) or greater than 90 (bottom number)  . Stomach pain under your ribs in the middle or right side . Headaches or seeing spots . Feeling nauseated or throwing up . Swelling in face and hands  Zone 3: EMERGENCY  Seek immediate medical care if you have any of the following:  . BP reading is greater than160 (top number) or greater than 110 (bottom number) . Severe headaches not improving with Tylenol . Serious difficulty catching your breath . Any worsening symptoms from Zone 2     Second Trimester of Pregnancy The second trimester is from week 14 through week 27 (months 4 through 6). The second trimester is often a time when you feel your best. Your body has adjusted to being pregnant, and you begin to feel better physically. Usually, morning sickness has lessened or quit completely, you may have more energy, and you may have an increase  in appetite. The second trimester is also a time when the fetus is growing rapidly. At the end of the sixth month, the fetus is about 9 inches long and weighs about 1 pounds. You will likely begin to feel the baby move (quickening) between 16 and 20 weeks of pregnancy. Body changes during your second trimester Your body continues to go through many changes during your second trimester. The changes vary from woman to woman.  Your weight will continue to increase. You will notice your lower abdomen bulging out.  You may begin to get stretch marks on your hips, abdomen, and breasts.  You may develop headaches that can be relieved by medicines. The medicines should be approved by your health care provider.  You  may urinate more often because the fetus is pressing on your bladder.  You may develop or continue to have heartburn as a result of your pregnancy.  You may develop constipation because certain hormones are causing the muscles that push waste through your intestines to slow down.  You may develop hemorrhoids or swollen, bulging veins (varicose veins).  You may have back pain. This is caused by: ? Weight gain. ? Pregnancy hormones that are relaxing the joints in your pelvis. ? A shift in weight and the muscles that support your balance.  Your breasts will continue to grow and they will continue to become tender.  Your gums may bleed and may be sensitive to brushing and flossing.  Dark spots or blotches (chloasma, mask of pregnancy) may develop on your face. This will likely fade after the baby is born.  A dark line from your belly button to the pubic area (linea nigra) may appear. This will likely fade after the baby is born.  You may have changes in your hair. These can include thickening of your hair, rapid growth, and changes in texture. Some women also have hair loss during or after pregnancy, or hair that feels dry or thin. Your hair will most likely return to normal after your  baby is born.  What to expect at prenatal visits During a routine prenatal visit:  You will be weighed to make sure you and the fetus are growing normally.  Your blood pressure will be taken.  Your abdomen will be measured to track your baby's growth.  The fetal heartbeat will be listened to.  Any test results from the previous visit will be discussed.  Your health care provider may ask you:  How you are feeling.  If you are feeling the baby move.  If you have had any abnormal symptoms, such as leaking fluid, bleeding, severe headaches, or abdominal cramping.  If you are using any tobacco products, including cigarettes, chewing tobacco, and electronic cigarettes.  If you have any questions.  Other tests that may be performed during your second trimester include:  Blood tests that check for: ? Low iron levels (anemia). ? High blood sugar that affects pregnant women (gestational diabetes) between 66 and 28 weeks. ? Rh antibodies. This is to check for a protein on red blood cells (Rh factor).  Urine tests to check for infections, diabetes, or protein in the urine.  An ultrasound to confirm the proper growth and development of the baby.  An amniocentesis to check for possible genetic problems.  Fetal screens for spina bifida and Down syndrome.  HIV (human immunodeficiency virus) testing. Routine prenatal testing includes screening for HIV, unless you choose not to have this test.  Follow these instructions at home: Medicines  Follow your health care provider's instructions regarding medicine use. Specific medicines may be either safe or unsafe to take during pregnancy.  Take a prenatal vitamin that contains at least 600 micrograms (mcg) of folic acid.  If you develop constipation, try taking a stool softener if your health care provider approves. Eating and drinking  Eat a balanced diet that includes fresh fruits and vegetables, whole grains, good sources of protein  such as meat, eggs, or tofu, and low-fat dairy. Your health care provider will help you determine the amount of weight gain that is right for you.  Avoid raw meat and uncooked cheese. These carry germs that can cause birth defects in the baby.  If you have low calcium intake from food,  talk to your health care provider about whether you should take a daily calcium supplement.  Limit foods that are high in fat and processed sugars, such as fried and sweet foods.  To prevent constipation: ? Drink enough fluid to keep your urine clear or pale yellow. ? Eat foods that are high in fiber, such as fresh fruits and vegetables, whole grains, and beans. Activity  Exercise only as directed by your health care provider. Most women can continue their usual exercise routine during pregnancy. Try to exercise for 30 minutes at least 5 days a week. Stop exercising if you experience uterine contractions.  Avoid heavy lifting, wear low heel shoes, and practice good posture.  A sexual relationship may be continued unless your health care provider directs you otherwise. Relieving pain and discomfort  Wear a good support bra to prevent discomfort from breast tenderness.  Take warm sitz baths to soothe any pain or discomfort caused by hemorrhoids. Use hemorrhoid cream if your health care provider approves.  Rest with your legs elevated if you have leg cramps or low back pain.  If you develop varicose veins, wear support hose. Elevate your feet for 15 minutes, 3-4 times a day. Limit salt in your diet. Prenatal Care  Write down your questions. Take them to your prenatal visits.  Keep all your prenatal visits as told by your health care provider. This is important. Safety  Wear your seat belt at all times when driving.  Make a list of emergency phone numbers, including numbers for family, friends, the hospital, and police and fire departments. General instructions  Ask your health care provider for a  referral to a local prenatal education class. Begin classes no later than the beginning of month 6 of your pregnancy.  Ask for help if you have counseling or nutritional needs during pregnancy. Your health care provider can offer advice or refer you to specialists for help with various needs.  Do not use hot tubs, steam rooms, or saunas.  Do not douche or use tampons or scented sanitary pads.  Do not cross your legs for long periods of time.  Avoid cat litter boxes and soil used by cats. These carry germs that can cause birth defects in the baby and possibly loss of the fetus by miscarriage or stillbirth.  Avoid all smoking, herbs, alcohol, and unprescribed drugs. Chemicals in these products can affect the formation and growth of the baby.  Do not use any products that contain nicotine or tobacco, such as cigarettes and e-cigarettes. If you need help quitting, ask your health care provider.  Visit your dentist if you have not gone yet during your pregnancy. Use a soft toothbrush to brush your teeth and be gentle when you floss. Contact a health care provider if:  You have dizziness.  You have mild pelvic cramps, pelvic pressure, or nagging pain in the abdominal area.  You have persistent nausea, vomiting, or diarrhea.  You have a bad smelling vaginal discharge.  You have pain when you urinate. Get help right away if:  You have a fever.  You are leaking fluid from your vagina.  You have spotting or bleeding from your vagina.  You have severe abdominal cramping or pain.  You have rapid weight gain or weight loss.  You have shortness of breath with chest pain.  You notice sudden or extreme swelling of your face, hands, ankles, feet, or legs.  You have not felt your baby move in over an hour.  You  have severe headaches that do not go away when you take medicine.  You have vision changes. Summary  The second trimester is from week 14 through week 27 (months 4 through 6).  It is also a time when the fetus is growing rapidly.  Your body goes through many changes during pregnancy. The changes vary from woman to woman.  Avoid all smoking, herbs, alcohol, and unprescribed drugs. These chemicals affect the formation and growth your baby.  Do not use any tobacco products, such as cigarettes, chewing tobacco, and e-cigarettes. If you need help quitting, ask your health care provider.  Contact your health care provider if you have any questions. Keep all prenatal visits as told by your health care provider. This is important. This information is not intended to replace advice given to you by your health care provider. Make sure you discuss any questions you have with your health care provider. Document Released: 04/20/2001 Document Revised: 10/02/2015 Document Reviewed: 06/27/2012 Elsevier Interactive Patient Education  2017 [place] FLU! Because you are pregnant, we at [facility], along with the Centers for Disease Control (CDC), recommend that you receive the flu vaccine to protect yourself and your baby from the flu. The flu is more likely to cause severe illness in pregnant women than in women of reproductive age who are not pregnant. Changes in the immune system, heart, and lungs during pregnancy make pregnant women (and women up to two weeks postpartum) more prone to severe illness from flu, including illness resulting in hospitalization. Flu also may be harmful for a pregnant woman's developing baby. A common flu symptom is fever, which may be associated with neural tube defects and other adverse outcomes for a developing baby. Getting vaccinated can also help protect a baby after birth from flu. (Mom passes antibodies onto the developing baby during her pregnancy.)  A Flu Vaccine is the Best Protection Against Flu Getting a flu vaccine is the first and most important step in protecting against flu. Pregnant women should  get a flu shot and not the live attenuated influenza vaccine (LAIV), also known as nasal spray flu vaccine. Flu vaccines given during pregnancy help protect both the mother and her baby from flu. Vaccination has been shown to reduce the risk of flu-associated acute respiratory infection in pregnant women by up to one-half. A 2018 study showed that getting a flu shot reduced a pregnant woman's risk of being hospitalized with flu by an average of 40 percent. Pregnant women who get a flu vaccine are also helping to protect their babies from flu illness for the first several months after their birth, when they are too young to get vaccinated.   A Long Record of Safety for Flu Shots in Pregnant Women Flu shots have been given to millions of pregnant women over many years with a good safety record. There is a lot of evidence that flu vaccines can be given safely during pregnancy; though these data are limited for the first trimester. The CDC recommends that pregnant women get vaccinated during any trimester of their pregnancy. It is very important for pregnant women to get the flu shot.   Other Preventive Actions In addition to getting a flu shot, pregnant women should take the same everyday preventive actions the CDC recommends of everyone, including covering coughs, washing hands often, and avoiding people who are sick.  Symptoms and Treatment If you get sick with flu symptoms call your doctor right away.  There are antiviral drugs that can treat flu illness and prevent serious flu complications. The CDC recommends prompt treatment for people who have influenza infection or suspected influenza infection and who are at high risk of serious flu complications, such as people with asthma, diabetes (including gestational diabetes), or heart disease. Early treatment of influenza in hospitalized pregnant women has been shown to reduce the length of the hospital stay.  Symptoms Flu symptoms include fever, cough, sore  throat, runny or stuffy nose, body aches, headache, chills and fatigue. Some people may also have vomiting and diarrhea. People may be infected with the flu and have respiratory symptoms without a fever.  Early Treatment is Important for Pregnant Women Treatment should begin as soon as possible because antiviral drugs work best when started early (within 48 hours after symptoms start). Antiviral drugs can make your flu illness milder and make you feel better faster. They may also prevent serious health problems that can result from flu illness. Oral oseltamivir (Tamiflu) is the preferred treatment for pregnant women because it has the most studies available to suggest that it is safe and beneficial. Antiviral drugs require a prescription from your provider. Having a fever caused by flu infection or other infections early in pregnancy may be linked to birth defects in a baby. In addition to taking antiviral drugs, pregnant women who get a fever should treat their fever with Tylenol (acetaminophen) and contact their provider immediately.  When to [place] If you are pregnant and have any of these signs, seek care immediately:  Difficulty breathing or shortness of breath  Pain or pressure in the chest or abdomen  Sudden dizziness  Confusion  Severe or persistent vomiting  High fever that is not responding to Tylenol (or store brand equivalent)  Decreased or no movement of your baby  [email or web address]

## 2019-10-04 NOTE — Progress Notes (Signed)
LOW-RISK PREGNANCY VISIT Patient name: Stacey Cook MRN [id number]  Date of birth: 1995-10-09 Chief Complaint:   Routine Prenatal Visit  History of Present Illness:   Stacey Cook is a 24 y.o. [id number] female at [redacted]w[redacted]d with an Estimated Date of Delivery: 03/04/20 being seen today for ongoing management of a low-risk pregnancy.  Depression screen [facility] 2/9 08/23/2019 06/06/2018 04/28/2017  Decreased Interest 1 2 3   Down, Depressed, Hopeless 1 3 1   PHQ - 2 Score 2 5 4   Altered sleeping 3 3 3   Tired, decreased energy 3 3 3   Change in appetite 2 2 2   Feeling bad or failure about yourself  1 1 0  Trouble concentrating 1 1 0  Moving slowly or fidgety/restless 0 1 0  Suicidal thoughts 0 1 0  PHQ-9 Score 12 17 12   Difficult doing work/chores Somewhat difficult - Very difficult  Some recent data might be hidden    Today she reports syncopal episode while at [facility] 5/3 getting her hearing aid updated, had severe headache right after so CT head was done which was normal. Only voids 3x/day which is unusual for her. Trouble sleeping. Headaches. Heartburn-has to take a lot of TUMS to help, wants rx. Wants refill on flexeril for low back pain. Still undecided about RCS vs TOLAC. Contractions: Not present. Vag. Bleeding: None.  Movement: Present. denies leaking of fluid. Review of Systems:   Pertinent items are noted in HPI Denies abnormal vaginal discharge w/ itching/odor/irritation, headaches, visual changes, shortness of breath, chest pain, abdominal pain, severe nausea/vomiting, or problems with urination or bowel movements unless otherwise stated above. Pertinent History Reviewed:  Reviewed past medical,surgical, social, obstetrical and family history.  Reviewed problem list, medications and allergies. Physical Assessment:   Vitals:   10/04/19 1550  BP: 123/67  Pulse: (!) 125  Weight: 177 lb 9.6 oz (80.6 kg)  Body mass index is 35.87 kg/m.        Physical Examination:   General  appearance: Well appearing, and in no distress  Mental status: Alert, oriented to person, place, and time  Skin: Warm & dry  Cardiovascular: Normal heart rate noted  Respiratory: Normal respiratory effort, no distress  Abdomen: Soft, gravid, nontender  Pelvic: Cervical exam deferred         Extremities: Edema: None  Fetal Status: Fetal Heart Rate (bpm): 161 u/s   Movement: Present   [name] 18+2 wks,breech,cx 3.4 cm,posterior fundal placenta gr 0,normal ovaries,svp of fluid 4.2 cm,fhr 161 bpm,efw 250 g 66%,anatomy complete,no obvious abnormalities   Chaperone: n/a    Results for orders placed or performed in visit on 10/04/19 (from the past 24 hour(s))  POC Urinalysis Dipstick OB   Collection Time: 10/04/19  3:51 PM  Result Value Ref Range   Color, UA     Clarity, UA     Glucose, UA Negative Negative   Bilirubin, UA     Ketones, UA neg    Spec Grav, UA     Blood, UA neg    pH, UA     POC,PROTEIN,UA Negative Negative, Trace, Small (1+), Moderate (2+), Large (3+), 4+   Urobilinogen, UA     Nitrite, UA neg    Leukocytes, UA Negative Negative   Appearance     Odor      Assessment & Plan:  1) Low-risk pregnancy [id number] at [redacted]w[redacted]d with an Estimated Date of Delivery: 03/04/20   2) Dizzy spells, recent syncopal episode, neg head CT, gave printed prevention/relief measures  3) Headaches> gave printed prevention/relief measures, has fioricet rx  4) Heartburn> rx protonix  5) Trouble sleeping> gave printed prevention/relief measures   6) Low back pain> refilled flexeril  7) Prev SD w/ PPH> EFW @ 36wks  8) Prev c/s> for h/o SD, undecided if wants RCS vs. TOLAC, wants to see what EFW is @ 36wks, consent given to take home and review   Meds:  Meds ordered this encounter  Medications  . cyclobenzaprine (FLEXERIL) 10 MG tablet    Sig: Take 1 tablet (10 mg total) by mouth 3 (three) times daily as needed (back pain). May use 1/2 tablet for mild back pain.    Dispense:  20 tablet     Refill:  0    Order Specific Question:   Supervising Provider    Answer:   Elonda Husky, LUTHER H [[id number]]  . pantoprazole (PROTONIX) 20 MG tablet    Sig: Take 1 tablet (20 mg total) by mouth 2 (two) times daily.    Dispense:  30 tablet    Refill:  6    Order Specific Question:   Supervising Provider    Answer:   Elonda Husky, LUTHER H [[id number]]  . Blood Pressure Monitor MISC    Sig: For regular home bp monitoring during pregnancy    Dispense:  1 each    Refill:  0    Dx: z34.90    Order Specific Question:   Supervising Provider    Answer:   [name] [[id number]]   Labs/procedures today: anatomy u/s  Plan:  Continue routine obstetrical care  Next visit: prefers online    Reviewed: Preterm labor symptoms and general obstetric precautions including but not limited to vaginal bleeding, contractions, leaking of fluid and fetal movement were reviewed in detail with the patient.  All questions were answered. Does not have home bp cuff. Rx faxed to [facility]. Check bp weekly, let us know if >140/90.   Follow-up: Return in about 4 weeks (around 11/01/2019) for LROB, CNM, MyChart Video.  Orders Placed This Encounter  Procedures  . POC Urinalysis Dipstick OB   Roma Schanz CNM, [facility] 10/04/2019 4:46 PM

## 2019-10-04 NOTE — Progress Notes (Signed)
[name] 18+2 wks,breech,cx 3.4 cm,posterior fundal placenta gr 0,normal ovaries,svp of fluid 4.2 cm,fhr 161 bpm,efw 250 g 66%,anatomy complete,no obvious abnormalities

## 2019-11-01 ENCOUNTER — Telehealth (INDEPENDENT_AMBULATORY_CARE_PROVIDER_SITE_OTHER): Payer: Medicaid Other | Admitting: Advanced Practice Midwife

## 2019-11-01 ENCOUNTER — Encounter: Payer: Self-pay | Admitting: Advanced Practice Midwife

## 2019-11-01 VITALS — BP 111/60 | HR 84

## 2019-11-01 DIAGNOSIS — N898 Other specified noninflammatory disorders of vagina: Secondary | ICD-10-CM

## 2019-11-01 DIAGNOSIS — L299 Pruritus, unspecified: Secondary | ICD-10-CM

## 2019-11-01 DIAGNOSIS — Z3480 Encounter for supervision of other normal pregnancy, unspecified trimester: Secondary | ICD-10-CM

## 2019-11-01 DIAGNOSIS — Z3A20 20 weeks gestation of pregnancy: Secondary | ICD-10-CM

## 2019-11-01 DIAGNOSIS — Z3482 Encounter for supervision of other normal pregnancy, second trimester: Secondary | ICD-10-CM

## 2019-11-01 NOTE — Patient Instructions (Addendum)
Why am I having leg cramps during pregnancy?  No one really knows why pregnant women get more leg cramps. It's possible that your leg muscles are tired from carrying around all of your extra weight. Or they may be aggravated by the pressure your expanding uterus puts on the blood vessels that return blood from your legs to your heart and the nerves that lead from your trunk to your legs.  Leg cramps may start to plague you during your second trimester and may get worse as your pregnancy progresses and your belly gets bigger. While these cramps can occur during the day, you'll probably notice them most at night, when they can interfere with your ability to get a good night's sleep.  How can I prevent leg cramps?  Try these tips for keeping leg cramps at bay:  Avoid standing or sitting with your legs crossed for long periods of time. Stretch your calf muscles regularly during the day and several times before you go to bed. Rotate your ankles and wiggle your toes when you sit, eat dinner, or watch TV. Take a walk every day, unless your midwife or doctor has advised you not to exercise. Avoid getting too tired. Lie down on your left side to improve circulation to and from your legs. Stay hydrated during the day by drinking water regularly. Try a warm bath before bed to relax your muscles. Some research suggests that taking a magnesium supplement in addition to a prenatal vitamin may help some women avoid leg cramps. However, other research showed that magnesium supplements had no significant effect on the frequency or intensity of leg cramps during pregnancy.You may have heard that having leg cramps is a sign that you need more calcium, and that calcium supplements will relieve the problem. Though it's certainly important to get enough calcium, there's no good evidence that taking extra calcium will help prevent leg cramps during pregnancy. In fact, in one well-designed study, pregnant women taking  calcium got no more relief from leg cramps than those taking a placebo.  You may try Chelated Magnesium at a dosage of 240-300mg /day.  What's the best way to relieve a cramp when I get one?  If you do get a cramp, immediately stretch your calf muscles: Straighten your leg, heel first, and gently flex your toes back toward your shins. It might hurt at first, but it will ease the spasm and the pain will gradually go away.  You can try to relax the cramp by massaging the muscle or warming it with a hot water bottle. Walking around for a few minutes may help too.  What if the pain persists?  Call your practitioner if your muscle pain is constant and not just an occasional cramp or if you notice swelling, redness, or tenderness in your leg, or the area feels warm to your touch. These may be signs of a blood clot, which requires immediate medical attention. Blood clots are relatively rare, but they're more common during pregnancy.      Tips to Help Leg Cramps  Increase dietary sources of calcium (milk, yogurt, cheese, leafy greens, seafood, legumes, and fruit) and magnesium (dark leafy greens, nuts, seeds, fish, beans, whole grains, avocados, yogurt, bananas, dried fruit, dark chocolate)  Spoonful of regular yellow mustard every night  Pickle juice  Magnesium supplement: 70mmol in the morning, 35mmol at night (can find in the vitamin aisle)  Dorsiflexion of foot: pointing your toes back towards your knee during the cramp  1. Before your test, do not eat or drink anything for 8-10 hours prior to your  appointment (a small amount of water is allowed and you may take any medicines you normally take). Be sure to drink lots of water the day before. 2. When you arrive, your blood will be drawn for a 'fasting' blood sugar level.  Then you will be given a sweetened carbonated beverage to drink. You should  complete drinking this beverage within five minutes. After finishing the  beverage, you  will have your blood drawn exactly 1 and 2 hours later. Having  your blood drawn on time is an important part of this test. A total of three blood  samples will be done. 3. The test takes approximately 2  hours. During the test, do not have anything to  eat or drink. Do not smoke, chew gum (not even sugarless gum) or use breath mints.  4. During the test you should remain close by and seated as much as possible and  avoid walking around. You may want to bring a book or something else to  occupy your time.  5. After your test, you may eat and drink as normal. You may want to bring a snack  to eat after the test is finished. Your provider will advise you as to the results of  this test and any follow-up if necessary  If your sugar test is positive for gestational diabetes, you will be given an phone call and further instructions discussed. If you wish to know all of your test results before your next appointment, feel free to call the office, or look up your test results on Mychart.  (The range that the lab uses for normal values of the sugar test are not necessarily the range that is used for pregnant women; if your results are within the normal range, they are definitely normal.  However, if a value is deemed "high" by the lab, it may not be too high for a pregnant woman.  We will need to discuss the results if your value(s) fall in the "high" category).     Tdap Vaccine  It is recommended that you get the Tdap vaccine during the third trimester of EACH pregnancy to help protect your baby from getting pertussis (whooping cough)  27-36 weeks is the BEST time to do this so that you can pass the protection on to your baby. During pregnancy is better than after pregnancy, but if you are unable to get it during pregnancy it will be offered at the hospital.  You will be offered this vaccine in the office after 27 weeks.  If you do not have health insurance, you can get the vaccine from the [facility] Department (no appointment needed).   Everyone who will be around your baby should also be up-to-date on their vaccines. Adults (who are not pregnant) only need 1 dose of Tdap during adulthood.

## 2019-11-01 NOTE — Progress Notes (Addendum)
TELEHEALTH OBSTETRICS VISIT ENCOUNTER NOTE  I connected with Stacey Cook on 11/01/19 at  1:50 PM EDT by telephone at home and verified that I am speaking with the correct person using two identifiers.   I discussed the limitations, risks, security and privacy concerns of performing an evaluation and management service by telephone and the availability of in person appointments. I also discussed with the patient that there may be a patient responsible charge related to this service. The patient expressed understanding and agreed to proceed.  Subjective:  Stacey Cook is a 24 y.o. [phone] at [redacted]w[redacted]d being followed for ongoing prenatal care.  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; Asthma, mild intermittent; Binge-eating disorder, in partial remission, moderate; Cholesteatoma of attic of right ear; Perforation of left tympanic membrane; Vasovagal syncope; Conductive hearing loss, unilat, unrestrict hearing contralateral side; History of miscarriage; Depression with anxiety and suicidal ideations; Chest pain due to GERD; History of gestational diabetes; History of cholestasis during pregnancy; History of shoulder dystocia in prior pregnancy; History of postpartum hemorrhage; Acute gallstone pancreatitis; Pancreatitis; 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  Patient reports :  Leg cramps "charlie horse" type.  Itching on tops of feet and abdomen, no  rash (hx of ICP that started this way)  Some pink bleeding for 2 days last week, no pain/ctx/cramps.  Has intermittent vaginal itch/burning, no odor.   Hasn't felt FM in 2 days   Denies any contractions, bleeding or leaking of fluid.   The following portions of the patient's history were reviewed and updated as appropriate: allergies, current medications, past family history, past medical history, past social history, past surgical history and problem list.   Objective:   General:  Alert, oriented and cooperative.   Mental Status: Normal mood and affect perceived. Normal judgment and thought content.  Rest of physical exam deferred due to type of encounter  Assessment and Plan:  Pregnancy: [id number] at [redacted]w[redacted]d 1. Encounter for supervision of other normal pregnancy, unspecified trimester   2.  Itching--check bile acids  3.  Spotting w/occ itch, burn, no odor.  Will check swab  4.  No FM in 2 days--come in and let us check the FHT.Marland Kitchenaddendum:  FHR 152, movement heard w/doppler (not felt, but pt reassured).   Preterm labor symptoms and general obstetric precautions including but not limited to vaginal bleeding, contractions, leaking of fluid and fetal movement were reviewed in detail with the patient.  I discussed the assessment and treatment plan with the patient. The patient was provided an opportunity to ask questions and all were answered. The patient agreed with the plan and demonstrated an understanding of the instructions. The patient was advised to call back or seek an in-person office evaluation/go to [facility] at [facility] for any urgent or concerning symptoms. Please refer to After Visit Summary for other counseling recommendations.   I provided 15 minutes of non-face-to-face time during this encounter.  Return in about 4 weeks (around 11/29/2019) for PN2/LROB, sign TOLAC form.  No future appointments.  Christin Fudge,  [place] for [employer],  [place]

## 2019-11-04 ENCOUNTER — Inpatient Hospital Stay (HOSPITAL_COMMUNITY)
Admission: AD | Admit: 2019-11-04 | Discharge: 2019-11-05 | Disposition: A | Discharge status: Home / Self Care | Payer: Medicaid Other | Attending: Obstetrics and Gynecology | Admitting: Obstetrics and Gynecology

## 2019-11-04 ENCOUNTER — Encounter (HOSPITAL_COMMUNITY): Payer: Self-pay | Admitting: Obstetrics and Gynecology

## 2019-11-04 ENCOUNTER — Other Ambulatory Visit: Payer: Self-pay

## 2019-11-04 DIAGNOSIS — B373 Candidiasis of vulva and vagina: Secondary | ICD-10-CM | POA: Insufficient documentation

## 2019-11-04 DIAGNOSIS — B3731 Acute candidiasis of vulva and vagina: Secondary | ICD-10-CM

## 2019-11-04 DIAGNOSIS — O99332 Smoking (tobacco) complicating pregnancy, second trimester: Secondary | ICD-10-CM | POA: Insufficient documentation

## 2019-11-04 DIAGNOSIS — R109 Unspecified abdominal pain: Secondary | ICD-10-CM | POA: Diagnosis not present

## 2019-11-04 DIAGNOSIS — Z79899 Other long term (current) drug therapy: Secondary | ICD-10-CM | POA: Diagnosis not present

## 2019-11-04 DIAGNOSIS — O98812 Other maternal infectious and parasitic diseases complicating pregnancy, second trimester: Secondary | ICD-10-CM | POA: Insufficient documentation

## 2019-11-04 DIAGNOSIS — G8929 Other chronic pain: Secondary | ICD-10-CM | POA: Insufficient documentation

## 2019-11-04 DIAGNOSIS — M545 Low back pain, unspecified: Secondary | ICD-10-CM

## 2019-11-04 DIAGNOSIS — F17210 Nicotine dependence, cigarettes, uncomplicated: Secondary | ICD-10-CM | POA: Diagnosis not present

## 2019-11-04 DIAGNOSIS — O26892 Other specified pregnancy related conditions, second trimester: Secondary | ICD-10-CM | POA: Diagnosis present

## 2019-11-04 DIAGNOSIS — Z3A22 22 weeks gestation of pregnancy: Secondary | ICD-10-CM | POA: Insufficient documentation

## 2019-11-04 NOTE — MAU Note (Signed)
Patient reports to MAU for abdominal and back pain that comes and goes.denies VB/LOF, +movement.

## 2019-11-05 ENCOUNTER — Inpatient Hospital Stay (HOSPITAL_COMMUNITY): Payer: Medicaid Other

## 2019-11-05 DIAGNOSIS — B373 Candidiasis of vulva and vagina: Secondary | ICD-10-CM

## 2019-11-05 DIAGNOSIS — G8929 Other chronic pain: Secondary | ICD-10-CM

## 2019-11-05 DIAGNOSIS — Z3A22 22 weeks gestation of pregnancy: Secondary | ICD-10-CM

## 2019-11-05 DIAGNOSIS — R109 Unspecified abdominal pain: Secondary | ICD-10-CM

## 2019-11-05 DIAGNOSIS — M545 Low back pain: Secondary | ICD-10-CM

## 2019-11-05 DIAGNOSIS — O26892 Other specified pregnancy related conditions, second trimester: Secondary | ICD-10-CM

## 2019-11-05 LAB — COMPREHENSIVE METABOLIC PANEL
ALT: 10 U/L (ref 0–44)
AST: 14 U/L — ABNORMAL LOW (ref 15–41)
Albumin: 2.8 g/dL — ABNORMAL LOW (ref 3.5–5.0)
Alkaline Phosphatase: 85 U/L (ref 38–126)
Anion gap: 8 (ref 5–15)
BUN: 8 mg/dL (ref 6–20)
CO2: 22 mmol/L (ref 22–32)
Calcium: 8.9 mg/dL (ref 8.9–10.3)
Chloride: 106 mmol/L (ref 98–111)
Creatinine, Ser: 0.45 mg/dL (ref 0.44–1.00)
GFR calc Af Amer: >60 mL/min (ref >60)
GFR calc non Af Amer: >60 mL/min (ref >60)
Glucose, Bld: 105 mg/dL — ABNORMAL HIGH (ref 70–99)
Potassium: 3.8 mmol/L (ref 3.5–5.1)
Sodium: 136 mmol/L (ref 135–145)
Total Bilirubin: 0.7 mg/dL (ref 0.3–1.2)
Total Protein: 6 g/dL — ABNORMAL LOW (ref 6.5–8.1)

## 2019-11-05 LAB — CBC WITH DIFFERENTIAL/PLATELET
Abs Immature Granulocytes: 0.09 10*3/uL — ABNORMAL HIGH (ref 0.00–0.07)
Basophils Absolute: 0.1 10*3/uL (ref 0.0–0.1)
Basophils Relative: 1 %
Eosinophils Absolute: 0.3 10*3/uL (ref 0.0–0.5)
Eosinophils Relative: 3 %
HCT: 33.2 % — ABNORMAL LOW (ref 36.0–46.0)
Hemoglobin: 11.3 g/dL — ABNORMAL LOW (ref 12.0–15.0)
Immature Granulocytes: 1 %
Lymphocytes Relative: 23 %
Lymphs Abs: 2.3 10*3/uL (ref 0.7–4.0)
MCH: 30.4 pg (ref 26.0–34.0)
MCHC: 34 g/dL (ref 30.0–36.0)
MCV: 89.2 fL (ref 80.0–100.0)
Monocytes Absolute: 0.9 10*3/uL (ref 0.1–1.0)
Monocytes Relative: 9 %
Neutro Abs: 6.7 10*3/uL (ref 1.7–7.7)
Neutrophils Relative %: 63 %
Platelets: 182 10*3/uL (ref 150–400)
RBC: 3.72 MIL/uL — ABNORMAL LOW (ref 3.87–5.11)
RDW: 14.3 % (ref 11.5–15.5)
WBC: 10.4 10*3/uL (ref 4.0–10.5)
nRBC: 0 % (ref 0.0–0.2)

## 2019-11-05 LAB — WET PREP, GENITAL
Sperm: NONE SEEN
Trich, Wet Prep: NONE SEEN

## 2019-11-05 LAB — URINALYSIS, ROUTINE W REFLEX MICROSCOPIC
Bilirubin Urine: NEGATIVE
Glucose, UA: NEGATIVE mg/dL
Hgb urine dipstick: NEGATIVE
Ketones, ur: NEGATIVE mg/dL
Nitrite: NEGATIVE
Protein, ur: 30 mg/dL — AB
Specific Gravity, Urine: 1.023 (ref 1.005–1.030)
pH: 7 (ref 5.0–8.0)

## 2019-11-05 LAB — GC/CHLAMYDIA PROBE AMP (~~LOC~~) NOT AT ARMC
Chlamydia: NEGATIVE
Comment: NEGATIVE
Comment: NORMAL
Neisseria Gonorrhea: NEGATIVE

## 2019-11-05 MED ORDER — COMFORT FIT MATERNITY SUPP LG MISC
1.0000 [IU] | Freq: Every day | 0 refills | Status: DC
Start: 2019-11-05 — End: 2020-01-31

## 2019-11-05 MED ORDER — HYDROMORPHONE HCL 1 MG/ML IJ SOLN
1.0000 mg | Freq: Once | INTRAMUSCULAR | Status: AC
  Administered 2019-11-05: 1 mg via INTRAMUSCULAR
  Filled 2019-11-05: qty 1

## 2019-11-05 MED ORDER — TERCONAZOLE 0.4 % VA CREA
1.0000 | TOPICAL_CREAM | Freq: Every day | VAGINAL | 0 refills | Status: DC
Start: 2019-11-05 — End: 2019-11-28

## 2019-11-05 MED ORDER — OXYCODONE-ACETAMINOPHEN 5-325 MG PO TABS
1.0000 | ORAL_TABLET | Freq: Once | ORAL | Status: AC
  Administered 2019-11-05: 1 via ORAL
  Filled 2019-11-05: qty 1

## 2019-11-05 MED ORDER — ONDANSETRON 4 MG PO TBDP
8.0000 mg | ORAL_TABLET | Freq: Once | ORAL | Status: AC
  Administered 2019-11-05: 8 mg via ORAL
  Filled 2019-11-05: qty 2

## 2019-11-05 NOTE — Discharge Instructions (Signed)
   PREGNANCY SUPPORT BELT: °You are not alone, Seventy-five percent of women have some sort of abdominal or back pain at some point in their pregnancy. Your baby is growing at a fast pace, which means that your whole body is rapidly trying to adjust to the changes. As your uterus grows, your back may start feeling a bit under stress and this can result in back or abdominal pain that can go from mild, and therefore bearable, to severe pains that will not allow you to sit or lay down comfortably, When it comes to dealing with pregnancy-related pains and cramps, some pregnant women usually prefer natural remedies, which the market is filled with nowadays. For example, wearing a pregnancy support belt can help ease and lessen your discomfort and pain. °WHAT ARE THE BENEFITS OF WEARING A PREGNANCY SUPPORT BELT? A pregnancy support belt provides support to the lower portion of the belly taking some of the weight of the growing uterus and distributing to the other parts of your body. It is designed make you comfortable and gives you extra support. Over the years, the pregnancy apparel market has been studying the needs and wants of pregnant women and they have come up with the most comfortable pregnancy support belts that woman could ever ask for. In fact, you will no longer have to wear a stretched-out or bulky pregnancy belt that is visible underneath your clothes and makes you feel even more uncomfortable. Nowadays, a pregnancy support belt is made of comfortable and stretchy materials that will not irritate your skin but will actually make you feel at ease and you will not even notice you are wearing it. They are easy to put on and adjust during the day and can be worn at night for additional support.  °BENEFITS: °• Relives Back pain °• Relieves Abdominal Muscle and Leg Pain °• Stabilizes the Pelvic Ring °• Offers a Cushioned Abdominal Lift Pad °• Relieves pressure on the Sciatic Nerve Within Minutes °WHERE TO GET  YOUR PREGNANCY BELT: [employer] [phone] @[address] [place], [place] [address] ° ° °

## 2019-11-05 NOTE — MAU Provider Note (Signed)
History     CSN: [id number]  Arrival date and time: 11/04/19 2258   First Provider Initiated Contact with Patient 11/04/19 2351      Chief Complaint  Patient presents with  . Abdominal Pain   Stacey Cook is a 24 y.o. G5P2 at [redacted]w[redacted]d who presents to MAU with complaints of abdominal pain and back pain. Patient reports pain started occurring yesterday around midnight. She reports it was initially lower abdominal pain that she describes as intermittent cramping and pressure. She reports pain radiated around to back. She reports that pain is more constant and specifically with back and not abdomen. Rates pain 9.5/10- has tried Tylenol and Flexeril at home without relief. Patient reports hx of kidney infections and urinary infections in past. Patient reports a hx of chronic back pain and needing physical therapy with last pregnancy. She reports vaginal discharge that she was recently tested for but has not gotten results back. She denies vaginal bleeding, LOF. +FM.    OB History    Gravida  5   Para  2   Term  2   Preterm  0   AB  2   Living  2     SAB  2   TAB  0   Ectopic  0   Multiple  0   Live Births  2           Past Medical History:  Diagnosis Date  . Abdominal pain   . Anemia   . Anginal pain ([place])   . Anxiety   . Arthritis    knees  . Asthma   . Cervicalgia   . Cholestasis during pregnancy   . Chronic abdominal pain   . Complication of anesthesia    light headed  . Constipation   . Ear mass   . Gestational diabetes    pt states not been checking sugars regularly at home; nor has she been taking her Metformin  . Gestational diabetes   . Headache   . 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 HSV infection   . Low iron   . Mental disorder   . PONV (postoperative nausea and vomiting)   . Suicidal intent   . UTI (lower urinary tract infection) 05/2014  . Vomiting     Past Surgical History:  Procedure Laterality Date  . CESAREAN  SECTION N/A 01/03/2019   Procedure: CESAREAN SECTION;  Surgeon: [name], MD;  Location: [facility];  Service: Obstetrics;  Laterality: N/A;  . CHOLECYSTECTOMY N/A 01/16/2018   Procedure: LAPAROSCOPIC CHOLECYSTECTOMY;  Surgeon: Aviva Signs, MD;  Location: [facility];  Service: General;  Laterality: N/A;  per Dr. Arnoldo Morale, pt will most likely go home and he will tell her to arrive at 10:45 - already has labs  . CHOLESTEATOMA EXCISION    . COCHLEAR IMPLANT Right   . DILATION AND EVACUATION N/A 09/11/2016   Procedure: DILATATION AND CURRATAGE  2ND TRIMESTER;  Surgeon: Jonnie Kind, MD;  Location: [facility];  Service: Gynecology;  Laterality: N/A;  . IMPLANTATION BONE ANCHORED HEARING AID Right 04/2013  . INNER EAR SURGERY Right 03/31/2018  . 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 TYMPANOPLASTY Left   . TYMPANOSTOMY      Family History  Problem Relation Age of Onset  . Cholelithiasis Mother   . Kidney disease Mother        stones  .  Depression Mother   . Hypertension Maternal Grandmother   . Diabetes Maternal Grandmother   . Stroke Maternal Grandmother   . Seizures Maternal Grandmother   . Asthma Maternal Grandmother   . Hyperlipidemia Maternal Grandmother   . Thyroid disease Maternal Grandmother   . Asthma Brother   . Seizures Maternal Uncle   . Cancer Other        breast- great aunt  . Celiac disease Neg Hx     Social History   Tobacco Use  . Smoking status: Current Every Day Smoker    Packs/day: 0.25    Years: 1.00    Pack years: 0.25    Types: Cigarettes    Last attempt to quit: 03/11/2017    Years since quitting: 2.6  . Smokeless tobacco: Never Used  . Tobacco comment: "3-4 cigs a day "  Vaping Use  . Vaping Use: Former  Substance Use Topics  . Alcohol use: No    Alcohol/week: 0.0 standard drinks  . Drug use: No    Allergies:  Allergies  Allergen Reactions  . Keflex [Cephalexin] Other (See Comments)    Reaction:  Elevated liver enzymes   .  Other Other (See Comments)    Pt is allergic to surgical glue swelling;  Cats-congestion, sneezing, eyes watery, itching all over Reaction:  Infection   . Adhesive [Tape] Rash    Medications Prior to Admission  Medication Sig Dispense Refill Last Dose  . acetaminophen (TYLENOL) 500 MG tablet Take 500 mg by mouth as needed.   11/04/2019 at Unknown time  . cyclobenzaprine (FLEXERIL) 10 MG tablet Take 1 tablet (10 mg total) by mouth 3 (three) times daily as needed (back pain). May use 1/2 tablet for mild back pain. 20 tablet 0 11/04/2019 at Unknown time  . metoCLOPramide (REGLAN) 10 MG tablet Take 1 tablet (10 mg total) by mouth every 6 (six) hours as needed for nausea. 30 tablet 0 11/03/2019 at Unknown time  . pantoprazole (PROTONIX) 20 MG tablet Take 1 tablet (20 mg total) by mouth 2 (two) times daily. 30 tablet 6 Past Week at Unknown time  . Prenatal Vit-Fe Fumarate-FA (MULTIVITAMIN-PRENATAL) 27-0.8 MG TABS tablet Take 1 tablet by mouth daily at 12 noon.    11/04/2019 at Unknown time  . venlafaxine XR (EFFEXOR XR) 37.5 MG 24 hr capsule Take 1 capsule (37.5 mg total) by mouth daily with breakfast. 30 capsule 6 11/04/2019 at Unknown time  . Blood Pressure Monitor MISC For regular home bp monitoring during pregnancy 1 each 0   . butalbital-acetaminophen-caffeine (FIORICET) 50-325-40 MG tablet Take 1-2 tablets by mouth every 6 (six) hours as needed for headache. (Patient not taking: Reported on 10/04/2019) 20 tablet 0   . multivitamin (VIT W/EXTRA C) CHEW chewable tablet Chew 2 tablets by mouth daily.     Marland Kitchen scopolamine (TRANSDERM-SCOP) 1 MG/3DAYS Place 1 patch (1.5 mg total) onto the skin every 3 (three) days. (Patient not taking: Reported on 08/23/2019) 10 patch 0     Review of Systems  Constitutional: Negative.   Respiratory: Negative.   Cardiovascular: Negative.   Gastrointestinal: Positive for abdominal pain. Negative for constipation, diarrhea, nausea and vomiting.  Genitourinary: Positive for  vaginal discharge. Negative for difficulty urinating, dysuria, frequency and vaginal bleeding.  Musculoskeletal: Positive for back pain.  Neurological: Negative.   Psychiatric/Behavioral: Negative.    Physical Exam   Blood pressure (!) 101/56, pulse 85, temperature 98.2 F (36.8 C), resp. rate 16, weight 84 kg, last menstrual period 05/29/2019,  SpO2 100 %, not currently breastfeeding.  Physical Exam Vitals and nursing note reviewed. Exam conducted with a chaperone present.  HENT:     Head: Normocephalic.  Cardiovascular:     Rate and Rhythm: Normal rate and regular rhythm.  Pulmonary:     Effort: Pulmonary effort is normal. No respiratory distress.     Breath sounds: Normal breath sounds. No wheezing.  Abdominal:     Palpations: Abdomen is soft.     Tenderness: There is abdominal tenderness. There is right CVA tenderness and left CVA tenderness.     Comments: Gravid appropriate for gestational age, tenderness with palpation directly on spine and musculoskeletal   Genitourinary:    Exam position: Lithotomy position.     Vagina: Vaginal discharge present.     Comments: Pelvic exam: Cervix pink, visually closed, without lesion, moderate amount of white mucous discharge, vaginal walls and external genitalia normal Skin:    General: Skin is warm and dry.  Neurological:     Mental Status: She is alert and oriented to person, place, and time.  Psychiatric:        Mood and Affect: Mood normal.        Behavior: Behavior normal.        Thought Content: Thought content normal.    Dilation: Closed Effacement (%): Thick Cervical Position: Posterior Exam by:: V Marque Rademaker CNM  Fetal monitoring FHR 155 by doppler  Toco- UI, no UC   MAU Course  Procedures  MDM Most likely musculoskeletal pain with it being constant in nature. D/t hx of infections will r/o pyelo, UTI, and kidney stones   Orders Placed This Encounter  Procedures  . Wet prep, genital  . Culture, OB Urine  . US RENAL   . Urinalysis, Routine w reflex microscopic  . CBC with Differential/Platelet  . Comprehensive metabolic panel  . Discharge patient   Treatments in MAU included percocet 1 tablet prior to ultrasound so patient can tolerate  Labs and [name] report reviewed:  Results for orders placed or performed during the hospital encounter of 11/04/19 (from the past 24 hour(s))  Urinalysis, Routine w reflex microscopic     Status: Abnormal   Collection Time: 11/04/19 11:45 PM  Result Value Ref Range   Color, Urine YELLOW YELLOW   APPearance TURBID (A) CLEAR   Specific Gravity, Urine 1.023 1.005 - 1.030   pH 7.0 5.0 - 8.0   Glucose, UA NEGATIVE NEGATIVE mg/dL   Hgb urine dipstick NEGATIVE NEGATIVE   Bilirubin Urine NEGATIVE NEGATIVE   Ketones, ur NEGATIVE NEGATIVE mg/dL   Protein, ur 30 (A) NEGATIVE mg/dL   Nitrite NEGATIVE NEGATIVE   Leukocytes,Ua MODERATE (A) NEGATIVE   RBC / HPF 6-10 0 - 5 RBC/hpf   WBC, UA 11-20 0 - 5 WBC/hpf   Bacteria, UA RARE (A) NONE SEEN   Squamous Epithelial / LPF 11-20 0 - 5   Mucus PRESENT    Amorphous Crystal PRESENT   Wet prep, genital     Status: Abnormal   Collection Time: 11/05/19 12:04 AM   Specimen: PATH Cytology Cervicovaginal Ancillary Only  Result Value Ref Range   Yeast Wet Prep HPF POC PRESENT (A) NONE SEEN   Trich, Wet Prep NONE SEEN NONE SEEN   Clue Cells Wet Prep HPF POC PRESENT (A) NONE SEEN   WBC, Wet Prep HPF POC MANY (A) NONE SEEN   Sperm NONE SEEN   CBC with Differential/Platelet     Status: Abnormal   Collection Time:  11/05/19 12:29 AM  Result Value Ref Range   WBC 10.4 4.0 - 10.5 K/uL   RBC 3.72 (L) 3.87 - 5.11 MIL/uL   Hemoglobin 11.3 (L) 12.0 - 15.0 g/dL   HCT 33.2 (L) 36 - 46 %   MCV 89.2 80.0 - 100.0 fL   MCH 30.4 26.0 - 34.0 pg   MCHC 34.0 30.0 - 36.0 g/dL   RDW 14.3 11.5 - 15.5 %   Platelets 182 150 - 400 K/uL   nRBC 0.0 0.0 - 0.2 %   Neutrophils Relative % 63 %   Neutro Abs 6.7 1.7 - 7.7 K/uL   Lymphocytes Relative 23 %    Lymphs Abs 2.3 0.7 - 4.0 K/uL   Monocytes Relative 9 %   Monocytes Absolute 0.9 0 - 1 K/uL   Eosinophils Relative 3 %   Eosinophils Absolute 0.3 0 - 0 K/uL   Basophils Relative 1 %   Basophils Absolute 0.1 0 - 0 K/uL   Immature Granulocytes 1 %   Abs Immature Granulocytes 0.09 (H) 0.00 - 0.07 K/uL  Comprehensive metabolic panel     Status: Abnormal   Collection Time: 11/05/19 12:29 AM  Result Value Ref Range   Sodium 136 135 - 145 mmol/L   Potassium 3.8 3.5 - 5.1 mmol/L   Chloride 106 98 - 111 mmol/L   CO2 22 22 - 32 mmol/L   Glucose, Bld 105 (H) 70 - 99 mg/dL   BUN 8 6 - 20 mg/dL   Creatinine, Ser 0.45 0.44 - 1.00 mg/dL   Calcium 8.9 8.9 - 10.3 mg/dL   Total Protein 6.0 (L) 6.5 - 8.1 g/dL   Albumin 2.8 (L) 3.5 - 5.0 g/dL   AST 14 (L) 15 - 41 U/L   ALT 10 0 - 44 U/L   Alkaline Phosphatase 85 38 - 126 U/L   Total Bilirubin 0.7 0.3 - 1.2 mg/dL   GFR calc non Af Amer >60 >60 mL/min   GFR calc Af Amer >60 >60 mL/min   Anion gap 8 5 - 15   US RENAL  Result Date: 11/05/2019 CLINICAL DATA:  Acute bilateral intermittent flank pain EXAM: RENAL / URINARY TRACT ULTRASOUND COMPLETE COMPARISON:  04/10/2019 FINDINGS: Right Kidney: Renal measurements: 11.3 x 4.5 x 5.7 cm = volume: 153 mL . Echogenicity within normal limits. No mass or hydronephrosis visualized. Left Kidney: Renal measurements: 12.3 x 4.4 x 4.5 cm = volume: 128 mL. Echogenicity within normal limits. No mass or hydronephrosis visualized. Bladder: Appears normal for degree of bladder distention. Other: Gravid uterus is identified, with intrauterine pregnancy in cephalic presentation. IMPRESSION: 1. Unremarkable renal ultrasound. Electronically Signed   By: Randa Ngo M.D.   On: 11/05/2019 01:21   Discussed results of labs and [name] with patient. No signs of infection or stone. Pain is musculoskeletal. Patient continues to rate pain 9/10 after medication. 1 dose of dilaudid given to patient prior to discharge home.   Discussed use  of heat, maternity support belt and possible need for PT again this pregnancy. Rx for maternity support belt given to patient. Rx for terazol for yeast infection sent to pharmacy of choice.   After dilaudid given, patient had reaction including flushed, dizziness, vomiting- patient treated with hydration, zofran and saltines. After 20 minutes patient felt better.  Discussed reasons to return to MAU. Follow up as scheduled in the office. Return to MAU as needed. Pt stable at time of discharge.   Meds ordered this encounter  Medications  .  oxyCODONE-acetaminophen (PERCOCET/ROXICET) 5-325 MG per tablet 1 tablet  . HYDROmorphone (DILAUDID) injection 1 mg  . terconazole (TERAZOL 7) 0.4 % vaginal cream    Sig: Place 1 applicator vaginally at bedtime. Use for seven days    Dispense:  45 g    Refill:  0    Order Specific Question:   Supervising Provider    Answer:   Jonnie Kind [[id number]]  . Elastic Bandages & Supports (COMFORT FIT MATERNITY SUPP LG) MISC    Sig: 1 Units by Does not apply route daily.    Dispense:  1 each    Refill:  0    Order Specific Question:   Supervising Provider    Answer:   Jonnie Kind [[id number]]  . ondansetron (ZOFRAN-ODT) disintegrating tablet 8 mg    Assessment and Plan   1. Chronic bilateral low back pain without sciatica   2. Acute left flank pain   3. Acute right flank pain   4. [redacted] weeks gestation of pregnancy   5. Vulvovaginal candidiasis    Discharge home Follow up as scheduled in the office for prenatal care Return to MAU as needed for reasons discussed and/or emergencies  Rx for maternity support belt and terazol    Follow-up Information    [facility] Follow up.   Specialty: Obstetrics and Gynecology Why: Follow up as scheduled for prenatal care Contact information: [address] [place] [phone]             Allergies as of 11/05/2019      Reactions   Dilaudid [hydromorphone] Other  (See Comments)   Vomiting, rib pain, dizzy, flushed, sweating   Keflex [cephalexin] Other (See Comments)   Reaction:  Elevated liver enzymes    Other Other (See Comments)   Pt is allergic to surgical glue swelling;  Cats-congestion, sneezing, eyes watery, itching all over Reaction:  Infection    Adhesive [tape] Rash      Medication List    TAKE these medications   acetaminophen 500 MG tablet Commonly known as: TYLENOL Take 500 mg by mouth as needed.   Blood Pressure Monitor Misc For regular home bp monitoring during pregnancy   butalbital-acetaminophen-caffeine 50-325-40 MG tablet Commonly known as: FIORICET Take 1-2 tablets by mouth every 6 (six) hours as needed for headache.   Comfort Fit Maternity Supp Lg Misc 1 Units by Does not apply route daily.   cyclobenzaprine 10 MG tablet Commonly known as: FLEXERIL Take 1 tablet (10 mg total) by mouth 3 (three) times daily as needed (back pain). May use 1/2 tablet for mild back pain.   metoCLOPramide 10 MG tablet Commonly known as: REGLAN Take 1 tablet (10 mg total) by mouth every 6 (six) hours as needed for nausea.   multivitamin Chew chewable tablet Chew 2 tablets by mouth daily.   multivitamin-prenatal 27-0.8 MG Tabs tablet Take 1 tablet by mouth daily at 12 noon.   pantoprazole 20 MG tablet Commonly known as: PROTONIX Take 1 tablet (20 mg total) by mouth 2 (two) times daily.   scopolamine 1 MG/3DAYS Commonly known as: TRANSDERM-SCOP Place 1 patch (1.5 mg total) onto the skin every 3 (three) days.   terconazole 0.4 % vaginal cream Commonly known as: TERAZOL 7 Place 1 applicator vaginally at bedtime. Use for seven days   venlafaxine XR 37.5 MG 24 hr capsule Commonly known as: Effexor XR Take 1 capsule (37.5 mg total) by mouth daily with breakfast.  Lajean Manes CNM 11/05/2019, 6:51 AM

## 2019-11-06 ENCOUNTER — Other Ambulatory Visit: Payer: Self-pay | Admitting: Advanced Practice Midwife

## 2019-11-06 LAB — NUSWAB VAGINITIS PLUS (VG+)
Atopobium vaginae: HIGH Score — AB
BVAB 2: HIGH Score — AB
Candida albicans, NAA: POSITIVE — AB
Candida glabrata, NAA: NEGATIVE
Chlamydia trachomatis, NAA: NEGATIVE
Megasphaera 1: HIGH Score — AB
Neisseria gonorrhoeae, NAA: NEGATIVE
Trich vag by NAA: NEGATIVE

## 2019-11-06 LAB — CULTURE, OB URINE

## 2019-11-06 MED ORDER — METRONIDAZOLE 500 MG PO TABS
500.0000 mg | ORAL_TABLET | Freq: Two times a day (BID) | ORAL | 0 refills | Status: DC
Start: 2019-11-06 — End: 2019-11-28

## 2019-11-06 NOTE — Progress Notes (Signed)
Flagyl for BV on nuswab

## 2019-11-27 ENCOUNTER — Encounter: Payer: Self-pay | Admitting: *Deleted

## 2019-11-28 ENCOUNTER — Ambulatory Visit (INDEPENDENT_AMBULATORY_CARE_PROVIDER_SITE_OTHER): Payer: Medicaid Other | Admitting: Obstetrics and Gynecology

## 2019-11-28 ENCOUNTER — Other Ambulatory Visit: Payer: Self-pay

## 2019-11-28 ENCOUNTER — Other Ambulatory Visit: Payer: Medicaid Other

## 2019-11-28 ENCOUNTER — Encounter: Payer: Self-pay | Admitting: Obstetrics and Gynecology

## 2019-11-28 VITALS — BP 131/61 | HR 101 | Wt 186.4 lb

## 2019-11-28 DIAGNOSIS — Z3480 Encounter for supervision of other normal pregnancy, unspecified trimester: Secondary | ICD-10-CM

## 2019-11-28 DIAGNOSIS — Z3A26 26 weeks gestation of pregnancy: Secondary | ICD-10-CM

## 2019-11-28 DIAGNOSIS — Z1389 Encounter for screening for other disorder: Secondary | ICD-10-CM

## 2019-11-28 DIAGNOSIS — Z23 Encounter for immunization: Secondary | ICD-10-CM | POA: Diagnosis not present

## 2019-11-28 DIAGNOSIS — Z131 Encounter for screening for diabetes mellitus: Secondary | ICD-10-CM

## 2019-11-28 DIAGNOSIS — Z3482 Encounter for supervision of other normal pregnancy, second trimester: Secondary | ICD-10-CM

## 2019-11-28 DIAGNOSIS — Z331 Pregnant state, incidental: Secondary | ICD-10-CM

## 2019-11-28 LAB — POCT URINALYSIS DIPSTICK OB
Blood, UA: NEGATIVE
Glucose, UA: NEGATIVE
Ketones, UA: NEGATIVE
Leukocytes, UA: NEGATIVE
Nitrite, UA: NEGATIVE
POC,PROTEIN,UA: NEGATIVE

## 2019-11-28 NOTE — Progress Notes (Signed)
Patient ID: Stacey Cook, female   DOB: 1996-02-27, 24 y.o.   MRN: [id number]   LOW-RISK PREGNANCY VISIT Patient name: Stacey Cook MRN [id number]  Date of birth: 1996/01/09 Chief Complaint:   Routine Prenatal Visit (PN2 today; rash on palms of hands; itching on stomach; nose bleeds)  History of Present Illness:   Stacey Cook is a 24 y.o. [id number] female at [redacted]w[redacted]d with an Estimated Date of Delivery: 03/04/20 being seen today for ongoing management of a low-risk pregnancy.  Today she reports no complaints.   She is accompanied by her sister in law, [place]. She has a two year old and a 22 year old at home. This pregnancy was a surprise and she would like to get her tubes tied after delivery. She has gotten pregnant 3 times while on BCPs and had a miscarriage in December 2020. Her husband has three other kids from his first marriage, but is not able to see them because "it's complicated."  Contractions: Irregular. Vag. Bleeding: None.  Movement: Present. denies leaking of fluid. Review of Systems:   Pertinent items are noted in HPI Denies abnormal vaginal discharge w/ itching/odor/irritation, headaches, visual changes, shortness of breath, chest pain, abdominal pain, severe nausea/vomiting, or problems with urination or bowel movements unless otherwise stated above. Pertinent History Reviewed:  Reviewed past medical,surgical, social, obstetrical and family history.  Reviewed problem list, medications and allergies. Physical Assessment:   Vitals:   11/28/19 0946  BP: 131/61  Pulse: (!) 101  Weight: 186 lb 6.4 oz (84.6 kg)  Body mass index is 37.65 kg/m.        Physical Examination:   General appearance: Well appearing, and in no distress  Mental status: Alert, oriented to person, place, and time  Skin: Warm & dry  Cardiovascular: Normal heart rate noted  Respiratory: Normal respiratory effort, no distress  Abdomen: Soft, gravid, nontender  Pelvic: Cervical exam deferred          Extremities: Edema: Trace  Fetal Status: Fetal Heart Rate (bpm): 147   Movement: Present    Results for orders placed or performed in visit on 11/28/19 (from the past 24 hour(s))  POC Urinalysis Dipstick OB   Collection Time: 11/28/19  9:47 AM  Result Value Ref Range   Color, UA     Clarity, UA     Glucose, UA Negative Negative   Bilirubin, UA     Ketones, UA neg    Spec Grav, UA     Blood, UA neg    pH, UA     POC,PROTEIN,UA Negative Negative, Trace, Small (1+), Moderate (2+), Large (3+), 4+   Urobilinogen, UA     Nitrite, UA neg    Leukocytes, UA Negative Negative   Appearance     Odor       Discussion: Discussed with pt risks and benefits of BTL. The patient has become pregnant three times while on birth control pills. She is afraid of shots so she is not interested in Depo. She also does not want an IUD. Discussed that some women decide to wait to get their tubes tied due to the risk of crib death. At end of discussion, pt had opportunity to ask questions and has no further questions at this time. She would like to get an IUD inserted while at the hospital until she makes a final decision.re: tubal ligation  Specific discussion of BTL as noted above. Greater than 50% was spent in counseling and coordination of care with  the patient.   Total time greater than: 60minutes.    Assessment & Plan:  1) Low-risk pregnancy [id number] at [redacted]w[redacted]d with an Estimated Date of Delivery: 03/04/20   2) Wants IUD placed at delivery and will make a decision about BTL afterwards   Plan:  Continue routine obstetrical care  Meds: No orders of the defined types were placed in this encounter.  Labs/procedures today: PN2  Follow-up: Return in about 4 weeks (around 12/26/2019) for LROB.  By signing my name below, I, De Burrs, attest that this documentation has been prepared under the direction and in the presence of Jonnie Kind, MD. Electronically Signed: De Burrs, Medical Scribe.  11/28/19. 10:23 AM.  I personally performed the services described in this documentation, which was SCRIBED in my presence. The recorded information has been reviewed and considered accurate. It has been edited as necessary during review. Jonnie Kind, MD

## 2019-11-29 LAB — CBC
Hematocrit: 34.9 % (ref 34.0–46.6)
Hemoglobin: 11.5 g/dL (ref 11.1–15.9)
MCH: 28.9 pg (ref 26.6–33.0)
MCHC: 33 g/dL (ref 31.5–35.7)
MCV: 88 fL (ref 79–97)
Platelets: 153 10*3/uL (ref 150–450)
RBC: 3.98 x10E6/uL (ref 3.77–5.28)
RDW: 13.3 % (ref 11.7–15.4)
WBC: 8.6 10*3/uL (ref 3.4–10.8)

## 2019-11-29 LAB — HIV ANTIBODY (ROUTINE TESTING W REFLEX): HIV Screen 4th Generation wRfx: NONREACTIVE

## 2019-11-29 LAB — ANTIBODY SCREEN: Antibody Screen: NEGATIVE

## 2019-11-29 LAB — RPR: RPR Ser Ql: NONREACTIVE

## 2019-11-30 LAB — BILE ACIDS, TOTAL: Bile Acids Total: 2.9 umol/L (ref 0.0–10.0)

## 2019-12-03 ENCOUNTER — Other Ambulatory Visit: Payer: Medicaid Other

## 2019-12-04 ENCOUNTER — Ambulatory Visit (INDEPENDENT_AMBULATORY_CARE_PROVIDER_SITE_OTHER): Payer: Medicaid Other | Admitting: *Deleted

## 2019-12-04 VITALS — BP 117/69 | HR 96

## 2019-12-04 DIAGNOSIS — O219 Vomiting of pregnancy, unspecified: Secondary | ICD-10-CM

## 2019-12-04 DIAGNOSIS — Z3480 Encounter for supervision of other normal pregnancy, unspecified trimester: Secondary | ICD-10-CM

## 2019-12-04 DIAGNOSIS — Z331 Pregnant state, incidental: Secondary | ICD-10-CM

## 2019-12-04 DIAGNOSIS — O368120 Decreased fetal movements, second trimester, not applicable or unspecified: Secondary | ICD-10-CM | POA: Diagnosis not present

## 2019-12-04 DIAGNOSIS — Z3A27 27 weeks gestation of pregnancy: Secondary | ICD-10-CM | POA: Diagnosis not present

## 2019-12-04 DIAGNOSIS — O26892 Other specified pregnancy related conditions, second trimester: Secondary | ICD-10-CM

## 2019-12-04 DIAGNOSIS — Z1389 Encounter for screening for other disorder: Secondary | ICD-10-CM

## 2019-12-04 DIAGNOSIS — R110 Nausea: Secondary | ICD-10-CM | POA: Diagnosis not present

## 2019-12-04 LAB — POCT URINALYSIS DIPSTICK OB
Blood, UA: NEGATIVE
Glucose, UA: NEGATIVE
Ketones, UA: NEGATIVE
Leukocytes, UA: NEGATIVE
Nitrite, UA: NEGATIVE

## 2019-12-04 MED ORDER — ONDANSETRON 8 MG PO TBDP
8.0000 mg | ORAL_TABLET | Freq: Three times a day (TID) | ORAL | 1 refills | Status: DC | PRN
Start: 2019-12-04 — End: 2020-01-10

## 2019-12-04 NOTE — Addendum Note (Signed)
Addended by: [name] on: 12/04/2019 04:52 PM   Modules accepted: Orders

## 2019-12-04 NOTE — Progress Notes (Addendum)
   NURSE VISIT- Fetal Heart Rate Check  SUBJECTIVE:  Stacey Cook is a 24 y.o. [phone] female at [redacted]w[redacted]d, here for a fetal heart rate check for decreased fetal movement this am along with nausea/vomiting.  States Phenergan is not helping.   OBJECTIVE:  LMP 05/29/2019   BP 117/69 P 96 Appears well, slight abdominal tenderness   FHR 137-145 via doppler   ASSESSMENT: [id number] at [redacted]w[redacted]d with present fetal heart rate  PLAN: Discussed with Dr Elonda Husky.  Will send in different nausea medication.  Encouraged to push fluids and rest.  Follow-up as scheduled  Alice Rieger  12/04/2019 12:36 PM   Meds ordered this encounter  Medications  . ondansetron (ZOFRAN ODT) 8 MG disintegrating tablet    Sig: Take 1 tablet (8 mg total) by mouth every 8 (eight) hours as needed for nausea or vomiting.    Dispense:  20 tablet    Refill:  1     Attestation of Attending Supervision of Nursing Visit Encounter: Evaluation and management procedures were performed by the nursing staff under my supervision and collaboration.  I have reviewed the nurse's note and chart, and I agree with the management and plan.  Jacelyn Grip MD Attending Physician for the Center for [facility] 12/04/2019 3:12 PM

## 2019-12-07 LAB — GLUCOSE TOLERANCE, 2 HOURS W/ 1HR
Glucose, 1 hour: 145 mg/dL (ref 65–179)
Glucose, 2 hour: 79 mg/dL (ref 65–152)
Glucose, Fasting: 91 mg/dL (ref 65–91)

## 2019-12-13 ENCOUNTER — Other Ambulatory Visit: Payer: Self-pay

## 2019-12-13 ENCOUNTER — Inpatient Hospital Stay (HOSPITAL_COMMUNITY)
Admission: AD | Admit: 2019-12-13 | Discharge: 2019-12-13 | Disposition: A | Discharge status: Home / Self Care | Payer: Medicaid Other | Attending: Obstetrics & Gynecology | Admitting: Obstetrics & Gynecology

## 2019-12-13 ENCOUNTER — Encounter (HOSPITAL_COMMUNITY): Payer: Self-pay | Admitting: Obstetrics & Gynecology

## 2019-12-13 DIAGNOSIS — O99333 Smoking (tobacco) complicating pregnancy, third trimester: Secondary | ICD-10-CM | POA: Diagnosis not present

## 2019-12-13 DIAGNOSIS — O26893 Other specified pregnancy related conditions, third trimester: Secondary | ICD-10-CM | POA: Diagnosis not present

## 2019-12-13 DIAGNOSIS — Z3A28 28 weeks gestation of pregnancy: Secondary | ICD-10-CM | POA: Insufficient documentation

## 2019-12-13 DIAGNOSIS — Z885 Allergy status to narcotic agent status: Secondary | ICD-10-CM | POA: Insufficient documentation

## 2019-12-13 DIAGNOSIS — O99343 Other mental disorders complicating pregnancy, third trimester: Secondary | ICD-10-CM | POA: Insufficient documentation

## 2019-12-13 DIAGNOSIS — F419 Anxiety disorder, unspecified: Secondary | ICD-10-CM | POA: Diagnosis not present

## 2019-12-13 DIAGNOSIS — Z881 Allergy status to other antibiotic agents status: Secondary | ICD-10-CM | POA: Diagnosis not present

## 2019-12-13 DIAGNOSIS — Z9049 Acquired absence of other specified parts of digestive tract: Secondary | ICD-10-CM | POA: Insufficient documentation

## 2019-12-13 DIAGNOSIS — N760 Acute vaginitis: Secondary | ICD-10-CM | POA: Diagnosis not present

## 2019-12-13 DIAGNOSIS — B9689 Other specified bacterial agents as the cause of diseases classified elsewhere: Secondary | ICD-10-CM | POA: Diagnosis not present

## 2019-12-13 DIAGNOSIS — R102 Pelvic and perineal pain: Secondary | ICD-10-CM | POA: Diagnosis not present

## 2019-12-13 DIAGNOSIS — F17210 Nicotine dependence, cigarettes, uncomplicated: Secondary | ICD-10-CM | POA: Diagnosis not present

## 2019-12-13 DIAGNOSIS — Z79899 Other long term (current) drug therapy: Secondary | ICD-10-CM | POA: Diagnosis not present

## 2019-12-13 DIAGNOSIS — Z3480 Encounter for supervision of other normal pregnancy, unspecified trimester: Secondary | ICD-10-CM

## 2019-12-13 LAB — URINALYSIS, ROUTINE W REFLEX MICROSCOPIC
Bilirubin Urine: NEGATIVE
Glucose, UA: NEGATIVE mg/dL
Hgb urine dipstick: NEGATIVE
Ketones, ur: NEGATIVE mg/dL
Leukocytes,Ua: NEGATIVE
Nitrite: NEGATIVE
Protein, ur: NEGATIVE mg/dL
Specific Gravity, Urine: 1.029 (ref 1.005–1.030)
pH: 5 (ref 5.0–8.0)

## 2019-12-13 LAB — WET PREP, GENITAL
Sperm: NONE SEEN
Trich, Wet Prep: NONE SEEN
Yeast Wet Prep HPF POC: NONE SEEN

## 2019-12-13 MED ORDER — METRONIDAZOLE 500 MG PO TABS
500.0000 mg | ORAL_TABLET | Freq: Two times a day (BID) | ORAL | 0 refills | Status: AC
Start: 2019-12-13 — End: 2019-12-20

## 2019-12-13 MED ORDER — ACETAMINOPHEN 500 MG PO TABS: 1000.0000 mg | ORAL_TABLET | Freq: Four times a day (QID) | ORAL | 0 refills | Status: DC | PRN

## 2019-12-13 NOTE — MAU Note (Signed)
Woke up this morning with extreme pressure in pelvis.  Has continued and is constant.

## 2019-12-13 NOTE — Discharge Instructions (Signed)
-  flagyl (metronidazole) 500 mg twice daily x7 days, ensure you take with food and complete entire course  Bacterial Vaginosis  Bacterial vaginosis is an infection of the vagina. It happens when too many normal germs (healthy bacteria) grow in the vagina. This infection puts you at risk for infections from sex (STIs). Treating this infection can lower your risk for some STIs. You should also treat this if you are pregnant. It can cause your baby to be born early. Follow these instructions at home: Medicines  Take over-the-counter and prescription medicines only as told by your doctor.  Take or use your antibiotic medicine as told by your doctor. Do not stop taking or using it even if you start to feel better. General instructions  If you your sexual partner is a woman, tell her that you have this infection. She needs to get treatment if she has symptoms. If you have a female partner, he does not need to be treated.  During treatment: ? Avoid sex. ? Do not douche. ? Avoid alcohol as told. ? Avoid breastfeeding as told.  Drink enough fluid to keep your pee (urine) clear or pale yellow.  Keep your vagina and butt (rectum) clean. ? Wash the area with warm water every day. ? Wipe from front to back after you use the toilet.  Keep all follow-up visits as told by your doctor. This is important. Preventing this condition  Do not douche.  Use only warm water to wash around your vagina.  Use protection when you have sex. This includes: ? Latex condoms. ? Dental dams.  Limit how many people you have sex with. It is best to only have sex with the same person (be monogamous).  Get tested for STIs. Have your partner get tested.  Wear underwear that is cotton or lined with cotton.  Avoid tight pants and pantyhose. This is most important in summer.  Do not use any products that have nicotine or tobacco in them. These include cigarettes and e-cigarettes. If you need help quitting, ask your  doctor.  Do not use illegal drugs.  Limit how much alcohol you drink. Contact a doctor if:  Your symptoms do not get better, even after you are treated.  You have more discharge or pain when you pee (urinate).  You have a fever.  You have pain in your belly (abdomen).  You have pain with sex.  Your bleed from your vagina between periods. Summary  This infection happens when too many germs (bacteria) grow in the vagina.  Treating this condition can lower your risk for some infections from sex (STIs).  You should also treat this if you are pregnant. It can cause early (premature) birth.  Do not stop taking or using your antibiotic medicine even if you start to feel better. This information is not intended to replace advice given to you by your health care provider. Make sure you discuss any questions you have with your health care provider. Document Revised: 04/08/2017 Document Reviewed: 01/10/2016 Elsevier Patient Education  2020 [employer].

## 2019-12-13 NOTE — MAU Note (Signed)
PT SAYS SHE HAS PRESSURE - STARTED AT 830 AM-  SHE TOOK FLEXERIL LAST NIGHT .  PRESSURE HAS NEVER HAPPENED BEFORE . [facility]- SHE MESSAGED THEM - TOLD TO COME IN .  HAS BEEN ALL DAY- BECAME WORSE AT 130PM. .  LAST SEX- Tuesday

## 2019-12-13 NOTE — MAU Provider Note (Signed)
History     [id number]  Arrival date and time: 12/13/19 1843    Chief Complaint  Patient presents with   Pelvic Pain     HPI Stacey Cook is a 24 y.o. at [redacted]w[redacted]d by LMP c/w 8 wk u/s with extensive PMHx (refer to problem list), who presents for pelvic pressure that began approx 12 hours ago. Has tried pregnancy ball without relief. Has not taken any meds for this issue. No contractions, no LOF, no VB. No abnormal vaginal discharge or odor. No n/v/d/c. No fever/chills. No history of preterm delivery in the past.   Review of progress notes, labs, and imaging.  A/Positive/-- (04/15 1400)  OB History    Gravida  5   Para  2   Term  2   Preterm  0   AB  2   Living  2     SAB  2   TAB  0   Ectopic  0   Multiple  0   Live Births  2           Past Medical History:  Diagnosis Date   Abdominal pain    Anemia    Anginal pain (HCC)    Anxiety    Arthritis    knees   Asthma    Cervicalgia    Cholestasis during pregnancy    Chronic abdominal pain    Complication of anesthesia    light headed   Constipation    Ear mass    Gestational diabetes    pt states not been checking sugars regularly at home; nor has she been taking her Metformin   Gestational diabetes    Headache    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   TYMPANOPLASTY Left    TYMPANOSTOMY      Family History  Problem Relation Age of Onset   Cholelithiasis Mother    Kidney disease Mother        stones   Depression Mother    Hypertension Maternal Grandmother    Diabetes Maternal Grandmother    Stroke Maternal Grandmother    Seizures Maternal Grandmother    Asthma Maternal Grandmother    Hyperlipidemia Maternal Grandmother    Thyroid disease Maternal Grandmother    Asthma Brother    Seizures Maternal  Uncle    Cancer Other        breast- great aunt   Celiac disease Neg Hx     Social History   Socioeconomic History   Marital status: Married    Spouse name: Franco Collet   Number of children: 2   Years of education: Not on file   Highest education level: Not on file  Occupational History   Not on file  Tobacco Use   Smoking status: Current Every Day Smoker    Packs/day: 0.25    Years: 1.00    Pack years: 0.25    Types: Cigarettes    Last attempt to quit: 03/11/2017    Years since quitting: 2.7   Smokeless tobacco: Never Used   Tobacco comment: "3-4 cigs a day "  Vaping Use   Vaping Use: Former  Substance and Sexual Activity   Alcohol use: No    Alcohol/week: 0.0 standard drinks   Drug use: No   Sexual activity: Yes    Birth control/protection: None  Other Topics Concern   Not on file  Social History Narrative   [employer]   Is smoker herself            Social Determinants of Radio broadcast assistant Strain: Low Risk    Difficulty of Paying  Living Expenses: Not hard at all  Food Insecurity: No Food Insecurity   Worried About Charity fundraiser in the Last Year: Never true   Arboriculturist in the Last Year: Never true  Transportation Needs: No Transportation Needs   Lack of Transportation (Medical): No   Lack of Transportation (Non-Medical): No  Physical Activity: Sufficiently Active   Days of Exercise per Week: 7 days   Minutes of Exercise per Session: 100 min  Stress: Stress Concern Present   Feeling of Stress : To some extent  Social Connections: Moderately Integrated   Frequency of Communication with Friends and Family: More than three times a week   Frequency of Social Gatherings with Friends and Family: More than three times a week   Attends Religious Services: More than 4 times per year   Active Member of [employer] or Organizations: No   Attends Music therapist: Never   Marital Status: Married  Human resources officer Violence: Not At Risk   Fear of Current or Ex-Partner: No   Emotionally Abused: No   Physically Abused: No   Sexually Abused: No    Allergies  Allergen Reactions   Dilaudid [Hydromorphone] Other (See Comments)    Vomiting, rib pain, dizzy, flushed, sweating   Keflex [Cephalexin] Other (See Comments)    Reaction:  Elevated liver enzymes    Other Other (See Comments)    Pt is allergic to surgical glue swelling;  Cats-congestion, sneezing, eyes watery, itching all over Reaction:  Infection    Adhesive [Tape] Rash    No current facility-administered medications on file prior to encounter.   Current Outpatient Medications on File Prior to Encounter  Medication Sig Dispense Refill   acetaminophen (TYLENOL) 500 MG tablet Take 500 mg by mouth as needed.     cyclobenzaprine (FLEXERIL) 10 MG tablet Take 1 tablet (10 mg total) by mouth 3 (three) times daily as needed (back pain). May use 1/2 tablet for mild back pain. 20 tablet 0   metoCLOPramide (REGLAN) 10 MG tablet  Take 1 tablet (10 mg total) by mouth every 6 (six) hours as needed for nausea. 30 tablet 0  ondansetron (ZOFRAN ODT) 8 MG disintegrating tablet Take 1 tablet (8 mg total) by mouth every 8 (eight) hours as needed for nausea or vomiting. 20 tablet 1   pantoprazole (PROTONIX) 20 MG tablet Take 1 tablet (20 mg total) by mouth 2 (two) times daily. 30 tablet 6   Pediatric Multiple Vit-C-FA (FLINSTONES GUMMIES OMEGA-3 DHA PO) Take by mouth. Takes 2 daily     venlafaxine XR (EFFEXOR XR) 37.5 MG 24 hr capsule Take 1 capsule (37.5 mg total) by mouth daily with breakfast. 30 capsule 6   Blood Pressure Monitor MISC For regular home bp monitoring during pregnancy (Patient not taking: Reported on 12/04/2019) 1 each 0   Elastic Bandages & Supports (COMFORT FIT MATERNITY SUPP LG) MISC 1 Units by Does not apply route daily. (Patient not taking: Reported on 11/28/2019) 1 each 0     ROS Pertinent positives and negative per HPI, all others reviewed and negative  Physical Exam   BP 119/66 (BP Location: Right Arm)    Pulse (!) 103    Temp 98.6 F (37 C) (Oral)    Resp 18    Ht 4\' 11"  (1.499 m)    Wt 86.1 kg    LMP 05/29/2019    SpO2 100%    BMI 38.36 kg/m   Physical Exam Vitals and nursing note reviewed. Exam conducted with a chaperone present.  Constitutional:      General: She is not in acute distress.    Appearance: Normal appearance. She is normal weight.  HENT:     Head: Normocephalic and atraumatic.     Nose: Nose normal.     Mouth/Throat:     Mouth: Mucous membranes are moist.     Pharynx: Oropharynx is clear.  Eyes:     Extraocular Movements: Extraocular movements intact.     Conjunctiva/sclera: Conjunctivae normal.  Cardiovascular:     Rate and Rhythm: Normal rate.     Pulses: Normal pulses.  Pulmonary:     Effort: Pulmonary effort is normal.  Abdominal:     General: There is no distension.     Palpations: Abdomen is soft.     Tenderness: There is no abdominal tenderness. There is no  guarding or rebound.  Genitourinary:    General: Normal vulva.     Comments: Cervix closed. Normal appearing cervix. Copious white, watery discharge.  Musculoskeletal:        General: Normal range of motion.     Cervical back: Normal range of motion and neck supple.  Skin:    General: Skin is warm and dry.  Neurological:     General: No focal deficit present.     Mental Status: She is alert and oriented to person, place, and time. Mental status is at baseline.  Psychiatric:        Mood and Affect: Mood normal.        Behavior: Behavior normal.     Cervical Exam    Bedside Ultrasound Not indicated  My interpretation: n/a  FHT Baseline 150 bpm, moderate variability, +accels, no decels Toco: quiet Cat: 1  Labs Results for orders placed or performed during the hospital encounter of 12/13/19 (from the past 24 hour(s))  Urinalysis, Routine w reflex microscopic     Status: Abnormal   Collection Time: 12/13/19  7:17 PM  Result Value Ref Range   Color, Urine YELLOW YELLOW   APPearance HAZY (A) CLEAR   Specific Gravity, Urine 1.029 1.005 - 1.030   pH 5.0 5.0 -  8.0   Glucose, UA NEGATIVE NEGATIVE mg/dL   Hgb urine dipstick NEGATIVE NEGATIVE   Bilirubin Urine NEGATIVE NEGATIVE   Ketones, ur NEGATIVE NEGATIVE mg/dL   Protein, ur NEGATIVE NEGATIVE mg/dL   Nitrite NEGATIVE NEGATIVE   Leukocytes,Ua NEGATIVE NEGATIVE    Imaging No results found.  MAU Course  Procedures  Lab Orders     Urinalysis, Routine w reflex microscopic No orders of the defined types were placed in this encounter.  Imaging Orders  No imaging studies ordered today    MDM moderate  Assessment and Plan  23yo [id number] at [redacted]w[redacted]d presents to MAU for pelvic pressure.  #Bacterial vaginosis #Round ligament pain Patient presents with pelvic pressure for approx 12 hours, worsening. No associated abnormal vaginal discharge, odor, LOF. Denies contractions. VSS. Cervical exam which demonstrated copious  vaginal discharge. Wet prep with clue cells suggestive of bacterial vaginosis. NST reactive. Suspect additionally some associated round ligament pain. No contractions or cervical change to indicate labor. Patient given strict return precautions including VB, LOF, regular contractions. Flagyl sent to pharmacy.  Instructed to utilize tylenol and heating pad for her symptoms. Patient amendable to plan.  Dispo: discharge home  #FWB FHT Cat 1 NST: reactive  Roxanne Gates, MD OB Fellow, [place] [facility] [place] 12/13/2019 7:37 PM

## 2019-12-14 LAB — GC/CHLAMYDIA PROBE AMP (~~LOC~~) NOT AT ARMC
Chlamydia: NEGATIVE
Comment: NEGATIVE
Comment: NORMAL
Neisseria Gonorrhea: NEGATIVE

## 2019-12-19 ENCOUNTER — Other Ambulatory Visit: Payer: Self-pay | Admitting: Adult Health

## 2019-12-19 MED ORDER — CYCLOBENZAPRINE HCL 10 MG PO TABS: 10.0000 mg | ORAL_TABLET | Freq: Three times a day (TID) | ORAL | 0 refills | Status: DC | PRN

## 2019-12-19 NOTE — Progress Notes (Signed)
Will refill flexeril for spasms

## 2019-12-21 ENCOUNTER — Inpatient Hospital Stay (HOSPITAL_COMMUNITY): Payer: Medicaid Other

## 2019-12-21 ENCOUNTER — Inpatient Hospital Stay (HOSPITAL_COMMUNITY)
Admission: AD | Admit: 2019-12-21 | Discharge: 2019-12-22 | Disposition: A | Discharge status: Home / Self Care | Payer: Medicaid Other | Attending: Obstetrics and Gynecology | Admitting: Obstetrics and Gynecology

## 2019-12-21 ENCOUNTER — Other Ambulatory Visit: Payer: Self-pay

## 2019-12-21 ENCOUNTER — Encounter (HOSPITAL_COMMUNITY): Payer: Self-pay | Admitting: Obstetrics and Gynecology

## 2019-12-21 DIAGNOSIS — M545 Low back pain: Secondary | ICD-10-CM | POA: Insufficient documentation

## 2019-12-21 DIAGNOSIS — M549 Dorsalgia, unspecified: Secondary | ICD-10-CM | POA: Diagnosis not present

## 2019-12-21 DIAGNOSIS — Z3689 Encounter for other specified antenatal screening: Secondary | ICD-10-CM

## 2019-12-21 DIAGNOSIS — R109 Unspecified abdominal pain: Secondary | ICD-10-CM | POA: Diagnosis not present

## 2019-12-21 DIAGNOSIS — O368130 Decreased fetal movements, third trimester, not applicable or unspecified: Secondary | ICD-10-CM | POA: Diagnosis not present

## 2019-12-21 DIAGNOSIS — O99513 Diseases of the respiratory system complicating pregnancy, third trimester: Secondary | ICD-10-CM | POA: Insufficient documentation

## 2019-12-21 DIAGNOSIS — J45909 Unspecified asthma, uncomplicated: Secondary | ICD-10-CM | POA: Diagnosis not present

## 2019-12-21 DIAGNOSIS — Z79899 Other long term (current) drug therapy: Secondary | ICD-10-CM | POA: Insufficient documentation

## 2019-12-21 DIAGNOSIS — O99343 Other mental disorders complicating pregnancy, third trimester: Secondary | ICD-10-CM | POA: Insufficient documentation

## 2019-12-21 DIAGNOSIS — O99891 Other specified diseases and conditions complicating pregnancy: Secondary | ICD-10-CM

## 2019-12-21 DIAGNOSIS — O26893 Other specified pregnancy related conditions, third trimester: Secondary | ICD-10-CM | POA: Diagnosis not present

## 2019-12-21 DIAGNOSIS — Z3A29 29 weeks gestation of pregnancy: Secondary | ICD-10-CM | POA: Diagnosis not present

## 2019-12-21 DIAGNOSIS — F17210 Nicotine dependence, cigarettes, uncomplicated: Secondary | ICD-10-CM | POA: Diagnosis not present

## 2019-12-21 DIAGNOSIS — F419 Anxiety disorder, unspecified: Secondary | ICD-10-CM | POA: Diagnosis not present

## 2019-12-21 DIAGNOSIS — O99333 Smoking (tobacco) complicating pregnancy, third trimester: Secondary | ICD-10-CM | POA: Diagnosis not present

## 2019-12-21 LAB — URINALYSIS, ROUTINE W REFLEX MICROSCOPIC
Bilirubin Urine: NEGATIVE
Glucose, UA: NEGATIVE mg/dL
Hgb urine dipstick: NEGATIVE
Ketones, ur: NEGATIVE mg/dL
Nitrite: NEGATIVE
Protein, ur: 30 mg/dL — AB
Specific Gravity, Urine: 1.028 (ref 1.005–1.030)
pH: 6 (ref 5.0–8.0)

## 2019-12-21 LAB — CBC WITH DIFFERENTIAL/PLATELET
Abs Immature Granulocytes: 0.05 10*3/uL (ref 0.00–0.07)
Basophils Absolute: 0.1 10*3/uL (ref 0.0–0.1)
Basophils Relative: 1 %
Eosinophils Absolute: 0 10*3/uL (ref 0.0–0.5)
Eosinophils Relative: 0 %
HCT: 33.6 % — ABNORMAL LOW (ref 36.0–46.0)
Hemoglobin: 11 g/dL — ABNORMAL LOW (ref 12.0–15.0)
Immature Granulocytes: 0 %
Lymphocytes Relative: 15 %
Lymphs Abs: 1.8 10*3/uL (ref 0.7–4.0)
MCH: 29.2 pg (ref 26.0–34.0)
MCHC: 32.7 g/dL (ref 30.0–36.0)
MCV: 89.1 fL (ref 80.0–100.0)
Monocytes Absolute: 0.8 10*3/uL (ref 0.1–1.0)
Monocytes Relative: 7 %
Neutro Abs: 9 10*3/uL — ABNORMAL HIGH (ref 1.7–7.7)
Neutrophils Relative %: 77 %
Platelets: 156 10*3/uL (ref 150–400)
RBC: 3.77 MIL/uL — ABNORMAL LOW (ref 3.87–5.11)
RDW: 14 % (ref 11.5–15.5)
WBC: 11.7 10*3/uL — ABNORMAL HIGH (ref 4.0–10.5)
nRBC: 0 % (ref 0.0–0.2)

## 2019-12-21 LAB — COMPREHENSIVE METABOLIC PANEL
ALT: 15 U/L (ref 0–44)
AST: 17 U/L (ref 15–41)
Albumin: 2.8 g/dL — ABNORMAL LOW (ref 3.5–5.0)
Alkaline Phosphatase: 112 U/L (ref 38–126)
Anion gap: 10 (ref 5–15)
BUN: 6 mg/dL (ref 6–20)
CO2: 19 mmol/L — ABNORMAL LOW (ref 22–32)
Calcium: 9.1 mg/dL (ref 8.9–10.3)
Chloride: 106 mmol/L (ref 98–111)
Creatinine, Ser: 0.52 mg/dL (ref 0.44–1.00)
GFR calc Af Amer: >60 mL/min (ref >60)
GFR calc non Af Amer: >60 mL/min (ref >60)
Glucose, Bld: 98 mg/dL (ref 70–99)
Potassium: 3.7 mmol/L (ref 3.5–5.1)
Sodium: 135 mmol/L (ref 135–145)
Total Bilirubin: 0.4 mg/dL (ref 0.3–1.2)
Total Protein: 6.3 g/dL — ABNORMAL LOW (ref 6.5–8.1)

## 2019-12-21 LAB — AMYLASE: Amylase: 41 U/L (ref 28–100)

## 2019-12-21 LAB — LIPASE, BLOOD: Lipase: 30 U/L (ref 11–51)

## 2019-12-21 MED ORDER — MORPHINE SULFATE (PF) 4 MG/ML IV SOLN
4.0000 mg | Freq: Once | INTRAVENOUS | Status: AC
  Administered 2019-12-21: 4 mg via INTRAVENOUS
  Filled 2019-12-21: qty 1

## 2019-12-21 MED ORDER — LACTATED RINGERS IV BOLUS
1000.0000 mL | Freq: Once | INTRAVENOUS | Status: AC
  Administered 2019-12-21: 1000 mL via INTRAVENOUS

## 2019-12-21 MED ORDER — ONDANSETRON HCL 4 MG/2ML IJ SOLN
4.0000 mg | Freq: Once | INTRAMUSCULAR | Status: AC
  Administered 2019-12-21: 4 mg via INTRAVENOUS
  Filled 2019-12-21: qty 2

## 2019-12-21 NOTE — MAU Note (Signed)
Pt reports to MAU stating she is having a lot of pelvic pain that is radiating to her hips and back. Pt states its only on her right side. No bleeding or LOF. Pt reports NVD. Pt states the pain is a 8/10 constantly and states the call a nurse said she may have appendicitis.

## 2019-12-21 NOTE — MAU Provider Note (Signed)
History     CSN: [id number]  Arrival date and time: 12/21/19 2108   First Provider Initiated Contact with Patient 12/21/19 2140      Chief Complaint  Patient presents with  . Abdominal Pain  . Pelvic Pain  . Decreased Fetal Movement   Stacey Cook is a 24 y.o. [id number] at [redacted]w[redacted]d who receives care at [facility].  She presents today for Abdominal Pain, Pelvic Pain, and Decreased Fetal Movement.  She states she started having sharp abdominal pain that went from her umbilicus to her right side on her back.  Patient reports the pain intensifies every few minutes. Patient reports that she started having nausea and vomiting that has "been on and off for the past few days."  She also reports diarrhea at that time.  She reports the pain is worsened with laying on either side and is not improved with any known factors.  She states she was prescribed flexeril, but hasn't been able to obtain the $3 for the co-pay.  Patient states she does not take tylenol because her liver enzymes were up.   Gravy biscuit one hour prior to arrival Water, tea throughout the day. No other food d/t nausea and vomiting.    OB History    Gravida  5   Para  2   Term  2   Preterm  0   AB  2   Living  2     SAB  2   TAB  0   Ectopic  0   Multiple  0   Live Births  2           Past Medical History:  Diagnosis Date  . Abdominal pain   . Anemia   . Anginal pain ([place])   . Anxiety   . Arthritis    knees  . Asthma   . Cervicalgia   . Cholestasis during pregnancy   . Chronic abdominal pain   . Complication of anesthesia    light headed  . Constipation   . Ear mass   . Gestational diabetes    pt states not been checking sugars regularly at home; nor has she been taking her Metformin  . Gestational diabetes   . Headache   . 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 HSV infection   . Low iron   . Mental disorder   . PONV (postoperative nausea and vomiting)   . Suicidal intent    . UTI (lower urinary tract infection) 05/2014  . Vomiting     Past Surgical History:  Procedure Laterality Date  . CESAREAN SECTION N/A 01/03/2019   Procedure: CESAREAN SECTION;  Surgeon: [name], MD;  Location: [facility];  Service: Obstetrics;  Laterality: N/A;  . CHOLECYSTECTOMY N/A 01/16/2018   Procedure: LAPAROSCOPIC CHOLECYSTECTOMY;  Surgeon: Aviva Signs, MD;  Location: [facility];  Service: General;  Laterality: N/A;  per Dr. Arnoldo Morale, pt will most likely go home and he will tell her to arrive at 10:45 - already has labs  . CHOLESTEATOMA EXCISION    . COCHLEAR IMPLANT Right   . DILATION AND EVACUATION N/A 09/11/2016   Procedure: DILATATION AND CURRATAGE  2ND TRIMESTER;  Surgeon: Jonnie Kind, MD;  Location: [facility];  Service: Gynecology;  Laterality: N/A;  . IMPLANTATION BONE ANCHORED HEARING AID Right 04/2013  . INNER EAR SURGERY Right 03/31/2018  . 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  TYMPANOPLASTY Left   . TYMPANOSTOMY      Family History  Problem Relation Age of Onset  . Cholelithiasis Mother   . Kidney disease Mother        stones  . Depression Mother   . Hypertension Maternal Grandmother   . Diabetes Maternal Grandmother   . Stroke Maternal Grandmother   . Seizures Maternal Grandmother   . Asthma Maternal Grandmother   . Hyperlipidemia Maternal Grandmother   . Thyroid disease Maternal Grandmother   . Asthma Brother   . Seizures Maternal Uncle   . Cancer Other        breast- great aunt  . Celiac disease Neg Hx     Social History   Tobacco Use  . Smoking status: Current Every Day Smoker    Packs/day: 0.25    Years: 1.00    Pack years: 0.25    Types: Cigarettes    Last attempt to quit: 03/11/2017    Years since quitting: 2.7  . Smokeless tobacco: Never Used  . Tobacco comment: "3-4 cigs a day "  Vaping Use  . Vaping Use: Former  Substance Use Topics  . Alcohol use: No    Alcohol/week: 0.0 standard drinks  . Drug use: No     Allergies:  Allergies  Allergen Reactions  . Dilaudid [Hydromorphone] Other (See Comments)    Vomiting, rib pain, dizzy, flushed, sweating  . Keflex [Cephalexin] Other (See Comments)    Reaction:  Elevated liver enzymes   . Other Other (See Comments)    Pt is allergic to surgical glue swelling;  Cats-congestion, sneezing, eyes watery, itching all over Reaction:  Infection   . Adhesive [Tape] Rash    Medications Prior to Admission  Medication Sig Dispense Refill Last Dose  . metoCLOPramide (REGLAN) 10 MG tablet Take 1 tablet (10 mg total) by mouth every 6 (six) hours as needed for nausea. 30 tablet 0 Past Week at Unknown time  . ondansetron (ZOFRAN ODT) 8 MG disintegrating tablet Take 1 tablet (8 mg total) by mouth every 8 (eight) hours as needed for nausea or vomiting. 20 tablet 1 12/20/2019 at Unknown time  . pantoprazole (PROTONIX) 20 MG tablet Take 1 tablet (20 mg total) by mouth 2 (two) times daily. 30 tablet 6 Past Month at Unknown time  . Pediatric Multiple Vit-C-FA (FLINSTONES GUMMIES OMEGA-3 DHA PO) Take by mouth. Takes 2 daily   12/21/2019 at Unknown time  . venlafaxine XR (EFFEXOR XR) 37.5 MG 24 hr capsule Take 1 capsule (37.5 mg total) by mouth daily with breakfast. 30 capsule 6 12/21/2019 at Unknown time  . acetaminophen (TYLENOL) 500 MG tablet Take 2 tablets (1,000 mg total) by mouth every 6 (six) hours as needed for mild pain, moderate pain or headache. 30 tablet 0   . Blood Pressure Monitor MISC For regular home bp monitoring during pregnancy (Patient not taking: Reported on 12/04/2019) 1 each 0   . cyclobenzaprine (FLEXERIL) 10 MG tablet Take 1 tablet (10 mg total) by mouth 3 (three) times daily as needed (back pain). May use 1/2 tablet for mild back pain. 20 tablet 0   . Elastic Bandages & Supports (COMFORT FIT MATERNITY SUPP LG) MISC 1 Units by Does not apply route daily. (Patient not taking: Reported on 11/28/2019) 1 each 0     Review of Systems  Gastrointestinal:  Positive for abdominal pain, diarrhea, nausea and vomiting. Negative for constipation.  Genitourinary: Negative for difficulty urinating, dysuria, vaginal bleeding and vaginal discharge.  Musculoskeletal:  Positive for back pain.  Neurological: Negative for dizziness, light-headedness and headaches.   Physical Exam   Blood pressure 128/66, pulse 100, temperature 98.1 F (36.7 C), resp. rate 17, last menstrual period 05/29/2019, not currently breastfeeding.  Physical Exam Constitutional:      General: She is in acute distress.     Comments: Patient visually distraught (moaning) with reports of intermittent pain.   HENT:     Head: Normocephalic and atraumatic.  Cardiovascular:     Rate and Rhythm: Regular rhythm.     Heart sounds: Normal heart sounds.  Pulmonary:     Effort: Pulmonary effort is normal. No respiratory distress.     Breath sounds: Normal breath sounds.  Abdominal:     General: Bowel sounds are normal.     Palpations: Abdomen is soft.     Tenderness: There is abdominal tenderness in the right lower quadrant, periumbilical area and left upper quadrant. There is right CVA tenderness.     Comments: Gravid   Skin:    Coloration: Skin is pale.  Neurological:     Mental Status: She is alert and oriented to person, place, and time.  Psychiatric:        Mood and Affect: Mood normal.        Behavior: Behavior normal.     Fetal Assessment 145 bpm, Mod Var, -Decels, +15x15Accels Toco: No ctx graphed  MAU Course   Results for orders placed or performed during the hospital encounter of 12/21/19 (from the past 24 hour(s))  Urinalysis, Routine w reflex microscopic Urine, Clean Catch     Status: Abnormal   Collection Time: 12/21/19  9:29 PM  Result Value Ref Range   Color, Urine AMBER (A) YELLOW   APPearance CLOUDY (A) CLEAR   Specific Gravity, Urine 1.028 1.005 - 1.030   pH 6.0 5.0 - 8.0   Glucose, UA NEGATIVE NEGATIVE mg/dL   Hgb urine dipstick NEGATIVE NEGATIVE    Bilirubin Urine NEGATIVE NEGATIVE   Ketones, ur NEGATIVE NEGATIVE mg/dL   Protein, ur 30 (A) NEGATIVE mg/dL   Nitrite NEGATIVE NEGATIVE   Leukocytes,Ua LARGE (A) NEGATIVE   RBC / HPF 0-5 0 - 5 RBC/hpf   WBC, UA 21-50 0 - 5 WBC/hpf   Bacteria, UA RARE (A) NONE SEEN   Squamous Epithelial / LPF 11-20 0 - 5   Mucus PRESENT   CBC with Differential/Platelet     Status: Abnormal   Collection Time: 12/21/19  9:48 PM  Result Value Ref Range   WBC 11.7 (H) 4.0 - 10.5 K/uL   RBC 3.77 (L) 3.87 - 5.11 MIL/uL   Hemoglobin 11.0 (L) 12.0 - 15.0 g/dL   HCT 33.6 (L) 36 - 46 %   MCV 89.1 80.0 - 100.0 fL   MCH 29.2 26.0 - 34.0 pg   MCHC 32.7 30.0 - 36.0 g/dL   RDW 14.0 11.5 - 15.5 %   Platelets 156 150 - 400 K/uL   nRBC 0.0 0.0 - 0.2 %   Neutrophils Relative % 77 %   Neutro Abs 9.0 (H) 1.7 - 7.7 K/uL   Lymphocytes Relative 15 %   Lymphs Abs 1.8 0.7 - 4.0 K/uL   Monocytes Relative 7 %   Monocytes Absolute 0.8 0 - 1 K/uL   Eosinophils Relative 0 %   Eosinophils Absolute 0.0 0 - 0 K/uL   Basophils Relative 1 %   Basophils Absolute 0.1 0 - 0 K/uL   Immature Granulocytes 0 %   Abs  Immature Granulocytes 0.05 0.00 - 0.07 K/uL  Comprehensive metabolic panel     Status: Abnormal   Collection Time: 12/21/19  9:48 PM  Result Value Ref Range   Sodium 135 135 - 145 mmol/L   Potassium 3.7 3.5 - 5.1 mmol/L   Chloride 106 98 - 111 mmol/L   CO2 19 (L) 22 - 32 mmol/L   Glucose, Bld 98 70 - 99 mg/dL   BUN 6 6 - 20 mg/dL   Creatinine, Ser 0.52 0.44 - 1.00 mg/dL   Calcium 9.1 8.9 - 10.3 mg/dL   Total Protein 6.3 (L) 6.5 - 8.1 g/dL   Albumin 2.8 (L) 3.5 - 5.0 g/dL   AST 17 15 - 41 U/L   ALT 15 0 - 44 U/L   Alkaline Phosphatase 112 38 - 126 U/L   Total Bilirubin 0.4 0.3 - 1.2 mg/dL   GFR calc non Af Amer >60 >60 mL/min   GFR calc Af Amer >60 >60 mL/min   Anion gap 10 5 - 15  Amylase     Status: None   Collection Time: 12/21/19  9:48 PM  Result Value Ref Range   Amylase 41 28 - 100 U/L  Lipase, blood      Status: None   Collection Time: 12/21/19  9:48 PM  Result Value Ref Range   Lipase 30 11 - 51 U/L   MR PELVIS WO CONTRAST  Result Date: 12/22/2019 CLINICAL DATA:  Right lower quadrant abdominal pain, pregnant EXAM: MRI ABDOMEN AND PELVIS WITHOUT CONTRAST TECHNIQUE: Multiplanar multisequence MR imaging of the abdomen and pelvis was performed. No intravenous contrast was administered. COMPARISON:  CT 04/10/2019 FINDINGS: COMBINED FINDINGS FOR BOTH MR ABDOMEN AND PELVIS Lower chest: No acute findings. Hepatobiliary: Status post cholecystectomy. The liver is unremarkable. No focal intrahepatic masses are seen. There is no intra or extrahepatic biliary ductal dilation. Normal flow void within the portal vein and hepatic veins. Pancreas:  Unremarkable Spleen: Mild splenomegaly is unchanged from prior CT examination. No intra splenic masses are seen. The splenic vein is patent with appropriate flow void. Adrenals/Urinary Tract: Adrenal glands are unremarkable. The kidneys are normal in size and position. No intrarenal masses. No hydronephrosis. The bladder is unremarkable. Stomach/Bowel: The appendix is well visualized and is normal, best seen on coronal imaging, image 19/3 and image 19/6. the small and large bowel are unremarkable. No ascites. Vascular/Lymphatic: There is no pathologic adenopathy with abdomen and pelvis. There are appropriate flow voids identified within the renal veins, inferior vena cava, iliac veins, and gonadal veins. The abdominal aorta is nonaneurysmal. Reproductive: A single gestation is seen of relatively advanced gestational age. The fetus is in breech presentation. The placenta is seen along the fundal wall. Detailed fetal survey is deferred on this examination. Other:  The rectum is unremarkable. Musculoskeletal: Bone marrow signal intensity is normal. IMPRESSION: 1. Normal appendix. 2. No acute findings in the abdomen or pelvis. 3. Single gestation in breech presentation. Detailed  fetal survey is deferred on this examination. Electronically Signed   By: Fidela Salisbury MD   On: 12/22/2019 00:00   MR ABDOMEN WO CONTRAST  Result Date: 12/22/2019 CLINICAL DATA:  Right lower quadrant abdominal pain, pregnant EXAM: MRI ABDOMEN AND PELVIS WITHOUT CONTRAST TECHNIQUE: Multiplanar multisequence MR imaging of the abdomen and pelvis was performed. No intravenous contrast was administered. COMPARISON:  CT 04/10/2019 FINDINGS: COMBINED FINDINGS FOR BOTH MR ABDOMEN AND PELVIS Lower chest: No acute findings. Hepatobiliary: Status post cholecystectomy. The liver is unremarkable. No  focal intrahepatic masses are seen. There is no intra or extrahepatic biliary ductal dilation. Normal flow void within the portal vein and hepatic veins. Pancreas:  Unremarkable Spleen: Mild splenomegaly is unchanged from prior CT examination. No intra splenic masses are seen. The splenic vein is patent with appropriate flow void. Adrenals/Urinary Tract: Adrenal glands are unremarkable. The kidneys are normal in size and position. No intrarenal masses. No hydronephrosis. The bladder is unremarkable. Stomach/Bowel: The appendix is well visualized and is normal, best seen on coronal imaging, image 19/3 and image 19/6. the small and large bowel are unremarkable. No ascites. Vascular/Lymphatic: There is no pathologic adenopathy with abdomen and pelvis. There are appropriate flow voids identified within the renal veins, inferior vena cava, iliac veins, and gonadal veins. The abdominal aorta is nonaneurysmal. Reproductive: A single gestation is seen of relatively advanced gestational age. The fetus is in breech presentation. The placenta is seen along the fundal wall. Detailed fetal survey is deferred on this examination. Other:  The rectum is unremarkable. Musculoskeletal: Bone marrow signal intensity is normal. IMPRESSION: 1. Normal appendix. 2. No acute findings in the abdomen or pelvis. 3. Single gestation in breech presentation.  Detailed fetal survey is deferred on this examination. Electronically Signed   By: Fidela Salisbury MD   On: 12/22/2019 00:00    MDM PE Labs:CBC with Diff, CMP, Amylase, Lipase EFM Start IV Pain Medication Antiemetic Assessment and Plan  24 year old [id number]  SIUP at 29.3weeks Cat I FT Abdominal Pain Back Pain  -POC Reviewed -Exam performed and findings discussed. -Patient informed that clinical presentation and symptoms concerning for appendicitis. -Labs ordered. -Patient denies history of problems or allergies to morphine.  Will give 4mg  IV. -Zofran 4mg  IV for nausea. -MRI consulted and Myra advises that MR wo contrast of Abdomen and Pelvis should be ordered.  I appreciate her assistance. -Patient informed of impending scan and that if normal will send for renal US if appopriate. -Patient and sister-in-law without questions or concerns. -NST reactive -Will await results and reassess as appropriate.   Maryann Conners MSN, CNM 12/21/2019, 9:41 PM   Reassessment (12:05 AM)  -Nurse call reports patient back from MRI and c/o increased pain. -Provider to bedside and patient reports feeling of contraction pain. -VE performed and patient informed cervix closed. -No ctx palpated -Dr. [employer] consulted and patient status and results reviewed. Advises: *Monitor for 4 hours  *Morphine 2mg  Q 2 hrs for pain. *Continue IV hydration. *Perform UDS  Reassessment (2:07 AM) -Patient reports improvement in pain. States pain in middle back and radiates to left side now.  -States pain remains constant, but no "intense cramps." -Informed that some contractions graphed as well as irritability. -Discussed how BV could be contributing to this.  -Patient states she is trying to move to help with the pain. -Patient now states pain is 8/10.  -Will give warm compresses and reassess. -Patient instructed to notify nurse if back pain does not improve.  Reassessment (3:50 AM)  -Provider to  bedside and patient asleep, but awakened easily.  -Patient reports improvement in back pain significantly. -Discussed follow up as scheduled. -Encouraged to call or return to MAU if symptoms worsen or with the onset of new symptoms. -Discharged to home in improved condition.  Maryann Conners MSN, CNM Advanced Practice Provider, [facility]

## 2019-12-22 LAB — RAPID URINE DRUG SCREEN, HOSP PERFORMED
Amphetamines: NOT DETECTED
Barbiturates: NOT DETECTED
Benzodiazepines: NOT DETECTED
Cocaine: NOT DETECTED
Opiates: NOT DETECTED
Tetrahydrocannabinol: NOT DETECTED

## 2019-12-22 MED ORDER — MORPHINE SULFATE (PF) 4 MG/ML IV SOLN: 2.0000 mg | INTRAVENOUS | Status: DC | PRN

## 2019-12-22 NOTE — Discharge Instructions (Signed)
Back Pain in Pregnancy Back pain during pregnancy is common. Back pain may be caused by several factors that are related to changes during your pregnancy. Follow these instructions at home: Managing pain, stiffness, and swelling      If directed, for sudden (acute) back pain, put ice on the painful area. ? Put ice in a plastic bag. ? Place a towel between your skin and the bag. ? Leave the ice on for 20 minutes, 2-3 times per day.  If directed, apply heat to the affected area before you exercise. Use the heat source that your health care provider recommends, such as a moist heat pack or a heating pad. ? Place a towel between your skin and the heat source. ? Leave the heat on for 20-30 minutes. ? Remove the heat if your skin turns bright red. This is especially important if you are unable to feel pain, heat, or cold. You may have a greater risk of getting burned.  If directed, massage the affected area. Activity  Exercise as told by your health care provider. Gentle exercise is the best way to prevent or manage back pain.  Listen to your body when lifting. If lifting hurts, ask for help or bend your knees. This uses your leg muscles instead of your back muscles.  Squat down when picking up something from the floor. Do not bend over.  Only use bed rest for short periods as told by your health care provider. Bed rest should only be used for the most severe episodes of back pain. Standing, sitting, and lying down  Do not stand in one place for long periods of time.  Use good posture when sitting. Make sure your head rests over your shoulders and is not hanging forward. Use a pillow on your lower back if necessary.  Try sleeping on your side, preferably the left side, with a pregnancy support pillow or 1-2 regular pillows between your legs. ? If you have back pain after a night's rest, your bed may be too soft. ? A firm mattress may provide more support for your back during  pregnancy. General instructions  Do not wear high heels.  Eat a healthy diet. Try to gain weight within your health care provider's recommendations.  Use a maternity girdle, elastic sling, or back brace as told by your health care provider.  Take over-the-counter and prescription medicines only as told by your health care provider.  Work with a physical therapist or massage therapist to find ways to manage back pain. Acupuncture or massage therapy may be helpful.  Keep all follow-up visits as told by your health care provider. This is important. Contact a health care provider if:  Your back pain interferes with your daily activities.  You have increasing pain in other parts of your body. Get help right away if:  You develop numbness, tingling, weakness, or problems with the use of your arms or legs.  You develop severe back pain that is not controlled with medicine.  You have a change in bowel or bladder control.  You develop shortness of breath, dizziness, or you faint.  You develop nausea, vomiting, or sweating.  You have back pain that is a rhythmic, cramping pain similar to labor pains. Labor pain is usually 1-2 minutes apart, lasts for about 1 minute, and involves a bearing down feeling or pressure in your pelvis.  You have back pain and your water breaks or you have vaginal bleeding.  You have back pain or numbness  that travels down your leg. °· Your back pain developed after you fell. °· You develop pain on one side of your back. °· You see blood in your urine. °· You develop skin blisters in the area of your back pain. °Summary °· Back pain may be caused by several factors that are related to changes during your pregnancy. °· Follow instructions as told by your health care provider for managing pain, stiffness, and swelling. °· Exercise as told by your health care provider. Gentle exercise is the best way to prevent or manage back pain. °· Take over-the-counter and  prescription medicines only as told by your health care provider. °· Keep all follow-up visits as told by your health care provider. This is important. °This information is not intended to replace advice given to you by your health care provider. Make sure you discuss any questions you have with your health care provider. °Document Revised: 08/15/2018 Document Reviewed: 10/12/2017 °Elsevier Patient Education © 2020 [employer] ° °

## 2019-12-23 LAB — URINE CULTURE

## 2019-12-26 ENCOUNTER — Ambulatory Visit (INDEPENDENT_AMBULATORY_CARE_PROVIDER_SITE_OTHER): Payer: Medicaid Other | Admitting: Obstetrics and Gynecology

## 2019-12-26 ENCOUNTER — Other Ambulatory Visit: Payer: Self-pay

## 2019-12-26 VITALS — BP 129/73 | HR 112 | Wt 188.6 lb

## 2019-12-26 DIAGNOSIS — Z98891 History of uterine scar from previous surgery: Secondary | ICD-10-CM

## 2019-12-26 NOTE — Progress Notes (Signed)
PATIENT ID: Stacey Cook, female     DOB: [birth year], 24 y.o.     MRN: [id number]    LOW-RISK PREGNANCY VISIT PATIENT NAME: Stacey Cook MRN [id number]  DOB: [birth year]  Chief Complaint:   Routine Prenatal Visit   History of Present Illness:   Stacey Cook is a 24 y.o. [id number] female at [redacted]w[redacted]d prior c/s x 1, considering TOLAC with an Estimated Date of Delivery: 03/04/20 being seen today for ongoing management of a otherwise low-risk pregnancy.  She was seen in the MAU on 12/21/2019 for pelvic pain that radiates to her hips and back on her right side, with an 8/10 pain scale.   Depression screen [facility] 2/9 11/28/2019 08/23/2019 06/06/2018 04/28/2017  Decreased Interest 2 1 2 3   Down, Depressed, Hopeless 1 1 3 1   PHQ - 2 Score 3 2 5 4   Altered sleeping 3 3 3 3   Tired, decreased energy 3 3 3 3   Change in appetite 1 2 2 2   Feeling bad or failure about yourself  1 1 1  0  Trouble concentrating 2 1 1  0  Moving slowly or fidgety/restless 0 0 1 0  Suicidal thoughts 0 0 1 0  PHQ-9 Score 13 12 17 12   Difficult doing work/chores Somewhat difficult Somewhat difficult - Very difficult  Some recent data might be hidden    Today she reports no complaints. Contractions: Irregular. Vag. Bleeding: None.  Movement: Present. denies leaking of fluid.  Review of Systems:   Pertinent items are noted in HPI Denies abnormal vaginal discharge w/ itching/odor/irritation, headaches, visual changes, shortness of breath, chest pain, abdominal pain, severe nausea/vomiting, or problems with urination or bowel movements unless otherwise stated above.  Pertinent History Reviewed:  Reviewed past medical,surgical, social, obstetrical and family history.  Reviewed problem list, medications and allergies. OB HX" SHOULDER DYSTOCIA, THEN PPH REQUIRING BAKRI BALLOON,  WAS PLANNING A PRIMARY C/S WITH LAST PREGNANCY, THEN CAME IN EARLY WITH + SROM, DECIDED ON C/S. Physical Assessment:   Vitals:   12/26/19 1430  BP:  129/73  Pulse: (!) 112  Weight: 188 lb 9.6 oz (85.5 kg)  Body mass index is 38.09 kg/m.        Physical Examination:   General appearance: Well appearing, and in no distress  Mental status: Alert, oriented to person, place, and time  Skin: Warm & dry  Cardiovascular: Normal heart rate noted  Respiratory: Normal respiratory effort, no distress  Abdomen: Soft, gravid, nontender   Baby: 156   31 cm  Pelvic: Cervical exam deferred         Extremities: Edema: None  Fetal Status:     Movement: Present    Chaperone: Biomedical scientist    No results found for this or any previous visit (from the past 24 hour(s)).  Assessment & Plan:  1) Low-risk pregnancy [id number] at [redacted]w[redacted]d with an Estimated Date of Delivery: 03/04/20   2) Pt plans on IUD for birth control, followed by tubal ligation 6 months after   Meds: No orders of the defined types were placed in this encounter.  Labs/procedures today: U/A  Plan:  Continue routine obstetrical care WITH U/S EFW AT 49 WK. Next visit: prefers in person    Reviewed: Preterm labor symptoms and general obstetric precautions including but not limited to vaginal bleeding, contractions, leaking of fluid and fetal movement were reviewed in detail with the patient.  All questions were answered. Check bp weekly, let us know if >140/90.   Follow-up:  No follow-ups on file.  By signing my name below, I, [employer], attest that this documentation has been prepared under the direction and in the presence of Jonnie Kind, MD. Electronically Signed: [place]. 12/26/19. 2:43 PM.  I personally performed the services described in this documentation, which was SCRIBED in my presence. The recorded information has been reviewed and considered accurate. It has been edited as necessary during review. Jonnie Kind, MD

## 2019-12-31 ENCOUNTER — Other Ambulatory Visit: Payer: Self-pay | Admitting: Women's Health

## 2019-12-31 DIAGNOSIS — Z3A30 30 weeks gestation of pregnancy: Secondary | ICD-10-CM

## 2019-12-31 DIAGNOSIS — L299 Pruritus, unspecified: Secondary | ICD-10-CM

## 2020-01-03 LAB — COMPREHENSIVE METABOLIC PANEL
ALT: 10 IU/L (ref 0–32)
AST: 13 IU/L (ref 0–40)
Albumin/Globulin Ratio: 1.4 (ref 1.2–2.2)
Albumin: 3.6 g/dL — ABNORMAL LOW (ref 3.9–5.0)
Alkaline Phosphatase: 156 IU/L — ABNORMAL HIGH (ref 48–121)
BUN/Creatinine Ratio: 12 (ref 9–23)
BUN: 5 mg/dL — ABNORMAL LOW (ref 6–20)
Bilirubin Total: <0.2 mg/dL (ref 0.0–1.2)
CO2: 18 mmol/L — ABNORMAL LOW (ref 20–29)
Calcium: 9 mg/dL (ref 8.7–10.2)
Chloride: 102 mmol/L (ref 96–106)
Creatinine, Ser: 0.42 mg/dL — ABNORMAL LOW (ref 0.57–1.00)
GFR calc Af Amer: 169 mL/min/{1.73_m2} (ref >59)
GFR calc non Af Amer: 147 mL/min/{1.73_m2} (ref >59)
Globulin, Total: 2.6 g/dL (ref 1.5–4.5)
Glucose: 87 mg/dL (ref 65–99)
Potassium: 4.1 mmol/L (ref 3.5–5.2)
Sodium: 133 mmol/L — ABNORMAL LOW (ref 134–144)
Total Protein: 6.2 g/dL (ref 6.0–8.5)

## 2020-01-03 LAB — BILE ACIDS, TOTAL: Bile Acids Total: 2.3 umol/L (ref 0.0–10.0)

## 2020-01-10 ENCOUNTER — Ambulatory Visit (INDEPENDENT_AMBULATORY_CARE_PROVIDER_SITE_OTHER): Payer: Medicaid Other | Admitting: Advanced Practice Midwife

## 2020-01-10 VITALS — BP 123/73 | HR 112 | Wt 191.0 lb

## 2020-01-10 DIAGNOSIS — Z3480 Encounter for supervision of other normal pregnancy, unspecified trimester: Secondary | ICD-10-CM | POA: Diagnosis not present

## 2020-01-10 DIAGNOSIS — Z1389 Encounter for screening for other disorder: Secondary | ICD-10-CM | POA: Diagnosis not present

## 2020-01-10 DIAGNOSIS — Z3A32 32 weeks gestation of pregnancy: Secondary | ICD-10-CM | POA: Diagnosis not present

## 2020-01-10 DIAGNOSIS — Z331 Pregnant state, incidental: Secondary | ICD-10-CM | POA: Diagnosis not present

## 2020-01-10 LAB — POCT URINALYSIS DIPSTICK OB
Blood, UA: NEGATIVE
Glucose, UA: NEGATIVE
Ketones, UA: NEGATIVE
Leukocytes, UA: NEGATIVE
Nitrite, UA: NEGATIVE

## 2020-01-10 NOTE — Progress Notes (Signed)
   LOW-RISK PREGNANCY VISIT Patient name: Stacey Cook MRN [id number]  Date of birth: [birth year] Chief Complaint:   Routine Prenatal Visit  History of Present Illness:   Stacey Cook is a 24 y.o. [id number] female at [redacted]w[redacted]d with an Estimated Date of Delivery: 03/04/20 being seen today for ongoing management of a low-risk pregnancy.  Today she reports some pressure/cramps.. Contractions: Irregular. Vag. Bleeding: None.  Movement: Present. denies leaking of fluid. Review of Systems:   Pertinent items are noted in HPI Denies abnormal vaginal discharge w/ itching/odor/irritation, headaches, visual changes, shortness of breath, chest pain, abdominal pain, severe nausea/vomiting, or problems with urination or bowel movements unless otherwise stated above. Pertinent History Reviewed:  Reviewed past medical,surgical, social, obstetrical and family history.  Reviewed problem list, medications and allergies. Physical Assessment:   Vitals:   01/10/20 1439  BP: 123/73  Pulse: (!) 112  Weight: 191 lb (86.6 kg)  Body mass index is 38.58 kg/m.        Physical Examination:   General appearance: Well appearing, and in no distress  Mental status: Alert, oriented to person, place, and time  Skin: Warm & dry  Cardiovascular: Normal heart rate noted  Respiratory: Normal respiratory effort, no distress  Abdomen: Soft, gravid, nontender  Pelvic: Cervical exam performed  Dilation: Fingertip Effacement (%): Thick    Extremities: Edema: Trace  Fetal Status: Fetal Heart Rate (bpm): 141 Fundal Height: 32 cm Movement: Present    Chaperone: [place]    Results for orders placed or performed in visit on 01/10/20 (from the past 24 hour(s))  POC Urinalysis Dipstick OB   Collection Time: 01/10/20  2:41 PM  Result Value Ref Range   Color, UA     Clarity, UA     Glucose, UA Negative Negative   Bilirubin, UA     Ketones, UA n    Spec Grav, UA     Blood, UA n    pH, UA     POC,PROTEIN,UA Trace  Negative, Trace, Small (1+), Moderate (2+), Large (3+), 4+   Urobilinogen, UA     Nitrite, UA n    Leukocytes, UA Negative Negative   Appearance     Odor      Assessment & Plan:  1) Low-risk pregnancy [id number] at [redacted]w[redacted]d with an Estimated Date of Delivery: 03/04/20   2) prior CS for hx of SD, will decide if TOLACs based on EFW at 36 weeks   Meds: No orders of the defined types were placed in this encounter.  Labs/procedures today: none  Plan:  Continue routine obstetrical care  Next visit: prefers in person    Reviewed: Preterm labor symptoms and general obstetric precautions including but not limited to vaginal bleeding, contractions, leaking of fluid and fetal movement were reviewed in detail with the patient.  All questions were answered.    Follow-up: Return in about 2 weeks (around 01/24/2020) for LROB  and also 4 weeks EFW/MD visit LROB.  Orders Placed This Encounter  Procedures  . POC Urinalysis Dipstick OB   Christin Fudge DNP, CNM 01/10/2020 3:13 PM

## 2020-01-18 ENCOUNTER — Telehealth: Payer: Self-pay | Admitting: Advanced Practice Midwife

## 2020-01-18 ENCOUNTER — Other Ambulatory Visit: Payer: Self-pay

## 2020-01-18 ENCOUNTER — Ambulatory Visit (INDEPENDENT_AMBULATORY_CARE_PROVIDER_SITE_OTHER): Payer: Medicaid Other | Admitting: Obstetrics and Gynecology

## 2020-01-18 VITALS — BP 118/70 | HR 87 | Wt 191.0 lb

## 2020-01-18 DIAGNOSIS — Z3A33 33 weeks gestation of pregnancy: Secondary | ICD-10-CM | POA: Diagnosis not present

## 2020-01-18 DIAGNOSIS — Z3480 Encounter for supervision of other normal pregnancy, unspecified trimester: Secondary | ICD-10-CM | POA: Diagnosis not present

## 2020-01-18 DIAGNOSIS — Z331 Pregnant state, incidental: Secondary | ICD-10-CM | POA: Diagnosis not present

## 2020-01-18 DIAGNOSIS — Z1389 Encounter for screening for other disorder: Secondary | ICD-10-CM

## 2020-01-18 LAB — POCT URINALYSIS DIPSTICK OB
Blood, UA: NEGATIVE
Glucose, UA: NEGATIVE
Ketones, UA: NEGATIVE
Leukocytes, UA: NEGATIVE
Nitrite, UA: NEGATIVE

## 2020-01-18 NOTE — Progress Notes (Signed)
LOW-RISK PREGNANCY VISIT Patient name: Stacey Cook MRN [id number]  Date of birth: 1996/01/05 Chief Complaint:   Contractions (past 4 days getting worse, increase pressure)  History of Present Illness:   Stacey Cook is a 24 y.o. [phone] female at [redacted]w[redacted]d with an Estimated Date of Delivery: 03/04/20 being seen today for ongoing management of a low-risk pregnancy.  Depression screen [facility] 2/9 11/28/2019 08/23/2019 06/06/2018 04/28/2017  Decreased Interest 2 1 2 3   Down, Depressed, Hopeless 1 1 3 1   PHQ - 2 Score 3 2 5 4   Altered sleeping 3 3 3 3   Tired, decreased energy 3 3 3 3   Change in appetite 1 2 2 2   Feeling bad or failure about yourself  1 1 1  0  Trouble concentrating 2 1 1  0  Moving slowly or fidgety/restless 0 0 1 0  Suicidal thoughts 0 0 1 0  PHQ-9 Score 13 12 17 12   Difficult doing work/chores Somewhat difficult Somewhat difficult - Very difficult  Some recent data might be hidden    Today she reports contractions for the last 3-4 days. She also reports back pain that is exacerbated with movement. Contractions: Irregular. Vag. Bleeding: None.  Movement: Present. denies leaking of fluid. Review of Systems:   Pertinent items are noted in HPI Denies abnormal vaginal discharge w/ itching/odor/irritation, headaches, visual changes, shortness of breath, chest pain, abdominal pain, severe nausea/vomiting, or problems with urination or bowel movements unless otherwise stated above. Pertinent History Reviewed:  Reviewed past medical,surgical, social, obstetrical and family history.  Reviewed problem list, medications and allergies. Physical Assessment:   Vitals:   01/18/20 1145  BP: 118/70  Pulse: 87  Weight: 191 lb (86.6 kg)  Body mass index is 38.58 kg/m.        Physical Examination:   General appearance: Well appearing, and in no distress  Mental status: Alert, oriented to person, place, and time  Skin: Warm & dry  Cardiovascular: Normal heart rate noted  Respiratory:  Normal respiratory effort, no distress  Abdomen: Soft, gravid, nontender  Pelvic: Cervical exam performed Cervix appears normal, soft, long and closed. Normal secretions. Vertex well applied. 0.5 cm dilated. Positive whiff test.  Extremities: Edema: Trace  Fetal Status:     Movement: Present Presentation: Vertex  Chaperone: Alice Rieger    Results for orders placed or performed in visit on 01/18/20 (from the past 24 hour(s))  POC Urinalysis Dipstick OB   Collection Time: 01/18/20 11:49 AM  Result Value Ref Range   Color, UA     Clarity, UA     Glucose, UA Negative Negative   Bilirubin, UA     Ketones, UA neg    Spec Grav, UA     Blood, UA neg    pH, UA     POC,PROTEIN,UA Trace Negative, Trace, Small (1+), Moderate (2+), Large (3+), 4+   Urobilinogen, UA     Nitrite, UA neg    Leukocytes, UA Negative Negative   Appearance     Odor      Assessment & Plan:  1) Low-risk pregnancy [id number] at [redacted]w[redacted]d with an Estimated Date of Delivery: 03/04/20   2 pelvic pressure.  3 bact vaginosis Rx Metronidazole  3) Back pain, taking Flexeril   Meds: No orders of the defined types were placed in this encounter.  Labs/procedures today: NST  Plan: Continue routine obstetrical care.  Next visit: In person in six days.  Reviewed: Preterm labor symptoms and general obstetric precautions including but  not limited to vaginal bleeding, contractions, leaking of fluid and fetal movement were reviewed in detail with the patient.  All questions were answered. Has home bp cuff. Check bp weekly, let us know if >140/90.   Follow-up: Return in about 6 days (around 01/24/2020).  Orders Placed This Encounter  Procedures  . POC Urinalysis Dipstick OB   By signing my name below, I, Clerance Lav, attest that this documentation has been prepared under the direction and in the presence of Eure, Mertie Clause, MD. Electronically Signed: Carlton. 01/18/20. 12:26 PM.  I personally performed  the services described in this documentation, which was SCRIBED in my presence. The recorded information has been reviewed and considered accurate. It has been edited as necessary during review. Jonnie Kind, MD

## 2020-01-18 NOTE — Telephone Encounter (Signed)
Did not have to take message made pt appointment today to come and have urine ck per [facility]

## 2020-01-20 MED ORDER — METRONIDAZOLE 500 MG PO TABS
500.0000 mg | ORAL_TABLET | Freq: Two times a day (BID) | ORAL | 0 refills | Status: AC
Start: 2020-01-20 — End: 2020-01-27

## 2020-01-21 ENCOUNTER — Other Ambulatory Visit: Payer: Self-pay | Admitting: Women's Health

## 2020-01-21 DIAGNOSIS — Z3483 Encounter for supervision of other normal pregnancy, third trimester: Secondary | ICD-10-CM

## 2020-01-21 DIAGNOSIS — L299 Pruritus, unspecified: Secondary | ICD-10-CM

## 2020-01-21 MED ORDER — HYDROXYZINE HCL 25 MG PO TABS: 25.0000 mg | ORAL_TABLET | Freq: Three times a day (TID) | ORAL | 0 refills | Status: DC | PRN

## 2020-01-23 ENCOUNTER — Telehealth: Payer: Self-pay

## 2020-01-23 NOTE — Telephone Encounter (Signed)
New message  Seen on  9.10.21  Calling for test results

## 2020-01-23 NOTE — Telephone Encounter (Signed)
Pt aware bile acid results are not back yet. Pt has an appt tomorrow afternoon. Hopefully, results will be back then. Pt voiced understanding. Soper

## 2020-01-24 ENCOUNTER — Ambulatory Visit (INDEPENDENT_AMBULATORY_CARE_PROVIDER_SITE_OTHER): Payer: Medicaid Other | Admitting: Advanced Practice Midwife

## 2020-01-24 ENCOUNTER — Other Ambulatory Visit: Payer: Self-pay

## 2020-01-24 ENCOUNTER — Encounter: Payer: Self-pay | Admitting: Advanced Practice Midwife

## 2020-01-24 ENCOUNTER — Other Ambulatory Visit: Payer: Self-pay | Admitting: Women's Health

## 2020-01-24 ENCOUNTER — Other Ambulatory Visit: Payer: Medicaid Other

## 2020-01-24 ENCOUNTER — Encounter: Payer: Self-pay | Admitting: Women's Health

## 2020-01-24 VITALS — BP 105/61 | HR 104 | Wt 191.0 lb

## 2020-01-24 DIAGNOSIS — Z3A34 34 weeks gestation of pregnancy: Secondary | ICD-10-CM

## 2020-01-24 DIAGNOSIS — O26613 Liver and biliary tract disorders in pregnancy, third trimester: Secondary | ICD-10-CM

## 2020-01-24 DIAGNOSIS — O0990 Supervision of high risk pregnancy, unspecified, unspecified trimester: Secondary | ICD-10-CM

## 2020-01-24 DIAGNOSIS — K831 Obstruction of bile duct: Secondary | ICD-10-CM

## 2020-01-24 DIAGNOSIS — Z331 Pregnant state, incidental: Secondary | ICD-10-CM

## 2020-01-24 DIAGNOSIS — Z1389 Encounter for screening for other disorder: Secondary | ICD-10-CM

## 2020-01-24 LAB — COMPREHENSIVE METABOLIC PANEL
ALT: 28 IU/L (ref 0–32)
AST: 23 IU/L (ref 0–40)
Albumin/Globulin Ratio: 1.3 (ref 1.2–2.2)
Albumin: 3.5 g/dL — ABNORMAL LOW (ref 3.9–5.0)
Alkaline Phosphatase: 216 IU/L — ABNORMAL HIGH (ref 44–121)
BUN/Creatinine Ratio: 14 (ref 9–23)
BUN: 6 mg/dL (ref 6–20)
Bilirubin Total: 0.4 mg/dL (ref 0.0–1.2)
CO2: 21 mmol/L (ref 20–29)
Calcium: 8.8 mg/dL (ref 8.7–10.2)
Chloride: 104 mmol/L (ref 96–106)
Creatinine, Ser: 0.44 mg/dL — ABNORMAL LOW (ref 0.57–1.00)
GFR calc Af Amer: 167 mL/min/{1.73_m2} (ref >59)
GFR calc non Af Amer: 145 mL/min/{1.73_m2} (ref >59)
Globulin, Total: 2.6 g/dL (ref 1.5–4.5)
Glucose: 80 mg/dL (ref 65–99)
Potassium: 4.4 mmol/L (ref 3.5–5.2)
Sodium: 136 mmol/L (ref 134–144)
Total Protein: 6.1 g/dL (ref 6.0–8.5)

## 2020-01-24 LAB — BILE ACIDS, TOTAL: Bile Acids Total: 23 umol/L — ABNORMAL HIGH (ref 0.0–10.0)

## 2020-01-24 MED ORDER — URSODIOL 300 MG PO CAPS: 300.0000 mg | ORAL_CAPSULE | Freq: Three times a day (TID) | ORAL | 2 refills | Status: DC

## 2020-01-24 NOTE — Progress Notes (Signed)
   HIGH-RISK PREGNANCY VISIT Patient name: Stacey Cook MRN [id number]  Date of birth: 06/17/1995 Chief Complaint:   High Risk Gestation (NST)  History of Present Illness:   [name] is a 24 y.o. [phone] female at [redacted]w[redacted]d with an Estimated Date of Delivery: 03/04/20 being seen today for ongoing management of a high-risk pregnancy complicated by intrahepatic cholestasis of pregnancy Today she reports itching. Dx w/ICP tdoay, hasn't picked up meds yet. . Contractions: Irregular. Vag. Bleeding: None.  Movement: Present. denies leaking of fluid.  Review of Systems:   Pertinent items are noted in HPI Denies abnormal vaginal discharge w/ itching/odor/irritation, headaches, visual changes, shortness of breath, chest pain, abdominal pain, severe nausea/vomiting, or problems with urination or bowel movements unless otherwise stated above. Pertinent History Reviewed:  Reviewed past medical,surgical, social, obstetrical and family history.  Reviewed problem list, medications and allergies. Physical Assessment:   Vitals:   01/24/20 1506  BP: 105/61  Pulse: (!) 104  Weight: 191 lb (86.6 kg)  Body mass index is 38.58 kg/m.           Physical Examination:   General appearance: alert, well appearing, and in no distress  Mental status: alert, oriented to person, place, and time  Skin: warm & dry   Extremities: Edema: Trace    Cardiovascular: normal heart rate noted  Respiratory: normal respiratory effort, no distress  Abdomen: gravid, soft, non-tender  Pelvic: Cervical exam deferred         Fetal Status:     Movement: Present    Fetal Surveillance Testing today: NST: FHR baseline 150 bpm, Variability: moderate, Accelerations:present, Decelerations:  Absent= Cat 1/Reactive    No results found for this or any previous visit (from the past 24 hour(s)).  Assessment & Plan:  1) High-risk pregnancy [id number] at [redacted]w[redacted]d with an Estimated Date of Delivery: 03/04/20   2) ICP,  Dx today (BA 23)     Start actigall, testing, IOL 37 weeks  3) Hx of CS, Plans TOLAC  NEeds to sign consent w/MD Meds: No orders of the defined types were placed in this encounter.    Reviewed: Preterm labor symptoms and general obstetric precautions including but not limited to vaginal bleeding, contractions, leaking of fluid and fetal movement were reviewed in detail with the patient.  All questions were answered.   Follow-up: Return for weekly BPP/HROB (if no [name] appt available, scheudle for Mon: NST w/RN and Thurs NST/HROB instead).  Future Appointments  Date Time Provider Wilber  02/12/2020  3:30 PM [facility] [name] [facility] None  02/12/2020  4:10 PM Roma Schanz, CNM [facility]    Orders Placed This Encounter  Procedures  . POC Urinalysis Dipstick OB   Christin Fudge DNP, CNM 01/24/2020 3:54 PM

## 2020-01-24 NOTE — Patient Instructions (Signed)
Cholestasis of Pregnancy  Cholestasis refers to any condition that causes the flow of digestive fluid (bile) produced by the liver to slow down or stop. Cholestasis of pregnancy is most common toward the end of pregnancy (thirdtrimester), but it can occur any time during pregnancy. The condition often goes away soon after giving birth. Cholestasis may be uncomfortable, but it is usually harmless to you. However, it can be harmful to your baby. Cholestasis may increase the risk of:  Your baby being born too early (preterm delivery).  Your baby having a slow heart rate and lack of oxygen during delivery (fetal distress).  Losing your baby before delivery (stillbirth). What are the causes? The exact cause of this condition is not known, but it may be related to:  Pregnancy hormones. The gallbladder normally holds bile until you need it to help digest fat in your diet. Pregnancy hormones may cause the flow of bile to slow down and back up into your liver. Bile may then get into your bloodstream and cause cholestasis symptoms.  Changes in your genes (genetic mutations). Specifically, genes that affect how the liver releases bile. What increases the risk? You are more likely to develop this condition if:  You had cholestasis during a previous pregnancy.  You have a family history of cholestasis.  You have liver problems.  You are having multiple babies, such as twins or triplets. What are the signs or symptoms? The most common symptom of this condition is intense itching (pruritus), especially on the palms of your hands and soles of your feet. The itching can spread to the rest of your body and is often worse at night. You will not usually have a rash. Other symptoms may include:  Feeling tired.  Pain in your upper right abdomen.  Dark-colored urine.  Light-colored stools.  Poor appetite.  Yellowish discoloration of your skin and the whites of your eyes (jaundice). How is this  diagnosed? This condition is diagnosed based on:  Your medical history.  A physical exam.  Blood tests. If you have an inherited risk for developing this condition, you may also have genetic testing. How is this treated? The goal of treatment is to make you comfortable and keep your baby safe. Your health care provider may:  Prescribe medicine to reduce bile acid in your bloodstream, relieve symptoms, and help keep your baby safe.  Give you vitamin K before delivery to prevent excessive bleeding.  Check your baby frequently (fetal monitoring).  Perform regular blood tests to check your bile levels and liver function until your baby is delivered.  Recommend starting (inducing) your labor and delivery by week 36 or 37 of pregnancy, or as soon as your baby's lungs have developed enough. Follow these instructions at home:  Take over-the-counter and prescription medicines only as told by your health care provider.  Take cool baths to soothe itchy skin.  Wear comfortable, loose-fitting, cotton clothing to reduce itching.  Keep your fingernails short to prevent skin irritation from scratching.  Keep all follow-up visits and prenatal visits as told by your health care provider. This is important. Contact a health care provider if:  Your symptoms get worse, even with treatment.  You develop pain in your right side.  You have unusual swelling in your abdomen, feet, ankles, or legs.  You have a fever.  You are more thirsty than usual. Get help right away if:  You go into early labor.  You have a headache that does not go away or  causes changes in vision.  You have nausea or you vomit.  You have severe pain in your abdomen or shoulders.  You have shortness of breath. Summary  Cholestasis of pregnancy is most common toward the end of pregnancy (thirdtrimester), but it can occur any time during your pregnancy.  The condition often goes away soon after your baby is  born.  The most common symptom of cholestasis of pregnancy is intense itching (pruritus), especially on the palms of your hands and soles of your feet.  This condition may be treated with medicine, frequent monitoring, or starting (inducing) labor and delivery by week 36 or 37 of pregnancy. This information is not intended to replace advice given to you by your health care provider. Make sure you discuss any questions you have with your health care provider. Document Revised: 08/17/2018 Document Reviewed: 04/10/2016 Elsevier Patient Education  [place].

## 2020-01-28 ENCOUNTER — Other Ambulatory Visit: Payer: Medicaid Other

## 2020-01-31 ENCOUNTER — Other Ambulatory Visit: Payer: Self-pay | Admitting: Obstetrics and Gynecology

## 2020-01-31 ENCOUNTER — Ambulatory Visit (INDEPENDENT_AMBULATORY_CARE_PROVIDER_SITE_OTHER): Payer: Medicaid Other | Admitting: Obstetrics and Gynecology

## 2020-01-31 VITALS — BP 113/72 | HR 108 | Wt 190.6 lb

## 2020-01-31 DIAGNOSIS — Z331 Pregnant state, incidental: Secondary | ICD-10-CM

## 2020-01-31 DIAGNOSIS — K831 Obstruction of bile duct: Secondary | ICD-10-CM

## 2020-01-31 DIAGNOSIS — O26613 Liver and biliary tract disorders in pregnancy, third trimester: Secondary | ICD-10-CM

## 2020-01-31 DIAGNOSIS — O0990 Supervision of high risk pregnancy, unspecified, unspecified trimester: Secondary | ICD-10-CM | POA: Diagnosis not present

## 2020-01-31 DIAGNOSIS — Z1389 Encounter for screening for other disorder: Secondary | ICD-10-CM

## 2020-01-31 DIAGNOSIS — Z98891 History of uterine scar from previous surgery: Secondary | ICD-10-CM

## 2020-01-31 DIAGNOSIS — Z3A35 35 weeks gestation of pregnancy: Secondary | ICD-10-CM

## 2020-01-31 NOTE — Progress Notes (Signed)
   HIGH-RISK PREGNANCY VISIT Patient name: Stacey Cook MRN [id number]  Date of birth: 11/20/1995 Chief Complaint:   Routine Prenatal Visit (NST)  History of Present Illness:   Stacey Cook is a 24 y.o. [phone] female at [redacted]w[redacted]d with an Estimated Date of Delivery: 03/04/20 being seen today for ongoing management of a high-risk pregnancy complicated by Cholestasis of pregnancy, prior c/s for tolac, and hx shoulder dystocia.  Today she reports no complaints and adeq fm. suprapubic pressure.  Depression screen [facility] 2/9 11/28/2019 08/23/2019 06/06/2018 04/28/2017  Decreased Interest 2 1 2 3   Down, Depressed, Hopeless 1 1 3 1   PHQ - 2 Score 3 2 5 4   Altered sleeping 3 3 3 3   Tired, decreased energy 3 3 3 3   Change in appetite 1 2 2 2   Feeling bad or failure about yourself  1 1 1  0  Trouble concentrating 2 1 1  0  Moving slowly or fidgety/restless 0 0 1 0  Suicidal thoughts 0 0 1 0  PHQ-9 Score 13 12 17 12   Difficult doing work/chores Somewhat difficult Somewhat difficult - Very difficult  Some recent data might be hidden    Contractions: Irregular. Vag. Bleeding: None.  Movement: Present. denies leaking of fluid.  Review of Systems:   Pertinent items are noted in HPI Denies abnormal vaginal discharge w/ itching/odor/irritation, headaches, visual changes, shortness of breath, chest pain, abdominal pain, severe nausea/vomiting, or problems with urination or bowel movements unless otherwise stated above. Pertinent History Reviewed:  Reviewed past medical,surgical, social, obstetrical and family history.  Reviewed problem list, medications and allergies. Physical Assessment:   Vitals:   01/31/20 0906  BP: 113/72  Pulse: (!) 108  Weight: 190 lb 9.6 oz (86.5 kg)  Body mass index is 38.5 kg/m.           Physical Examination:   General appearance: alert, well appearing, and in no distress and oriented to person, place, and time  Mental status: alert, oriented to person, place, and time,  normal mood, behavior, speech, dress, motor activity, and thought processes  Skin: warm & dry   Extremities: Edema: Trace    Cardiovascular: normal heart rate noted  Respiratory: normal respiratory effort, no distress  Abdomen: gravid, soft, non-tender   Pelvic: Cervical exam performed  1 cm 50 -2 vertex        Fetal Status:     Movement: Present    Fetal Surveillance Testing today: nst reactive   Chaperone: Leonna Ameduite.     No results found for this or any previous visit (from the past 24 hour(s)).  Assessment & Plan:  1) High-risk pregnancy [id number] at [redacted]w[redacted]d with an Estimated Date of Delivery: 03/04/20 reactive NST  2) Cholestasis of pregnancy IOL at 37wk. 10/5    3) orders placed for pitocin iOL may use foley bulb., stable  Meds: No orders of the defined types were placed in this encounter.   Labs/procedures today:no  Treatment Plan:  o;  Reviewed: Term labor symptoms and general obstetric precautions including but not limited to vaginal bleeding, contractions, leaking of fluid and fetal movement were reviewed in detail with the patient.  All questions were answered. . Check bp weekly, let us know if >140/90.   Follow-up: No follow-ups on file.  Orders Placed This Encounter  Procedures  . POC Urinalysis Dipstick OB   Jonnie Kind CNM, [facility] 01/31/2020 9:57 AM

## 2020-01-31 NOTE — Patient Instructions (Addendum)

## 2020-02-04 ENCOUNTER — Ambulatory Visit (INDEPENDENT_AMBULATORY_CARE_PROVIDER_SITE_OTHER): Payer: Medicaid Other | Admitting: Women's Health

## 2020-02-04 ENCOUNTER — Encounter: Payer: Self-pay | Admitting: Women's Health

## 2020-02-04 ENCOUNTER — Other Ambulatory Visit: Payer: Self-pay

## 2020-02-04 ENCOUNTER — Other Ambulatory Visit (INDEPENDENT_AMBULATORY_CARE_PROVIDER_SITE_OTHER): Payer: Self-pay | Admitting: Internal Medicine

## 2020-02-04 ENCOUNTER — Other Ambulatory Visit (HOSPITAL_COMMUNITY)
Admission: RE | Admit: 2020-02-04 | Discharge: 2020-02-04 | Disposition: A | Discharge status: Home / Self Care | Payer: Medicaid Other | Source: Ambulatory Visit | Attending: Obstetrics & Gynecology | Admitting: Obstetrics & Gynecology

## 2020-02-04 VITALS — BP 120/67 | HR 101 | Wt 192.0 lb

## 2020-02-04 DIAGNOSIS — Z3A35 35 weeks gestation of pregnancy: Secondary | ICD-10-CM | POA: Insufficient documentation

## 2020-02-04 DIAGNOSIS — O26643 Intrahepatic cholestasis of pregnancy, third trimester: Secondary | ICD-10-CM

## 2020-02-04 DIAGNOSIS — Z331 Pregnant state, incidental: Secondary | ICD-10-CM | POA: Diagnosis not present

## 2020-02-04 DIAGNOSIS — O26613 Liver and biliary tract disorders in pregnancy, third trimester: Secondary | ICD-10-CM

## 2020-02-04 DIAGNOSIS — K831 Obstruction of bile duct: Secondary | ICD-10-CM | POA: Diagnosis not present

## 2020-02-04 DIAGNOSIS — Z113 Encounter for screening for infections with a predominantly sexual mode of transmission: Secondary | ICD-10-CM | POA: Diagnosis not present

## 2020-02-04 DIAGNOSIS — O0990 Supervision of high risk pregnancy, unspecified, unspecified trimester: Secondary | ICD-10-CM

## 2020-02-04 DIAGNOSIS — Z1389 Encounter for screening for other disorder: Secondary | ICD-10-CM

## 2020-02-04 DIAGNOSIS — O0993 Supervision of high risk pregnancy, unspecified, third trimester: Secondary | ICD-10-CM | POA: Diagnosis not present

## 2020-02-04 LAB — POCT URINALYSIS DIPSTICK OB
Blood, UA: NEGATIVE
Glucose, UA: NEGATIVE
Ketones, UA: NEGATIVE
Leukocytes, UA: NEGATIVE
Nitrite, UA: NEGATIVE
POC,PROTEIN,UA: NEGATIVE

## 2020-02-04 NOTE — Progress Notes (Signed)
HIGH-RISK PREGNANCY VISIT Patient name: Stacey Cook MRN [id number]  Date of birth: January 07, 1996 Chief Complaint:   Routine Prenatal Visit  History of Present Illness:   Stacey Cook is a 24 y.o. [id number] female at [redacted]w[redacted]d with an Estimated Date of Delivery: 03/04/20 being seen today for ongoing management of a high-risk pregnancy complicated by intrahepatic cholestasis of pregnancy currently on actigall 300mg  TID.  Today she reports still having itching, everywhere but palms of hands-hasn't been fasting today.  Depression screen [facility] 2/9 11/28/2019 08/23/2019 06/06/2018 04/28/2017  Decreased Interest 2 1 2 3   Down, Depressed, Hopeless 1 1 3 1   PHQ - 2 Score 3 2 5 4   Altered sleeping 3 3 3 3   Tired, decreased energy 3 3 3 3   Change in appetite 1 2 2 2   Feeling bad or failure about yourself  1 1 1  0  Trouble concentrating 2 1 1  0  Moving slowly or fidgety/restless 0 0 1 0  Suicidal thoughts 0 0 1 0  PHQ-9 Score 13 12 17 12   Difficult doing work/chores Somewhat difficult Somewhat difficult - Very difficult  Some recent data might be hidden    Contractions: Irregular. Vag. Bleeding: None.  Movement: Present. denies leaking of fluid.  Review of Systems:   Pertinent items are noted in HPI Denies abnormal vaginal discharge w/ itching/odor/irritation, headaches, visual changes, shortness of breath, chest pain, abdominal pain, severe nausea/vomiting, or problems with urination or bowel movements unless otherwise stated above. Pertinent History Reviewed:  Reviewed past medical,surgical, social, obstetrical and family history.  Reviewed problem list, medications and allergies. Physical Assessment:   Vitals:   02/04/20 1443  BP: 120/67  Pulse: (!) 101  Weight: 192 lb (87.1 kg)  Body mass index is 38.78 kg/m.           Physical Examination:   General appearance: alert, well appearing, and in no distress  Mental status: alert, oriented to person, place, and time  Skin: warm & dry    Extremities: Edema: Trace    Cardiovascular: normal heart rate noted  Respiratory: normal respiratory effort, no distress  Abdomen: gravid, soft, non-tender  Pelvic: Cervical exam performed  Dilation: 1 Effacement (%): Thick Station: -3  Fetal Status: Fetal Heart Rate (bpm): 125   Movement: Present Presentation: Vertex  Fetal Surveillance Testing today: NST: FHR baseline 125 bpm, Variability: moderate, Accelerations:present, Decelerations:  Absent= Cat 1/Reactive Toco: none     Chaperone: Stacey Cook    Results for orders placed or performed in visit on 02/04/20 (from the past 24 hour(s))  POC Urinalysis Dipstick OB   Collection Time: 02/04/20  2:41 PM  Result Value Ref Range   Color, UA     Clarity, UA     Glucose, UA Negative Negative   Bilirubin, UA     Ketones, UA n    Spec Grav, UA     Blood, UA n    pH, UA     POC,PROTEIN,UA Negative Negative, Trace, Small (1+), Moderate (2+), Large (3+), 4+   Urobilinogen, UA     Nitrite, UA n    Leukocytes, UA Negative Negative   Appearance     Odor      Assessment & Plan:  1) High-risk pregnancy [id number] at [redacted]w[redacted]d with an Estimated Date of Delivery: 03/04/20   2) ICP, on actigall 300mg  TID, still having pretty bad itching, NPO after MN, fasting bile acids and cmp in the am, has IOL scheduled for 10/5  3) H/O  shoulder dystocia, w/ 7lb4.8oz baby @ 37wks, hasn't had efw recently  4) Prev c/s> elective for h/o shoulder dystocia w/ suspected larger baby (was 8lb3oz), wants TOLAC, but was wanting to know EFW first. Signed TOLAC consent last week w/ JVF. Stacey Cook is out of office this week, will see if we can get her in at [facility], if not consider u/s on admission  5) H/O PPH> w/ Bakri balloon, plan TXA   Meds: No orders of the defined types were placed in this encounter.   Labs/procedures today: nst, gbs, gc/ct, sve  Treatment Plan:  nst thurs and mon, EFW asap, IOL 10/5  Reviewed: Preterm labor symptoms and general obstetric  precautions including but not limited to vaginal bleeding, contractions, leaking of fluid and fetal movement were reviewed in detail with the patient.  All questions were answered.   Follow-up: Return for am fasting labs, efw asap if possible; thurs nst/nurse, then next mond hrob/nst w/ md/cnm.  Orders Placed This Encounter  Procedures  . Strep Gp B NAA  . Comprehensive metabolic panel  . Bile acids, total  . POC Urinalysis Dipstick OB   Roma Schanz CNM, [facility] 02/04/2020 3:14 PM

## 2020-02-04 NOTE — Patient Instructions (Addendum)
Stacey Cook, I greatly value your feedback.  If you receive a survey following your visit with [name] today, we appreciate you taking the time to fill it out.  Thanks, [name], CNM, WHNP-BC  [facility] [place] at [facility] ([place], [place] [address]) Entrance C, located off of [place] parking   Go to [email or web address] to register for FREE online childbirth classes   Nothing to eat or drink after midnight   Call the office [phone]) or go to [facility] if:  You begin to have strong, frequent contractions  Your water breaks.  Sometimes it is a big gush of fluid, sometimes it is just a trickle that keeps getting your panties wet or running down your legs  You have vaginal bleeding.  It is normal to have a small amount of spotting if your cervix was checked.   You don't feel your baby moving like normal.  If you don't, get you something to eat and drink and lay down and focus on feeling your baby move.  You should feel at least 10 movements in 2 hours.  If you don't, you should call the office or go to [facility].   Call the office [phone]) or go to [facility] hospital for these signs of pre-eclampsia:  Severe headache that does not go away with Tylenol  Visual changes- seeing spots, double, blurred vision  Pain under your right breast or upper abdomen that does not go away with Tums or heartburn medicine  Nausea and/or vomiting  Severe swelling in your hands, feet, and face    Home Blood Pressure Monitoring for Patients   Your provider has recommended that you check your blood pressure (BP) at least once a week at home. If you do not have a blood pressure cuff at home, one will be provided for you. Contact your provider if you have not received your monitor within 1 week.   Helpful Tips for Accurate Home Blood Pressure Checks  . Don't smoke, exercise, or drink caffeine 30 minutes before checking your BP . Use the  restroom before checking your BP (a full bladder can raise your pressure) . Relax in a comfortable upright chair . Feet on the ground . Left arm resting comfortably on a flat surface at the level of your heart . Legs uncrossed . Back supported . Sit quietly and don't talk . Place the cuff on your bare arm . Adjust snuggly, so that only two fingertips can fit between your skin and the top of the cuff . Check 2 readings separated by at least one minute . Keep a log of your BP readings . For a visual, please reference this diagram: http://ccnc.care/bpdiagram  Provider Name: [facility]     Phone: [phone]  Zone 1: ALL CLEAR  Continue to monitor your symptoms:  . BP reading is less than 140 (top number) or less than 90 (bottom number)  . No right upper stomach pain . No headaches or seeing spots . No feeling nauseated or throwing up . No swelling in face and hands  Zone 2: CAUTION Call your doctor's office for any of the following:  . BP reading is greater than 140 (top number) or greater than 90 (bottom number)  . Stomach pain under your ribs in the middle or right side . Headaches or seeing spots . Feeling nauseated or throwing up . Swelling in face and hands  Zone 3: EMERGENCY  Seek  immediate medical care if you have any of the following:  . BP reading is greater than160 (top number) or greater than 110 (bottom number) . Severe headaches not improving with Tylenol . Serious difficulty catching your breath . Any worsening symptoms from Zone 2  Preterm Labor and Birth Information  The normal length of a pregnancy is 39-41 weeks. Preterm labor is when labor starts before 37 completed weeks of pregnancy. What are the risk factors for preterm labor? Preterm labor is more likely to occur in women who:  Have certain infections during pregnancy such as a bladder infection, sexually transmitted infection, or infection inside the uterus (chorioamnionitis).  Have a  shorter-than-normal cervix.  Have gone into preterm labor before.  Have had surgery on their cervix.  Are younger than age 35 or older than age 68.  Are African American.  Are pregnant with twins or multiple babies (multiple gestation).  Take street drugs or smoke while pregnant.  Do not gain enough weight while pregnant.  Became pregnant shortly after having been pregnant. What are the symptoms of preterm labor? Symptoms of preterm labor include:  Cramps similar to those that can happen during a menstrual period. The cramps may happen with diarrhea.  Pain in the abdomen or lower back.  Regular uterine contractions that may feel like tightening of the abdomen.  A feeling of increased pressure in the pelvis.  Increased watery or bloody mucus discharge from the vagina.  Water breaking (ruptured amniotic sac). Why is it important to recognize signs of preterm labor? It is important to recognize signs of preterm labor because babies who are born prematurely may not be fully developed. This can put them at an increased risk for:  Long-term (chronic) heart and lung problems.  Difficulty immediately after birth with regulating body systems, including blood sugar, body temperature, heart rate, and breathing rate.  Bleeding in the brain.  Cerebral palsy.  Learning difficulties.  Death. These risks are highest for babies who are born before 60 weeks of pregnancy. How is preterm labor treated? Treatment depends on the length of your pregnancy, your condition, and the health of your baby. It may involve: 1. Having a stitch (suture) placed in your cervix to prevent your cervix from opening too early (cerclage). 2. Taking or being given medicines, such as: ? Hormone medicines. These may be given early in pregnancy to help support the pregnancy. ? Medicine to stop contractions. ? Medicines to help mature the baby's lungs. These may be prescribed if the risk of delivery is  high. ? Medicines to prevent your baby from developing cerebral palsy. If the labor happens before 34 weeks of pregnancy, you may need to stay in the hospital. What should I do if I think I am in preterm labor? If you think that you are going into preterm labor, call your health care provider right away. How can I prevent preterm labor in future pregnancies? To increase your chance of having a full-term pregnancy:  Do not use any tobacco products, such as cigarettes, chewing tobacco, and e-cigarettes. If you need help quitting, ask your health care provider.  Do not use street drugs or medicines that have not been prescribed to you during your pregnancy.  Talk with your health care provider before taking any herbal supplements, even if you have been taking them regularly.  Make sure you gain a healthy amount of weight during your pregnancy.  Watch for infection. If you think that you might have an infection, get  it checked right away.  Make sure to tell your health care provider if you have gone into preterm labor before. This information is not intended to replace advice given to you by your health care provider. Make sure you discuss any questions you have with your health care provider. Document Revised: 08/18/2018 Document Reviewed: 09/17/2015 Elsevier Patient Education  [place].

## 2020-02-05 ENCOUNTER — Encounter (HOSPITAL_COMMUNITY): Payer: Self-pay | Admitting: *Deleted

## 2020-02-05 ENCOUNTER — Other Ambulatory Visit: Payer: Medicaid Other

## 2020-02-05 ENCOUNTER — Telehealth (HOSPITAL_COMMUNITY): Payer: Self-pay | Admitting: *Deleted

## 2020-02-05 ENCOUNTER — Other Ambulatory Visit: Payer: Self-pay | Admitting: Family Medicine

## 2020-02-05 NOTE — Telephone Encounter (Signed)
Preadmission screen  

## 2020-02-06 LAB — CERVICOVAGINAL ANCILLARY ONLY
Chlamydia: NEGATIVE
Comment: NEGATIVE
Comment: NORMAL
Neisseria Gonorrhea: NEGATIVE

## 2020-02-06 LAB — STREP GP B NAA: Strep Gp B NAA: POSITIVE — AB

## 2020-02-07 ENCOUNTER — Ambulatory Visit (INDEPENDENT_AMBULATORY_CARE_PROVIDER_SITE_OTHER): Payer: Medicaid Other | Admitting: Advanced Practice Midwife

## 2020-02-07 ENCOUNTER — Encounter: Payer: Self-pay | Admitting: Advanced Practice Midwife

## 2020-02-07 ENCOUNTER — Other Ambulatory Visit: Payer: Self-pay

## 2020-02-07 VITALS — BP 111/65 | HR 96 | Wt 193.4 lb

## 2020-02-07 DIAGNOSIS — Z331 Pregnant state, incidental: Secondary | ICD-10-CM

## 2020-02-07 DIAGNOSIS — O0990 Supervision of high risk pregnancy, unspecified, unspecified trimester: Secondary | ICD-10-CM

## 2020-02-07 DIAGNOSIS — Z1389 Encounter for screening for other disorder: Secondary | ICD-10-CM

## 2020-02-07 LAB — POCT URINALYSIS DIPSTICK OB
Blood, UA: NEGATIVE
Glucose, UA: NEGATIVE
Ketones, UA: NEGATIVE
Leukocytes, UA: NEGATIVE
Nitrite, UA: NEGATIVE
POC,PROTEIN,UA: NEGATIVE

## 2020-02-07 NOTE — Patient Instructions (Signed)
Stacey Cook, I greatly value your feedback.  If you receive a survey following your visit with [name] today, we appreciate you taking the time to fill it out.  Thanks, Nigel Berthold, DNP, CNM  [place]!!! It is now [place] at [facility] ([place], [place] [address]) Entrance located off of [place] parking   Go to [email or web address] to register for FREE online childbirth classes    Call the office [phone]) or go to [place] if:  You begin to have strong, frequent contractions  Your water breaks.  Sometimes it is a big gush of fluid, sometimes it is just a trickle that keeps getting your panties wet or running down your legs  You have vaginal bleeding.  It is normal to have a small amount of spotting if your cervix was checked.   You don't feel your baby moving like normal.  If you don't, get you something to eat and drink and lay down and focus on feeling your baby move.  You should feel at least 10 movements in 2 hours.  If you don't, you should call the office or go to [facility] Blood Pressure Monitoring for Patients   Your provider has recommended that you check your blood pressure (BP) at least once a week at home. If you do not have a blood pressure cuff at home, one will be provided for you. Contact your provider if you have not received your monitor within 1 week.   Helpful Tips for Accurate Home Blood Pressure Checks  . Don't smoke, exercise, or drink caffeine 30 minutes before checking your BP . Use the restroom before checking your BP (a full bladder can raise your pressure) . Relax in a comfortable upright chair . Feet on the ground . Left arm resting comfortably on a flat surface at the level of your heart . Legs uncrossed . Back supported . Sit quietly and don't talk . Place the cuff on your bare arm . Adjust snuggly, so that only two fingertips can fit  between your skin and the top of the cuff . Check 2 readings separated by at least one minute . Keep a log of your BP readings . For a visual, please reference this diagram: http://ccnc.care/bpdiagram  Provider Name: [facility]     Phone: [phone]  Zone 1: ALL CLEAR  Continue to monitor your symptoms:  . BP reading is less than 140 (top number) or less than 90 (bottom number)  . No right upper stomach pain . No headaches or seeing spots . No feeling nauseated or throwing up . No swelling in face and hands  Zone 2: CAUTION Call your doctor's office for any of the following:  . BP reading is greater than 140 (top number) or greater than 90 (bottom number)  . Stomach pain under your ribs in the middle or right side . Headaches or seeing spots . Feeling nauseated or throwing up . Swelling in face and hands  Zone 3: EMERGENCY  Seek immediate medical care if you have any of the following:  . BP reading is greater than160 (top number) or greater than 110 (bottom number) . Severe headaches not improving with Tylenol . Serious difficulty catching your breath . Any worsening symptoms from Zone 2

## 2020-02-07 NOTE — Progress Notes (Signed)
HIGH-RISK PREGNANCY VISIT Patient name: Stacey Cook MRN [id number]  Date of birth: July 28, 1995 Chief Complaint:   Routine Prenatal Visit (NST/ decrease fetal movement)  History of Present Illness:   Stacey Cook is a 24 y.o. [phone] female at [redacted]w[redacted]d with an Estimated Date of Delivery: 03/04/20 being seen today for ongoing management of a high-risk pregnancy complicated by intrahepatic cholestasis of pregnancy Today she reports itching some, but benadryl cream helps. Contractions: Irregular.  .  Movement: (!) Decreased. lasat night, normal now denies leaking of fluid.  Review of Systems:   Pertinent items are noted in HPI Denies abnormal vaginal discharge w/ itching/odor/irritation, headaches, visual changes, shortness of breath, chest pain, abdominal pain, severe nausea/vomiting, or problems with urination or bowel movements unless otherwise stated above. Pertinent History Reviewed:  Reviewed past medical,surgical, social, obstetrical and family history.  Reviewed problem list, medications and allergies. Physical Assessment:   Vitals:   02/07/20 1445  BP: 111/65  Pulse: 96  Weight: 193 lb 6.4 oz (87.7 kg)  Body mass index is 39.06 kg/m.           Physical Examination:   General appearance: alert, well appearing, and in no distress  Mental status: alert, oriented to person, place, and time  Skin: warm & dry   Extremities: Edema: Trace    Cardiovascular: normal heart rate noted  Respiratory: normal respiratory effort, no distress  Abdomen: gravid, soft, non-tender  Pelvic: Cervical exam deferred         Fetal Status:     Movement: (!) Decreased    Fetal Surveillance Testing today: NST: FHR baseline 140 bpm, Variability: moderate, Accelerations:present, Decelerations:  Absent= Cat 1/Reactive    Results for orders placed or performed in visit on 02/07/20 (from the past 24 hour(s))  POC Urinalysis Dipstick OB   Collection Time: 02/07/20  2:53 PM  Result Value Ref Range    Color, UA     Clarity, UA     Glucose, UA Negative Negative   Bilirubin, UA     Ketones, UA neg    Spec Grav, UA     Blood, UA neg    pH, UA     POC,PROTEIN,UA Negative Negative, Trace, Small (1+), Moderate (2+), Large (3+), 4+   Urobilinogen, UA     Nitrite, UA neg    Leukocytes, UA Negative Negative   Appearance     Odor      Assessment & Plan:  1) High-risk pregnancy [id number] at [redacted]w[redacted]d with an Estimated Date of Delivery: 03/04/20   2) ICP, stable Treatment plan  Deliver at 37 weeks  3) Hx CS and hx shoulder dystocia,   Treatment plan:  Will do an EFW upon admission (both [facility] and [facility] are fully booked for [name]   Meds: No orders of the defined types were placed in this encounter.      Reviewed: Preterm labor symptoms and general obstetric precautions including but not limited to vaginal bleeding, contractions, leaking of fluid and fetal movement were reviewed in detail with the patient.  All questions were answered. Has home bp cuff.. Check bp weekly, let us know if >140/90.   Follow-up: Return for If you have any problems.  Future Appointments  Date Time Provider [place]  02/11/2020  2:10 PM [name], MD [facility]  02/12/2020  8:25 AM [facility] SCHED ROOM [facility] None    Orders Placed This Encounter  Procedures   POC Urinalysis Dipstick OB   Christin Fudge  DNP, CNM 02/08/2020 12:08 AM

## 2020-02-10 ENCOUNTER — Emergency Department (HOSPITAL_COMMUNITY)
Admission: EM | Admit: 2020-02-10 | Discharge: 2020-02-10 | Disposition: A | Discharge status: Home / Self Care | Payer: Medicaid Other | Attending: Emergency Medicine | Admitting: Emergency Medicine

## 2020-02-10 ENCOUNTER — Other Ambulatory Visit: Payer: Self-pay

## 2020-02-10 ENCOUNTER — Encounter (HOSPITAL_COMMUNITY): Payer: Self-pay | Admitting: Emergency Medicine

## 2020-02-10 DIAGNOSIS — J45909 Unspecified asthma, uncomplicated: Secondary | ICD-10-CM | POA: Insufficient documentation

## 2020-02-10 DIAGNOSIS — L03811 Cellulitis of head [any part, except face]: Secondary | ICD-10-CM | POA: Diagnosis not present

## 2020-02-10 DIAGNOSIS — R519 Headache, unspecified: Secondary | ICD-10-CM | POA: Diagnosis present

## 2020-02-10 DIAGNOSIS — Z87891 Personal history of nicotine dependence: Secondary | ICD-10-CM | POA: Insufficient documentation

## 2020-02-10 MED ORDER — GENTAMICIN SULFATE 0.1 % EX CREA
TOPICAL_CREAM | Freq: Four times a day (QID) | CUTANEOUS | Status: DC
  Filled 2020-02-10: qty 15

## 2020-02-10 MED ORDER — ACETAMINOPHEN 500 MG PO TABS
1000.0000 mg | ORAL_TABLET | Freq: Once | ORAL | Status: AC
  Administered 2020-02-10: 1000 mg via ORAL
  Filled 2020-02-10: qty 2

## 2020-02-10 MED ORDER — CLINDAMYCIN HCL 150 MG PO CAPS: ORAL_CAPSULE | ORAL | 0 refills | Status: DC

## 2020-02-10 MED ORDER — CLINDAMYCIN HCL 150 MG PO CAPS
300.0000 mg | ORAL_CAPSULE | Freq: Once | ORAL | Status: AC
  Administered 2020-02-10: 300 mg via ORAL
  Filled 2020-02-10: qty 2

## 2020-02-10 MED ORDER — GENTAMICIN SULFATE 0.1 % EX CREA: TOPICAL_CREAM | Freq: Four times a day (QID) | CUTANEOUS | Status: DC

## 2020-02-10 NOTE — ED Notes (Signed)
Pt refused CGB, MD notified.

## 2020-02-10 NOTE — Discharge Instructions (Addendum)
Keep your appointment on Tuesday, October 5, about inducing your labor.  But please call [facility] tomorrow to let them know about your scalp infection in case they want to postpone your delivery until that has improved.  Use heat on the area for comfort and swelling.  Take acetaminophen 1000 mg 4 times a day.  Apply the gentamicin ointment around the infected screw 4 times a day.  Take the clindamycin 4 times a day.  Recheck if you are not improving over the next 48 hours.  If it is getting worse please contact your ENT physician.

## 2020-02-10 NOTE — ED Triage Notes (Addendum)
Patient states swelling around the site of her hearing implant that was placed about a year ago on the right side. Patient also states that she has been cleaning the area and noticed green drainage x 2 days. Chief complaints severe pain/pressure and swelling around the implant site.

## 2020-02-10 NOTE — ED Provider Notes (Signed)
[facility] EMERGENCY DEPARTMENT Provider Note   CSN: [id number] Arrival date & time: 02/10/20  7628   Time seen 4:47 AM  History Chief Complaint  Patient presents with  . Headache    possible infection    Stacey Cook is a 24 y.o. female.  HPI   Patient is [redacted] weeks pregnant and states she is going to be induced on October 5 because of ICP (intrahepatic cholestasis of pregnancy).  Patient has a BA HA exchange of track to connect April 12, 2013.  She was seen by the pediatric ENT service at [facility] on April 02, 2019 when she had a cellulitis around the site.  It was treated with gentamicin topically and clindamycin orally.  Patient states about 2 days ago it started getting painful and swollen and she states she "cannot see the screw".  She thinks that it is an infection.  She states it started swelling several hours ago tonight and then started draining green pus a few hours ago.  She states she has had the infections before but feels like this is worse.  She took Tylenol 1000 mg about 8 PM.  She is unaware of fever.  PCP Lavella Lemons, PA   Past Medical History:  Diagnosis Date  . Abdominal pain   . Anemia   . Anginal pain ([place])   . Anxiety   . Arthritis    knees  . Asthma   . Cervicalgia   . Cholestasis during pregnancy   . Chronic abdominal pain   . Complication of anesthesia    light headed  . Constipation   . Ear mass   . Gestational diabetes    pt states not been checking sugars regularly at home; nor has she been taking her Metformin  . Gestational diabetes   . Headache   . 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 HSV infection   . Low iron   . Mental disorder   . PONV (postoperative nausea and vomiting)   . Suicidal intent   . UTI (lower urinary tract infection) 05/2014  . Vomiting     Patient Active Problem List   Diagnosis Date Noted  . Supervision of high risk pregnancy, antepartum 08/23/2019  . Cellulitis of scalp  04/02/2019  . Depression, postpartum 02/09/2019  . Epidermal cyst 02/09/2019  . Status post cesarean section 01/03/2019  . 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 Gastroesophageal reflux disease   . Pancreatitis 01/12/2018  . History of shoulder dystocia in prior pregnancy 12/02/2017  . History of postpartum hemorrhage 12/02/2017  . Cholestasis during pregnancy in third trimester 10/27/2017  . History of cholestasis during pregnancy 09/21/2017  . History of gestational diabetes 08/24/2017  . Chest pain due to GERD 08/05/2017  . Depression with anxiety and suicidal ideations 06/13/2017  . History of miscarriage 01/18/2017  . Conductive hearing loss, unilat, unrestrict hearing contralateral side 12/10/2015  . 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 Asthma, mild intermittent 02/18/2015  . 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 Perforation of left tympanic membrane 06/19/2014  . ADHD (attention deficit hyperactivity disorder), combined type 08/01/2012  . ODD (oppositional defiant disorder) 08/01/2012  . Vasovagal syncope 02/28/2012  . Cholesteatoma of attic of right ear 02/15/2011  Past Surgical History:  Procedure Laterality Date  . CESAREAN SECTION N/A 01/03/2019   Procedure: CESAREAN SECTION;  Surgeon: [name], MD;  Location: [facility] OR[facility];  Service: Obstetrics;  Laterality: N/A;  . CHOLECYSTECTOMY N/A 01/16/2018   Procedure: LAPAROSCOPIC CHOLECYSTECTOMY;  Surgeon: Aviva Signs, MD;  Location: [facility] ORS;  Service: General;  Laterality: N/A;  per Dr. Arnoldo Morale, pt will most likely go home and he will tell her to arrive at 10:45 - already has labs  . CHOLESTEATOMA EXCISION    . COCHLEAR IMPLANT Right   . DILATION AND EVACUATION N/A 09/11/2016   Procedure: DILATATION AND CURRATAGE  2ND  TRIMESTER;  Surgeon: Jonnie Kind, MD;  Location: [place] ORS;  Service: Gynecology;  Laterality: N/A;  . IMPLANTATION BONE ANCHORED HEARING AID Right 04/2013  . INNER EAR SURGERY Right 03/31/2018  . 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 TYMPANOPLASTY Left   . TYMPANOSTOMY       OB History    Gravida  5   Para  2   Term  2   Preterm  0   AB  2   Living  2     SAB  2   TAB  0   Ectopic  0   Multiple  0   Live Births  2           Family History  Problem Relation Age of Onset  . Cholelithiasis Mother   . Kidney disease Mother        stones  . Depression Mother   . Hypertension Maternal Grandmother   . Diabetes Maternal Grandmother   . Stroke Maternal Grandmother   . Seizures Maternal Grandmother   . Asthma Maternal Grandmother   . Hyperlipidemia Maternal Grandmother   . Thyroid disease Maternal Grandmother   . Cancer Maternal Grandmother   . Asthma Brother   . Seizures Maternal Uncle   . Cancer Other        breast- great aunt  . Celiac disease Neg Hx     Social History   Tobacco Use  . Smoking status: Former Smoker    Packs/day: 0.25    Years: 1.00    Pack years: 0.25    Types: Cigarettes  . Smokeless tobacco: Never Used  . Tobacco comment: "3-4 cigs a day "  Vaping Use  . Vaping Use: Former  Substance Use Topics  . Alcohol use: No    Alcohol/week: 0.0 standard drinks  . Drug use: No    Home Medications Prior to Admission medications   Medication Sig Start Date End Date Taking? Authorizing Provider  acetaminophen (TYLENOL) 500 MG tablet Take 2 tablets (1,000 mg total) by mouth every 6 (six) hours as needed for mild pain, moderate pain or headache. 08/14/54   Arrie Senate, MD  albuterol (PROVENTIL) (2.5 MG/3ML) 0.083% nebulizer solution SMARTSIG:1 Vial(s) Via Nebulizer Every 6-8 Hours PRN 01/29/20   [provider]  Blood Pressure Monitor MISC For regular home bp monitoring during pregnancy 10/04/19   Roma Schanz, CNM  clindamycin (CLEOCIN) 150 MG capsule Take 2 PO QID x 10 days 02/10/20   Rolland Porter, MD  hydrOXYzine (ATARAX/VISTARIL) 25 MG tablet Take 1 tablet (25 mg total) by mouth 3 (three) times daily as needed. 01/21/20   Roma Schanz, CNM  Pediatric Multiple Vit-C-FA (FLINSTONES GUMMIES OMEGA-3 DHA PO) Take by mouth. Takes 2 daily    [provider]  ursodiol (ACTIGALL)  300 MG capsule Take 1 capsule (300 mg total) by mouth 3 (three) times daily. 01/24/20   Roma Schanz, CNM    Allergies    Dilaudid [hydromorphone], Keflex [cephalexin], Other, and Adhesive [tape]  Review of Systems   Review of Systems  All other systems reviewed and are negative.   Physical Exam Updated Vital Signs BP 121/66 (BP Location: Right Arm)   Pulse 78   Temp 97.8 F (36.6 C) (Oral)   Resp 18   Ht 4\' 11"  (1.499 m)   Wt 88 kg   LMP 05/29/2019   SpO2 99%   BMI 39.18 kg/m   Physical Exam Vitals and nursing note reviewed.  Constitutional:      Appearance: Normal appearance.  HENT:     Head: Normocephalic and atraumatic.     Comments: When I examine her right scalp there is a screw visible with some mild redness and swelling around it.  I do not see any active drainage.    Right Ear: External ear normal.     Left Ear: External ear normal.  Eyes:     Extraocular Movements: Extraocular movements intact.     Conjunctiva/sclera: Conjunctivae normal.     Pupils: Pupils are equal, round, and reactive to light.  Cardiovascular:     Rate and Rhythm: Normal rate.  Pulmonary:     Effort: Pulmonary effort is normal. No respiratory distress.  Abdominal:     Comments: Consistent with dates, fetal heart rate 140  Musculoskeletal:        General: Normal range of motion.  Skin:    General: Skin is warm and dry.  Neurological:     General: No focal deficit present.     Mental Status: She is alert and oriented to person, place, and time.     Cranial Nerves: No cranial nerve deficit.    Psychiatric:        Mood and Affect: Mood normal.        Thought Content: Thought content normal.         This Visit   Infection after surgery 1 year ago    After surgery 1 year ago     ED Results / Procedures / Treatments   Labs (all labs ordered are listed, but only abnormal results are displayed) Labs Reviewed  CBG MONITORING, ED   Patient refused to have CBG done  EKG None  Radiology No results found.  Procedures Procedures (including critical care time)  Medications Ordered in ED Medications  acetaminophen (TYLENOL) tablet 1,000 mg (has no administration in time range)  gentamicin cream (GARAMYCIN) 0.1 % (has no administration in time range)  clindamycin (CLEOCIN) capsule 300 mg (has no administration in time range)    ED Course  I have reviewed the triage vital signs and the nursing notes.  Pertinent labs & imaging results that were available during my care of the patient were reviewed by me and considered in my medical decision making (see chart for details).    MDM Rules/Calculators/A&P                          Patient was advised to use heat on the area.  I talked to Dr. Elgie Congo, [facility] on-call at 5:31 AM.  He states topical gentamicin and oral clindamycin is okay with her pregnancy.  She was given acetaminophen 1000 mg, her last dose was at 8 PM.  Patient really wants something stronger however she is  going to have her pregnancy-induced in 48 hours.    Final Clinical Impression(s) / ED Diagnoses Final diagnoses:  Cellulitis of scalp    Rx / DC Orders ED Discharge Orders         Ordered    clindamycin (CLEOCIN) 150 MG capsule        02/10/20 0540         Plan discharge  Rolland Porter, MD, Barbette Or, MD 02/10/20 [phone]

## 2020-02-10 NOTE — ED Notes (Signed)
Called [facility] for medication.  °

## 2020-02-10 NOTE — ED Notes (Signed)
Fetal heart tones 140 bpm

## 2020-02-11 ENCOUNTER — Ambulatory Visit (INDEPENDENT_AMBULATORY_CARE_PROVIDER_SITE_OTHER): Payer: Medicaid Other | Admitting: Obstetrics & Gynecology

## 2020-02-11 VITALS — BP 124/75 | HR 91 | Wt 193.0 lb

## 2020-02-11 DIAGNOSIS — Z3A36 36 weeks gestation of pregnancy: Secondary | ICD-10-CM

## 2020-02-11 DIAGNOSIS — Z3483 Encounter for supervision of other normal pregnancy, third trimester: Secondary | ICD-10-CM

## 2020-02-11 DIAGNOSIS — Z1389 Encounter for screening for other disorder: Secondary | ICD-10-CM

## 2020-02-11 DIAGNOSIS — Z331 Pregnant state, incidental: Secondary | ICD-10-CM

## 2020-02-11 LAB — POCT URINALYSIS DIPSTICK OB
Blood, UA: NEGATIVE
Glucose, UA: NEGATIVE
Ketones, UA: NEGATIVE
Leukocytes, UA: NEGATIVE
Nitrite, UA: NEGATIVE

## 2020-02-11 NOTE — Progress Notes (Signed)
HIGH-RISK PREGNANCY VISIT Patient name: Stacey Cook MRN [id number]  Date of birth: [birth year] Chief Complaint:   Routine Prenatal Visit (NST)  History of Present Illness:   Stacey Cook is a 24 y.o. [id number] female at [redacted]w[redacted]d with an Estimated Date of Delivery: 03/04/20 being seen today for ongoing management of a high-risk pregnancy complicated by ICP.  Today she reports pain in head where infected.  Depression screen [facility] 2/9 11/28/2019 08/23/2019 06/06/2018 04/28/2017  Decreased Interest 2 1 2 3   Down, Depressed, Hopeless 1 1 3 1   PHQ - 2 Score 3 2 5 4   Altered sleeping 3 3 3 3   Tired, decreased energy 3 3 3 3   Change in appetite 1 2 2 2   Feeling bad or failure about yourself  1 1 1  0  Trouble concentrating 2 1 1  0  Moving slowly or fidgety/restless 0 0 1 0  Suicidal thoughts 0 0 1 0  PHQ-9 Score 13 12 17 12   Difficult doing work/chores Somewhat difficult Somewhat difficult - Very difficult  Some recent data might be hidden    Contractions: Irritability. Vag. Bleeding: None.  Movement: Present. denies leaking of fluid.  Review of Systems:   Pertinent items are noted in HPI Denies abnormal vaginal discharge w/ itching/odor/irritation, headaches, visual changes, shortness of breath, chest pain, abdominal pain, severe nausea/vomiting, or problems with urination or bowel movements unless otherwise stated above. Pertinent History Reviewed:  Reviewed past medical,surgical, social, obstetrical and family history.  Reviewed problem list, medications and allergies. Physical Assessment:   Vitals:   02/11/20 1400  BP: 124/75  Pulse: 91  Weight: 193 lb (87.5 kg)  Body mass index is 38.98 kg/m.           Physical Examination:   General appearance: alert, well appearing, and in no distress  Mental status: alert, oriented to person, place, and time  Skin: warm & dry   Extremities: Edema: Trace    Cardiovascular: normal heart rate noted  Respiratory: normal respiratory effort, no  distress  Abdomen: gravid, soft, non-tender  Pelvic: Cervical exam performed         Fetal Status:     Movement: Present    Fetal Surveillance Testing today: reactive NST   Chaperone: Amanda Rash    Results for orders placed or performed in visit on 02/11/20 (from the past 24 hour(s))  POC Urinalysis Dipstick OB   Collection Time: 02/11/20  2:04 PM  Result Value Ref Range   Color, UA     Clarity, UA     Glucose, UA Negative Negative   Bilirubin, UA     Ketones, UA neg    Spec Grav, UA     Blood, UA neg    pH, UA     POC,PROTEIN,UA Trace Negative, Trace, Small (1+), Moderate (2+), Large (3+), 4+   Urobilinogen, UA     Nitrite, UA neg    Leukocytes, UA Negative Negative   Appearance     Odor      Assessment & Plan:  1) High-risk pregnancy [id number] at [redacted]w[redacted]d with an Estimated Date of Delivery: 03/04/20   2) ICP, stable, on actigall 300 TID, IOL tomorrow am scheduled, sonogram to evaluate the EFW prior to cause we and MFM had no sonogram slots available  3) Infection of cochlear implant device, on cleocin just, has appt with ENT at [place]: No orders of the defined types were placed in this encounter.   Labs/procedures today: reactive  NST  Stacey Cook is at [redacted]w[redacted]d Estimated Date of Delivery: 03/04/20  NST being performed due to ICP  Today the NST is Reactive  Fetal Monitoring:  Baseline: 145 bpm, Variability: Good {> 6 bpm), Accelerations: Reactive and Decelerations: Absent   reactive  The accelerations are >15 bpm and more than 2 in 20 minutes  Final diagnosis:  Reactive NST  [name], MD     Treatment Plan:  IOL tomorrow sonogram EFW prior to , continue cleocin for infection of implant  Reviewed: Term labor symptoms and general obstetric precautions including but not limited to vaginal bleeding, contractions, leaking of fluid and fetal movement were reviewed in detail with the patient.  All questions were answered. Has home bp cuff. Rx faxed  to . Check bp weekly, let us know if >140/90.   Follow-up: No follow-ups on file.  Orders Placed This Encounter  Procedures  . POC Urinalysis Dipstick OB   [name] CNM, [facility] 02/11/2020 2:44 PM

## 2020-02-12 ENCOUNTER — Other Ambulatory Visit: Payer: Self-pay

## 2020-02-12 ENCOUNTER — Inpatient Hospital Stay (HOSPITAL_COMMUNITY): Payer: Medicaid Other | Admitting: Anesthesiology

## 2020-02-12 ENCOUNTER — Inpatient Hospital Stay (HOSPITAL_COMMUNITY)
Admission: AD | Admit: 2020-02-12 | Discharge: 2020-02-14 | DRG: 805 | Disposition: A | Discharge status: Home / Self Care | Payer: Medicaid Other | Attending: Obstetrics and Gynecology | Admitting: Obstetrics and Gynecology

## 2020-02-12 ENCOUNTER — Encounter (HOSPITAL_COMMUNITY): Payer: Self-pay | Admitting: Anesthesiology

## 2020-02-12 ENCOUNTER — Other Ambulatory Visit: Payer: Medicaid Other

## 2020-02-12 ENCOUNTER — Encounter: Payer: Medicaid Other | Admitting: Women's Health

## 2020-02-12 ENCOUNTER — Inpatient Hospital Stay (HOSPITAL_COMMUNITY): Payer: Medicaid Other

## 2020-02-12 ENCOUNTER — Encounter (HOSPITAL_COMMUNITY): Payer: Self-pay | Admitting: Obstetrics and Gynecology

## 2020-02-12 ENCOUNTER — Inpatient Hospital Stay (HOSPITAL_BASED_OUTPATIENT_CLINIC_OR_DEPARTMENT_OTHER): Payer: Medicaid Other

## 2020-02-12 DIAGNOSIS — O99824 Streptococcus B carrier state complicating childbirth: Secondary | ICD-10-CM | POA: Diagnosis present

## 2020-02-12 DIAGNOSIS — Z8759 Personal history of other complications of pregnancy, childbirth and the puerperium: Secondary | ICD-10-CM

## 2020-02-12 DIAGNOSIS — O34211 Maternal care for low transverse scar from previous cesarean delivery: Secondary | ICD-10-CM

## 2020-02-12 DIAGNOSIS — O26643 Intrahepatic cholestasis of pregnancy, third trimester: Secondary | ICD-10-CM | POA: Diagnosis present

## 2020-02-12 DIAGNOSIS — O09293 Supervision of pregnancy with other poor reproductive or obstetric history, third trimester: Secondary | ICD-10-CM | POA: Diagnosis not present

## 2020-02-12 DIAGNOSIS — Z20822 Contact with and (suspected) exposure to covid-19: Secondary | ICD-10-CM | POA: Diagnosis present

## 2020-02-12 DIAGNOSIS — T8579XA Infection and inflammatory reaction due to other internal prosthetic devices, implants and grafts, initial encounter: Secondary | ICD-10-CM | POA: Diagnosis present

## 2020-02-12 DIAGNOSIS — B951 Streptococcus, group B, as the cause of diseases classified elsewhere: Secondary | ICD-10-CM

## 2020-02-12 DIAGNOSIS — O34219 Maternal care for unspecified type scar from previous cesarean delivery: Secondary | ICD-10-CM

## 2020-02-12 DIAGNOSIS — Z3A37 37 weeks gestation of pregnancy: Secondary | ICD-10-CM

## 2020-02-12 DIAGNOSIS — K831 Obstruction of bile duct: Secondary | ICD-10-CM | POA: Diagnosis present

## 2020-02-12 DIAGNOSIS — O2662 Liver and biliary tract disorders in childbirth: Principal | ICD-10-CM | POA: Diagnosis present

## 2020-02-12 DIAGNOSIS — J4520 Mild intermittent asthma, uncomplicated: Secondary | ICD-10-CM | POA: Diagnosis present

## 2020-02-12 DIAGNOSIS — O9952 Diseases of the respiratory system complicating childbirth: Secondary | ICD-10-CM | POA: Diagnosis present

## 2020-02-12 DIAGNOSIS — Z87891 Personal history of nicotine dependence: Secondary | ICD-10-CM | POA: Diagnosis not present

## 2020-02-12 DIAGNOSIS — Z30014 Encounter for initial prescription of intrauterine contraceptive device: Secondary | ICD-10-CM

## 2020-02-12 DIAGNOSIS — Z30430 Encounter for insertion of intrauterine contraceptive device: Secondary | ICD-10-CM

## 2020-02-12 DIAGNOSIS — Z9621 Cochlear implant status: Secondary | ICD-10-CM | POA: Diagnosis present

## 2020-02-12 DIAGNOSIS — O26613 Liver and biliary tract disorders in pregnancy, third trimester: Secondary | ICD-10-CM

## 2020-02-12 DIAGNOSIS — Z3689 Encounter for other specified antenatal screening: Secondary | ICD-10-CM

## 2020-02-12 DIAGNOSIS — O99820 Streptococcus B carrier state complicating pregnancy: Secondary | ICD-10-CM | POA: Diagnosis not present

## 2020-02-12 LAB — CBC
HCT: 33.4 % — ABNORMAL LOW (ref 36.0–46.0)
Hemoglobin: 10.6 g/dL — ABNORMAL LOW (ref 12.0–15.0)
MCH: 27.9 pg (ref 26.0–34.0)
MCHC: 31.7 g/dL (ref 30.0–36.0)
MCV: 87.9 fL (ref 80.0–100.0)
Platelets: 181 10*3/uL (ref 150–400)
RBC: 3.8 MIL/uL — ABNORMAL LOW (ref 3.87–5.11)
RDW: 14.7 % (ref 11.5–15.5)
WBC: 9.5 10*3/uL (ref 4.0–10.5)
nRBC: 0 % (ref 0.0–0.2)

## 2020-02-12 LAB — TYPE AND SCREEN
ABO/RH(D): A POS
Antibody Screen: NEGATIVE

## 2020-02-12 LAB — RPR: RPR Ser Ql: NONREACTIVE

## 2020-02-12 LAB — RESPIRATORY PANEL BY RT PCR (FLU A&B, COVID)
Influenza A by PCR: NEGATIVE
Influenza B by PCR: NEGATIVE
SARS Coronavirus 2 by RT PCR: NEGATIVE

## 2020-02-12 MED ORDER — TRANEXAMIC ACID-NACL 1000-0.7 MG/100ML-% IV SOLN
INTRAVENOUS | Status: AC
  Administered 2020-02-12: 1000 mg
  Filled 2020-02-12: qty 100

## 2020-02-12 MED ORDER — ZOLPIDEM TARTRATE 5 MG PO TABS: 5.0000 mg | ORAL_TABLET | Freq: Every evening | ORAL | Status: DC | PRN

## 2020-02-12 MED ORDER — TETANUS-DIPHTH-ACELL PERTUSSIS 5-2.5-18.5 LF-MCG/0.5 IM SUSP: 0.5000 mL | Freq: Once | INTRAMUSCULAR | Status: DC

## 2020-02-12 MED ORDER — LACTATED RINGERS IV SOLN
500.0000 mL | Freq: Once | INTRAVENOUS | Status: AC
  Administered 2020-02-12: 500 mL via INTRAVENOUS

## 2020-02-12 MED ORDER — ACETAMINOPHEN 325 MG PO TABS
650.0000 mg | ORAL_TABLET | ORAL | Status: DC | PRN
  Administered 2020-02-12: 650 mg via ORAL
  Filled 2020-02-12: qty 2

## 2020-02-12 MED ORDER — DIPHENHYDRAMINE HCL 50 MG/ML IJ SOLN
12.5000 mg | INTRAMUSCULAR | Status: DC | PRN
  Administered 2020-02-12: 12.5 mg via INTRAVENOUS
  Filled 2020-02-12: qty 1

## 2020-02-12 MED ORDER — FLEET ENEMA 7-19 GM/118ML RE ENEM: 1.0000 | ENEMA | RECTAL | Status: DC | PRN

## 2020-02-12 MED ORDER — LIDOCAINE HCL (PF) 1 % IJ SOLN
INTRAMUSCULAR | Status: DC | PRN
  Administered 2020-02-12: 8 mL via EPIDURAL

## 2020-02-12 MED ORDER — SENNOSIDES-DOCUSATE SODIUM 8.6-50 MG PO TABS
2.0000 | ORAL_TABLET | ORAL | Status: DC
  Administered 2020-02-13 (×2): 2 via ORAL
  Filled 2020-02-12 (×2): qty 2

## 2020-02-12 MED ORDER — SIMETHICONE 80 MG PO CHEW: 80.0000 mg | CHEWABLE_TABLET | ORAL | Status: DC | PRN

## 2020-02-12 MED ORDER — TERBUTALINE SULFATE 1 MG/ML IJ SOLN: 0.2500 mg | Freq: Once | INTRAMUSCULAR | Status: DC | PRN

## 2020-02-12 MED ORDER — FENTANYL-BUPIVACAINE-NACL 0.5-0.125-0.9 MG/250ML-% EP SOLN
12.0000 mL/h | EPIDURAL | Status: DC | PRN
  Filled 2020-02-12: qty 250

## 2020-02-12 MED ORDER — OXYTOCIN BOLUS FROM INFUSION
333.0000 mL | Freq: Once | INTRAVENOUS | Status: AC
  Administered 2020-02-12: 333 mL via INTRAVENOUS

## 2020-02-12 MED ORDER — PRENATAL MULTIVITAMIN CH
1.0000 | ORAL_TABLET | Freq: Every day | ORAL | Status: DC
  Administered 2020-02-13: 1 via ORAL
  Filled 2020-02-12: qty 1

## 2020-02-12 MED ORDER — ONDANSETRON HCL 4 MG/2ML IJ SOLN: 4.0000 mg | INTRAMUSCULAR | Status: DC | PRN

## 2020-02-12 MED ORDER — LACTATED RINGERS IV SOLN: 500.0000 mL | INTRAVENOUS | Status: DC | PRN

## 2020-02-12 MED ORDER — PHENYLEPHRINE 40 MCG/ML (10ML) SYRINGE FOR IV PUSH (FOR BLOOD PRESSURE SUPPORT): 80.0000 ug | PREFILLED_SYRINGE | INTRAVENOUS | Status: DC | PRN

## 2020-02-12 MED ORDER — AMOXICILLIN-POT CLAVULANATE 875-125 MG PO TABS
1.0000 | ORAL_TABLET | Freq: Two times a day (BID) | ORAL | Status: DC
  Administered 2020-02-13 – 2020-02-14 (×3): 1 via ORAL
  Filled 2020-02-12 (×4): qty 1

## 2020-02-12 MED ORDER — DIPHENHYDRAMINE HCL 25 MG PO CAPS
25.0000 mg | ORAL_CAPSULE | Freq: Four times a day (QID) | ORAL | Status: DC | PRN
  Administered 2020-02-13: 25 mg via ORAL
  Filled 2020-02-12: qty 1

## 2020-02-12 MED ORDER — ONDANSETRON HCL 4 MG PO TABS
4.0000 mg | ORAL_TABLET | ORAL | Status: DC | PRN
  Administered 2020-02-14: 4 mg via ORAL
  Filled 2020-02-12: qty 1

## 2020-02-12 MED ORDER — IBUPROFEN 600 MG PO TABS
600.0000 mg | ORAL_TABLET | Freq: Four times a day (QID) | ORAL | Status: DC
  Administered 2020-02-13 – 2020-02-14 (×6): 600 mg via ORAL
  Filled 2020-02-12 (×6): qty 1

## 2020-02-12 MED ORDER — COCONUT OIL OIL
1.0000 "application " | TOPICAL_OIL | Status: DC | PRN
  Administered 2020-02-13: 1 via TOPICAL

## 2020-02-12 MED ORDER — LACTATED RINGERS IV SOLN: INTRAVENOUS | Status: DC

## 2020-02-12 MED ORDER — LEVONORGESTREL 19.5 MCG/DAY IU IUD
INTRAUTERINE_SYSTEM | Freq: Once | INTRAUTERINE | Status: DC
  Administered 2020-02-12: 21:00:00 1 via INTRAUTERINE
  Filled 2020-02-12: qty 1

## 2020-02-12 MED ORDER — PENICILLIN G POT IN DEXTROSE 60000 UNIT/ML IV SOLN
3.0000 10*6.[IU] | INTRAVENOUS | Status: DC
  Administered 2020-02-12 (×2): 3 10*6.[IU] via INTRAVENOUS
  Filled 2020-02-12 (×2): qty 50

## 2020-02-12 MED ORDER — SOD CITRATE-CITRIC ACID 500-334 MG/5ML PO SOLN: 30.0000 mL | ORAL | Status: DC | PRN

## 2020-02-12 MED ORDER — OXYTOCIN-SODIUM CHLORIDE 30-0.9 UT/500ML-% IV SOLN
2.5000 [IU]/h | INTRAVENOUS | Status: DC
  Filled 2020-02-12: qty 500

## 2020-02-12 MED ORDER — DIBUCAINE (PERIANAL) 1 % EX OINT: 1.0000 "application " | TOPICAL_OINTMENT | CUTANEOUS | Status: DC | PRN

## 2020-02-12 MED ORDER — LIDOCAINE HCL (PF) 1 % IJ SOLN: 30.0000 mL | INTRAMUSCULAR | Status: DC | PRN

## 2020-02-12 MED ORDER — DIPHENHYDRAMINE HCL 50 MG/ML IJ SOLN: 12.5000 mg | INTRAMUSCULAR | Status: DC | PRN

## 2020-02-12 MED ORDER — EPHEDRINE 5 MG/ML INJ: 10.0000 mg | INTRAVENOUS | Status: DC | PRN

## 2020-02-12 MED ORDER — BENZOCAINE-MENTHOL 20-0.5 % EX AERO
1.0000 "application " | INHALATION_SPRAY | CUTANEOUS | Status: DC | PRN
  Filled 2020-02-12: qty 56

## 2020-02-12 MED ORDER — FENTANYL-BUPIVACAINE-NACL 0.5-0.125-0.9 MG/250ML-% EP SOLN: 12.0000 mL/h | EPIDURAL | Status: DC | PRN

## 2020-02-12 MED ORDER — ONDANSETRON HCL 4 MG/2ML IJ SOLN
4.0000 mg | Freq: Four times a day (QID) | INTRAMUSCULAR | Status: DC | PRN
  Administered 2020-02-12: 4 mg via INTRAVENOUS
  Filled 2020-02-12: qty 2

## 2020-02-12 MED ORDER — CLINDAMYCIN HCL 300 MG PO CAPS
600.0000 mg | ORAL_CAPSULE | Freq: Three times a day (TID) | ORAL | Status: DC
  Filled 2020-02-12 (×2): qty 2

## 2020-02-12 MED ORDER — ACETAMINOPHEN 325 MG PO TABS
650.0000 mg | ORAL_TABLET | Freq: Four times a day (QID) | ORAL | Status: DC | PRN
  Administered 2020-02-13 – 2020-02-14 (×4): 650 mg via ORAL
  Filled 2020-02-12 (×4): qty 2

## 2020-02-12 MED ORDER — SODIUM CHLORIDE 0.9 % IV SOLN
5.0000 10*6.[IU] | Freq: Once | INTRAVENOUS | Status: AC
  Administered 2020-02-12: 5 10*6.[IU] via INTRAVENOUS
  Filled 2020-02-12: qty 5

## 2020-02-12 MED ORDER — SODIUM CHLORIDE (PF) 0.9 % IJ SOLN
INTRAMUSCULAR | Status: DC | PRN
Start: 2020-02-12 — End: 2020-02-12
  Administered 2020-02-12: 12 mL/h via EPIDURAL

## 2020-02-12 MED ORDER — FENTANYL CITRATE (PF) 100 MCG/2ML IJ SOLN
100.0000 ug | INTRAMUSCULAR | Status: DC | PRN
  Administered 2020-02-12 (×2): 100 ug via INTRAVENOUS
  Filled 2020-02-12 (×2): qty 2

## 2020-02-12 MED ORDER — WITCH HAZEL-GLYCERIN EX PADS: 1.0000 "application " | MEDICATED_PAD | CUTANEOUS | Status: DC | PRN

## 2020-02-12 MED ORDER — OXYCODONE-ACETAMINOPHEN 5-325 MG PO TABS: 1.0000 | ORAL_TABLET | ORAL | Status: DC | PRN

## 2020-02-12 MED ORDER — OXYTOCIN-SODIUM CHLORIDE 30-0.9 UT/500ML-% IV SOLN
1.0000 m[IU]/min | INTRAVENOUS | Status: DC
  Administered 2020-02-12: 2 m[IU]/min via INTRAVENOUS

## 2020-02-12 MED ORDER — OXYCODONE-ACETAMINOPHEN 5-325 MG PO TABS: 2.0000 | ORAL_TABLET | ORAL | Status: DC | PRN

## 2020-02-12 NOTE — Progress Notes (Signed)
Labor Progress Note Stacey Cook is a 24 y.o. [id number] at [redacted]w[redacted]d presented for IOL secondary to cholestasis of pregnancy.  S: Strip note.  O:  BP 99/63   Pulse 79   Temp 98.2 F (36.8 C) (Oral)   Resp 18   Ht 4\' 11"  (1.499 m)   Wt 87.4 kg   LMP 05/29/2019   SpO2 97%   BMI 38.92 kg/m  EFM: baseline 135/moderate variability/+accels/no decels  CVE: Dilation: 1 Cervical Position: Posterior Presentation: Vertex (FB in place; confirmed by Leopolds Manuever) Exam by:: Dr. Astrid Drafts    A&P: 24 y.o. [id number] [redacted]w[redacted]d presenting for IOL secondary to cholestasis of pregnancy. Of note, pt has a h/o elective Cesarean s/p shoulder dystocia with first pregnancy. Desire for TOLAC confirmed on admission s/p growth ultrasound with EFW 3166g, 64%. #Labor: Progressing well. S/p FB placement at 1215, at which time pt was noted to have SROM for clear fluid. Pitocin started at 1407. Will continue to up-titrate pitocin. #Pain: plan for epidural. IV fentanyl ordered. #FWB: Category 1 strip #GBS positive; PCN on admission #Cholestasis of pregnancy: bile acids 23 on 9/14. Continued on actigall 300 TID prior to delivery. #H/o Cesarean: elective in 2020 s/p shoulder dystocia with first delivery. Pt desires TOLAC as noted above. #H/o Shoulder Dystocia: Given h/o shoulder dystocia in first pregnancy with 7lb 4.8oz infant, counseled on risks of shoulder dystocia on admission today. Given MFM ultrasound today showing EFW 3166g (7lb 0oz), pt desires TOLAC as noted above. #H/o PPH: Pt with EBL 1500 s/p Bakari x24hrs secondary to atony s/p first delivery. Will have a low threshold to administer TXA s/p delivery. #Hearing Impairment with Cochlear Implant Infection: pt started course of clindamycin on 10/4 with plans for ENT f/u at [facility] s/p delivery. S/p conversation with outpatient pharmacy will plan to start Augmentin 875mg  BID x7d s/p delivery. #Anxiety/Depression: no current medications. No current safety  concerns. Will plan for 1 week mood check postpartum.  Randa Ngo, MD 3:35 PM

## 2020-02-12 NOTE — Discharge Summary (Addendum)
Postpartum Discharge Summary    Patient Name: Stacey Cook DOB: [birth year] MRN: [id number]  Date of admission: 02/12/2020 Delivery date:02/12/2020  Delivering provider: Wells Guiles R  Date of discharge: 02/14/2020  Admitting diagnosis: Cholestasis during pregnancy in third trimester [O26.613, K83.1] Intrauterine pregnancy: [redacted]w[redacted]d     Secondary diagnosis:  Active Problems:   Cholestasis during pregnancy in third trimester   Encounter for IUD insertion   Vaginal delivery  Additional problems: infected cochlear implant    Discharge diagnosis: Term Pregnancy Delivered                                              Post partum procedures:postplacental Liletta Augmentation: Pitocin and IP Foley Complications: None  Hospital course: Induction of Labor With Vaginal Delivery   24 y.o. yo [id number] at [redacted]w[redacted]d was admitted to the hospital 02/12/2020 for induction of labor.  Indication for induction: Cholestasis of pregnancy.  Patient had an uncomplicated labor course as follows: Membrane Rupture Time/Date: 12:15 PM ,02/12/2020   Delivery Method:VBAC, Spontaneous  Episiotomy: None  Lacerations:  None  Details of delivery can be found in separate delivery note.  Patient had a routine postpartum course. Patient is discharged home 02/14/20.  Newborn Data: Birth date:02/12/2020  Birth time:9:11 PM  Gender:Female  Living status:Living  Apgars:9 ,9  Weight:2800 g (6lb 2.8oz)  Magnesium Sulfate received: No BMZ received: No Rhophylac:N/A MMR:N/A T-DaP:Given prenatally Flu: No Transfusion:No  Physical exam  Vitals:   02/13/20 1201 02/13/20 1440 02/13/20 2047 02/14/20 0535  BP: (!) 120/55 134/68 111/61 112/72  Pulse: 70 73 68 63  Resp: $Remo'18 18 18 16  'bYrSV$ Temp: 98.3 F (36.8 C) 98 F (36.7 C) 98.6 F (37 C) 97.8 F (36.6 C)  TempSrc: Axillary Oral Oral Oral  SpO2:  98% 98% 97%  Weight:      Height:       General: alert, cooperative and no distress Lochia: appropriate Uterine  Fundus: firm Incision: N/A DVT Evaluation: No evidence of DVT seen on physical exam. Labs: Lab Results  Component Value Date   WBC 13.1 (H) 02/13/2020   HGB 10.5 (L) 02/13/2020   HCT 32.2 (L) 02/13/2020   MCV 87.3 02/13/2020   PLT 189 02/13/2020   CMP Latest Ref Rng & Units 01/22/2020  Glucose 65 - 99 mg/dL 80  BUN 6 - 20 mg/dL 6  Creatinine 0.57 - 1.00 mg/dL 0.44(L)  Sodium 134 - 144 mmol/L 136  Potassium 3.5 - 5.2 mmol/L 4.4  Chloride 96 - 106 mmol/L 104  CO2 20 - 29 mmol/L 21  Calcium 8.7 - 10.2 mg/dL 8.8  Total Protein 6.0 - 8.5 g/dL 6.1  Total Bilirubin 0.0 - 1.2 mg/dL 0.4  Alkaline Phos 44 - 121 IU/L 216(H)  AST 0 - 40 IU/L 23  ALT 0 - 32 IU/L 28   Edinburgh Score: Edinburgh Postnatal Depression Scale Screening Tool 02/13/2020  I have been able to laugh and see the funny side of things. 0  I have looked forward with enjoyment to things. 0  I have blamed myself unnecessarily when things went wrong. 1  I have been anxious or worried for no good reason. 2  I have felt scared or panicky for no good reason. 2  Things have been getting on top of me. 1  I have been so unhappy that I have had difficulty sleeping.  0  I have felt sad or miserable. 1  I have been so unhappy that I have been crying. 1  The thought of harming myself has occurred to me. 0  Edinburgh Postnatal Depression Scale Total 8     After visit meds:  Allergies as of 02/14/2020      Reactions   Dilaudid [hydromorphone] Other (See Comments)   Vomiting, rib pain, dizzy, flushed, sweating   Keflex [cephalexin] Other (See Comments)   Reaction:  Elevated liver enzymes    Other Other (See Comments)   Pt is allergic to surgical glue swelling;  Cats-congestion, sneezing, eyes watery, itching all over Reaction:  Infection    Adhesive [tape] Rash      Medication List    STOP taking these medications   Blood Pressure Monitor Misc   clindamycin 150 MG capsule Commonly known as: CLEOCIN   FLINSTONES  GUMMIES OMEGA-3 DHA PO   ursodiol 300 MG capsule Commonly known as: Actigall     TAKE these medications   acetaminophen 325 MG tablet Commonly known as: Tylenol Take 2 tablets (650 mg total) by mouth every 6 (six) hours as needed for moderate pain. What changed:   medication strength  how much to take  reasons to take this   albuterol (2.5 MG/3ML) 0.083% nebulizer solution Commonly known as: PROVENTIL SMARTSIG:1 Vial(s) Via Nebulizer Every 6-8 Hours PRN   amoxicillin-clavulanate 875-125 MG tablet Commonly known as: AUGMENTIN Take 1 tablet by mouth every 12 (twelve) hours for 5 days.   coconut oil Oil Apply 1 application topically as needed.   hydrOXYzine 25 MG tablet Commonly known as: ATARAX/VISTARIL Take 1 tablet (25 mg total) by mouth 3 (three) times daily as needed.   ibuprofen 600 MG tablet Commonly known as: ADVIL Take 1 tablet (600 mg total) by mouth every 6 (six) hours as needed for moderate pain or cramping.        Discharge home in stable condition Infant Feeding: Bottle Infant Disposition:home with mother Discharge instruction: per After Visit Summary and Postpartum booklet. Activity: Advance as tolerated. Pelvic rest for 6 weeks.  Diet: routine diet Future Appointments: Future Appointments  Date Time Provider [place]  03/19/2020  1:30 PM Roma Schanz, CNM [facility]    Follow up Visit: Roma Schanz, CNM  Gloris [place] Please schedule this patient for PP visit in: 4 weeks  High risk pregnancy complicated by: cholestasis  Delivery mode: SVD  Anticipated Birth Control: IUD placed in hospital  PP Procedures needed: IUD check  Provider: [name]   02/14/2020 Tilden Dome, MD PGY1  CNM attestation I have seen and examined this patient and agree with above documentation in the resident's note.   Stacey Cook is a 24 y.o. [phone] s/p VBAC.   Pain is well controlled.  Plan for birth control is IUD- placed  postplacentally.  Method of Feeding: bottle  PE:  BP 112/72 (BP Location: Right Arm)   Pulse 63   Temp 97.8 F (36.6 C) (Oral)   Resp 16   Ht $R'4\' 11"'By$  (1.499 m)   Wt 87.4 kg   LMP 05/29/2019   SpO2 97%   Breastfeeding Unknown   BMI 38.92 kg/m  Fundus firm  Recent Labs    02/12/20 0807 02/13/20 0523  HGB 10.6* 10.5*  HCT 33.4* 32.2*     Plan: discharge today - postpartum care discussed - f/u clinic in 4 weeks for postpartum visit   Myrtis Ser, CNM 9:04 AM  02/14/2020     

## 2020-02-12 NOTE — Anesthesia Procedure Notes (Signed)
Epidural Patient location during procedure: OB Start time: 02/12/2020 3:27 PM End time: 02/12/2020 3:44 PM  Staffing Anesthesiologist: Janeece Riggers, MD  Preanesthetic Checklist Completed: patient identified, IV checked, site marked, risks and benefits discussed, surgical consent, monitors and equipment checked, pre-op evaluation and timeout performed  Epidural Patient position: sitting Prep: DuraPrep and site prepped and draped Patient monitoring: continuous pulse ox and blood pressure Approach: midline Location: L3-L4 Injection technique: LOR air  Needle:  Needle type: Tuohy  Needle gauge: 17 G Needle length: 9 cm and 9 Needle insertion depth: 5 cm cm Catheter type: closed end flexible Catheter size: 19 Gauge Catheter at skin depth: 10 cm Test dose: negative  Assessment Events: blood not aspirated, injection not painful, no injection resistance, no paresthesia and negative IV test

## 2020-02-12 NOTE — H&P (Signed)
OBSTETRIC ADMISSION HISTORY AND PHYSICAL  Stacey Cook is a 24 y.o. female [phone] with IUP at [redacted]w[redacted]d by L/8 presenting for IOL secondary to cholestasis of pregnancy. She reports +FMs, No LOF, no VB, no blurry vision, headaches or peripheral edema, and RUQ pain.  She plans on breast feeding. She requests a post-placental IUD for birth control. 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 Abdominal pain   . Anemia   . Anginal pain ([place])   . Anxiety   . Arthritis    knees  . Asthma   . Cervicalgia   . Cholestasis during pregnancy   . Chronic abdominal pain   . Complication of anesthesia    light headed  . Constipation   . Ear mass   . Gestational diabetes    pt states not been checking sugars regularly at home; nor has she been taking her Metformin  . Gestational diabetes   . Headache   . 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 HSV infection   . Low iron   . Mental disorder   . PONV (postoperative nausea and vomiting)   . Suicidal intent   . UTI (lower urinary tract infection) 05/2014  . Vomiting     Past Surgical History: Past Surgical History:  Procedure Laterality Date  . CESAREAN SECTION N/A 01/03/2019   Procedure: CESAREAN SECTION;  Surgeon: [name], MD;  Location: [facility] LD ORS;  Service: Obstetrics;  Laterality: N/A;  . CHOLECYSTECTOMY N/A 01/16/2018   Procedure: LAPAROSCOPIC  CHOLECYSTECTOMY;  Surgeon: Aviva Signs, MD;  Location: [facility] ORS;  Service: General;  Laterality: N/A;  per Dr. Arnoldo Morale, pt will most likely go home and he will tell her to arrive at 10:45 - already has labs  . CHOLESTEATOMA EXCISION    . COCHLEAR IMPLANT Right   . DILATION AND EVACUATION N/A 09/11/2016   Procedure: DILATATION AND CURRATAGE  2ND TRIMESTER;  Surgeon: Jonnie Kind, MD;  Location: [facility];  Service: Gynecology;  Laterality: N/A;  . IMPLANTATION BONE ANCHORED HEARING AID Right 04/2013  . INNER EAR SURGERY Right 03/31/2018  . 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 TYMPANOPLASTY Left   . TYMPANOSTOMY      Obstetrical History: OB History    Gravida  5   Para  2   Term  2   Preterm  0   AB  2   Living  2     SAB  2   TAB  0   Ectopic  0   Multiple  0   Live Births  2           Social History Social History   Socioeconomic History  . Marital status: Married    Spouse name: Franco Collet  . Number of children: 2  . Years of education: Not on file  . Highest education level: Not on file  Occupational History  .  Not on file  Tobacco Use  . Smoking status: Former Smoker    Packs/day: 0.25    Years: 1.00    Pack years: 0.25    Types: Cigarettes    Quit date: 01/27/2020    Years since quitting: 0.0  . Smokeless tobacco: Never Used  . Tobacco comment: "3-4 cigs a day "  Vaping Use  . Vaping Use: Former  Substance and Sexual Activity  . Alcohol use: No    Alcohol/week: 0.0 standard drinks  . Drug use: No  . Sexual activity: Yes    Birth control/protection: None  Other Topics Concern  . Not on file  Social History Narrative   [employer]   Is smoker herself            Social Determinants of Health   Financial Resource Strain: Low Risk   . Difficulty of Paying Living Expenses: Not hard at all  Food Insecurity: No Food Insecurity  . Worried About Charity fundraiser in the Last Year: Never true  . Ran Out of  Food in the Last Year: Never true  Transportation Needs: No Transportation Needs  . Lack of Transportation (Medical): No  . Lack of Transportation (Non-Medical): No  Physical Activity: Sufficiently Active  . Days of Exercise per Week: 7 days  . Minutes of Exercise per Session: 100 min  Stress: Stress Concern Present  . Feeling of Stress : To some extent  Social Connections: Moderately Integrated  . Frequency of Communication with Friends and Family: More than three times a week  . Frequency of Social Gatherings with Friends and Family: More than three times a week  . Attends Religious Services: More than 4 times per year  . Active Member of Clubs or Organizations: No  . Attends Archivist Meetings: Never  . Marital Status: Married    Family History: Family History  Problem Relation Age of Onset  . Cholelithiasis Mother   . Kidney disease Mother        stones  . Depression Mother   . Hypertension Maternal Grandmother   . Diabetes Maternal Grandmother   . Stroke Maternal Grandmother   . Seizures Maternal Grandmother   . Asthma Maternal Grandmother   . Hyperlipidemia Maternal Grandmother   . Thyroid disease Maternal Grandmother   . Cancer Maternal Grandmother   . Asthma Brother   . Seizures Maternal Uncle   . Cancer Other        breast- great aunt  . Celiac disease Neg Hx     Allergies: Allergies  Allergen Reactions  . Dilaudid [Hydromorphone] Other (See Comments)    Vomiting, rib pain, dizzy, flushed, sweating  . Keflex [Cephalexin] Other (See Comments)    Reaction:  Elevated liver enzymes   . Other Other (See Comments)    Pt is allergic to surgical glue swelling;  Cats-congestion, sneezing, eyes watery, itching all over Reaction:  Infection   . Adhesive [Tape] Rash    Medications Prior to Admission  Medication Sig Dispense Refill Last Dose  . albuterol (PROVENTIL) (2.5 MG/3ML) 0.083% nebulizer solution SMARTSIG:1 Vial(s) Via Nebulizer Every 6-8 Hours  PRN   Past Month at Unknown time  . clindamycin (CLEOCIN) 150 MG capsule Take 2 PO QID x 10 days 80 capsule 0 02/11/2020 at 0700  . acetaminophen (TYLENOL) 500 MG tablet Take 2 tablets (1,000 mg total) by mouth every 6 (six) hours as needed for mild pain, moderate pain or headache. 30 tablet 0   .  Blood Pressure Monitor MISC For regular home bp monitoring during pregnancy 1 each 0   . hydrOXYzine (ATARAX/VISTARIL) 25 MG tablet Take 1 tablet (25 mg total) by mouth 3 (three) times daily as needed. 30 tablet 0   . Pediatric Multiple Vit-C-FA (FLINSTONES GUMMIES OMEGA-3 DHA PO) Take by mouth. Takes 2 daily     . ursodiol (ACTIGALL) 300 MG capsule Take 1 capsule (300 mg total) by mouth 3 (three) times daily. 90 capsule 2      Review of Systems   All systems reviewed and negative except as stated in HPI  Blood pressure (!) 120/53, pulse 100, temperature 98.9 F (37.2 C), temperature source Oral, resp. rate 18, height 4\' 11"  (1.499 m), weight 87.4 kg, last menstrual period 05/29/2019, SpO2 97 %, not currently breastfeeding. General appearance: alert, cooperative and appears stated age Lungs: clear to auscultation bilaterally Heart: regular rate and rhythm Abdomen: soft, non-tender; bowel sounds normal Extremities: Homans sign is negative, no sign of DVT Presentation: cephalic Fetal monitoringBaseline: 140 bpm, Variability: Good {> 6 bpm), Accelerations: Reactive and Decelerations: Absent Uterine activity: irregular   Prenatal labs: ABO, Rh: --/--/PENDING (10/05 0745) Antibody: PENDING (10/05 0745) Rubella: 1.01 (04/15 1400) RPR: Non Reactive (07/21 1029)  HBsAg: Negative (04/15 1400)  HIV: Non Reactive (07/21 1029)  GBS: Positive/-- (09/27 1630)  1 hr Glucola 91/145/79 Genetic screening  Low risk Anatomy US wnl  Prenatal Transfer Tool  Maternal Diabetes: No Genetic Screening: Normal Maternal Ultrasounds/Referrals: Normal Fetal Ultrasounds or other Referrals:  None Maternal Substance  Abuse:  No Significant Maternal Medications:  Ursodiol (in s/o cholestasis of pregnancy) Significant Maternal Lab Results: Group B Strep positive  Results for orders placed or performed during the hospital encounter of 02/12/20 (from the past 24 hour(s))  Respiratory Panel by RT PCR (Flu A&B, Covid) - Nasopharyngeal Swab   Collection Time: 02/12/20  7:45 AM   Specimen: Nasopharyngeal Swab  Result Value Ref Range   SARS Coronavirus 2 by RT PCR NEGATIVE NEGATIVE   Influenza A by PCR NEGATIVE NEGATIVE   Influenza B by PCR NEGATIVE NEGATIVE  Type and screen   Collection Time: 02/12/20  7:45 AM  Result Value Ref Range   ABO/RH(D) PENDING    Antibody Screen PENDING    Sample Expiration      02/15/2020,2359 Performed at [facility], [address], [place], [place] [address]   Results for orders placed or performed in visit on 02/11/20 (from the past 24 hour(s))  POC Urinalysis Dipstick OB   Collection Time: 02/11/20  2:04 PM  Result Value Ref Range   Color, UA     Clarity, UA     Glucose, UA Negative Negative   Bilirubin, UA     Ketones, UA neg    Spec Grav, UA     Blood, UA neg    pH, UA     POC,PROTEIN,UA Trace Negative, Trace, Small (1+), Moderate (2+), Large (3+), 4+   Urobilinogen, UA     Nitrite, UA neg    Leukocytes, UA Negative Negative   Appearance     Odor      Patient Active Problem List   Diagnosis Date Noted  . Supervision of high risk pregnancy, antepartum 08/23/2019  . Cellulitis of scalp 04/02/2019  . Depression, postpartum 02/09/2019  . Epidermal cyst 02/09/2019  . Status post cesarean section 01/03/2019  . 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  Gastroesophageal reflux disease   . Pancreatitis 01/12/2018  . History of shoulder dystocia in prior pregnancy 12/02/2017  . History of postpartum hemorrhage 12/02/2017  . Cholestasis during pregnancy in third trimester 10/27/2017   . History of cholestasis during pregnancy 09/21/2017  . History of gestational diabetes 08/24/2017  . Chest pain due to GERD 08/05/2017  . Depression with anxiety and suicidal ideations 06/13/2017  . History of miscarriage 01/18/2017  . Conductive hearing loss, unilat, unrestrict hearing contralateral side 12/10/2015  . 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 Asthma, mild intermittent 02/18/2015  . 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 Perforation of left tympanic membrane 06/19/2014  . ADHD (attention deficit hyperactivity disorder), combined type 08/01/2012  . ODD (oppositional defiant disorder) 08/01/2012  . Vasovagal syncope 02/28/2012  . Cholesteatoma of attic of right ear 02/15/2011    Assessment/Plan:  BRE PECINA is a 24 y.o. [id number] at [redacted]w[redacted]d here for IOL secondary to cholestasis of pregnancy.  #Labor: IOL secondary to cholestasis of pregnancy as noted below. Given h/o shoulder dystocia with first delivery, pt had MFM ultrasound on admission which showed EFW 3160g at 64%. Given infant's weight less than first child with shoulder dystocia, pt confirmed desire to [facility] on arrival to L&D. Will plan for FB placement followed by pitocin.  Reviewed risks/benefits of TOLAC versus RCS in detail. Patient counseled regarding potential vaginal delivery, chance of success, future implications, possible uterine rupture and need for urgent/emergent repeat cesarean. Counseled regarding potential need for repeat c-section for reasons unrelated to first c-section. Counseled regarding scheduled repeat cesarean including risks of bleeding, infection, damage to surrounding tissue, abnormal placentation, implications for future pregnancies. All questions answered.  Patient desires, consent signed in clinic prior to admission.  #Pain: Plan for epidural per pt  request #FWB: Category 1 strip, reactive on admission #ID: GBS+ >PCN on admission #MOF: breast #MOC: plan for post-placental IUD (Liletta)--consented on admission #Circ:  N/A #Cholestasis of pregnancy: bile acids 23 on 9/14. Continued on actigall 300 TID prior to delivery. #H/o Cesarean: elective in 2020 s/p shoulder dystocia with first delivery. Pt desires TOLAC as noted above. #H/o Shoulder Dystocia: Given h/o shoulder dystocia in first pregnancy with 7lb 4.8oz infant, counseled on risks of shoulder dystocia on admission today. Given MFM ultrasound today showing EFW 3166g (7lb 0oz), pt desires TOLAC as noted above. #H/o PPH: Pt with EBL 1500 s/p Bakari x24hrs secondary to atony s/p first delivery. Will have a low threshold to administer TXA s/p delivery. #Hearing Impairment with Cochlear Implant Infection: continue clindamycin as currently prescribed. #Anxiety/Depression: no current medications. No current safety concerns. Will plan for 1 week mood check postpartum.  Randa Ngo, MD  02/12/2020, 8:56 AM

## 2020-02-12 NOTE — Procedures (Signed)
POST-PLACENTAL IUD INSERTION PROCEDURE NOTE Patient name: Stacey Cook MRN [id number]  Date of birth: Jun 15, 1995  The risks and benefits of the method and placement have been thouroughly reviewed with the patient and all questions were answered.  Specifically the patient is aware of failure rate of 05/998, expulsion of the IUD and of possible perforation.  The patient is aware of irregular bleeding due to the method and understands the incidence of irregular bleeding diminishes with time.   Signed copy of informed consent in chart.   Vaginal, labial and perineal areas thoroughly inspected for lacerations. Lacerations: n/a laceration identified and if needed was repaired prior to IUD insertion  Pt's IUD choice: Liletta  Time out was performed.    IUD removed from insertion device and grasped between sterile gloved fingers. Fundus identified through abdominal wall using non-insertion hand. IUD inserted to fundus with bimanual technique. IUD carefully released at the fundus and insertion hand gently removed from vagina.    Strings trimmed to the level of the introitus. Patient tolerated procedure well.  Patient given post procedure instructions and IUD care card with expiration date.  Patient is asked to keep IUD strings tucked in her vagina until her postpartum follow up visit in 4-6 weeks. Patient advised to abstain from sexual intercourse and pulling on strings before her follow-up visit. Patient verbalized an understanding of the plan of care and agrees.   Pt added to the post-placental IUD insertion list and charges entered.  [place], [facility] 02/12/2020 9:38 PM

## 2020-02-12 NOTE — Progress Notes (Signed)
L&D Note  02/12/2020 - 6:08 PM  24 y.o. Z6X0960 [redacted]w[redacted]d. Pregnancy complicated by:  Patient Active Problem List   Diagnosis Date Noted  . Supervision of high risk pregnancy, antepartum 08/23/2019  . Cellulitis of scalp 04/02/2019  . Depression, postpartum 02/09/2019  . Epidermal cyst 02/09/2019  . Status post cesarean section 01/03/2019  . 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 Gastroesophageal reflux disease   . Pancreatitis 01/12/2018  . History of shoulder dystocia in prior pregnancy 12/02/2017  . History of postpartum hemorrhage 12/02/2017  . Cholestasis during pregnancy in third trimester 10/27/2017  . History of cholestasis during pregnancy 09/21/2017  . History of gestational diabetes 08/24/2017  . Chest pain due to GERD 08/05/2017  . Depression with anxiety and suicidal ideations 06/13/2017  . History of miscarriage 01/18/2017  . Conductive hearing loss, unilat, unrestrict hearing contralateral side 12/10/2015  . 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 Asthma, mild intermittent 02/18/2015  . 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 Perforation of left tympanic membrane 06/19/2014  . ADHD (attention deficit hyperactivity disorder), combined type 08/01/2012  . ODD (oppositional defiant disorder) 08/01/2012  . Vasovagal syncope 02/28/2012  . Cholesteatoma of attic of right ear 02/15/2011    Stacey Cook is admitted for IOL for cholestasis of pregnancy   Subjective:  Comfortable. She feels UCs but not painful. Epidural in place Objective:   FHR: 135 baseline, + accels, ? Subtle early decels, mod variability Toco: q2-54m Gen: NAD SVE: deferred.   Labs:  Recent Labs  Lab 02/12/20 0807  WBC 9.5  HGB 10.6*  HCT 33.4*  PLT 181   A POS  Medications Current  Facility-Administered Medications  Medication Dose Route Frequency Provider Last Rate Last Admin  . acetaminophen (TYLENOL) tablet 650 mg  650 mg Oral Q4H PRN Jonnie Kind, MD   650 mg at 02/12/20 1104  . diphenhydrAMINE (BENADRYL) injection 12.5 mg  12.5 mg Intravenous Q15 min PRN Janeece Riggers, MD   12.5 mg at 02/12/20 1714  . ePHEDrine injection 10 mg  10 mg Intravenous PRN Janeece Riggers, MD      . ePHEDrine injection 10 mg  10 mg Intravenous PRN Janeece Riggers, MD      . fentaNYL (SUBLIMAZE) injection 100 mcg  100 mcg Intravenous Q1H PRN Randa Ngo, MD   100 mcg at 02/12/20 1512  . fentaNYL 2 mcg/mL w/ bupivacaine 0.125% in NS 250 mL epidural infusion (WCC-ANES)  12 mL/hr Epidural Continuous PRN Janeece Riggers, MD      . lactated ringers infusion 500-1,000 mL  500-1,000 mL Intravenous PRN Jonnie Kind, MD      . lactated ringers infusion   Intravenous Continuous Jonnie Kind, MD 125 mL/hr at 02/12/20 0743 New Bag at 02/12/20 0743  . levonorgestrel (LILETTA) 19.5 MCG/DAY IUD   Intrauterine Once Randa Ngo, MD      . lidocaine (PF) (XYLOCAINE) 1 % injection 30 mL  30 mL Subcutaneous PRN Jonnie Kind, MD      . ondansetron [facility]) injection 4 mg  4 mg Intravenous Q6H PRN Jonnie Kind, MD   4 mg at 02/12/20 1104  . oxyCODONE-acetaminophen (PERCOCET/ROXICET) 5-325 MG per tablet 1 tablet  1 tablet Oral Q4H PRN Jonnie Kind, MD      .  oxyCODONE-acetaminophen (PERCOCET/ROXICET) 5-325 MG per tablet 2 tablet  2 tablet Oral Q4H PRN Jonnie Kind, MD      . oxytocin (PITOCIN) IV BOLUS FROM BAG  333 mL Intravenous Once Jonnie Kind, MD      . oxytocin (PITOCIN) IV infusion 30 units in NS 500 mL - Premix  2.5 Units/hr Intravenous Continuous Jonnie Kind, MD      . oxytocin (PITOCIN) IV infusion 30 units in NS 500 mL - Premix  1-40 milli-units/min Intravenous Titrated Randa Ngo, MD 6 mL/hr at 02/12/20 1700 6 milli-units/min at 02/12/20 1700  . penicillin G  potassium 3 Million Units in dextrose 29mL IVPB  3 Million Units Intravenous Q4H Randa Ngo, MD 100 mL/hr at 02/12/20 1802 3 Million Units at 02/12/20 1802  . PHENYLephrine 40 mcg/ml in normal saline Adult IV Push Syringe (For Blood Pressure Support)  80 mcg Intravenous PRN Janeece Riggers, MD      . PHENYLephrine 40 mcg/ml in normal saline Adult IV Push Syringe (For Blood Pressure Support)  80 mcg Intravenous PRN Janeece Riggers, MD      . sodium citrate-citric acid (ORACIT) solution 30 mL  30 mL Oral Q2H PRN Jonnie Kind, MD      . sodium phosphate (FLEET) 7-19 GM/118ML enema 1 enema  1 enema Rectal PRN Jonnie Kind, MD      . terbutaline (BRETHINE) injection 0.25 mg  0.25 mg Subcutaneous Once PRN Randa Ngo, MD       Facility-Administered Medications Ordered in Other Encounters  Medication Dose Route Frequency Provider Last Rate Last Admin  . fentaNYL citrate (PF) (SUBLIMAZE) 500 mcg, bupivacaine HCl (PF) (SENSORCAINE-MPF) 41.67 mL in sodium chloride (PF) 0.9 % 250 mL epidural   Epidural Continuous PRN Janeece Riggers, MD 12 mL/hr at 02/12/20 1527 12 mL/hr at 02/12/20 1527  . lidocaine (PF) (XYLOCAINE) 1 % injection   Epidural Anesthesia Intra-op Janeece Riggers, MD   8 mL at 02/12/20 1527    Assessment & Plan:  Pt doing well *IUP: category I tracing with accels. Fetal status reassuring *IOL: OB history reviewed and d/w her. I told her high risk of rpt shoulder dystocia given similar weights. Pt would like to tolac. Continue to follow closely and extra help in the room at time of delivery..continue pitocin per protocol *GBS: positive. Continue PCN *Analgesia: epidural working well  Aletha Halim, Brooke Bonito. MD Attending Center for [facility])

## 2020-02-13 LAB — CBC
HCT: 32.2 % — ABNORMAL LOW (ref 36.0–46.0)
Hemoglobin: 10.5 g/dL — ABNORMAL LOW (ref 12.0–15.0)
MCH: 28.5 pg (ref 26.0–34.0)
MCHC: 32.6 g/dL (ref 30.0–36.0)
MCV: 87.3 fL (ref 80.0–100.0)
Platelets: 189 10*3/uL (ref 150–400)
RBC: 3.69 MIL/uL — ABNORMAL LOW (ref 3.87–5.11)
RDW: 14.7 % (ref 11.5–15.5)
WBC: 13.1 10*3/uL — ABNORMAL HIGH (ref 4.0–10.5)
nRBC: 0 % (ref 0.0–0.2)

## 2020-02-13 MED ORDER — CYCLOBENZAPRINE HCL 10 MG PO TABS
10.0000 mg | ORAL_TABLET | Freq: Three times a day (TID) | ORAL | Status: DC | PRN
  Administered 2020-02-13 – 2020-02-14 (×2): 10 mg via ORAL
  Filled 2020-02-13 (×2): qty 1

## 2020-02-13 NOTE — Lactation Note (Signed)
This note was copied from a baby's chart. Lactation Consultation Note LC attempted to see mom. everyone in room sleeping. Lights dim. Spoke w/RN. RN stated mom is breast/formula.  Patient Name: Stacey Cook [id number] Date: 02/13/2020     Maternal Data    Feeding    LATCH Score                   Interventions    Lactation Tools Discussed/Used     Consult Status      Raelin Pixler G 02/13/2020, 4:00 AM

## 2020-02-13 NOTE — Anesthesia Preprocedure Evaluation (Signed)
Anesthesia Evaluation  Patient identified by MRN, date of birth, ID band Patient awake    Reviewed: Allergy & Precautions, NPO status , Patient's Chart, lab work & pertinent test results  History of Anesthesia Complications (+) PONV  Airway Mallampati: II  TM Distance: >3 FB Neck ROM: Full    Dental no notable dental hx.    Pulmonary asthma , former smoker,    Pulmonary exam normal breath sounds clear to auscultation       Cardiovascular negative cardio ROS Normal cardiovascular exam Rhythm:Regular Rate:Normal     Neuro/Psych  Headaches, PSYCHIATRIC DISORDERS Anxiety Depression    GI/Hepatic Neg liver ROS, GERD  ,  Endo/Other  negative endocrine ROSdiabetes, Gestational  Renal/GU negative Renal ROS  negative genitourinary   Musculoskeletal  (+) Arthritis ,   Abdominal   Peds  Hematology negative hematology ROS (+)   Anesthesia Other Findings IOL for cholestasis  Reproductive/Obstetrics (+) Pregnancy                             Anesthesia Physical Anesthesia Plan  ASA: III  Anesthesia Plan: Epidural   Post-op Pain Management:    Induction:   PONV Risk Score and Plan: Treatment may vary due to age or medical condition  Airway Management Planned: Natural Airway  Additional Equipment:   Intra-op Plan:   Post-operative Plan:   Informed Consent: I have reviewed the patients History and Physical, chart, labs and discussed the procedure including the risks, benefits and alternatives for the proposed anesthesia with the patient or authorized representative who has indicated his/her understanding and acceptance.       Plan Discussed with: Anesthesiologist  Anesthesia Plan Comments: (Patient identified. Risks, benefits, options discussed with patient including but not limited to bleeding, infection, nerve damage, paralysis, failed block, incomplete pain control, headache, blood  pressure changes, nausea, vomiting, reactions to medication, itching, and post partum back pain. Confirmed with bedside nurse the patient's most recent platelet count. Confirmed with the patient that they are not taking any anticoagulation, have any bleeding history or any family history of bleeding disorders. Patient expressed understanding and wishes to proceed. All questions were answered. )        Anesthesia Quick Evaluation

## 2020-02-13 NOTE — Progress Notes (Addendum)
CSW was advised by MOB that she also gets [facility]. MOB requested application for Food Stamps at this time in which CSW has provided MOB with as well as therapy resources. CSW also made [place] ref for MOB for other supports.      Virgie Dad Tylan Briguglio, MSW, LCSW [facility] [place] [facility] [place] [phone]

## 2020-02-13 NOTE — Anesthesia Postprocedure Evaluation (Signed)
Anesthesia Post Note  Patient: Stacey Cook  Procedure(s) Performed: AN AD HOC LABOR EPIDURAL     Patient location during evaluation: Mother Baby Anesthesia Type: Epidural Level of consciousness: awake and alert and oriented Pain management: satisfactory to patient Vital Signs Assessment: post-procedure vital signs reviewed and stable Respiratory status: spontaneous breathing and nonlabored ventilation Cardiovascular status: stable Postop Assessment: no headache, no backache, no signs of nausea or vomiting, adequate PO intake, patient able to bend at knees and able to ambulate (patient up walking) Anesthetic complications: no   No complications documented.  Last Vitals:  Vitals:   02/13/20 0047 02/13/20 0510  BP: (!) 120/56 (!) 107/56  Pulse: 74 67  Resp: 16 16  Temp:    SpO2: 98% 99%    Last Pain:  Vitals:   02/13/20 0721  TempSrc:   PainSc: 8    Pain Goal: Patients Stated Pain Goal: 3 (02/13/20 0721)                 Willa Rough

## 2020-02-13 NOTE — Progress Notes (Signed)
Received call from nurse about back pain. Patient requesting home dose flexeril. Will place order.

## 2020-02-13 NOTE — Lactation Note (Signed)
This note was copied from a baby's chart. Lactation Consultation Note Mom told RN that she only wanted to formula feed.  Patient Name: Stacey Cook NOPWK'H Date: 02/13/2020     Maternal Data    Feeding Feeding Type: Formula  LATCH Score                   Interventions    Lactation Tools Discussed/Used     Consult Status      Linsie Lupo G 02/13/2020, 6:18 AM

## 2020-02-13 NOTE — Lactation Note (Addendum)
This note was copied from a baby's chart. Lactation Consultation Note  Patient Name: Stacey Cook NUUVO'Z Date: 02/13/2020 Reason for consult: Follow-up assessment P3, 19 hour ETI female infant, -1% weight loss . Mom's feeding choice is breast and formula feeding. Infant had 3 voids and 2 stools. Mom's current feeding choice is breast and formula feeding. LC discussed hand expression and mom easily expressed colostrum, LC explained how to use hand pump and mom easily pumped 12 mls of colostrum she will offer to infant after she latches infant at the breast for next feeding. Per mom, she felt a little pain with last latch, infant BF for 20 minutes at 86 but LC did not observe any trauma on nipple shaft did observe mom is short shafted. Mom wants LC observe latch infant was cuing, LC ask mom pre-pump with hand pump to help extend nipple shaft out more, mom latched infant on her right breast using the football hold position, infant latched with depth, swallows were heard, per mom, she can feel a difference with this latch, no pain, infant only BF for 5 minutes due previously BF appeared content.  Mom knows to BF according to cues, 8 too 12+ times within 24 hours, STS. Mom knows to call RN or LC if she has any questions, concerns or needs assistance with latching infant at the breast. Mom has the BF supplemental sheet she plans to BF infant first and then give back EBM that she pumped or formula this is mom's choice. Mom made aware of O/P services, breastfeeding support groups, community resources, and our phone # for post-discharge questions.   Maternal Data Formula Feeding for Exclusion: Yes Reason for exclusion: Mother's choice to formula and breast feed on admission Does the patient have breastfeeding experience prior to this delivery?: Yes  Feeding Feeding Type: Breast Fed  LATCH Score Latch: Grasps breast easily, tongue down, lips flanged, rhythmical sucking.  Audible Swallowing:  Spontaneous and intermittent  Type of Nipple: Everted at rest and after stimulation (semi short shaft)  Comfort (Breast/Nipple): Soft / non-tender  Hold (Positioning): Assistance needed to correctly position infant at breast and maintain latch.  LATCH Score: 9  Interventions Interventions: Breast feeding basics reviewed;Assisted with latch;Breast compression;Skin to skin;Adjust position;Breast massage;Support pillows;Hand express;Position options;Expressed milk;Pre-pump if needed;Hand pump  Lactation Tools Discussed/Used WIC Program: Yes Pump Review: Milk Storage;Setup, frequency, and cleaning Initiated by:: Vicente Serene, IBCLC Date initiated:: 02/13/20   Consult Status Consult Status: Follow-up Date: 02/14/20 Follow-up type: In-patient    Vicente Serene 02/13/2020, 4:56 PM

## 2020-02-13 NOTE — Progress Notes (Signed)
Post Partum Day 1 Subjective: Eating, drinking, voiding, ambulating well.  +flatus.  Lochia and pain wnl.  Denies dizziness, lightheadedness, or sob. No complaints. [place] she wants to go home @ 24hrs d/t no one to stay w/ her tonight and sick child at home who wants her.   Objective: Blood pressure (!) 118/59, pulse 72, temperature 98 F (36.7 C), temperature source Oral, resp. rate 17, height 4\' 11"  (1.499 m), weight 87.4 kg, last menstrual period 05/29/2019, SpO2 97 %, unknown if currently breastfeeding.  Physical Exam:  General: alert, cooperative and no distress Lochia: appropriate Uterine Fundus: firm Incision: n/a DVT Evaluation: No evidence of DVT seen on physical exam. Negative Homan's sign. No cords or calf tenderness. No significant calf/ankle edema.  Recent Labs    02/12/20 0807 02/13/20 0523  HGB 10.6* 10.5*  HCT 33.4* 32.2*    Assessment/Plan: Possible d/c at 24hrs, pt to let nurse know if she truly wants to go home after PKU. Oncoming shift notified. Breast and bottlefeeding. Infected cochlear implant, on augmentin, improving.    LOS: 1 day   Roma Schanz 02/13/2020, 9:01 AM

## 2020-02-13 NOTE — Clinical Social Work Maternal (Signed)
CLINICAL SOCIAL WORK MATERNAL/CHILD NOTE  Patient Details  Name: Stacey Cook MRN: [id number] Date of Birth: 1995/08/28  Date:  02/13/2020  Clinical Social Worker Initiating Note:  Durward Fortes, LCSW Date/Time: Initiated:  02/13/20/0900     Child's Name:  Stacey Cook   Biological Parents:  Mother, Father Stacey Cook, [place])   Need for Interpreter:  None   Reason for Referral:  Behavioral Health Concerns   Address:  [place] [address]    Phone number:  [phone] (home)     Additional phone number: none   Household Members/Support Persons (HM/SP):   Household Member/Support Person 2, Household Member/Support Person 1, Household Member/Support Person 7   HM/SP Name Relationship DOB or Age  HM/SP -1  Stacey Cook MOB 11/01/1995  HM/SP -[place] FOB 10/01/1988  HM/SP -3 Stacey Cook son 50 years old  HM/SP -[place] daughter 22 years old  HM/SP -[place] MGM unknown  HM/SP -[place] MOB's grandmother unknown  HM/SP -[place] MOB's grandpop unknown  HM/SP -8 [place] MOB's cosuin 22 years old     Natural Supports (not living in the home):  Extended Family (MOB also reported that her sister on law Stacey Cook lives in the home with them).   Professional Supports: Organized support group (Comment) ([place])   Employment: Unemployed   Type of Work: none   Education:  High school graduate   Homebound arranged:  n/a  Museum/gallery curator Resources:  Medicaid   Other Resources:  [employer], Food Stamps    Cultural/Religious Considerations Which May Impact Care:  none reported.   Strengths:    ability to meet basic needs. Ped chosen, aware of infants care.   Psychotropic Medications:      MOB reported that she was on Effexor in the pasy but stopped medication due to not knowing if it was safe to breast feed on.    Pediatrician:     [facility] in [place]   Pediatrician List:   [facility]      Pediatrician Fax Number:    Risk Factors/Current Problems:  Mental Health Concerns  (MOB expressed that she has mental health hx of Bipolar, anxiety, depression, adn BPD.)   Cognitive State:  Insightful , Able to Concentrate , Alert    Mood/Affect:  Relaxed , Comfortable , Calm , Interested , Happy    CSW Assessment: CSW consulted as MOB has a hx of anxiety, depression and Suicidal thoughts. CSW went to speak with MOB at bedside to address further needs.  CSW congratulated MOB on the birth of infant. CSW advised MOB of the HIPPA policy as CSW noted that MOB has guest in the room. MOB expressed that it was her mom and that "I live with her so its okay". CSW understanding and advised MOB of CSW's role and the reason for CSW coming to speak with her. MOB expressed that she was diagnosed with anxiety and depression while in high school. MOB expressed that she was on Effexor prior to pregnancy however MOB expressed that she stopped taking the medication due to the desire to breast feed infant. MOB expressed that she feels that her depression and anxiety is well managed when on this medication however MOB still reports that she deal with anxiety and depression while  on meds. CSW inquired from [facility] on therapy. MOB expressed that she was in therapy in the past "but I aged out and that stopped". MOB reported that she hasn't been in therapy in over two years and requested resources for therapy. MOB requested that therapy resources be within [place], [place], or [place] as "anything further is  just a challenge for [name] to get to". CSW understanding of this and expressed to MOB that CSW would try and locate resources in the areas. CSW asked MOB about other mental health hx in which MOB Expressed that when she was in therapy "I was diagnosed with Borderline Personality Disorder and Bipolar". MOB expressed that  these diagnosis were given to her while in high school. MOB verbalized not being sure if she has these diagnosis for sure but does report that she "can switch from being okay one minute and then I start to cry the next. Its like how I take the things that people say to me". MOB reported no medication use for these diagnosis but does report that she and her OB provider have been in contact and will discuss further medication needs at her 6 week follow up appointment. MOB reported that while pregnant she anxiety and depression has been "okay". MOB advised CSW that she is active in a group at the [facility] in [place] that MOB expressed helps her with anxiety and depression. 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 MOB reported that depression became really challenging for her and "I was almost hospitalized due to it". MOB expressed that when having PPD and regular depression she didn't want to care for the baby, and would sometimes have thoughts of self harm. MOB expressed to CSW that she has not had feelings/thoughts of self harm since the. MOB reported no SI or HI to this CSW and expressed no DV.  CSW inquired from [facility] on who her supports are. MOB expressed that she lives at home with her family. MOB reported that she has her spouse Stacey Cook, her mom, and her grandparents as well. MOB reported that she feels that she has all needed items to care for infant with no other needs noted. MOB reported that she has a pack n play for infant to sleep in once arrived home. CSW took time to provide MOB with PPD and SIDS education. MOB was given PPD Checklist to keep track of her feelings as they relate to PPD. MOB expressed that infant would be [facility] at [facility] for further f/u care. MOB expressed no other needs and thanked CSW. No barrier's to d/c.   CSW Plan/Description:  No Further Intervention Required/No Barriers to Discharge, Perinatal Mood and Anxiety  Disorder (PMADs) Education, Neonatal Abstinence Syndrome (NAS) Education    Wetzel Bjornstad, [place] 02/13/2020, 9:52 AM

## 2020-02-14 ENCOUNTER — Encounter (HOSPITAL_COMMUNITY): Payer: Self-pay | Admitting: Obstetrics and Gynecology

## 2020-02-14 MED ORDER — IBUPROFEN 600 MG PO TABS: 600.0000 mg | ORAL_TABLET | Freq: Four times a day (QID) | ORAL | 0 refills | Status: DC | PRN

## 2020-02-14 MED ORDER — AMOXICILLIN-POT CLAVULANATE 875-125 MG PO TABS: 1.0000 | ORAL_TABLET | Freq: Two times a day (BID) | ORAL | 0 refills | Status: AC

## 2020-02-14 MED ORDER — ACETAMINOPHEN 325 MG PO TABS: 650.0000 mg | ORAL_TABLET | Freq: Four times a day (QID) | ORAL | Status: DC | PRN

## 2020-02-14 MED ORDER — COCONUT OIL OIL: 1.0000 "application " | TOPICAL_OIL | 0 refills | Status: DC | PRN

## 2020-02-14 NOTE — Lactation Note (Signed)
This note was copied from a baby's chart. Lactation Consultation Note  Patient Name: Stacey Cook WIOMB'T Date: 02/14/2020 Reason for consult: Follow-up assessment  LC Follow Up Visit:  During my follow up visit I noted that mother has been primarily bottle feeding.  When asked mother's feeding preference now she stated, "I tried my best but my nipples got too sore."  She has changed from breast feeding to formula feeding. Discussed no breast stimulation and using cabbage leaves for milk suppression if needed.  Mother verbalized understanding.  Support person present.   Maternal Data    Feeding    LATCH Score                   Interventions    Lactation Tools Discussed/Used     Consult Status Consult Status: Complete Date: 02/14/20 Follow-up type: Call as needed    [name] [name] 02/14/2020, 10:50 AM

## 2020-02-21 ENCOUNTER — Telehealth: Payer: Self-pay

## 2020-02-21 NOTE — Telephone Encounter (Signed)
Pt's IUD fell out last night. Pt was advised she won't have another IUD placed before her postpartum visit. Advised NO SEX!!!!! Pt states she is having vaginal pain. I advised that's probably from where IUD fell out. If don't get better in a couple days, let us know. Pt voiced understanding. [place]

## 2020-02-21 NOTE — Telephone Encounter (Signed)
Pt had IUD placed at the hospital and she stated that her IUD fell out last night (02/20/20)will she need another appointment to have it replaced before her post partum visit on  03/19/20.

## 2020-03-19 ENCOUNTER — Other Ambulatory Visit: Payer: Self-pay

## 2020-03-19 ENCOUNTER — Ambulatory Visit (INDEPENDENT_AMBULATORY_CARE_PROVIDER_SITE_OTHER): Payer: Medicaid Other | Admitting: Women's Health

## 2020-03-19 ENCOUNTER — Encounter: Payer: Self-pay | Admitting: Women's Health

## 2020-03-19 DIAGNOSIS — Z3202 Encounter for pregnancy test, result negative: Secondary | ICD-10-CM

## 2020-03-19 DIAGNOSIS — F53 Postpartum depression: Secondary | ICD-10-CM

## 2020-03-19 DIAGNOSIS — Z8759 Personal history of other complications of pregnancy, childbirth and the puerperium: Secondary | ICD-10-CM

## 2020-03-19 DIAGNOSIS — R350 Frequency of micturition: Secondary | ICD-10-CM

## 2020-03-19 DIAGNOSIS — O99345 Other mental disorders complicating the puerperium: Secondary | ICD-10-CM

## 2020-03-19 DIAGNOSIS — Z30015 Encounter for initial prescription of vaginal ring hormonal contraceptive: Secondary | ICD-10-CM

## 2020-03-19 LAB — POCT URINE PREGNANCY: Preg Test, Ur: NEGATIVE

## 2020-03-19 MED ORDER — ETONOGESTREL-ETHINYL ESTRADIOL 0.12-0.015 MG/24HR VA RING: VAGINAL_RING | VAGINAL | 12 refills | Status: DC

## 2020-03-19 MED ORDER — CITALOPRAM HYDROBROMIDE 20 MG PO TABS: ORAL_TABLET | ORAL | 2 refills | Status: DC

## 2020-03-19 NOTE — Progress Notes (Signed)
POSTPARTUM VISIT Patient name: Stacey Cook MRN [id number]  Date of birth: 11/28/1995 Chief Complaint:   Postpartum Care (interested in tubal; wants NuvaRing until can get tubal)  History of Present Illness:   Stacey Cook is a 24 y.o. [phone] Caucasian female being seen today for a postpartum visit. She is 5 weeks postpartum following a vaginal birth after cesarean (VBAC) at 37.0 gestational weeks. IOL: Yes, for Cholestasis of pregnancy. Anesthesia: epidural.  Laceration: periurethral.  Complications: none. Inpatient contraception: yes pp Liletta inserted, expulsed on 10/14.   Pregnancy complicated by ICP. Tobacco use: yes smoker. Substance use disorder: no. Last pap smear: 08/23/19 and results were normal. Next pap smear due: 2024 Patient's last menstrual period was 03/19/2020.  Postpartum course has been complicated by IUD expulsion. Some urinary frequency. Bleeding on period- started today. Bowel function is normal. Bladder function is normal. Urinary incontinence? No, fecal incontinence? No Patient is sexually active.  Desired contraception: using condoms now, wants BTLwants nuvaring until BTL. Patient does not want a pregnancy in the future.  Desired family size is 3 children.   The pregnancy intention screening data noted above was reviewed. Potential methods of contraception were discussed. The patient elected to proceed with Vaginal Ring.   Edinburgh Postpartum Depression Screening: positive. H/O dep/anx, has been on lexapro and effexor- didn't help so stopped. On zoloft in past, didn't like the way it made her feel. Denies SI/HI/II. Wants to get on meds and have IBH visit  Edinburgh Postnatal Depression Scale - 03/19/20 1339      Edinburgh Postnatal Depression Scale:  In the Past 7 Days   I have been able to laugh and see the funny side of things. 1    I have looked forward with enjoyment to things. 2    I have blamed myself unnecessarily when things went wrong. 3    I have been  anxious or worried for no good reason. 3    I have felt scared or panicky for no good reason. 2    Things have been getting on top of me. 2    I have been so unhappy that I have had difficulty sleeping. 3    I have felt sad or miserable. 3    I have been so unhappy that I have been crying. 3    The thought of harming myself has occurred to me. 0    Edinburgh Postnatal Depression Scale Total 22          Baby's course has been uncomplicated. Baby is feeding by bottle. Infant has a pediatrician/family doctor? Yes.    Pt has material needs met for her and baby: Yes.   Review of Systems:   Pertinent items are noted in HPI Denies Abnormal vaginal discharge w/ itching/odor/irritation, headaches, visual changes, shortness of breath, chest pain, abdominal pain, severe nausea/vomiting, or problems with urination or bowel movements. Pertinent History Reviewed:  Reviewed past medical,surgical, obstetrical and family history.  Reviewed problem list, medications and allergies. OB History  Gravida Para Term Preterm AB Living  _0 0 2 3  SAB TAB Ectopic Multiple Live Births  2 0 0 0 3    # Outcome Date GA Lbr Len/2nd Weight Sex Delivery Anes PTL Lv  5 Term 02/12/20 37w0d04:55 / 00:16 6 lb 2.8 oz (2.8 kg) F VBAC EPI  LIV  4 SAB 05/04/19          3 Term 01/03/19 [id number] lb 3 oz (  3.714 kg) F CS-LTranv   LIV  2 Term 10/28/17 [redacted]w[redacted]d 10:29 / 01:39 7 lb 4.8 oz (3.311 kg) M Vag-Spont EPI  LIV     Complications: Shoulder Dystocia  1 SAB 09/11/16 [redacted]w[redacted]d          Physical Assessment:   Vitals:   03/19/20 1345  BP: 127/86  Pulse: 99  Weight: 188 lb (85.3 kg)  Height: 4' 11.5" (1.511 m)  Body mass index is 37.34 kg/m.       Physical Examination:   General appearance: alert, well appearing, and in no distress  Mental status: alert, oriented to person, place, and time  Skin: warm & dry   Cardiovascular: normal heart rate noted   Respiratory: normal respiratory effort, no distress   Breasts:  deferred, no complaints   Abdomen: soft, non-tender   Pelvic: examination not indicated  Rectal: not examined   Extremities: no edema  Chaperone: N/A         Results for orders placed or performed in visit on 03/19/20 (from the past 24 hour(s))  POCT urine pregnancy   Collection Time: 03/19/20  1:49 PM  Result Value Ref Range   Preg Test, Ur Negative Negative    Assessment & Plan:  1) Postpartum exam 2) 5 wks s/p vaginal birth after cesarean (VBAC)  After IOL for ICP 3) bottle feeding 4) Depression screening 5) Contraception management> rx Nuvaring, condoms x 2wks, advised smoking cessation, discussed r/f dvt/pe/mi/cva/htn. Wants BTL, discussed high incidence regret <30yo- still wants 6) PPD> rx celexa, referral to IBH, f/u w/ [name] in 4wks for med f/u  Essential components of care per ACOG recommendations:  1.  Mood and well being:  . If positive depression screen, discussed and plan developed.  . If using tobacco we discussed reduction/cessation and risk of relapse . If current substance abuse, we discussed and referral to local resources was offered.   2. Infant care and feeding:  . If breastfeeding, discussed returning to work, pumping, breastfeeding-associated pain, guidance regarding return to fertility while lactating if not using another method. If needed, patient was provided with a letter to be allowed to pump q 2-3hrs to support lactation in a private location with access to a refrigerator to store breastmilk.   . Recommended that all caregivers be immunized for flu, pertussis and other preventable communicable diseases . If pt does not have material needs met for her/baby, referred to local resources for help obtaining these.  3. Sexuality, contraception and birth spacing . Provided guidance regarding sexuality, management of dyspareunia, and resumption of intercourse . Discussed avoiding interpregnancy interval <58mths and recommended birth spacing of 18 months  4.  Sleep and fatigue . Discussed coping options for fatigue and sleep disruption . Encouraged family/partner/community support of 4 hrs of uninterrupted sleep to help with mood and fatigue  5. Physical recovery  . If pt had a C/S, assessed incisional pain and providing guidance on normal vs prolonged recovery . If pt had a laceration, perineal healing and pain reviewed.  . If urinary or fecal incontinence, discussed management and referred to PT or uro/gyn if indicated  . Patient is safe to resume physical activity. Discussed attainment of healthy weight.  6.  Chronic disease management . Discussed pregnancy complications if any, and their implications for future childbearing and long-term maternal health. . Review recommendations for prevention of recurrent pregnancy complications, such as 17 hydroxyprogesterone caproate to reduce risk for recurrent PTB not applicable, or aspirin to reduce risk of preeclampsia  not applicable. . Pt had GDM: No. If yes, 2hr GTT scheduled: not applicable. . Reviewed medications and non-pregnant dosing including consideration of whether pt is breastfeeding using a reliable resource such as LactMed: not applicable . Referred for f/u w/ PCP or subspecialist providers as indicated: yes  7. Health maintenance . Mammogram at 22yo or earlier if indicated . Pap smears as indicated  Meds:  Meds ordered this encounter  Medications  . etonogestrel-ethinyl estradiol (NUVARING) 0.12-0.015 MG/24HR vaginal ring    Sig: Insert vaginally and leave in place for 3 consecutive weeks, then remove for 1 week.    Dispense:  1 each    Refill:  12    Order Specific Question:   Supervising Provider    Answer:   Elonda Husky, LUTHER H [[id number]]  . citalopram (CELEXA) 20 MG tablet    Sig: Take 45m (1/2 tablet) daily x 1 week, then 249m(1 tablet) daily thereafter    Dispense:  30 tablet    Refill:  2    Order Specific Question:   Supervising Provider    Answer:   EUFlorian [id number]]     Follow-up: Return in about 4 weeks (around 04/16/2020) for F/U, in person, [facility] only- med f/u, discuss BTL.   Orders Placed This Encounter  Procedures  . Urine Culture  . Amb ref to [facility]. POCT urine pregnancy    KiBedford HeightsWHPathway Rehabilitation [facility]1/02/2020 2:26 PM

## 2020-03-22 LAB — URINE CULTURE

## 2020-04-07 ENCOUNTER — Emergency Department (HOSPITAL_COMMUNITY)
Admission: EM | Admit: 2020-04-07 | Discharge: 2020-04-07 | Disposition: A | Discharge status: Home / Self Care | Payer: Medicaid Other | Attending: Emergency Medicine | Admitting: Emergency Medicine

## 2020-04-07 ENCOUNTER — Other Ambulatory Visit: Payer: Self-pay

## 2020-04-07 ENCOUNTER — Emergency Department (HOSPITAL_COMMUNITY): Payer: Medicaid Other

## 2020-04-07 ENCOUNTER — Encounter (HOSPITAL_COMMUNITY): Payer: Self-pay

## 2020-04-07 DIAGNOSIS — R519 Headache, unspecified: Secondary | ICD-10-CM | POA: Diagnosis present

## 2020-04-07 DIAGNOSIS — J4520 Mild intermittent asthma, uncomplicated: Secondary | ICD-10-CM | POA: Insufficient documentation

## 2020-04-07 DIAGNOSIS — L03811 Cellulitis of head [any part, except face]: Secondary | ICD-10-CM | POA: Insufficient documentation

## 2020-04-07 DIAGNOSIS — Z9621 Cochlear implant status: Secondary | ICD-10-CM | POA: Insufficient documentation

## 2020-04-07 DIAGNOSIS — R112 Nausea with vomiting, unspecified: Secondary | ICD-10-CM | POA: Insufficient documentation

## 2020-04-07 DIAGNOSIS — M542 Cervicalgia: Secondary | ICD-10-CM | POA: Diagnosis not present

## 2020-04-07 DIAGNOSIS — H538 Other visual disturbances: Secondary | ICD-10-CM | POA: Insufficient documentation

## 2020-04-07 DIAGNOSIS — L089 Local infection of the skin and subcutaneous tissue, unspecified: Secondary | ICD-10-CM

## 2020-04-07 DIAGNOSIS — F17210 Nicotine dependence, cigarettes, uncomplicated: Secondary | ICD-10-CM | POA: Diagnosis not present

## 2020-04-07 LAB — COMPREHENSIVE METABOLIC PANEL
ALT: 24 U/L (ref 0–44)
AST: 21 U/L (ref 15–41)
Albumin: 3.9 g/dL (ref 3.5–5.0)
Alkaline Phosphatase: 100 U/L (ref 38–126)
Anion gap: 8 (ref 5–15)
BUN: 13 mg/dL (ref 6–20)
CO2: 21 mmol/L — ABNORMAL LOW (ref 22–32)
Calcium: 9 mg/dL (ref 8.9–10.3)
Chloride: 106 mmol/L (ref 98–111)
Creatinine, Ser: 0.7 mg/dL (ref 0.44–1.00)
GFR, Estimated: >60 mL/min (ref >60)
Glucose, Bld: 85 mg/dL (ref 70–99)
Potassium: 4.2 mmol/L (ref 3.5–5.1)
Sodium: 135 mmol/L (ref 135–145)
Total Bilirubin: 0.4 mg/dL (ref 0.3–1.2)
Total Protein: 7.9 g/dL (ref 6.5–8.1)

## 2020-04-07 LAB — CBC WITH DIFFERENTIAL/PLATELET
Abs Immature Granulocytes: 0.03 10*3/uL (ref 0.00–0.07)
Basophils Absolute: 0.1 10*3/uL (ref 0.0–0.1)
Basophils Relative: 1 %
Eosinophils Absolute: 0.2 10*3/uL (ref 0.0–0.5)
Eosinophils Relative: 2 %
HCT: 42.6 % (ref 36.0–46.0)
Hemoglobin: 13.5 g/dL (ref 12.0–15.0)
Immature Granulocytes: 0 %
Lymphocytes Relative: 23 %
Lymphs Abs: 2.2 10*3/uL (ref 0.7–4.0)
MCH: 27.5 pg (ref 26.0–34.0)
MCHC: 31.7 g/dL (ref 30.0–36.0)
MCV: 86.8 fL (ref 80.0–100.0)
Monocytes Absolute: 0.6 10*3/uL (ref 0.1–1.0)
Monocytes Relative: 6 %
Neutro Abs: 6.6 10*3/uL (ref 1.7–7.7)
Neutrophils Relative %: 68 %
Platelets: 260 10*3/uL (ref 150–400)
RBC: 4.91 MIL/uL (ref 3.87–5.11)
RDW: 14.6 % (ref 11.5–15.5)
WBC: 9.7 10*3/uL (ref 4.0–10.5)
nRBC: 0 % (ref 0.0–0.2)

## 2020-04-07 LAB — URINALYSIS, ROUTINE W REFLEX MICROSCOPIC
Bilirubin Urine: NEGATIVE
Glucose, UA: NEGATIVE mg/dL
Ketones, ur: NEGATIVE mg/dL
Nitrite: NEGATIVE
Protein, ur: NEGATIVE mg/dL
Specific Gravity, Urine: 1.021 (ref 1.005–1.030)
pH: 5 (ref 5.0–8.0)

## 2020-04-07 LAB — I-STAT BETA HCG BLOOD, ED (MC, WL, AP ONLY): I-stat hCG, quantitative: <5 m[IU]/mL (ref <5)

## 2020-04-07 LAB — LACTIC ACID, PLASMA: Lactic Acid, Venous: 0.6 mmol/L (ref 0.5–1.9)

## 2020-04-07 MED ORDER — MORPHINE SULFATE (PF) 2 MG/ML IV SOLN
2.0000 mg | Freq: Once | INTRAVENOUS | Status: AC
  Administered 2020-04-07: 2 mg via INTRAVENOUS
  Filled 2020-04-07: qty 1

## 2020-04-07 MED ORDER — MORPHINE SULFATE (PF) 2 MG/ML IV SOLN: 2.0000 mg | Freq: Once | INTRAVENOUS | Status: DC

## 2020-04-07 MED ORDER — HYDROCODONE-ACETAMINOPHEN 5-325 MG PO TABS
1.0000 | ORAL_TABLET | Freq: Once | ORAL | Status: AC
  Administered 2020-04-07: 1 via ORAL
  Filled 2020-04-07: qty 1

## 2020-04-07 MED ORDER — LIDOCAINE HCL (PF) 1 % IJ SOLN
2.0000 mL | Freq: Once | INTRAMUSCULAR | Status: AC
  Administered 2020-04-07: 2 mL
  Filled 2020-04-07: qty 30

## 2020-04-07 MED ORDER — FLUCONAZOLE 200 MG PO TABS: 200.0000 mg | ORAL_TABLET | Freq: Once | ORAL | 0 refills | Status: AC

## 2020-04-07 MED ORDER — IOHEXOL 300 MG/ML  SOLN
75.0000 mL | Freq: Once | INTRAMUSCULAR | Status: AC | PRN
  Administered 2020-04-07: 75 mL via INTRAVENOUS

## 2020-04-07 NOTE — Progress Notes (Signed)
Present with Stacey Cook as she waits. Request for pain medication shared with ED secretary.

## 2020-04-07 NOTE — ED Notes (Signed)
Patient transported to CT 

## 2020-04-07 NOTE — Discharge Instructions (Addendum)
Call Dr. Farrel Gordon tomorrow to schedule a follow-up appointment.  Continue to take your prescribed doxycycline.  Return to the emergency department with any new or worsening symptoms.  I have sent you a single dose of Diflucan to your pharmacy to help with your possible yeast infection.

## 2020-04-07 NOTE — ED Triage Notes (Signed)
Pt to er, pt states that she has a cochlear implant and it is swollen and since yesterday, pt states that it hurts to swallow and her vision is blurry.  States that she is also having a headache.

## 2020-04-07 NOTE — ED Provider Notes (Signed)
[facility] EMERGENCY DEPARTMENT Provider Note   CSN: [id number] Arrival date & time: 04/07/20  1022     History Chief Complaint  Patient presents with  . Headache    Stacey Cook is a 24 y.o. female.  HPI  Patient is a 24 year old female with a medical history as noted below.  She has a history of BAHA implant on the right.  Patient has a history of cellulitis to the site which was treated with clindamycin as well as topical gentamicin in the past.  She was seen on October 3 with similar symptoms.  She was discharged once again with clindamycin.  Patient then had an induced pregnancy on October 5.  She continued to take and complete her clindamycin and feels that her symptoms improved for a short time.  She states she was then started on doxycycline which she is currently taking.  Her symptoms recently began worsening once again and this morning she woke up with pain across the right side of her head and neck.  Pain worsens with head and neck movement as well as palpation.  Patient reports chronic decreased vision in the right eye but states that it typically is not blurry and this morning she woke up with some mild right eye blurry vision.  Reports associated nausea with one episode of clear vomit this AM. No CP, SOB, dysuria, hematuria.      Past Medical History:  Diagnosis Date  . Abdominal pain   . Anemia   . Anginal pain ([place])   . Anxiety   . Arthritis    knees  . Asthma   . Cervicalgia   . Cholestasis during pregnancy   . Chronic abdominal pain   . Complication of anesthesia    light headed  . Constipation   . Ear mass   . Gestational diabetes    pt states not been checking sugars regularly at home; nor has she been taking her Metformin  . Headache   . 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 HSV infection   . Low iron   . Mental disorder   . PONV (postoperative nausea and vomiting)   . Suicidal intent   . UTI (lower urinary tract infection)  05/2014  . Vomiting     Patient Active Problem List   Diagnosis Date Noted  . Cellulitis of scalp 04/02/2019  . Depression, postpartum 02/09/2019  . Epidermal cyst 02/09/2019  . Status post cesarean section 01/03/2019  . 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 Gastroesophageal reflux disease   . Pancreatitis 01/12/2018  . History of shoulder dystocia in prior pregnancy 12/02/2017  . History of postpartum hemorrhage 12/02/2017  . History of cholestasis during pregnancy 09/21/2017  . History of gestational diabetes 08/24/2017  . Chest pain due to GERD 08/05/2017  . Depression with anxiety and suicidal ideations 06/13/2017  . History of miscarriage 01/18/2017  . Conductive hearing loss, unilat, unrestrict hearing contralateral side 12/10/2015  . 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 Asthma, mild intermittent 02/18/2015  . 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 Perforation of left tympanic membrane 06/19/2014  . ADHD (attention deficit hyperactivity disorder), combined type 08/01/2012  . ODD (oppositional defiant disorder) 08/01/2012  . Vasovagal syncope 02/28/2012  . Cholesteatoma of attic  of right ear 02/15/2011    Past Surgical History:  Procedure Laterality Date  . CESAREAN SECTION N/A 01/03/2019   Procedure: CESAREAN SECTION;  Surgeon: [name], MD;  Location: [facility];  Service: Obstetrics;  Laterality: N/A;  . CHOLECYSTECTOMY N/A 01/16/2018   Procedure: LAPAROSCOPIC CHOLECYSTECTOMY;  Surgeon: Aviva Signs, MD;  Location: [facility]S;  Service: General;  Laterality: N/A;  per Dr. Arnoldo Morale, pt will most likely go home and he will tell her to arrive at 10:45 - already has labs  . CHOLESTEATOMA EXCISION    . COCHLEAR IMPLANT Right   . DILATION AND EVACUATION N/A 09/11/2016   Procedure:  DILATATION AND CURRATAGE  2ND TRIMESTER;  Surgeon: Jonnie Kind, MD;  Location: [facility];  Service: Gynecology;  Laterality: N/A;  . IMPLANTATION BONE ANCHORED HEARING AID Right 04/2013  . INNER EAR SURGERY Right 03/31/2018  . 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 TYMPANOPLASTY Left   . TYMPANOSTOMY       OB History    Gravida  5   Para  3   Term  3   Preterm  0   AB  2   Living  3     SAB  2   TAB  0   Ectopic  0   Multiple  0   Live Births  3           Family History  Problem Relation Age of Onset  . Cholelithiasis Mother   . Kidney disease Mother        stones  . Depression Mother   . Hypertension Maternal Grandmother   . Diabetes Maternal Grandmother   . Stroke Maternal Grandmother   . Seizures Maternal Grandmother   . Asthma Maternal Grandmother   . Hyperlipidemia Maternal Grandmother   . Thyroid disease Maternal Grandmother   . Cancer Maternal Grandmother   . Asthma Brother   . Seizures Maternal Uncle   . Cancer Other        breast- great aunt  . Celiac disease Neg Hx     Social History   Tobacco Use  . Smoking status: Current Every Day Smoker    Packs/day: 0.50    Years: 1.00    Pack years: 0.50    Types: Cigarettes    Last attempt to quit: 01/27/2020    Years since quitting: 0.1  . Smokeless tobacco: Never Used  . Tobacco comment: "3-4 cigs a day "  Vaping Use  . Vaping Use: Former  Substance Use Topics  . Alcohol use: Yes    Alcohol/week: 0.0 standard drinks  . Drug use: No    Home Medications Prior to Admission medications   Medication Sig Start Date End Date Taking? Authorizing Provider  acetaminophen (TYLENOL) 325 MG tablet Take 2 tablets (650 mg total) by mouth every 6 (six) hours as needed for moderate pain. 02/14/20   Tilden Dome, MD  albuterol (PROVENTIL) (2.5 MG/3ML) 0.083% nebulizer solution SMARTSIG:1 Vial(s) Via Nebulizer Every 6-8 Hours PRN 01/29/20   [provider]  citalopram  (CELEXA) 20 MG tablet Take 10mg  (1/2 tablet) daily x 1 week, then 20mg  (1 tablet) daily thereafter 03/19/20   Roma Schanz, CNM  etonogestrel-ethinyl estradiol (NUVARING) 0.12-0.015 MG/24HR vaginal ring Insert vaginally and leave in place for 3 consecutive weeks, then remove for 1 week. 03/19/20   Roma Schanz, CNM    Allergies    Dilaudid [hydromorphone], Keflex [cephalexin], Other,  and Adhesive [tape]  Review of Systems   Review of Systems  All other systems reviewed and are negative. Ten systems reviewed and are negative for acute change, except as noted in the HPI.    Physical Exam Updated Vital Signs BP 120/67 (BP Location: Right Arm)   Pulse 80   Temp 98.1 F (36.7 C) (Oral)   Resp 18   Ht 4\' 11"  (1.499 m)   Wt 84.4 kg   LMP 03/19/2020   SpO2 99%   BMI 37.57 kg/m   Physical Exam Vitals and nursing note reviewed.  Constitutional:      General: She is not in acute distress.    Appearance: Normal appearance. She is well-developed and normal weight. She is not ill-appearing, toxic-appearing or diaphoretic.  HENT:     Head: Normocephalic and atraumatic.     Comments: Please see image below for right side of scalp.    Right Ear: External ear normal.     Left Ear: External ear normal.     Ears:     Comments: Right external ear appears normal.  Right EAC is erythematous.  No discharge.  Right TM is nonbulging with what appears to be blood behind the TM.  This was discussed with the patient as well as her pediatric ENT who states that this is a chronic finding.  Left external ear, EAC, TM appears normal.    Nose: Nose normal.     Mouth/Throat:     Mouth: Mucous membranes are moist.     Pharynx: Oropharynx is clear. No oropharyngeal exudate or posterior oropharyngeal erythema.  Eyes:     Extraocular Movements: Extraocular movements intact.     Right eye: Normal extraocular motion and no nystagmus.     Left eye: Normal extraocular motion and no nystagmus.      Pupils: Pupils are equal, round, and reactive to light.     Right eye: Pupil is round and reactive.     Left eye: Pupil is round and reactive.  Cardiovascular:     Rate and Rhythm: Normal rate and regular rhythm.     Pulses: Normal pulses.     Heart sounds: Normal heart sounds. No murmur heard.  No friction rub. No gallop.   Pulmonary:     Effort: Pulmonary effort is normal. No respiratory distress.     Breath sounds: Normal breath sounds. No stridor. No wheezing, rhonchi or rales.  Abdominal:     General: Abdomen is flat.     Palpations: Abdomen is soft.     Tenderness: There is no abdominal tenderness.  Musculoskeletal:        General: Normal range of motion.     Cervical back: Normal range of motion and neck supple. No tenderness.  Skin:    General: Skin is warm and dry.  Neurological:     General: No focal deficit present.     Mental Status: She is alert and oriented to person, place, and time.     GCS: GCS eye subscore is 4. GCS verbal subscore is 5. GCS motor subscore is 6.  Psychiatric:        Mood and Affect: Mood normal.        Behavior: Behavior normal.     ED Results / Procedures / Treatments   Labs (all labs ordered are listed, but only abnormal results are displayed) Labs Reviewed  COMPREHENSIVE METABOLIC PANEL - Abnormal; Notable for the following components:      Result Value  CO2 21 (*)    All other components within normal limits  URINALYSIS, ROUTINE W REFLEX MICROSCOPIC - Abnormal; Notable for the following components:   APPearance CLOUDY (*)    Hgb urine dipstick SMALL (*)    Leukocytes,Ua LARGE (*)    Bacteria, UA RARE (*)    All other components within normal limits  CBC WITH DIFFERENTIAL/PLATELET  LACTIC ACID, PLASMA  I-STAT BETA HCG BLOOD, ED ([facility], [facility], [facility] ONLY)   EKG None  Radiology CT Head W or Wo Contrast  Result Date: 04/07/2020 CLINICAL DATA:  Headache. Right scalp infection. Cochlear implant on the right. EXAM: CT HEAD WITHOUT AND  WITH CONTRAST TECHNIQUE: Contiguous axial images were obtained from the base of the skull through the vertex without and with intravenous contrast CONTRAST:  42mL OMNIPAQUE IOHEXOL 300 MG/ML  SOLN COMPARISON:  CT head 01/09/2014 FINDINGS: Brain: No evidence of acute infarction, hemorrhage, hydrocephalus, extra-axial collection or mass lesion/mass effect. Normal enhancement. Vascular: Negative for hyperdense vessel Skull: No acute abnormality. Implant in the right parietal bone without significant surrounding soft tissue edema or fluid collection. Sinuses/Orbits: Paranasal sinuses clear. Right mastoidectomy. Opacification right middle ear. Left mastoid sinus and middle ear clear. Other: None IMPRESSION: No significant intracranial abnormality Right mastoidectomy with opacification of the right middle ear and mastoid sinus. Electronically Signed   By: Franchot Gallo M.D.   On: 04/07/2020 18:37   CT Soft Tissue Neck W Contrast  Result Date: 04/07/2020 CLINICAL DATA:  Neck pain.  Right-sided scalp infection. EXAM: CT NECK WITH CONTRAST TECHNIQUE: Multidetector CT imaging of the neck was performed using the standard protocol following the bolus administration of intravenous contrast. CONTRAST:  72mL OMNIPAQUE IOHEXOL 300 MG/ML  SOLN COMPARISON:  CT neck 10/20/2016 FINDINGS: Pharynx and larynx: Mild prominence of the tonsils bilaterally, improved from the prior study. No mass or abscess. Normal airway. Epiglottis and larynx normal. Salivary glands: Fatty parotid gland bilaterally without edema or mass. Normal submandibular gland bilaterally. Thyroid: 5 mm cyst in the right lobe of the thyroid not visualized previously. No further imaging necessary. Lymph nodes: No enlarged lymph nodes in the neck. Right level 2 lymph node 8 mm. Left level 2 lymph node 11 mm in diameter. Vascular: Normal vascular enhancement. Limited intracranial: Negative Visualized orbits: Negative Mastoids and visualized paranasal sinuses: Mild  mucosal edema left maxillary sinus. Remaining paranasal sinuses clear. Right mastoidectomy with complete opacification of the middle ear unchanged from prior study. Left mastoid sinus and middle ear clear. Skeleton: Negative Upper chest: Lung apices clear bilaterally. Other: None IMPRESSION: No acute abnormality Mild prominence of the tonsils bilaterally, improved since 2018. Reactive lymph nodes in the neck bilaterally. Right mastoidectomy with opacification right middle ear, chronic Electronically Signed   By: Franchot Gallo M.D.   On: 04/07/2020 18:20    Procedures .Marland KitchenIncision and Drainage  Date/Time: 04/07/2020 8:04 PM Performed by: Rayna Sexton, PA-C Authorized by: Rayna Sexton, PA-C   Consent:    Consent obtained:  Verbal   Consent given by:  Patient   Risks discussed:  Bleeding, incomplete drainage and pain   Alternatives discussed:  No treatment Location:    Type:  Fluid collection   Size:  0.5 cm   Location:  Head   Head location:  Scalp (right scalp overlying BAHA implant) Pre-procedure details:    Skin preparation:  Antiseptic wash Anesthesia (see MAR for exact dosages):    Anesthesia method:  Local infiltration   Local anesthetic:  Lidocaine 1% w/o epi Procedure type:  Complexity:  Simple Procedure details:    Needle aspiration: no     Incision types:  Stab incision   Incision depth:  Dermal   Scalpel blade:  11   Drainage:  Bloody and purulent   Drainage amount:  Scant   Wound treatment:  Wound left open   Packing materials:  None Post-procedure details:    Patient tolerance of procedure:  Tolerated well, no immediate complications   (including critical care time)  Medications Ordered in ED Medications  morphine 2 MG/ML injection 2 mg (2 mg Intravenous Given 04/07/20 1605)  HYDROcodone-acetaminophen (NORCO/VICODIN) 5-325 MG per tablet 1 tablet (1 tablet Oral Given 04/07/20 1708)  iohexol (OMNIPAQUE) 300 MG/ML solution 75 mL (75 mLs Intravenous  Contrast Given 04/07/20 1734)  lidocaine (PF) (XYLOCAINE) 1 % injection 2 mL (2 mLs Infiltration Given by Other 04/07/20 2012)   ED Course  I have reviewed the triage vital signs and the nursing notes.  Pertinent labs & imaging results that were available during my care of the patient were reviewed by me and considered in my medical decision making (see chart for details).  Clinical Course as of Apr 07 2017  Mon Apr 07, 2020  1242 I spoke to Dr. Farrel Gordon with pediatric ENT at [facility].  Does not feel that patient's surgical history provides a plausible explanation for the symptoms down her head and neck as well as her right eye blurry vision.  Feels that imaging is warranted.  Consideration for transfer to [facility] is reasonable if we feel that further w/u is warranted.    [LJ]  1509 I spoke to Dr. Anthoney Harada with pediatrics ENT at [facility]. Feels that CT imaging of the head and neck with contrast will suffice given her sx today. If negative, f/u outpatient with Dr. Farrel Gordon. He will also reach out to Dr. Farrel Gordon and update her regarding the patient.   [LJ]  9024 MRI states that they will not perform an MRI on this patient due to unknown medical history and possible unknown hardware in place.    [LJ]  1848 IMPRESSION: No acute abnormality  Mild prominence of the tonsils bilaterally, improved since 2018. Reactive lymph nodes in the neck bilaterally.  Right mastoidectomy with opacification right middle ear, chronic  CT Soft Tissue Neck W Contrast [LJ]  1848 IMPRESSION: No significant intracranial abnormality  Right mastoidectomy with opacification of the right middle ear and mastoid sinus.  CT Head W or Wo Contrast [LJ]    Clinical Course User Index [LJ] Rayna Sexton, PA-C   MDM Rules/Calculators/A&P                          Patient is a 24 year old female with a history of BAHA implant on the right side.  Patient has been having recurrent issues with tissue overgrowth and infection at the site.  She  was scheduled to have an MRI tomorrow morning at [facility] in [place] where she is followed by pediatric ENT but came in today due to worsening pain and some mild blurry vision in the right eye.   Patient was initially discussed with Dr. Farrel Gordon who recommended imaging and workup on the patient given her new sx this AM as well as possible transfer to [facility].  Unfortunately given patient's significant medical history and surgical history with implants to the head, our MRI team would not allow the patient to move forward with MRI.  She was discussed with the on-call pediatric ENT at [facility].  They recommended CT scan with contrast of the head and neck.  If negative, they feel comfortable with patient being discharged and following up outpatient.  CT scan of the head and neck was obtained with findings as noted above.  There were no acute abnormalities appreciated.  Both images were independently reviewed as well as interpreted.  Findings were discussed with the patient.  She was relieved.  Basic labs obtained which were reassuring.  CBC without leukocytosis or neutrophilia.  CMP with a mildly decreased CO2 at 21 but otherwise benign.  No elevation in lactic acid.  UA shows small hemoglobin, large leukocytes, rare bacteria, 21-50 white blood cells.  Patient denies any dysuria but notes a recent cottage cheeselike vaginal discharge.  Patient has been on multiple antibiotics recently and is currently taking doxycycline.  Will prescribe patient a dose of Diflucan for likely yeast infection.   Dr. Farrel Gordon stated that it was reasonable to perform an I&D on the site if any fluctuance.  There does appear to be a small amount of fluctuance and patient requested that I perform an I&D.  This was performed with procedure note as noted above.  Patient tolerated this quite well.  Small amount of discharge noted from the site.  Wound left open.  Patient is currently taking doxycycline and recommended that she continue to take  this.  She is going to follow-up with pediatric ENT tomorrow.  Understands to return to the ED with new or worsening symptoms.  Her questions were answered and she was amicable at the time of discharge.  Her vital signs are stable.  Final Clinical Impression(s) / ED Diagnoses Final diagnoses:  Infection of scalp   Rx / DC Orders ED Discharge Orders         Ordered    fluconazole (DIFLUCAN) 200 MG tablet   Once        04/07/20 2016           Rayna Sexton, PA-C 04/07/20 2018    Hayden Rasmussen, MD 04/08/20 [phone]

## 2020-04-16 ENCOUNTER — Telehealth: Payer: Self-pay | Admitting: Obstetrics & Gynecology

## 2020-04-16 NOTE — Telephone Encounter (Signed)
Pt needs a refill on her Nuvaring, appt here 12/27 to discuss [place] told her to contact us  [facility]  Please advise & notify pt

## 2020-04-17 ENCOUNTER — Ambulatory Visit: Payer: Medicaid Other | Admitting: Obstetrics & Gynecology

## 2020-04-28 ENCOUNTER — Emergency Department (HOSPITAL_COMMUNITY)
Admission: EM | Admit: 2020-04-28 | Discharge: 2020-04-28 | Disposition: A | Discharge status: Home / Self Care | Payer: Medicaid Other | Attending: Emergency Medicine | Admitting: Emergency Medicine

## 2020-04-28 ENCOUNTER — Emergency Department (HOSPITAL_COMMUNITY): Payer: Medicaid Other

## 2020-04-28 ENCOUNTER — Other Ambulatory Visit: Payer: Self-pay

## 2020-04-28 ENCOUNTER — Encounter (HOSPITAL_COMMUNITY): Payer: Self-pay | Admitting: *Deleted

## 2020-04-28 DIAGNOSIS — F17210 Nicotine dependence, cigarettes, uncomplicated: Secondary | ICD-10-CM | POA: Insufficient documentation

## 2020-04-28 DIAGNOSIS — M25561 Pain in right knee: Secondary | ICD-10-CM | POA: Insufficient documentation

## 2020-04-28 DIAGNOSIS — J4520 Mild intermittent asthma, uncomplicated: Secondary | ICD-10-CM | POA: Diagnosis not present

## 2020-04-28 MED ORDER — DICLOFENAC SODIUM 1 % EX GEL: 2.0000 g | Freq: Four times a day (QID) | CUTANEOUS | 0 refills | Status: DC

## 2020-04-28 MED ORDER — KETOROLAC TROMETHAMINE 60 MG/2ML IM SOLN
30.0000 mg | Freq: Once | INTRAMUSCULAR | Status: AC
  Administered 2020-04-28: 15:00:00 30 mg via INTRAMUSCULAR
  Filled 2020-04-28: qty 2

## 2020-04-28 NOTE — ED Provider Notes (Signed)
[facility] EMERGENCY DEPARTMENT Provider Note   CSN: [id number] Arrival date & time: 04/28/20  1132     History Chief Complaint  Patient presents with  . Knee Pain    Stacey Cook is a 24 y.o. female with no relevant past medical history presents the ED with a 2-week history of right knee pain.  Patient reports that she has a 41-month old newborn child at home. For the past couple of weeks, she has been experiencing increasing right knee discomfort and swelling. She also feels as though so there is a knot in her popliteal region. She is taking oral contraceptives. No history of clots or clotting disorder. She states that her knee also feels as though it "locks up". While she denies any obvious injury, she does admit that she is a sleepwalker and cannot deny the possibility of twisting in the middle of the night. She states that she has been trying to take Tylenol and ibuprofen, with little relief. She then tells me that she had a missed tibial fracture for several years and is unsure if that could be contributing factor. She tells me that she has liver and kidney problems. I reviewed her most recent CMP obtained 04/07/2020 and her renal function and liver enzymes were entirely within normal limits.  She denies any fevers, chills, recent G/C, history of IVDA or malignancy, numbness or weakness, or other symptoms.   HPI     Past Medical History:  Diagnosis Date  . Abdominal pain   . Anemia   . Anginal pain ([place])   . Anxiety   . Arthritis    knees  . Asthma   . Cervicalgia   . Cholestasis during pregnancy   . Chronic abdominal pain   . Complication of anesthesia    light headed  . Constipation   . Ear mass   . Gestational diabetes    pt states not been checking sugars regularly at home; nor has she been taking her Metformin  . Headache   . 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 HSV infection   . Low iron   . Mental disorder   . PONV (postoperative  nausea and vomiting)   . Suicidal intent   . UTI (lower urinary tract infection) 05/2014  . Vomiting     Patient Active Problem List   Diagnosis Date Noted  . Cellulitis of scalp 04/02/2019  . Depression, postpartum 02/09/2019  . Epidermal cyst 02/09/2019  . Status post cesarean section 01/03/2019  . 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 Gastroesophageal reflux disease   . Pancreatitis 01/12/2018  . History of shoulder dystocia in prior pregnancy 12/02/2017  . History of postpartum hemorrhage 12/02/2017  . History of cholestasis during pregnancy 09/21/2017  . History of gestational diabetes 08/24/2017  . Chest pain due to GERD 08/05/2017  . Depression with anxiety and suicidal ideations 06/13/2017  . History of miscarriage 01/18/2017  . Conductive hearing loss, unilat, unrestrict hearing contralateral side 12/10/2015  . 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 Asthma, mild intermittent 02/18/2015  . 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 Perforation of left tympanic membrane 06/19/2014  . ADHD (attention deficit hyperactivity disorder), combined type 08/01/2012  . ODD (oppositional defiant disorder) 08/01/2012  . Vasovagal syncope  02/28/2012  . Cholesteatoma of attic of right ear 02/15/2011    Past Surgical History:  Procedure Laterality Date  . CESAREAN SECTION N/A 01/03/2019   Procedure: CESAREAN SECTION;  Surgeon: [name], MD;  Location: [facility];  Service: Obstetrics;  Laterality: N/A;  . CHOLECYSTECTOMY N/A 01/16/2018   Procedure: LAPAROSCOPIC CHOLECYSTECTOMY;  Surgeon: Aviva Signs, MD;  Location: [facility];  Service: General;  Laterality: N/A;  per Dr. Arnoldo Morale, pt will most likely go home and he will tell her to arrive at 10:45 - already has labs  . CHOLESTEATOMA EXCISION     . COCHLEAR IMPLANT Right   . DILATION AND EVACUATION N/A 09/11/2016   Procedure: DILATATION AND CURRATAGE  2ND TRIMESTER;  Surgeon: Jonnie Kind, MD;  Location: [facility];  Service: Gynecology;  Laterality: N/A;  . IMPLANTATION BONE ANCHORED HEARING AID Right 04/2013  . INNER EAR SURGERY Right 03/31/2018  . 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 TYMPANOPLASTY Left   . TYMPANOSTOMY       OB History    Gravida  5   Para  3   Term  3   Preterm  0   AB  2   Living  3     SAB  2   IAB  0   Ectopic  0   Multiple  0   Live Births  3           Family History  Problem Relation Age of Onset  . Cholelithiasis Mother   . Kidney disease Mother        stones  . Depression Mother   . Hypertension Maternal Grandmother   . Diabetes Maternal Grandmother   . Stroke Maternal Grandmother   . Seizures Maternal Grandmother   . Asthma Maternal Grandmother   . Hyperlipidemia Maternal Grandmother   . Thyroid disease Maternal Grandmother   . Cancer Maternal Grandmother   . Asthma Brother   . Seizures Maternal Uncle   . Cancer Other        breast- great aunt  . Celiac disease Neg Hx     Social History   Tobacco Use  . Smoking status: Current Every Day Smoker    Packs/day: 0.50    Years: 1.00    Pack years: 0.50    Types: Cigarettes    Last attempt to quit: 01/27/2020    Years since quitting: 0.2  . Smokeless tobacco: Never Used  . Tobacco comment: "3-4 cigs a day "  Vaping Use  . Vaping Use: Former  Substance Use Topics  . Alcohol use: Yes    Alcohol/week: 0.0 standard drinks  . Drug use: No    Home Medications Prior to Admission medications   Medication Sig Start Date End Date Taking? Authorizing Provider  acetaminophen (TYLENOL) 325 MG tablet Take 2 tablets (650 mg total) by mouth every 6 (six) hours as needed for moderate pain. 02/14/20   Tilden Dome, MD  albuterol (PROVENTIL) (2.5 MG/3ML) 0.083% nebulizer solution SMARTSIG:1 Vial(s)  Via Nebulizer Every 6-8 Hours PRN 01/29/20   [provider]  citalopram (CELEXA) 20 MG tablet Take 10mg  (1/2 tablet) daily x 1 week, then 20mg  (1 tablet) daily thereafter 03/19/20   Roma Schanz, CNM  diclofenac Sodium (VOLTAREN) 1 % GEL Apply 2 g topically 4 (four) times daily. 04/28/20   Corena Herter, PA-C  etonogestrel-ethinyl estradiol (NUVARING) 0.12-0.015 MG/24HR vaginal ring Insert vaginally and leave in place  for 3 consecutive weeks, then remove for 1 week. 03/19/20   Roma Schanz, CNM    Allergies    Dilaudid [hydromorphone], Keflex [cephalexin], Other, and Adhesive [tape]  Review of Systems   Review of Systems  All other systems reviewed and are negative.   Physical Exam Updated Vital Signs BP 93/77 (BP Location: Right Arm)   Pulse 85   Temp 97.9 F (36.6 C) (Oral)   Resp 18   Ht 4' 11.5" (1.511 m)   Wt 38.6 kg   LMP 04/21/2020   SpO2 97%   BMI 16.88 kg/m   Physical Exam Vitals and nursing note reviewed. Exam conducted with a chaperone present.  Constitutional:      Appearance: Normal appearance.  HENT:     Head: Normocephalic and atraumatic.  Eyes:     General: No scleral icterus.    Conjunctiva/sclera: Conjunctivae normal.  Cardiovascular:     Rate and Rhythm: Normal rate.     Pulses: Normal pulses.  Pulmonary:     Effort: Pulmonary effort is normal.  Musculoskeletal:     Comments: Right knee: No significant swelling relative to contralateral leg.  No obvious popliteal mass.  Pedal pulse intact and symmetric with contralateral foot.  No overlying skin changes.  Generalized TTP over knee.  ROM intact, albeit limited due to pain.  180 degrees extension and 90 degrees flexion.  Positive Homan's sign with pain elicited in right calf.  No significant asymmetric swelling.  No pitting edema.  Neurovascularly intact.  Skin:    General: Skin is dry.     Capillary Refill: Capillary refill takes less than 2 seconds.  Neurological:     Mental  Status: She is alert and oriented to person, place, and time.     GCS: GCS eye subscore is 4. GCS verbal subscore is 5. GCS motor subscore is 6.  Psychiatric:        Mood and Affect: Mood normal.        Behavior: Behavior normal.        Thought Content: Thought content normal.     ED Results / Procedures / Treatments   Labs (all labs ordered are listed, but only abnormal results are displayed) Labs Reviewed - No data to display  EKG None  Radiology US Venous Img Lower Unilateral Right  Result Date: 04/28/2020 CLINICAL DATA:  RIGHT lower extremity swelling for 2 weeks with reported prior history of DVT, associated with pain and edema in this patient on hormonal therapy at age 6. EXAM: RIGHT LOWER EXTREMITY VENOUS DOPPLER ULTRASOUND TECHNIQUE: Gray-scale sonography with compression, as well as color and duplex ultrasound, were performed to evaluate the deep venous system(s) from the level of the common femoral vein through the popliteal and proximal calf veins. COMPARISON:  None. FINDINGS: VENOUS Normal compressibility of the common femoral, superficial femoral, and popliteal veins, as well as the visualized calf veins. Visualized portions of profunda femoral vein and great saphenous vein unremarkable. No filling defects to suggest DVT on grayscale or color Doppler imaging. Doppler waveforms show normal direction of venous flow, normal respiratory plasticity and response to augmentation. Limited views of the contralateral common femoral vein are unremarkable. OTHER None. Limitations: none IMPRESSION: Negative for RIGHT lower extremity DVT. Electronically Signed   By: Zetta Bills M.D.   On: 04/28/2020 14:39   DG Knee Complete 4 Views Right  Result Date: 04/28/2020 CLINICAL DATA:  Two week history of knee swelling. EXAM: RIGHT KNEE - COMPLETE 4+ VIEW COMPARISON:  None. FINDINGS: No evidence of fracture, dislocation, or joint effusion. No evidence of arthropathy or other focal bone  abnormality. Soft tissues are unremarkable. IMPRESSION: Negative. Electronically Signed   By: Kerby Moors M.D.   On: 04/28/2020 13:19    Procedures Procedures (including critical care time)  Medications Ordered in ED Medications  ketorolac (TORADOL) injection 30 mg (has no administration in time range)    ED Course  I have reviewed the triage vital signs and the nursing notes.  Pertinent labs & imaging results that were available during my care of the patient were reviewed by me and considered in my medical decision making (see chart for details).    MDM Rules/Calculators/A&P                          Patient is here for questionably atraumatic right knee pain.  She is neurovascularly intact.  ROM intact, albeit limited due to patient's discomfort.  Compartments are soft.  Low suspicion for septic arthritis, inflammatory arthritis, compartment syndrome, or arterial thrombosis.  Self-described lump in back of knee suggestive of possible Baker's cyst, but nothing significant on my exam.  No significant asymmetries on my evaluation.    Plain films are obtained of right knee and personally reviewed and demonstrate no acute abnormalities.  Given that patient just had minor surgery 04/18/2019 morning conjunction with her OCP use and reports of atraumatic right calf/knee swelling and discomfort, worse with ambulation, will obtain DVT ultrasound.  She is adamant that she is not pregnant.  Will provide Toradol here in the ER for her pain symptoms.  DVT study was also negative here in the ED.  Cause of her discomfort is unclear.  Will refer to her orthopedist, Dr. Aline Brochure, for ongoing evaluation management.  She declined a brace because she has one at home.  Will provide crutches.  ED return precautions discussed.  Patient voices understanding and is agreeable to the plan.  Final Clinical Impression(s) / ED Diagnoses Final diagnoses:  Acute pain of right knee    Rx / DC Orders ED  Discharge Orders         Ordered    diclofenac Sodium (VOLTAREN) 1 % GEL  4 times daily        04/28/20 1512           Reita Chard 04/28/20 1512    Margette Fast, MD 04/29/20 [phone]

## 2020-04-28 NOTE — ED Triage Notes (Signed)
Right knee pain with swelling for 2 weeks

## 2020-04-28 NOTE — Discharge Instructions (Addendum)
Please follow-up with your primary care provider regarding today's encounter.  I would also like for you to call your orthopedist, Dr. Aline Brochure, to schedule appointment for further management.  Your x-ray and DVT study here in the ED was entirely unremarkable.  However, cannot exclude ligamentous or tendinous injury.  He will need to follow-up with orthopedics for ongoing evaluation.  In the interim, please continue use the crutches.  Continue with over-the-counter medications as needed for discomfort.  I have also prescribed you Voltaren gel which you can apply to the affected area for additional pain relief.   Please return to the ED or seek immediate medical attention should you experience any new or worsening symptoms.

## 2020-05-05 ENCOUNTER — Ambulatory Visit: Payer: Medicaid Other | Admitting: Obstetrics & Gynecology

## 2020-05-07 NOTE — BH Specialist Note (Signed)
Integrated Behavioral Health via Telemedicine Visit  05/07/2020 Stacey Cook [id number]  Number of Integrated Behavioral Health visits: 1 Session Start time: 10:19  Session End time: 10:49 Total time: 30  Referring Provider: Shawna Clamp, CNM Patient/Family location: Home [facility] Provider location: [facility]  All persons participating in visit: [name] and [facility] Stacey Cook   Types of Service: Telephone visit  I connected with Stacey Cook and/or [employer] n/a by Telephone  (Video is Surveyor, mining) and verified that I am speaking with the correct person using two identifiers.Discussed confidentiality: Yes   I discussed the limitations of telemedicine and the availability of in person appointments.  Discussed there is a possibility of technology failure and discussed alternative modes of communication if that failure occurs.  I discussed that engaging in this telemedicine visit, they consent to the provision of behavioral healthcare and the services will be billed under their insurance.  Patient and/or legal guardian expressed understanding and consented to Telemedicine visit: Yes   Presenting Concerns: Patient and/or family reports the following symptoms/concerns:Pt states her primary concern today is escalating anxiety, depression, anger and feeling overwhelmed after experiencing unexpected loss of beloved sister-in-law on 05/05/20, and positive covid for herself and 2yo son, while off her BH medications (is taking Celexa currently) Pt would like to start back on medication to treat previous diagnoses of bipolar disorder, ADHD and borderline personality disorder; was taking Vyvanse, Lexapro, Trazadone, Trileptal. Pt was previously seeing a therapist and psychiatrist at [facility] until she aged out, and PCP says she needs psychiatry to manage medication. Pt denies current SI, no HI, no intent, no plan.   Duration of problem: Ongoing; Severity of problem: moderately severe  Patient and/or Family's Strengths/Protective Factors: Social connections and Sense of purpose  Goals Addressed: Patient will: 1.  Reduce symptoms of: anxiety, depression, mood instability and stress  2.  Demonstrate ability to: Increase healthy adjustment to current life circumstances and Continue healthy grieving over loss  Progress towards Goals: Ongoing  Interventions: Interventions utilized:  Medication Monitoring, Psychoeducation and/or Health Education and Link to [facility] Standardized Assessments completed: C-SSRS Short, GAD-7 and PHQ 9  Patient and/or Family Response: Pt agrees to treatment plan  Assessment: Patient currently experiencing Grief today; Bipolar affective disorder, ADHD, Borderline personality disorder (as previously diagnosed via psychiatry).   Patient may benefit from psychoeducation and brief therapeutic interventions regarding coping with symptoms of anxiety, depression, anger, life stress, and grief .  Plan: 1. Follow up with behavioral health clinician on : Two weeks; call Asher Muir at [phone] if any questions prior to scheduled visit 2. Behavioral recommendations:  -Continue taking Celexa as prescribed -Call [facility] number (on After Visit Summary) to find options for psychiatry with current insurance -If needed, use [facility] in [place] for 24/7 for urgent BH issues (ex. Suicidal ideation) -Continue using coping strategies that have been helpful in the past, as needed -Consider establishing with ongoing therapist of choice (list of some possible available resources on After Visit Summary) -Consider additional information shared on After Visit Summary as needed (grief support for the whole family, tips for coping with panic, apps for self-care) 3. Referral(s): Integrated Art gallery manager (In Clinic), Community Mental Health Services (LME/Outside Clinic)  and Community Resources:  Grief support; [place] for psychiatry  I discussed the assessment and treatment plan with the patient and/or parent/guardian. They were provided an opportunity to ask questions and all were answered. They  agreed with the plan and demonstrated an understanding of the instructions.   They were advised to call back or seek an in-person evaluation if the symptoms worsen or if the condition fails to improve as anticipated.  Garlan Fair, LCSW   Depression screen [facility] 2/9 05/15/2020 11/28/2019 08/23/2019 06/06/2018  Decreased Interest 2 2 1 2   Down, Depressed, Hopeless 3 1 1 3   PHQ - 2 Score 5 3 2 5   Altered sleeping 1 3 3 3   Tired, decreased energy 1 3 3 3   Change in appetite 2 1 2 2   Feeling bad or failure about yourself  3 1 1 1   Trouble concentrating 1 2 1 1   Moving slowly or fidgety/restless 1 0 0 1  Suicidal thoughts 3 0 0 1  PHQ-9 Score 17 13 12 17   Difficult doing work/chores - Somewhat difficult Somewhat difficult -  Some recent data might be hidden   GAD 7 : Generalized Anxiety Score 05/15/2020 11/28/2019 08/23/2019  Nervous, Anxious, on Edge 1 3 1   Control/stop worrying 1 1 2   Worry too much - different things 3 1 2   Trouble relaxing 1 2 3   Restless 0 2 1  Easily annoyed or irritable 3 3 2   Afraid - awful might happen 3 0 2  Total GAD 7 Score 12 12 13   Anxiety Difficulty - Very difficult Somewhat difficult

## 2020-05-10 HISTORY — PX: COCHLEAR IMPLANT REMOVAL: SHX1365

## 2020-05-15 ENCOUNTER — Ambulatory Visit (INDEPENDENT_AMBULATORY_CARE_PROVIDER_SITE_OTHER): Payer: Medicaid Other | Admitting: Clinical

## 2020-05-15 DIAGNOSIS — F53 Postpartum depression: Secondary | ICD-10-CM

## 2020-05-15 DIAGNOSIS — Z8659 Personal history of other mental and behavioral disorders: Secondary | ICD-10-CM

## 2020-05-15 DIAGNOSIS — F3160 Bipolar disorder, current episode mixed, unspecified: Secondary | ICD-10-CM

## 2020-05-15 DIAGNOSIS — F603 Borderline personality disorder: Secondary | ICD-10-CM

## 2020-05-15 DIAGNOSIS — O99345 Other mental disorders complicating the puerperium: Secondary | ICD-10-CM | POA: Diagnosis not present

## 2020-05-15 DIAGNOSIS — F4321 Adjustment disorder with depressed mood: Secondary | ICD-10-CM

## 2020-05-15 NOTE — Patient Instructions (Addendum)
Center for [facility] at [facility] [place], [place] [address] [phone] (main office) [phone] (Demontez Novack's office)  [facility] (Individual and group grief support)  [facility] (24/7 Walk-in): [phone]   [address] [place], [place] [address] - 24 Hour Crisis Line: [phone]  -Venetia Constable [phone] (Call this number to find local psychiatry options with your insurance) -[place] [phone]    What if I or someone I know is in crisis?  . If you are thinking about harming yourself or having thoughts of suicide, or if you know someone who is, seek help right away.  . Call your doctor or mental health care provider.  . Call 911 or go to a hospital emergency room to get immediate help, or ask a friend or family member to help you do these things; IF YOU ARE IN [place], YOU MAY GO TO WALK-IN URGENT CARE 24/7 at [facility] (see below)  . Call the [place] National Suicide Prevention Lifeline's toll-free, 24-hour hotline at 1-800-273-TALK 801-061-7537) or TTY: 1-800-799-4 TTY 724 801 4436) to talk to a trained counselor.  . If you are in crisis, make sure you are not left alone.   . If someone else is in crisis, make sure he or she is not left alone   24 Hour :   [facility]  [address] [place], [place] [address] [phone] or [place] 24/7  [facility] Mobile Crisis: [phone]  [place] National Suicide Hotline: (475)145-6369  [facility] [employer] (Domestic Violence, Rape & Victim Assistance)  (661)601-7645  [place]  [address], [place]  [address]   [phone] or [phone]   [place]: [phone] (8am-4pm) or [phone] (after hours)        [facility], [address], [place], [place] Fax: [phone] [email or web address] *Interpreters available *Accepts all insurance and uninsured for Urgent Care needs *Accepts Medicaid and uninsured for outpatient treatment   [facility]   Mon-Fri: 8am-5pm [place], [place], [place]); [phone]) [email or web address]  *Accepts Medicare  [facility] [place], Fri: 8am-4pm [place], [place], [place] [address] [phone] (phone); [phone] (fax) [email or web address]  *[place] Mon-Fri: 9am-5pm  [address] [place], [place] (phone); [phone]  [email or web address]  *Accepts Medicaid  [facility]inny Bl[facility] [address] Johnette Abraham [place], [place] [email or web address]   [facility] [place], 8:30am-12pm/1pm-2:30pm [address] [place], [place] (phone); [phone] (fax) [email or web address]  *Accepts Medicaid, sliding-scale*Bilingual services available  [facility] Mon-Fri, 8am-7pm [place], [place]  [phone](phone); [phone]) [email or web address].[email or web address]  *Accepts Medicaid *Bilingual services available  [facility] Mon-Fri: 8am-5pm, Saturday by appointment only [place], [place], [place] (phone); [phone] (fax) [email or web address]   [facility] [address] [place], [place], [place] [email or web address]  *Free & reduced services for uninsured and underinsured individuals *Bilingual services for Spanish-speaking clients 21 and under  [place] Mon-Fri: 9am-5:30pm [address], [place], [place], [place] (phone), [phone] (fax) After hours crisis line: [phone] [email or web address]  *Accepts Medicare and Medicaid  [facility], 8am-6pm [address], [place], [place] (phone); [phone] (fax) [email or web address]  *  Subsidized costs available  [facility] Mon-Fri: 8am-4pm [address], [place], [place] [phone](GMWNU); [phone](fax) [email or web address]  *Accepts Medicaid  [facility] Same day access hours: Mon-Fri, 8:30-3pm Crisis hours: Mon-Fri, 8am-5pm [address], [place], [place] [phone]  [facility] Same day access hours: Mon-Fri, 8:30-3pm Crisis hours: Mon-Fri, 8am-8pm [address], [place], [place] [phone] (phone); [phone] (fax) [email or web address]  *Accepts Medicaid and Medicare  [facility] [place], [place], Fri: 9am-9pm Tues, Thurs: 9am-6pm [address] [place], [place], [place]  [phone] (phone); [phone] (fax) [email or web address]  *(Accepts Medicare and Medicaid; payment plans available)*Bilingual services available  [facility] [address] [place], [place] [phone] (phone); [phone] (fax) [email or web address]   [facility] [address], [place], [place]  [phone]  [email or web address]  *Bilingual services available  [facility]) Mon-Thurs: 8am-8pm [address], [place], [place] [phone] (phone); [phone] (fax) [email or web address]  *Accepts Medicaid*Bilingual services available  [facility] [address] [place], [place] [phone] (phone) [email or web address]  *Accepts Medicaid *Bilingual services available  [facility] Mon-Fri,  9am-4:45pm [address] [place], [place] [phone] (phone); [phone] (fax) [email or web address]  *Accepts Medicare  [facility] Mon-Thurs: 8:30-8pm, Fri: 8:30am-7pm [address], [place], [place] (3rd floor) [phone] (phone); [phone] (fax) [email or web address]  *Accepts Medicaid; income-based reduced rates available  [facility] Mon-Fri: 8am-5pm [address], [place], [place] [address] [phone] (phone); [phone] (fax) [email or web address]  *Accepts Medicaid*Bilingual services available   [facility] [place])  [address] [place] [phone] [email or web address]  *Provides direct services to individuals in recovery from mental illness, including support groups, recovery skills classes, and one on one peer support  [employer] on Mental Illness) [place] helpline: (901)154-4418  [facility]MI [place] helpline: (530) 422-1986 https://[email or web address]  *A community hub for information relating to local resources and services for the friends and families of individuals living alongside a mental health condition, as well as the individuals themselves. Classes and support groups also provided   Coping with Panic Attacks   What is a panic attack?  You may have had a panic attack if you experienced four or more of the symptoms listed below coming on abruptly and peaking in about 10 minutes.  Panic Symptoms   . Pounding heart  . Sweating  . Trembling or shaking  . Shortness of breath  . Feeling of choking  . Chest pain  . Nausea or abdominal distress    . Feeling dizzy, unsteady, lightheaded, or faint  . Feelings of unreality or being detached from yourself  . Fear of losing control or going crazy  . Fear of dying  . Numbness or tingling  . Chills or hot flashes      Panic attacks are sometimes accompanied by avoidance of certain places or  situations. These are often situations that would be difficult to escape from or in which help might not be available. Examples might include crowded shopping malls, public transportation, restaurants, or driving.   Why do panic attacks occur?   Panic attacks are the body's alarm system gone awry. All of [name] have a built-in alarm system, powered by adrenaline, which increases our heart rate, breathing, and blood flow in response to danger. Ordinarily, this 'danger response system' works well. In some people, however, the response is either out of proportion to whatever stress is going on, or may come out of the blue without any stress at all.   For example, if  you are walking in the woods and see a bear coming your way, a variety of changes occur in your body to prepare you to either fight the danger or flee from the situation. Your heart rate will increase to get more blood flow around your body, your breathing rate will quicken so that more oxygen is available, and your muscles will tighten in order to be ready to fight or run. You may feel nauseated as blood flow leaves your stomach area and moves into your limbs. These bodily changes are all essential to helping you survive the dangerous situation. After the danger has passed, your body functions will begin to go back to normal. This is because your body also has a system for "recovering" by bringing your body back down to a normal state when the danger is over.   As you can see, the emergency response system is adaptive when there is, in fact, a "true" or "real" danger (e.g., bear). However, sometimes people find that their emergency response system is triggered in "everyday" situations where there really is no true physical danger (e.g., in a meeting, in the grocery store, while driving in normal traffic, etc.).   What triggers a panic attack?  Sometimes particularly stressful situations can trigger a panic attack. For example, an argument with your  spouse or stressors at work can cause a stress response (activating the emergency response system) because you perceive it as threatening or overwhelming, even if there is no direct risk to your survival.  Sometimes panic attacks don't seem to be triggered by anything in particular- they may "come out of the blue". Somehow, the natural "fight or flight" emergency response system has gotten activated when there is no real danger. Why does the body go into "emergency mode" when there is no real danger?   Often, people with panic attacks are frightened or alarmed by the physical sensations of the emergency response system. First, unexpected physical sensations are experienced (tightness in your chest or some shortness of breath). This then leads to feeling fearful or alarmed by these symptoms ("Something's wrong!", "Am I having a heart attack?", "Am I going to faint?") The mind perceives that there is a danger even though no real danger exists. This, in turn, activates the emergency response system ("fight or flight"), leading to a "full blown" panic attack. In summary, panic attacks occur when we misinterpret physical symptoms as signs of impending death, craziness, loss of control, embarrassment, or fear of fear. Sometimes you may be aware of thoughts of danger that activate the emergency response system (for example, thinking "I'm having a heart attack" when you feel chest pressure or increased heart rate). At other times, however, you may not be aware of such thoughts. After several incidences of being afraid of physical sensations, anxiety and panic can occur in response to the initial sensations without conscious thoughts of danger. Instead, you just feel afraid or alarmed. In other words, the panic or fear may seem to occur "automatically" without you consciously telling yourself anything.   After having had one or more panic attacks, you may also become more focused on what is going on inside your body.  You may scan your body and be more vigilant about noticing any symptoms that might signal the start of a panic attack. This makes it easier for panic attacks to happen again because you pick up on sensations you might otherwise not have noticed, and misinterpret them as something dangerous. A panic attack may then  result.      How do I cope with panic attacks?  An important part of overcoming panic attacks involves re-interpreting your body's physical reactions and teaching yourself ways to decrease the physical arousal. This can be done through practicing the cognitive and behavioral interventions below.   Research has found that over half of people who have panic attacks show some signs of hyperventilation or overbreathing. This can produce initial sensations that alarm you and lead to a panic attack. Overbreathing can also develop as part of the panic attack and make the symptoms worse. When people hyperventilate, certain blood vessels in the body become narrower. In particular, the brain may get slightly less oxygen. This can lead to the symptoms of dizziness, confusion, and lightheadedness that often occur during panic attacks. Other parts of the body may also get a bit less oxygen, which may lead to numbness or tingling in the hands or feet or the sensation of cold, clammy hands. It also may lead the heart to pump harder. Although these symptoms may be frightening and feel unpleasant, it is important to remember that hyperventilating is not dangerous. However, you can help overcome the unpleasantness of overbreathing by practicing Breathing Retraining.   Practice this basic technique three times a day, every day:  . Inhale. With your shoulders relaxed, inhale as slowly and deeply as you can while you count to six. If you can, use your diaphragm to fill your lungs with air.  . Hold. Keep the air in your lungs as you slowly count to four.  . Exhale. Slowly breath out as you count to six.  . Repeat.  Do the inhale-hold-exhale cycle several times. Each time you do it, exhale for longer counts.  Like any new skill, Breathing Retraining requires practice. Try practicing this skill twice a day for several minutes. Initially, do not try this technique in specific situations or when you become frightened or have a panic attack. Begin by practicing in a quiet environment to build up your skill level so that you can later use it in time of "emergency."   2. Decreasing Avoidance  Regardless of whether you can identify why you began having panic attacks or whether they seemed to come out of the blue, the places where you began having panic attacks often can become triggers themselves. It is not uncommon for individuals to begin to avoid the places where they have had panic attacks. Over time, the individual may begin to avoid more and more places, thereby decreasing their activities and often negatively impacting their quality of life. To break the cycle of avoidance, it is important to first identify the places or situations that are being avoided, and then to do some "relearning."  To begin this intervention, first create a list of locations or situations that you tend to avoid. Then choose an avoided location or situation that you would like to target first. Now develop an "exposure hierarchy" for this situation or location. An "exposure hierarchy" is a list of actions that make you feel anxious in this situation. Order these actions from least to most anxiety-producing. It is often helpful to have the first item on your hierarchy involve thinking or imagining part of the feared/avoided situation.   Here is an example of an exposure hierarchy for decreasing avoidance of the grocery store. Note how it is ordered from the least amount of anxiety (at the top) to the most anxiety (at the bottom):  Marland Kitchen Think about going to the grocery  store alone.  . Go to the grocery store with a friend or family member.  . Go to  the grocery store alone to pick up a few small items (5-10 minutes in the store).  . Shopping for 10-20 minutes in the store alone.  . Doing the shopping for the week by myself (20-30 minutes in the store).   Your homework is to "expose" yourself to the lowest item on your hierarchy and use your breathing relaxation and coping statements (see below) to help you remain in the situation. Practice this several times during the upcoming week. Once you have mastered each item with minimal anxiety, move on to the next higher action on your list.   Cognitive Interventions  1. Identify your negative self-talk Anxious thoughts can increase anxiety symptoms and panic. The first step in changing anxious thinking is to identify your own negative, alarming self-talk. Some common alarming thoughts:  . I'm having a heart attack.            . I must be going crazy. . I think I'm dying. Marland Kitchen People will think I'm crazy. . I'm going to pass our.  . Oh no- here it comes.  . I can't stand this.  Rene Paci got to get out of here!  2. Use positive coping statements Changing or disrupting a pattern of anxious thoughts by replacing them with more calming or supportive statements can help to divert a panic attack. Some common helpful coping statements:  . This is not an emergency.  . I don't like feeling this way, but I can accept it.  . I can feel like this and still be okay.  . This has happened before, and I was okay. I'll be okay this time, too.  . I can be anxious and still deal with this situation.   /Emotional Wellbeing Apps and Websites Here are a few free apps meant to help you to help yourself.  To find, try searching on the internet to see if the app is offered on Apple/Android devices. If your first choice doesn't come up on your device, the good news is that there are many choices! Play around with different apps to see which ones are helpful to you.    Calm This is an app meant to help increase calm  feelings. Includes info, strategies, and tools for tracking your feelings.      Calm Harm  This app is meant to help with self-harm. Provides many 5-minute or 15-min coping strategies for doing instead of hurting yourself.       [place] is a problem-solving tool to help deal with emotions and cope with stress you encounter wherever you are.      MindShift This app can help people cope with anxiety. Rather than trying to avoid anxiety, you can make an important shift and face it.      MY3  MY3 features a support system, safety plan and resources with the goal of offering a tool to use in a time of need.       My Life My Voice  This mood journal offers a simple solution for tracking your thoughts, feelings and moods. Animated emoticons can help identify your mood.       Relax Melodies Designed to help with sleep, on this app you can mix sounds and meditations for relaxation.      Smiling Mind Smiling Mind is meditation made easy: it's a simple tool that helps put  a smile on your mind.        Stop, Breathe & Think  A friendly, simple guide for people through meditations for mindfulness and compassion.  Stop, Breathe and Think Kids Enter your current feelings and choose a "mission" to help you cope. Offers videos for certain moods instead of just sound recordings.       Team Orange The goal of this tool is to help teens change how they think, act, and react. This app helps you focus on your own good feelings and experiences.      The [facility] [facility] The [facility] [facility] ([facility]) contains simple tools to help patients with coping, relaxation, distraction, and positive thinking.

## 2020-05-19 ENCOUNTER — Ambulatory Visit: Payer: Medicaid Other | Admitting: Obstetrics & Gynecology

## 2020-05-19 NOTE — BH Specialist Note (Deleted)
Integrated Behavioral Health via Telemedicine Visit  05/19/2020 Stacey Cook [id number]  Number of [place] visits: 2 Session Start time: 9:45***  Session End time: 10:15*** Total time: {IBH Total Time:21014050}  Referring Provider: *** Patient/Family location: Home*** [facility] Provider location: Center for [place] [facility]  All persons participating in visit: Patient *** and Stacey Cook ***  Types of Service: {CHL AMB TYPE OF SERVICE:[phone]}  I connected with Stacey Cook and/or Stacey Cook's {family members:20773} by Telephone  (Video is Tree surgeon) and verified that I am speaking with the correct person using two identifiers.Discussed confidentiality: {YES/NO:21197}  I discussed the limitations of telemedicine and the availability of in person appointments.  Discussed there is a possibility of technology failure and discussed alternative modes of communication if that failure occurs.  I discussed that engaging in this telemedicine visit, they consent to the provision of behavioral healthcare and the services will be billed under their insurance.  Patient and/or legal guardian expressed understanding and consented to Telemedicine visit: {YES/NO:21197}  Presenting Concerns: Patient and/or family reports the following symptoms/concerns: *** Duration of problem: ***; Severity of problem: {Mild/Moderate/Severe:20260}  Patient and/or Family's Strengths/Protective Factors: {CHL AMB BH PROTECTIVE FACTORS:[phone]}  Goals Addressed: Patient will: 1.  Reduce symptoms of: {IBH Symptoms:21014056}  2.  Increase knowledge and/or ability of: {IBH Patient Tools:21014057}  3.  Demonstrate ability to: {IBH Goals:21014053}  Progress towards Goals: {CHL AMB BH PROGRESS TOWARDS GOALS:[phone]}  Interventions: Interventions utilized:  {IBH Interventions:21014054} Standardized Assessments completed: {IBH  Screening Tools:21014051}  Patient and/or Family Response: ***  Assessment: Patient currently experiencing ***.   Patient may benefit from ***.  Plan: 1. Follow up with behavioral health clinician on : *** 2. Behavioral recommendations: *** 3. Referral(s): {IBH Referrals:21014055}  I discussed the assessment and treatment plan with the patient and/or parent/guardian. They were provided an opportunity to ask questions and all were answered. They agreed with the plan and demonstrated an understanding of the instructions.   They were advised to call back or seek an in-person evaluation if the symptoms worsen or if the condition fails to improve as anticipated.  Stacey Hamman Fariha Goto, LCSW

## 2020-05-27 NOTE — BH Specialist Note (Unsigned)
Integrated Behavioral Health via Telemedicine Visit  05/27/2020 EATHEL PAJAK [id number]  Number of [place] visits: 2 Session Start time: 3:18  Session End time: 3:46 Total time: 28  Referring Provider: Wells Guiles, CNM Patient/Family location: Home [facility] Provider location: [facility] [place] [facility]  All persons participating in visit: [name] and [facility]    Types of Service: Telephone visit  I connected with [name] and/or [employer] n/a by Telephone  (Video is Tree surgeon) and verified that I am speaking with the correct person using two identifiers.Discussed confidentiality: Yes   I discussed the limitations of telemedicine and the availability of in person appointments.  Discussed there is a possibility of technology failure and discussed alternative modes of communication if that failure occurs.  I discussed that engaging in this telemedicine visit, they consent to the provision of behavioral healthcare and the services will be billed under their insurance.  Patient and/or legal guardian expressed understanding and consented to Telemedicine visit: Yes   Presenting Concerns: Patient and/or family reports the following symptoms/concerns: Pt states her primary concern today is stress (including financial/food insecurity) of entire family sick with covid; plans to call [facility] tomorrow to schedule initial appointment for [facility] medication management and ongoing therapy.  Duration of problem: Ongoing; Severity of problem: moderately severe  Patient and/or Family's Strengths/Protective Factors: Social connections and Sense of purpose  Goals Addressed: Patient will: 1.  Reduce symptoms of: anxiety, depression, mood instability and stress   Progress towards Goals: Ongoing  Interventions: Interventions utilized:  Solution-Focused Strategies Standardized Assessments completed:  Not Needed  Patient and/or Family Response: Pt agrees to treatment plan  Assessment: Patient currently experiencing Grief, along with  Bipolar affective disorder, ADHD, Borderline personality disorder (as previously diagnosed via psychiatry).   Patient may benefit from continued psychoeducation and brief therapeutic interventions regarding coping with symptoms of anxiety, depression, and life stress .  Plan: 1. Follow up with behavioral health clinician on : Two weeks 2. Behavioral recommendations:  -Agree to referral to [place] taking Celexa as prescribed -Continue with plan to contact [facility] tomorrow to set up initial appointment -Continue using self-coping strategies daily 3. Referral(s): [facility] (In Clinic) and [employer] Resources:  Food  I discussed the assessment and treatment plan with the patient and/or parent/guardian. They were provided an opportunity to ask questions and all were answered. They agreed with the plan and demonstrated an understanding of the instructions.   They were advised to call back or seek an in-person evaluation if the symptoms worsen or if the condition fails to improve as anticipated.  Caroleen Hamman McMannes, LCSW

## 2020-05-28 ENCOUNTER — Other Ambulatory Visit: Payer: Self-pay

## 2020-05-28 ENCOUNTER — Ambulatory Visit (INDEPENDENT_AMBULATORY_CARE_PROVIDER_SITE_OTHER): Payer: Medicaid Other | Admitting: Clinical

## 2020-05-28 ENCOUNTER — Other Ambulatory Visit (INDEPENDENT_AMBULATORY_CARE_PROVIDER_SITE_OTHER): Payer: Self-pay | Admitting: Internal Medicine

## 2020-05-28 DIAGNOSIS — F3160 Bipolar disorder, current episode mixed, unspecified: Secondary | ICD-10-CM

## 2020-05-28 DIAGNOSIS — F4321 Adjustment disorder with depressed mood: Secondary | ICD-10-CM

## 2020-05-28 DIAGNOSIS — Z8659 Personal history of other mental and behavioral disorders: Secondary | ICD-10-CM

## 2020-05-28 DIAGNOSIS — F603 Borderline personality disorder: Secondary | ICD-10-CM

## 2020-05-28 NOTE — BH Specialist Note (Addendum)
error 

## 2020-06-02 NOTE — BH Specialist Note (Signed)
Pt requests to reschedule virtual visit, due to sickness.

## 2020-06-03 ENCOUNTER — Other Ambulatory Visit: Payer: Self-pay

## 2020-06-03 ENCOUNTER — Emergency Department (HOSPITAL_COMMUNITY)
Admission: EM | Admit: 2020-06-03 | Discharge: 2020-06-03 | Disposition: A | Discharge status: Home / Self Care | Payer: Medicaid Other | Attending: Emergency Medicine | Admitting: Emergency Medicine

## 2020-06-03 ENCOUNTER — Encounter (HOSPITAL_COMMUNITY): Payer: Self-pay | Admitting: Emergency Medicine

## 2020-06-03 ENCOUNTER — Emergency Department (HOSPITAL_COMMUNITY)
Admission: EM | Admit: 2020-06-03 | Discharge: 2020-06-04 | Disposition: A | Discharge status: Home / Self Care | Payer: Medicaid Other | Source: Home / Self Care

## 2020-06-03 ENCOUNTER — Encounter (HOSPITAL_COMMUNITY): Payer: Self-pay

## 2020-06-03 DIAGNOSIS — Z5321 Procedure and treatment not carried out due to patient leaving prior to being seen by health care provider: Secondary | ICD-10-CM | POA: Insufficient documentation

## 2020-06-03 DIAGNOSIS — R109 Unspecified abdominal pain: Secondary | ICD-10-CM | POA: Insufficient documentation

## 2020-06-03 DIAGNOSIS — R197 Diarrhea, unspecified: Secondary | ICD-10-CM | POA: Insufficient documentation

## 2020-06-03 DIAGNOSIS — R1030 Lower abdominal pain, unspecified: Secondary | ICD-10-CM | POA: Insufficient documentation

## 2020-06-03 DIAGNOSIS — Z20822 Contact with and (suspected) exposure to covid-19: Secondary | ICD-10-CM | POA: Insufficient documentation

## 2020-06-03 DIAGNOSIS — R112 Nausea with vomiting, unspecified: Secondary | ICD-10-CM | POA: Insufficient documentation

## 2020-06-03 DIAGNOSIS — R110 Nausea: Secondary | ICD-10-CM | POA: Diagnosis not present

## 2020-06-03 LAB — COMPREHENSIVE METABOLIC PANEL
ALT: 23 U/L (ref 0–44)
AST: 19 U/L (ref 15–41)
Albumin: 3.9 g/dL (ref 3.5–5.0)
Alkaline Phosphatase: 99 U/L (ref 38–126)
Anion gap: 8 (ref 5–15)
BUN: 9 mg/dL (ref 6–20)
CO2: 20 mmol/L — ABNORMAL LOW (ref 22–32)
Calcium: 9.2 mg/dL (ref 8.9–10.3)
Chloride: 108 mmol/L (ref 98–111)
Creatinine, Ser: 0.59 mg/dL (ref 0.44–1.00)
GFR, Estimated: >60 mL/min (ref >60)
Glucose, Bld: 86 mg/dL (ref 70–99)
Potassium: 4 mmol/L (ref 3.5–5.1)
Sodium: 136 mmol/L (ref 135–145)
Total Bilirubin: 0.5 mg/dL (ref 0.3–1.2)
Total Protein: 7.4 g/dL (ref 6.5–8.1)

## 2020-06-03 LAB — CBC
HCT: 41 % (ref 36.0–46.0)
HCT: 40 % (ref 36.0–46.0)
Hemoglobin: 13.4 g/dL (ref 12.0–15.0)
Hemoglobin: 12.9 g/dL (ref 12.0–15.0)
MCH: 27.7 pg (ref 26.0–34.0)
MCH: 27.3 pg (ref 26.0–34.0)
MCHC: 32.7 g/dL (ref 30.0–36.0)
MCHC: 32.3 g/dL (ref 30.0–36.0)
MCV: 84.9 fL (ref 80.0–100.0)
MCV: 84.6 fL (ref 80.0–100.0)
Platelets: 216 10*3/uL (ref 150–400)
Platelets: 223 10*3/uL (ref 150–400)
RBC: 4.83 MIL/uL (ref 3.87–5.11)
RBC: 4.73 MIL/uL (ref 3.87–5.11)
RDW: 14.7 % (ref 11.5–15.5)
RDW: 14.4 % (ref 11.5–15.5)
WBC: 9.1 10*3/uL (ref 4.0–10.5)
WBC: 8.5 10*3/uL (ref 4.0–10.5)
nRBC: 0 % (ref 0.0–0.2)
nRBC: 0 % (ref 0.0–0.2)

## 2020-06-03 LAB — BASIC METABOLIC PANEL
Anion gap: 9 (ref 5–15)
BUN: 11 mg/dL (ref 6–20)
CO2: 21 mmol/L — ABNORMAL LOW (ref 22–32)
Calcium: 9.1 mg/dL (ref 8.9–10.3)
Chloride: 108 mmol/L (ref 98–111)
Creatinine, Ser: 0.62 mg/dL (ref 0.44–1.00)
GFR, Estimated: >60 mL/min (ref >60)
Glucose, Bld: 118 mg/dL — ABNORMAL HIGH (ref 70–99)
Potassium: 3.6 mmol/L (ref 3.5–5.1)
Sodium: 138 mmol/L (ref 135–145)

## 2020-06-03 LAB — LIPASE, BLOOD: Lipase: 28 U/L (ref 11–51)

## 2020-06-03 NOTE — ED Triage Notes (Signed)
Pt reports mid lower abdominal pain that now radiates to right side for several weeks. Pt reports nausea

## 2020-06-03 NOTE — ED Triage Notes (Signed)
Pt returns to the ED with the same complaint as before, right flank pain with N/V/D.  Pt states she has had a covid exposure and tested positive on January 4th.

## 2020-06-06 ENCOUNTER — Ambulatory Visit: Payer: Medicaid Other | Admitting: Obstetrics & Gynecology

## 2020-06-10 ENCOUNTER — Ambulatory Visit: Payer: Medicaid Other | Admitting: Obstetrics & Gynecology

## 2020-06-12 ENCOUNTER — Ambulatory Visit: Payer: Medicaid Other | Admitting: Clinical

## 2020-06-17 NOTE — BH Specialist Note (Signed)
Pt did not arrive to video visit and did not answer the phone; Unable to leave voice message; left MyChart message for patient.   

## 2020-06-24 ENCOUNTER — Ambulatory Visit: Payer: Medicaid Other | Admitting: Clinical

## 2020-06-24 DIAGNOSIS — Z91199 Patient's noncompliance with other medical treatment and regimen due to unspecified reason: Secondary | ICD-10-CM

## 2020-06-24 DIAGNOSIS — Z5329 Procedure and treatment not carried out because of patient's decision for other reasons: Secondary | ICD-10-CM

## 2020-11-04 ENCOUNTER — Encounter (HOSPITAL_COMMUNITY): Payer: Self-pay | Admitting: *Deleted

## 2020-11-04 ENCOUNTER — Emergency Department (HOSPITAL_COMMUNITY): Payer: Medicaid Other

## 2020-11-04 ENCOUNTER — Emergency Department (HOSPITAL_COMMUNITY)
Admission: EM | Admit: 2020-11-04 | Discharge: 2020-11-04 | Disposition: A | Discharge status: Home / Self Care | Payer: Medicaid Other | Attending: Emergency Medicine | Admitting: Emergency Medicine

## 2020-11-04 ENCOUNTER — Other Ambulatory Visit: Payer: Self-pay

## 2020-11-04 DIAGNOSIS — R102 Pelvic and perineal pain: Secondary | ICD-10-CM | POA: Diagnosis not present

## 2020-11-04 DIAGNOSIS — R112 Nausea with vomiting, unspecified: Secondary | ICD-10-CM

## 2020-11-04 DIAGNOSIS — N939 Abnormal uterine and vaginal bleeding, unspecified: Secondary | ICD-10-CM

## 2020-11-04 DIAGNOSIS — Z79899 Other long term (current) drug therapy: Secondary | ICD-10-CM | POA: Diagnosis not present

## 2020-11-04 DIAGNOSIS — F17210 Nicotine dependence, cigarettes, uncomplicated: Secondary | ICD-10-CM | POA: Diagnosis not present

## 2020-11-04 DIAGNOSIS — N760 Acute vaginitis: Secondary | ICD-10-CM | POA: Diagnosis not present

## 2020-11-04 DIAGNOSIS — R1084 Generalized abdominal pain: Secondary | ICD-10-CM | POA: Insufficient documentation

## 2020-11-04 DIAGNOSIS — B9689 Other specified bacterial agents as the cause of diseases classified elsewhere: Secondary | ICD-10-CM | POA: Diagnosis not present

## 2020-11-04 DIAGNOSIS — J4520 Mild intermittent asthma, uncomplicated: Secondary | ICD-10-CM | POA: Insufficient documentation

## 2020-11-04 DIAGNOSIS — R197 Diarrhea, unspecified: Secondary | ICD-10-CM | POA: Diagnosis not present

## 2020-11-04 LAB — CBC WITH DIFFERENTIAL/PLATELET
Abs Immature Granulocytes: 0.02 10*3/uL (ref 0.00–0.07)
Basophils Absolute: 0.1 10*3/uL (ref 0.0–0.1)
Basophils Relative: 1 %
Eosinophils Absolute: 0.4 10*3/uL (ref 0.0–0.5)
Eosinophils Relative: 4 %
HCT: 44.4 % (ref 36.0–46.0)
Hemoglobin: 15 g/dL (ref 12.0–15.0)
Immature Granulocytes: 0 %
Lymphocytes Relative: 24 %
Lymphs Abs: 2.2 10*3/uL (ref 0.7–4.0)
MCH: 29.9 pg (ref 26.0–34.0)
MCHC: 33.8 g/dL (ref 30.0–36.0)
MCV: 88.4 fL (ref 80.0–100.0)
Monocytes Absolute: 0.7 10*3/uL (ref 0.1–1.0)
Monocytes Relative: 7 %
Neutro Abs: 5.9 10*3/uL (ref 1.7–7.7)
Neutrophils Relative %: 64 %
Platelets: 223 10*3/uL (ref 150–400)
RBC: 5.02 MIL/uL (ref 3.87–5.11)
RDW: 13.1 % (ref 11.5–15.5)
WBC: 9.3 10*3/uL (ref 4.0–10.5)
nRBC: 0 % (ref 0.0–0.2)

## 2020-11-04 LAB — URINALYSIS, ROUTINE W REFLEX MICROSCOPIC
Bacteria, UA: NONE SEEN
Bilirubin Urine: NEGATIVE
Glucose, UA: NEGATIVE mg/dL
Ketones, ur: NEGATIVE mg/dL
Leukocytes,Ua: NEGATIVE
Nitrite: NEGATIVE
Protein, ur: NEGATIVE mg/dL
Specific Gravity, Urine: 1.021 (ref 1.005–1.030)
pH: 5 (ref 5.0–8.0)

## 2020-11-04 LAB — COMPREHENSIVE METABOLIC PANEL
ALT: 24 U/L (ref 0–44)
AST: 20 U/L (ref 15–41)
Albumin: 4.1 g/dL (ref 3.5–5.0)
Alkaline Phosphatase: 116 U/L (ref 38–126)
Anion gap: 8 (ref 5–15)
BUN: 12 mg/dL (ref 6–20)
CO2: 25 mmol/L (ref 22–32)
Calcium: 9.5 mg/dL (ref 8.9–10.3)
Chloride: 104 mmol/L (ref 98–111)
Creatinine, Ser: 0.6 mg/dL (ref 0.44–1.00)
GFR, Estimated: >60 mL/min (ref >60)
Glucose, Bld: 102 mg/dL — ABNORMAL HIGH (ref 70–99)
Potassium: 4.1 mmol/L (ref 3.5–5.1)
Sodium: 137 mmol/L (ref 135–145)
Total Bilirubin: 0.4 mg/dL (ref 0.3–1.2)
Total Protein: 7.4 g/dL (ref 6.5–8.1)

## 2020-11-04 LAB — I-STAT BETA HCG BLOOD, ED (MC, WL, AP ONLY): I-stat hCG, quantitative: <5 m[IU]/mL (ref <5)

## 2020-11-04 LAB — WET PREP, GENITAL
Sperm: NONE SEEN
Trich, Wet Prep: NONE SEEN
Yeast Wet Prep HPF POC: NONE SEEN

## 2020-11-04 LAB — LIPASE, BLOOD: Lipase: 36 U/L (ref 11–51)

## 2020-11-04 MED ORDER — MORPHINE SULFATE (PF) 4 MG/ML IV SOLN
4.0000 mg | Freq: Once | INTRAVENOUS | Status: AC
  Administered 2020-11-04: 4 mg via INTRAVENOUS
  Filled 2020-11-04: qty 1

## 2020-11-04 MED ORDER — MORPHINE SULFATE (PF) 2 MG/ML IV SOLN
2.0000 mg | Freq: Once | INTRAVENOUS | Status: AC
  Administered 2020-11-04: 15:00:00 2 mg via INTRAVENOUS
  Filled 2020-11-04: qty 1

## 2020-11-04 MED ORDER — SODIUM CHLORIDE 0.9 % IV SOLN
12.5000 mg | Freq: Four times a day (QID) | INTRAVENOUS | Status: DC | PRN
  Administered 2020-11-04: 16:00:00 12.5 mg via INTRAVENOUS
  Filled 2020-11-04: qty 0.5

## 2020-11-04 MED ORDER — IOHEXOL 300 MG/ML  SOLN
100.0000 mL | Freq: Once | INTRAMUSCULAR | Status: AC | PRN
  Administered 2020-11-04: 100 mL via INTRAVENOUS

## 2020-11-04 MED ORDER — PROMETHAZINE HCL 25 MG PO TABS: 25.0000 mg | ORAL_TABLET | Freq: Four times a day (QID) | ORAL | 0 refills | Status: DC | PRN

## 2020-11-04 MED ORDER — METRONIDAZOLE 500 MG PO TABS: 500.0000 mg | ORAL_TABLET | Freq: Two times a day (BID) | ORAL | 0 refills | Status: DC

## 2020-11-04 MED ORDER — SODIUM CHLORIDE 0.9 % IV BOLUS
1000.0000 mL | Freq: Once | INTRAVENOUS | Status: AC
  Administered 2020-11-04: 15:00:00 1000 mL via INTRAVENOUS

## 2020-11-04 NOTE — ED Provider Notes (Signed)
[facility] EMERGENCY DEPARTMENT Provider Note   CSN: [id number] Arrival date & time: 11/04/20  1409     History Chief Complaint  Patient presents with   Abdominal Pain    Stacey Cook is a 25 y.o. female with a past medical history of ADHD, Obesity, pancreatitis, who presents today for evaluation of abdominal pain, and vaginal bleeding.  She states that her symptoms started yesterday.  She states that she woke up in her left leg yesterday.  She states that she was changing about a pad an hour.  She shows me a picture of a small bean sized red piece that was on the tissue paper that she questions if it was a miscarriage.  She states that she had a negative pregnancy test 2 weeks ago.  She was not sexually active and 1 to 2 days prior to the bleeding starting.  She felt like it tapered off throughout the day however again today she woke up and was.  She does not take any blood thinning medications.  She states that she has constant pain that waxes and wanes.  She has also been nauseous and vomiting.  She has tried Tylenol and ibuprofen and vomited both of those back up.  She denies dysuria, frequency or urgency.  The pain is across both sides in her lower abdomen.  She denies any abnormal vaginal discharge before.  Her Lmp was normal for her about 3 weeks ago, she is normally regular with her cycles.  Her LMP was normal for her.   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   HSV infection    Low iron    Mental disorder    PONV (postoperative nausea and  vomiting)    Suicidal intent    UTI (lower urinary tract infection) 05/2014   Vomiting     Patient Active Problem List   Diagnosis Date Noted   Cellulitis of scalp 04/02/2019   Depression, postpartum 02/09/2019   Epidermal cyst 02/09/2019   Status post cesarean section 01/03/2019   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 with body mass index (BMI) of 37.0 to 37.9 in adult    Gastroesophageal reflux disease    Pancreatitis 01/12/2018   History of shoulder dystocia in prior pregnancy 12/02/2017   History of postpartum hemorrhage 12/02/2017   History of cholestasis during pregnancy 09/21/2017   History of gestational diabetes 08/24/2017   Chest pain due to GERD 08/05/2017   Depression with anxiety and suicidal ideations 06/13/2017   History of miscarriage 01/18/2017   Conductive hearing loss, unilat, unrestrict hearing contralateral side 12/10/2015   Binge-eating disorder, in partial remission, moderate 03/21/2015   Asthma, mild intermittent 02/18/2015   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 12/20/2014   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) 06/20/2014   Perforation of left tympanic membrane  06/19/2014   ADHD (attention deficit hyperactivity disorder), combined type 08/01/2012   ODD (oppositional defiant disorder) 08/01/2012   Vasovagal syncope 02/28/2012   Cholesteatoma of attic of right ear 02/15/2011    Past Surgical History:  Procedure Laterality Date   CESAREAN SECTION N/A 01/03/2019   Procedure: CESAREAN SECTION;  Surgeon: [name], MD;  Location: [facility];  Service: Obstetrics;  Laterality: N/A;   CHOLECYSTECTOMY N/A 01/16/2018   Procedure: LAPAROSCOPIC CHOLECYSTECTOMY;  Surgeon: Aviva Signs, MD;  Location: [facility];  Service: General;  Laterality: N/A;  per Dr. Arnoldo Morale, pt will most likely go home and he will tell her to arrive at 10:45 - already has labs   [place] N/A 09/11/2016   Procedure: [place];  Surgeon: Jonnie Kind, MD;  Location: [facility];  Service: Gynecology;  Laterality: N/A;   IMPLANTATION BONE ANCHORED HEARING AID Right 04/2013   INNER EAR SURGERY Right 03/31/2018   MIDDLE EAR SURGERY     28 surgeries for cholesteatoma   TYMPANOPLASTY Left    TYMPANOSTOMY       OB History     Gravida  5   Para  3   Term  3   Preterm  0   AB  2   Living  3      SAB  2   IAB  0   Ectopic  0   Multiple  0   Live Births  3           Family History  Problem Relation Age of Onset   Cholelithiasis Mother    Kidney disease Mother        stones   Depression Mother    Hypertension Maternal Grandmother    Diabetes Maternal Grandmother    Stroke Maternal Grandmother    Seizures Maternal Grandmother    Asthma Maternal Grandmother    Hyperlipidemia Maternal Grandmother    Thyroid disease Maternal Grandmother    Cancer Maternal Grandmother    Asthma Brother    Seizures Maternal Uncle    Cancer Other        breast- great aunt   Celiac disease Neg Hx     Social History   Tobacco Use   Smoking status: Every Day    Packs/day: 0.50    Years: 1.00    Pack years: 0.50    Types: Cigarettes    Last attempt to quit: 01/27/2020    Years since quitting: 0.7   Smokeless tobacco: Never   Tobacco comments:    "3-4 cigs a day "  Vaping Use   Vaping Use: Former  Substance Use Topics   Alcohol use: Yes    Alcohol/week: 0.0 standard drinks   Drug use: No    Home Medications Prior to Admission medications   Medication Sig Start Date End Date Taking? Authorizing Provider  metroNIDAZOLE (FLAGYL) 500 MG tablet Take 1 tablet (500 mg total) by mouth 2 (two) times daily. 11/04/20  Yes Lorin Glass, PA-C  promethazine (PHENERGAN) 25 MG tablet Take 1 tablet (25 mg total) by mouth every 6 (six) hours as needed for nausea or vomiting. 11/04/20  Yes Lorin Glass, PA-C   acetaminophen (TYLENOL) 325 MG tablet Take 2 tablets (650 mg total) by mouth every 6 (six) hours as needed for moderate pain. 02/14/20   Weems, Delcie Roch, MD  albuterol (PROVENTIL) (2.5 MG/3ML)  0.083% nebulizer solution SMARTSIG:1 Vial(s) Via Nebulizer Every 6-8 Hours PRN 01/29/20   [provider]  citalopram (CELEXA) 20 MG tablet Take 10mg  (1/2 tablet) daily x 1 week, then 20mg  (1 tablet) daily thereafter 03/19/20   Roma Schanz, CNM  diclofenac Sodium (VOLTAREN) 1 % GEL Apply 2 g topically 4 (four) times daily. 04/28/20   Corena Herter, PA-C  etonogestrel-ethinyl estradiol (NUVARING) 0.12-0.015 MG/24HR vaginal ring Insert vaginally and leave in place for 3 consecutive weeks, then remove for 1 week. 03/19/20   Roma Schanz, CNM    Allergies    Dilaudid [hydromorphone], Keflex [cephalexin], Other, Zofran [ondansetron], and Adhesive [tape]  Review of Systems   Review of Systems  Constitutional:  Negative for chills and fever.  Gastrointestinal:  Positive for abdominal pain, nausea and vomiting.  Genitourinary:  Positive for menstrual problem, pelvic pain and vaginal bleeding. Negative for vaginal pain.  Musculoskeletal:  Negative for back pain and neck pain.  All other systems reviewed and are negative.  Physical Exam Updated Vital Signs BP (!) 98/59   Pulse 67   Temp 97.8 F (36.6 C) (Oral)   Resp 16   Ht 4\' 11"  (1.499 m)   Wt 81.6 kg   LMP 10/13/2020   SpO2 100%   BMI 36.36 kg/m   Physical Exam Vitals and nursing note reviewed. Exam conducted with a chaperone present (female ED rn).  Constitutional:      Appearance: She is not diaphoretic.     Comments: Dry heaving intermittently during exam.   HENT:     Head: Atraumatic.  Eyes:     Conjunctiva/sclera: Conjunctivae normal.  Cardiovascular:     Rate and Rhythm: Normal rate.     Heart sounds: Normal heart sounds.  Pulmonary:     Effort: Pulmonary effort is normal. No respiratory distress.   Abdominal:     General: There is no distension.     Palpations: Abdomen is soft.     Tenderness: There is abdominal tenderness in the right lower quadrant, suprapubic area, left upper quadrant and left lower quadrant.  Genitourinary:    Vagina: Bleeding present.     Cervix: No cervical motion tenderness or discharge.     Uterus: Normal.      Adnexa:        Right: No mass or tenderness.         Left: Tenderness present. No mass.    Musculoskeletal:     Cervical back: Normal range of motion and neck supple.     Comments: No obvious acute injury  Skin:    General: Skin is warm.  Neurological:     Mental Status: She is alert.     Comments: Awake and alert, answers all questions appropriately.  Speech is not slurred.  Psychiatric:        Mood and Affect: Mood normal.        Behavior: Behavior normal.    ED Results / Procedures / Treatments   Labs (all labs ordered are listed, but only abnormal results are displayed) Labs Reviewed  WET PREP, GENITAL - Abnormal; Notable for the following components:      Result Value   Clue Cells Wet Prep HPF POC PRESENT (*)    WBC, Wet Prep HPF POC MODERATE (*)    All other components within normal limits  URINALYSIS, ROUTINE W REFLEX MICROSCOPIC - Abnormal; Notable for the following components:   APPearance HAZY (*)    Hgb urine dipstick LARGE (*)  All other components within normal limits  COMPREHENSIVE METABOLIC PANEL - Abnormal; Notable for the following components:   Glucose, Bld 102 (*)    All other components within normal limits  LIPASE, BLOOD  CBC WITH DIFFERENTIAL/PLATELET  I-STAT BETA HCG BLOOD, ED ([facility], [facility], [facility] ONLY)  GC/CHLAMYDIA PROBE AMP ([place]) NOT AT [facility]    EKG None  Radiology CT Abdomen Pelvis W Contrast  Result Date: 11/04/2020 CLINICAL DATA:  Left lower quadrant pain. EXAM: CT ABDOMEN AND PELVIS WITH CONTRAST TECHNIQUE: Multidetector CT imaging of the abdomen and pelvis was performed using the standard  protocol following bolus administration of intravenous contrast. CONTRAST:  166mL OMNIPAQUE IOHEXOL 300 MG/ML  SOLN COMPARISON:  June 13, 2020 FINDINGS: Lower chest: No acute abnormality. Hepatobiliary: No focal liver abnormality is seen. Status post cholecystectomy. No biliary dilatation. Pancreas: Unremarkable. No pancreatic ductal dilatation or surrounding inflammatory changes. Spleen: Normal in size without focal abnormality. Adrenals/Urinary Tract: Adrenal glands are unremarkable. Kidneys are normal, without renal calculi, focal lesion, or hydronephrosis. Bladder is unremarkable. Stomach/Bowel: Stomach is within normal limits. Appendix appears normal. No evidence of bowel wall thickening, distention, or inflammatory changes. Vascular/Lymphatic: No significant vascular findings are present. No enlarged abdominal or pelvic lymph nodes. Reproductive: Uterus and bilateral adnexa are unremarkable. Other: No abdominal wall hernia or abnormality. No abdominopelvic ascites. Musculoskeletal: No acute or significant osseous findings. IMPRESSION: 1. Evidence of prior cholecystectomy. 2. No acute or active process within the abdomen or pelvis. Electronically Signed   By: Virgina Norfolk M.D.   On: 11/04/2020 16:34   US PELVIC COMPLETE W TRANSVAGINAL AND TORSION R/O  Result Date: 11/04/2020 CLINICAL DATA:  Pelvic pain and vaginal bleeding EXAM: TRANSABDOMINAL AND TRANSVAGINAL ULTRASOUND OF PELVIS DOPPLER ULTRASOUND OF OVARIES TECHNIQUE: Both transabdominal and transvaginal ultrasound examinations of the pelvis were performed. Transabdominal technique was performed for global imaging of the pelvis including uterus, ovaries, adnexal regions, and pelvic cul-de-sac. It was necessary to proceed with endovaginal exam following the transabdominal exam to visualize the endometrium, ovaries and adnexa. Color and duplex Doppler ultrasound was utilized to evaluate blood flow to the ovaries. COMPARISON:  Prior obstetrical  ultrasound 02/12/2020, CT 11/04/2020 FINDINGS: Uterus Measurements: 9.1 x 3.7 x 5.2 cm = volume: 91.7 mL. Anteverted maternal uterus. No concerning uterine mass or discernible fibroid. Endometrium Thickness: 10.2 mm, within normal limits. No focal abnormality visualized. Right ovary Measurements: 3.9 x 2.1 x 1.9 cm. = volume: 6.1 mL. Normal anechoic follicles. No concerning adnexal mass. Left ovary Measurements: 3.2 x 1.6 x 2.5 cm = volume: 6.5 cm mL. Normal anechoic follicles. No concerning adnexal mass. Pulsed Doppler evaluation of both ovaries demonstrates normal low-resistance arterial and venous waveforms. Other findings No abnormal free fluid. IMPRESSION: Unremarkable pelvic ultrasound. No evidence of ovarian torsion or concerning adnexal mass. Electronically Signed   By: Lovena Le M.D.   On: 11/04/2020 18:30    Procedures Procedures   Medications Ordered in ED Medications  sodium chloride 0.9 % bolus 1,000 mL (0 mLs Intravenous Stopped 11/04/20 1500)  morphine 2 MG/ML injection 2 mg (2 mg Intravenous Given 11/04/20 1459)  morphine 4 MG/ML injection 4 mg (4 mg Intravenous Given 11/04/20 1636)  iohexol (OMNIPAQUE) 300 MG/ML solution 100 mL (100 mLs Intravenous Contrast Given 11/04/20 1607)    ED Course  I have reviewed the triage vital signs and the nursing notes.  Pertinent labs & imaging results that were available during my care of the patient were reviewed by me and considered in my  medical decision making (see chart for details).  Clinical Course as of 11/05/20 0025  Tue Nov 04, 2020  1833 US PELVIC COMPLETE W TRANSVAGINAL AND TORSION R/O Normal [EH]  1841 Patient is reevaluated, she is resting comfortably.  We discussed her BV and plan to treat.  Additionally recommended outpatient OB/GYN follow-up.  She states that Zofran makes her sick, and requested prescription for Phenergan.  I gave her the option of Phenergan pills or suppository and she elected for pills. [EH]    Clinical  Course User Index [EH] Lorin Glass, PA-C   MDM Rules/Calculators/A&P                         CARTHA ROTERT is a 25 year old woman who presents today for evaluation of nausea, vomiting, diarrhea and abdominal pain with vaginal bleeding. Pregnancy test is negative.  Pain and nausea were treated in the emergency room with IV fluids, IV morphine and IV Phenergan, after which she felt much better.  CT abdomen pelvis was obtained without acute abnormalities.  She does have specific left adnexal tenderness on exam.  Given this with her bleeding a ultrasound was obtained to rule out torsion.  No torsion viewed on ultrasound.   Her wet prep showed BV.  He is given prescription for Flagyl with instructions not to drink.  She is given a prescription for Zofran for nausea as she reports Zofran just makes her vomit.  At this time there does not appear to be a acute life threatening cause of her symptoms.  Recommended outpatient follow-up.  Return precautions were discussed with patient who states their understanding.  At the time of discharge patient denied any unaddressed complaints or concerns.  Patient is agreeable for discharge home.  Note: Portions of this report may have been transcribed using voice recognition software. Every effort was made to ensure accuracy; however, inadvertent computerized transcription errors may be present Clinical Impression(s) / ED Diagnoses Final diagnoses:  Generalized abdominal pain  Non-intractable vomiting with nausea, unspecified vomiting type  Abnormal uterine bleeding  BV (bacterial vaginosis)    Rx / DC Orders ED Discharge Orders          Ordered    metroNIDAZOLE (FLAGYL) 500 MG tablet  2 times daily        11/04/20 1840    promethazine (PHENERGAN) 25 MG tablet  Every 6 hours PRN        11/04/20 1840             Ollen Gross 11/05/20 0026    Milton Ferguson, MD 11/07/20 1030

## 2020-11-04 NOTE — ED Triage Notes (Signed)
Abdominal pain with vaginal bleeding

## 2020-11-04 NOTE — ED Notes (Signed)
Noted sent to pharmacy for Phenergan drip.

## 2020-11-04 NOTE — ED Notes (Signed)
Patient transported to Ultrasound 

## 2020-11-04 NOTE — Discharge Instructions (Addendum)
Today you received medications that may make you sleepy or impair your ability to make decisions.  For the next 24 hours please do not drive, operate heavy machinery, care for a small child with out another adult present, or perform any activities that may cause harm to you or someone else if you were to fall asleep or be impaired.   You are being prescribed a medication which may make you sleepy (phenergan). Please follow up of listed precautions for at least 24 hours after taking one dose.  Today your diagnosed with bacterial vaginosis and received a prescription for metronidazole also known as Flagyl. It is very important that you do not consume any alcohol while taking this medication as it will cause you to become violently ill.  You may have diarrhea from the antibiotics.  It is very important that you continue to take the antibiotics even if you get diarrhea unless a medical professional tells you that you may stop taking them.  If you stop too early the bacteria you are being treated for will become stronger and you may need different, more powerful antibiotics that have more side effects and worsening diarrhea.  Please stay well hydrated and consider probiotics as they may decrease the severity of your diarrhea.  Please be aware that if you take any hormonal contraception (birth control pills, nexplanon, the ring, etc) that your birth control will not work while you are taking antibiotics and you need to use back up protection as directed on the birth control medication information insert.    Please take Ibuprofen (Advil, motrin) and Tylenol (acetaminophen) to relieve your pain.    You may take up to 600 MG (3 pills) of normal strength ibuprofen every 8 hours as needed.   You make take tylenol, up to 1,000 mg (two extra strength pills) every 8 hours as needed.   It is safe to take ibuprofen and tylenol at the same time as they work differently.   Do not take more than 3,000 mg tylenol in a 24  hour period (not more than one dose every 8 hours.  Please check all medication labels as many medications such as pain and cold medications may contain tylenol.  Do not drink alcohol while taking these medications.  Do not take other NSAID'S while taking ibuprofen (such as aleve or naproxen).  Please take ibuprofen with food to decrease stomach upset.

## 2020-11-04 NOTE — ED Notes (Signed)
Pt given heat pack.

## 2020-11-05 LAB — GC/CHLAMYDIA PROBE AMP (~~LOC~~) NOT AT ARMC
Chlamydia: NEGATIVE
Comment: NEGATIVE
Comment: NORMAL
Neisseria Gonorrhea: NEGATIVE

## 2020-12-29 ENCOUNTER — Emergency Department (HOSPITAL_COMMUNITY): Payer: Medicaid Other

## 2020-12-29 ENCOUNTER — Emergency Department (HOSPITAL_COMMUNITY)
Admission: EM | Admit: 2020-12-29 | Discharge: 2020-12-29 | Disposition: A | Discharge status: Home / Self Care | Payer: Medicaid Other | Attending: Emergency Medicine | Admitting: Emergency Medicine

## 2020-12-29 ENCOUNTER — Encounter (HOSPITAL_COMMUNITY): Payer: Self-pay | Admitting: *Deleted

## 2020-12-29 ENCOUNTER — Other Ambulatory Visit: Payer: Self-pay

## 2020-12-29 DIAGNOSIS — Z79899 Other long term (current) drug therapy: Secondary | ICD-10-CM | POA: Diagnosis not present

## 2020-12-29 DIAGNOSIS — W01198A Fall on same level from slipping, tripping and stumbling with subsequent striking against other object, initial encounter: Secondary | ICD-10-CM | POA: Diagnosis not present

## 2020-12-29 DIAGNOSIS — Y92003 Bedroom of unspecified non-institutional (private) residence as the place of occurrence of the external cause: Secondary | ICD-10-CM | POA: Insufficient documentation

## 2020-12-29 DIAGNOSIS — M542 Cervicalgia: Secondary | ICD-10-CM

## 2020-12-29 DIAGNOSIS — J4520 Mild intermittent asthma, uncomplicated: Secondary | ICD-10-CM | POA: Insufficient documentation

## 2020-12-29 DIAGNOSIS — S0990XA Unspecified injury of head, initial encounter: Secondary | ICD-10-CM

## 2020-12-29 DIAGNOSIS — F17210 Nicotine dependence, cigarettes, uncomplicated: Secondary | ICD-10-CM | POA: Insufficient documentation

## 2020-12-29 MED ORDER — PROMETHAZINE HCL 12.5 MG PO TABS: 12.5000 mg | ORAL_TABLET | Freq: Four times a day (QID) | ORAL | 0 refills | Status: DC | PRN

## 2020-12-29 MED ORDER — PROMETHAZINE HCL 12.5 MG PO TABS
12.5000 mg | ORAL_TABLET | Freq: Once | ORAL | Status: AC
  Administered 2020-12-29: 12.5 mg via ORAL
  Filled 2020-12-29: qty 1

## 2020-12-29 MED ORDER — FENTANYL CITRATE PF 50 MCG/ML IJ SOSY
50.0000 ug | PREFILLED_SYRINGE | Freq: Once | INTRAMUSCULAR | Status: AC
  Administered 2020-12-29: 50 ug via INTRAMUSCULAR
  Filled 2020-12-29: qty 1

## 2020-12-29 NOTE — ED Notes (Signed)
Pt was brought into [facility] for vitals and I told her that we did not have a room ready yet and she would have to go back into the waiting room and pt began to "feel dizzy" and stated she was about to pass out.

## 2020-12-29 NOTE — ED Triage Notes (Signed)
Slipped in some water at home an fell against the sink and hit head

## 2020-12-29 NOTE — ED Notes (Signed)
Patient transported to CT 

## 2020-12-29 NOTE — Discharge Instructions (Addendum)
Suspect you are suffering from a concussion.  Recommend over-the-counter pain medications as needed.  Also recommend decrease mental stimulation, i.e. reducing screen time, mentally stimulating activities and slowly reintroduce them as tolerated.  Also recommend decreasing exercising  can also provoke headaches.  I would reintroduce them as tolerated.  Please follow-up with your PCP and/or the concussion clinic for further evaluation.  Come back to the emergency department if you develop chest pain, shortness of breath, severe abdominal pain, uncontrolled nausea, vomiting, diarrhea.

## 2020-12-29 NOTE — ED Provider Notes (Signed)
[facility] EMERGENCY DEPARTMENT Provider Note   CSN: [id number] Arrival date & time: 12/29/20  1137     History Chief Complaint  Patient presents with   Head Injury    Stacey Cook is a 25 y.o. female.  HPI  Patient with significant medical history of anxiety, headaches, presents with chief complaint of a head injury.  Patient states earlier today she was in the bathroom slipped on some water causing her to fall. Patient states she struck the right side of her head on the sink.  She denies losing conscious, is not on anticoagulant.  She states since the incident she had a severe headache, the headache has gotten worse, she has photophobia, increased sensitivity to noise, admits to slight blurry vision, but denies paresthesias or weakness in the upper/ lower extremities.  She also notes that she is having some slight neck pain, pain is in the middle of her neck, does not radiate.  She is able to turn her head  She does not endorse chest pain, back pain, abdominal pain, nausea, vomiting, diarrhea.  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   HSV infection    Low iron    Mental disorder    PONV (postoperative nausea and vomiting)    Suicidal intent    UTI (lower urinary tract infection) 05/2014   Vomiting     Patient Active Problem List   Diagnosis Date Noted   Cellulitis of scalp 04/02/2019   Depression, postpartum 02/09/2019   Epidermal cyst 02/09/2019   Status post cesarean section 01/03/2019   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 with body mass index (BMI) of 37.0 to 37.9 in adult    Gastroesophageal reflux disease    Pancreatitis 01/12/2018   History of shoulder dystocia in prior pregnancy 12/02/2017   History of postpartum hemorrhage 12/02/2017   History of cholestasis during pregnancy 09/21/2017   History of gestational diabetes 08/24/2017   Chest pain due to GERD 08/05/2017   Depression with anxiety and suicidal ideations 06/13/2017   History of miscarriage 01/18/2017   Conductive hearing loss, unilat, unrestrict hearing contralateral side 12/10/2015   Binge-eating disorder, in partial remission, moderate 03/21/2015   Asthma, mild intermittent 02/18/2015   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 12/20/2014   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) 06/20/2014   Perforation of left tympanic membrane 06/19/2014   ADHD (attention deficit hyperactivity disorder), combined type 08/01/2012   ODD (oppositional defiant disorder) 08/01/2012   Vasovagal syncope 02/28/2012   Cholesteatoma of attic of right ear 02/15/2011    Past Surgical History:  Procedure Laterality Date   CESAREAN SECTION N/A 01/03/2019   Procedure: CESAREAN SECTION;  Surgeon: [name], MD;  Location: [facility];  Service: Obstetrics;  Laterality: N/A;   CHOLECYSTECTOMY N/A 01/16/2018   Procedure: LAPAROSCOPIC CHOLECYSTECTOMY;  Surgeon: Aviva Signs,  MD;  Location: [facility];  Service: General;  Laterality: N/A;  per Dr. Arnoldo Morale, pt will most likely go home and he will tell her to arrive at 10:45 - already has labs   [place] N/A 09/11/2016   Procedure: [place];  Surgeon: Jonnie Kind, MD;  Location: [facility];  Service: Gynecology;  Laterality: N/A;   IMPLANTATION BONE ANCHORED HEARING AID Right 04/2013   INNER EAR SURGERY Right 03/31/2018   MIDDLE EAR SURGERY     28 surgeries for cholesteatoma    TYMPANOPLASTY Left    TYMPANOSTOMY       OB History     Gravida  5   Para  3   Term  3   Preterm  0   AB  2   Living  3      SAB  2   IAB  0   Ectopic  0   Multiple  0   Live Births  3           Family History  Problem Relation Age of Onset   Cholelithiasis Mother    Kidney disease Mother        stones   Depression Mother    Hypertension Maternal Grandmother    Diabetes Maternal Grandmother    Stroke Maternal Grandmother    Seizures Maternal Grandmother    Asthma Maternal Grandmother    Hyperlipidemia Maternal Grandmother    Thyroid disease Maternal Grandmother    Cancer Maternal Grandmother    Asthma Brother    Seizures Maternal Uncle    Cancer Other        breast- great aunt   Celiac disease Neg Hx     Social History   Tobacco Use   Smoking status: Every Day    Packs/day: 0.50    Years: 1.00    Pack years: 0.50    Types: Cigarettes    Last attempt to quit: 01/27/2020    Years since quitting: 0.9   Smokeless tobacco: Never   Tobacco comments:    "3-4 cigs a day "  Vaping Use   Vaping Use: Former  Substance Use Topics   Alcohol use: Yes    Alcohol/week: 0.0 standard drinks   Drug use: No    Home Medications Prior to Admission medications   Medication Sig Start Date End Date Taking? Authorizing Provider  promethazine (PHENERGAN) 12.5 MG tablet Take 1 tablet (12.5 mg total) by mouth every 6 (six) hours as needed for up to 5 days for nausea or vomiting. 12/29/20 01/03/21 Yes Marcello Fennel, PA-C  acetaminophen (TYLENOL) 325 MG tablet Take 2 tablets (650 mg total) by mouth every 6 (six) hours as needed for moderate pain. 02/14/20   Tilden Dome, MD  albuterol (PROVENTIL) (2.5 MG/3ML) 0.083% nebulizer solution SMARTSIG:1 Vial(s) Via Nebulizer Every 6-8 Hours PRN 01/29/20   [provider]  citalopram (CELEXA) 20 MG tablet Take '10mg'$  (1/2 tablet) daily x 1 week, then '20mg'$  (1 tablet) daily thereafter 03/19/20   Roma Schanz,  CNM  diclofenac Sodium (VOLTAREN) 1 % GEL Apply 2 g topically 4 (four) times daily. 04/28/20   Corena Herter, PA-C  etonogestrel-ethinyl estradiol (NUVARING) 0.12-0.015 MG/24HR vaginal ring Insert vaginally and leave in place for 3 consecutive weeks, then remove for 1 week. 03/19/20   Roma Schanz, CNM  metroNIDAZOLE (FLAGYL) 500 MG tablet Take 1  tablet (500 mg total) by mouth 2 (two) times daily. 11/04/20   Lorin Glass, PA-C  promethazine (PHENERGAN) 25 MG tablet Take 1 tablet (25 mg total) by mouth every 6 (six) hours as needed for nausea or vomiting. 11/04/20   Lorin Glass, PA-C    Allergies    Dilaudid [hydromorphone], Keflex [cephalexin], Other, Zofran [ondansetron], and Adhesive [tape]  Review of Systems   Review of Systems  Constitutional:  Negative for chills and fever.  HENT:  Negative for congestion.   Respiratory:  Negative for shortness of breath.   Cardiovascular:  Negative for chest pain.  Gastrointestinal:  Positive for nausea. Negative for abdominal pain and vomiting.  Genitourinary:  Negative for enuresis.  Musculoskeletal:  Positive for neck pain. Negative for back pain.  Skin:  Negative for rash.  Neurological:  Positive for headaches. Negative for dizziness.  Hematological:  Does not bruise/bleed easily.   Physical Exam Updated Vital Signs BP (!) 104/50 (BP Location: Left Arm)   Pulse 71   Temp 98.7 F (37.1 C) (Oral)   Resp 19   LMP 12/15/2020 (Exact Date)   SpO2 100%   Physical Exam Vitals and nursing note reviewed.  Constitutional:      General: She is not in acute distress.    Appearance: She is not ill-appearing.  HENT:     Head: Normocephalic and atraumatic.     Comments: No gross deformities of the head, she has tenderness on the right parietal lobe.    Nose: No congestion.  Eyes:     Extraocular Movements: Extraocular movements intact.     Conjunctiva/sclera: Conjunctivae normal.     Pupils: Pupils are equal, round, and  reactive to light.  Cardiovascular:     Rate and Rhythm: Normal rate and regular rhythm.     Pulses: Normal pulses.     Heart sounds: No murmur heard.   No friction rub. No gallop.  Pulmonary:     Effort: No respiratory distress.     Breath sounds: No wheezing, rhonchi or rales.  Musculoskeletal:     Cervical back: Tenderness present. No rigidity.     Comments: Spine was palpated she has slight tenderness to palpation on her cervical spine no gross forms present, she has full range of motion 5 of 5 strength in the upper and lower extremities.  Skin:    General: Skin is warm and dry.  Neurological:     Mental Status: She is alert.     Comments: No facial asymmetry, no difficulty with word finding, able to follow two-step commands, coordination fully intact, in the upper lower extremities, no unilateral weakness present.  Psychiatric:        Mood and Affect: Mood normal.    ED Results / Procedures / Treatments   Labs (all labs ordered are listed, but only abnormal results are displayed) Labs Reviewed - No data to display  EKG None  Radiology CT Head Wo Contrast  Result Date: 12/29/2020 CLINICAL DATA:  Head trauma, repeat vomiting (Age 23-64y); Neck trauma, midline tenderness (Age 46-64y) EXAM: CT HEAD WITHOUT CONTRAST CT CERVICAL SPINE WITHOUT CONTRAST TECHNIQUE: Multidetector CT imaging of the head and cervical spine was performed following the standard protocol without intravenous contrast. Multiplanar CT image reconstructions of the cervical spine were also generated. COMPARISON:  11/15/2013, 04/07/2020 FINDINGS: CT HEAD FINDINGS Brain: No evidence of acute infarction, hemorrhage, hydrocephalus, extra-axial collection or mass lesion/mass effect. Vascular: No hyperdense vessel or unexpected calcification. Skull: Normal. Negative for fracture  or focal lesion. Sinuses/Orbits: Prior right mastoidectomy with opacification of the right middle ear, unchanged. Left mastoid air cells are  clear. Paranasal sinuses and orbital structures are within normal limits. Other: Negative for scalp hematoma. CT CERVICAL SPINE FINDINGS Alignment: Facet joints are aligned without dislocation or traumatic listhesis. Dens and lateral masses are aligned. Straightening with slight reversal of the cervical lordosis. Skull base and vertebrae: No acute fracture. No primary bone lesion or focal pathologic process. Soft tissues and spinal canal: No prevertebral fluid or swelling. No visible canal hematoma. Disc levels:  Unremarkable. Upper chest: Included lung apices are clear. Other: None. IMPRESSION: 1. No evidence of acute intracranial process. 2. No evidence of acute fracture or traumatic listhesis of the cervical spine. Electronically Signed   By: Davina Poke D.O.   On: 12/29/2020 17:02   CT Cervical Spine Wo Contrast  Result Date: 12/29/2020 CLINICAL DATA:  Head trauma, repeat vomiting (Age 61-64y); Neck trauma, midline tenderness (Age 63-64y) EXAM: CT HEAD WITHOUT CONTRAST CT CERVICAL SPINE WITHOUT CONTRAST TECHNIQUE: Multidetector CT imaging of the head and cervical spine was performed following the standard protocol without intravenous contrast. Multiplanar CT image reconstructions of the cervical spine were also generated. COMPARISON:  11/15/2013, 04/07/2020 FINDINGS: CT HEAD FINDINGS Brain: No evidence of acute infarction, hemorrhage, hydrocephalus, extra-axial collection or mass lesion/mass effect. Vascular: No hyperdense vessel or unexpected calcification. Skull: Normal. Negative for fracture or focal lesion. Sinuses/Orbits: Prior right mastoidectomy with opacification of the right middle ear, unchanged. Left mastoid air cells are clear. Paranasal sinuses and orbital structures are within normal limits. Other: Negative for scalp hematoma. CT CERVICAL SPINE FINDINGS Alignment: Facet joints are aligned without dislocation or traumatic listhesis. Dens and lateral masses are aligned. Straightening with  slight reversal of the cervical lordosis. Skull base and vertebrae: No acute fracture. No primary bone lesion or focal pathologic process. Soft tissues and spinal canal: No prevertebral fluid or swelling. No visible canal hematoma. Disc levels:  Unremarkable. Upper chest: Included lung apices are clear. Other: None. IMPRESSION: 1. No evidence of acute intracranial process. 2. No evidence of acute fracture or traumatic listhesis of the cervical spine. Electronically Signed   By: Davina Poke D.O.   On: 12/29/2020 17:02    Procedures Procedures   Medications Ordered in ED Medications  promethazine (PHENERGAN) tablet 12.5 mg (12.5 mg Oral Given 12/29/20 1649)  fentaNYL (SUBLIMAZE) injection 50 mcg (50 mcg Intramuscular Given 12/29/20 1651)    ED Course  I have reviewed the triage vital signs and the nursing notes.  Pertinent labs & imaging results that were available during my care of the patient were reviewed by me and considered in my medical decision making (see chart for details).    MDM Rules/Calculators/A&P                          Initial impression-patient presents after mechanical fall.  She is alert, does not appear acute stress, vital signs reassuring.  Will obtain CT imaging of head and neck for reevaluation.  Work-up-CT head and neck both negative for acute findings.  Rule out- low suspicion for intracranial head bleed as patient denies loss of conscious, is not on anticoagulant, she does not endorse  paresthesia/weakness in the upper and lower extremities, no focal deficits present on my exam CT head negative for acute findings.  Low suspicion for spinal cord abnormality or spinal fracture spine no deformities present in the back, patient has full range of  motion in the upper and lower extremities CT C-spine negative for acute findings.  Low suspicion for pneumothorax as lung sounds are clear bilaterally, chest is nontender to palpation, will defer imaging at this time.  Low  suspicion for intra-abdominal trauma as abdomen soft nontender to palpation.    Plan-  Headache-likely postconcussion, will recommend over-the-counter pain medications, decrease mental stimulation, follow-up with PCP and/or concussion clinic for further evaluation. Neck pain-likely muscular will recommend over-the-counter pain medications, follow-up PCP for further evaluation.  Vital signs have remained stable, no indication for hospital admission.   Patient given at home care as well strict return precautions.  Patient verbalized that they understood agreed to said plan.  Final Clinical Impression(s) / ED Diagnoses Final diagnoses:  Injury of head, initial encounter  Neck pain    Rx / DC Orders ED Discharge Orders          Ordered    promethazine (PHENERGAN) 12.5 MG tablet  Every 6 hours PRN        12/29/20 1747             Marcello Fennel, PA-C 12/29/20 1749    Truddie Hidden, MD 12/29/20 [phone]

## 2021-01-17 ENCOUNTER — Other Ambulatory Visit: Payer: Self-pay

## 2021-01-17 ENCOUNTER — Emergency Department (HOSPITAL_COMMUNITY): Payer: Medicaid Other

## 2021-01-17 ENCOUNTER — Encounter (HOSPITAL_COMMUNITY): Payer: Self-pay

## 2021-01-17 ENCOUNTER — Emergency Department (HOSPITAL_COMMUNITY)
Admission: EM | Admit: 2021-01-17 | Discharge: 2021-01-17 | Disposition: A | Discharge status: Home / Self Care | Payer: Medicaid Other | Attending: Emergency Medicine | Admitting: Emergency Medicine

## 2021-01-17 DIAGNOSIS — J4520 Mild intermittent asthma, uncomplicated: Secondary | ICD-10-CM | POA: Insufficient documentation

## 2021-01-17 DIAGNOSIS — R202 Paresthesia of skin: Secondary | ICD-10-CM | POA: Insufficient documentation

## 2021-01-17 DIAGNOSIS — S4991XA Unspecified injury of right shoulder and upper arm, initial encounter: Secondary | ICD-10-CM | POA: Diagnosis present

## 2021-01-17 DIAGNOSIS — M546 Pain in thoracic spine: Secondary | ICD-10-CM | POA: Diagnosis not present

## 2021-01-17 DIAGNOSIS — W010XXA Fall on same level from slipping, tripping and stumbling without subsequent striking against object, initial encounter: Secondary | ICD-10-CM | POA: Diagnosis not present

## 2021-01-17 DIAGNOSIS — M25521 Pain in right elbow: Secondary | ICD-10-CM | POA: Diagnosis not present

## 2021-01-17 DIAGNOSIS — M79601 Pain in right arm: Secondary | ICD-10-CM | POA: Diagnosis not present

## 2021-01-17 DIAGNOSIS — F17210 Nicotine dependence, cigarettes, uncomplicated: Secondary | ICD-10-CM | POA: Insufficient documentation

## 2021-01-17 MED ORDER — KETOROLAC TROMETHAMINE 15 MG/ML IJ SOLN
15.0000 mg | Freq: Once | INTRAMUSCULAR | Status: DC
  Filled 2021-01-17: qty 1

## 2021-01-17 MED ORDER — ACETAMINOPHEN 325 MG PO TABS
650.0000 mg | ORAL_TABLET | Freq: Once | ORAL | Status: AC
  Administered 2021-01-17: 650 mg via ORAL
  Filled 2021-01-17: qty 2

## 2021-01-17 MED ORDER — KETOROLAC TROMETHAMINE 15 MG/ML IJ SOLN
15.0000 mg | Freq: Once | INTRAMUSCULAR | Status: AC
  Administered 2021-01-17: 15 mg via INTRAMUSCULAR

## 2021-01-17 MED ORDER — MELOXICAM 7.5 MG PO TABS: 7.5000 mg | ORAL_TABLET | Freq: Every day | ORAL | 0 refills | Status: DC

## 2021-01-17 NOTE — ED Provider Notes (Signed)
[facility] EMERGENCY DEPARTMENT Provider Note   CSN: [id number] Arrival date & time: 01/17/21  1110     History Chief Complaint  Patient presents with   Arm Pain    Stacey Cook is a 25 y.o. female with PMH significant for recent shoulder injury at work that she said they thought might be a rotator cuff tear, who presents today with new fall and right shoulder injury. Patient reports she tripped over a chair and fell hard onto her right shoulder, and elbow. She now presents with significant pain, and decreased range of movement of her shoulder and elbow, as well as some pain radiating into the back. She reports some tingling without numbness in her fingers and hand. She denies pain in her head, denies LOC.    Arm Pain      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   HSV infection    Low iron    Mental disorder    PONV (postoperative nausea and vomiting)    Suicidal intent    UTI (lower urinary tract infection) 05/2014   Vomiting     Patient Active Problem List   Diagnosis Date Noted   Cellulitis of scalp 04/02/2019   Depression, postpartum 02/09/2019   Epidermal cyst 02/09/2019   Status post cesarean section 01/03/2019   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 with body mass index (BMI) of 37.0 to 37.9 in adult    Gastroesophageal reflux disease    Pancreatitis 01/12/2018   History of shoulder dystocia in prior pregnancy 12/02/2017   History of postpartum hemorrhage 12/02/2017   History of cholestasis during pregnancy  09/21/2017   History of gestational diabetes 08/24/2017   Chest pain due to GERD 08/05/2017   Depression with anxiety and suicidal ideations 06/13/2017   History of miscarriage 01/18/2017   Conductive hearing loss, unilat, unrestrict hearing contralateral side 12/10/2015   Binge-eating disorder, in partial remission, moderate 03/21/2015   Asthma, mild intermittent 02/18/2015   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 12/20/2014   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) 06/20/2014   Perforation of left tympanic membrane 06/19/2014   ADHD (attention deficit hyperactivity disorder), combined type 08/01/2012   ODD (oppositional defiant disorder) 08/01/2012   Vasovagal syncope 02/28/2012   Cholesteatoma of attic of right ear 02/15/2011    Past Surgical History:  Procedure Laterality Date   CESAREAN SECTION N/A 01/03/2019   Procedure: CESAREAN SECTION;  Surgeon: [name], MD;  Location: [facility];  Service: Obstetrics;  Laterality: N/A;   CHOLECYSTECTOMY N/A 01/16/2018   Procedure: LAPAROSCOPIC CHOLECYSTECTOMY;  Surgeon: Aviva Signs, MD;  Location: [facility];  Service: General;  Laterality: N/A;  per Dr. Arnoldo Morale, pt will most likely go home and he will tell her to arrive at 10:45 - already has labs   [place]  IMPLANT Right    DILATION AND EVACUATION N/A 09/11/2016   Procedure: DILATATION AND CURRATAGE  2ND TRIMESTER;  Surgeon: Jonnie Kind, MD;  Location: [place] [facility];  Service: Gynecology;  Laterality: N/A;   IMPLANTATION BONE ANCHORED HEARING AID Right 04/2013   INNER EAR SURGERY Right 03/31/2018   MIDDLE EAR SURGERY     28 surgeries for cholesteatoma   TYMPANOPLASTY Left    TYMPANOSTOMY       OB History     Gravida  5   Para  3   Term  3   Preterm  0   AB  2   Living  3      SAB  2   IAB  0   Ectopic  0   Multiple  0   Live Births  3           Family History  Problem Relation Age of Onset    Cholelithiasis Mother    Kidney disease Mother        stones   Depression Mother    Hypertension Maternal Grandmother    Diabetes Maternal Grandmother    Stroke Maternal Grandmother    Seizures Maternal Grandmother    Asthma Maternal Grandmother    Hyperlipidemia Maternal Grandmother    Thyroid disease Maternal Grandmother    Cancer Maternal Grandmother    Asthma Brother    Seizures Maternal Uncle    Cancer Other        breast- great aunt   Celiac disease Neg Hx     Social History   Tobacco Use   Smoking status: Every Day    Packs/day: 0.50    Years: 1.00    Pack years: 0.50    Types: Cigarettes    Last attempt to quit: 01/27/2020    Years since quitting: 0.9   Smokeless tobacco: Never   Tobacco comments:    "3-4 cigs a day "  Vaping Use   Vaping Use: Former  Substance Use Topics   Alcohol use: Yes    Comment: occ   Drug use: No    Home Medications Prior to Admission medications   Medication Sig Start Date End Date Taking? Authorizing Provider  meloxicam (MOBIC) 7.5 MG tablet Take 1 tablet (7.5 mg total) by mouth daily. 01/17/21  Yes Wilborn Membreno H, PA-C  acetaminophen (TYLENOL) 325 MG tablet Take 2 tablets (650 mg total) by mouth every 6 (six) hours as needed for moderate pain. 02/14/20   Tilden Dome, MD  albuterol (PROVENTIL) (2.5 MG/3ML) 0.083% nebulizer solution SMARTSIG:1 Vial(s) Via Nebulizer Every 6-8 Hours PRN 01/29/20   [provider]  citalopram (CELEXA) 20 MG tablet Take '10mg'$  (1/2 tablet) daily x 1 week, then '20mg'$  (1 tablet) daily thereafter 03/19/20   Roma Schanz, CNM  diclofenac Sodium (VOLTAREN) 1 % GEL Apply 2 g topically 4 (four) times daily. 04/28/20   Corena Herter, PA-C  etonogestrel-ethinyl estradiol (NUVARING) 0.12-0.015 MG/24HR vaginal ring Insert vaginally and leave in place for 3 consecutive weeks, then remove for 1 week. 03/19/20   Roma Schanz, CNM  metroNIDAZOLE (FLAGYL) 500 MG tablet Take 1 tablet (500 mg  total) by mouth 2 (two) times daily. 11/04/20   Lorin Glass, PA-C  promethazine (PHENERGAN) 12.5 MG tablet Take 1 tablet (12.5 mg total) by mouth every 6 (six) hours as needed for up to 5 days for nausea or vomiting. 12/29/20 01/03/21  Marcello Fennel, PA-C  promethazine (PHENERGAN) 25 MG tablet  Take 1 tablet (25 mg total) by mouth every 6 (six) hours as needed for nausea or vomiting. 11/04/20   Lorin Glass, PA-C    Allergies    Dilaudid [hydromorphone], Keflex [cephalexin], Other, Zofran [ondansetron], and Adhesive [tape]  Review of Systems   Review of Systems  Musculoskeletal:        Arm and shoulder pain  Neurological:  Negative for syncope.   Physical Exam Updated Vital Signs BP 116/79 (BP Location: Left Arm)   Pulse 94   Temp 98 F (36.7 C) (Oral)   Resp 18   Ht 4' 11.5" (1.511 m)   Wt 83.1 kg   LMP 12/15/2020   SpO2 98%   BMI 36.39 kg/m   Physical Exam Vitals and nursing note reviewed.  Constitutional:      General: She is not in acute distress.    Appearance: Normal appearance.  HENT:     Head: Normocephalic and atraumatic.  Eyes:     General:        Right eye: No discharge.        Left eye: No discharge.  Cardiovascular:     Rate and Rhythm: Normal rate and regular rhythm.     Comments: Intact radial and ulnar pulses in right arm Pulmonary:     Effort: Pulmonary effort is normal. No respiratory distress.  Musculoskeletal:        General: No deformity.     Comments: Significant tenderness to palpation across right clavicle, humeral head, no tenderness of the shaft of the humerus, tenderness in the right elbow.  Patient has mostly full passive range of motion with some pain, is only able to flex elbow about 50% of active range of motion, can only abduct, and flex shoulder to 33% of active range of motion.  Skin:    General: Skin is warm and dry.     Capillary Refill: Capillary refill takes less than 2 seconds.  Neurological:     Mental  Status: She is alert and oriented to person, place, and time.     Comments: Some tingling without numbness in the hand and fingers that is worse with elbow bending  Psychiatric:        Mood and Affect: Mood normal.        Behavior: Behavior normal.    ED Results / Procedures / Treatments   Labs (all labs ordered are listed, but only abnormal results are displayed) Labs Reviewed - No data to display  EKG None  Radiology DG Clavicle Right  Result Date: 01/17/2021 CLINICAL DATA:  Right shoulder and elbow pain after tripping and falling. EXAM: RIGHT CLAVICLE - 2+ VIEWS COMPARISON:  None. FINDINGS: There is no evidence of fracture or other focal bone lesions. Soft tissues are unremarkable. IMPRESSION: Negative. Electronically Signed   By: Jacqulynn Cadet M.D.   On: 01/17/2021 13:33   DG Shoulder Right  Result Date: 01/17/2021 CLINICAL DATA:  Right shoulder and elbow pain after tripping and falling. EXAM: RIGHT SHOULDER - 2+ VIEW COMPARISON:  None. FINDINGS: There is no evidence of fracture or dislocation. There is no evidence of arthropathy or other focal bone abnormality. Soft tissues are unremarkable. IMPRESSION: Negative. Electronically Signed   By: Jacqulynn Cadet M.D.   On: 01/17/2021 13:31   DG Elbow Complete Right  Result Date: 01/17/2021 CLINICAL DATA:  Right shoulder and elbow pain after tripping and falling. EXAM: RIGHT ELBOW - COMPLETE 3+ VIEW COMPARISON:  None. FINDINGS: There is no evidence of fracture,  dislocation, or joint effusion. There is no evidence of arthropathy or other focal bone abnormality. Soft tissues are unremarkable. IMPRESSION: Negative. Electronically Signed   By: Jacqulynn Cadet M.D.   On: 01/17/2021 13:32    Procedures Procedures   Medications Ordered in ED Medications  acetaminophen (TYLENOL) tablet 650 mg (650 mg Oral Given 01/17/21 1310)  ketorolac (TORADOL) 15 MG/ML injection 15 mg (15 mg Intramuscular Given 01/17/21 1315)    ED Course  I have  reviewed the triage vital signs and the nursing notes.  Pertinent labs & imaging results that were available during my care of the patient were reviewed by me and considered in my medical decision making (see chart for details).    MDM Rules/Calculators/A&P                         I discussed this case with my attending physician who cosigned this note including patient's presenting symptoms, physical exam, and planned diagnostics and interventions. Attending physician stated agreement with plan or made changes to plan which were implemented.   Attending physician assessed patient at bedside.  No evidence of bony abnormality on radiographic imaging.  No evidence of dislocation of right shoulder or right elbow.  Patient in significant pain on examination, however in the setting of existing rotator cuff pathology, favor exacerbation of existing musculoskeletal injury.  No evidence of myositis secondary to intravenous drug use, no evidence of compartment syndrome, neurovascularly intact with minimal tingling in the fingers and hand. Discussed patient should wear a sling, use NSAIDs, and Tylenol for pain relief, and follow-up with orthopedics early next week.  Return precautions given. Final Clinical Impression(s) / ED Diagnoses Final diagnoses:  Right arm pain  Right elbow pain    Rx / DC Orders ED Discharge Orders          Ordered    meloxicam (MOBIC) 7.5 MG tablet  Daily        01/17/21 1408             Karene Bracken, [place], PA-C 01/17/21 1413    Noemi Chapel, MD 01/18/21 [phone]

## 2021-01-17 NOTE — ED Triage Notes (Signed)
Pt says she tripped and fell approx 1 hour ago and c/o pain in r shoulder and r am.  Also c/o back pain.

## 2021-01-17 NOTE — ED Notes (Signed)
Pt provided discharge instructions and prescription information. Pt was given the opportunity to ask questions and questions were answered. Discharge signature not obtained in the setting of the COVID-19 pandemic in order to reduce high touch surfaces.  ° °

## 2021-01-17 NOTE — Discharge Instructions (Signed)
As we discussed there is no evidence of a break, or dislocation on your radiographic imaging. Please use Tylenol or ibuprofen for pain.  You may use 600 mg ibuprofen every 6 hours or 1000 mg of Tylenol every 6 hours.  You may choose to alternate between the 2.  This would be most effective.  Not to exceed 4 g of Tylenol within 24 hours.  Not to exceed 3200 mg ibuprofen 24 hours.  You may use the stronger NSAID that I prescribed, Mobic, in place of ibuprofen.  Please follow-up with orthopedics, please rest the arm, apply ice to painful areas, try to keep elevated.  Follow-up with your pain worsens or fails to improve.  I have attached the contact information for an orthopedic doctor for you to follow-up with.

## 2021-01-19 ENCOUNTER — Telehealth: Payer: Medicaid Other | Admitting: Women's Health

## 2021-01-20 ENCOUNTER — Ambulatory Visit (INDEPENDENT_AMBULATORY_CARE_PROVIDER_SITE_OTHER): Payer: Medicaid Other | Admitting: Adult Health

## 2021-01-20 ENCOUNTER — Other Ambulatory Visit: Payer: Self-pay

## 2021-01-20 ENCOUNTER — Other Ambulatory Visit (HOSPITAL_COMMUNITY)
Admission: RE | Admit: 2021-01-20 | Discharge: 2021-01-20 | Disposition: A | Discharge status: Home / Self Care | Payer: Medicaid Other | Source: Ambulatory Visit | Attending: Women's Health | Admitting: Women's Health

## 2021-01-20 ENCOUNTER — Encounter: Payer: Self-pay | Admitting: Adult Health

## 2021-01-20 VITALS — BP 102/66 | HR 78 | Ht 59.5 in | Wt 186.0 lb

## 2021-01-20 DIAGNOSIS — Z3009 Encounter for other general counseling and advice on contraception: Secondary | ICD-10-CM | POA: Diagnosis not present

## 2021-01-20 DIAGNOSIS — D280 Benign neoplasm of vulva: Secondary | ICD-10-CM | POA: Diagnosis not present

## 2021-01-20 DIAGNOSIS — N898 Other specified noninflammatory disorders of vagina: Secondary | ICD-10-CM | POA: Insufficient documentation

## 2021-01-20 DIAGNOSIS — Z3202 Encounter for pregnancy test, result negative: Secondary | ICD-10-CM

## 2021-01-20 LAB — POCT URINE PREGNANCY: Preg Test, Ur: NEGATIVE

## 2021-01-20 NOTE — Progress Notes (Signed)
  Subjective:     Patient ID: [name], female   DOB: 12-01-1995, 25 y.o.   MRN: [id number]  HPI Tykia is a 25 year old white female, married, [id number], in complaining of purple bumps on vulva and vaginal dryness and wants to get an IUD, had liletta placed after delivery 10/21 and it was expelled, and she then used nuva ring, and removed it 01/12/21, no period yet,last sex was last week.  PCP is [facility].  Lab Results  Component Value Date   DIAGPAP  08/23/2019    - Negative for intraepithelial lesion or malignancy (NILM)    Review of Systems +bumps on vagina +vaginal dryness +hard to reach orgasm No period since stopping nuva ring Reviewed past medical,surgical, social and family history. Reviewed medications and allergies.     Objective:   Physical Exam BP 102/66 (BP Location: Left Arm, Patient Position: Sitting, Cuff Size: Normal)   Pulse 78   Ht 4' 11.5" (1.511 m)   Wt 186 lb (84.4 kg)   LMP 12/11/2020   Breastfeeding No   BMI 36.94 kg/m  UPT is negative. Skin warm and dry.Pelvic: external genitalia is normal in appearance,has angiokeratomas both labia, vagina: white discharge with odor,CV swab obtained,urethra has no lesions or masses noted, cervix:smooth and bulbous, uterus: normal size, shape and contour, non tender, no masses felt, adnexa: no masses or tenderness noted. Bladder is non tender and no masses felt.  Fall risk is high, has sling on right arm fell over a chair     [facility] - 01/20/21 1018       Pregnancy Intention Screening   Does the patient want to become pregnant in the next year? No    Does the patient's partner want to become pregnant in the next year? No    Would the patient like to discuss contraceptive options today? Yes      Contraception Wrap Up   Current Method Vaginal Ring    End Method IUD or IUS    Contraception Counseling Provided Yes            Examination chaperoned by Levy Pupa LPN  Assessment:     1. Pregnancy examination  or test, negative result   2. Angiokeratoma of labia majora Leave alone, showed her pictures in [facility] atlas  3. Vaginal dryness Try replens for moisture and astroglide with sex and increase foreplay, and clitiral stimulation  4. General counseling and advice on contraceptive management Discussed IUD and that is what she wants, liletta like she had   5. Vaginal discharge CV swab sent for GC/CHL,trich,BV and yeast     Plan:     No sex  Return 01/30/21 at 9:10 am for IUD insertion with S. Higin[facility] Plan

## 2021-01-21 LAB — CERVICOVAGINAL ANCILLARY ONLY
Bacterial Vaginitis (gardnerella): POSITIVE — AB
Candida Glabrata: NEGATIVE
Candida Vaginitis: NEGATIVE
Chlamydia: NEGATIVE
Comment: NEGATIVE
Comment: NEGATIVE
Comment: NEGATIVE
Comment: NEGATIVE
Comment: NEGATIVE
Comment: NORMAL
Neisseria Gonorrhea: NEGATIVE
Trichomonas: NEGATIVE

## 2021-01-22 ENCOUNTER — Other Ambulatory Visit: Payer: Self-pay | Admitting: Adult Health

## 2021-01-22 MED ORDER — METRONIDAZOLE 500 MG PO TABS: 500.0000 mg | ORAL_TABLET | Freq: Two times a day (BID) | ORAL | 0 refills | Status: DC

## 2021-01-22 NOTE — Progress Notes (Signed)
+  BV on vaginal swab will rx flagyl 

## 2021-01-30 ENCOUNTER — Ambulatory Visit: Payer: Medicaid Other | Admitting: Family Medicine

## 2021-01-30 ENCOUNTER — Encounter: Payer: Self-pay | Admitting: Family Medicine

## 2021-01-30 ENCOUNTER — Other Ambulatory Visit: Payer: Self-pay

## 2021-01-30 VITALS — BP 110/64 | HR 78 | Ht 59.0 in | Wt 186.4 lb

## 2021-01-30 DIAGNOSIS — Z30430 Encounter for insertion of intrauterine contraceptive device: Secondary | ICD-10-CM | POA: Diagnosis not present

## 2021-01-30 LAB — POCT URINE PREGNANCY: Preg Test, Ur: NEGATIVE

## 2021-01-30 MED ORDER — LEVONORGESTREL 20.1 MCG/DAY IU IUD
1.0000 | INTRAUTERINE_SYSTEM | Freq: Once | INTRAUTERINE | Status: AC
  Administered 2021-01-30: 1 via INTRAUTERINE

## 2021-01-30 NOTE — Progress Notes (Signed)
    GYNECOLOGY OFFICE PROCEDURE NOTE  Stacey Cook is a 25 y.o. [phone] here for [place] IUD insertion. No GYN concerns.  Last pap smear was on 08/2019 and was normal. She was last sexually active prior to 9/5 weekend when Nuvaring was still in place.   IUD Insertion Procedure Note Patient identified, informed consent performed, consent signed.   Discussed risks of irregular bleeding, cramping, infection, malpositioning or misplacement of the IUD outside the uterus which may require further procedure such as laparoscopy. Time out was performed.  Urine pregnancy test negative.  Speculum placed in the vagina.  Cervix visualized.  Cleaned with Betadine x 2.  Grasped anteriorly with a single tooth tenaculum.  Uterus sounded to 9 cm.  Liletta IUD placed per manufacturer's recommendations.  Strings trimmed to 3 cm. Tenaculum was removed, good hemostasis noted.  Patient tolerated procedure well.   Patient was given post-procedure instructions. She was also given ibuprofen 800mg  to take for cramping relief. She was advised to have backup contraception for at least one week.  Patient was also asked to check IUD strings periodically and follow up PRN/or in 4-6 for string check if desired.   Darrelyn Hillock, DO  [facility]  01/30/2021, 9:21 AM

## 2021-03-13 ENCOUNTER — Ambulatory Visit
Admission: EM | Admit: 2021-03-13 | Discharge: 2021-03-13 | Disposition: A | Discharge status: Home / Self Care | Payer: Medicaid Other | Attending: Family Medicine | Admitting: Family Medicine

## 2021-03-13 ENCOUNTER — Encounter: Payer: Self-pay | Admitting: Emergency Medicine

## 2021-03-13 ENCOUNTER — Other Ambulatory Visit: Payer: Self-pay

## 2021-03-13 DIAGNOSIS — Z1152 Encounter for screening for COVID-19: Secondary | ICD-10-CM

## 2021-03-13 DIAGNOSIS — J4521 Mild intermittent asthma with (acute) exacerbation: Secondary | ICD-10-CM

## 2021-03-13 DIAGNOSIS — J069 Acute upper respiratory infection, unspecified: Secondary | ICD-10-CM | POA: Diagnosis not present

## 2021-03-13 MED ORDER — PREDNISONE 20 MG PO TABS: 40.0000 mg | ORAL_TABLET | Freq: Every day | ORAL | 0 refills | Status: DC

## 2021-03-13 MED ORDER — PROMETHAZINE-DM 6.25-15 MG/5ML PO SYRP: 5.0000 mL | ORAL_SOLUTION | Freq: Four times a day (QID) | ORAL | 0 refills | Status: DC | PRN

## 2021-03-13 MED ORDER — ALBUTEROL SULFATE HFA 108 (90 BASE) MCG/ACT IN AERS: 1.0000 | INHALATION_SPRAY | Freq: Four times a day (QID) | RESPIRATORY_TRACT | 0 refills | Status: AC | PRN

## 2021-03-13 NOTE — ED Triage Notes (Signed)
Patient c/o nasal congestion x 7 days.   Patient c/o headache that started last night.   Patient denies fever at home.   Patient has taken Advil cold/sinus with no relief of symptoms.

## 2021-03-13 NOTE — ED Provider Notes (Signed)
[facility]    CSN: [id number] Arrival date & time: 03/13/21  1501      History   Chief Complaint Chief Complaint  Patient presents with   Nasal Congestion   Headache    HPI Stacey Cook is a 25 y.o. female.   Presenting today with 1 week of congestion, chest tightness, wheezing, fatigue, headache. Denies CP, SOB, abdominal pain, N/V/D. Taking advil cold and sinus with minimal relief. Multiple sick contacts in the home. Hx of asthma, allergic rhinitis not currently taking anything for these.    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   HSV infection    Low iron    Mental disorder    PONV (postoperative nausea and vomiting)    Suicidal intent    UTI (lower urinary tract infection) 05/2014   Vomiting     Patient Active Problem List   Diagnosis Date Noted   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   Pregnancy examination or test, negative result 01/20/2021   Vaginal dryness 01/20/2021   General counseling and advice on contraceptive management 01/20/2021   Cellulitis of scalp 04/02/2019   Depression, postpartum 02/09/2019   Epidermal cyst 02/09/2019   Status post cesarean section 01/03/2019   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 with body mass index (BMI) of 37.0 to 37.9 in adult    Gastroesophageal reflux disease    Pancreatitis 01/12/2018   History of shoulder dystocia in prior pregnancy 12/02/2017   History of postpartum hemorrhage 12/02/2017   History of cholestasis during pregnancy 09/21/2017    History of gestational diabetes 08/24/2017   Chest pain due to GERD 08/05/2017   Depression with anxiety and suicidal ideations 06/13/2017   History of miscarriage 01/18/2017   Conductive hearing loss, unilat, unrestrict hearing contralateral side 12/10/2015   Binge-eating disorder, in partial remission, moderate 03/21/2015   Asthma, mild intermittent 02/18/2015   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 12/20/2014   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) 06/20/2014   Perforation of left tympanic membrane 06/19/2014   ADHD (attention deficit hyperactivity disorder), combined type 08/01/2012   ODD (oppositional defiant disorder) 08/01/2012   Vasovagal syncope 02/28/2012   Cholesteatoma of attic of right ear 02/15/2011    Past Surgical History:  Procedure Laterality Date   CESAREAN SECTION N/A 01/03/2019   Procedure: CESAREAN SECTION;  Surgeon: [name], MD;  Location: [facility];  Service: Obstetrics;  Laterality: N/A;   CHOLECYSTECTOMY N/A 01/16/2018   Procedure: LAPAROSCOPIC CHOLECYSTECTOMY;  Surgeon: Aviva Signs, MD;  Location: [facility];  Service: General;  Laterality: N/A;  per Dr. Arnoldo Morale, pt will most likely go home and he will tell her to arrive at 10:45 - already has labs   CHOLESTEATOMA EXCISION     COCHLEAR IMPLANT Right    COCHLEAR IMPLANT REMOVAL Right 05/2020   DILATION AND  EVACUATION N/A 09/11/2016   Procedure: DILATATION AND CURRATAGE  2ND TRIMESTER;  Surgeon: Jonnie Kind, MD;  Location: [facility];  Service: Gynecology;  Laterality: N/A;   IMPLANTATION BONE ANCHORED HEARING AID Right 04/2013   INNER EAR SURGERY Right 03/31/2018   MIDDLE EAR SURGERY     28 surgeries for cholesteatoma   TYMPANOPLASTY Left    TYMPANOSTOMY      OB History     Gravida  5   Para  3   Term  3   Preterm  0   AB  2   Living  3      SAB  2   IAB  0   Ectopic  0   Multiple  0   Live Births  3            Home Medications    Prior  to Admission medications   Medication Sig Start Date End Date Taking? Authorizing Provider  albuterol (VENTOLIN HFA) 108 (90 Base) MCG/ACT inhaler Inhale 1-2 puffs into the lungs every 6 (six) hours as needed for wheezing or shortness of breath. 03/13/21  Yes Volney American, PA-C  predniSONE (DELTASONE) 20 MG tablet Take 2 tablets (40 mg total) by mouth daily with breakfast. 03/13/21  Yes Volney American, PA-C  promethazine-dextromethorphan (PROMETHAZINE-DM) 6.25-15 MG/5ML syrup Take 5 mLs by mouth 4 (four) times daily as needed for cough. 03/13/21  Yes Volney American, PA-C  acetaminophen (TYLENOL) 325 MG tablet Take 2 tablets (650 mg total) by mouth every 6 (six) hours as needed for moderate pain. 02/14/20   Tilden Dome, MD  albuterol (PROVENTIL) (2.5 MG/3ML) 0.083% nebulizer solution SMARTSIG:1 Vial(s) Via Nebulizer Every 6-8 Hours PRN 01/29/20   [provider]  meloxicam (MOBIC) 7.5 MG tablet Take 1 tablet (7.5 mg total) by mouth daily. 01/17/21   Prosperi, Joesph Fillers, PA-C    Family History Family History  Problem Relation Age of Onset   Hypertension Maternal Grandmother    Diabetes Maternal Grandmother    Stroke Maternal Grandmother    Seizures Maternal Grandmother    Asthma Maternal Grandmother    Hyperlipidemia Maternal Grandmother    Thyroid disease Maternal Grandmother    Cancer Maternal Grandmother    Cholelithiasis Mother    Kidney disease Mother        stones   Depression Mother    Asthma Brother    Scoliosis Daughter    Seizures Maternal Uncle    Cancer Other        breast- great aunt   Celiac disease Neg Hx     Social History Social History   Tobacco Use   Smoking status: Every Day    Packs/day: 0.50    Years: 1.00    Pack years: 0.50    Types: Cigarettes    Last attempt to quit: 01/27/2020    Years since quitting: 1.1   Smokeless tobacco: Never   Tobacco comments:    "3-4 cigs a day "  Vaping Use   Vaping Use: Some days   Substance Use Topics   Alcohol use: Yes    Comment: occ   Drug use: No     Allergies   Dilaudid [hydromorphone], Keflex [cephalexin], Other, Zofran [ondansetron], and Adhesive [tape]   Review of Systems Review of Systems PER HPI   Physical Exam Triage Vital Signs ED Triage Vitals [03/13/21 1700]  Enc Vitals Group     BP 115/79     Pulse Rate Marland Kitchen)  115     Resp 16     Temp 97.9 F (36.6 C)     Temp Source Temporal     SpO2 98 %     Weight      Height      Head Circumference      Peak Flow      Pain Score 9     Pain Loc      Pain Edu?      Excl. in [place]?    No data found.  Updated Vital Signs BP 115/79 (BP Location: Right Arm)   Pulse (!) 115   Temp 97.9 F (36.6 C) (Temporal)   Resp 16   LMP  (LMP Unknown)   SpO2 98%   Breastfeeding No   Visual Acuity Right Eye Distance:   Left Eye Distance:   Bilateral Distance:    Right Eye Near:   Left Eye Near:    Bilateral Near:     Physical Exam Vitals and nursing note reviewed.  Constitutional:      Appearance: Normal appearance. She is not ill-appearing.  HENT:     Head: Atraumatic.     Right Ear: Tympanic membrane normal.     Left Ear: Tympanic membrane normal.     Nose: Rhinorrhea present.     Mouth/Throat:     Mouth: Mucous membranes are moist.     Pharynx: Posterior oropharyngeal erythema present. No oropharyngeal exudate.  Eyes:     Extraocular Movements: Extraocular movements intact.     Conjunctiva/sclera: Conjunctivae normal.  Cardiovascular:     Rate and Rhythm: Normal rate and regular rhythm.     Heart sounds: Normal heart sounds.  Pulmonary:     Effort: Pulmonary effort is normal. No respiratory distress.     Breath sounds: Wheezing present. No rales.     Comments: Mild diffuse wheezes b/l Abdominal:     General: Bowel sounds are normal. There is no distension.     Palpations: Abdomen is soft.     Tenderness: There is no abdominal tenderness. There is no guarding.  Musculoskeletal:         General: Normal range of motion.     Cervical back: Normal range of motion and neck supple.  Skin:    General: Skin is warm and dry.  Neurological:     Mental Status: She is alert and oriented to person, place, and time.     Motor: No weakness.     Gait: Gait normal.  Psychiatric:        Mood and Affect: Mood normal.        Thought Content: Thought content normal.        Judgment: Judgment normal.     UC Treatments / Results  Labs (all labs ordered are listed, but only abnormal results are displayed) Labs Reviewed  COVID-19, FLU A+B NAA   Narrative:    Performed at:  [address] [place], [place]  [id number] Lab Director: Rush Farmer MD, Phone:  [phone]    EKG   Radiology No results found.  Procedures Procedures (including critical care time)  Medications Ordered in UC Medications - No data to display  Initial Impression / Assessment and Plan / UC Course  I have reviewed the triage vital signs and the nursing notes.  Pertinent labs & imaging results that were available during my care of the patient were reviewed by me and considered in my medical decision making (see chart for details).  Viral testing pending, suspect largely related to asthma and allergy exacerbation. Discussed antihistamines, prednisone burst, phenergan dm, albuterol prn. Supportive home care and return precautions reviewed.   Final Clinical Impressions(s) / UC Diagnoses   Final diagnoses:  Encounter for screening for COVID-19  Viral URI with cough  Mild intermittent asthma with acute exacerbation   Discharge Instructions   None    ED Prescriptions     Medication Sig Dispense Auth. Provider   predniSONE (DELTASONE) 20 MG tablet Take 2 tablets (40 mg total) by mouth daily with breakfast. 10 tablet Volney American, PA-C   promethazine-dextromethorphan (PROMETHAZINE-DM) 6.25-15 MG/5ML syrup Take 5 mLs by mouth 4 (four) times daily as needed for  cough. 100 mL Volney American, PA-C   albuterol (VENTOLIN HFA) 108 (90 Base) MCG/ACT inhaler Inhale 1-2 puffs into the lungs every 6 (six) hours as needed for wheezing or shortness of breath. 18 g Volney American, [place]      PDMP not reviewed this encounter.   Merrie Roof [place], [place] 03/17/21 [phone]

## 2021-03-14 ENCOUNTER — Ambulatory Visit: Payer: Self-pay

## 2021-03-14 LAB — COVID-19, FLU A+B NAA
Influenza A, NAA: NOT DETECTED
Influenza B, NAA: NOT DETECTED
SARS-CoV-2, NAA: NOT DETECTED

## 2021-04-04 ENCOUNTER — Ambulatory Visit
Admission: EM | Admit: 2021-04-04 | Discharge: 2021-04-04 | Disposition: A | Discharge status: Home / Self Care | Payer: Medicaid Other | Attending: Physician Assistant | Admitting: Physician Assistant

## 2021-04-04 ENCOUNTER — Other Ambulatory Visit: Payer: Self-pay

## 2021-04-04 DIAGNOSIS — J069 Acute upper respiratory infection, unspecified: Secondary | ICD-10-CM | POA: Diagnosis not present

## 2021-04-04 NOTE — Discharge Instructions (Signed)
Your covid and flu test are pending

## 2021-04-04 NOTE — ED Triage Notes (Signed)
Pt states that she has some back pain. X2 days Pt states that she has a cough that makes her chest hurt and feels very fatigued. X2 days  Pt states that she isn't vaccinated for covid.  Pt states that isn't sure if she had flu vaccine

## 2021-04-05 LAB — COVID-19, FLU A+B NAA
Influenza A, NAA: DETECTED — AB
Influenza B, NAA: NOT DETECTED
SARS-CoV-2, NAA: NOT DETECTED

## 2021-04-07 NOTE — ED Provider Notes (Signed)
[facility]    CSN: [id number] Arrival date & time: 04/04/21  1214      History   Chief Complaint Chief Complaint  Patient presents with   Back Pain    Back pain that radiates down right leg. X2 days   Cough    Pt states that she has a cough. Pt states that she has some fatigue. X2 days    HPI Stacey Cook is a 25 y.o. female.   The history is provided by the patient. No language interpreter was used.  Cough Cough characteristics:  Non-productive Sputum characteristics:  Nondescript Severity:  Mild Onset quality:  Sudden Timing:  Constant Progression:  Worsening Chronicity:  New Relieved by:  Nothing Ineffective treatments:  None tried Associated symptoms: no fever    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   HSV infection    Low iron    Mental disorder    PONV (postoperative nausea and vomiting)    Suicidal intent    UTI (lower urinary tract infection) 05/2014   Vomiting     Patient Active Problem List   Diagnosis Date Noted   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   Pregnancy examination or test, negative result 01/20/2021   Vaginal dryness 01/20/2021   General counseling and advice on contraceptive management 01/20/2021   Cellulitis of scalp 04/02/2019   Depression, postpartum 02/09/2019   Epidermal cyst 02/09/2019   Status post cesarean section 01/03/2019   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 with body mass index (BMI) of 37.0 to 37.9 in adult    Gastroesophageal reflux disease     Pancreatitis 01/12/2018   History of shoulder dystocia in prior pregnancy 12/02/2017   History of postpartum hemorrhage 12/02/2017   History of cholestasis during pregnancy 09/21/2017   History of gestational diabetes 08/24/2017   Chest pain due to GERD 08/05/2017   Depression with anxiety and suicidal ideations 06/13/2017   History of miscarriage 01/18/2017   Conductive hearing loss, unilat, unrestrict hearing contralateral side 12/10/2015   Binge-eating disorder, in partial remission, moderate 03/21/2015   Asthma, mild intermittent 02/18/2015   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 12/20/2014   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) 06/20/2014   Perforation of left tympanic membrane 06/19/2014   ADHD (attention deficit hyperactivity disorder), combined type 08/01/2012   ODD (oppositional defiant disorder) 08/01/2012   Vasovagal syncope 02/28/2012   Cholesteatoma of attic of right ear 02/15/2011    Past Surgical History:  Procedure Laterality Date   CESAREAN SECTION N/A 01/03/2019   Procedure: CESAREAN SECTION;  Surgeon: [name], MD;  Location: [facility];  Service: Obstetrics;  Laterality: N/A;   CHOLECYSTECTOMY N/A 01/16/2018   Procedure: LAPAROSCOPIC CHOLECYSTECTOMY;  Surgeon: Aviva Signs, MD;  Location: [facility];  Service: General;  Laterality: N/A;  per Dr. Arnoldo Morale, pt will most  likely go home and he will tell her to arrive at 10:45 - already has labs   CHOLESTEATOMA EXCISION     COCHLEAR IMPLANT Right    COCHLEAR IMPLANT REMOVAL Right 05/2020   DILATION AND EVACUATION N/A 09/11/2016   Procedure: DILATATION AND CURRATAGE  2ND TRIMESTER;  Surgeon: Jonnie Kind, MD;  Location: [facility];  Service: Gynecology;  Laterality: N/A;   IMPLANTATION BONE ANCHORED HEARING AID Right 04/2013   INNER EAR SURGERY Right 03/31/2018   MIDDLE EAR SURGERY     28 surgeries for cholesteatoma   TYMPANOPLASTY Left    TYMPANOSTOMY      OB History     Gravida   5   Para  3   Term  3   Preterm  0   AB  2   Living  3      SAB  2   IAB  0   Ectopic  0   Multiple  0   Live Births  3            Home Medications    Prior to Admission medications   Medication Sig Start Date End Date Taking? Authorizing Provider  acetaminophen (TYLENOL) 325 MG tablet Take 2 tablets (650 mg total) by mouth every 6 (six) hours as needed for moderate pain. 02/14/20   Tilden Dome, MD  albuterol (PROVENTIL) (2.5 MG/3ML) 0.083% nebulizer solution SMARTSIG:1 Vial(s) Via Nebulizer Every 6-8 Hours PRN 01/29/20   [provider]  albuterol (VENTOLIN HFA) 108 (90 Base) MCG/ACT inhaler Inhale 1-2 puffs into the lungs every 6 (six) hours as needed for wheezing or shortness of breath. 03/13/21   Volney American, PA-C  meloxicam (MOBIC) 7.5 MG tablet Take 1 tablet (7.5 mg total) by mouth daily. 01/17/21   Prosperi, Christian H, PA-C  predniSONE (DELTASONE) 20 MG tablet Take 2 tablets (40 mg total) by mouth daily with breakfast. 03/13/21   Volney American, PA-C  promethazine-dextromethorphan (PROMETHAZINE-DM) 6.25-15 MG/5ML syrup Take 5 mLs by mouth 4 (four) times daily as needed for cough. 03/13/21   Volney American, PA-C    Family History Family History  Problem Relation Age of Onset   Hypertension Maternal Grandmother    Diabetes Maternal Grandmother    Stroke Maternal Grandmother    Seizures Maternal Grandmother    Asthma Maternal Grandmother    Hyperlipidemia Maternal Grandmother    Thyroid disease Maternal Grandmother    Cancer Maternal Grandmother    Cholelithiasis Mother    Kidney disease Mother        stones   Depression Mother    Asthma Brother    Scoliosis Daughter    Seizures Maternal Uncle    Cancer Other        breast- great aunt   Celiac disease Neg Hx     Social History Social History   Tobacco Use   Smoking status: Every Day    Packs/day: 0.50    Years: 1.00    Pack years: 0.50    Types:  Cigarettes    Last attempt to quit: 01/27/2020    Years since quitting: 1.1   Smokeless tobacco: Never   Tobacco comments:    "3-4 cigs a day "  Vaping Use   Vaping Use: Some days  Substance Use Topics   Alcohol use: Yes    Comment: occ   Drug use: No     Allergies   Dilaudid [hydromorphone], Keflex [cephalexin], Other, Zofran [ondansetron], and Adhesive [tape]  Review of Systems Review of Systems  Constitutional:  Negative for fever.  Respiratory:  Positive for cough.   All other systems reviewed and are negative.   Physical Exam Triage Vital Signs ED Triage Vitals  Enc Vitals Group     BP 04/04/21 1531 98/66     Pulse Rate 04/04/21 1531 (!) 132     Resp 04/04/21 1531 18     Temp 04/04/21 1531 99.7 F (37.6 C)     Temp Source 04/04/21 1531 Oral     SpO2 04/04/21 1531 95 %     Weight --      Height 04/04/21 1440 4\' 11"  (1.499 m)     Head Circumference --      Peak Flow --      Pain Score 04/04/21 1440 10     Pain Loc --      Pain Edu? --      Excl. in [place]? --    No data found.  Updated Vital Signs BP 98/66 (BP Location: Right Arm)   Pulse (!) 132   Temp 99.7 F (37.6 C) (Oral)   Resp 18   Ht 4\' 11"  (1.499 m)   LMP  (LMP Unknown)   SpO2 95%   BMI 37.65 kg/m   Visual Acuity Right Eye Distance:   Left Eye Distance:   Bilateral Distance:    Right Eye Near:   Left Eye Near:    Bilateral Near:     Physical Exam Vitals reviewed.  HENT:     Nose: Nose normal.     Mouth/Throat:     Mouth: Mucous membranes are moist.  Cardiovascular:     Rate and Rhythm: Normal rate.     Pulses: Normal pulses.  Pulmonary:     Effort: Pulmonary effort is normal.  Abdominal:     General: Abdomen is flat.  Skin:    General: Skin is warm.  Neurological:     General: No focal deficit present.     Mental Status: She is alert.  Psychiatric:        Mood and Affect: Mood normal.     UC Treatments / Results  Labs (all labs ordered are listed, but only abnormal  results are displayed) Labs Reviewed  COVID-19, FLU A+B NAA - Abnormal; Notable for the following components:      Result Value   Influenza A, NAA Detected (*)    All other components within normal limits   Narrative:    Performed at:  [address], [place], [place]  [id number] Lab Director: Rush Farmer MD, Phone:  [phone]    EKG   Radiology No results found.  Procedures Procedures (including critical care time)  Medications Ordered in UC Medications - No data to display  Initial Impression / Assessment and Plan / UC Course  I have reviewed the triage vital signs and the nursing notes.  Pertinent labs & imaging results that were available during my care of the patient were reviewed by me and considered in my medical decision making (see chart for details).     MDM:  covid and influenza pending  Final Clinical Impressions(s) / UC Diagnoses   Final diagnoses:  Viral URI     Discharge Instructions      Your covid and flu test are pending   ED Prescriptions   None    PDMP not reviewed this encounter. An After Visit Summary was printed and given to the patient.  Fransico Mead[place], [place] 04/07/21 [id number]

## 2021-05-01 ENCOUNTER — Other Ambulatory Visit: Payer: Self-pay

## 2021-05-01 ENCOUNTER — Ambulatory Visit
Admission: EM | Admit: 2021-05-01 | Discharge: 2021-05-01 | Disposition: A | Discharge status: Home / Self Care | Payer: Medicaid Other | Attending: Physician Assistant | Admitting: Physician Assistant

## 2021-05-01 DIAGNOSIS — M545 Low back pain, unspecified: Secondary | ICD-10-CM

## 2021-05-01 LAB — POCT URINE PREGNANCY: Preg Test, Ur: NEGATIVE
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
## 2021-05-01 NOTE — Discharge Instructions (Signed)
Return if any problems.
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
## 2021-07-15 ENCOUNTER — Other Ambulatory Visit: Payer: Self-pay

## 2021-07-15 ENCOUNTER — Encounter: Payer: Self-pay | Admitting: Adult Health

## 2021-07-15 ENCOUNTER — Ambulatory Visit: Payer: Medicaid Other | Admitting: Adult Health

## 2021-07-15 VITALS — BP 137/80 | HR 106 | Ht 59.0 in | Wt 191.0 lb

## 2021-07-15 DIAGNOSIS — Z975 Presence of (intrauterine) contraceptive device: Secondary | ICD-10-CM

## 2021-07-15 DIAGNOSIS — R102 Pelvic and perineal pain: Secondary | ICD-10-CM

## 2021-07-15 DIAGNOSIS — N730 Acute parametritis and pelvic cellulitis: Secondary | ICD-10-CM

## 2021-07-15 DIAGNOSIS — N941 Unspecified dyspareunia: Secondary | ICD-10-CM | POA: Diagnosis not present

## 2021-07-15 HISTORY — DX: Acute parametritis and pelvic cellulitis: N73.0

## 2021-07-15 MED ORDER — CEFTRIAXONE SODIUM 500 MG IJ SOLR
500.0000 mg | Freq: Once | INTRAMUSCULAR | Status: AC
  Administered 2021-07-15: 500 mg via INTRAMUSCULAR

## 2021-07-15 MED ORDER — METRONIDAZOLE 500 MG PO TABS: 500.0000 mg | ORAL_TABLET | Freq: Two times a day (BID) | ORAL | 0 refills | Status: DC

## 2021-07-15 MED ORDER — DOXYCYCLINE HYCLATE 100 MG PO TABS: 100.0000 mg | ORAL_TABLET | Freq: Two times a day (BID) | ORAL | 0 refills | Status: DC

## 2021-07-15 NOTE — Progress Notes (Signed)
?Subjective:  ?  ? Patient ID: Stacey Cook, female   DOB: [birth year], 26 y.o.   MRN: [id number] ? ?HPI ?Stacey Cook is a 26 year old white female, married, [id number], in for IUD check has had pain in pelvic area esp left side since December. Pain with sex, and pressure with sitting and laying.  She had Liletta inserted 01/30/21.  ?She is having cochlear implant 07/29/21.  ?PCP is [facility]. ? ?Lab Results  ?Component Value Date  ? DIAGPAP  08/23/2019  ?  - Negative for intraepithelial lesion or malignancy (NILM)  ?  ?Review of Systems ?+pelvic pain, esp left side since December ?+pain with sex  ?+pressure with sitting and laying ?Reviewed past medical,surgical, social and family history. Reviewed medications and allergies.  ?   ?Objective:  ? Physical Exam ?BP 137/80 (BP Location: Left Arm, Patient Position: Sitting, Cuff Size: Normal)   Pulse (!) 106   Ht '4\' 11"'$  (1.499 m)   Wt 191 lb (86.6 kg)   LMP  (LMP Unknown)   Breastfeeding No   BMI 38.58 kg/m?   ?  LMP November ?Skin warm and dry.Pelvic: external genitalia is normal in appearance no lesions, vagina:pink and moist, no vaginal discharge or odor,has side wall tenderness,urethra has no lesions or masses noted, cervix: bulbous,+IUD strings, +CMT, uterus: normal size, shape and contour, +tender, no masses felt, adnexa: no masses, LLQ tenderness noted. Bladder is non tender and no masses felt.  ? [facility] - 07/15/21 1516   ? ?  ? Pregnancy Intention Screening  ? Does the patient want to become pregnant in the next year? No   ? Does the patient's partner want to become pregnant in the next year? No   ? Would the patient like to discuss contraceptive options today? No   ?  ? Contraception Wrap Up  ? Current Method IUD or IUS   ? End Method IUD or IUS   ? Contraception Counseling Provided No   ? ?  ?  ? ?  ? Examination chaperoned by Marcelino Scot RN ? ?Assessment:  ?   ?   1. Pelvic pain ?Will get [name] in 1 week in office to assess uterus and ovaries and see me after  ? ?2.  Dyspareunia, female ? ?3. Pelvic pressure in female ? ?4. IUD (intrauterine device) in place ?Placed 01/30/21 ? ?5. PID (acute pelvic inflammatory disease) ?Will give rocephin 500 mg iM in office and waited 15 minutes, has headache,discussed with Dr Nelda Marseille, can take tylenol ?Will rx flagyl 500 mg 1 bid x 14 days and doxycycline 100 mg 1 bid x 14 days  ?Meds ordered this encounter  ?Medications  ? cefTRIAXone (ROCEPHIN) injection 500 mg  ? doxycycline (VIBRA-TABS) 100 MG tablet  ?  Sig: Take 1 tablet (100 mg total) by mouth 2 (two) times daily. Take for 14 days  ?  Dispense:  28 tablet  ?  Refill:  0  ?  Order Specific Question:   Supervising Provider  ?  Answer:   Tania Ade H [[id number]]  ? metroNIDAZOLE (FLAGYL) 500 MG tablet  ?  Sig: Take 1 tablet (500 mg total) by mouth 2 (two) times daily. Take x 14 days  ?  Dispense:  28 tablet  ?  Refill:  0  ?  Order Specific Question:   Supervising Provider  ?  Answer:   Tania Ade H [[id number]]  ? Check CBC with diff ?   ?Plan:  ?   ?Return in 1  week for pelvic US and see me ?   ?

## 2021-07-16 LAB — CBC WITH DIFFERENTIAL/PLATELET
Basophils Absolute: 0.1 10*3/uL (ref 0.0–0.2)
Basos: 1 %
EOS (ABSOLUTE): 0.2 10*3/uL (ref 0.0–0.4)
Eos: 2 %
Hematocrit: 41.8 % (ref 34.0–46.6)
Hemoglobin: 14.1 g/dL (ref 11.1–15.9)
Immature Grans (Abs): 0 10*3/uL (ref 0.0–0.1)
Immature Granulocytes: 0 %
Lymphocytes Absolute: 2.1 10*3/uL (ref 0.7–3.1)
Lymphs: 20 %
MCH: 29.9 pg (ref 26.6–33.0)
MCHC: 33.7 g/dL (ref 31.5–35.7)
MCV: 89 fL (ref 79–97)
Monocytes Absolute: 0.8 10*3/uL (ref 0.1–0.9)
Monocytes: 8 %
Neutrophils Absolute: 7.2 10*3/uL — ABNORMAL HIGH (ref 1.4–7.0)
Neutrophils: 69 %
Platelets: 214 10*3/uL (ref 150–450)
RBC: 4.72 x10E6/uL (ref 3.77–5.28)
RDW: 12.5 % (ref 11.7–15.4)
WBC: 10.4 10*3/uL (ref 3.4–10.8)

## 2021-07-22 ENCOUNTER — Ambulatory Visit: Payer: Medicaid Other | Admitting: Adult Health

## 2021-07-22 ENCOUNTER — Ambulatory Visit (INDEPENDENT_AMBULATORY_CARE_PROVIDER_SITE_OTHER): Payer: Medicaid Other

## 2021-07-22 ENCOUNTER — Other Ambulatory Visit: Payer: Self-pay

## 2021-07-22 ENCOUNTER — Encounter: Payer: Self-pay | Admitting: Adult Health

## 2021-07-22 VITALS — BP 114/70 | HR 91 | Ht 59.0 in | Wt 189.0 lb

## 2021-07-22 DIAGNOSIS — N941 Unspecified dyspareunia: Secondary | ICD-10-CM

## 2021-07-22 DIAGNOSIS — R102 Pelvic and perineal pain: Secondary | ICD-10-CM | POA: Diagnosis not present

## 2021-07-22 DIAGNOSIS — Z975 Presence of (intrauterine) contraceptive device: Secondary | ICD-10-CM

## 2021-07-22 DIAGNOSIS — N730 Acute parametritis and pelvic cellulitis: Secondary | ICD-10-CM | POA: Diagnosis not present

## 2021-07-22 NOTE — Progress Notes (Signed)
PELVIC US TA/TV: homogeneous anteverted uterus,WNL,IUD is centrally located within the endometrium,EEC 6 mm,normal ovaries,no free fluid,ovaries appear mobile,left adnexal pain during ultrasound  ? ? ?

## 2021-07-22 NOTE — Progress Notes (Signed)
?  Subjective:  ?  ? Patient ID: Stacey Cook, female   DOB: 11-14-1995, 26 y.o.   MRN: [id number] ? ?HPI ?Stacey Cook is a 26 year old white female, married, [id number], in for pelvic US, was treated for PID 07/15/21 and is still taking doxycycline and flagyl.  ?Sex hurts still, but has less pain and pressure, can sit and lay down, now. ?WBC was 10.4 on 07/15/21. ?PCP is [facility]. ?Lab Results  ?Component Value Date  ? DIAGPAP  08/23/2019  ?  - Negative for intraepithelial lesion or malignancy (NILM)  ?  ?Review of Systems ?+pain with sex ?Still has some pain esp LLQ ?Reviewed past medical,surgical, social and family history. Reviewed medications and allergies.  ?   ?Objective:  ? Physical Exam ?BP 114/70 (BP Location: Left Arm, Patient Position: Sitting, Cuff Size: Normal)   Pulse 91   Ht '4\' 11"'$  (1.499 m)   Wt 189 lb (85.7 kg)   LMP  (LMP Unknown)   Breastfeeding No   BMI 38.17 kg/m?   ?  Skin warm and dry. Lungs: clear to ausculation bilaterally. Cardiovascular: regular rate and rhythm.  ?Fall risk is low ? [facility] - 07/22/21 1416   ? ?  ? Pregnancy Intention Screening  ? Does the patient want to become pregnant in the next year? Unsure   ? Does the patient's partner want to become pregnant in the next year? Unsure   ? Would the patient like to discuss contraceptive options today? No   ?  ? Contraception Wrap Up  ? Current Method IUD or IUS   ? End Method IUD or IUS   ? ?  ?  ? ?  ? US showed: normal uterus and ovaries, IUD in place, EEC 6 mm, did have pain LLQ with [name]. ? ?Assessment:  ?   ?1. Dyspareunia, female ?Change positions ? ?2. IUD (intrauterine device) in place ? ?3. Pelvic pain ?Will re assess when finished meds ? ?4. PID (acute pelvic inflammatory disease) ?Seems to be better, will finish doxycycline and flagyl next week ?   ?Plan:  ?   ?Follow up in 9 days, has cochlear surgery 07/29/21. ?   ?

## 2021-07-30 ENCOUNTER — Ambulatory Visit: Payer: Medicaid Other | Admitting: Adult Health

## 2021-07-31 ENCOUNTER — Ambulatory Visit: Payer: Medicaid Other | Admitting: Adult Health

## 2021-10-15 ENCOUNTER — Emergency Department (HOSPITAL_COMMUNITY)
Admission: EM | Admit: 2021-10-15 | Discharge: 2021-10-15 | Disposition: A | Discharge status: Home / Self Care | Payer: Medicaid Other | Attending: Emergency Medicine | Admitting: Emergency Medicine

## 2021-10-15 ENCOUNTER — Emergency Department (HOSPITAL_COMMUNITY): Payer: Medicaid Other

## 2021-10-15 ENCOUNTER — Other Ambulatory Visit: Payer: Self-pay

## 2021-10-15 ENCOUNTER — Encounter (HOSPITAL_COMMUNITY): Payer: Self-pay

## 2021-10-15 DIAGNOSIS — W010XXA Fall on same level from slipping, tripping and stumbling without subsequent striking against object, initial encounter: Secondary | ICD-10-CM | POA: Diagnosis not present

## 2021-10-15 DIAGNOSIS — W19XXXA Unspecified fall, initial encounter: Secondary | ICD-10-CM

## 2021-10-15 DIAGNOSIS — J45909 Unspecified asthma, uncomplicated: Secondary | ICD-10-CM | POA: Insufficient documentation

## 2021-10-15 DIAGNOSIS — M25561 Pain in right knee: Secondary | ICD-10-CM

## 2021-10-15 MED ORDER — MELOXICAM 15 MG PO TABS: 15.0000 mg | ORAL_TABLET | Freq: Every day | ORAL | 0 refills | Status: AC

## 2021-10-15 MED ORDER — ACETAMINOPHEN 500 MG PO TABS: 1000.0000 mg | ORAL_TABLET | Freq: Once | ORAL | Status: DC

## 2021-10-15 NOTE — Discharge Instructions (Addendum)
You were seen today for right knee pain after a fall.  This was wrapped with an Ace bandage for your comfort.  Use crutches to reduce weightbearing on the right lower extremity.  I have prescribed meloxicam, an anti-inflammatory.  This drug when combined with your Lexapro can increase the chances of GI bleeding.  If you notice any signs of GI bleeding such as blood in stool or bloody emesis, discontinue use immediately.  I also recommend discontinue use to be have stomach upset.  Do not take any other NSAID medication while taking meloxicam.  Follow-up with the orthopedic provider listed in paperwork

## 2021-10-15 NOTE — ED Notes (Signed)
An After Visit Summary was printed and given to the patient. Discharge instructions given and no further questions at this time.  
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
## 2021-10-15 NOTE — ED Provider Notes (Signed)
[facility] DEPT Provider Note   CSN: [id number] Arrival date & time: 10/15/21  1537     History  Chief Complaint  Patient presents with   Fall   Knee Injury    Stacey Cook is a 26 y.o. female.  Patient presents with right knee pain after a fall onto the right knee and subsequent roll over onto the right knee.  The patient states she has severe pain in the right knee at this time and that it hurts to bear weight.  No previous history of injury to the right knee.  Denies hitting head, denies loss of consciousness.  Past medical history significant for history of anxiety, headache, vomiting, mental disorder, asthma  HPI     Home Medications Prior to Admission medications   Medication Sig Start Date End Date Taking? Authorizing Provider  meloxicam (MOBIC) 15 MG tablet Take 1 tablet (15 mg total) by mouth daily for 15 days. 10/15/21 10/30/21 Yes Dorothyann Peng, PA-C  acetaminophen (TYLENOL) 325 MG tablet Take 2 tablets (650 mg total) by mouth every 6 (six) hours as needed for moderate pain. 02/14/20   Tilden Dome, MD  albuterol (PROVENTIL) (2.5 MG/3ML) 0.083% nebulizer solution SMARTSIG:1 Vial(s) Via Nebulizer Every 6-8 Hours PRN 01/29/20   [provider]  albuterol (VENTOLIN HFA) 108 (90 Base) MCG/ACT inhaler Inhale 1-2 puffs into the lungs every 6 (six) hours as needed for wheezing or shortness of breath. 03/13/21   Volney American, PA-C  amoxicillin (AMOXIL) 500 MG capsule Take 500 mg by mouth 2 (two) times daily. 07/16/21   [provider]  cloNIDine (CATAPRES) 0.2 MG tablet SMARTSIG:1 Tablet(s) By Mouth Patient not taking: Reported on 07/22/2021 07/20/21   [provider]  doxycycline (VIBRA-TABS) 100 MG tablet Take 1 tablet (100 mg total) by mouth 2 (two) times daily. Take for 14 days 07/15/21   Derrek Monaco A, NP  escitalopram (LEXAPRO) 10 MG tablet Take 10 mg by mouth at bedtime. Patient not taking: Reported on  07/22/2021 07/20/21   [provider]  metroNIDAZOLE (FLAGYL) 500 MG tablet Take 1 tablet (500 mg total) by mouth 2 (two) times daily. Take x 14 days 07/15/21   Estill Dooms, NP  Oxcarbazepine (TRILEPTAL) 300 MG tablet Take 300 mg by mouth 2 (two) times daily. Patient not taking: Reported on 07/22/2021 07/20/21   [provider]      Allergies    Dilaudid [hydromorphone], Keflex [cephalexin], Other, Zofran [ondansetron], and Adhesive [tape]    Review of Systems   Review of Systems  Musculoskeletal:  Positive for arthralgias and joint swelling.    Physical Exam Updated Vital Signs BP 105/79 (BP Location: Right Arm)   Pulse (!) 116   Temp 98.3 F (36.8 C) (Oral)   Resp 18   Ht '4\' 11"'$  (1.499 m)   Wt 87.5 kg   SpO2 96%   BMI 38.98 kg/m  Physical Exam Vitals and nursing note reviewed.  Constitutional:      General: She is not in acute distress. HENT:     Head: Normocephalic and atraumatic.  Eyes:     Conjunctiva/sclera: Conjunctivae normal.  Cardiovascular:     Rate and Rhythm: Normal rate.     Pulses: Normal pulses.  Pulmonary:     Effort: Pulmonary effort is normal.  Musculoskeletal:        General: Swelling (Minimal swelling noted around the right knee), tenderness (Generalized tenderness to palpation in the area around the  right knee) and signs of injury present. No deformity.     Cervical back: Normal range of motion.     Comments: Patient complains of severe pain with passive range of motion of the right knee  Neurological:     Mental Status: She is alert.     ED Results / Procedures / Treatments   Labs (all labs ordered are listed, but only abnormal results are displayed) Labs Reviewed - No data to display  EKG None  Radiology DG Knee Complete 4 Views Right  Result Date: 10/15/2021 CLINICAL DATA:  right knee injury EXAM: RIGHT KNEE - COMPLETE 4+ VIEW COMPARISON:  None Available. FINDINGS: No evidence of fracture, dislocation, or joint  effusion. No evidence of arthropathy or other focal bone abnormality. Soft tissues are unremarkable. IMPRESSION: Negative. Electronically Signed   By: Frazier Richards M.D.   On: 10/15/2021 16:12    Procedures Procedures    Medications Ordered in ED Medications  acetaminophen (TYLENOL) tablet 1,000 mg (has no administration in time range)    ED Course/ Medical Decision Making/ A&P                           Medical Decision Making Amount and/or Complexity of Data Reviewed Radiology: ordered.  Risk OTC drugs.   This patient presents to the ED for concern of right knee pain, this involves an extensive number of treatment options, and is a complaint that carries with it a high risk of complications and morbidity.  The differential diagnosis includes fracture, dislocation, ligamentous injury, and others   Imaging Studies ordered:  I ordered imaging studies including plain x-rays of the right knee I independently visualized and interpreted imaging which showed no fracture or dislocation I agree with the radiologist interpretation   Problem List / ED Course / Critical interventions / Medication management  I ordered medication including Tylenol for pain Reevaluation of the patient after these medicines showed that the patient improved I have reviewed the patients home medicines and have made adjustments as needed   Social Determinants of Health:  Patient has no transportation   Test / Admission - Considered:  Imaging shows no fracture or dislocation of the right knee.  Based on the patient's current pain level, it is possible that she may have done soft tissue damage during the fall.  No obvious positive test.  Anterior drawer negative, negative valgus stress test, negative [place].  Plan to discharge patient home with meloxicam prescription, crutches, and Ace wrap.  Patient will call and follow-up with orthopedics outpatient.  I discussed this with the patient and she  understands and agrees with the plan.        Final Clinical Impression(s) / ED Diagnoses Final diagnoses:  Fall, initial encounter  Acute pain of right knee    Rx / DC Orders ED Discharge Orders          Ordered    meloxicam (MOBIC) 15 MG tablet  Daily        10/15/21 1700              Ronny Bacon 10/15/21 1702    Carmin Muskrat, MD 10/15/21 2313

## 2021-11-15 ENCOUNTER — Encounter (HOSPITAL_COMMUNITY): Payer: Self-pay | Admitting: *Deleted

## 2021-11-15 ENCOUNTER — Emergency Department (HOSPITAL_COMMUNITY)
Admission: EM | Admit: 2021-11-15 | Discharge: 2021-11-16 | Disposition: A | Discharge status: Home / Self Care | Payer: Medicaid Other | Attending: Emergency Medicine | Admitting: Emergency Medicine

## 2021-11-15 ENCOUNTER — Other Ambulatory Visit: Payer: Self-pay

## 2021-11-15 DIAGNOSIS — N939 Abnormal uterine and vaginal bleeding, unspecified: Secondary | ICD-10-CM | POA: Diagnosis present

## 2021-11-15 DIAGNOSIS — N739 Female pelvic inflammatory disease, unspecified: Secondary | ICD-10-CM | POA: Insufficient documentation

## 2021-11-15 DIAGNOSIS — R109 Unspecified abdominal pain: Secondary | ICD-10-CM | POA: Diagnosis not present

## 2021-11-15 DIAGNOSIS — N730 Acute parametritis and pelvic cellulitis: Secondary | ICD-10-CM

## 2021-11-15 DIAGNOSIS — Z79899 Other long term (current) drug therapy: Secondary | ICD-10-CM | POA: Diagnosis not present

## 2021-11-15 HISTORY — DX: Unspecified ovarian cyst, unspecified side: N83.209

## 2021-11-15 LAB — URINALYSIS, ROUTINE W REFLEX MICROSCOPIC
Bilirubin Urine: NEGATIVE
Glucose, UA: NEGATIVE mg/dL
Ketones, ur: NEGATIVE mg/dL
Nitrite: NEGATIVE
Protein, ur: 100 mg/dL — AB
Specific Gravity, Urine: 1.032 — ABNORMAL HIGH (ref 1.005–1.030)
WBC, UA: >50 WBC/hpf — ABNORMAL HIGH (ref 0–5)
pH: 6 (ref 5.0–8.0)

## 2021-11-15 LAB — CBC
HCT: 41.1 % (ref 36.0–46.0)
Hemoglobin: 14 g/dL (ref 12.0–15.0)
MCH: 30.4 pg (ref 26.0–34.0)
MCHC: 34.1 g/dL (ref 30.0–36.0)
MCV: 89.3 fL (ref 80.0–100.0)
Platelets: 187 10*3/uL (ref 150–400)
RBC: 4.6 MIL/uL (ref 3.87–5.11)
RDW: 12.6 % (ref 11.5–15.5)
WBC: 8.9 10*3/uL (ref 4.0–10.5)
nRBC: 0 % (ref 0.0–0.2)

## 2021-11-15 MED ORDER — METOCLOPRAMIDE HCL 5 MG/ML IJ SOLN
10.0000 mg | Freq: Once | INTRAMUSCULAR | Status: AC
Start: 2021-11-15 — End: 2021-11-15
  Administered 2021-11-15: 10 mg via INTRAVENOUS
  Filled 2021-11-15: qty 2

## 2021-11-15 MED ORDER — MORPHINE SULFATE (PF) 4 MG/ML IV SOLN
4.0000 mg | Freq: Once | INTRAVENOUS | Status: AC
  Administered 2021-11-15: 4 mg via INTRAVENOUS
  Filled 2021-11-15: qty 1

## 2021-11-15 MED ORDER — SODIUM CHLORIDE 0.9 % IV BOLUS
1000.0000 mL | Freq: Once | INTRAVENOUS | Status: AC
  Administered 2021-11-15: 1000 mL via INTRAVENOUS

## 2021-11-15 NOTE — ED Triage Notes (Signed)
Pt with abd pain and passing "quarter sized" blood clots all day. Pt with IUD in place per pt since sept 2022, no period since November 2022.  Pt with severe cramping.

## 2021-11-16 LAB — COMPREHENSIVE METABOLIC PANEL
ALT: 27 U/L (ref 0–44)
AST: 22 U/L (ref 15–41)
Albumin: 4.2 g/dL (ref 3.5–5.0)
Alkaline Phosphatase: 111 U/L (ref 38–126)
Anion gap: 7 (ref 5–15)
BUN: 22 mg/dL — ABNORMAL HIGH (ref 6–20)
CO2: 22 mmol/L (ref 22–32)
Calcium: 9.3 mg/dL (ref 8.9–10.3)
Chloride: 107 mmol/L (ref 98–111)
Creatinine, Ser: 0.65 mg/dL (ref 0.44–1.00)
GFR, Estimated: >60 mL/min (ref >60)
Glucose, Bld: 105 mg/dL — ABNORMAL HIGH (ref 70–99)
Potassium: 3.7 mmol/L (ref 3.5–5.1)
Sodium: 136 mmol/L (ref 135–145)
Total Bilirubin: 0.6 mg/dL (ref 0.3–1.2)
Total Protein: 7.7 g/dL (ref 6.5–8.1)

## 2021-11-16 LAB — HCG, SERUM, QUALITATIVE: Preg, Serum: NEGATIVE

## 2021-11-16 LAB — LIPASE, BLOOD: Lipase: 38 U/L (ref 11–51)

## 2021-11-16 MED ORDER — DOXYCYCLINE HYCLATE 100 MG PO CAPS: 100.0000 mg | ORAL_CAPSULE | Freq: Two times a day (BID) | ORAL | 0 refills | Status: DC

## 2021-11-16 MED ORDER — IBUPROFEN 800 MG PO TABS: 800.0000 mg | ORAL_TABLET | Freq: Four times a day (QID) | ORAL | 0 refills | Status: DC | PRN

## 2021-11-16 MED ORDER — SODIUM CHLORIDE 0.9 % IV SOLN
1.0000 g | Freq: Once | INTRAVENOUS | Status: AC
  Administered 2021-11-16: 1 g via INTRAVENOUS
  Filled 2021-11-16: qty 10

## 2021-11-16 NOTE — ED Provider Notes (Signed)
[facility] EMERGENCY DEPARTMENT Provider Note   CSN: [id number] Arrival date & time: 11/15/21  2203     History  Chief Complaint  Patient presents with   Abdominal Pain    + passing blood clots vaginally     Stacey Cook is a 26 y.o. female.  Patient presents to the emergency department for evaluation of abdominal pain, vaginal bleeding.  Symptoms began today.  Patient reports that she has an IUD and has not had a menstrual period since September.  She has had some intermittent episodes of spotting but no heavy bleeding.  She is passing clots today and has intermittent severe spasms of pain in her low pelvis that feels like "contractions".  Patient does report that she was treated for PID several months ago.  There was talk about possible laparoscopic surgery by her OB/GYN to further evaluate this.  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       Home Medications Prior to Admission medications   Medication Sig Start Date End Date Taking? Authorizing Provider  doxycycline (VIBRAMYCIN) 100 MG capsule Take 1 capsule (100 mg total) by mouth 2 (two) times daily. 11/16/21  Yes Toribio Seiber, Gwenyth Allegra, MD  ibuprofen (ADVIL) 800 MG tablet Take 1 tablet (800 mg total) by mouth every 6 (six) hours as needed for moderate pain. 11/16/21  Yes Mikelle Myrick, Gwenyth Allegra, MD  acetaminophen (TYLENOL) 325 MG tablet Take 2 tablets (650 mg total) by mouth every 6 (six) hours as needed for moderate pain. 02/14/20   Tilden Dome, MD  albuterol (PROVENTIL) (2.5 MG/3ML) 0.083% nebulizer solution SMARTSIG:1 Vial(s) Via Nebulizer Every 6-8 Hours PRN 01/29/20   [provider]  albuterol (VENTOLIN HFA) 108 (90 Base) MCG/ACT inhaler Inhale 1-2 puffs into the lungs every 6 (six) hours as needed for wheezing or shortness of breath. 03/13/21   Volney American, PA-C  amoxicillin (AMOXIL) 500 MG capsule Take 500 mg by mouth 2 (two)  times daily. 07/16/21   [provider]  cloNIDine (CATAPRES) 0.2 MG tablet SMARTSIG:1 Tablet(s) By Mouth Patient not taking: Reported on 07/22/2021 07/20/21   [provider]  escitalopram (LEXAPRO) 10 MG tablet Take 10 mg by mouth at bedtime. Patient not taking: Reported on 07/22/2021 07/20/21   [provider]  metroNIDAZOLE (FLAGYL) 500 MG tablet Take 1 tablet (500 mg total) by mouth 2 (two) times daily. Take x 14 days 07/15/21   Estill Dooms, NP  Oxcarbazepine (TRILEPTAL) 300 MG tablet Take 300 mg by mouth 2 (two) times daily. Patient not taking: Reported on 07/22/2021 07/20/21   [provider]      Allergies    Dilaudid [hydromorphone], Keflex [cephalexin], Other, Zofran [ondansetron], and Adhesive [tape]    Review of Systems   Review of Systems  Physical Exam Updated Vital Signs BP 105/64 (BP Location: Left Arm)   Pulse 61   Temp 97.8 F (36.6 C) (Oral)   Resp 14   Ht 4' 11.5" (1.511 m)   Wt 88.9 kg   LMP  (LMP Unknown)   SpO2 100%   BMI 38.92 kg/m  Physical Exam Vitals and nursing note reviewed.  Constitutional:      General: She is not in acute distress.    Appearance: She is well-developed.  HENT:     Head: Normocephalic and atraumatic.     Mouth/Throat:     Mouth: Mucous membranes are moist.  Eyes:  General: Vision grossly intact. Gaze aligned appropriately.     Extraocular Movements: Extraocular movements intact.     Conjunctiva/sclera: Conjunctivae normal.  Cardiovascular:     Rate and Rhythm: Normal rate and regular rhythm.     Pulses: Normal pulses.     Heart sounds: Normal heart sounds, S1 normal and S2 normal. No murmur heard.    No friction rub. No gallop.  Pulmonary:     Effort: Pulmonary effort is normal. No respiratory distress.     Breath sounds: Normal breath sounds.  Abdominal:     General: Bowel sounds are normal.     Palpations: Abdomen is soft.     Tenderness: There is no abdominal tenderness. There  is no guarding or rebound.     Hernia: No hernia is present.  Genitourinary:    Comments: Blood in the vaginal vault, no clots, no active bleeding.  IUD string appears appropriately placed in the closed cervix.  She does have significant cervical motion tenderness. Musculoskeletal:        General: No swelling.     Cervical back: Full passive range of motion without pain, normal range of motion and neck supple. No spinous process tenderness or muscular tenderness. Normal range of motion.     Right lower leg: No edema.     Left lower leg: No edema.  Skin:    General: Skin is warm and dry.     Capillary Refill: Capillary refill takes less than 2 seconds.     Findings: No ecchymosis, erythema, rash or wound.  Neurological:     General: No focal deficit present.     Mental Status: She is alert and oriented to person, place, and time.     GCS: GCS eye subscore is 4. GCS verbal subscore is 5. GCS motor subscore is 6.     Cranial Nerves: Cranial nerves 2-12 are intact.     Sensory: Sensation is intact.     Motor: Motor function is intact.     Coordination: Coordination is intact.  Psychiatric:        Attention and Perception: Attention normal.        Mood and Affect: Mood normal.        Speech: Speech normal.        Behavior: Behavior normal.     ED Results / Procedures / Treatments   Labs (all labs ordered are listed, but only abnormal results are displayed) Labs Reviewed  COMPREHENSIVE METABOLIC PANEL - Abnormal; Notable for the following components:      Result Value   Glucose, Bld 105 (*)    BUN 22 (*)    All other components within normal limits  URINALYSIS, ROUTINE W REFLEX MICROSCOPIC - Abnormal; Notable for the following components:   APPearance CLOUDY (*)    Specific Gravity, Urine 1.032 (*)    Hgb urine dipstick LARGE (*)    Protein, ur 100 (*)    Leukocytes,Ua LARGE (*)    WBC, UA >50 (*)    Bacteria, UA RARE (*)    All other components within normal limits  LIPASE,  BLOOD  CBC  HCG, SERUM, QUALITATIVE  GC/CHLAMYDIA PROBE AMP () NOT AT [facility]    EKG None  Radiology No results found.  Procedures Procedures    Medications Ordered in ED Medications  cefTRIAXone (ROCEPHIN) 1 g in sodium chloride 0.9 % 100 mL IVPB (1 g Intravenous New Bag/Given 11/16/21 0123)  sodium chloride 0.9 % bolus 1,000 mL (0 mLs  Intravenous Stopped 11/16/21 0124)  morphine (PF) 4 MG/ML injection 4 mg (4 mg Intravenous Given 11/15/21 2351)  metoCLOPramide (REGLAN) injection 10 mg (10 mg Intravenous Given 11/15/21 2351)    ED Course/ Medical Decision Making/ A&P                           Medical Decision Making Amount and/or Complexity of Data Reviewed Labs: ordered.  Risk Prescription drug management.   Presents with vaginal bleeding and pain.  She does have an IUD in place.  No evidence of pregnancy at this time.  Patient reports passing intermittent small clots through the day and having a lot of pain and cramping.  Pelvic exam reveals no significant active bleeding, cervix is closed but she does have significant tenderness.  Is unclear if this is secondary to the bleeding and cramping or if it is a true PID.  She has recently been treated for PID, however, will restart treatment.  Patient will need to follow-up with OB/GYN for further management.        Final Clinical Impression(s) / ED Diagnoses Final diagnoses:  Abnormal uterine bleeding  PID (acute pelvic inflammatory disease)    Rx / DC Orders ED Discharge Orders          Ordered    ibuprofen (ADVIL) 800 MG tablet  Every 6 hours PRN        11/16/21 0109    doxycycline (VIBRAMYCIN) 100 MG capsule  2 times daily        11/16/21 0109              Orpah Greek, MD 11/16/21 0130

## 2021-11-17 LAB — GC/CHLAMYDIA PROBE AMP (~~LOC~~) NOT AT ARMC
Chlamydia: NEGATIVE
Comment: NEGATIVE
Comment: NORMAL
Neisseria Gonorrhea: NEGATIVE

## 2021-11-27 ENCOUNTER — Encounter (HOSPITAL_COMMUNITY): Payer: Self-pay | Admitting: Emergency Medicine

## 2021-11-27 ENCOUNTER — Other Ambulatory Visit: Payer: Self-pay

## 2021-11-27 ENCOUNTER — Emergency Department (HOSPITAL_COMMUNITY): Payer: Medicaid Other

## 2021-11-27 ENCOUNTER — Emergency Department (HOSPITAL_COMMUNITY)
Admission: EM | Admit: 2021-11-27 | Discharge: 2021-11-27 | Disposition: A | Discharge status: Home / Self Care | Payer: Medicaid Other | Attending: Emergency Medicine | Admitting: Emergency Medicine

## 2021-11-27 DIAGNOSIS — W1839XA Other fall on same level, initial encounter: Secondary | ICD-10-CM | POA: Insufficient documentation

## 2021-11-27 DIAGNOSIS — S4992XA Unspecified injury of left shoulder and upper arm, initial encounter: Secondary | ICD-10-CM | POA: Insufficient documentation

## 2021-11-27 DIAGNOSIS — S4991XA Unspecified injury of right shoulder and upper arm, initial encounter: Secondary | ICD-10-CM

## 2021-11-27 DIAGNOSIS — Y93E1 Activity, personal bathing and showering: Secondary | ICD-10-CM | POA: Diagnosis not present

## 2021-11-27 NOTE — Discharge Instructions (Signed)
Your x-rays were negative for acute abnormality. It appears that you have a hyperextension injury of the shoulder.  This is likely a soft tissue injury such as a strain of the muscle muscles or sprain of the ligaments.  I have given you a shoulder sling for comfort.  You may use Tylenol ice and ibuprofen.  You will need to follow-up with orthopedics for further evaluation if your symptoms are not improving.  Please take your arm out of the sling and move it several times a day to avoid frozen shoulder.

## 2021-11-27 NOTE — ED Triage Notes (Signed)
Pt c/o right knee, right shoulder/elbow/arm pain since slipping in tub today.

## 2021-11-27 NOTE — ED Provider Notes (Signed)
[facility] EMERGENCY DEPARTMENT Provider Note   CSN: [id number] Arrival date & time: 11/27/21  1935     History  Chief Complaint  Patient presents with   Stacey Cook is a 26 y.o. female who presents emergency department with chief complaint of fall.  Patient states that she is too short to reach for showerhead and had to adjust it for her daughter.  In doing so she fell into the shower and her right arm was hyperextended behind her back.  She has some numbness and tingling in her fingers and complains of severe pain in her right shoulder and elbow.  She also fell onto her knee but has been ambulatory on that.  She did not hit her head or lose consciousness.   Fall       Home Medications Prior to Admission medications   Medication Sig Start Date End Date Taking? Authorizing Provider  albuterol (PROVENTIL) (2.5 MG/3ML) 0.083% nebulizer solution Take 2.5 mg by nebulization See admin instructions. 1 vial via nebulizer every 6 to 8 hours as needed for shortness of breath 01/29/20  Yes [provider]  albuterol (VENTOLIN HFA) 108 (90 Base) MCG/ACT inhaler Inhale 1-2 puffs into the lungs every 6 (six) hours as needed for wheezing or shortness of breath. 03/13/21  Yes Volney American, PA-C  cloNIDine (CATAPRES) 0.2 MG tablet Take 0.2 mg by mouth at bedtime. 07/20/21  Yes [provider]  doxycycline (VIBRAMYCIN) 100 MG capsule Take 1 capsule (100 mg total) by mouth 2 (two) times daily. 11/16/21  Yes Pollina, Gwenyth Allegra, MD  escitalopram (LEXAPRO) 10 MG tablet Take 20 mg by mouth at bedtime. 07/20/21  Yes [provider]  ibuprofen (ADVIL) 800 MG tablet Take 1 tablet (800 mg total) by mouth every 6 (six) hours as needed for moderate pain. 11/16/21  Yes Pollina, Gwenyth Allegra, MD  levonorgestrel (MIRENA) 20 MCG/DAY IUD by Intrauterine route.   Yes [provider]  Oxcarbazepine (TRILEPTAL) 300 MG tablet Take 600 mg by mouth 2 (two) times  daily. 07/20/21  Yes [provider]  VYVANSE 50 MG capsule Take 50 mg by mouth every morning. 10/20/21  Yes [provider]      Allergies    Dilaudid [hydromorphone], Keflex [cephalexin], Other, Zofran [ondansetron], and Adhesive [tape]    Review of Systems   Review of Systems  Physical Exam Updated Vital Signs BP 114/81 (BP Location: Left Arm)   Pulse 87   Temp 97.7 F (36.5 C) (Oral)   Resp 17   Ht 4' 11.5" (1.511 m)   Wt 88.9 kg   LMP  (LMP Unknown)   SpO2 95%   BMI 38.92 kg/m  Physical Exam Vitals and nursing note reviewed.  Constitutional:      General: She is not in acute distress.    Appearance: She is well-developed. She is not diaphoretic.  HENT:     Head: Normocephalic and atraumatic.     Right Ear: External ear normal.     Left Ear: External ear normal.     Nose: Nose normal.     Mouth/Throat:     Mouth: Mucous membranes are moist.  Eyes:     General: No scleral icterus.    Conjunctiva/sclera: Conjunctivae normal.  Cardiovascular:     Rate and Rhythm: Normal rate and regular rhythm.     Heart sounds: Normal heart sounds. No murmur heard.    No friction rub. No gallop.  Pulmonary:  Effort: Pulmonary effort is normal. No respiratory distress.     Breath sounds: Normal breath sounds.  Abdominal:     General: Bowel sounds are normal. There is no distension.     Palpations: Abdomen is soft. There is no mass.     Tenderness: There is no abdominal tenderness. There is no guarding.  Musculoskeletal:     Cervical back: Normal range of motion.  Skin:    General: Skin is warm and dry.  Neurological:     Mental Status: She is alert and oriented to person, place, and time.  Psychiatric:        Behavior: Behavior normal.     ED Results / Procedures / Treatments   Labs (all labs ordered are listed, but only abnormal results are displayed) Labs Reviewed - No data to display  EKG None  Radiology No results  found.  Procedures Procedures    Medications Ordered in ED Medications - No data to display  ED Course/ Medical Decision Making/ A&P Clinical Course as of 11/27/21 2117  Fri Nov 27, 2021  2115 DG Humerus Right [AH]  2115 DG Hand Complete Right [AH]  2115 DG Shoulder Right Port [AH]  2115 DG Knee Right Port Visualized and interpreted plain films of the right shoulder, humerus, hand and right knee.  I personally interpreted these films, no acute findings. [AH]    Clinical Course User Index [AH] Margarita Mail, PA-C                           Medical Decision Making 26 year old female who has a fall and hyperextension injury of the right arm.  I personally visualized and interpreted all films ordered.  Patient has no acute findings.  I suspect sprain or strain.  We will give her a sling for comfort.  She is ambulatory on the right knee.  Discussed that the patient will need to remove the shoulder and mobilize it several times daily to avoid frozen shoulder.  I doubt any other emergent cause such as fracture or dislocation.  She will be given outpatient follow-up with orthopedics.  Neurovascularly intact and appears appropriate for discharge at this time.  Amount and/or Complexity of Data Reviewed Radiology: ordered. Decision-making details documented in ED Course.           Final Clinical Impression(s) / ED Diagnoses Final diagnoses:  Injury of right shoulder, initial encounter    Rx / DC Orders ED Discharge Orders     None         Margarita Mail, PA-C 11/27/21 2119    Noemi Chapel, MD 11/28/21 1038

## 2021-12-20 ENCOUNTER — Other Ambulatory Visit: Payer: Self-pay

## 2021-12-20 ENCOUNTER — Emergency Department (HOSPITAL_COMMUNITY)
Admission: EM | Admit: 2021-12-20 | Discharge: 2021-12-20 | Disposition: A | Discharge status: Home / Self Care | Payer: Medicaid Other | Attending: Emergency Medicine | Admitting: Emergency Medicine

## 2021-12-20 ENCOUNTER — Encounter (HOSPITAL_COMMUNITY): Payer: Self-pay

## 2021-12-20 DIAGNOSIS — J039 Acute tonsillitis, unspecified: Secondary | ICD-10-CM | POA: Diagnosis not present

## 2021-12-20 DIAGNOSIS — J029 Acute pharyngitis, unspecified: Secondary | ICD-10-CM | POA: Diagnosis present

## 2021-12-20 LAB — GROUP A STREP BY PCR: Group A Strep by PCR: NOT DETECTED

## 2021-12-20 MED ORDER — LIDOCAINE VISCOUS HCL 2 % MT SOLN: 15.0000 mL | OROMUCOSAL | 0 refills | Status: DC | PRN

## 2021-12-20 MED ORDER — PREDNISONE 10 MG PO TABS: 20.0000 mg | ORAL_TABLET | Freq: Every day | ORAL | 0 refills | Status: DC

## 2021-12-20 MED ORDER — DEXAMETHASONE SODIUM PHOSPHATE 10 MG/ML IJ SOLN
10.0000 mg | Freq: Once | INTRAMUSCULAR | Status: AC
  Administered 2021-12-20: 10 mg via INTRAMUSCULAR
  Filled 2021-12-20: qty 1

## 2021-12-20 NOTE — Discharge Instructions (Addendum)
You are seen in the emergency department for sore throat.  On exam your tonsils are inflamed but strep test is negative so I do not think you need an antibiotic.  You are treated with a shot of Decadron here in the ED which is a steroid that will help reduce inflammation.  I have sent a steroid taper to your pharmacy, take 40 mg daily for 5 days.  Please also use the viscous lidocaine as needed for symptom control.  He can take Tylenol for pain.  Return to the ED if you are unable to swallow, new and concerning symptoms.  Otherwise please Follow-Up with Ear, Nose and Throat for Reevaluation.

## 2021-12-20 NOTE — ED Triage Notes (Signed)
Pt presents to ED with complaints of swollen tonsils. Pt states hurts to swallow .

## 2021-12-20 NOTE — ED Provider Notes (Signed)
[facility] EMERGENCY DEPARTMENT Provider Note   CSN: [id number] Arrival date & time: 12/20/21  1136     History  Chief Complaint  Patient presents with   Sore Throat    Stacey [name] is a 26 y.o. female.   Sore Throat     Patient presents today due to sore throat.  Started acutely this morning, she feels like is very difficult to swallow due to pain.  She feels like her tonsils are swollen, she is not coughing, no vomiting or fevers at home.  States she gets tonsillitis very frequently, has never seen an ear nose and throat doctor.  No medicine prior to arrival, the pain and swelling is actually been improving throughout the ED stay without any medical intervention.  Denies any dental pain, which exposures, history of anaphylaxis, recent medication changes, history of angioedema.  Home Medications Prior to Admission medications   Medication Sig Start Date End Date Taking? Authorizing Provider  lidocaine (XYLOCAINE) 2 % solution Use as directed 15 mLs in the mouth or throat as needed for mouth pain. 12/20/21  Yes Sherrill Raring, PA-C  predniSONE (DELTASONE) 10 MG tablet Take 2 tablets (20 mg total) by mouth daily. 12/20/21  Yes Sherrill Raring, PA-C  albuterol (PROVENTIL) (2.5 MG/3ML) 0.083% nebulizer solution Take 2.5 mg by nebulization See admin instructions. 1 vial via nebulizer every 6 to 8 hours as needed for shortness of breath 01/29/20   [provider]  albuterol (VENTOLIN HFA) 108 (90 Base) MCG/ACT inhaler Inhale 1-2 puffs into the lungs every 6 (six) hours as needed for wheezing or shortness of breath. 03/13/21   Volney American, PA-C  cloNIDine (CATAPRES) 0.2 MG tablet Take 0.2 mg by mouth at bedtime. 07/20/21   [provider]  doxycycline (VIBRAMYCIN) 100 MG capsule Take 1 capsule (100 mg total) by mouth 2 (two) times daily. 11/16/21   Orpah Greek, MD  escitalopram (LEXAPRO) 10 MG tablet Take 20 mg by mouth at bedtime. 07/20/21   [provider]  ibuprofen (ADVIL) 800 MG tablet Take 1 tablet (800 mg total) by mouth every 6 (six) hours as needed for moderate pain. 11/16/21   Orpah Greek, MD  levonorgestrel (MIRENA) 20 MCG/DAY IUD by Intrauterine route.    [provider]  Oxcarbazepine (TRILEPTAL) 300 MG tablet Take 600 mg by mouth 2 (two) times daily. 07/20/21   [provider]  VYVANSE 50 MG capsule Take 50 mg by mouth every morning. 10/20/21   [provider]      Allergies    Dilaudid [hydromorphone], Keflex [cephalexin], Other, Zofran [ondansetron], and Adhesive [tape]    Review of Systems   Review of Systems  Physical Exam Updated Vital Signs BP 110/67   Pulse 83   Temp 98 F (36.7 C) (Oral)   Resp 16   Ht '4\' 11"'$  (1.499 m)   Wt 81.6 kg   SpO2 96%   BMI 36.36 kg/m  Physical Exam Vitals and nursing note reviewed. Exam conducted with a chaperone present.  Constitutional:      General: She is not in acute distress.    Appearance: Normal appearance.  HENT:     Head: Normocephalic and atraumatic.     Mouth/Throat:     Mouth: Mucous membranes are moist.     Pharynx: Uvula midline. Uvula swelling present.     Tonsils: No tonsillar exudate. 3+ on the right. 3+ on the left.     Comments: Mild uvular swelling.  Uvula is midline, handling secretions and protecting airway.  Bilateral tonsillar edema but no tonsillar exudate.  No submandibular tubular sublingual swelling, normal phonation. Eyes:     General: No scleral icterus.    Extraocular Movements: Extraocular movements intact.     Pupils: Pupils are equal, round, and reactive to light.  Skin:    Coloration: Skin is not jaundiced.  Neurological:     Mental Status: She is alert. Mental status is at baseline.     Coordination: Coordination normal.     ED Results / Procedures / Treatments   Labs (all labs ordered are listed, but only abnormal results are displayed) Labs Reviewed  GROUP A STREP BY PCR     EKG None  Radiology No results found.  Procedures Procedures    Medications Ordered in ED Medications  dexamethasone (DECADRON) injection 10 mg (10 mg Intramuscular Given 12/20/21 1346)    ED Course/ Medical Decision Making/ A&P                           Medical Decision Making [name] Prescription drug management.   Patient presents due to sore throat.  She does have symmetric bilateral tonsillar swelling and some mild uvular swelling.  Handling secretions protecting airway, no signs of distress of Ludwig angina, peritonsillar abscess, retropharyngeal abscess.  I ordered 10 mg IM Decadron, will also send prednisone burst and referral for ENT.  Strep test was negative, do not think antibiotics indicated at this point possibly viral tonsillitis.  Return precautions were discussed with the patient who verbalized her standing, discharged in stable condition.        Final Clinical Impression(s) / ED Diagnoses Final diagnoses:  Tonsillitis    Rx / DC Orders ED Discharge Orders          Ordered    lidocaine (XYLOCAINE) 2 % solution  As needed        12/20/21 1344    predniSONE (DELTASONE) 10 MG tablet  Daily        12/20/21 1345              Sherrill Raring, PA-C 12/20/21 1349    Cristie Hem, MD 12/20/21 1554

## 2022-03-06 ENCOUNTER — Emergency Department (HOSPITAL_COMMUNITY)
Admission: EM | Admit: 2022-03-06 | Discharge: 2022-03-07 | Disposition: A | Discharge status: Home / Self Care | Payer: Medicaid Other | Attending: Emergency Medicine | Admitting: Emergency Medicine

## 2022-03-06 ENCOUNTER — Other Ambulatory Visit: Payer: Self-pay

## 2022-03-06 ENCOUNTER — Encounter (HOSPITAL_COMMUNITY): Payer: Self-pay

## 2022-03-06 DIAGNOSIS — S93401A Sprain of unspecified ligament of right ankle, initial encounter: Secondary | ICD-10-CM

## 2022-03-06 DIAGNOSIS — M25571 Pain in right ankle and joints of right foot: Secondary | ICD-10-CM | POA: Insufficient documentation

## 2022-03-06 DIAGNOSIS — W19XXXA Unspecified fall, initial encounter: Secondary | ICD-10-CM

## 2022-03-06 DIAGNOSIS — W1781XA Fall down embankment (hill), initial encounter: Secondary | ICD-10-CM | POA: Diagnosis not present

## 2022-03-06 DIAGNOSIS — Y9389 Activity, other specified: Secondary | ICD-10-CM | POA: Insufficient documentation

## 2022-03-06 DIAGNOSIS — S99911A Unspecified injury of right ankle, initial encounter: Secondary | ICD-10-CM | POA: Diagnosis present

## 2022-03-06 NOTE — ED Triage Notes (Signed)
Pt arrived via [facility] c/o fall injury to right leg, ankle, foot, right hip. Fall occurred while being [place] by [employer] [place]. Pt reports she was carrying her little brother Sincere and took the most damage from the fall.

## 2022-03-07 ENCOUNTER — Emergency Department (HOSPITAL_COMMUNITY): Payer: Medicaid Other

## 2022-03-07 MED ORDER — HYDROCODONE-ACETAMINOPHEN 5-325 MG PO TABS
1.0000 | ORAL_TABLET | Freq: Once | ORAL | Status: AC
  Administered 2022-03-07: 1 via ORAL
  Filled 2022-03-07: qty 1

## 2022-03-07 NOTE — ED Provider Notes (Signed)
[facility] EMERGENCY DEPARTMENT Provider Note   CSN: [id number] Arrival date & time: 03/06/22  2223     History  Chief Complaint  Patient presents with   Stacey Cook is a 26 y.o. female.  The history is provided by the patient.  Fall  She has history of attention deficit disorder, oppositional defiant disorder and comes in following a fall.  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  She is complaining of pain in her right ankle going all the way up to her right hip.  She denies head injury, neck injury, back injury and denies any other extremity injury.   Home Medications Prior to Admission medications   Medication Sig Start Date End Date Taking? Authorizing Provider  albuterol (PROVENTIL) (2.5 MG/3ML) 0.083% nebulizer solution Take 2.5 mg by nebulization See admin instructions. 1 vial via nebulizer every 6 to 8 hours as needed for shortness of breath 01/29/20   [provider]  albuterol (VENTOLIN HFA) 108 (90 Base) MCG/ACT inhaler Inhale 1-2 puffs into the lungs every 6 (six) hours as needed for wheezing or shortness of breath. 03/13/21   Volney American, PA-C  cloNIDine (CATAPRES) 0.2 MG tablet Take 0.2 mg by mouth at bedtime. 07/20/21   [provider]  doxycycline (VIBRAMYCIN) 100 MG capsule Take 1 capsule (100 mg total) by mouth 2 (two) times daily. 11/16/21   Orpah Greek, MD  escitalopram (LEXAPRO) 10 MG tablet Take 20 mg by mouth at bedtime. 07/20/21   [provider]  ibuprofen (ADVIL) 800 MG tablet Take 1 tablet (800 mg total) by mouth every 6 (six) hours as needed for moderate pain. 11/16/21   Orpah Greek, MD  levonorgestrel (MIRENA) 20 MCG/DAY IUD by Intrauterine route.    [provider]  lidocaine (XYLOCAINE) 2 % solution Use as directed 15 mLs in  the mouth or throat as needed for mouth pain. 12/20/21   Sherrill Raring, PA-C  Oxcarbazepine (TRILEPTAL) 300 MG tablet Take 600 mg by mouth 2 (two) times daily. 07/20/21   [provider]  predniSONE (DELTASONE) 10 MG tablet Take 2 tablets (20 mg total) by mouth daily. 12/20/21   Sherrill Raring, PA-C  VYVANSE 50 MG capsule Take 50 mg by mouth every morning. 10/20/21   [provider]      Allergies    Dilaudid [hydromorphone], Keflex [cephalexin], Other, Zofran [ondansetron], and Adhesive [tape]    Review of Systems   Review of Systems  All other systems reviewed and are negative.   Physical Exam Updated Vital Signs BP 107/61 (BP Location: Left Arm)   Pulse (!) 110   Temp 98.1 F (36.7 C) (Oral)   Resp 18   Ht '4\' 11"'$  (1.499 m)   Wt 82 kg   SpO2 98%   BMI 36.51 kg/m  Physical Exam Vitals and nursing note reviewed.   26 year old female, resting comfortably and in no acute distress. Vital signs are significant for slightly elevated heart rate. Oxygen saturation is 98%, which is normal. Head is normocephalic and atraumatic. PERRLA, EOMI. Oropharynx is clear. Neck is nontender. Back is nontender. Lungs are clear without rales, wheezes, or rhonchi. Chest is nontender. Heart has regular rate and rhythm without murmur. Abdomen is soft, flat, nontender. Pelvis is stable and nontender. Extremities: There isno  deformity noted.  There is mild swelling over the lateral aspect of the right ankle.  There is tenderness to palpation rather diffusely from the right hip all the way down through the right thigh, knee, lower leg, ankle, foot.  Maximum tenderness seems to be over the ankle.  There is no instability of the ankle mortise and there is no instability of the knee.  There is marked pain with any passive movement of the right leg.  Full range of motion all other joints without pain. Skin is warm and dry without rash. Neurologic: Awake and alert.  Normal mental status.  ED  Results / Procedures / Treatments   Labs (all labs ordered are listed, but only abnormal results are displayed) Labs Reviewed  I-STAT BETA HCG BLOOD, ED ([facility], [facility], [facility] ONLY)   Radiology DG Foot Complete Right  Result Date: 03/07/2022 CLINICAL DATA:  Fall EXAM: RIGHT FOOT COMPLETE - 3+ VIEW COMPARISON:  None Available. FINDINGS: There is no evidence of fracture or dislocation. There is no evidence of arthropathy or other focal bone abnormality. Soft tissues are unremarkable. IMPRESSION: Negative. Electronically Signed   By: Rolm Baptise M.D.   On: 03/07/2022 02:40   DG Ankle Complete Right  Result Date: 03/07/2022 CLINICAL DATA:  Fall EXAM: RIGHT ANKLE - COMPLETE 3+ VIEW COMPARISON:  None Available. FINDINGS: Lateral soft tissue swelling. No acute bony abnormality. Specifically, no fracture, subluxation, or dislocation. Joint spaces maintained IMPRESSION: No acute bony abnormality. Electronically Signed   By: Rolm Baptise M.D.   On: 03/07/2022 02:39   DG Tibia/Fibula Right  Result Date: 03/07/2022 CLINICAL DATA:  Fall EXAM: RIGHT TIBIA AND FIBULA - 2 VIEW COMPARISON:  None Available. FINDINGS: There is no evidence of fracture or other focal bone lesions. Soft tissues are unremarkable. IMPRESSION: Negative. Electronically Signed   By: Rolm Baptise M.D.   On: 03/07/2022 02:39   DG Femur Min 2 Views Right  Result Date: 03/07/2022 CLINICAL DATA:  Fall EXAM: RIGHT FEMUR 2 VIEWS COMPARISON:  None Available. FINDINGS: There is no evidence of fracture or other focal bone lesions. Soft tissues are unremarkable. IMPRESSION: Negative. Electronically Signed   By: Rolm Baptise M.D.   On: 03/07/2022 02:39    Procedures Procedures    Medications Ordered in ED Medications  HYDROcodone-acetaminophen (NORCO/VICODIN) 5-325 MG per tablet 1 tablet (has no administration in time range)    ED Course/ Medical Decision Making/ A&P                           Medical Decision Making Amount and/or Complexity  of Data Reviewed Radiology: ordered.  Risk Prescription drug management.   Fall with injury to right leg.  X-rays have been ordered.  I have ordered a dose of hydrocodone-acetaminophen for pain.  X-rays showed no fractures, mild soft tissue swelling over the lateral aspect of the right ankle.  I have independently viewed the images, and agree with the radiologist's interpretation.  I have ordered an ankle splint orthotic to be applied.  Patient states that she cannot use crutches, so I have given her a walker to use as needed until she can put weight on her ankle comfortably.  She has seen Dr. Aline Brochure for orthopedic issues in the past and I have recommended that she follow-up with them.  She is instructed on ice and elevation.  Final Clinical Impression(s) / ED Diagnoses Final diagnoses:  Fall, initial encounter  Sprain of right ankle, initial  encounter    Rx / DC Orders ED Discharge Orders     None         Delora Fuel, MD 06/[birth year]96 [phone]

## 2022-03-07 NOTE — Discharge Instructions (Addendum)
Apply ice to all sore areas.  Ice to be applied for 30 minutes at a time, 4 times a day.  Take ibuprofen or naproxen as needed for pain.  If you need additional pain relief, add acetaminophen.  Please be aware that when you combine acetaminophen with either ibuprofen or naproxen, you get better pain relief and you get from taking either medication by itself.  Use the ankle splint orthotic in the walker as needed.

## 2022-03-19 ENCOUNTER — Encounter (HOSPITAL_COMMUNITY): Payer: Self-pay

## 2022-03-19 ENCOUNTER — Other Ambulatory Visit: Payer: Self-pay

## 2022-03-19 ENCOUNTER — Emergency Department (HOSPITAL_COMMUNITY)
Admission: EM | Admit: 2022-03-19 | Discharge: 2022-03-20 | Disposition: A | Discharge status: Home / Self Care | Payer: Medicaid Other | Attending: Student | Admitting: Student

## 2022-03-19 DIAGNOSIS — N3001 Acute cystitis with hematuria: Secondary | ICD-10-CM | POA: Insufficient documentation

## 2022-03-19 DIAGNOSIS — F17210 Nicotine dependence, cigarettes, uncomplicated: Secondary | ICD-10-CM | POA: Insufficient documentation

## 2022-03-19 DIAGNOSIS — J45909 Unspecified asthma, uncomplicated: Secondary | ICD-10-CM | POA: Diagnosis not present

## 2022-03-19 DIAGNOSIS — Z20822 Contact with and (suspected) exposure to covid-19: Secondary | ICD-10-CM | POA: Insufficient documentation

## 2022-03-19 DIAGNOSIS — R109 Unspecified abdominal pain: Secondary | ICD-10-CM | POA: Diagnosis present

## 2022-03-19 LAB — COMPREHENSIVE METABOLIC PANEL
ALT: 27 U/L (ref 0–44)
AST: 20 U/L (ref 15–41)
Albumin: 4.5 g/dL (ref 3.5–5.0)
Alkaline Phosphatase: 103 U/L (ref 38–126)
Anion gap: 10 (ref 5–15)
BUN: 13 mg/dL (ref 6–20)
CO2: 21 mmol/L — ABNORMAL LOW (ref 22–32)
Calcium: 9.8 mg/dL (ref 8.9–10.3)
Chloride: 108 mmol/L (ref 98–111)
Creatinine, Ser: 0.65 mg/dL (ref 0.44–1.00)
GFR, Estimated: >60 mL/min (ref >60)
Glucose, Bld: 91 mg/dL (ref 70–99)
Potassium: 4.1 mmol/L (ref 3.5–5.1)
Sodium: 139 mmol/L (ref 135–145)
Total Bilirubin: 0.8 mg/dL (ref 0.3–1.2)
Total Protein: 8 g/dL (ref 6.5–8.1)

## 2022-03-19 LAB — CBC
HCT: 47.5 % — ABNORMAL HIGH (ref 36.0–46.0)
Hemoglobin: 15.6 g/dL — ABNORMAL HIGH (ref 12.0–15.0)
MCH: 29.9 pg (ref 26.0–34.0)
MCHC: 32.8 g/dL (ref 30.0–36.0)
MCV: 91.2 fL (ref 80.0–100.0)
Platelets: 189 10*3/uL (ref 150–400)
RBC: 5.21 MIL/uL — ABNORMAL HIGH (ref 3.87–5.11)
RDW: 12.7 % (ref 11.5–15.5)
WBC: 12.8 10*3/uL — ABNORMAL HIGH (ref 4.0–10.5)
nRBC: 0 % (ref 0.0–0.2)

## 2022-03-19 LAB — POC URINE PREG, ED: Preg Test, Ur: NEGATIVE

## 2022-03-19 LAB — LIPASE, BLOOD: Lipase: 31 U/L (ref 11–51)

## 2022-03-19 NOTE — ED Triage Notes (Signed)
Pt c/o N/V/D for 3x days and feeling weak. Reports she hasn't been able to keep anything down.

## 2022-03-20 ENCOUNTER — Emergency Department (HOSPITAL_COMMUNITY): Payer: Medicaid Other

## 2022-03-20 LAB — GASTROINTESTINAL PANEL BY PCR, STOOL (REPLACES STOOL CULTURE)

## 2022-03-20 LAB — URINALYSIS, ROUTINE W REFLEX MICROSCOPIC
Bilirubin Urine: NEGATIVE
Glucose, UA: NEGATIVE mg/dL
Ketones, ur: 20 mg/dL — AB
Nitrite: NEGATIVE
Protein, ur: 100 mg/dL — AB
Specific Gravity, Urine: 1.03 (ref 1.005–1.030)
WBC, UA: >50 WBC/hpf — ABNORMAL HIGH (ref 0–5)
pH: 5 (ref 5.0–8.0)

## 2022-03-20 LAB — RESP PANEL BY RT-PCR (FLU A&B, COVID) ARPGX2
Influenza A by PCR: NEGATIVE
Influenza B by PCR: NEGATIVE
SARS Coronavirus 2 by RT PCR: NEGATIVE

## 2022-03-20 LAB — C DIFFICILE QUICK SCREEN W PCR REFLEX
C Diff antigen: NEGATIVE
C Diff interpretation: NOT DETECTED
C Diff toxin: NEGATIVE

## 2022-03-20 MED ORDER — PROCHLORPERAZINE EDISYLATE 10 MG/2ML IJ SOLN
10.0000 mg | Freq: Once | INTRAMUSCULAR | Status: AC
  Administered 2022-03-20: 10 mg via INTRAVENOUS
  Filled 2022-03-20: qty 2

## 2022-03-20 MED ORDER — LACTATED RINGERS IV BOLUS
1000.0000 mL | Freq: Once | INTRAVENOUS | Status: AC
  Administered 2022-03-20: 1000 mL via INTRAVENOUS

## 2022-03-20 MED ORDER — NITROFURANTOIN MONOHYD MACRO 100 MG PO CAPS: 100.0000 mg | ORAL_CAPSULE | Freq: Two times a day (BID) | ORAL | 0 refills | Status: DC

## 2022-03-20 MED ORDER — SODIUM CHLORIDE 0.9 % IV SOLN
2.0000 g | Freq: Once | INTRAVENOUS | Status: AC
  Administered 2022-03-20: 2 g via INTRAVENOUS
  Filled 2022-03-20: qty 20

## 2022-03-20 MED ORDER — ONDANSETRON 4 MG PO TBDP: 4.0000 mg | ORAL_TABLET | Freq: Three times a day (TID) | ORAL | 0 refills | Status: DC | PRN

## 2022-03-20 MED ORDER — DIPHENHYDRAMINE HCL 50 MG/ML IJ SOLN
25.0000 mg | Freq: Once | INTRAMUSCULAR | Status: AC
  Administered 2022-03-20: 25 mg via INTRAVENOUS
  Filled 2022-03-20: qty 1

## 2022-03-20 MED ORDER — IOHEXOL 300 MG/ML  SOLN
100.0000 mL | Freq: Once | INTRAMUSCULAR | Status: AC | PRN
  Administered 2022-03-20: 100 mL via INTRAVENOUS

## 2022-03-20 NOTE — ED Provider Notes (Signed)
[facility] EMERGENCY DEPARTMENT Provider Note  CSN: [id number] Arrival date & time: 03/19/22 1745  Chief Complaint(s) Nausea and Diarrhea  HPI Stacey Cook is a 26 y.o. female who presents to the emergency department for evaluation of abdominal pain, nausea, vomiting, and diarrhea.  Patient states that her symptoms been present for the last 3 days and she has had significant difficulty tolerating p.o.  She states that pain is worse in the right left lower quadrants and over the suprapubic region.  Endorses chills but denies documented fever.  Denies vaginal bleeding or discharge.  Patient arrives minimally hypotensive with initial blood pressure 98/58.   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   HSV infection    Low iron    Mental disorder    Ovarian cyst    PONV (postoperative nausea and vomiting)    Suicidal intent    UTI (lower urinary tract infection) 05/2014   Vomiting    Patient Active Problem List   Diagnosis Date Noted   IUD (intrauterine device) in place 07/15/2021   Pelvic pressure in female 07/15/2021   Dyspareunia, female 07/15/2021   Pelvic pain 07/15/2021   PID (acute pelvic inflammatory disease) 07/15/2021   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   Pregnancy examination or test, negative result 01/20/2021   Vaginal dryness 01/20/2021   General counseling and advice on contraceptive management 01/20/2021   Cellulitis of scalp 04/02/2019   Depression, postpartum 02/09/2019   Epidermal cyst 02/09/2019    Status post cesarean section 01/03/2019   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 with body mass index (BMI) of 37.0 to 37.9 in adult    Gastroesophageal reflux disease    Pancreatitis 01/12/2018   History of shoulder dystocia in prior pregnancy 12/02/2017   History of postpartum hemorrhage 12/02/2017   History of cholestasis during pregnancy 09/21/2017   History of gestational diabetes 08/24/2017   Chest pain due to GERD 08/05/2017   Depression with anxiety and suicidal ideations 06/13/2017   History of miscarriage 01/18/2017   Conductive hearing loss, unilat, unrestrict hearing contralateral side 12/10/2015   Binge-eating disorder, in partial remission, moderate 03/21/2015   Asthma, mild intermittent 02/18/2015   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 12/20/2014   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 Authorizing Provider  nitrofurantoin, macrocrystal-monohydrate, (MACROBID) 100 MG capsule Take 1 capsule (100 mg total) by mouth 2 (two) times daily. 03/20/22  Yes Deavion Strider, MD  ondansetron (ZOFRAN-ODT) 4 MG disintegrating tablet Take 1 tablet (4 mg total)  by mouth every 8 (eight) hours as needed for nausea or vomiting. 03/20/22  Yes Jazzman Loughmiller, MD  albuterol (PROVENTIL) (2.5 MG/3ML) 0.083% nebulizer solution Take 2.5 mg by nebulization See admin instructions. 1 vial via nebulizer every 6 to 8 hours as needed for shortness of breath 01/29/20   [provider]  albuterol (VENTOLIN HFA) 108 (90 Base) MCG/ACT inhaler Inhale 1-2 puffs into the lungs every 6 (six) hours  as needed for wheezing or shortness of breath. 03/13/21   Volney American, PA-C  cloNIDine (CATAPRES) 0.2 MG tablet Take 0.2 mg by mouth at bedtime. 07/20/21   [provider]  escitalopram (LEXAPRO) 10 MG tablet Take 20 mg by mouth at bedtime. 07/20/21   [provider]  ibuprofen (ADVIL) 800 MG tablet Take 1 tablet (800 mg total) by mouth every 6 (six) hours as needed for moderate pain. 11/16/21   Orpah Greek, MD  levonorgestrel (MIRENA) 20 MCG/DAY IUD by Intrauterine route.    [provider]  lidocaine (XYLOCAINE) 2 % solution Use as directed 15 mLs in the mouth or throat as needed for mouth pain. 12/20/21   Sherrill Raring, PA-C  Oxcarbazepine (TRILEPTAL) 300 MG tablet Take 600 mg by mouth 2 (two) times daily. 07/20/21   [provider]  VYVANSE 50 MG capsule Take 50 mg by mouth every morning. 10/20/21   [provider]                                                                                                                                    Past Surgical History Past Surgical History:  Procedure Laterality Date   CESAREAN SECTION N/A 01/03/2019   Procedure: CESAREAN SECTION;  Surgeon: [name], MD;  Location: [facility];  Service: Obstetrics;  Laterality: N/A;   CHOLECYSTECTOMY N/A 01/16/2018   Procedure: LAPAROSCOPIC CHOLECYSTECTOMY;  Surgeon: Aviva Signs, MD;  Location: [facility];  Service: General;  Laterality: N/A;  per Dr. Arnoldo Morale, pt will most likely go home and he will tell her to arrive at 10:45 - already has labs   CHOLESTEATOMA EXCISION     COCHLEAR IMPLANT Right    COCHLEAR IMPLANT REMOVAL Right 05/2020   DILATION AND EVACUATION N/A 09/11/2016   Procedure: [place];  Surgeon: Jonnie Kind, MD;  Location: [facility];  Service: Gynecology;  Laterality: N/A;   IMPLANTATION BONE ANCHORED HEARING AID Right 04/2013   INNER EAR SURGERY Right 03/31/2018   MIDDLE EAR SURGERY     28 surgeries  for cholesteatoma   TYMPANOPLASTY Left    TYMPANOSTOMY     Family History Family History  Problem Relation Age of Onset   Hypertension Maternal Grandmother    Diabetes Maternal Grandmother    Stroke Maternal Grandmother    Seizures Maternal Grandmother    Asthma Maternal Grandmother    Hyperlipidemia Maternal Grandmother    Thyroid disease Maternal  Grandmother    Cancer Maternal Grandmother    Cholelithiasis Mother    Kidney disease Mother        stones   Depression Mother    Asthma Brother    Scoliosis Daughter    Seizures Maternal Uncle    Cancer Other        breast- great aunt   Celiac disease Neg Hx     Social History Social History   Tobacco Use   Smoking status: Every Day    Packs/day: 0.50    Years: 1.00    Total pack years: 0.50    Types: Cigarettes    Last attempt to quit: 05/2021    Years since quitting: 0.8   Smokeless tobacco: Never   Tobacco comments:    "3-4 cigs a day "  Vaping Use   Vaping Use: Former   Substances: Flavoring  Substance Use Topics   Alcohol use: Yes    Comment: occ   Drug use: No   Allergies Dilaudid [hydromorphone], Keflex [cephalexin], Other, and Adhesive [tape]  Review of Systems Review of Systems  Constitutional:  Positive for chills.  Gastrointestinal:  Positive for abdominal pain, nausea and vomiting.    Physical Exam Vital Signs  I have reviewed the triage vital signs BP (!) 122/59 (BP Location: Right Wrist)   Pulse 80   Temp 98 F (36.7 C) (Oral)   Resp 14   Ht '4\' 11"'$  (1.499 m)   Wt 85 kg   SpO2 98%   BMI 37.85 kg/m   Physical Exam Vitals and nursing note reviewed.  Constitutional:      General: She is not in acute distress.    Appearance: She is well-developed. She is ill-appearing.  HENT:     Head: Normocephalic and atraumatic.  Eyes:     Conjunctiva/sclera: Conjunctivae normal.  Cardiovascular:     Rate and Rhythm: Normal rate and regular rhythm.     Heart sounds: No murmur heard. Pulmonary:      Effort: Pulmonary effort is normal. No respiratory distress.     Breath sounds: Normal breath sounds.  Abdominal:     Palpations: Abdomen is soft.     Tenderness: There is abdominal tenderness.  Musculoskeletal:        General: No swelling.     Cervical back: Neck supple.  Skin:    General: Skin is warm and dry.     Capillary Refill: Capillary refill takes less than 2 seconds.  Neurological:     Mental Status: She is alert.  Psychiatric:        Mood and Affect: Mood normal.     ED Results and Treatments Labs (all labs ordered are listed, but only abnormal results are displayed) Labs Reviewed  COMPREHENSIVE METABOLIC PANEL - Abnormal; Notable for the following components:      Result Value   CO2 21 (*)    All other components within normal limits  CBC - Abnormal; Notable for the following components:   WBC 12.8 (*)    RBC 5.21 (*)    Hemoglobin 15.6 (*)    HCT 47.5 (*)    All other components within normal limits  URINALYSIS, ROUTINE W REFLEX MICROSCOPIC - Abnormal; Notable for the following components:   Color, Urine AMBER (*)    APPearance CLOUDY (*)    Hgb urine dipstick SMALL (*)    Ketones, ur 20 (*)    Protein, ur 100 (*)    Leukocytes,Ua LARGE (*)  WBC, UA >50 (*)    Bacteria, UA RARE (*)    All other components within normal limits  RESP PANEL BY RT-PCR (FLU A&B, COVID) ARPGX2  C DIFFICILE QUICK SCREEN W PCR REFLEX    GASTROINTESTINAL PANEL BY PCR, STOOL (REPLACES STOOL CULTURE)  LIPASE, BLOOD  POC URINE PREG, ED                                                                                                                          Radiology CT ABDOMEN PELVIS W CONTRAST  Result Date: 03/20/2022 CLINICAL DATA:  Left lower quadrant pain. EXAM: CT ABDOMEN AND PELVIS WITH CONTRAST TECHNIQUE: Multidetector CT imaging of the abdomen and pelvis was performed using the standard protocol following bolus administration of intravenous contrast. RADIATION DOSE  REDUCTION: This exam was performed according to the departmental dose-optimization program which includes automated exposure control, adjustment of the mA and/or kV according to patient size and/or use of iterative reconstruction technique. CONTRAST:  121m OMNIPAQUE IOHEXOL 300 MG/ML  SOLN COMPARISON:  November 04, 2020 FINDINGS: Lower chest: No acute abnormality. Hepatobiliary: No focal liver abnormality is seen. Status post cholecystectomy. No biliary dilatation. Pancreas: Unremarkable. No pancreatic ductal dilatation or surrounding inflammatory changes. Spleen: Normal in size without focal abnormality. Adrenals/Urinary Tract: Adrenal glands are unremarkable. Kidneys are normal, without renal calculi, focal lesion, or hydronephrosis. Bladder is unremarkable. Stomach/Bowel: Stomach is within normal limits. Appendix appears normal. No evidence of bowel wall thickening, distention, or inflammatory changes. Vascular/Lymphatic: No significant vascular findings are present. No enlarged abdominal or pelvic lymph nodes. Reproductive: A probably positioned IUD is seen within and normal appearing uterus. The bilateral adnexa are unremarkable. Other: No abdominal wall hernia or abnormality. No abdominopelvic ascites. Musculoskeletal: No acute or significant osseous findings. IMPRESSION: 1. No acute or active process within the abdomen or pelvis. 2. Evidence of prior cholecystectomy. Electronically Signed   By: TVirgina NorfolkM.D.   On: 03/20/2022 01:45    Pertinent labs & imaging results that were available during my care of the patient were reviewed by me and considered in my medical decision making (see MDM for details).  Medications Ordered in ED Medications  lactated ringers bolus 1,000 mL (1,000 mLs Intravenous New Bag/Given 03/20/22 0117)  prochlorperazine (COMPAZINE) injection 10 mg (10 mg Intravenous Given 03/20/22 0118)  diphenhydrAMINE (BENADRYL) injection 25 mg (25 mg Intravenous Given 03/20/22 0117)   iohexol (OMNIPAQUE) 300 MG/ML solution 100 mL (100 mLs Intravenous Contrast Given 03/20/22 0127)  cefTRIAXone (ROCEPHIN) 2 g in sodium chloride 0.9 % 100 mL IVPB (2 g Intravenous New Bag/Given 03/20/22 0141)  Procedures .Critical Care  Performed by: Teressa Lower, MD Authorized by: Teressa Lower, MD   Critical care provider statement:    Critical care time (minutes):  30   Critical care was necessary to treat or prevent imminent or life-threatening deterioration of the following conditions:  Circulatory failure and dehydration   Critical care was time spent personally by me on the following activities:  Development of treatment plan with patient or surrogate, discussions with consultants, evaluation of patient's response to treatment, examination of patient, ordering and review of laboratory studies, ordering and review of radiographic studies, ordering and performing treatments and interventions, pulse oximetry, re-evaluation of patient's condition and review of old charts   (including critical care time)  Medical Decision Making / ED Course   This patient presents to the ED for concern of abdominal pain, nausea, vomiting, diarrhea, this involves an extensive number of treatment options, and is a complaint that carries with it a high risk of complications and morbidity.  The differential diagnosis includes UTI, diverticulitis, obstruction, intra-abdominal abscess, PID gastroenteritis  MDM: Patient seen emergency room for evaluation of abdominal pain, nausea, vomiting, diarrhea.  Physical exam with tenderness in the right left lower quadrants as well as the suprapubic region.  Laboratory evaluation with a leukocytosis to 12.8, hemoglobin 15.6, COVID and flu negative, pregnancy negative, C. difficile negative.  ET abdomen pelvis unremarkable.  Urinalysis  concerning with large leuk esterase, greater than 50 white blood cells, rare bacteria, white blood cell clumps and mucus as well as budding yeast.  Symptoms controlled here in the emergency department and she was fluid resuscitated with improvement of her hypotension.  Ceftriaxone prescribed for her UTI and patient discharged on Macrobid given her intolerance to Keflex.  On reevaluation, patient much improved and she will follow-up outpatient with return precautions.   Additional history obtained:  -External records from outside source obtained and reviewed including: Chart review including previous notes, labs, imaging, consultation notes   Lab Tests: -I ordered, reviewed, and interpreted labs.   The pertinent results include:   Labs Reviewed  COMPREHENSIVE METABOLIC PANEL - Abnormal; Notable for the following components:      Result Value   CO2 21 (*)    All other components within normal limits  CBC - Abnormal; Notable for the following components:   WBC 12.8 (*)    RBC 5.21 (*)    Hemoglobin 15.6 (*)    HCT 47.5 (*)    All other components within normal limits  URINALYSIS, ROUTINE W REFLEX MICROSCOPIC - Abnormal; Notable for the following components:   Color, Urine AMBER (*)    APPearance CLOUDY (*)    Hgb urine dipstick SMALL (*)    Ketones, ur 20 (*)    Protein, ur 100 (*)    Leukocytes,Ua LARGE (*)    WBC, UA >50 (*)    Bacteria, UA RARE (*)    All other components within normal limits  RESP PANEL BY RT-PCR (FLU A&B, COVID) ARPGX2  C DIFFICILE QUICK SCREEN W PCR REFLEX    GASTROINTESTINAL PANEL BY PCR, STOOL (REPLACES STOOL CULTURE)  LIPASE, BLOOD  POC URINE PREG, ED      Imaging Studies ordered: I ordered imaging studies including CT and pelvis I independently visualized and interpreted imaging. I agree with the radiologist interpretation   Medicines ordered and prescription drug management: Meds ordered this encounter  Medications   lactated ringers bolus  1,000 mL   prochlorperazine (COMPAZINE) injection 10 mg   diphenhydrAMINE (BENADRYL)  injection 25 mg   iohexol (OMNIPAQUE) 300 MG/ML solution 100 mL   cefTRIAXone (ROCEPHIN) 2 g in sodium chloride 0.9 % 100 mL IVPB    Order Specific Question:   Antibiotic Indication:    Answer:   UTI   nitrofurantoin, macrocrystal-monohydrate, (MACROBID) 100 MG capsule    Sig: Take 1 capsule (100 mg total) by mouth 2 (two) times daily.    Dispense:  10 capsule    Refill:  0   ondansetron (ZOFRAN-ODT) 4 MG disintegrating tablet    Sig: Take 1 tablet (4 mg total) by mouth every 8 (eight) hours as needed for nausea or vomiting.    Dispense:  20 tablet    Refill:  0    -I have reviewed the patients home medicines and have made adjustments as needed  Critical interventions Resuscitation, antibiotis  Cardiac Monitoring: The patient was maintained on a cardiac monitor.  I personally viewed and interpreted the cardiac monitored which showed an underlying rhythm of: NSR  Social Determinants of Health:  Factors impacting patients care include: none   Reevaluation: After the interventions noted above, I reevaluated the patient and found that they have :improved  Co morbidities that complicate the patient evaluation  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

## 2022-03-20 NOTE — Progress Notes (Signed)
Pt IV removed and and discharge was discussed with patient along with new prescriptions. Pt verbalized understanding. Grandfather came to pick pt up as she does not drive. Pt ambulated to vehicle. Pt left in stable condition.

## 2022-03-31 ENCOUNTER — Ambulatory Visit: Payer: Self-pay | Admitting: Adult Health

## 2022-07-05 ENCOUNTER — Encounter (HOSPITAL_COMMUNITY): Payer: Self-pay | Admitting: *Deleted

## 2022-07-05 ENCOUNTER — Emergency Department (HOSPITAL_COMMUNITY)
Admission: EM | Admit: 2022-07-05 | Discharge: 2022-07-05 | Disposition: A | Discharge status: Home / Self Care | Payer: Medicaid Other | Attending: Emergency Medicine | Admitting: Emergency Medicine

## 2022-07-05 ENCOUNTER — Other Ambulatory Visit: Payer: Self-pay

## 2022-07-05 DIAGNOSIS — R109 Unspecified abdominal pain: Secondary | ICD-10-CM | POA: Diagnosis present

## 2022-07-05 DIAGNOSIS — R102 Pelvic and perineal pain: Secondary | ICD-10-CM | POA: Insufficient documentation

## 2022-07-05 LAB — URINALYSIS, ROUTINE W REFLEX MICROSCOPIC
Bilirubin Urine: NEGATIVE
Glucose, UA: NEGATIVE mg/dL
Hgb urine dipstick: NEGATIVE
Ketones, ur: NEGATIVE mg/dL
Nitrite: NEGATIVE
Protein, ur: NEGATIVE mg/dL
Specific Gravity, Urine: 1.02 (ref 1.005–1.030)
pH: 6 (ref 5.0–8.0)

## 2022-07-05 LAB — COMPREHENSIVE METABOLIC PANEL
ALT: 26 U/L (ref 0–44)
AST: 22 U/L (ref 15–41)
Albumin: 4.1 g/dL (ref 3.5–5.0)
Alkaline Phosphatase: 92 U/L (ref 38–126)
Anion gap: 11 (ref 5–15)
BUN: 16 mg/dL (ref 6–20)
CO2: 23 mmol/L (ref 22–32)
Calcium: 9.4 mg/dL (ref 8.9–10.3)
Chloride: 101 mmol/L (ref 98–111)
Creatinine, Ser: 0.56 mg/dL (ref 0.44–1.00)
GFR, Estimated: >60 mL/min (ref >60)
Glucose, Bld: 92 mg/dL (ref 70–99)
Potassium: 4 mmol/L (ref 3.5–5.1)
Sodium: 135 mmol/L (ref 135–145)
Total Bilirubin: 0.6 mg/dL (ref 0.3–1.2)
Total Protein: 7.8 g/dL (ref 6.5–8.1)

## 2022-07-05 LAB — CBC
HCT: 40.2 % (ref 36.0–46.0)
Hemoglobin: 13.8 g/dL (ref 12.0–15.0)
MCH: 30.7 pg (ref 26.0–34.0)
MCHC: 34.3 g/dL (ref 30.0–36.0)
MCV: 89.5 fL (ref 80.0–100.0)
Platelets: 210 10*3/uL (ref 150–400)
RBC: 4.49 MIL/uL (ref 3.87–5.11)
RDW: 12.4 % (ref 11.5–15.5)
WBC: 9.3 10*3/uL (ref 4.0–10.5)
nRBC: 0 % (ref 0.0–0.2)

## 2022-07-05 LAB — LIPASE, BLOOD: Lipase: 34 U/L (ref 11–51)

## 2022-07-05 LAB — POC URINE PREG, ED: Preg Test, Ur: NEGATIVE

## 2022-07-05 LAB — HCG, QUANTITATIVE, PREGNANCY: hCG, Beta Chain, Quant, S: <1 m[IU]/mL (ref <5)

## 2022-07-05 MED ORDER — SODIUM CHLORIDE 0.9 % IV BOLUS
1000.0000 mL | Freq: Once | INTRAVENOUS | Status: AC
  Administered 2022-07-05: 1000 mL via INTRAVENOUS

## 2022-07-05 MED ORDER — ONDANSETRON HCL 4 MG/2ML IJ SOLN
4.0000 mg | Freq: Once | INTRAMUSCULAR | Status: AC
  Administered 2022-07-05: 4 mg via INTRAVENOUS
  Filled 2022-07-05: qty 2

## 2022-07-05 MED ORDER — ONDANSETRON 4 MG PO TBDP: 4.0000 mg | ORAL_TABLET | Freq: Once | ORAL | Status: DC

## 2022-07-05 MED ORDER — MELOXICAM 7.5 MG PO TABS: 7.5000 mg | ORAL_TABLET | Freq: Every day | ORAL | 0 refills | Status: AC

## 2022-07-05 MED ORDER — MELOXICAM 7.5 MG PO TABS: 7.5000 mg | ORAL_TABLET | Freq: Every day | ORAL | 0 refills | Status: DC

## 2022-07-05 MED ORDER — KETOROLAC TROMETHAMINE 30 MG/ML IJ SOLN
30.0000 mg | Freq: Once | INTRAMUSCULAR | Status: AC
  Administered 2022-07-05: 30 mg via INTRAVENOUS
  Filled 2022-07-05: qty 1

## 2022-07-05 NOTE — ED Provider Notes (Signed)
[place] Provider Note   CSN: [id number] Arrival date & time: 07/05/22  1955     History  Chief Complaint  Patient presents with   Abdominal Pain    Stacey Cook is a 27 y.o. female.   Abdominal Pain    This patient is a 27 year old female, she endorses a history of having a heavy period recently.  She has an IUD which she has had for couple of years.  Over the last week after having a couple of days of heavy bleeding she had some increasing abdominal cramping which is across the lower abdomen.  No dysuria, no vaginal discharge, no risk for STDs, denies any new sexual partners.  She has had no constipation or diarrhea.  She has had a little bit of nausea.  She has not been taking anything at home for the pain.  She did have a positive pregnancy test at home a couple of weeks ago.  Home Medications Prior to Admission medications   Medication Sig Start Date End Date Taking? Authorizing Provider  meloxicam (MOBIC) 7.5 MG tablet Take 1 tablet (7.5 mg total) by mouth daily for 20 days. 07/05/22 07/25/22 Yes Noemi Chapel, MD  albuterol (PROVENTIL) (2.5 MG/3ML) 0.083% nebulizer solution Take 2.5 mg by nebulization See admin instructions. 1 vial via nebulizer every 6 to 8 hours as needed for shortness of breath 01/29/20   [provider]  albuterol (VENTOLIN HFA) 108 (90 Base) MCG/ACT inhaler Inhale 1-2 puffs into the lungs every 6 (six) hours as needed for wheezing or shortness of breath. 03/13/21   Volney American, PA-C  cloNIDine (CATAPRES) 0.2 MG tablet Take 0.2 mg by mouth at bedtime. 07/20/21   [provider]  escitalopram (LEXAPRO) 10 MG tablet Take 20 mg by mouth at bedtime. 07/20/21   [provider]  ibuprofen (ADVIL) 800 MG tablet Take 1 tablet (800 mg total) by mouth every 6 (six) hours as needed for moderate pain. 11/16/21   Orpah Greek, MD  levonorgestrel (MIRENA) 20 MCG/DAY IUD by  Intrauterine route.    [provider]  lidocaine (XYLOCAINE) 2 % solution Use as directed 15 mLs in the mouth or throat as needed for mouth pain. 12/20/21   Sherrill Raring, PA-C  nitrofurantoin, macrocrystal-monohydrate, (MACROBID) 100 MG capsule Take 1 capsule (100 mg total) by mouth 2 (two) times daily. 03/20/22   Kommor, Madison, MD  ondansetron (ZOFRAN-ODT) 4 MG disintegrating tablet Take 1 tablet (4 mg total) by mouth every 8 (eight) hours as needed for nausea or vomiting. 03/20/22   Kommor, Madison, MD  Oxcarbazepine (TRILEPTAL) 300 MG tablet Take 600 mg by mouth 2 (two) times daily. 07/20/21   [provider]  VYVANSE 50 MG capsule Take 50 mg by mouth every morning. 10/20/21   [provider]      Allergies    Dilaudid [hydromorphone], Keflex [cephalexin], Other, and Adhesive [tape]    Review of Systems   Review of Systems  Gastrointestinal:  Positive for abdominal pain.  All other systems reviewed and are negative.   Physical Exam Updated Vital Signs BP (!) 122/55 (BP Location: Right Arm)   Pulse 95   Temp 98 F (36.7 C) (Oral)   Resp 16   SpO2 100%  Physical Exam Vitals and nursing note reviewed.  Constitutional:      General: She is not in acute distress.    Appearance: She is well-developed.  HENT:  Head: Normocephalic and atraumatic.     Mouth/Throat:     Pharynx: No oropharyngeal exudate.  Eyes:     General: No scleral icterus.       Right eye: No discharge.        Left eye: No discharge.     Conjunctiva/sclera: Conjunctivae normal.     Pupils: Pupils are equal, round, and reactive to light.  Neck:     Thyroid: No thyromegaly.     Vascular: No JVD.  Cardiovascular:     Rate and Rhythm: Normal rate and regular rhythm.     Heart sounds: Normal heart sounds. No murmur heard.    No friction rub. No gallop.  Pulmonary:     Effort: Pulmonary effort is normal. No respiratory distress.     Breath sounds: Normal breath sounds. No wheezing  or rales.  Abdominal:     General: Bowel sounds are normal. There is no distension.     Palpations: Abdomen is soft. There is no mass.     Tenderness: There is no abdominal tenderness.     Comments: When distracted the patient does not have any significant tenderness, she does not guard, she does not wince when I examined her abdomen.  There is no focal tenderness  Musculoskeletal:        General: No tenderness. Normal range of motion.     Cervical back: Normal range of motion and neck supple.  Lymphadenopathy:     Cervical: No cervical adenopathy.  Skin:    General: Skin is warm and dry.     Findings: No erythema or rash.  Neurological:     Mental Status: She is alert.     Coordination: Coordination normal.  Psychiatric:        Behavior: Behavior normal.     ED Results / Procedures / Treatments   Labs (all labs ordered are listed, but only abnormal results are displayed) Labs Reviewed  URINALYSIS, ROUTINE W REFLEX MICROSCOPIC - Abnormal; Notable for the following components:      Result Value   APPearance HAZY (*)    Leukocytes,Ua LARGE (*)    Bacteria, UA RARE (*)    All other components within normal limits  URINE CULTURE  LIPASE, BLOOD  COMPREHENSIVE METABOLIC PANEL  CBC  HCG, QUANTITATIVE, PREGNANCY  POC URINE PREG, ED    EKG None  Radiology No results found.  Procedures Procedures    Medications Ordered in ED Medications  ondansetron (ZOFRAN-ODT) disintegrating tablet 4 mg (4 mg Oral Not Given 07/05/22 2118)  sodium chloride 0.9 % bolus 1,000 mL (0 mLs Intravenous Stopped 07/05/22 2225)  ondansetron (ZOFRAN) injection 4 mg (4 mg Intravenous Given 07/05/22 2118)  ketorolac (TORADOL) 30 MG/ML injection 30 mg (30 mg Intravenous Given 07/05/22 2225)    ED Course/ Medical Decision Making/ A&P                             Medical Decision Making Amount and/or Complexity of Data Reviewed Labs: ordered.  Risk Prescription drug management.   No distress,  patient well-appearing, not tachycardic, normotensive, no fever no tachycardia no hypoxia no tachypnea.  She has some abdominal cramping but has totally normal labs including a CBC metabolic panel, urine pregnancy is negative.  Urinalysis is pending.  I have given her Toradol and IV fluids, she feels better, she has a nonsurgical abdomen without a leukocytosis I do not think she needs a CT scan.  She is not pregnant.  Labs: Normal CBC and metabolic panel, urinalysis is clean without signs of infection, culture sent due to a couple of white blood cells.  Given IV fluids and Toradol, feeling better  Given instructions on return, stable for discharge        Final Clinical Impression(s) / ED Diagnoses Final diagnoses:  Abdominal pain, unspecified abdominal location  Pelvic pain in female    Rx / DC Orders ED Discharge Orders          Ordered    meloxicam (MOBIC) 7.5 MG tablet  Daily        07/05/22 2250              Noemi Chapel, MD 07/05/22 2251

## 2022-07-05 NOTE — Discharge Instructions (Signed)
Your testing today has been reassuring and showed no signs of acute abnormalities.  I would like for you to take meloxicam once a day to help with pain, ER for severe worsening symptoms, [facility] or [facility] if you so choose within the next week for recheck if still having symptoms, ER for worsening symptoms

## 2022-07-05 NOTE — ED Triage Notes (Signed)
Pt with lower abdominal cramping and pain x1 week with radiatio to right back.  Pt also has had nausea and some occasional dry heaves and vomiting.  Pt describes this pain to feel like labor type pain. Pt has IUD in place and does not get a period but she took a home pregnancy test 06/19/2022 and it was positive.  Pt reports that she had vaginal bleeding from 2/16 until 2/17.  Pt is nauseated and dry heaving in triage.

## 2022-07-05 NOTE — ED Notes (Signed)
MSE was done

## 2022-07-05 NOTE — ED Notes (Signed)
Pt continues to dry heave, small amounts of emesis are coming up.  Gave warm blankets and medicated for nausea, in recliner

## 2022-07-07 LAB — URINE CULTURE

## 2022-08-25 ENCOUNTER — Emergency Department (HOSPITAL_COMMUNITY): Payer: Medicaid Other

## 2022-08-25 ENCOUNTER — Encounter (HOSPITAL_COMMUNITY): Payer: Self-pay | Admitting: Emergency Medicine

## 2022-08-25 ENCOUNTER — Other Ambulatory Visit: Payer: Self-pay

## 2022-08-25 ENCOUNTER — Emergency Department (HOSPITAL_COMMUNITY)
Admission: EM | Admit: 2022-08-25 | Discharge: 2022-08-25 | Disposition: A | Discharge status: Home / Self Care | Payer: Medicaid Other | Attending: Emergency Medicine | Admitting: Emergency Medicine

## 2022-08-25 DIAGNOSIS — R110 Nausea: Secondary | ICD-10-CM | POA: Diagnosis not present

## 2022-08-25 DIAGNOSIS — R051 Acute cough: Secondary | ICD-10-CM | POA: Diagnosis not present

## 2022-08-25 DIAGNOSIS — D696 Thrombocytopenia, unspecified: Secondary | ICD-10-CM | POA: Insufficient documentation

## 2022-08-25 DIAGNOSIS — R55 Syncope and collapse: Secondary | ICD-10-CM

## 2022-08-25 DIAGNOSIS — E860 Dehydration: Secondary | ICD-10-CM

## 2022-08-25 DIAGNOSIS — Z1152 Encounter for screening for COVID-19: Secondary | ICD-10-CM | POA: Insufficient documentation

## 2022-08-25 LAB — CBC WITH DIFFERENTIAL/PLATELET
Abs Immature Granulocytes: 0.04 10*3/uL (ref 0.00–0.07)
Basophils Absolute: 0.1 10*3/uL (ref 0.0–0.1)
Basophils Relative: 1 %
Eosinophils Absolute: 0.1 10*3/uL (ref 0.0–0.5)
Eosinophils Relative: 2 %
HCT: 39.5 % (ref 36.0–46.0)
Hemoglobin: 13.7 g/dL (ref 12.0–15.0)
Immature Granulocytes: 1 %
Lymphocytes Relative: 32 %
Lymphs Abs: 2 10*3/uL (ref 0.7–4.0)
MCH: 30.5 pg (ref 26.0–34.0)
MCHC: 34.7 g/dL (ref 30.0–36.0)
MCV: 88 fL (ref 80.0–100.0)
Monocytes Absolute: 0.5 10*3/uL (ref 0.1–1.0)
Monocytes Relative: 8 %
Neutro Abs: 3.5 10*3/uL (ref 1.7–7.7)
Neutrophils Relative %: 56 %
Platelets: 109 10*3/uL — ABNORMAL LOW (ref 150–400)
RBC: 4.49 MIL/uL (ref 3.87–5.11)
RDW: 13.2 % (ref 11.5–15.5)
WBC: 6.3 10*3/uL (ref 4.0–10.5)
nRBC: 0 % (ref 0.0–0.2)

## 2022-08-25 LAB — COMPREHENSIVE METABOLIC PANEL
ALT: 59 U/L — ABNORMAL HIGH (ref 0–44)
AST: 49 U/L — ABNORMAL HIGH (ref 15–41)
Albumin: 4.1 g/dL (ref 3.5–5.0)
Alkaline Phosphatase: 132 U/L — ABNORMAL HIGH (ref 38–126)
Anion gap: 9 (ref 5–15)
BUN: 7 mg/dL (ref 6–20)
CO2: 23 mmol/L (ref 22–32)
Calcium: 9.1 mg/dL (ref 8.9–10.3)
Chloride: 101 mmol/L (ref 98–111)
Creatinine, Ser: 0.75 mg/dL (ref 0.44–1.00)
GFR, Estimated: >60 mL/min (ref >60)
Glucose, Bld: 98 mg/dL (ref 70–99)
Potassium: 3.5 mmol/L (ref 3.5–5.1)
Sodium: 133 mmol/L — ABNORMAL LOW (ref 135–145)
Total Bilirubin: 1.3 mg/dL — ABNORMAL HIGH (ref 0.3–1.2)
Total Protein: 7.6 g/dL (ref 6.5–8.1)

## 2022-08-25 LAB — SARS CORONAVIRUS 2 BY RT PCR: SARS Coronavirus 2 by RT PCR: NEGATIVE

## 2022-08-25 LAB — PREGNANCY, URINE: Preg Test, Ur: NEGATIVE

## 2022-08-25 LAB — LIPASE, BLOOD: Lipase: 31 U/L (ref 11–51)

## 2022-08-25 LAB — TROPONIN I (HIGH SENSITIVITY)
Troponin I (High Sensitivity): 2 ng/L (ref <18)
Troponin I (High Sensitivity): 2 ng/L (ref <18)

## 2022-08-25 LAB — POC URINE PREG, ED: Preg Test, Ur: NEGATIVE

## 2022-08-25 LAB — CBG MONITORING, ED: Glucose-Capillary: 92 mg/dL (ref 70–99)

## 2022-08-25 MED ORDER — ONDANSETRON 4 MG PO TBDP: 4.0000 mg | ORAL_TABLET | Freq: Three times a day (TID) | ORAL | 0 refills | Status: DC | PRN

## 2022-08-25 MED ORDER — SODIUM CHLORIDE 0.9 % IV BOLUS
1000.0000 mL | Freq: Once | INTRAVENOUS | Status: AC
  Administered 2022-08-25: 1000 mL via INTRAVENOUS

## 2022-08-25 MED ORDER — FENTANYL CITRATE PF 50 MCG/ML IJ SOSY
50.0000 ug | PREFILLED_SYRINGE | Freq: Once | INTRAMUSCULAR | Status: AC
  Administered 2022-08-25: 50 ug via INTRAVENOUS
  Filled 2022-08-25: qty 1

## 2022-08-25 MED ORDER — ONDANSETRON 4 MG PO TBDP
4.0000 mg | ORAL_TABLET | Freq: Once | ORAL | Status: AC
  Administered 2022-08-25: 4 mg via ORAL
  Filled 2022-08-25: qty 1

## 2022-08-25 MED ORDER — PROCHLORPERAZINE EDISYLATE 10 MG/2ML IJ SOLN
5.0000 mg | Freq: Once | INTRAMUSCULAR | Status: AC
  Administered 2022-08-25: 5 mg via INTRAVENOUS
  Filled 2022-08-25: qty 2

## 2022-08-25 MED ORDER — ALBUTEROL SULFATE (2.5 MG/3ML) 0.083% IN NEBU
2.5000 mg | INHALATION_SOLUTION | Freq: Once | RESPIRATORY_TRACT | Status: AC
  Administered 2022-08-25: 2.5 mg via RESPIRATORY_TRACT
  Filled 2022-08-25: qty 3

## 2022-08-25 NOTE — ED Triage Notes (Signed)
Pt in via [facility], pt had a syncopal episode today while playing her child, pt denies hitting head, reports falling backwards, pt told EMS that she has not ate in 2 wks with the pts family reporting she ate last night, pt A&O x4, vitals WNL, cbg 117, A&O x4

## 2022-08-25 NOTE — ED Notes (Signed)
ED Provider at bedside. 

## 2022-08-25 NOTE — ED Provider Notes (Signed)
[place] EMERGENCY DEPARTMENT AT [facility] Provider Note   CSN: [id number] Arrival date & time: 08/25/22  1444     History {Add pertinent medical, surgical, social history, OB history to HPI:1} Chief Complaint  Patient presents with   Loss of Consciousness    NIMSI MALES is a 27 y.o. female who presents for loss of consciousness.  Patient states that for the past 2 weeks she has been really nauseated and has poor appetite.  Over the past 4 days she has had a really bad cough with posttussive gagging and emesis.  She does vape but she has cut back.  She is undergoing stressful marital situation and is unsure if that is causing her to feel nauseous or if she could be sick or even pregnant.  Patient was at school picking up her kids when she began feeling extremely woozy and passed out.  She was surrounded by several people.  She did not hit her head.   Loss of Consciousness      Home Medications Prior to Admission medications   Medication Sig Start Date End Date Taking? Authorizing Provider  albuterol (PROVENTIL) (2.5 MG/3ML) 0.083% nebulizer solution Take 2.5 mg by nebulization See admin instructions. 1 vial via nebulizer every 6 to 8 hours as needed for shortness of breath 01/29/20   [provider]  albuterol (VENTOLIN HFA) 108 (90 Base) MCG/ACT inhaler Inhale 1-2 puffs into the lungs every 6 (six) hours as needed for wheezing or shortness of breath. 03/13/21   Particia Nearing, PA-C  cloNIDine (CATAPRES) 0.2 MG tablet Take 0.2 mg by mouth at bedtime. 07/20/21   [provider]  escitalopram (LEXAPRO) 10 MG tablet Take 20 mg by mouth at bedtime. 07/20/21   [provider]  ibuprofen (ADVIL) 800 MG tablet Take 1 tablet (800 mg total) by mouth every 6 (six) hours as needed for moderate pain. 11/16/21   Gilda Crease, MD  levonorgestrel (MIRENA) 20 MCG/DAY IUD by Intrauterine route.    [provider]  lidocaine (XYLOCAINE) 2  % solution Use as directed 15 mLs in the mouth or throat as needed for mouth pain. 12/20/21   Theron Arista, PA-C  nitrofurantoin, macrocrystal-monohydrate, (MACROBID) 100 MG capsule Take 1 capsule (100 mg total) by mouth 2 (two) times daily. 03/20/22   Kommor, Madison, MD  ondansetron (ZOFRAN-ODT) 4 MG disintegrating tablet Take 1 tablet (4 mg total) by mouth every 8 (eight) hours as needed for nausea or vomiting. 03/20/22   Kommor, Madison, MD  Oxcarbazepine (TRILEPTAL) 300 MG tablet Take 600 mg by mouth 2 (two) times daily. 07/20/21   [provider]  VYVANSE 50 MG capsule Take 50 mg by mouth every morning. 10/20/21   [provider]      Allergies    Dilaudid [hydromorphone], Keflex [cephalexin], Other, and Adhesive [tape]    Review of Systems   Review of Systems  Cardiovascular:  Positive for syncope.    Physical Exam Updated Vital Signs BP 101/61   Pulse (!) 101   Temp 98 F (36.7 C)   Resp 18   Ht  (1.499 m)   Wt 86.6 kg   SpO2 97%   BMI 38.58 kg/m  Physical Exam Vitals and nursing note reviewed.  Constitutional:      General: She is not in acute distress.    Appearance: She is well-developed. She is ill-appearing. She is not toxic-appearing or diaphoretic.  HENT:     Head: Normocephalic and  atraumatic.     Right Ear: External ear normal.     Left Ear: External ear normal.     Nose: Nose normal.     Mouth/Throat:     Mouth: Mucous membranes are dry.  Eyes:     General: No scleral icterus.    Extraocular Movements: Extraocular movements intact.     Conjunctiva/sclera: Conjunctivae normal.     Pupils: Pupils are equal, round, and reactive to light.  Cardiovascular:     Rate and Rhythm: Normal rate and regular rhythm.     Heart sounds: Normal heart sounds. No murmur heard.    No friction rub. No gallop.  Pulmonary:     Effort: Pulmonary effort is normal. No respiratory distress.     Breath sounds: Wheezing and rhonchi present.  Abdominal:      General: Bowel sounds are normal. There is no distension.     Palpations: Abdomen is soft. There is no mass.     Tenderness: There is no abdominal tenderness. There is no guarding.  Musculoskeletal:     Cervical back: Normal range of motion.  Skin:    General: Skin is warm and dry.  Neurological:     Mental Status: She is alert and oriented to person, place, and time.  Psychiatric:        Behavior: Behavior normal.     ED Results / Procedures / Treatments   Labs (all labs ordered are listed, but only abnormal results are displayed) Labs Reviewed  SARS CORONAVIRUS 2 BY RT PCR  CBC WITH DIFFERENTIAL/PLATELET  COMPREHENSIVE METABOLIC PANEL  PREGNANCY, URINE  LIPASE, BLOOD  POC URINE PREG, ED  CBG MONITORING, ED  TROPONIN I (HIGH SENSITIVITY)    EKG None  Radiology DG Chest 2 View  Result Date: 08/25/2022 CLINICAL DATA:  Cough EXAM: CHEST - 2 VIEW COMPARISON:  01/02/2022 FINDINGS: The heart size and mediastinal contours are within normal limits. Both lungs are clear. The visualized skeletal structures are unremarkable. IMPRESSION: Normal study. Electronically Signed   By: Charlett Nose M.D.   On: 08/25/2022 15:54    Procedures Procedures  {Document cardiac monitor, telemetry assessment procedure when appropriate:1}  Medications Ordered in ED Medications  ondansetron (ZOFRAN-ODT) disintegrating tablet 4 mg (4 mg Oral Given 08/25/22 1559)  sodium chloride 0.9 % bolus 1,000 mL (1,000 mLs Intravenous New Bag/Given 08/25/22 1558)  albuterol (PROVENTIL) (2.5 MG/3ML) 0.083% nebulizer solution 2.5 mg (2.5 mg Nebulization Given 08/25/22 1559)    ED Course/ Medical Decision Making/ A&P   {   Click here for ABCD2, HEART and other calculatorsREFRESH Note before signing :1}                          Medical Decision Making Amount and/or Complexity of Data Reviewed Labs: ordered. Radiology: ordered. ECG/medicine tests: ordered.  Risk Prescription drug  management.   ***  {Document critical care time when appropriate:1} {Document review of labs and clinical decision tools ie heart score, Chads2Vasc2 etc:1}  {Document your independent review of radiology images, and any outside records:1} {Document your discussion with family members, caretakers, and with consultants:1} {Document social determinants of health affecting pt's care:1} {Document your decision making why or why not admission, treatments were needed:1} Final Clinical Impression(s) / ED Diagnoses Final diagnoses:  None    Rx / DC Orders ED Discharge Orders     None

## 2022-08-25 NOTE — ED Notes (Signed)
US at bedside

## 2022-08-25 NOTE — ED Notes (Signed)
Pt taken to Xray. Pt able to ambulate well to restroom without asking for assistance before.

## 2022-08-25 NOTE — Discharge Instructions (Signed)
Get help right away if: You faint. You hit your head or are injured after fainting. You have any of these symptoms that may indicate trouble with your heart: Fast or irregular heartbeats (palpitations). Unusual pain in your chest, abdomen, or back. Shortness of breath. You have a seizure. You have a severe headache. You are confused. You have vision problems. You have severe weakness or trouble walking. You are bleeding from your mouth or rectum, or you have black or tarry stool. These symptoms may represent a serious problem that is an emergency. Do not wait to see if your symptoms will go away. Get medical help right away. Call your local emergency services (911 in the [place] Do not drive yourself to the hospital. 

## 2022-09-01 ENCOUNTER — Ambulatory Visit: Payer: Medicaid Other | Admitting: Internal Medicine

## 2022-10-20 ENCOUNTER — Other Ambulatory Visit: Payer: Self-pay

## 2022-10-20 ENCOUNTER — Other Ambulatory Visit: Payer: Self-pay | Admitting: Internal Medicine

## 2022-10-20 ENCOUNTER — Ambulatory Visit: Payer: Medicaid Other | Attending: Internal Medicine | Admitting: Internal Medicine

## 2022-10-20 ENCOUNTER — Ambulatory Visit: Payer: Medicaid Other | Admitting: Internal Medicine

## 2022-10-20 ENCOUNTER — Telehealth: Payer: Self-pay | Admitting: *Deleted

## 2022-10-20 ENCOUNTER — Encounter: Payer: Self-pay | Admitting: Internal Medicine

## 2022-10-20 ENCOUNTER — Ambulatory Visit: Payer: Medicaid Other | Attending: Internal Medicine

## 2022-10-20 VITALS — Ht 59.0 in | Wt 179.0 lb

## 2022-10-20 DIAGNOSIS — R55 Syncope and collapse: Secondary | ICD-10-CM

## 2022-10-20 DIAGNOSIS — R002 Palpitations: Secondary | ICD-10-CM | POA: Insufficient documentation

## 2022-10-20 DIAGNOSIS — R079 Chest pain, unspecified: Secondary | ICD-10-CM | POA: Diagnosis not present

## 2022-10-20 NOTE — Progress Notes (Signed)
Cardiology Office Note  Date: 10/20/2022   ID: Stacey Cook, DOB Mar 08, 1996, MRN [id number]  PCP:  Lovey Newcomer, PA  Cardiologist:  None Electrophysiologist:  None   Reason for Office Visit: Evaluation of syncope at the request of Tiburcio Pea, PA-C   History of Present Illness: Stacey Cook is a 27 y.o. female known to have asthma was referred to cardiology clinic for evaluation of syncope.  Patient has syncopal event in 08/2022 when she went to the school to pick up her kids.  She had a prodrome of dizziness and palpitations followed by syncopal event after she walked down to her car. The syncopal event was unwitnessed but the bystander noted that she was down for approximately 3 minutes. She did not have any prior syncopal episode and no recurrent syncopal episodes since 08/2022.  She also started to have substernal chest pain radiating to her left arm x 3 weeks associated with palpitations. These palpitations last for a few seconds and happens mostly at night.  No other symptoms of DOE, leg swelling.  EKG from 4/24 reviewed and showed NSR, no ischemia, borderline prolonged QT interval.  EKG from today in the clinic visit showed NSR and normal QTc interval.  No family history of sudden cardiac death.  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 Lovell Sheehan, pt will most likely go home and he will tell her to arrive at 10:45 - already has labs   CHOLESTEATOMA EXCISION     COCHLEAR IMPLANT Right    COCHLEAR IMPLANT REMOVAL Right 05/2020   DILATION AND EVACUATION N/A 09/11/2016   Procedure: DILATATION AND CURRATAGE  2ND TRIMESTER;  Surgeon: Tilda Burrow, MD;  Location: [facility] ORS;  Service: Gynecology;  Laterality: N/A;   IMPLANTATION BONE ANCHORED HEARING AID Right 04/2013   INNER EAR SURGERY Right 03/31/2018   MIDDLE EAR SURGERY     28 surgeries for cholesteatoma   TYMPANOPLASTY Left    TYMPANOSTOMY      Current Outpatient Medications  Medication Sig Dispense Refill   albuterol (PROVENTIL) (2.5 MG/3ML) 0.083% nebulizer solution Take 2.5 mg by nebulization See admin instructions. 1 vial via nebulizer every 6 to 8 hours as needed for shortness of breath  albuterol (VENTOLIN HFA) 108 (90 Base) MCG/ACT inhaler Inhale 1-2 puffs into the lungs every 6 (six) hours as needed for wheezing or shortness of breath. 18 g 0   cloNIDine (CATAPRES) 0.2 MG tablet Take 0.2 mg by mouth at bedtime. (Patient not taking: Reported on 10/20/2022)     escitalopram (LEXAPRO) 10 MG tablet Take 20 mg by mouth at bedtime. (Patient not taking: Reported on 10/20/2022)     ibuprofen (ADVIL) 800 MG tablet Take 1 tablet (800 mg total) by mouth every 6 (six) hours as needed for moderate pain. (Patient not taking: Reported on 10/20/2022) 20 tablet 0   levonorgestrel (MIRENA) 20 MCG/DAY IUD by Intrauterine route. (Patient not taking: Reported on 10/20/2022)     lidocaine (XYLOCAINE) 2 % solution Use as directed 15 mLs in the  mouth or throat as needed for mouth pain. (Patient not taking: Reported on 10/20/2022) 100 mL 0   nitrofurantoin, macrocrystal-monohydrate, (MACROBID) 100 MG capsule Take 1 capsule (100 mg total) by mouth 2 (two) times daily. (Patient not taking: Reported on 10/20/2022) 10 capsule 0   ondansetron (ZOFRAN-ODT) 4 MG disintegrating tablet Take 1 tablet (4 mg total) by mouth every 8 (eight) hours as needed for nausea. (Patient not taking: Reported on 10/20/2022) 6 tablet 0   Oxcarbazepine (TRILEPTAL) 300 MG tablet Take 600 mg by mouth 2 (two) times daily. (Patient not taking: Reported on 10/20/2022)     VYVANSE 50 MG capsule Take 50 mg by mouth every morning. (Patient not taking: Reported on 10/20/2022)     No current facility-administered medications for this visit.   Allergies:  Dilaudid [hydromorphone], Keflex [cephalexin], Other, and Adhesive [tape]   Social History: The patient  reports that she has been smoking cigarettes. She has a 0.50 pack-year smoking history. She has never used smokeless tobacco. She reports current alcohol use. She reports that she does not use drugs.   Family History: The patient's family history includes Asthma in her brother and maternal grandmother; Cancer in her maternal grandmother and another family member; Cholelithiasis in her mother; Depression in her mother; Diabetes in her maternal grandmother; Hyperlipidemia in her maternal grandmother; Hypertension in her maternal grandmother; Kidney disease in her mother; Scoliosis in her daughter; Seizures in her maternal grandmother and maternal uncle; Stroke in her maternal grandmother; Thyroid disease in her maternal grandmother.   ROS:  Please see the history of present illness. Otherwise, complete review of systems is positive for none.  All other systems are reviewed and negative.   Physical Exam: VS:  Ht 4\' 11"  (1.499 m)   Wt 179 lb (81.2 kg)   SpO2 96%   BMI 36.15 kg/m , BMI Body mass index is 36.15 kg/m.  Wt  Readings from Last 3 Encounters:  10/20/22 179 lb (81.2 kg)  08/25/22 191 lb (86.6 kg)  03/19/22 187 lb 6.3 oz (85 kg)    General: Patient appears comfortable at rest. HEENT: Conjunctiva and lids normal, oropharynx clear with moist mucosa. Neck: Supple, no elevated JVP or carotid bruits, no thyromegaly. Lungs: Clear to auscultation, nonlabored breathing at rest. Cardiac: Regular rate and rhythm, no S3 or significant systolic murmur, no pericardial rub. Abdomen: Soft, nontender, no hepatomegaly, bowel sounds present, no guarding or rebound. Extremities: No pitting edema, distal pulses 2+. Skin: Warm and dry. Musculoskeletal: No kyphosis. Neuropsychiatric: Alert and oriented x3, affect grossly appropriate.  Recent Labwork: 08/25/2022: ALT 59; AST 49; BUN 7; Creatinine, Ser 0.75; Hemoglobin 13.7; Platelets 109; Potassium 3.5; Sodium 133  Component Value Date/Time   TRIG 163 (H) 01/12/2018 0210    Assessment and Plan:  Patient is a 27 year old F known to have asthma was referred to cardiology clinic for evaluation of syncope.  # Syncope -Patient had unwitnessed syncopal episode in 08/2022 for approximately 3 minutes preceded by dizziness and palpitations.  EKG in 4/24 showed NSR and borderline prolonged QTc interval 484 ms. Repeat EKG today in the clinic showed NSR and normal QTc interval.  Orthostatic vitals today in the clinic were negative for orthostatic hypotension and POTS.  No history of sudden cardiac death in the family.  Obtain 2-week event monitor and treadmill exercise EKG to rule out any malignant arrhythmias.  # Chest pain # Palpitations -Patient also has been having substernal chest pain radiating to the left arm x 3 weeks associated with palpitations.  Will obtain treadmill EKG and 2-week event monitor.  I have spent a total of 45 minutes with patient reviewing chart, EKGs, labs and examining patient as well as establishing an assessment and plan that was discussed with  the patient.  > 50% of time was spent in direct patient care.    Medication Adjustments/Labs and Tests Ordered: Current medicines are reviewed at length with the patient today.  Concerns regarding medicines are outlined above.   Tests Ordered: Orders Placed This Encounter  Procedures   Exercise Tolerance Test   EKG 12-Lead    Medication Changes: No orders of the defined types were placed in this encounter.   Disposition:  Follow up  6 months  Signed, Lannie Heaps Verne Spurr, MD, 10/20/2022 3:56 PM    [facility] at [facility] [address] [place], [place] [address]

## 2022-10-20 NOTE — Telephone Encounter (Signed)
Called patient to reschedule today's appt to 1pm or 3pm today with Dr. Cathlean Sauer, no answer left msg to call back.

## 2022-10-20 NOTE — Patient Instructions (Addendum)
Medication Instructions:  Your physician recommends that you continue on your current medications as directed. Please refer to the Current Medication list given to you today.   Labwork: None  Testing/Procedures: Your physician has requested that you have an exercise tolerance test. For further information please visit [email or web address] Please also follow instruction sheet, as given.  Your physician has recommended that you wear a Zio monitor.   This monitor is a medical device that records the heart's electrical activity. Doctors most often use these monitors to diagnose arrhythmias. Arrhythmias are problems with the speed or rhythm of the heartbeat. The monitor is a small device applied to your chest. You can wear one while you do your normal daily activities. While wearing this monitor if you have any symptoms to push the button and record what you felt. Once you have worn this monitor for the period of time provider prescribed (for 14 days), you will return the monitor device in the postage paid box. Once it is returned they will download the data collected and provide [name] with a report which the provider will then review and we will call you with those results. Important tips:  Avoid showering during the first 48 hours of wearing the monitor. Avoid excessive sweating to help maximize wear time. Do not submerge the device, no hot tubs, and no swimming pools. Keep any lotions or oils away from the patch. After 24 hours you may shower with the patch on. Take brief showers with your back facing the shower head.  Do not remove patch once it has been placed because that will interrupt data and decrease adhesive wear time. Push the button when you have any symptoms and write down what you were feeling. Once you have completed wearing your monitor, remove and place into box which has postage paid and place in your outgoing mailbox.  If for some reason you have misplaced your box then call our  office and we can provide another box and/or mail it off for you.   Follow-Up: Your physician recommends that you schedule a follow-up appointment in: 6 months  Any Other Special Instructions Will Be Listed Below (If Applicable).  If you need a refill on your cardiac medications before your next appointment, please call your pharmacy.

## 2022-10-21 ENCOUNTER — Telehealth: Payer: Self-pay | Admitting: Internal Medicine

## 2022-10-21 NOTE — Telephone Encounter (Signed)
Pt would like a callback regarding getting help with applying the Zio monitor being that she can not get it to stay on. Please advise.

## 2022-10-21 NOTE — Telephone Encounter (Signed)
Spoke with pt who states that her Zio monitor will not stay in place. Pt states that she has called the helpline who did not offer to replace monitor. I have sent an email to [facility] with [employer].

## 2022-10-21 NOTE — Telephone Encounter (Signed)
Spoke with pt who states that monitor will not stick at all now. She will leave monitor off until tomorrow when we talk to [facility].

## 2022-10-21 NOTE — Telephone Encounter (Signed)
Pt calling to ask if she should try to put the zio monitor back on or not? I told her I will have the nurse give her a call back.

## 2022-10-22 NOTE — Telephone Encounter (Signed)
Received an email for [facility] stating that a new monitor has been shipped to the pt. Pt informed and voiced understanding.

## 2022-10-25 DIAGNOSIS — R55 Syncope and collapse: Secondary | ICD-10-CM | POA: Diagnosis not present

## 2022-10-25 NOTE — Progress Notes (Signed)
Order(s) created erroneously. Erroneous order ID: [id number]  Order moved by: Leonia Corona  Order move date/time: 10/25/2022 5:15 PM  Source Patient: [id number]  Source Contact: 10/20/2022  Destination Patient: [id number]  Destination Contact: 10/20/2022

## 2022-10-28 ENCOUNTER — Ambulatory Visit (HOSPITAL_COMMUNITY)
Admission: RE | Admit: 2022-10-28 | Discharge: 2022-10-28 | Disposition: A | Discharge status: Home / Self Care | Payer: Medicaid Other | Source: Ambulatory Visit | Attending: Physician Assistant | Admitting: Physician Assistant

## 2022-10-28 DIAGNOSIS — R079 Chest pain, unspecified: Secondary | ICD-10-CM | POA: Diagnosis present

## 2022-10-28 LAB — EXERCISE TOLERANCE TEST
Angina Index: 1
Duke Treadmill Score: 1
Estimated workload: 7
Exercise duration (min): 4 min
Exercise duration (sec): 31 s
MPHR: 196 {beats}/min
Peak HR: 151 {beats}/min
Percent HR: 77 %
RPE: 16
Rest HR: 72 {beats}/min
ST Depression (mm): 0 mm

## 2022-11-25 ENCOUNTER — Other Ambulatory Visit (HOSPITAL_BASED_OUTPATIENT_CLINIC_OR_DEPARTMENT_OTHER): Payer: Self-pay

## 2022-11-25 DIAGNOSIS — R5383 Other fatigue: Secondary | ICD-10-CM

## 2022-11-26 ENCOUNTER — Encounter: Payer: Self-pay | Admitting: Adult Health

## 2022-11-26 ENCOUNTER — Other Ambulatory Visit (HOSPITAL_COMMUNITY)
Admission: RE | Admit: 2022-11-26 | Discharge: 2022-11-26 | Disposition: A | Discharge status: Home / Self Care | Payer: Medicaid Other | Source: Ambulatory Visit | Attending: Adult Health | Admitting: Adult Health

## 2022-11-26 ENCOUNTER — Ambulatory Visit (INDEPENDENT_AMBULATORY_CARE_PROVIDER_SITE_OTHER): Payer: Medicaid Other | Admitting: Adult Health

## 2022-11-26 VITALS — BP 124/67 | HR 83 | Ht 59.0 in | Wt 184.0 lb

## 2022-11-26 DIAGNOSIS — Z0000 Encounter for general adult medical examination without abnormal findings: Secondary | ICD-10-CM | POA: Insufficient documentation

## 2022-11-26 DIAGNOSIS — Z13220 Encounter for screening for lipoid disorders: Secondary | ICD-10-CM

## 2022-11-26 DIAGNOSIS — F32A Depression, unspecified: Secondary | ICD-10-CM

## 2022-11-26 DIAGNOSIS — N941 Unspecified dyspareunia: Secondary | ICD-10-CM | POA: Diagnosis not present

## 2022-11-26 DIAGNOSIS — F419 Anxiety disorder, unspecified: Secondary | ICD-10-CM

## 2022-11-26 DIAGNOSIS — N730 Acute parametritis and pelvic cellulitis: Secondary | ICD-10-CM

## 2022-11-26 DIAGNOSIS — Z30015 Encounter for initial prescription of vaginal ring hormonal contraceptive: Secondary | ICD-10-CM

## 2022-11-26 DIAGNOSIS — R350 Frequency of micturition: Secondary | ICD-10-CM

## 2022-11-26 DIAGNOSIS — R102 Pelvic and perineal pain: Secondary | ICD-10-CM

## 2022-11-26 DIAGNOSIS — Z3202 Encounter for pregnancy test, result negative: Secondary | ICD-10-CM

## 2022-11-26 LAB — POCT URINALYSIS DIPSTICK
Blood, UA: NEGATIVE
Glucose, UA: NEGATIVE
Ketones, UA: NEGATIVE
Nitrite, UA: NEGATIVE
Protein, UA: NEGATIVE

## 2022-11-26 LAB — POCT URINE PREGNANCY: Preg Test, Ur: NEGATIVE

## 2022-11-26 MED ORDER — METRONIDAZOLE 500 MG PO TABS: 500.0000 mg | ORAL_TABLET | Freq: Two times a day (BID) | ORAL | 0 refills | Status: DC

## 2022-11-26 MED ORDER — CEFTRIAXONE SODIUM 1 G IJ SOLR
1.0000 g | Freq: Once | INTRAMUSCULAR | Status: AC
  Administered 2022-11-26: 1 g via INTRAMUSCULAR

## 2022-11-26 MED ORDER — ETONOGESTREL-ETHINYL ESTRADIOL 0.12-0.015 MG/24HR VA RING: VAGINAL_RING | VAGINAL | 12 refills | Status: DC

## 2022-11-26 MED ORDER — DOXYCYCLINE HYCLATE 100 MG PO TABS: 100.0000 mg | ORAL_TABLET | Freq: Two times a day (BID) | ORAL | 0 refills | Status: DC

## 2022-11-26 NOTE — Progress Notes (Signed)
Patient ID: Stacey Cook, female   DOB: Nov 05, 1995, 27 y.o.   MRN: [id number] History of Present Illness:  Stacey Cook  is a 27 year old white female,married, [id number] in for a well woman gyn exam and pap. She is complaining of pelvic pain like when she had PID and pain with sex and urinary frequency and vaginal dryness, and wants to get back on birth control. She says IUD fell out 07/10/22.  PCP is Daria Pastures PA.  Current Medications, Allergies, Past Medical History, Past Surgical History, Family History and Social History were reviewed in [employer] record.     Review of Systems: Patient denies any headaches, hearing loss, fatigue, blurred vision, shortness of breath, chest pain, problems with bowel movements.No joint pain or mood swings.  See HPI for positives    Physical Exam:BP 124/67 (BP Location: Left Arm, Patient Position: Sitting, Cuff Size: Normal)   Pulse 83   Ht 4\' 11"  (1.499 m)   Wt 184 lb (83.5 kg)   LMP 11/12/2022   BMI 37.16 kg/m  urine dipstick +leuks. UPT is negative General:  Well developed, well nourished, no acute distress Skin:  Warm and dry Neck:  Midline trachea, normal thyroid, good ROM, no lymphadenopathy Lungs; Clear to auscultation bilaterally Breast:  No dominant palpable mass, retraction, or nipple discharge Cardiovascular: Regular rate and rhythm Abdomen:  Soft, non tender, no hepatosplenomegaly Pelvic:  External genitalia is normal in appearance, no lesions.  The vagina has white discharge. Urethra has no lesions or masses. The cervix is bulbous,pap with GC/CHL and HR HPV genotyping performed.  Uterus is felt to be normal size, shape, and contour, +tender.  No adnexal masses or tenderness noted.Bladder is tender, no masses felt. Extremities/musculoskeletal:  No swelling or varicosities noted, no clubbing or cyanosis Psych:  No mood changes, alert and cooperative,seems happy AA is 2 Fall risk is low    11/26/2022   10:11 AM 05/15/2020    10:30 AM 11/28/2019    9:56 AM  Depression screen PHQ 2/9  Decreased Interest 1 2 2   Down, Depressed, Hopeless 2 3 1   PHQ - 2 Score 3 5 3   Altered sleeping 3 1 3   Tired, decreased energy 3 1 3   Change in appetite 3 2 1   Feeling bad or failure about yourself  2 3 1   Trouble concentrating 3 1 2   Moving slowly or fidgety/restless 1 1 0  Suicidal thoughts 0 3 0  PHQ-9 Score 18 17 13   Difficult doing work/chores   Somewhat difficult       11/26/2022   10:12 AM 05/15/2020   10:33 AM 11/28/2019    9:56 AM 08/23/2019   11:36 AM  GAD 7 : Generalized Anxiety Score  Nervous, Anxious, on Edge 2 1 3 1   Control/stop worrying 3 1 1 2   Worry too much - different things 3 3 1 2   Trouble relaxing 3 1 2 3   Restless 0 0 2 1  Easily annoyed or irritable 3 3 3 2   Afraid - awful might happen 0 3 0 2  Total GAD 7 Score 14 12 12 13   Anxiety Difficulty   Very difficult Somewhat difficult      Upstream - 11/26/22 1024       Pregnancy Intention Screening   Does the patient want to become pregnant in the next year? No    Does the patient's partner want to become pregnant in the next year? No    Would the patient  like to discuss contraceptive options today? Yes      Contraception Wrap Up   Current Method No Method - Other Reason    Reason for No Current Contraceptive Method at Intake ([facility] Only) Other    End Method Vaginal Ring            Examination chaperoned by Malachy Mood LPM  Impression and Plan:  1. Routine general medical examination at a health care facility Pap sent Pap in 3 years if normal Physical in 1 year  Will check labs - Cytology - PAP( [place]) - CBC - Comprehensive metabolic panel - Lipid panel  2. Pregnancy examination or test, negative result  - POCT urine pregnancy  3. Dyspareunia, female  4. Pelvic pain +pain  for for a while now, like when had PID  5. PID (acute pelvic inflammatory disease) Will give rocephin 1 gm IM in office and rx flagyl and  doxycycline   Meds ordered this encounter  Medications   doxycycline (VIBRA-TABS) 100 MG tablet    Sig: Take 1 tablet (100 mg total) by mouth 2 (two) times daily.    Dispense:  28 tablet    Refill:  0    Order Specific Question:   Supervising Provider    Answer:   Duane Lope H [[id number]]   metroNIDAZOLE (FLAGYL) 500 MG tablet    Sig: Take 1 tablet (500 mg total) by mouth 2 (two) times daily.    Dispense:  28 tablet    Refill:  0    Order Specific Question:   Supervising Provider    Answer:   Lazaro Arms [[id number]]   etonogestrel-ethinyl estradiol (NUVARING) 0.12-0.015 MG/24HR vaginal ring    Sig: Insert vaginally and leave in place for 3 consecutive weeks, then remove for 1 week.    Dispense:  1 each    Refill:  12    Order Specific Question:   Supervising Provider    Answer:   Duane Lope H [[id number]]   cefTRIAXone (ROCEPHIN) injection 1 g    Order Specific Question:   Antibiotic Indication:    Answer:   Other Indication (list below)    Order Specific Question:   Other Indication:    Answer:   PID   Will recheck in 1 week   6. Anxiety and depression She declines meds seeing therapist in September Denies any SI or HI  7. Screening cholesterol level - Lipid panel  8. Urinary frequency UA C&S sent to rule out UTI - POCT Urinalysis Dipstick   9. Contraceptive management She wants nuva ring, has used in past  Will rx nuva ring, use condoms for 1 month

## 2022-11-27 LAB — URINALYSIS, ROUTINE W REFLEX MICROSCOPIC
Bilirubin, UA: NEGATIVE
Protein,UA: NEGATIVE
RBC, UA: NEGATIVE
Specific Gravity, UA: 1.021 (ref 1.005–1.030)
Urobilinogen, Ur: 0.2 mg/dL (ref 0.2–1.0)

## 2022-11-27 LAB — LIPID PANEL
Chol/HDL Ratio: 5.3 ratio — ABNORMAL HIGH (ref 0.0–4.4)
Cholesterol, Total: 168 mg/dL (ref 100–199)
HDL: 32 mg/dL — ABNORMAL LOW (ref >39)
LDL Chol Calc (NIH): 106 mg/dL — ABNORMAL HIGH (ref 0–99)
Triglycerides: 168 mg/dL — ABNORMAL HIGH (ref 0–149)
VLDL Cholesterol Cal: 30 mg/dL (ref 5–40)

## 2022-11-27 LAB — COMPREHENSIVE METABOLIC PANEL
ALT: 31 IU/L (ref 0–32)
AST: 23 IU/L (ref 0–40)
Albumin: 4.5 g/dL (ref 4.0–5.0)
Alkaline Phosphatase: 102 IU/L (ref 44–121)
BUN/Creatinine Ratio: 15 (ref 9–23)
BUN: 9 mg/dL (ref 6–20)
Bilirubin Total: 0.3 mg/dL (ref 0.0–1.2)
CO2: 24 mmol/L (ref 20–29)
Calcium: 9.4 mg/dL (ref 8.7–10.2)
Chloride: 103 mmol/L (ref 96–106)
Creatinine, Ser: 0.59 mg/dL (ref 0.57–1.00)
Globulin, Total: 2.5 g/dL (ref 1.5–4.5)
Glucose: 90 mg/dL (ref 70–99)
Potassium: 4.6 mmol/L (ref 3.5–5.2)
Sodium: 139 mmol/L (ref 134–144)
Total Protein: 7 g/dL (ref 6.0–8.5)
eGFR: 129 mL/min/{1.73_m2} (ref >59)

## 2022-11-27 LAB — CBC
Hematocrit: 42.8 % (ref 34.0–46.6)
Hemoglobin: 14.7 g/dL (ref 11.1–15.9)
MCH: 30.8 pg (ref 26.6–33.0)
MCHC: 34.3 g/dL (ref 31.5–35.7)
MCV: 90 fL (ref 79–97)
Platelets: 202 10*3/uL (ref 150–450)
RBC: 4.77 x10E6/uL (ref 3.77–5.28)
RDW: 12.7 % (ref 11.7–15.4)
WBC: 7.5 10*3/uL (ref 3.4–10.8)

## 2022-11-27 LAB — MICROSCOPIC EXAMINATION

## 2022-11-27 LAB — URINE CULTURE

## 2022-11-28 LAB — URINALYSIS, ROUTINE W REFLEX MICROSCOPIC
Glucose, UA: NEGATIVE
Ketones, UA: NEGATIVE
Nitrite, UA: NEGATIVE
pH, UA: 6 (ref 5.0–7.5)

## 2022-11-28 LAB — MICROSCOPIC EXAMINATION
Casts: NONE SEEN /lpf
Epithelial Cells (non renal): >10 /hpf — AB (ref 0–10)
RBC, Urine: NONE SEEN /HPF (ref 0–2)

## 2022-11-28 LAB — URINE CULTURE

## 2022-12-01 LAB — CYTOLOGY - PAP
Adequacy: ABSENT
Chlamydia: NEGATIVE
Comment: NEGATIVE
Comment: NEGATIVE
Comment: NORMAL
Diagnosis: NEGATIVE
High risk HPV: NEGATIVE
Neisseria Gonorrhea: NEGATIVE

## 2022-12-03 ENCOUNTER — Ambulatory Visit: Payer: Medicaid Other | Admitting: Adult Health

## 2022-12-13 ENCOUNTER — Emergency Department (HOSPITAL_COMMUNITY)
Admission: EM | Admit: 2022-12-13 | Discharge: 2022-12-13 | Disposition: A | Discharge status: Home / Self Care | Payer: Medicaid Other | Attending: Emergency Medicine | Admitting: Emergency Medicine

## 2022-12-13 ENCOUNTER — Encounter (HOSPITAL_COMMUNITY): Payer: Self-pay

## 2022-12-13 ENCOUNTER — Emergency Department (HOSPITAL_COMMUNITY): Payer: Medicaid Other

## 2022-12-13 ENCOUNTER — Other Ambulatory Visit: Payer: Self-pay

## 2022-12-13 DIAGNOSIS — W010XXA Fall on same level from slipping, tripping and stumbling without subsequent striking against object, initial encounter: Secondary | ICD-10-CM | POA: Insufficient documentation

## 2022-12-13 DIAGNOSIS — S59902A Unspecified injury of left elbow, initial encounter: Secondary | ICD-10-CM

## 2022-12-13 DIAGNOSIS — M25512 Pain in left shoulder: Secondary | ICD-10-CM

## 2022-12-13 DIAGNOSIS — S59901A Unspecified injury of right elbow, initial encounter: Secondary | ICD-10-CM | POA: Insufficient documentation

## 2022-12-13 DIAGNOSIS — M25511 Pain in right shoulder: Secondary | ICD-10-CM | POA: Diagnosis not present

## 2022-12-13 MED ORDER — IBUPROFEN 800 MG PO TABS
800.0000 mg | ORAL_TABLET | Freq: Once | ORAL | Status: AC
  Administered 2022-12-13: 800 mg via ORAL
  Filled 2022-12-13: qty 1

## 2022-12-13 MED ORDER — IBUPROFEN 800 MG PO TABS: 800.0000 mg | ORAL_TABLET | Freq: Three times a day (TID) | ORAL | 0 refills | Status: DC

## 2022-12-13 NOTE — ED Provider Notes (Signed)
[place] EMERGENCY DEPARTMENT AT [facility] Provider Note   CSN: [id number] Arrival date & time: 12/13/22  1653     History  Chief Complaint  Patient presents with   Shoulder Pain    Stacey Cook is a 27 y.o. female.   Shoulder Pain Associated symptoms: no back pain, no fever and no neck pain         Stacey Cook is a 27 y.o. female who presents to the Emergency Department complaining of right shoulder pain after mechanical fall that occurred earlier today.  States that she slipped on some melted ice in the floor landed on her right shoulder.  She is unsure if she struck her right elbow as well.  She describes numbness and tingling of her fingers that occurred immediately after the fall.  She has pain with attempted movement of her shoulder and states that she heard "cracking and popping."  Denies head injury or LOC.  No significant neck pain, denies chest pain or shortness of breath.    Home Medications Prior to Admission medications   Medication Sig Start Date End Date Taking? Authorizing Provider  albuterol (PROVENTIL) (2.5 MG/3ML) 0.083% nebulizer solution Take 2.5 mg by nebulization See admin instructions. 1 vial via nebulizer every 6 to 8 hours as needed for shortness of breath 01/29/20   [provider]  albuterol (VENTOLIN HFA) 108 (90 Base) MCG/ACT inhaler Inhale 1-2 puffs into the lungs every 6 (six) hours as needed for wheezing or shortness of breath. 03/13/21   Particia Nearing, PA-C  doxycycline (VIBRA-TABS) 100 MG tablet Take 1 tablet (100 mg total) by mouth 2 (two) times daily. 11/26/22   Adline Potter, NP  etonogestrel-ethinyl estradiol (NUVARING) 0.12-0.015 MG/24HR vaginal ring Insert vaginally and leave in place for 3 consecutive weeks, then remove for 1 week. 11/26/22   Adline Potter, NP  metroNIDAZOLE (FLAGYL) 500 MG tablet Take 1 tablet (500 mg total) by mouth 2 (two) times daily. 11/26/22   Adline Potter, NP       Allergies    Cat hair extract, Dilaudid [hydromorphone], Keflex [cephalexin], Other, and Adhesive [tape]    Review of Systems   Review of Systems  Constitutional:  Negative for chills and fever.  Respiratory:  Negative for cough and shortness of breath.   Cardiovascular:  Negative for chest pain.  Gastrointestinal:  Negative for abdominal pain, nausea and vomiting.  Musculoskeletal:  Positive for arthralgias (Right shoulder pain right elbow pain). Negative for back pain and neck pain.  Skin:  Negative for wound.  Neurological:  Negative for dizziness, syncope, weakness and headaches.    Physical Exam Updated Vital Signs BP (!) 110/54 (BP Location: Left Arm)   Pulse 77   Temp 97.9 F (36.6 C) (Oral)   Resp 19   Ht 4\' 11"  (1.499 m)   Wt 83.5 kg   LMP 12/13/2022 (Exact Date)   SpO2 96%   BMI 37.16 kg/m  Physical Exam Vitals and nursing note reviewed.  Constitutional:      General: She is not in acute distress.    Appearance: Normal appearance. She is not ill-appearing or toxic-appearing.  Cardiovascular:     Rate and Rhythm: Normal rate and regular rhythm.     Pulses: Normal pulses.  Pulmonary:     Effort: Pulmonary effort is normal.  Chest:     Chest wall: No tenderness.  Musculoskeletal:        General: Tenderness and signs of injury  present.     Right shoulder: Tenderness present. No swelling, deformity, bony tenderness or crepitus. Decreased range of motion. Normal strength. Normal pulse.     Cervical back: Normal range of motion. No tenderness.     Comments: Diffuse tenderness to palpation of the right trapezius muscle and right AC joint space.  No edema or bony deformity.  Patient guarding with attempted range of motion of the right shoulder.  She also has tenderness to palpation of the right medial and lateral elbow.  I do not appreciate any edema or bony deformity.  No tenderness on range of motion of the fingers or wrist  Skin:    General: Skin is warm.      Capillary Refill: Capillary refill takes less than 2 seconds.  Neurological:     General: No focal deficit present.     Mental Status: She is alert.     ED Results / Procedures / Treatments   Labs (all labs ordered are listed, but only abnormal results are displayed) Labs Reviewed - No data to display  EKG None  Radiology DG Elbow Complete Right  Result Date: 12/13/2022 CLINICAL DATA:  Fall, right upper extremity injury EXAM: RIGHT ELBOW - COMPLETE 3+ VIEW COMPARISON:  01/17/2021 FINDINGS: Only frontal and oblique images could be performed due to patient's limited mobility. The frontal images with the elbow flexed. Accordingly there is substantially reduced diagnostic sensitivity and specificity, and given these limitations, CT should be considered. A lateral view could not be performed. An obvious cortical discontinuity is not observed. Probable haversian canal in the distal humeral metaphysis. IMPRESSION: 1. Substantially reduced diagnostic sensitivity and specificity due to patient's limited mobility. No lateral view obtained. Indeterminate for elbow joint effusion. No obvious fracture on flexed frontal view and oblique view. Consider CT if high index of clinical suspicion. Electronically Signed   By: Gaylyn Rong M.D.   On: 12/13/2022 19:04   DG Shoulder Right  Result Date: 12/13/2022 CLINICAL DATA:  Right shoulder pain.  Slip and fall. EXAM: RIGHT SHOULDER - 2+ VIEW COMPARISON:  11/27/2021 FINDINGS: There is no evidence of fracture or dislocation. Stable bone island in the glenoid. There is no evidence of arthropathy or other focal bone abnormality. Soft tissues are unremarkable. IMPRESSION: Negative radiographs of the right shoulder. Electronically Signed   By: Narda Rutherford M.D.   On: 12/13/2022 18:03    Procedures Procedures    Medications Ordered in ED Medications - No data to display  ED Course/ Medical Decision Making/ A&P                                 Medical  Decision Making Patient here for evaluation of mechanical injury that occurred earlier today.  States that she slipped on a wet floor and fell on her right shoulder.  Unsure if she may have struck her elbow as well.  She does have pain with range of motion of the elbow and describes numbness and tingling of angers of the right hand.  No head injury or LOC.  Clinically I suspect this is contusion versus sprain, fracture dislocation also considered  Amount and/or Complexity of Data Reviewed Radiology: ordered.    Details: X-ray of the right shoulder without acute bony finding  X-ray of the right elbow shows no obvious fracture but imaging limited due to patient's limited mobility and no lateral view obtained. Discussion of management or test interpretation with external  provider(s): I have low clinical suspicion for acute elbow injury as patient reports most of her pain being in her shoulder.  She does resist me with attempted range of motion with shoulder and elbow.  There is no obvious bony deformities or edema of the elbow or shoulder.  extremity is neurovascularly intact.  I have discussed possibility of occult fracture given limited plain film imaging Offered CT imaging tonight but patient prefers treatment with splint, sling and local orthopedic follow-up.    Long-arm splint applied by nursing prescription written for NSAID  Risk Prescription drug management.           Final Clinical Impression(s) / ED Diagnoses Final diagnoses:  Acute pain of left shoulder  Injury of left elbow, initial encounter    Rx / DC Orders ED Discharge Orders     None         Pauline Aus, PA-C 12/15/22 1748    Derwood Kaplan, MD 12/18/22 [phone]

## 2022-12-13 NOTE — Discharge Instructions (Signed)
You may apply ice packs on and off to your shoulder.  As discussed, do not wear the sling continuously.  Call the orthopedic provider listed to arrange follow-up appointment.  Return to the emergency department for any new or worsening symptoms

## 2022-12-13 NOTE — ED Notes (Signed)
Pt stated they slipped on ice and fell today Pt states pain is 10/10 and is in her shoulder moving towards her elbow Pt able to move fingers and arm

## 2022-12-13 NOTE — ED Triage Notes (Signed)
Pt comes in with rt shoulder pain. Pt states she slipped on some ice at the house landing on the rt shoulder.

## 2022-12-15 ENCOUNTER — Ambulatory Visit (INDEPENDENT_AMBULATORY_CARE_PROVIDER_SITE_OTHER): Payer: Medicaid Other | Admitting: Orthopedic Surgery

## 2022-12-15 ENCOUNTER — Encounter: Payer: Self-pay | Admitting: Orthopedic Surgery

## 2022-12-15 VITALS — Ht 59.0 in | Wt 185.1 lb

## 2022-12-15 DIAGNOSIS — M79601 Pain in right arm: Secondary | ICD-10-CM | POA: Diagnosis not present

## 2022-12-15 MED ORDER — PREDNISONE 10 MG (21) PO TBPK: ORAL_TABLET | ORAL | 0 refills | Status: DC

## 2022-12-15 MED ORDER — CYCLOBENZAPRINE HCL 10 MG PO TABS: 10.0000 mg | ORAL_TABLET | Freq: Two times a day (BID) | ORAL | 0 refills | Status: DC | PRN

## 2022-12-15 NOTE — Progress Notes (Signed)
New Patient Visit  Assessment: Stacey Cook is a 27 y.o. female with the following: 1. Right arm pain  Plan: TIA BEDWELL has diffuse right arm pain, after a fall onto her right side few days ago.  She does have some numbness and tingling in the right hand.  Radiographs of the shoulder and elbow are without obvious injury.  The splint was removed in clinic today.  There is no bruising, or point tenderness.  Encouraged her to get out of the sling.  Okay for gentle range of motion of the shoulder, elbow and hand.  I provided her with a short course of prednisone, as well as some Flexeril.  She will return to clinic in 2 weeks for repeat evaluation.  Follow-up: Return in about 2 weeks (around 12/29/2022).  Subjective:  Chief Complaint  Patient presents with   Elbow Injury    DOI 12/13/22 Slipped on a piece of ice in the kitchen and fell. From shoulder down to finger tips is numb, but if bumped it hurts really bad.    History of Present Illness: Stacey Cook is a 27 y.o. female who presents for evaluation of right arm pain.  A couple of days ago, she slipped on some ice, belted in her kitchen.  She landed on her right side.  She is unsure how she fell, or how she landed.  She states that immediately from the mid upper arm distal, she had numbness and tingling.  This has improved, but she continues to have some numbness and tingling in the right hand.  She has been in a splint for the past 2 days, since evaluation in the emergency department.  She continues to have pain in the right arm.  She continues to use a sling.  She is taking ibuprofen, with limited improvement in her symptoms.   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 Lovell Sheehan, pt will most likely go home and he will tell her to arrive at 10:45 - already has labs   CHOLESTEATOMA EXCISION  COCHLEAR IMPLANT Right    COCHLEAR IMPLANT REMOVAL Right 05/2020   DILATION AND EVACUATION N/A 09/11/2016   Procedure: DILATATION AND CURRATAGE  2ND TRIMESTER;  Surgeon: Tilda Burrow, MD;  Location: [facility] ORS;  Service: Gynecology;  Laterality: N/A;   IMPLANTATION BONE ANCHORED HEARING AID Right 04/2013   INNER EAR SURGERY Right 03/31/2018   MIDDLE EAR SURGERY     28 surgeries for cholesteatoma   TYMPANOPLASTY Left    TYMPANOSTOMY      Family History  Problem Relation Age of Onset   Hypertension Maternal Grandmother    Diabetes Maternal Grandmother    Stroke Maternal Grandmother    Seizures Maternal Grandmother    Asthma Maternal Grandmother    Hyperlipidemia Maternal Grandmother    Thyroid disease Maternal Grandmother    Cancer Maternal Grandmother    Cholelithiasis Mother    Kidney disease Mother        stones   Depression Mother    Asthma  Brother    Scoliosis Daughter    Autism Son    Seizures Maternal Uncle    Cancer Other        breast- great aunt   Celiac disease Neg Hx    Social History   Tobacco Use   Smoking status: Every Day    Current packs/day: 0.00    Average packs/day: 0.5 packs/day for 1 year (0.5 ttl pk-yrs)    Types: Cigarettes    Start date: 05/2020    Last attempt to quit: 05/2021    Years since quitting: 1.6   Smokeless tobacco: Never   Tobacco comments:    "3-4 cigs a day "  Vaping Use   Vaping status: Every Day   Substances: Flavoring  Substance Use Topics   Alcohol use: Yes    Comment: occ   Drug use: No    Allergies  Allergen Reactions   Cat Hair Extract Other (See Comments)    Puffy, watery eyes; sneezing   Dilaudid [Hydromorphone] Other (See Comments)    Vomiting, rib pain, dizzy, flushed, sweating   Keflex [Cephalexin] Other (See Comments)    Reaction:  Elevated liver enzymes    Other Other (See Comments)    Pt is allergic to surgical glue swelling;  Cats-congestion, sneezing, eyes watery, itching all over Reaction:  Infection    Adhesive [Tape] Rash    Current Meds  Medication Sig   albuterol (PROVENTIL) (2.5 MG/3ML) 0.083% nebulizer solution Take 2.5 mg by nebulization See admin instructions. 1 vial via nebulizer every 6 to 8 hours as needed for shortness of breath   albuterol (VENTOLIN HFA) 108 (90 Base) MCG/ACT inhaler Inhale 1-2 puffs into the lungs every 6 (six) hours as needed for wheezing or shortness of breath.   cyclobenzaprine (FLEXERIL) 10 MG tablet Take 1 tablet (10 mg total) by mouth 2 (two) times daily as needed.   etonogestrel-ethinyl estradiol (NUVARING) 0.12-0.015 MG/24HR vaginal ring Insert vaginally and leave in place for 3 consecutive weeks, then remove for 1 week.   ibuprofen (ADVIL) 800 MG tablet Take 1 tablet (800 mg total) by mouth 3 (three) times daily. Take with food   predniSONE (STERAPRED UNI-PAK 21 TAB) 10 MG (21) TBPK tablet 10 mg DS 12 as  directed    Objective: Ht 4\' 11"  (1.499 m)   Wt 185 lb 2 oz (84 kg)   LMP 12/13/2022 (Exact Date)   BMI 37.39 kg/m   Physical Exam:  General: Alert and oriented. and No acute distress. Gait:  Normal gait.  Evaluation of the right arm demonstrates no swelling.  No bruising.  She has diffuse tenderness throughout the right arm.  She tolerates gentle range of motion of the elbow, although it is painful.  She is unable to make a full fist.  Decreased sensation to all fingers of the right hand.  2+ radial pulse.  IMAGING: I personally reviewed images previously obtained from the ED  X-rays of the right shoulder were obtained in the emergency department.  No obvious injury.  No dislocation.  Limited x-rays of the right elbow were obtained in the emergency department.  No obvious injuries on available imaging.  Elbow is reduced.   New Medications:  Meds ordered this encounter  Medications   predniSONE (STERAPRED UNI-PAK 21 TAB) 10 MG (21) TBPK tablet    Sig: 10 mg DS 12 as directed    Dispense:  48 tablet    Refill:  0   cyclobenzaprine (FLEXERIL) 10 MG tablet    Sig: Take 1 tablet (10 mg total) by mouth 2 (two) times daily as needed.    Dispense:  20 tablet    Refill:  0      Oliver Barre, MD  12/15/2022 1:36 PM

## 2022-12-29 ENCOUNTER — Ambulatory Visit: Payer: Medicaid Other | Admitting: Orthopedic Surgery

## 2023-03-04 ENCOUNTER — Other Ambulatory Visit (HOSPITAL_BASED_OUTPATIENT_CLINIC_OR_DEPARTMENT_OTHER): Payer: Self-pay

## 2023-03-04 DIAGNOSIS — R5383 Other fatigue: Secondary | ICD-10-CM

## 2023-03-29 ENCOUNTER — Ambulatory Visit (HOSPITAL_BASED_OUTPATIENT_CLINIC_OR_DEPARTMENT_OTHER): Payer: Medicaid Other | Attending: Physician Assistant | Admitting: Internal Medicine

## 2023-03-29 VITALS — Ht 59.0 in | Wt 194.4 lb

## 2023-03-29 DIAGNOSIS — R5383 Other fatigue: Secondary | ICD-10-CM | POA: Diagnosis present

## 2023-03-29 DIAGNOSIS — G4733 Obstructive sleep apnea (adult) (pediatric): Secondary | ICD-10-CM

## 2023-04-03 DIAGNOSIS — R5383 Other fatigue: Secondary | ICD-10-CM

## 2023-04-03 NOTE — Procedures (Signed)
   Patient Name: Stacey Cook, Bolan Date: 03/29/2023 Gender: Female D.O.B: December 23, 1995 Age (years): 27 Referring Provider: Vickki Muff PA Height (inches): 59 Interpreting Physician: Jetty Duhamel MD, ABSM Weight (lbs): 194.40 RPSGT: Shelah Lewandowsky BMI: 39 MRN: [id number] Neck Size: 14.50  CLINICAL INFORMATION Sleep Study Type: NPSG Indication for sleep study: Excessive Daytime Sleepiness, Fatigue, Morning Headaches, Obesity, Sleep walking/talking/parasomnias, Snorin Epworth Sleepiness Score: 12  SLEEP STUDY TECHNIQUE As per the AASM Manual for the Scoring of Sleep and Associated Events v2.3 (April 2016) with a hypopnea requiring 4% desaturations.  The channels recorded and monitored were frontal, central and occipital EEG, electrooculogram (EOG), submentalis EMG (chin), nasal and oral airflow, thoracic and abdominal wall motion, anterior tibialis EMG, snore microphone, electrocardiogram, and pulse oximetry.  MEDICATIONS Medications self-administered by patient taken the night of the study : none reported  SLEEP ARCHITECTURE The study was initiated at 10:18:50 PM and ended at 5:23:44 AM.  Sleep onset time was 23.8 minutes and the sleep efficiency was 92.3%. The total sleep time was 392.1 minutes.  Stage REM latency was 78.5 minutes.  The patient spent 4.5% of the night in stage N1 sleep, 66.6% in stage N2 sleep, 3.2% in stage N3 and 25.8% in REM.  Alpha intrusion was absent.  Supine sleep was 16.09%.  RESPIRATORY PARAMETERS The overall apnea/hypopnea index (AHI) was 9.5 per hour. There were 4 total apneas, including 3 obstructive, 1 central and 0 mixed apneas. There were 58 hypopneas and 33 RERAs.  The AHI during Stage REM sleep was 22.0 per hour.  AHI while supine was 26.6 per hour.  The mean oxygen saturation was 94.2%. The minimum SpO2 during sleep was 86.0%.  loud snoring was noted during this study.  CARDIAC DATA The 2 lead EKG demonstrated sinus rhythm. The  mean heart rate was 73.6 beats per minute. Other EKG findings include: None.  LEG MOVEMENT DATA The total PLMS were 0 with a resulting PLMS index of 0.0. Associated arousal with leg movement index was 0.0 .  IMPRESSIONS - Mild obstructive sleep apnea occurred during this study (AHI = 9.5/h). - Insufficient early sleep and events to meet protocol requirements for split CPAP titration. - No significant central sleep apnea occurred during this study (CAI = 0.2/h). - Mild oxygen desaturation was noted during this study (Min O2 = 86.0%, Mean 94.2%). - The patient snored with loud snoring volume. - No cardiac abnormalities were noted during this study. - Clinically significant periodic limb movements did not occur during sleep. No significant associated arousals.  DIAGNOSIS - Obstructive Sleep Apnea (G47.33)  RECOMMENDATIONS - Suggest CPAP titration sleep study or autopap. Other options would be based on clinical judgment. - Positional therapy avoiding supine position during sleep. - Be careful with alcohol, sedatives and other CNS depressants that may worsen sleep apnea and disrupt normal sleep architecture. - Sleep hygiene should be reviewed to assess factors that may improve sleep quality. - Weight management and regular exercise should be initiated or continued if appropriate.  [Electronically signed] 04/03/2023 02:29 PM  Jetty Duhamel MD, ABSM Diplomate, American Board of Sleep Medicine NPI: [id number]                          Jetty Duhamel Diplomate, American Board of Sleep Medicine  ELECTRONICALLY SIGNED ON:  04/03/2023, 2:23 PM [facility] PH: [phone]   FX: [phone] ACCREDITED BY THE AMERICAN ACADEMY OF SLEEP MEDICINE

## 2023-04-12 ENCOUNTER — Other Ambulatory Visit: Payer: Self-pay | Admitting: Obstetrics & Gynecology

## 2023-04-12 ENCOUNTER — Other Ambulatory Visit: Payer: Self-pay | Admitting: Adult Health

## 2023-04-12 ENCOUNTER — Ambulatory Visit: Payer: Medicaid Other | Admitting: Radiology

## 2023-04-12 ENCOUNTER — Ambulatory Visit (INDEPENDENT_AMBULATORY_CARE_PROVIDER_SITE_OTHER): Payer: Medicaid Other

## 2023-04-12 VITALS — BP 109/67 | HR 89 | Ht 59.5 in | Wt 195.6 lb

## 2023-04-12 DIAGNOSIS — O3680X0 Pregnancy with inconclusive fetal viability, not applicable or unspecified: Secondary | ICD-10-CM

## 2023-04-12 DIAGNOSIS — O26841 Uterine size-date discrepancy, first trimester: Secondary | ICD-10-CM

## 2023-04-12 DIAGNOSIS — O09291 Supervision of pregnancy with other poor reproductive or obstetric history, first trimester: Secondary | ICD-10-CM | POA: Diagnosis not present

## 2023-04-12 DIAGNOSIS — Z3201 Encounter for pregnancy test, result positive: Secondary | ICD-10-CM | POA: Diagnosis not present

## 2023-04-12 DIAGNOSIS — Z3A08 8 weeks gestation of pregnancy: Secondary | ICD-10-CM | POA: Diagnosis not present

## 2023-04-12 DIAGNOSIS — O09299 Supervision of pregnancy with other poor reproductive or obstetric history, unspecified trimester: Secondary | ICD-10-CM

## 2023-04-12 LAB — POCT URINE PREGNANCY: Preg Test, Ur: POSITIVE — AB

## 2023-04-12 MED ORDER — PRENATAL PLUS 27-1 MG PO TABS: 1.0000 | ORAL_TABLET | Freq: Every day | ORAL | 12 refills | Status: AC

## 2023-04-12 MED ORDER — ONDANSETRON HCL 4 MG PO TABS: 4.0000 mg | ORAL_TABLET | Freq: Three times a day (TID) | ORAL | 1 refills | Status: DC | PRN

## 2023-04-12 NOTE — Progress Notes (Signed)
GA = 8+1 (uncertain LMP)   Anteverted Uterus with single viable IUP GS intact within fundus CRL = 2.18 mm = 5+5  FHR = 113 bpm Normal yolk sac = 3.7 mm  normal ovaries   Rt ov with CL = 23 x 22 mm  neg adnexal regions Neg CDS - no free fluid  S < D     EDD by u/s today = 12-08-23

## 2023-04-12 NOTE — Progress Notes (Signed)
   NURSE VISIT- PREGNANCY CONFIRMATION   SUBJECTIVE:  Stacey Cook is a 27 y.o. [phone] female at [redacted]w[redacted]d by uncertain LMP of Patient's last menstrual period was 02/14/2023 (approximate). Here for pregnancy confirmation.  Home pregnancy test: positive x 1   She reports nausea.  She is not taking prenatal vitamins.    OBJECTIVE:  BP 109/67 (BP Location: Right Arm, Patient Position: Sitting, Cuff Size: Large)   Pulse 89   Ht 4' 11.5" (1.511 m)   Wt 195 lb 9.6 oz (88.7 kg)   LMP 02/14/2023 (Approximate)   BMI 38.85 kg/m   Appears well, in no apparent distress  Results for orders placed or performed in visit on 04/12/23 (from the past 24 hour(s))  POCT urine pregnancy   Collection Time: 04/12/23  8:37 AM  Result Value Ref Range   Preg Test, Ur Positive (A) Negative    ASSESSMENT: Positive pregnancy test, [redacted]w[redacted]d by LMP    PLAN: Schedule for dating ultrasound in 1 day Prenatal vitamins: note routed to Cyril Mourning NP to send prescription   Nausea medicines: requested-note routed to Cyril Mourning NP to send prescription   OB packet given: Herminio Commons  04/12/2023 9:09 AM

## 2023-04-12 NOTE — Progress Notes (Signed)
 Rx PNV and Zofran

## 2023-04-28 ENCOUNTER — Ambulatory Visit: Payer: Medicaid Other | Admitting: Internal Medicine

## 2023-04-29 ENCOUNTER — Other Ambulatory Visit: Payer: Self-pay

## 2023-04-29 ENCOUNTER — Encounter (HOSPITAL_COMMUNITY): Payer: Self-pay

## 2023-04-29 ENCOUNTER — Ambulatory Visit: Payer: Medicaid Other | Attending: Internal Medicine | Admitting: Internal Medicine

## 2023-04-29 ENCOUNTER — Emergency Department (HOSPITAL_COMMUNITY)
Admission: EM | Admit: 2023-04-29 | Discharge: 2023-04-29 | Disposition: A | Discharge status: Home / Self Care | Payer: Medicaid Other | Attending: Emergency Medicine | Admitting: Emergency Medicine

## 2023-04-29 ENCOUNTER — Encounter: Payer: Self-pay | Admitting: Internal Medicine

## 2023-04-29 VITALS — BP 102/64 | HR 75 | Ht 59.5 in | Wt 200.0 lb

## 2023-04-29 DIAGNOSIS — Z3A01 Less than 8 weeks gestation of pregnancy: Secondary | ICD-10-CM | POA: Insufficient documentation

## 2023-04-29 DIAGNOSIS — O26891 Other specified pregnancy related conditions, first trimester: Secondary | ICD-10-CM | POA: Diagnosis present

## 2023-04-29 DIAGNOSIS — R002 Palpitations: Secondary | ICD-10-CM | POA: Diagnosis not present

## 2023-04-29 DIAGNOSIS — R1032 Left lower quadrant pain: Secondary | ICD-10-CM | POA: Diagnosis not present

## 2023-04-29 LAB — CBC
HCT: 37.3 % (ref 36.0–46.0)
Hemoglobin: 12.8 g/dL (ref 12.0–15.0)
MCH: 30.9 pg (ref 26.0–34.0)
MCHC: 34.3 g/dL (ref 30.0–36.0)
MCV: 90.1 fL (ref 80.0–100.0)
Platelets: 163 10*3/uL (ref 150–400)
RBC: 4.14 MIL/uL (ref 3.87–5.11)
RDW: 12.8 % (ref 11.5–15.5)
WBC: 8.2 10*3/uL (ref 4.0–10.5)
nRBC: 0 % (ref 0.0–0.2)

## 2023-04-29 LAB — COMPREHENSIVE METABOLIC PANEL
ALT: 26 U/L (ref 0–44)
AST: 22 U/L (ref 15–41)
Albumin: 3.7 g/dL (ref 3.5–5.0)
Alkaline Phosphatase: 76 U/L (ref 38–126)
Anion gap: 9 (ref 5–15)
BUN: 10 mg/dL (ref 6–20)
CO2: 22 mmol/L (ref 22–32)
Calcium: 9.4 mg/dL (ref 8.9–10.3)
Chloride: 104 mmol/L (ref 98–111)
Creatinine, Ser: 0.53 mg/dL (ref 0.44–1.00)
GFR, Estimated: >60 mL/min (ref >60)
Glucose, Bld: 115 mg/dL — ABNORMAL HIGH (ref 70–99)
Potassium: 3.3 mmol/L — ABNORMAL LOW (ref 3.5–5.1)
Sodium: 135 mmol/L (ref 135–145)
Total Bilirubin: 0.4 mg/dL (ref <1.2)
Total Protein: 7.2 g/dL (ref 6.5–8.1)

## 2023-04-29 LAB — URINALYSIS, ROUTINE W REFLEX MICROSCOPIC
Bilirubin Urine: NEGATIVE
Glucose, UA: NEGATIVE mg/dL
Hgb urine dipstick: NEGATIVE
Ketones, ur: NEGATIVE mg/dL
Leukocytes,Ua: NEGATIVE
Nitrite: NEGATIVE
Protein, ur: NEGATIVE mg/dL
Specific Gravity, Urine: 1.028 (ref 1.005–1.030)
pH: 5 (ref 5.0–8.0)

## 2023-04-29 LAB — WET PREP, GENITAL
Sperm: NONE SEEN
Trich, Wet Prep: NONE SEEN
WBC, Wet Prep HPF POC: <10 (ref <10)
Yeast Wet Prep HPF POC: NONE SEEN

## 2023-04-29 LAB — LIPASE, BLOOD: Lipase: 32 U/L (ref 11–51)

## 2023-04-29 LAB — HCG, QUANTITATIVE, PREGNANCY: hCG, Beta Chain, Quant, S: 134511 m[IU]/mL — ABNORMAL HIGH (ref <5)

## 2023-04-29 LAB — ABO/RH: ABO/RH(D): A POS

## 2023-04-29 MED ORDER — DOXYLAMINE-PYRIDOXINE 10-10 MG PO TBEC: 1.0000 | DELAYED_RELEASE_TABLET | Freq: Two times a day (BID) | ORAL | 0 refills | Status: DC | PRN

## 2023-04-29 MED ORDER — METRONIDAZOLE 500 MG PO TABS: 500.0000 mg | ORAL_TABLET | Freq: Two times a day (BID) | ORAL | 0 refills | Status: AC

## 2023-04-29 MED ORDER — SODIUM CHLORIDE 0.9 % IV BOLUS
1000.0000 mL | Freq: Once | INTRAVENOUS | Status: AC
  Administered 2023-04-29: 1000 mL via INTRAVENOUS

## 2023-04-29 MED ORDER — ACETAMINOPHEN 500 MG PO TABS
1000.0000 mg | ORAL_TABLET | Freq: Once | ORAL | Status: AC
  Administered 2023-04-29: 1000 mg via ORAL
  Filled 2023-04-29: qty 2

## 2023-04-29 MED ORDER — ONDANSETRON HCL 4 MG/2ML IJ SOLN
4.0000 mg | Freq: Once | INTRAMUSCULAR | Status: AC
  Administered 2023-04-29: 4 mg via INTRAVENOUS
  Filled 2023-04-29: qty 2

## 2023-04-29 NOTE — Progress Notes (Addendum)
Cardiology Office Note  Date: 04/29/2023   ID: Stacey Cook, DOB 06/07/1995, MRN [id number]  PCP:  Lovey Newcomer, PA  Cardiologist:  Marjo Bicker, MD Electrophysiologist:  None    History of Present Illness: Stacey Cook is a 27 y.o. female known to have asthma is here for follow-up visit.  No interval syncopal episodes.  She continues to have prodrome of dizziness, chest pain and palpitations.  Did not get better or worse since the last clinic visit.  Event monitor was performed, patient wore for only 1 day and 18 hours, no arrhythmias noted.  Symptoms correlated with NSR (73 to 123 bpm), SVE and VE.  EKG from 4/24 reviewed and showed NSR, no ischemia, borderline prolonged QT interval however subsequent EKGs showed NSR and normal QTc interval.  No family history of sudden cardiac death.  She is currently pregnant.  She just got out of the hospital/ER for management of dehydration.  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 Lovell Sheehan, pt will most likely go home and he will tell her to arrive at 10:45 - already has labs   CHOLESTEATOMA EXCISION     COCHLEAR IMPLANT Right    COCHLEAR IMPLANT REMOVAL Right 05/2020   DILATION AND EVACUATION N/A 09/11/2016   Procedure: DILATATION AND CURRATAGE  2ND TRIMESTER;  Surgeon: Tilda Burrow, MD;  Location: [facility] ORS;  Service: Gynecology;  Laterality: N/A;   IMPLANTATION BONE ANCHORED HEARING AID Right 04/2013   INNER EAR SURGERY Right 03/31/2018   MIDDLE EAR SURGERY     28 surgeries for cholesteatoma   TYMPANOPLASTY Left    TYMPANOSTOMY      Current Outpatient Medications  Medication Sig Dispense Refill   albuterol (PROVENTIL) (2.5 MG/3ML) 0.083% nebulizer solution Take 2.5 mg by nebulization See admin instructions. 1 vial via nebulizer every 6 to 8 hours as needed for shortness of breath     albuterol (VENTOLIN HFA) 108 (90 Base) MCG/ACT inhaler Inhale 1-2 puffs into the lungs every 6 (six) hours as needed for wheezing or shortness of breath. 18 g 0   Doxylamine-Pyridoxine 10-10 MG TBEC Take 1 tablet by mouth 2 (two) times daily as needed. 60 tablet 0   metroNIDAZOLE (FLAGYL) 500  MG tablet Take 1 tablet (500 mg total) by mouth 2 (two) times daily for 7 days. 14 tablet 0   ondansetron (ZOFRAN) 4 MG tablet Take 1 tablet (4 mg total) by mouth every 8 (eight) hours as needed. 20 tablet 1   prenatal vitamin w/FE, FA (PRENATAL 1 + 1) 27-1 MG TABS tablet Take 1 tablet by mouth daily at 12 noon. 30 tablet 12   No current facility-administered medications for this visit.   Allergies:  Cat hair extract, Dilaudid [hydromorphone], Keflex [cephalexin], Other, and Adhesive [tape]   Social History: The patient  reports that she quit smoking about 1 years ago. Her smoking use included cigarettes. She started smoking about 2 years ago. She has a 0.5 pack-year smoking history. She has never used smokeless tobacco. She reports that she does not  currently use alcohol. She reports that she does not use drugs.   Family History: The patient's family history includes Asthma in her brother and maternal grandmother; Autism in her son; Cancer in her maternal grandmother and another family member; Cholelithiasis in her mother; Depression in her mother; Diabetes in her maternal grandmother; Hyperlipidemia in her maternal grandmother; Hypertension in her maternal grandmother; Kidney disease in her mother; Scoliosis in her daughter; Seizures in her maternal grandmother and maternal uncle; Stroke in her maternal grandmother; Thyroid disease in her maternal grandmother.   ROS:  Please see the history of present illness. Otherwise, complete review of systems is positive for none.  All other systems are reviewed and negative.   Physical Exam: VS:  BP 102/64   Pulse 75   Ht 4' 11.5" (1.511 m)   Wt 200 lb (90.7 kg)   LMP  (LMP Unknown) Comment: by early u/s  SpO2 97%   BMI 39.72 kg/m , BMI Body mass index is 39.72 kg/m.  Wt Readings from Last 3 Encounters:  04/29/23 200 lb (90.7 kg)  04/12/23 195 lb 9.6 oz (88.7 kg)  03/29/23 194 lb 6.4 oz (88.2 kg)    General: Patient appears comfortable at rest. HEENT: Conjunctiva and lids normal, oropharynx clear with moist mucosa. Neck: Supple, no elevated JVP or carotid bruits, no thyromegaly. Lungs: Clear to auscultation, nonlabored breathing at rest. Cardiac: Regular rate and rhythm, no S3 or significant systolic murmur, no pericardial rub. Abdomen: Soft, nontender, no hepatomegaly, bowel sounds present, no guarding or rebound. Extremities: No pitting edema, distal pulses 2+. Skin: Warm and dry. Musculoskeletal: No kyphosis. Neuropsychiatric: Alert and oriented x3, affect grossly appropriate.  Recent Labwork: 04/29/2023: ALT 26; AST 22; BUN 10; Creatinine, Ser 0.53; Hemoglobin 12.8; Platelets 163; Potassium 3.3; Sodium 135     Component Value Date/Time   CHOL 168 11/26/2022 1750   TRIG 168 (H)  11/26/2022 1750   HDL 32 (L) 11/26/2022 1750   CHOLHDL 5.3 (H) 11/26/2022 1750   LDLCALC 106 (H) 11/26/2022 1750    Assessment and Plan:   Palpitations: Continues to have palpitations, chest pain and dizziness.  No interval syncopal episodes.  She wore event monitor for only 1 day and 18 hours that did not show any evidence of arrhythmias.  Her symptoms correlated with NSR (73 to 123 bpm), PAC and PVC.  She will need 30-day event monitor. No exertional arrhythmias on the treadmill EKG but she did not reach target heart rate and hence nondiagnostic. Strongly encouraged p.o. hydration and liberal salt intake.   Medication Adjustments/Labs and Tests Ordered: Current medicines are reviewed at length with the patient today.  Concerns regarding medicines are  outlined above.   Tests Ordered: Orders Placed This Encounter  Procedures   EKG 12-Lead    Medication Changes: No orders of the defined types were placed in this encounter.   Disposition:  Follow up as needed  Signed, Josetta Wigal Verne Spurr, MD, 04/29/2023 1:17 PM    [facility] [address] [place], [place] [address]

## 2023-04-29 NOTE — Patient Instructions (Signed)
Medication Instructions:  Your physician recommends that you continue on your current medications as directed. Please refer to the Current Medication list given to you today.  *If you need a refill on your cardiac medications before your next appointment, please call your pharmacy*   Lab Work: None If you have labs (blood work) drawn today and your tests are completely normal, you will receive your results only by: MyChart Message (if you have MyChart) OR A paper copy in the mail If you have any lab test that is abnormal or we need to change your treatment, we will call you to review the results.   Testing/Procedures: None   Follow-Up: At [facility], you and your health needs are our priority.  As part of our continuing mission to provide you with exceptional heart care, we have created designated Provider Care Teams.  These Care Teams include your primary Cardiologist (physician) and Advanced Practice Providers (APPs -  Physician Assistants and Nurse Practitioners) who all work together to provide you with the care you need, when you need it.  We recommend signing up for the patient portal called "MyChart".  Sign up information is provided on this After Visit Summary.  MyChart is used to connect with patients for Virtual Visits (Telemedicine).  Patients are able to view lab/test results, encounter notes, upcoming appointments, etc.  Non-urgent messages can be sent to your provider as well.   To learn more about what you can do with MyChart, go to [email or web address].    Your next appointment:    Follow up as needed.   Provider:   You may see Vishnu P Mallipeddi, MD or one of the following Advanced Practice Providers on your designated Care Team:   [place], PA-C  Jacolyn Reedy, [place]     Other Instructions

## 2023-04-29 NOTE — ED Notes (Signed)
Patient verbalizes understanding of discharge instructions. Opportunity for questioning and answers were provided. Armband removed by staff, pt discharged from ED. Ambulated out to lobby  

## 2023-04-29 NOTE — Discharge Instructions (Signed)
You were evaluated in the Emergency Department and after careful evaluation, we did not find any emergent condition requiring admission or further testing in the hospital.  Your exam/testing today is overall reassuring.  You tested positive for bacterial vaginosis.  Take the Flagyl antibiotic as directed.  Continue Zofran at home as needed.  Can also use the doxylamine pyridoxine medication for nausea.  Follow-up with OB/GYN  Please return to the Emergency Department if you experience any worsening of your condition.   Thank you for allowing [name] to be a part of your care.

## 2023-04-29 NOTE — ED Provider Notes (Signed)
[facility]-EMERGENCY DEPT [facility] Emergency Department Provider Note MRN:  [id number]  Arrival date & time: 04/29/23     Chief Complaint   Abdominal Pain   History of Present Illness   Stacey Cook is a 27 y.o. year-old female with no pertinent past medical history presenting to the ED with chief complaint of abdominal pain.  Left lower abdominal pain as well as persistent nausea vomiting for the past 3 days, feels dehydrated.  Also having increased quantity of white vaginal discharge recently.  [redacted] weeks pregnant.  Denies fever.  Zofran at home does not seem to be working.  No diarrhea.  Review of Systems  A thorough review of systems was obtained and all systems are negative except as noted in the HPI and PMH.   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 Lovell Sheehan, pt will most likely go home and he will tell her to arrive at 10:45 - already has labs   CHOLESTEATOMA  EXCISION     COCHLEAR IMPLANT Right    COCHLEAR IMPLANT REMOVAL Right 05/2020   DILATION AND EVACUATION N/A 09/11/2016   Procedure: DILATATION AND CURRATAGE  2ND TRIMESTER;  Surgeon: Tilda Burrow, MD;  Location: [facility] ORS;  Service: Gynecology;  Laterality: N/A;   IMPLANTATION BONE ANCHORED HEARING AID Right 04/2013   INNER EAR SURGERY Right 03/31/2018   MIDDLE EAR SURGERY     28 surgeries for cholesteatoma   TYMPANOPLASTY Left    TYMPANOSTOMY      Family History  Problem Relation Age of Onset   Hypertension Maternal Grandmother    Diabetes Maternal Grandmother    Stroke Maternal Grandmother    Seizures Maternal Grandmother    Asthma Maternal Grandmother    Hyperlipidemia Maternal Grandmother    Thyroid disease Maternal Grandmother    Cancer Maternal Grandmother    Cholelithiasis Mother    Kidney disease Mother        stones   Depression Mother    Asthma Brother    Scoliosis Daughter    Autism Son    Seizures Maternal Uncle    Cancer Other        breast- great aunt   Celiac disease Neg Hx  Social History   Socioeconomic History   Marital status: Married    Spouse name: Jory Sims   Number of children: 3   Years of education: Not on file   Highest education level: Not on file  Occupational History   Not on file  Tobacco Use   Smoking status: Former    Current packs/day: 0.00    Average packs/day: 0.5 packs/day for 1 year (0.5 ttl pk-yrs)    Types: Cigarettes    Start date: 05/2020    Quit date: 05/2021    Years since quitting: 1.9   Smokeless tobacco: Never   Tobacco comments:    "3-4 cigs a day "  Vaping Use   Vaping status: Every Day   Substances: Flavoring  Substance and Sexual Activity   Alcohol use: Not Currently    Comment: occ   Drug use: No   Sexual activity: Yes    Birth control/protection: None, Inserts  Other Topics Concern   Not on file  Social History Narrative   [employer]   Is smoker herself            Social  Drivers of Health   Financial Resource Strain: Medium Risk (11/26/2022)   Overall Financial Resource Strain (CARDIA)    Difficulty of Paying Living Expenses: Somewhat hard  Food Insecurity: Food Insecurity Present (11/26/2022)   Hunger Vital Sign    Worried About Running Out of Food in the Last Year: Sometimes true    Ran Out of Food in the Last Year: Never true  Transportation Needs: Unmet Transportation Needs (11/26/2022)   PRAPARE - Transportation    Lack of Transportation (Medical): Yes    Lack of Transportation (Non-Medical): Yes  Physical Activity: Sufficiently Active (11/26/2022)   Exercise Vital Sign    Days of Exercise per Week: 7 days    Minutes of Exercise per Session: 30 min  Stress: Stress Concern Present (11/26/2022)   [employer] of Occupational Health - Occupational Stress Questionnaire    Feeling of Stress : Rather much  Social Connections: Moderately Isolated (11/26/2022)   Social Connection and Isolation Panel [NHANES]    Frequency of Communication with Friends and Family: More than three times a week    Frequency of Social Gatherings with Friends and Family: More than three times a week    Attends Religious Services: Never    Database administrator or Organizations: No    Attends Banker Meetings: Never    Marital Status: Married  Catering manager Violence: Not At Risk (11/26/2022)   Humiliation, Afraid, Rape, and Kick questionnaire    Fear of Current or Ex-Partner: No    Emotionally Abused: No    Physically Abused: No    Sexually Abused: No     Physical Exam   Vitals:   04/29/23 0130 04/29/23 0145  BP: (!) 115/56 (!) 119/54  Pulse: 89 85  Resp:  18  Temp:    SpO2: 97% 98%    CONSTITUTIONAL: Well-appearing, NAD NEURO/PSYCH:  Alert and oriented x 3, no focal deficits EYES:  eyes equal and reactive ENT/NECK:  no LAD, no JVD CARDIO: Regular rate, well-perfused, normal S1 and S2 PULM:  CTAB no wheezing or rhonchi GI/GU:  non-distended,  non-tender MSK/SPINE:  No gross deformities, no edema SKIN:  no rash, atraumatic   *Additional and/or pertinent findings included in MDM below  Diagnostic and Interventional Summary    EKG Interpretation Date/Time:    Ventricular Rate:    PR Interval:  QRS Duration:    QT Interval:    QTC Calculation:   R Axis:      Text Interpretation:         Labs Reviewed  WET PREP, GENITAL - Abnormal; Notable for the following components:      Result Value   Clue Cells Wet Prep HPF POC PRESENT (*)    All other components within normal limits  COMPREHENSIVE METABOLIC PANEL - Abnormal; Notable for the following components:   Potassium 3.3 (*)    Glucose, Bld 115 (*)    All other components within normal limits  HCG, QUANTITATIVE, PREGNANCY - Abnormal; Notable for the following components:   hCG, Beta Chain, Quant, S 134,511 (*)    All other components within normal limits  URINALYSIS, ROUTINE W REFLEX MICROSCOPIC - Abnormal; Notable for the following components:   APPearance HAZY (*)    All other components within normal limits  CBC  LIPASE, BLOOD  ABO/RH  GC/CHLAMYDIA PROBE AMP ([place]) NOT AT [facility]    No orders to display    Medications  sodium chloride 0.9 % bolus 1,000 mL (0 mLs Intravenous Stopped 04/29/23 0514)  ondansetron (ZOFRAN) injection 4 mg (4 mg Intravenous Given 04/29/23 0213)  acetaminophen (TYLENOL) tablet 1,000 mg (1,000 mg Oral Given 04/29/23 0324)     Procedures  /  Critical Care Ultrasound ED OB Pelvic  Date/Time: 04/29/2023 2:00 AM  Performed by: Sabas Sous, MD Authorized by: Sabas Sous, MD   Procedure details:    Indications: pregnant with abdominal pain     Assess:  Fetal viability   Technique:  Transabdominal obstetric (HCG+) exam   Images: archived   Study Limitations: body habitus Uterine findings:    Endometrial stripe: identified     Single gestation: identified     Gestational sac: identified     Yolk sac: identified      Fetal pole: identified     Fetal heart rate: identified        ED Course and Medical Decision Making  Initial Impression and Ddx Left lower quadrant abdominal pain in the setting of early pregnancy.  No vaginal bleeding or discharge, has had an ultrasound with OB confirming an IUP.  Persistent nausea vomiting and so presentation is likely related to morning sickness/hyperemesis gravidarum.  Bedside ultrasound overall reassuring with normal fetal heart rate.  Providing symptomatic management, checking labs for evidence of dehydration or electrolyte disturbance or AKI, will reassess.  UTI, BV, STD, yeast infection on the differential as well.  Past medical/surgical history that increases complexity of ED encounter: Pregnant  Interpretation of Diagnostics I personally reviewed the laboratory assessment and my interpretation is as follows: No significant blood count or electrolyte disturbance  Positive clue cells  Patient Reassessment and Ultimate Disposition/Management     Patient resting comfortably with normal vital signs, reassuring laboratory assessment, discomfort in the lower abdomen possibly explained by BV.  Appropriate for discharge with OB/GYN follow-up.  Patient management required discussion with the following services or consulting groups:  None  Complexity of Problems Addressed Acute illness or injury that poses threat of life of bodily function  Additional Data Reviewed and Analyzed Further history obtained from: Prior labs/imaging results  Additional Factors Impacting ED Encounter Risk Prescriptions  Elmer Sow. Pilar Plate, MD [facility] Emergency Medicine [facility] [email or web address]  Final Clinical Impressions(s) / ED Diagnoses     ICD-10-CM   1. Left lower quadrant abdominal pain  R10.32  ED Discharge Orders          Ordered    metroNIDAZOLE (FLAGYL) 500 MG tablet  2 times daily        04/29/23 0503    Doxylamine-Pyridoxine 10-10  MG TBEC  2 times daily PRN        04/29/23 0503             Discharge Instructions Discussed with and Provided to Patient:     Discharge Instructions      You were evaluated in the Emergency Department and after careful evaluation, we did not find any emergent condition requiring admission or further testing in the hospital.  Your exam/testing today is overall reassuring.  You tested positive for bacterial vaginosis.  Take the Flagyl antibiotic as directed.  Continue Zofran at home as needed.  Can also use the doxylamine pyridoxine medication for nausea.  Follow-up with OB/GYN  Please return to the Emergency Department if you experience any worsening of your condition.   Thank you for allowing [name] to be a part of your care.       Sabas Sous, MD 04/29/23 0700

## 2023-04-29 NOTE — ED Triage Notes (Signed)
Pov from home. Cc of abdominal pain-left side. And emesis. States she's  unable to keep anything down. Ob gave her zofran but its not helping.  [redacted] weeks pregnant today. Confirmed by [name] at [facility]

## 2023-05-03 ENCOUNTER — Telehealth: Payer: Self-pay

## 2023-05-03 DIAGNOSIS — R002 Palpitations: Secondary | ICD-10-CM

## 2023-05-03 LAB — GC/CHLAMYDIA PROBE AMP (~~LOC~~) NOT AT ARMC
Chlamydia: NEGATIVE
Comment: NEGATIVE
Comment: NORMAL
Neisseria Gonorrhea: NEGATIVE

## 2023-05-03 NOTE — Telephone Encounter (Signed)
-----   Message from Stacey Cook sent at 04/29/2023  7:52 PM EST ----- Regarding: 30-day event monitor Patient wore the previous event monitor for only 1 day.  Can you obtain 30-day event monitor, diagnosis is palpitations.

## 2023-05-03 NOTE — Telephone Encounter (Signed)
Cardiac Event Monitor ordered and shipped to pt's home. [employer] to call pt to verify address prior to shipping. MyChart message sent to pt.

## 2023-05-11 NOTE — L&D Delivery Note (Addendum)
 Delivery Note Stacey Cook is a 28 y.o. [phone] at [redacted]w[redacted]d admitted for IOL for presumed ICP.   GBS Status:  --/Positive (07/02 1330)  Labor course: Initial SVE: 4/50/-2. Augmentation with: AROM and Pitocin . She then progressed to complete.  ROM: 3h 1m with clear fluid  Birth: After a 15 minute 2nd stage, she delivered a Live born female  Birth Weight:   APGAR: ,   Newborn Delivery   Birth date/time: 11/17/2023 20:37:00 Delivery type: Vaginal, Spontaneous        Delivered via vaginal birth after cesarean (VBAC) (Presentation: OA ). Nuchal cord present: Yes. reduced. Shoulders and body delivered in usual fashion. Infant placed directly on mom's abdomen for bonding/skin-to-skin, baby dried and stimulated. Cord clamped x 2 after 1 minute and cut by RN.  Cord blood collected. Placenta delivered-Spontaneous with 3 vessels. 20u Pitocin  in 500cc LR given as a bolus prior to delivery of placenta.  TXA 1 gm also given IV dt hx of PPH. Fundus firm with massage. Placenta inspected and appears to be intact with a 3 VC.  Sponge and instrument count were correct x2.  Intrapartum complications:  None Anesthesia:  epidural Lacerations:  none Suture Repair:  EBL (mL):302   Mom to postpartum.  Baby to Couplet care / Skin to Skin. Placenta to L&D   Plans to Breast and bottlefeed Contraception: undeceided Circumcision: wants inpt  Note sent to [facility]: FT for pp visit.  Delivery Report:  Review the Delivery Report for details.     Signed: Cathlean Ely, DNP,CNM 11/17/2023, 9:36 PM

## 2023-05-16 DIAGNOSIS — R002 Palpitations: Secondary | ICD-10-CM | POA: Diagnosis not present

## 2023-05-17 ENCOUNTER — Ambulatory Visit: Payer: Medicaid Other | Attending: Internal Medicine

## 2023-05-17 ENCOUNTER — Telehealth: Payer: Self-pay | Admitting: *Deleted

## 2023-05-17 ENCOUNTER — Encounter: Payer: Self-pay | Admitting: Internal Medicine

## 2023-05-17 DIAGNOSIS — R002 Palpitations: Secondary | ICD-10-CM

## 2023-05-17 NOTE — Telephone Encounter (Signed)
 Received a faxed critical event on monitor. Notified DOD Dr. Alvan of event. Called pt and was informed that she has been having  Eposodes that last around 2 mins where she becomes lightheaded and SOB. Pt states that last night around 7 pm she became lightheaded and SOB with some chest pain. This episode lasted around 10-15 mins. She denies passing out at this time. Pt states that she is not currently wearing the monitor b/c it came off in her sleep. Pt reports that she wore another monitor that would not stay on. She will come by the office to ensure that she is applying it correctly at 3 pm. No other complaints at this time.

## 2023-05-18 NOTE — Telephone Encounter (Signed)
 Pt to come to [place] office for monitor placement on 1/9

## 2023-05-18 NOTE — Telephone Encounter (Signed)
 Monitor tracings were sinus rhythm with artifact. Needs to continue wearing monitor and documenting episodes  Dominga Ferry MD

## 2023-05-19 ENCOUNTER — Encounter: Payer: Self-pay | Admitting: Women's Health

## 2023-05-19 ENCOUNTER — Ambulatory Visit: Payer: Medicaid Other

## 2023-05-20 ENCOUNTER — Other Ambulatory Visit (INDEPENDENT_AMBULATORY_CARE_PROVIDER_SITE_OTHER): Payer: Medicaid Other

## 2023-05-20 ENCOUNTER — Other Ambulatory Visit: Payer: Self-pay | Admitting: Adult Health

## 2023-05-20 VITALS — BP 112/72 | HR 84

## 2023-05-20 DIAGNOSIS — R109 Unspecified abdominal pain: Secondary | ICD-10-CM | POA: Diagnosis not present

## 2023-05-20 LAB — POCT URINALYSIS DIPSTICK OB
Blood, UA: NEGATIVE
Glucose, UA: NEGATIVE
Ketones, UA: NEGATIVE
Nitrite, UA: NEGATIVE
POC,PROTEIN,UA: NEGATIVE

## 2023-05-20 MED ORDER — PROMETHAZINE HCL 25 MG PO TABS
25.0000 mg | ORAL_TABLET | Freq: Four times a day (QID) | ORAL | 1 refills | Status: DC | PRN
Start: 2023-05-20 — End: 2023-08-17

## 2023-05-20 NOTE — Progress Notes (Signed)
   NURSE VISIT- UTI SYMPTOMS   SUBJECTIVE:  Stacey Cook is a 28 y.o. [phone] female here for UTI symptoms. She is [redacted]w[redacted]d pregnant. She reports flank pain on the left.  Denies bleeding, abnormal discharge, fever, etc.  Has been unable to eat or drink anything recently due to nausea as Zofran  and Diclegis  are not helping. Requesting phenergan .   OBJECTIVE:  LMP 02/14/2023 Comment: by early u/s  Appears well, in no apparent distress  Results for orders placed or performed in visit on 05/20/23 (from the past 24 hours)  POC Urinalysis Dipstick OB   Collection Time: 05/20/23  9:31 AM  Result Value Ref Range   Color, UA     Clarity, UA     Glucose, UA Negative Negative   Bilirubin, UA     Ketones, UA neg    Spec Grav, UA     Blood, UA neg    pH, UA     POC,PROTEIN,UA Negative Negative, Trace, Small (1+), Moderate (2+), Large (3+), 4+   Urobilinogen, UA     Nitrite, UA neg    Leukocytes, UA Trace (A) Negative   Appearance     Odor      ASSESSMENT: Pregnancy [redacted]w[redacted]d with UTI symptoms and negative nitrites  PLAN: Discussed with Stacey Cook, AGNP   Rx sent by provider today: No.  Urine culture sent Call or return to clinic prn if these symptoms worsen or fail to improve as anticipated. Follow-up: as scheduled  Phenergan  to be sent in  [place] Stacey Cook  05/20/2023 9:35 AM

## 2023-05-20 NOTE — Progress Notes (Signed)
Rx phenergan

## 2023-05-21 LAB — URINALYSIS, ROUTINE W REFLEX MICROSCOPIC
Bilirubin, UA: NEGATIVE
Glucose, UA: NEGATIVE
Ketones, UA: NEGATIVE
Nitrite, UA: NEGATIVE
Protein,UA: NEGATIVE
RBC, UA: NEGATIVE
Specific Gravity, UA: 1.023 (ref 1.005–1.030)
Urobilinogen, Ur: 1 mg/dL (ref 0.2–1.0)
pH, UA: 6 (ref 5.0–7.5)

## 2023-05-21 LAB — MICROSCOPIC EXAMINATION
Casts: NONE SEEN /[LPF]
Epithelial Cells (non renal): >10 /[HPF] — AB (ref 0–10)

## 2023-05-23 LAB — URINE CULTURE

## 2023-05-24 ENCOUNTER — Encounter: Payer: Self-pay | Admitting: Women's Health

## 2023-05-24 DIAGNOSIS — Z3490 Encounter for supervision of normal pregnancy, unspecified, unspecified trimester: Secondary | ICD-10-CM | POA: Insufficient documentation

## 2023-05-25 ENCOUNTER — Other Ambulatory Visit: Payer: Self-pay | Admitting: Obstetrics & Gynecology

## 2023-05-25 DIAGNOSIS — Z3682 Encounter for antenatal screening for nuchal translucency: Secondary | ICD-10-CM

## 2023-05-26 ENCOUNTER — Ambulatory Visit: Payer: Medicaid Other | Admitting: Women's Health

## 2023-05-26 ENCOUNTER — Encounter: Payer: Medicaid Other | Admitting: *Deleted

## 2023-05-26 ENCOUNTER — Ambulatory Visit: Payer: Medicaid Other

## 2023-05-26 ENCOUNTER — Encounter: Payer: Self-pay | Admitting: Women's Health

## 2023-05-26 VITALS — BP 101/64 | HR 82 | Wt 201.0 lb

## 2023-05-26 DIAGNOSIS — Z3481 Encounter for supervision of other normal pregnancy, first trimester: Secondary | ICD-10-CM | POA: Diagnosis not present

## 2023-05-26 DIAGNOSIS — Z3A12 12 weeks gestation of pregnancy: Secondary | ICD-10-CM

## 2023-05-26 DIAGNOSIS — Z1332 Encounter for screening for maternal depression: Secondary | ICD-10-CM | POA: Diagnosis not present

## 2023-05-26 DIAGNOSIS — Z131 Encounter for screening for diabetes mellitus: Secondary | ICD-10-CM

## 2023-05-26 DIAGNOSIS — Z3682 Encounter for antenatal screening for nuchal translucency: Secondary | ICD-10-CM

## 2023-05-26 DIAGNOSIS — Z6838 Body mass index (BMI) 38.0-38.9, adult: Secondary | ICD-10-CM

## 2023-05-26 DIAGNOSIS — Z3480 Encounter for supervision of other normal pregnancy, unspecified trimester: Secondary | ICD-10-CM

## 2023-05-26 DIAGNOSIS — Z23 Encounter for immunization: Secondary | ICD-10-CM

## 2023-05-26 MED ORDER — DOXYLAMINE-PYRIDOXINE 10-10 MG PO TBEC: DELAYED_RELEASE_TABLET | ORAL | 6 refills | Status: DC

## 2023-05-26 MED ORDER — BLOOD PRESSURE MONITOR MISC: 0 refills | Status: AC

## 2023-05-26 NOTE — Patient Instructions (Signed)
Evian, thank you for choosing our office today! We appreciate the opportunity to meet your healthcare needs. You may receive a short survey by mail, e-mail, or through MyChart. If you are happy with your care we would appreciate if you could take just a few minutes to complete the survey questions. We read all of your comments and take your feedback very seriously. Thank you again for choosing our office.  [facility] ([address] [place], [place] [address]) Entrance C, located off of [address] Free 24/7 valet parking   Nausea & Vomiting Have saltine crackers or pretzels by your bed and eat a few bites before you raise your head out of bed in the morning Eat small frequent meals throughout the day instead of large meals Drink plenty of fluids throughout the day to stay hydrated, just don't drink a lot of fluids with your meals.  This can make your stomach fill up faster making you feel sick Do not brush your teeth right after you eat Products with real ginger are good for nausea, like ginger ale and ginger hard candy Make sure it says made with real ginger! Sucking on sour candy like lemon heads is also good for nausea If your prenatal vitamins make you nauseated, take them at night so you will sleep through the nausea Sea Bands If you feel like you need medicine for the nausea & vomiting please let us know If you are unable to keep any fluids or food down please let us know   Constipation Drink plenty of fluid, preferably water, throughout the day Eat foods high in fiber such as fruits, vegetables, and grains Exercise, such as walking, is a good way to keep your bowels regular Drink warm fluids, especially warm prune juice, or decaf coffee Eat a 1/2 cup of real oatmeal (not instant), 1/2 cup applesauce, and 1/2-1 cup warm prune juice every day If needed, you may take Colace (docusate sodium) stool softener  once or twice a day to help keep the stool soft.  If you still are having problems with constipation, you may take Miralax once daily as needed to help keep your bowels regular.   Home Blood Pressure Monitoring for Patients   Your provider has recommended that you check your blood pressure (BP) at least once a week at home. If you do not have a blood pressure cuff at home, one will be provided for you. Contact your provider if you have not received your monitor within 1 week.   Helpful Tips for Accurate Home Blood Pressure Checks  Don't smoke, exercise, or drink caffeine 30 minutes before checking your BP Use the restroom before checking your BP (a full bladder can raise your pressure) Relax in a comfortable upright chair Feet on the ground Left arm resting comfortably on a flat surface at the level of your heart Legs uncrossed Back supported Sit quietly and don't talk Place the cuff on your bare arm Adjust snuggly, so that only two fingertips can fit between your skin and the top of the cuff Check 2 readings separated by at least one minute Keep a log of your BP readings For a visual, please reference this diagram: http://ccnc.care/bpdiagram  Provider Name: [facility]     Phone: [phone]  Zone 1: ALL CLEAR  Continue to monitor your symptoms:  BP reading is less than 140 (top number) or less than 90 (bottom   number)  No right upper stomach pain No headaches or seeing spots No feeling nauseated or throwing up No swelling in face and hands  Zone 2: CAUTION Call your doctor's office for any of the following:  BP reading is greater than 140 (top number) or greater than 90 (bottom number)  Stomach pain under your ribs in the middle or right side Headaches or seeing spots Feeling nauseated or throwing up Swelling in face and hands  Zone 3: EMERGENCY  Seek immediate medical care if you have any of the following:  BP reading is greater than160 (top number) or greater than  110 (bottom number) Severe headaches not improving with Tylenol Serious difficulty catching your breath Any worsening symptoms from Zone 2    First Trimester of Pregnancy The first trimester of pregnancy is from week 1 until the end of week 12 (months 1 through 3). A week after a sperm fertilizes an egg, the egg will implant on the wall of the uterus. This embryo will begin to develop into a baby. Genes from you and your partner are forming the baby. The female genes determine whether the baby is a boy or a girl. At 6-8 weeks, the eyes and face are formed, and the heartbeat can be seen on ultrasound. At the end of 12 weeks, all the baby's organs are formed.  Now that you are pregnant, you will want to do everything you can to have a healthy baby. Two of the most important things are to get good prenatal care and to follow your health care provider's instructions. Prenatal care is all the medical care you receive before the baby's birth. This care will help prevent, find, and treat any problems during the pregnancy and childbirth. BODY CHANGES Your body goes through many changes during pregnancy. The changes vary from woman to woman.  You may gain or lose a couple of pounds at first. You may feel sick to your stomach (nauseous) and throw up (vomit). If the vomiting is uncontrollable, call your health care provider. You may tire easily. You may develop headaches that can be relieved by medicines approved by your health care provider. You may urinate more often. Painful urination may mean you have a bladder infection. You may develop heartburn as a result of your pregnancy. You may develop constipation because certain hormones are causing the muscles that push waste through your intestines to slow down. You may develop hemorrhoids or swollen, bulging veins (varicose veins). Your breasts may begin to grow larger and become tender. Your nipples may stick out more, and the tissue that surrounds them  (areola) may become darker. Your gums may bleed and may be sensitive to brushing and flossing. Dark spots or blotches (chloasma, mask of pregnancy) may develop on your face. This will likely fade after the baby is born. Your menstrual periods will stop. You may have a loss of appetite. You may develop cravings for certain kinds of food. You may have changes in your emotions from day to day, such as being excited to be pregnant or being concerned that something may go wrong with the pregnancy and baby. You may have more vivid and strange dreams. You may have changes in your hair. These can include thickening of your hair, rapid growth, and changes in texture. Some women also have hair loss during or after pregnancy, or hair that feels dry or thin. Your hair will most likely return to normal after your baby is born. WHAT TO EXPECT AT YOUR PRENATAL   VISITS During a routine prenatal visit: You will be weighed to make sure you and the baby are growing normally. Your blood pressure will be taken. Your abdomen will be measured to track your baby's growth. The fetal heartbeat will be listened to starting around week 10 or 12 of your pregnancy. Test results from any previous visits will be discussed. Your health care provider may ask you: How you are feeling. If you are feeling the baby move. If you have had any abnormal symptoms, such as leaking fluid, bleeding, severe headaches, or abdominal cramping. If you have any questions. Other tests that may be performed during your first trimester include: Blood tests to find your blood type and to check for the presence of any previous infections. They will also be used to check for low iron levels (anemia) and Rh antibodies. Later in the pregnancy, blood tests for diabetes will be done along with other tests if problems develop. Urine tests to check for infections, diabetes, or protein in the urine. An ultrasound to confirm the proper growth and development  of the baby. An amniocentesis to check for possible genetic problems. Fetal screens for spina bifida and Down syndrome. You may need other tests to make sure you and the baby are doing well. HOME CARE INSTRUCTIONS  Medicines Follow your health care provider's instructions regarding medicine use. Specific medicines may be either safe or unsafe to take during pregnancy. Take your prenatal vitamins as directed. If you develop constipation, try taking a stool softener if your health care provider approves. Diet Eat regular, well-balanced meals. Choose a variety of foods, such as meat or vegetable-based protein, fish, milk and low-fat dairy products, vegetables, fruits, and whole grain breads and cereals. Your health care provider will help you determine the amount of weight gain that is right for you. Avoid raw meat and uncooked cheese. These carry germs that can cause birth defects in the baby. Eating four or five small meals rather than three large meals a day may help relieve nausea and vomiting. If you start to feel nauseous, eating a few soda crackers can be helpful. Drinking liquids between meals instead of during meals also seems to help nausea and vomiting. If you develop constipation, eat more high-fiber foods, such as fresh vegetables or fruit and whole grains. Drink enough fluids to keep your urine clear or pale yellow. Activity and Exercise Exercise only as directed by your health care provider. Exercising will help you: Control your weight. Stay in shape. Be prepared for labor and delivery. Experiencing pain or cramping in the lower abdomen or low back is a good sign that you should stop exercising. Check with your health care provider before continuing normal exercises. Try to avoid standing for long periods of time. Move your legs often if you must stand in one place for a long time. Avoid heavy lifting. Wear low-heeled shoes, and practice good posture. You may continue to have sex  unless your health care provider directs you otherwise. Relief of Pain or Discomfort Wear a good support bra for breast tenderness.   Take warm sitz baths to soothe any pain or discomfort caused by hemorrhoids. Use hemorrhoid cream if your health care provider approves.   Rest with your legs elevated if you have leg cramps or low back pain. If you develop varicose veins in your legs, wear support hose. Elevate your feet for 15 minutes, 3-4 times a day. Limit salt in your diet. Prenatal Care Schedule your prenatal visits by the   twelfth week of pregnancy. They are usually scheduled monthly at first, then more often in the last 2 months before delivery. Write down your questions. Take them to your prenatal visits. Keep all your prenatal visits as directed by your health care provider. Safety Wear your seat belt at all times when driving. Make a list of emergency phone numbers, including numbers for family, friends, the hospital, and police and fire departments. General Tips Ask your health care provider for a referral to a local prenatal education class. Begin classes no later than at the beginning of month 6 of your pregnancy. Ask for help if you have counseling or nutritional needs during pregnancy. Your health care provider can offer advice or refer you to specialists for help with various needs. Do not use hot tubs, steam rooms, or saunas. Do not douche or use tampons or scented sanitary pads. Do not cross your legs for long periods of time. Avoid cat litter boxes and soil used by cats. These carry germs that can cause birth defects in the baby and possibly loss of the fetus by miscarriage or stillbirth. Avoid all smoking, herbs, alcohol, and medicines not prescribed by your health care provider. Chemicals in these affect the formation and growth of the baby. Schedule a dentist appointment. At home, brush your teeth with a soft toothbrush and be gentle when you floss. SEEK MEDICAL CARE IF:   You have dizziness. You have mild pelvic cramps, pelvic pressure, or nagging pain in the abdominal area. You have persistent nausea, vomiting, or diarrhea. You have a bad smelling vaginal discharge. You have pain with urination. You notice increased swelling in your face, hands, legs, or ankles. SEEK IMMEDIATE MEDICAL CARE IF:  You have a fever. You are leaking fluid from your vagina. You have spotting or bleeding from your vagina. You have severe abdominal cramping or pain. You have rapid weight gain or loss. You vomit blood or material that looks like coffee grounds. You are exposed to German measles and have never had them. You are exposed to fifth disease or chickenpox. You develop a severe headache. You have shortness of breath. You have any kind of trauma, such as from a fall or a car accident. Document Released: 04/20/2001 Document Revised: 09/10/2013 Document Reviewed: 03/06/2013 ExitCare Patient Information 2015 [employer]. This information is not intended to replace advice given to you by your health care provider. Make sure you discuss any questions you have with your health care provider.  

## 2023-05-26 NOTE — Progress Notes (Signed)
INITIAL OBSTETRICAL VISIT Patient name: Stacey Cook MRN [id number]  Date of birth: 02/06/1996 Chief Complaint:   Initial Prenatal Visit (Still having midback pain)  History of Present Illness:   Stacey Cook is a 28 y.o. [phone] Caucasian female at [redacted]w[redacted]d by [name] at 5 weeks with an Estimated Date of Delivery: 12/08/23 being seen today for her initial obstetrical visit.   Patient's last menstrual period was 02/14/2023. Her obstetrical history is significant for  SAB x 2, 2019 GDM, ICP and term SVB w/ shoulder dystocia 7lb4.8oz w/ PPH requiring Bakri balloon/TXA, 2020 EPCS d/t h/o dystocia and larger baby (8lb3oz), 2021 ICP, VBAC w/o complications .   Today she reports Rt flank pain x few weeks, thought may be kidney stone-UA and urine cx neg last week. Goes away w/ laying down, starts back w/ sitting/standing.  Smokes 2-3 cigarettes/day, was also vaping- has stopped that.  Dep/anx- no meds, doing well, declines IBH Last pap 11/26/22. Results were: NILM w/ HRHPV negative     05/26/2023   10:38 AM 11/26/2022   10:11 AM 05/15/2020   10:30 AM 11/28/2019    9:56 AM 08/23/2019   11:35 AM  Depression screen PHQ 2/9  Decreased Interest 1 1 2 2 1   Down, Depressed, Hopeless 0 2 3 1 1   PHQ - 2 Score 1 3 5 3 2   Altered sleeping 1 3 1 3 3   Tired, decreased energy 2 3 1 3 3   Change in appetite 3 3 2 1 2   Feeling bad or failure about yourself  0 2 3 1 1   Trouble concentrating 1 3 1 2 1   Moving slowly or fidgety/restless 1 1 1  0 0  Suicidal thoughts 0 0 3 0 0  PHQ-9 Score 9 18 17 13 12   Difficult doing work/chores    Somewhat difficult Somewhat difficult        05/26/2023   10:38 AM 11/26/2022   10:12 AM 05/15/2020   10:33 AM 11/28/2019    9:56 AM  GAD 7 : Generalized Anxiety Score  Nervous, Anxious, on Edge 0 2 1 3   Control/stop worrying 0 3 1 1   Worry too much - different things 0 3 3 1   Trouble relaxing 2 3 1 2   Restless 2 0 0 2  Easily annoyed or irritable 3 3 3 3   Afraid - awful might  happen 0 0 3 0  Total GAD 7 Score 7 14 12 12   Anxiety Difficulty    Very difficult     Review of Systems:   Pertinent items are noted in HPI Denies cramping/contractions, leakage of fluid, vaginal bleeding, abnormal vaginal discharge w/ itching/odor/irritation, headaches, visual changes, shortness of breath, chest pain, abdominal pain, severe nausea/vomiting, or problems with urination or bowel movements unless otherwise stated above.  Pertinent History Reviewed:  Reviewed past medical,surgical, social, obstetrical and family history.  Reviewed problem list, medications and allergies. OB History  Gravida Para Term Preterm AB Living  6 3 3  0 2 3  SAB IAB Ectopic Multiple Live Births  2 0 0 0 3    # Outcome Date GA Lbr Len/2nd Weight Sex Type Anes PTL Lv  6 Current           5 Term 02/12/20 [redacted]w[redacted]d 04:55 / 00:16 6 lb 2.8 oz (2.8 kg) F VBAC EPI  LIV  4 SAB 05/04/19          3 Term 01/03/19 [redacted]w[redacted]d  8 lb 3 oz (3.714 kg)  F CS-LTranv Spinal N LIV  2 Term 10/28/17 [redacted]w[redacted]d 10:29 / 01:39 7 lb 4.8 oz (3.311 kg) M Vag-Spont EPI  LIV     Complications: Shoulder Dystocia, Gestational diabetes  1 SAB 09/11/16 [redacted]w[redacted]d          Physical Assessment:   Vitals:   05/26/23 0921  BP: 101/64  Pulse: 82  Weight: 201 lb (91.2 kg)  Body mass index is 39.92 kg/m.       Physical Examination:  General appearance - well appearing, and in no distress  Mental status - alert, oriented to person, place, and time  Psych:  She has a normal mood and affect  Skin - warm and dry, normal color, no suspicious lesions noted  Chest - effort normal, all lung fields clear to auscultation bilaterally  Heart - normal rate and regular rhythm  Abdomen - soft, nontender  Extremities:  No swelling or varicosities noted  Thin prep pap is not done   Chaperone: N/A    TODAY'S NT [name] 12 wks,measurements c/w dates,anterior placenta,normal ovaries,FHR 162 bpm,NB present,NT 1.5 mm,CRL 57.40 mm   No results found for this or any  previous visit (from the past 24 hours).  Assessment & Plan:  1) Low-Risk Pregnancy [id number] at [redacted]w[redacted]d with an Estimated Date of Delivery: 12/08/23   2) Initial OB visit  3) H/O shoulder dystocia> 7lb4.8oz, [place] and delivery of posterior arm  4) H/O ICP x 2  5) H/O GDM x 1> A1C today  6) H/O PPH x 1> w/ 1st, required Bakri/TXA  7) Prev c/s> EPCS d/t h/o shoulder dystocia and larger baby  8) Prev VBAC> wants TOLAC  9) Smoker> 2-3/day, quit vaping, declines [facility]uit[facility], plans to try to quit on her own  10) Dep/anx> no meds, doing well, declines IBH  11) PG BMI >=35  Meds:  Meds ordered this encounter  Medications   Blood Pressure Monitor MISC    Sig: For regular home bp monitoring during pregnancy    Dispense:  1 each    Refill:  0    Z34.81 Please mail to patient Needs large cuff   Doxylamine-Pyridoxine (DICLEGIS) 10-10 MG TBEC    Sig: 2 tabs q hs, if sx persist add 1 tab q am on day 3, if sx persist add 1 tab q afternoon on day 4    Dispense:  100 tablet    Refill:  6    Initial labs obtained Continue prenatal vitamins Reviewed n/v relief measures and warning s/s to report Reviewed recommended weight gain based on pre-gravid BMI Encouraged well-balanced diet Genetic & carrier screening discussed: requests Panorama and NT/IT, Horizon neg prev preg Ultrasound discussed; fetal survey: requested CCNC completed> form faxed if has or is planning to apply for medicaid The nature of [facility] [employer] with multiple MDs and other Advanced Practice Providers was explained to patient; also emphasized that fellows, residents, and students are part of our team. Does not have home bp cuff. Office bp cuff given: no. Rx sent: yes. Check bp weekly, let us know if consistently >140/90.  Flu shot today  Follow-up: Return in about 4 weeks (around 06/23/2023) for LROB, 2nd IT, CNM, in person; 8wks from now anatomy u/s and LROB w/ CNM.   Orders Placed This  Encounter  Procedures   Urine Culture   GC/Chlamydia Probe Amp   Flu vaccine trivalent PF, 6mos and older(Flulaval,Afluria,Fluarix,Fluzone)   Integrated 1   PANORAMA PRENATAL TEST   Hemoglobin A1c  CBC/D/Plt+RPR+Rh+ABO+RubIgG...    Cheral Marker CNM, [facility] 05/26/2023 12:03 PM

## 2023-05-26 NOTE — Progress Notes (Signed)
[name] 12 wks,measurements c/w dates,anterior placenta,normal ovaries,FHR 162 bpm,NB present,NT 1.5 mm,CRL 57.40 mm

## 2023-05-27 LAB — INTEGRATED 1

## 2023-05-28 LAB — CBC/D/PLT+RPR+RH+ABO+RUBIGG...
Antibody Screen: NEGATIVE
Basophils Absolute: 0.1 10*3/uL (ref 0.0–0.2)
Basos: 1 %
EOS (ABSOLUTE): 0 10*3/uL (ref 0.0–0.4)
Eos: 0 %
HCV Ab: NONREACTIVE
HIV Screen 4th Generation wRfx: NONREACTIVE
Hematocrit: 38.5 % (ref 34.0–46.6)
Hemoglobin: 13.1 g/dL (ref 11.1–15.9)
Hepatitis B Surface Ag: NEGATIVE
Immature Grans (Abs): 0 10*3/uL (ref 0.0–0.1)
Immature Granulocytes: 1 %
Lymphocytes Absolute: 2 10*3/uL (ref 0.7–3.1)
Lymphs: 24 %
MCH: 30.8 pg (ref 26.6–33.0)
MCHC: 34 g/dL (ref 31.5–35.7)
MCV: 90 fL (ref 79–97)
Monocytes Absolute: 0.6 10*3/uL (ref 0.1–0.9)
Monocytes: 7 %
Neutrophils Absolute: 5.7 10*3/uL (ref 1.4–7.0)
Neutrophils: 67 %
Platelets: 178 10*3/uL (ref 150–450)
RBC: 4.26 x10E6/uL (ref 3.77–5.28)
RDW: 12.8 % (ref 11.7–15.4)
RPR Ser Ql: NONREACTIVE
Rh Factor: POSITIVE
Rubella Antibodies, IGG: 1.24 {index} (ref >0.99)
WBC: 8.4 10*3/uL (ref 3.4–10.8)

## 2023-05-28 LAB — INTEGRATED 1
Crown Rump Length: 57.4 mm
Gest. Age on Collection Date: 12.1 wk
PAPP-A Value: 433.9 ng/mL
Race: 1
Sonographer ID#: 25.8 a
Sonographer ID#: 309760
Weight: 1.5 mm
Weight: 201 [lb_av]

## 2023-05-28 LAB — HCV INTERPRETATION

## 2023-05-28 LAB — HEMOGLOBIN A1C
Est. average glucose Bld gHb Est-mCnc: 94 mg/dL
Hgb A1c MFr Bld: 4.9 % (ref 4.8–5.6)

## 2023-05-29 LAB — GC/CHLAMYDIA PROBE AMP
Chlamydia trachomatis, NAA: NEGATIVE
Neisseria Gonorrhoeae by PCR: NEGATIVE

## 2023-05-31 ENCOUNTER — Encounter: Payer: Self-pay | Admitting: Women's Health

## 2023-05-31 LAB — URINE CULTURE

## 2023-06-02 LAB — PANORAMA PRENATAL TEST FULL PANEL:PANORAMA TEST PLUS 5 ADDITIONAL MICRODELETIONS: FETAL FRACTION: 6

## 2023-06-03 ENCOUNTER — Emergency Department (HOSPITAL_COMMUNITY)
Admission: EM | Admit: 2023-06-03 | Discharge: 2023-06-04 | Disposition: A | Discharge status: Home / Self Care | Payer: Medicaid Other | Attending: Student | Admitting: Student

## 2023-06-03 ENCOUNTER — Other Ambulatory Visit: Payer: Self-pay

## 2023-06-03 ENCOUNTER — Encounter (HOSPITAL_COMMUNITY): Payer: Self-pay | Admitting: Emergency Medicine

## 2023-06-03 DIAGNOSIS — O99891 Other specified diseases and conditions complicating pregnancy: Secondary | ICD-10-CM | POA: Diagnosis present

## 2023-06-03 DIAGNOSIS — Z98891 History of uterine scar from previous surgery: Secondary | ICD-10-CM | POA: Diagnosis not present

## 2023-06-03 DIAGNOSIS — O26891 Other specified pregnancy related conditions, first trimester: Secondary | ICD-10-CM | POA: Diagnosis not present

## 2023-06-03 DIAGNOSIS — Z20822 Contact with and (suspected) exposure to covid-19: Secondary | ICD-10-CM | POA: Diagnosis not present

## 2023-06-03 DIAGNOSIS — J4520 Mild intermittent asthma, uncomplicated: Secondary | ICD-10-CM | POA: Insufficient documentation

## 2023-06-03 DIAGNOSIS — R519 Headache, unspecified: Secondary | ICD-10-CM | POA: Insufficient documentation

## 2023-06-03 DIAGNOSIS — Z79899 Other long term (current) drug therapy: Secondary | ICD-10-CM | POA: Insufficient documentation

## 2023-06-03 DIAGNOSIS — O99511 Diseases of the respiratory system complicating pregnancy, first trimester: Secondary | ICD-10-CM | POA: Diagnosis not present

## 2023-06-03 DIAGNOSIS — Z3A13 13 weeks gestation of pregnancy: Secondary | ICD-10-CM | POA: Insufficient documentation

## 2023-06-03 DIAGNOSIS — Z87891 Personal history of nicotine dependence: Secondary | ICD-10-CM | POA: Insufficient documentation

## 2023-06-03 NOTE — ED Triage Notes (Signed)
Pt here with c/o headache since 1-2pm. Pt called OB as she is [redacted] weeks pregnant and was "told to come to ER".

## 2023-06-04 ENCOUNTER — Emergency Department (HOSPITAL_COMMUNITY): Payer: Medicaid Other

## 2023-06-04 LAB — RESP PANEL BY RT-PCR (RSV, FLU A&B, COVID)  RVPGX2
Influenza A by PCR: NEGATIVE
Influenza B by PCR: NEGATIVE
Resp Syncytial Virus by PCR: NEGATIVE
SARS Coronavirus 2 by RT PCR: NEGATIVE

## 2023-06-04 MED ORDER — LACTATED RINGERS IV BOLUS
1000.0000 mL | Freq: Once | INTRAVENOUS | Status: AC
  Administered 2023-06-04: 1000 mL via INTRAVENOUS

## 2023-06-04 MED ORDER — IOHEXOL 350 MG/ML SOLN
75.0000 mL | Freq: Once | INTRAVENOUS | Status: AC | PRN
  Administered 2023-06-04: 75 mL via INTRAVENOUS

## 2023-06-04 MED ORDER — PROCHLORPERAZINE EDISYLATE 10 MG/2ML IJ SOLN
10.0000 mg | Freq: Once | INTRAMUSCULAR | Status: AC
  Administered 2023-06-04: 10 mg via INTRAVENOUS
  Filled 2023-06-04: qty 2

## 2023-06-04 MED ORDER — DIPHENHYDRAMINE HCL 50 MG/ML IJ SOLN
25.0000 mg | Freq: Once | INTRAMUSCULAR | Status: AC
  Administered 2023-06-04: 25 mg via INTRAVENOUS
  Filled 2023-06-04: qty 1

## 2023-06-04 NOTE — ED Provider Notes (Signed)
[place] [facility] AT [facility] Provider Note  CSN: [id number] Arrival date & time: 06/03/23 2252  Chief Complaint(s) Headache  HPI Stacey Cook is a 28 y.o. female G6, P3 [redacted] weeks pregnant with PMH cochlear implant, anemia who presents emergency room for evaluation of intractable headache.  States that symptoms have been persistently worsening over the last 12 hours, worse on the right side of the head near the temple.  States that symptoms arose after she woke up from a nap and she had some intermittent numbness of the right upper extremity that is since resolved.  She spoke with an OB triage nurse who recommended that she come to the emergency department for further evaluation.  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   HSV infection    Low iron    Mental disorder    Ovarian cyst    PONV (postoperative nausea and vomiting)    Suicidal intent    UTI (lower urinary tract infection) 05/2014   Patient Active Problem List   Diagnosis Date Noted   Encounter for supervision of normal pregnancy, antepartum 05/24/2023   Palpitations 10/20/2022   PID (acute pelvic inflammatory disease) 07/15/2021   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   Cellulitis of scalp 04/02/2019   Depression, postpartum 02/09/2019   Epidermal cyst 02/09/2019   Status post cesarean section 01/03/2019   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 with body mass index (BMI) of 37.0 to 37.9 in adult    Gastroesophageal reflux disease    Pancreatitis 01/12/2018   History of shoulder dystocia in prior pregnancy 12/02/2017   History of postpartum hemorrhage 12/02/2017   History of cholestasis during pregnancy 09/21/2017   History of gestational diabetes 08/24/2017   Chest pain of uncertain etiology 08/05/2017   Depression with anxiety and suicidal ideations 06/13/2017   Conductive hearing loss, unilat, unrestrict hearing contralateral side 12/10/2015   Binge-eating disorder, in partial remission, moderate 03/21/2015   Asthma, mild intermittent 02/18/2015   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 12/20/2014   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 Authorizing Provider  albuterol (PROVENTIL) (2.5 MG/3ML) 0.083% nebulizer solution Take 2.5 mg by nebulization See admin instructions. 1 vial via nebulizer every 6 to 8 hours as needed for shortness of breath Patient not taking: Reported on 05/26/2023 01/29/20   [provider]  albuterol (VENTOLIN HFA) 108 (90 Base) MCG/ACT inhaler Inhale 1-2 puffs into the lungs every  6 (six) hours as needed for wheezing or shortness of breath. Patient not taking: Reported on 05/26/2023 03/13/21   Particia Nearing, PA-C  Blood Pressure Monitor MISC For regular home bp monitoring during pregnancy 05/26/23   Cheral Marker, CNM  Doxylamine-Pyridoxine (DICLEGIS) 10-10 MG TBEC 2 tabs q hs, if sx persist add 1 tab q am on day 3, if sx persist add 1 tab q afternoon on day 4 05/26/23   Cheral Marker, CNM  ondansetron (ZOFRAN) 4 MG tablet Take 1 tablet (4 mg total) by mouth every 8 (eight) hours as needed. Patient not taking: Reported on 05/26/2023 04/12/23   Adline Potter, NP  prenatal vitamin w/FE, FA (PRENATAL 1 + 1) 27-1 MG TABS tablet Take 1 tablet by mouth daily at 12 noon. 04/12/23   Adline Potter, NP  promethazine (PHENERGAN) 25 MG tablet Take 1 tablet (25 mg total) by mouth every 6 (six) hours as needed for nausea or vomiting. 05/20/23   Adline Potter, NP                                                                                                                                    Past Surgical History Past Surgical History:  Procedure Laterality Date   CESAREAN SECTION N/A 01/03/2019   Procedure: CESAREAN SECTION;  Surgeon: Lazaro Arms, MD;  Location: [facility];  Service: Obstetrics;  Laterality: N/A;   CHOLECYSTECTOMY N/A 01/16/2018   Procedure: LAPAROSCOPIC CHOLECYSTECTOMY;  Surgeon: Franky Macho, MD;  Location: [facility];  Service: General;  Laterality: N/A;  per Dr. Lovell Sheehan, pt will most likely go home and he will tell her to arrive at 10:45 - already has labs   CHOLESTEATOMA EXCISION     COCHLEAR IMPLANT Right    COCHLEAR IMPLANT REMOVAL Right 05/2020   DILATION AND EVACUATION N/A 09/11/2016   Procedure: DILATATION AND CURRATAGE  2ND TRIMESTER;  Surgeon: Tilda Burrow, MD;  Location: [facility] ORS;  Service: Gynecology;  Laterality: N/A;   IMPLANTATION BONE ANCHORED HEARING AID Right 04/2013   INNER EAR SURGERY Right 03/31/2018   MIDDLE EAR SURGERY     28 surgeries for cholesteatoma   TYMPANOPLASTY Left    TYMPANOSTOMY     Family History Family History  Problem Relation Age of Onset   Hypertension Maternal Grandmother    Diabetes Maternal Grandmother    Stroke Maternal Grandmother    Seizures Maternal Grandmother    Asthma Maternal Grandmother    Hyperlipidemia Maternal Grandmother    Thyroid disease Maternal Grandmother    Cancer  Maternal Grandmother    Cholelithiasis Mother    Kidney disease Mother        stones   Depression Mother    Asthma Brother    Scoliosis Daughter    Autism Son    Seizures Maternal Uncle    Cancer Other  breast- great aunt   Celiac disease Neg Hx     Social History Social History   Tobacco Use   Smoking status: Former    Current packs/day: 0.00    Average packs/day: 0.5 packs/day for 1 year (0.5 ttl pk-yrs)    Types: Cigarettes    Start date: 05/2020    Quit date: 05/2021    Years since quitting: 2.0   Smokeless tobacco: Never   Tobacco comments:    "3-4 cigs a day "  Vaping Use   Vaping status: Former   Substances: Flavoring  Substance Use Topics   Alcohol use: Not Currently    Comment: occ   Drug use: No   Allergies Cat dander, Dilaudid [hydromorphone], Keflex [cephalexin], Other, and Adhesive [tape]  Review of Systems Review of Systems  Neurological:  Positive for headaches.    Physical Exam Vital Signs  I have reviewed the triage vital signs BP (!) 118/59 (BP Location: Right Arm)   Pulse 92   Temp 98 F (36.7 C) (Oral)   Resp 18   Ht 4' 11.5" (1.511 m)   Wt 91.2 kg   LMP 02/14/2023 Comment: by early u/s  SpO2 95%   BMI 39.92 kg/m   Physical Exam Vitals and nursing note reviewed.  Constitutional:      General: She is not in acute distress.    Appearance: She is well-developed.  HENT:     Head: Normocephalic and atraumatic.  Eyes:     Conjunctiva/sclera: Conjunctivae normal.  Cardiovascular:     Rate and Rhythm: Normal rate and regular rhythm.     Heart sounds: No murmur heard. Pulmonary:     Effort: Pulmonary effort is normal. No respiratory distress.     Breath sounds: Normal breath sounds.  Abdominal:     Palpations: Abdomen is soft.     Tenderness: There is no abdominal tenderness.  Musculoskeletal:        General: No swelling.     Cervical back: Neck supple.  Skin:    General: Skin is warm and dry.     Capillary Refill:  Capillary refill takes less than 2 seconds.  Neurological:     General: No focal deficit present.     Mental Status: She is alert.     Cranial Nerves: No cranial nerve deficit.     Motor: No weakness.  Psychiatric:        Mood and Affect: Mood normal.     ED Results and Treatments Labs (all labs ordered are listed, but only abnormal results are displayed) Labs Reviewed  RESP PANEL BY RT-PCR (RSV, FLU A&B, COVID)  RVPGX2  I-STAT CHEM 8, ED                                                                                                                          Radiology No results found.  Pertinent labs & imaging results that were available during my care of the patient were reviewed  by me and considered in my medical decision making (see MDM for details).  Medications Ordered in ED Medications  prochlorperazine (COMPAZINE) injection 10 mg (10 mg Intravenous Given 06/04/23 0248)  diphenhydrAMINE (BENADRYL) injection 25 mg (25 mg Intravenous Given 06/04/23 0248)  lactated ringers bolus 1,000 mL (1,000 mLs Intravenous New Bag/Given 06/04/23 0247)                                                                                                                                     Procedures Procedures  (including critical care time)  Medical Decision Making / ED Course   This patient presents to the ED for concern of headache, this involves an extensive number of treatment options, and is a complaint that carries with it a high risk of complications and morbidity.  The differential diagnosis includes migraine, Cluster, Tension Ha, Dural venous thrombosis, Sinusitis, CO poisoning, HTN, Malignancy  MDM: Patient seen emergency room for evaluation of a headache.  Physical exam is reassuringly unremarkable with no focal motor or sensory deficits.  No cranial nerve deficits.  COVID, flu, RSV negative and obtained in the setting of headache.  CT head, CTV negative for acute pathology or dural  venous sinus thrombosis.  Patient received a headache cocktail and on reevaluation headache has completely resolved.  With improved she does not meet inpatient criteria for admission and will be discharged with outpatient follow-up.  Return precautions given of which she voiced understanding she was discharged.   Additional history obtained:  -External records from outside source obtained and reviewed including: Chart review including previous notes, labs, imaging, consultation notes   Lab Tests: -I ordered, reviewed, and interpreted labs.   The pertinent results include:   Labs Reviewed  RESP PANEL BY RT-PCR (RSV, FLU A&B, COVID)  RVPGX2  I-STAT CHEM 8, ED     Imaging Studies ordered: I ordered imaging studies including CTV I independently visualized and interpreted imaging. I agree with the radiologist interpretation   Medicines ordered and prescription drug management: Meds ordered this encounter  Medications   prochlorperazine (COMPAZINE) injection 10 mg   diphenhydrAMINE (BENADRYL) injection 25 mg   lactated ringers bolus 1,000 mL    -I have reviewed the patients home medicines and have made adjustments as needed  Critical interventions none     Social Determinants of Health:  Factors impacting patients care include: none   Reevaluation: After the interventions noted above, I reevaluated the patient and found that they have :improved  Co morbidities that complicate the patient evaluation  Past Medical History:  Diagnosis Date   Anemia    Anginal pain (HCC)    Anxiety    Arthritis    knees   Asthma    Cervicalgia    Cholestasis during pregnancy    Chronic abdominal pain    Complication of anesthesia    light headed   Constipation    Ear mass  Gestational diabetes    pt states not been checking sugars regularly at home; nor has she been taking her Metformin   Headache    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   HSV infection     Low iron    Mental disorder    Ovarian cyst    PONV (postoperative nausea and vomiting)    Suicidal intent    UTI (lower urinary tract infection) 05/2014      Dispostion: I considered admission for this patient, but at this time she does not meet inpatient criteria for admission and will be discharged with outpatient follow-up     Final Clinical Impression(s) / ED Diagnoses Final diagnoses:  None     @PCDICTATION @    Glendora Score, MD 06/04/23 [phone]

## 2023-06-04 NOTE — ED Notes (Signed)
Pt gone to CT

## 2023-06-04 NOTE — ED Notes (Signed)
Pt back from CT

## 2023-06-06 ENCOUNTER — Encounter: Payer: Self-pay | Admitting: Women's Health

## 2023-06-07 ENCOUNTER — Telehealth: Payer: Self-pay

## 2023-06-07 NOTE — Telephone Encounter (Signed)
LMOVM returning patient's call. Informed headaches are normal in the first trimester.  Can take extra strength Tylenol, increase water intake and drink a coke or cup of coffee. Advised to call back with any further questions.

## 2023-06-07 NOTE — Telephone Encounter (Signed)
Patient would like for a nurse to call her; she is having severe headaches.  MYCHART is currently down.

## 2023-06-23 ENCOUNTER — Ambulatory Visit (INDEPENDENT_AMBULATORY_CARE_PROVIDER_SITE_OTHER): Payer: Medicaid Other | Admitting: Women's Health

## 2023-06-23 ENCOUNTER — Encounter: Payer: Self-pay | Admitting: Women's Health

## 2023-06-23 ENCOUNTER — Other Ambulatory Visit (HOSPITAL_COMMUNITY)
Admission: RE | Admit: 2023-06-23 | Discharge: 2023-06-23 | Disposition: A | Discharge status: Home / Self Care | Payer: Medicaid Other | Source: Ambulatory Visit | Attending: Women's Health | Admitting: Women's Health

## 2023-06-23 VITALS — BP 113/66 | HR 89 | Wt 201.0 lb

## 2023-06-23 DIAGNOSIS — Z3482 Encounter for supervision of other normal pregnancy, second trimester: Secondary | ICD-10-CM | POA: Insufficient documentation

## 2023-06-23 DIAGNOSIS — Z3480 Encounter for supervision of other normal pregnancy, unspecified trimester: Secondary | ICD-10-CM | POA: Diagnosis not present

## 2023-06-23 DIAGNOSIS — Z3A16 16 weeks gestation of pregnancy: Secondary | ICD-10-CM | POA: Diagnosis not present

## 2023-06-23 DIAGNOSIS — Z363 Encounter for antenatal screening for malformations: Secondary | ICD-10-CM | POA: Diagnosis not present

## 2023-06-23 NOTE — Patient Instructions (Signed)
Stacey Cook, thank you for choosing our office today! We appreciate the opportunity to meet your healthcare needs. You may receive a short survey by mail, e-mail, or through MyChart. If you are happy with your care we would appreciate if you could take just a few minutes to complete the survey questions. We read all of your comments and take your feedback very seriously. Thank you again for choosing our office.  [facility] Team at [facility] ([address] [place], [place] [address]) Entrance C, located off of [address] Free 24/7 valet parking  Go to [email or web address] to register for FREE online childbirth classes  Call the office ([phone]) or go to [facility] if: You begin to severe cramping Your water breaks.  Sometimes it is a big gush of fluid, sometimes it is just a trickle that keeps getting your panties wet or running down your legs You have vaginal bleeding.  It is normal to have a small amount of spotting if your cervix was checked.   [place] [facility]/Family Doctors [facility] ([facility]): [address], [phone]           [facility]: [address], [phone]                [facility] ([facility]): [address], [phone] (call to ask if accepting patients) [facility]: [address], [place], [phone]    [place] Pediatric[facility]/Family Doctors [facility] ([facility]): [address], [phone] [facility]: [address], [phone] [facility]: [address], [phone]  [place] [facility]  [facility] ([facility]): [phone] [facility]: [address], [phone]   [place] [facility]: [address], [phone]  [place] [facility]  [facility]: [address], [phone]  Home Blood Pressure Monitoring for Patients   Your provider has recommended that you check your blood pressure (BP) at least once a week at home. If you do not have a blood pressure cuff at home, one will be provided for you. Contact your provider if you have not received your monitor within 1 week.   Helpful Tips for Accurate Home Blood Pressure Checks  Don't smoke, exercise, or drink caffeine 30 minutes before checking your BP Use the restroom before checking your BP (a full bladder can raise your pressure) Relax in a comfortable upright chair Feet on the ground Left arm resting comfortably on a flat surface at the level of your heart Legs uncrossed Back supported Sit quietly and don't talk Place the cuff on your bare arm Adjust snuggly, so that only two fingertips can fit between your skin and the top of the cuff Check 2 readings separated by at least one minute Keep a log of your BP readings For a visual, please reference this diagram: http://[facility].care/bpdiagram  Provider Name: [facility]     Phone: [phone]  Zone 1: ALL CLEAR  Continue to monitor your symptoms:  BP reading is less than 140 (top number) or less than 90 (bottom number)  No right upper stomach pain No headaches or seeing spots No feeling nauseated or throwing up No swelling in face and hands  Zone 2: CAUTION Call your doctor's office for any of the following:  BP reading is greater than 140 (top number) or greater than   90 (bottom number)  Stomach pain under your ribs in the middle or right side Headaches or seeing spots Feeling nauseated or throwing up Swelling in face and hands  Zone 3: EMERGENCY  Seek immediate medical care if you have any of the following:  BP reading is greater than160 (top number) or greater than 110 (bottom number) Severe headaches not improving with Tylenol Serious difficulty catching your breath Any worsening symptoms from  Zone 2     Second Trimester of Pregnancy The second trimester is from week 14 through week 27 (months 4 through 6). The second trimester is often a time when you feel your best. Your body has adjusted to being pregnant, and you begin to feel better physically. Usually, morning sickness has lessened or quit completely, you may have more energy, and you may have an increase in appetite. The second trimester is also a time when the fetus is growing rapidly. At the end of the sixth month, the fetus is about 9 inches long and weighs about 1 pounds. You will likely begin to feel the baby move (quickening) between 16 and 20 weeks of pregnancy. Body changes during your second trimester Your body continues to go through many changes during your second trimester. The changes vary from woman to woman. Your weight will continue to increase. You will notice your lower abdomen bulging out. You may begin to get stretch marks on your hips, abdomen, and breasts. You may develop headaches that can be relieved by medicines. The medicines should be approved by your health care provider. You may urinate more often because the fetus is pressing on your bladder. You may develop or continue to have heartburn as a result of your pregnancy. You may develop constipation because certain hormones are causing the muscles that push waste through your intestines to slow down. You may develop hemorrhoids or swollen, bulging veins (varicose veins). You may have back pain. This is caused by: Weight gain. Pregnancy hormones that are relaxing the joints in your pelvis. A shift in weight and the muscles that support your balance. Your breasts will continue to grow and they will continue to become tender. Your gums may bleed and may be sensitive to brushing and flossing. Dark spots or blotches (chloasma, mask of pregnancy) may develop on your face. This will likely fade after the baby is born. A dark line from your belly button to  the pubic area (linea nigra) may appear. This will likely fade after the baby is born. You may have changes in your hair. These can include thickening of your hair, rapid growth, and changes in texture. Some women also have hair loss during or after pregnancy, or hair that feels dry or thin. Your hair will most likely return to normal after your baby is born.  What to expect at prenatal visits During a routine prenatal visit: You will be weighed to make sure you and the fetus are growing normally. Your blood pressure will be taken. Your abdomen will be measured to track your baby's growth. The fetal heartbeat will be listened to. Any test results from the previous visit will be discussed.  Your health care provider may ask you: How you are feeling. If you are feeling the baby move. If you have had any abnormal symptoms, such as leaking fluid, bleeding, severe headaches, or abdominal cramping. If you are using any tobacco products, including cigarettes, chewing tobacco, and electronic cigarettes. If you have any questions.  Other tests that may be performed during   your second trimester include: Blood tests that check for: Low iron levels (anemia). High blood sugar that affects pregnant women (gestational diabetes) between 24 and 28 weeks. Rh antibodies. This is to check for a protein on red blood cells (Rh factor). Urine tests to check for infections, diabetes, or protein in the urine. An ultrasound to confirm the proper growth and development of the baby. An amniocentesis to check for possible genetic problems. Fetal screens for spina bifida and Down syndrome. HIV (human immunodeficiency virus) testing. Routine prenatal testing includes screening for HIV, unless you choose not to have this test.  Follow these instructions at home: Medicines Follow your health care provider's instructions regarding medicine use. Specific medicines may be either safe or unsafe to take during  pregnancy. Take a prenatal vitamin that contains at least 600 micrograms (mcg) of folic acid. If you develop constipation, try taking a stool softener if your health care provider approves. Eating and drinking Eat a balanced diet that includes fresh fruits and vegetables, whole grains, good sources of protein such as meat, eggs, or tofu, and low-fat dairy. Your health care provider will help you determine the amount of weight gain that is right for you. Avoid raw meat and uncooked cheese. These carry germs that can cause birth defects in the baby. If you have low calcium intake from food, talk to your health care provider about whether you should take a daily calcium supplement. Limit foods that are high in fat and processed sugars, such as fried and sweet foods. To prevent constipation: Drink enough fluid to keep your urine clear or pale yellow. Eat foods that are high in fiber, such as fresh fruits and vegetables, whole grains, and beans. Activity Exercise only as directed by your health care provider. Most women can continue their usual exercise routine during pregnancy. Try to exercise for 30 minutes at least 5 days a week. Stop exercising if you experience uterine contractions. Avoid heavy lifting, wear low heel shoes, and practice good posture. A sexual relationship may be continued unless your health care provider directs you otherwise. Relieving pain and discomfort Wear a good support bra to prevent discomfort from breast tenderness. Take warm sitz baths to soothe any pain or discomfort caused by hemorrhoids. Use hemorrhoid cream if your health care provider approves. Rest with your legs elevated if you have leg cramps or low back pain. If you develop varicose veins, wear support hose. Elevate your feet for 15 minutes, 3-4 times a day. Limit salt in your diet. Prenatal Care Write down your questions. Take them to your prenatal visits. Keep all your prenatal visits as told by your health  care provider. This is important. Safety Wear your seat belt at all times when driving. Make a list of emergency phone numbers, including numbers for family, friends, the hospital, and police and fire departments. General instructions Ask your health care provider for a referral to a local prenatal education class. Begin classes no later than the beginning of month 6 of your pregnancy. Ask for help if you have counseling or nutritional needs during pregnancy. Your health care provider can offer advice or refer you to specialists for help with various needs. Do not use hot tubs, steam rooms, or saunas. Do not douche or use tampons or scented sanitary pads. Do not cross your legs for long periods of time. Avoid cat litter boxes and soil used by cats. These carry germs that can cause birth defects in the baby and possibly loss of the   fetus by miscarriage or stillbirth. Avoid all smoking, herbs, alcohol, and unprescribed drugs. Chemicals in these products can affect the formation and growth of the baby. Do not use any products that contain nicotine or tobacco, such as cigarettes and e-cigarettes. If you need help quitting, ask your health care provider. Visit your dentist if you have not gone yet during your pregnancy. Use a soft toothbrush to brush your teeth and be gentle when you floss. Contact a health care provider if: You have dizziness. You have mild pelvic cramps, pelvic pressure, or nagging pain in the abdominal area. You have persistent nausea, vomiting, or diarrhea. You have a bad smelling vaginal discharge. You have pain when you urinate. Get help right away if: You have a fever. You are leaking fluid from your vagina. You have spotting or bleeding from your vagina. You have severe abdominal cramping or pain. You have rapid weight gain or weight loss. You have shortness of breath with chest pain. You notice sudden or extreme swelling of your face, hands, ankles, feet, or legs. You  have not felt your baby move in over an hour. You have severe headaches that do not go away when you take medicine. You have vision changes. Summary The second trimester is from week 14 through week 27 (months 4 through 6). It is also a time when the fetus is growing rapidly. Your body goes through many changes during pregnancy. The changes vary from woman to woman. Avoid all smoking, herbs, alcohol, and unprescribed drugs. These chemicals affect the formation and growth your baby. Do not use any tobacco products, such as cigarettes, chewing tobacco, and e-cigarettes. If you need help quitting, ask your health care provider. Contact your health care provider if you have any questions. Keep all prenatal visits as told by your health care provider. This is important. This information is not intended to replace advice given to you by your health care provider. Make sure you discuss any questions you have with your health care provider. Document Released: 04/20/2001 Document Revised: 10/02/2015 Document Reviewed: 06/27/2012 Elsevier Interactive Patient Education  2017 [employer].

## 2023-06-23 NOTE — Progress Notes (Signed)
LOW-RISK PREGNANCY VISIT Patient name: Stacey Cook MRN [id number]  Date of birth: Apr 19, 1996 Chief Complaint:   Routine Prenatal Visit (2nd IT)  History of Present Illness:   Stacey Cook is a 28 y.o. [phone] female at [redacted]w[redacted]d with an Estimated Date of Delivery: 12/08/23 being seen today for ongoing management of a low-risk pregnancy.   Today she reports  spotting the other day, some vaginal d/c and odor . Contractions: Not present.  .  Movement: Absent. denies leaking of fluid.     05/26/2023   10:38 AM 11/26/2022   10:11 AM 05/15/2020   10:30 AM 11/28/2019    9:56 AM 08/23/2019   11:35 AM  Depression screen PHQ 2/9  Decreased Interest 1 1 2 2 1   Down, Depressed, Hopeless 0 2 3 1 1   PHQ - 2 Score 1 3 5 3 2   Altered sleeping 1 3 1 3 3   Tired, decreased energy 2 3 1 3 3   Change in appetite 3 3 2 1 2   Feeling bad or failure about yourself  0 2 3 1 1   Trouble concentrating 1 3 1 2 1   Moving slowly or fidgety/restless 1 1 1  0 0  Suicidal thoughts 0 0 3 0 0  PHQ-9 Score 9 18 17 13 12   Difficult doing work/chores    Somewhat difficult Somewhat difficult        05/26/2023   10:38 AM 11/26/2022   10:12 AM 05/15/2020   10:33 AM 11/28/2019    9:56 AM  GAD 7 : Generalized Anxiety Score  Nervous, Anxious, on Edge 0 2 1 3   Control/stop worrying 0 3 1 1   Worry too much - different things 0 3 3 1   Trouble relaxing 2 3 1 2   Restless 2 0 0 2  Easily annoyed or irritable 3 3 3 3   Afraid - awful might happen 0 0 3 0  Total GAD 7 Score 7 14 12 12   Anxiety Difficulty    Very difficult      Review of Systems:   Pertinent items are noted in HPI Denies abnormal vaginal discharge w/ itching/odor/irritation, headaches, visual changes, shortness of breath, chest pain, abdominal pain, severe nausea/vomiting, or problems with urination or bowel movements unless otherwise stated above. Pertinent History Reviewed:  Reviewed past medical,surgical, social, obstetrical and family history.  Reviewed  problem list, medications and allergies. Physical Assessment:   Vitals:   06/23/23 1144  BP: 113/66  Pulse: 89  Weight: 201 lb (91.2 kg)  Body mass index is 39.92 kg/m.        Physical Examination:   General appearance: Well appearing, and in no distress  Mental status: Alert, oriented to person, place, and time  Skin: Warm & dry  Cardiovascular: Normal heart rate noted  Respiratory: Normal respiratory effort, no distress  Abdomen: Soft, gravid, nontender  Pelvic: Cervical exam deferred         Extremities: Edema: None  Fetal Status: Fetal Heart Rate (bpm): 148   Movement: Absent    Chaperone: N/A   No results found for this or any previous visit (from the past 24 hours).  Assessment & Plan:  1) Low-risk pregnancy [id number] at [redacted]w[redacted]d with an Estimated Date of Delivery: 12/08/23   2) Prior c/s, then VBAC, wants TOLAC  3) H/O shoulder dystocia, GDM, ICP, PPH  4) Vaginal d/c w/ odor> CV swab   Meds: No orders of the defined types were placed in this encounter.  Labs/procedures today:  2nd IT  Plan:  Continue routine obstetrical care  Next visit: prefers will be in person for u/s     Reviewed: Preterm labor symptoms and general obstetric precautions including but not limited to vaginal bleeding, contractions, leaking of fluid and fetal movement were reviewed in detail with the patient.  All questions were answered. Does have home bp cuff. Office bp cuff given: not applicable. Check bp weekly, let us know if consistently >140 and/or >90.  Follow-up: Return for As scheduled.  Future Appointments  Date Time Provider Department Center  07/20/2023  8:30 AM [facility] None  07/20/2023  9:30 AM Cheral Marker, CNM [facility]    Orders Placed This Encounter  Procedures   US OB Comp + 14 Wk   INTEGRATED 2   Cheral Marker CNM, [facility] 06/23/2023 12:30 PM

## 2023-06-24 LAB — CERVICOVAGINAL ANCILLARY ONLY
Bacterial Vaginitis (gardnerella): POSITIVE — AB
Candida Glabrata: NEGATIVE
Candida Vaginitis: NEGATIVE
Chlamydia: NEGATIVE
Comment: NEGATIVE
Comment: NEGATIVE
Comment: NEGATIVE
Comment: NEGATIVE
Comment: NEGATIVE
Comment: NORMAL
Neisseria Gonorrhea: NEGATIVE
Trichomonas: NEGATIVE

## 2023-06-27 ENCOUNTER — Encounter: Payer: Self-pay | Admitting: Women's Health

## 2023-06-27 LAB — INTEGRATED 2
AFP MoM: 1.15
Alpha-Fetoprotein: 29.2 ng/mL
Crown Rump Length: 57.4 mm
DIA MoM: 1.46
DIA Value: 202.1 pg/mL
Estriol, Unconjugated: 0.88 ng/mL
Gest. Age on Collection Date: 12.1 wk
Gestational Age: 16.1 wk
Maternal Age at EDD: 25.8 a
Nuchal Translucency (NT): 1.5 mm
Nuchal Translucency MoM: 0.93
Number of Fetuses: 1
PAPP-A MoM: 0.71
PAPP-A Value: 433.9 ng/mL
Sonographer ID#: 309760
Test Results:: NEGATIVE
Weight: 201 [lb_av]
Weight: 201 [lb_av]
hCG MoM: 0.68
hCG Value: 19.8 [IU]/mL
uE3 MoM: 1.12

## 2023-06-27 MED ORDER — METRONIDAZOLE 500 MG PO TABS: 500.0000 mg | ORAL_TABLET | Freq: Two times a day (BID) | ORAL | 0 refills | Status: DC

## 2023-06-27 NOTE — Addendum Note (Signed)
Addended by: Cheral Marker on: 06/27/2023 10:31 AM   Modules accepted: Orders

## 2023-06-28 ENCOUNTER — Encounter: Payer: Self-pay | Admitting: Women's Health

## 2023-07-19 ENCOUNTER — Other Ambulatory Visit: Payer: Self-pay | Admitting: Women's Health

## 2023-07-19 DIAGNOSIS — Z3480 Encounter for supervision of other normal pregnancy, unspecified trimester: Secondary | ICD-10-CM

## 2023-07-19 DIAGNOSIS — O09299 Supervision of pregnancy with other poor reproductive or obstetric history, unspecified trimester: Secondary | ICD-10-CM

## 2023-07-19 DIAGNOSIS — Z8719 Personal history of other diseases of the digestive system: Secondary | ICD-10-CM

## 2023-07-19 DIAGNOSIS — O34219 Maternal care for unspecified type scar from previous cesarean delivery: Secondary | ICD-10-CM

## 2023-07-19 DIAGNOSIS — Z363 Encounter for antenatal screening for malformations: Secondary | ICD-10-CM

## 2023-07-19 DIAGNOSIS — Z3482 Encounter for supervision of other normal pregnancy, second trimester: Secondary | ICD-10-CM

## 2023-07-20 ENCOUNTER — Ambulatory Visit (INDEPENDENT_AMBULATORY_CARE_PROVIDER_SITE_OTHER): Payer: Medicaid Other | Admitting: Women's Health

## 2023-07-20 ENCOUNTER — Encounter: Payer: Self-pay | Admitting: Women's Health

## 2023-07-20 ENCOUNTER — Ambulatory Visit: Payer: Medicaid Other | Admitting: Radiology

## 2023-07-20 VITALS — BP 98/65 | HR 84 | Wt 203.4 lb

## 2023-07-20 DIAGNOSIS — O34219 Maternal care for unspecified type scar from previous cesarean delivery: Secondary | ICD-10-CM

## 2023-07-20 DIAGNOSIS — Z3480 Encounter for supervision of other normal pregnancy, unspecified trimester: Secondary | ICD-10-CM

## 2023-07-20 DIAGNOSIS — Z3A19 19 weeks gestation of pregnancy: Secondary | ICD-10-CM

## 2023-07-20 DIAGNOSIS — L299 Pruritus, unspecified: Secondary | ICD-10-CM

## 2023-07-20 DIAGNOSIS — Z363 Encounter for antenatal screening for malformations: Secondary | ICD-10-CM | POA: Diagnosis not present

## 2023-07-20 DIAGNOSIS — Z8719 Personal history of other diseases of the digestive system: Secondary | ICD-10-CM

## 2023-07-20 DIAGNOSIS — O09299 Supervision of pregnancy with other poor reproductive or obstetric history, unspecified trimester: Secondary | ICD-10-CM

## 2023-07-20 DIAGNOSIS — Z3482 Encounter for supervision of other normal pregnancy, second trimester: Secondary | ICD-10-CM | POA: Diagnosis not present

## 2023-07-20 DIAGNOSIS — O09292 Supervision of pregnancy with other poor reproductive or obstetric history, second trimester: Secondary | ICD-10-CM | POA: Diagnosis not present

## 2023-07-20 DIAGNOSIS — Z8759 Personal history of other complications of pregnancy, childbirth and the puerperium: Secondary | ICD-10-CM

## 2023-07-20 NOTE — Progress Notes (Signed)
 LOW-RISK PREGNANCY VISIT Patient name: Stacey Cook MRN [id number]  Date of birth: [birth year] Chief Complaint:   Routine Prenatal Visit and Pregnancy Ultrasound  History of Present Illness:   Stacey Cook is a 28 y.o. [id number] female at [redacted]w[redacted]d with an Estimated Date of Delivery: 12/08/23 being seen today for ongoing management of a low-risk pregnancy.   Today she reports  itching belly and feet, worse at night. Bumps on arms, hands red. Congested at night. Headaches- getting better though. Can't eat a lot.  . Contractions: Not present.  .  Movement: Present. denies leaking of fluid.     05/26/2023   10:38 AM 11/26/2022   10:11 AM 05/15/2020   10:30 AM 11/28/2019    9:56 AM 08/23/2019   11:35 AM  Depression screen PHQ 2/9  Decreased Interest 1 1 2 2 1   Down, Depressed, Hopeless 0 2 3 1 1   PHQ - 2 Score 1 3 5 3 2   Altered sleeping 1 3 1 3 3   Tired, decreased energy 2 3 1 3 3   Change in appetite 3 3 2 1 2   Feeling bad or failure about yourself  0 2 3 1 1   Trouble concentrating 1 3 1 2 1   Moving slowly or fidgety/restless 1 1 1  0 0  Suicidal thoughts 0 0 3 0 0  PHQ-9 Score 9 18 17 13 12   Difficult doing work/chores    Somewhat difficult Somewhat difficult        05/26/2023   10:38 AM 11/26/2022   10:12 AM 05/15/2020   10:33 AM 11/28/2019    9:56 AM  GAD 7 : Generalized Anxiety Score  Nervous, Anxious, on Edge 0 2 1 3   Control/stop worrying 0 3 1 1   Worry too much - different things 0 3 3 1   Trouble relaxing 2 3 1 2   Restless 2 0 0 2  Easily annoyed or irritable 3 3 3 3   Afraid - awful might happen 0 0 3 0  Total GAD 7 Score 7 14 12 12   Anxiety Difficulty    Very difficult      Review of Systems:   Pertinent items are noted in HPI Denies abnormal vaginal discharge w/ itching/odor/irritation, headaches, visual changes, shortness of breath, chest pain, abdominal pain, severe nausea/vomiting, or problems with urination or bowel movements unless otherwise stated above. Pertinent  History Reviewed:  Reviewed past medical,surgical, social, obstetrical and family history.  Reviewed problem list, medications and allergies. Physical Assessment:   Vitals:   07/20/23 0922  BP: 98/65  Pulse: 84  Weight: 203 lb 6.4 oz (92.3 kg)  Body mass index is 40.39 kg/m.        Physical Examination:   General appearance: Well appearing, and in no distress  Mental status: Alert, oriented to person, place, and time  Skin: Warm & dry  Cardiovascular: Normal heart rate noted  Respiratory: Normal respiratory effort, no distress  Abdomen: Soft, gravid, nontender  Pelvic: Cervical exam deferred         Extremities: Edema: Trace  Fetal Status:     Movement: Present   [name]: GA = 19+6 weeks Single active female fetus, variable positions, FHR = 153 bpm  Fundal anterior pl, gr1 EFW 66% 341g, MVP = 3.65 cm  nl Rt ov - L ov not seen - CL = 4.4 cm,  closed Anatomy screen completed, poor image quality due to maternal habitus, no apparent abn  Chaperone: N/A No results found for this  or any previous visit (from the past 24 hours).  Assessment & Plan:  1) Low-risk pregnancy [id number] at [redacted]w[redacted]d with an Estimated Date of Delivery: 12/08/23   2) Itching belly/feet, worse at night w/ h/o ICP, labs today (reports she has been fasting)  3) Headaches> gave printed prevention/relief measures   4) Can't eat a lot> try small frequent snacks instead of big meals  5) Congestion at night> can use decongestant +/- humidifier  6) H/O shoulder dystocia> EFW 36w  7) H/O C/S then VBAC, wants TOLAC   Meds: No orders of the defined types were placed in this encounter.  Labs/procedures today: U/S and ICP labs  Plan:  Continue routine obstetrical care  Next visit: prefers in person    Reviewed: Preterm labor symptoms and general obstetric precautions including but not limited to vaginal bleeding, contractions, leaking of fluid and fetal movement were reviewed in detail with the patient.  All questions were  answered. Does have home bp cuff. Office bp cuff given: not applicable. Check bp weekly, let us know if consistently >140 and/or >90.  Follow-up: Return in about 4 weeks (around 08/17/2023) for LROB, CNM, in person.  Future Appointments  Date Time Provider Department Center  08/17/2023  9:10 AM Sue Lush, FNP [facility]    Orders Placed This Encounter  Procedures   Comprehensive metabolic panel   Bile acids, total   Cheral Marker CNM, [facility] 07/20/2023 10:03 AM

## 2023-07-20 NOTE — Patient Instructions (Addendum)
 Stacey Cook, thank you for choosing our office today! We appreciate the opportunity to meet your healthcare needs. You may receive a short survey by mail, e-mail, or through [employer]. If you are happy with your care we would appreciate if you could take just a few minutes to complete the survey questions. We read all of your comments and take your feedback very seriously. Thank you again for choosing our office.  Center for [employer] Team at [facility] at [facility] ([address] [place], [place] [address]) Entrance C, located off of [address] 24/7 valet parking   For itching:  Avoid hot showers/baths, take cool/luke-warm showers/baths instead Cool washcloths to itchy areas Aquaphor or Eucerin creams to moisturize the skin Hydrocortisone or Benadryl cream, or Benadryl by mouth for the itching Oatmeal baths  Go to [email or web address] to register for FREE online childbirth classes  Call the office [phone]) or go to [facility] if: You begin to severe cramping Your water breaks.  Sometimes it is a big gush of fluid, sometimes it is just a trickle that keeps getting your panties wet or running down your legs You have vaginal bleeding.  It is normal to have a small amount of spotting if your cervix was checked.   [facility] Pediatricians/Family Doctors [facility]): [address] Colette Ribas, [phone]           [facility]: [address], [phone]                [facility]): [address], [phone] (call to ask if accepting patients) [facility] Department: [address], [place], [phone]    [facility] Pediatricians/Family Doctors [facility]): [phone] [address], [phone] [facility]: [address] [place], [phone] [facility]: [address], [phone]  [facility] Doctors   [facility]): [phone] [facility]: [address] [phone]   [facility] Doctors [facility]: [address] [phone]  [facility] Doctors  [facility]: [phone], [phone]  Home Blood Pressure Monitoring for Patients   Your provider has recommended that you check your blood pressure (BP) at least once a week at home. If you do not have a blood pressure cuff at home, one will be provided for you. Contact your provider if you have not received your monitor within 1 week.   Helpful Tips for Accurate Home Blood Pressure Checks  Don't smoke, exercise, or drink caffeine 30 minutes before checking your BP Use the restroom before checking your BP (a full bladder can raise your pressure) Relax in a comfortable upright chair Feet on the ground Left arm resting comfortably on a flat surface at the level of your heart Legs uncrossed Back supported Sit quietly and don't talk Place the cuff on your bare arm Adjust snuggly, so that only two fingertips can fit between your skin and the top of the cuff Check 2 readings separated by at least one minute Keep a log of your BP readings For a visual, please reference this diagram: http://ccnc.care/bpdiagram  Provider Name: [facility]     Phone: [phone]  Zone 1: ALL CLEAR  Continue to monitor your symptoms:  BP reading is less than 140 (top number) or less than 90 (bottom number)  No right upper stomach pain No headaches or seeing  spots No feeling nauseated or throwing up No swelling in face and hands  Zone 2: CAUTION Call your doctor's office for any of the following:  BP reading is greater than 140 (top number) or greater than 90 (bottom number)  Stomach pain under your ribs in the middle or right side Headaches or seeing spots Feeling nauseated or throwing up Swelling in face and hands  Zone 3: EMERGENCY  Seek  immediate medical care if you have any of the following:  BP reading is greater than160 (top number) or greater than 110 (bottom number) Severe headaches not improving with Tylenol Serious difficulty catching your breath Any worsening symptoms from Zone 2     Second Trimester of Pregnancy The second trimester is from week 14 through week 27 (months 4 through 6). The second trimester is often a time when you feel your best. Your body has adjusted to being pregnant, and you begin to feel better physically. Usually, morning sickness has lessened or quit completely, you may have more energy, and you may have an increase in appetite. The second trimester is also a time when the fetus is growing rapidly. At the end of the sixth month, the fetus is about 9 inches long and weighs about 1 pounds. You will likely begin to feel the baby move (quickening) between 16 and 20 weeks of pregnancy. Body changes during your second trimester Your body continues to go through many changes during your second trimester. The changes vary from woman to woman. Your weight will continue to increase. You will notice your lower abdomen bulging out. You may begin to get stretch marks on your hips, abdomen, and breasts. You may develop headaches that can be relieved by medicines. The medicines should be approved by your health care provider. You may urinate more often because the fetus is pressing on your bladder. You may develop or continue to have heartburn as a result of your pregnancy. You may develop constipation because certain hormones are causing the muscles that push waste through your intestines to slow down. You may develop hemorrhoids or swollen, bulging veins (varicose veins). You may have back pain. This is caused by: Weight gain. Pregnancy hormones that are relaxing the joints in your pelvis. A shift in weight and the muscles that support your balance. Your breasts will continue to grow and they will continue  to become tender. Your gums may bleed and may be sensitive to brushing and flossing. Dark spots or blotches (chloasma, mask of pregnancy) may develop on your face. This will likely fade after the baby is born. A dark line from your belly button to the pubic area (linea nigra) may appear. This will likely fade after the baby is born. You may have changes in your hair. These can include thickening of your hair, rapid growth, and changes in texture. Some women also have hair loss during or after pregnancy, or hair that feels dry or thin. Your hair will most likely return to normal after your baby is born.  What to expect at prenatal visits During a routine prenatal visit: You will be weighed to make sure you and the fetus are growing normally. Your blood pressure will be taken. Your abdomen will be measured to track your baby's growth. The fetal heartbeat will be listened to. Any test results from the previous visit will be discussed.  Your health care provider may ask you: How you are feeling. If you are feeling the baby move. If you have had any abnormal  symptoms, such as leaking fluid, bleeding, severe headaches, or abdominal cramping. If you are using any tobacco products, including cigarettes, chewing tobacco, and electronic cigarettes. If you have any questions.  Other tests that may be performed during your second trimester include: Blood tests that check for: Low iron levels (anemia). High blood sugar that affects pregnant women (gestational diabetes) between 86 and 28 weeks. Rh antibodies. This is to check for a protein on red blood cells (Rh factor). Urine tests to check for infections, diabetes, or protein in the urine. An ultrasound to confirm the proper growth and development of the baby. An amniocentesis to check for possible genetic problems. Fetal screens for spina bifida and Down syndrome. HIV (human immunodeficiency virus) testing. Routine prenatal testing includes  screening for HIV, unless you choose not to have this test.  Follow these instructions at home: Medicines Follow your health care provider's instructions regarding medicine use. Specific medicines may be either safe or unsafe to take during pregnancy. Take a prenatal vitamin that contains at least 600 micrograms (mcg) of folic acid. If you develop constipation, try taking a stool softener if your health care provider approves. Eating and drinking Eat a balanced diet that includes fresh fruits and vegetables, whole grains, good sources of protein such as meat, eggs, or tofu, and low-fat dairy. Your health care provider will help you determine the amount of weight gain that is right for you. Avoid raw meat and uncooked cheese. These carry germs that can cause birth defects in the baby. If you have low calcium intake from food, talk to your health care provider about whether you should take a daily calcium supplement. Limit foods that are high in fat and processed sugars, such as fried and sweet foods. To prevent constipation: Drink enough fluid to keep your urine clear or pale yellow. Eat foods that are high in fiber, such as fresh fruits and vegetables, whole grains, and beans. Activity Exercise only as directed by your health care provider. Most women can continue their usual exercise routine during pregnancy. Try to exercise for 30 minutes at least 5 days a week. Stop exercising if you experience uterine contractions. Avoid heavy lifting, wear low heel shoes, and practice good posture. A sexual relationship may be continued unless your health care provider directs you otherwise. Relieving pain and discomfort Wear a good support bra to prevent discomfort from breast tenderness. Take warm sitz baths to soothe any pain or discomfort caused by hemorrhoids. Use hemorrhoid cream if your health care provider approves. Rest with your legs elevated if you have leg cramps or low back pain. If you  develop varicose veins, wear support hose. Elevate your feet for 15 minutes, 3-4 times a day. Limit salt in your diet. Prenatal Care Write down your questions. Take them to your prenatal visits. Keep all your prenatal visits as told by your health care provider. This is important. Safety Wear your seat belt at all times when driving. Make a list of emergency phone numbers, including numbers for family, friends, the hospital, and police and fire departments. General instructions Ask your health care provider for a referral to a local prenatal education class. Begin classes no later than the beginning of month 6 of your pregnancy. Ask for help if you have counseling or nutritional needs during pregnancy. Your health care provider can offer advice or refer you to specialists for help with various needs. Do not use hot tubs, steam rooms, or saunas. Do not douche or use tampons or  scented sanitary pads. Do not cross your legs for long periods of time. Avoid cat litter boxes and soil used by cats. These carry germs that can cause birth defects in the baby and possibly loss of the fetus by miscarriage or stillbirth. Avoid all smoking, herbs, alcohol, and unprescribed drugs. Chemicals in these products can affect the formation and growth of the baby. Do not use any products that contain nicotine or tobacco, such as cigarettes and e-cigarettes. If you need help quitting, ask your health care provider. Visit your dentist if you have not gone yet during your pregnancy. Use a soft toothbrush to brush your teeth and be gentle when you floss. Contact a health care provider if: You have dizziness. You have mild pelvic cramps, pelvic pressure, or nagging pain in the abdominal area. You have persistent nausea, vomiting, or diarrhea. You have a bad smelling vaginal discharge. You have pain when you urinate. Get help right away if: You have a fever. You are leaking fluid from your vagina. You have spotting or  bleeding from your vagina. You have severe abdominal cramping or pain. You have rapid weight gain or weight loss. You have shortness of breath with chest pain. You notice sudden or extreme swelling of your face, hands, ankles, feet, or legs. You have not felt your baby move in over an hour. You have severe headaches that do not go away when you take medicine. You have vision changes. Summary The second trimester is from week 14 through week 27 (months 4 through 6). It is also a time when the fetus is growing rapidly. Your body goes through many changes during pregnancy. The changes vary from woman to woman. Avoid all smoking, herbs, alcohol, and unprescribed drugs. These chemicals affect the formation and growth your baby. Do not use any tobacco products, such as cigarettes, chewing tobacco, and e-cigarettes. If you need help quitting, ask your health care provider. Contact your health care provider if you have any questions. Keep all prenatal visits as told by your health care provider. This is important. This information is not intended to replace advice given to you by your health care provider. Make sure you discuss any questions you have with your health care provider. Document Released: 04/20/2001 Document Revised: 10/02/2015 Document Reviewed: 06/27/2012 Elsevier Interactive Patient Education  2017 [employer]   For Headaches:  Stay well hydrated, drink enough water so that your urine is clear, sometimes if you are dehydrated you can get headaches Eat small frequent meals and snacks, sometimes if you are hungry you can get headaches Sometimes you get headaches during pregnancy from the pregnancy hormones You can try tylenol (1-2 regular strength 325mg  or 1-2 extra strength 500mg ) as directed on the box. The least amount of medication that works is best.  Cool compresses (cool wet washcloth or ice pack) to area of head that is hurting You can also try drinking a caffeinated drink to  see if this will help If not helping, try below:  For Prevention of Headaches/Migraines: CoQ10 100mg  three times daily Vitamin B2 400mg  daily Magnesium Oxide 400-600mg  daily  Foods to alleviate migraines:  1) dark leafy greens 2) avocado 3) tuna 4) salmon  5) beans and legumes  Foods to avoid: 1) Excessive (or irregular timing) coffee 3) aged cheeses 4) chocolate 5) citrus fruits 6) aspartame and other artifical sweeteners 7) yeast 8) MSG (in processed foods) 9) processed and cured meats 10) nuts and certain seeds 11) chicken livers and other organ meats  12) dairy products like buttermilk, sour cream, and yogurt 13) dried fruits like dates, figs, and raisins 14) garlic 15) onions 16) potato chips 17) pickled foods like olives and sauerkraut 18) some fresh fruits like ripe banana, papaya, red plums, raspberries, kiwi, pineapple 19) tomato-based products  Recommend to keep a migraine diary: rate daily the severity of your headache (1-10) and what foods you eat that day to help determine patterns.   If You Get a Bad Headache/Migraine: Benadryl 25mg   Magnesium Oxide 1 large Gatorade 2 extra strength Tylenol (1,000mg  total) 1 cup coffee or Coke      If this doesn't help please call us @ [phone]

## 2023-07-20 NOTE — Progress Notes (Signed)
 [name]: GA = 19+6 weeks Single active female fetus, variable positions, FHR = 153 bpm  Fundal anterior pl, gr1 EFW 66% 341g, MVP = 3.65 cm  nl Rt ov - L ov not seen - CL = 4.4 cm,  closed Anatomy screen completed, poor image quality due to maternal habitus, no apparent abn

## 2023-07-21 ENCOUNTER — Other Ambulatory Visit: Payer: Medicaid Other

## 2023-07-22 LAB — COMPREHENSIVE METABOLIC PANEL
ALT: 15 IU/L (ref 0–32)
AST: 16 IU/L (ref 0–40)
Albumin: 3.9 g/dL — ABNORMAL LOW (ref 4.0–5.0)
Alkaline Phosphatase: 105 IU/L (ref 44–121)
BUN/Creatinine Ratio: 10 (ref 9–23)
BUN: 5 mg/dL — ABNORMAL LOW (ref 6–20)
Bilirubin Total: <0.2 mg/dL (ref 0.0–1.2)
CO2: 19 mmol/L — ABNORMAL LOW (ref 20–29)
Calcium: 9.2 mg/dL (ref 8.7–10.2)
Chloride: 102 mmol/L (ref 96–106)
Creatinine, Ser: 0.49 mg/dL — ABNORMAL LOW (ref 0.57–1.00)
Globulin, Total: 2.8 g/dL (ref 1.5–4.5)
Glucose: 85 mg/dL (ref 70–99)
Potassium: 4.3 mmol/L (ref 3.5–5.2)
Sodium: 137 mmol/L (ref 134–144)
Total Protein: 6.7 g/dL (ref 6.0–8.5)
eGFR: 134 mL/min/{1.73_m2} (ref >59)

## 2023-07-22 LAB — BILE ACIDS, TOTAL

## 2023-07-28 ENCOUNTER — Emergency Department (HOSPITAL_COMMUNITY)
Admission: EM | Admit: 2023-07-28 | Discharge: 2023-07-28 | Disposition: A | Discharge status: Home / Self Care | Attending: Emergency Medicine | Admitting: Emergency Medicine

## 2023-07-28 ENCOUNTER — Telehealth: Payer: Self-pay | Admitting: *Deleted

## 2023-07-28 ENCOUNTER — Encounter (HOSPITAL_COMMUNITY): Payer: Self-pay | Admitting: Emergency Medicine

## 2023-07-28 ENCOUNTER — Other Ambulatory Visit: Payer: Self-pay

## 2023-07-28 DIAGNOSIS — O0001 Abdominal pregnancy with intrauterine pregnancy: Secondary | ICD-10-CM | POA: Insufficient documentation

## 2023-07-28 DIAGNOSIS — E860 Dehydration: Secondary | ICD-10-CM | POA: Diagnosis not present

## 2023-07-28 DIAGNOSIS — Z3A21 21 weeks gestation of pregnancy: Secondary | ICD-10-CM | POA: Diagnosis not present

## 2023-07-28 DIAGNOSIS — R1010 Upper abdominal pain, unspecified: Secondary | ICD-10-CM

## 2023-07-28 DIAGNOSIS — O211 Hyperemesis gravidarum with metabolic disturbance: Secondary | ICD-10-CM | POA: Insufficient documentation

## 2023-07-28 DIAGNOSIS — O26892 Other specified pregnancy related conditions, second trimester: Secondary | ICD-10-CM | POA: Diagnosis present

## 2023-07-28 DIAGNOSIS — Z331 Pregnant state, incidental: Secondary | ICD-10-CM

## 2023-07-28 DIAGNOSIS — R1084 Generalized abdominal pain: Secondary | ICD-10-CM | POA: Insufficient documentation

## 2023-07-28 LAB — COMPREHENSIVE METABOLIC PANEL
ALT: 28 U/L (ref 0–44)
AST: 24 U/L (ref 15–41)
Albumin: 3.3 g/dL — ABNORMAL LOW (ref 3.5–5.0)
Alkaline Phosphatase: 99 U/L (ref 38–126)
Anion gap: 10 (ref 5–15)
BUN: 7 mg/dL (ref 6–20)
CO2: 20 mmol/L — ABNORMAL LOW (ref 22–32)
Calcium: 9.1 mg/dL (ref 8.9–10.3)
Chloride: 102 mmol/L (ref 98–111)
Creatinine, Ser: 0.42 mg/dL — ABNORMAL LOW (ref 0.44–1.00)
GFR, Estimated: >60 mL/min (ref >60)
Glucose, Bld: 88 mg/dL (ref 70–99)
Potassium: 3.7 mmol/L (ref 3.5–5.1)
Sodium: 132 mmol/L — ABNORMAL LOW (ref 135–145)
Total Bilirubin: 0.5 mg/dL (ref 0.0–1.2)
Total Protein: 7 g/dL (ref 6.5–8.1)

## 2023-07-28 LAB — URINALYSIS, ROUTINE W REFLEX MICROSCOPIC
Bilirubin Urine: NEGATIVE
Glucose, UA: NEGATIVE mg/dL
Hgb urine dipstick: NEGATIVE
Ketones, ur: 80 mg/dL — AB
Nitrite: NEGATIVE
Protein, ur: 30 mg/dL — AB
Specific Gravity, Urine: 1.024 (ref 1.005–1.030)
pH: 6 (ref 5.0–8.0)

## 2023-07-28 LAB — CBC
HCT: 35.9 % — ABNORMAL LOW (ref 36.0–46.0)
Hemoglobin: 12.5 g/dL (ref 12.0–15.0)
MCH: 31.2 pg (ref 26.0–34.0)
MCHC: 34.8 g/dL (ref 30.0–36.0)
MCV: 89.5 fL (ref 80.0–100.0)
Platelets: 166 10*3/uL (ref 150–400)
RBC: 4.01 MIL/uL (ref 3.87–5.11)
RDW: 13.8 % (ref 11.5–15.5)
WBC: 10.1 10*3/uL (ref 4.0–10.5)
nRBC: 0 % (ref 0.0–0.2)

## 2023-07-28 LAB — LIPASE, BLOOD: Lipase: 33 U/L (ref 11–51)

## 2023-07-28 LAB — POC URINE PREG, ED: Preg Test, Ur: POSITIVE — AB

## 2023-07-28 MED ORDER — AMOXICILLIN 500 MG PO CAPS: 500.0000 mg | ORAL_CAPSULE | Freq: Three times a day (TID) | ORAL | 0 refills | Status: DC

## 2023-07-28 MED ORDER — ACETAMINOPHEN 325 MG PO TABS
650.0000 mg | ORAL_TABLET | Freq: Once | ORAL | Status: AC
  Administered 2023-07-28: 650 mg via ORAL
  Filled 2023-07-28: qty 2

## 2023-07-28 MED ORDER — SODIUM CHLORIDE 0.9 % IV BOLUS
1000.0000 mL | Freq: Once | INTRAVENOUS | Status: AC
  Administered 2023-07-28: 1000 mL via INTRAVENOUS

## 2023-07-28 NOTE — ED Notes (Signed)
 OB nurse states that they are going to clear her from an OB stand point as they did not see any contractions on her monitoring and that we should continue to work her up from an ED stand point. They ask that we stress to her to come to the [facility] for care. She states that we can take her off the monitor. ED provider made aware.

## 2023-07-28 NOTE — ED Notes (Signed)
 This RN and Art therapist called and placed pt on OB rapid response nurse monitoring at this time.

## 2023-07-28 NOTE — Telephone Encounter (Signed)
 Pt was diagnosed with noravirus last week and was having contraction like cramps. She is still having cramps. Pt is "doubled over in pain". Baby is moving. Pt is urinating "more than usual". Pt was advised to go to [facility] for evaluation. Pt voiced understanding. JSY

## 2023-07-28 NOTE — Discharge Instructions (Signed)
Follow-up with your OB/GYN doctor next week for recheck 

## 2023-07-28 NOTE — Progress Notes (Signed)
 Received call from [facility] for patient presenting "Pt bib pov w/ c/o of abdominal pain X 4 days with severe pain today. Pt is reporting intermittent severe pain and nausea. 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 1 was an ectopic pregnancy, 1 was 4 weeks, and the last was 14 weeks. Pt also reports norovirus last week. Pt called her OB and was instructed to report to the ED due to unavailability in their schedule. "  Patient verbally denies any vaginal bleeding or leaking of fluid. [facility] was able to obtain FHT, FHR 145.   Patient reports feeling contractions about every 10 minutes. [facility] staff reports being present for 2 contractions about 6-7 minutes apart. Patient received a liter of fluid   OB Attending reviewed FHT, patient is cleared from an OB standpoint.   Spoke with [facility] staff, advised above. Patient is to come to [facility] for any future concerns.   Lovenia Shuck, RN RROB

## 2023-07-28 NOTE — ED Triage Notes (Signed)
 Pt bib pov w/ c/o of abdominal pain X 4 days with severe pain today. Pt is reporting intermittent severe pain and nausea. 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 1 was an ectopic pregnancy, 1 was 4 weeks, and the last was 14 weeks. Pt also reports norovirus last week. Pt called her OB and was instructed to report to the ED due to unavailability in their schedule.

## 2023-07-28 NOTE — ED Notes (Signed)
 Spoke with OB nurse who states that it is not likely that we will get an accurate reading on the [facility] monitor because of how early she in her gestation. Monitor adjusted per OB nurse recommendations.

## 2023-07-29 NOTE — ED Provider Notes (Signed)
 [place] EMERGENCY DEPARTMENT AT [facility] Provider Note   CSN: [id number] Arrival date & time: 07/28/23  1849     History  Chief Complaint  Patient presents with   Abdominal Pain    [redacted] weeks pregnant     Stacey Cook is a 28 y.o. female.  Patient is [redacted] weeks pregnant and having abdominal cramping.  Patient states that she has had 3 miscarriages and has 3 full-term pregnancies.  Patient not having any bleeding  The history is provided by the patient and medical records. No language interpreter was used.  Abdominal Pain Pain location:  Generalized Pain quality: aching   Pain radiates to:  Does not radiate Pain severity:  Moderate Onset quality:  Sudden Timing:  Intermittent Progression:  Waxing and waning Chronicity:  New Context: not alcohol use   Associated symptoms: no chest pain, no cough, no diarrhea, no fatigue and no hematuria        Home Medications Prior to Admission medications   Medication Sig Start Date End Date Taking? Authorizing Provider  amoxicillin (AMOXIL) 500 MG capsule Take 1 capsule (500 mg total) by mouth 3 (three) times daily. 07/28/23  Yes Bethann Berkshire, MD  ondansetron (ZOFRAN-ODT) 4 MG disintegrating tablet Take 4 mg by mouth every 6 (six) hours as needed. 07/21/23  Yes [provider]  albuterol (PROVENTIL) (2.5 MG/3ML) 0.083% nebulizer solution Take 2.5 mg by nebulization See admin instructions. 1 vial via nebulizer every 6 to 8 hours as needed for shortness of breath 01/29/20   [provider]  albuterol (VENTOLIN HFA) 108 (90 Base) MCG/ACT inhaler Inhale 1-2 puffs into the lungs every 6 (six) hours as needed for wheezing or shortness of breath. 03/13/21   Particia Nearing, PA-C  Blood Pressure Monitor MISC For regular home bp monitoring during pregnancy 05/26/23   Cheral Marker, CNM  Doxylamine-Pyridoxine (DICLEGIS) 10-10 MG TBEC 2 tabs q hs, if sx persist add 1 tab q am on day 3, if sx persist add 1 tab  q afternoon on day 4 05/26/23   Cheral Marker, CNM  metroNIDAZOLE (FLAGYL) 500 MG tablet Take 1 tablet (500 mg total) by mouth 2 (two) times daily. Patient not taking: Reported on 07/20/2023 06/27/23   Cheral Marker, CNM  ondansetron (ZOFRAN) 4 MG tablet Take 1 tablet (4 mg total) by mouth every 8 (eight) hours as needed. 04/12/23   Adline Potter, NP  prenatal vitamin w/FE, FA (PRENATAL 1 + 1) 27-1 MG TABS tablet Take 1 tablet by mouth daily at 12 noon. 04/12/23   Adline Potter, NP  promethazine (PHENERGAN) 25 MG tablet Take 1 tablet (25 mg total) by mouth every 6 (six) hours as needed for nausea or vomiting. 05/20/23   Adline Potter, NP      Allergies    Cat dander, Dilaudid [hydromorphone], Keflex [cephalexin], Other, and Adhesive [tape]    Review of Systems   Review of Systems  Constitutional:  Negative for appetite change and fatigue.  HENT:  Negative for congestion, ear discharge and sinus pressure.   Eyes:  Negative for discharge.  Respiratory:  Negative for cough.   Cardiovascular:  Negative for chest pain.  Gastrointestinal:  Positive for abdominal pain. Negative for diarrhea.  Genitourinary:  Negative for frequency and hematuria.  Musculoskeletal:  Negative for back pain.  Skin:  Negative for rash.  Neurological:  Negative for seizures and headaches.  Psychiatric/Behavioral:  Negative for hallucinations.     Physical  Exam Updated Vital Signs BP 104/60   Pulse 80   Temp 98.3 F (36.8 C) (Oral)   Resp 18   Ht 4\' 11"  (1.499 m)   Wt 90 kg   LMP 02/14/2023 Comment: by early u/s  SpO2 97%   BMI 40.09 kg/m  Physical Exam Vitals and nursing note reviewed.  Constitutional:      Appearance: She is well-developed.  HENT:     Head: Normocephalic.     Nose: Nose normal.  Eyes:     General: No scleral icterus.    Conjunctiva/sclera: Conjunctivae normal.  Neck:     Thyroid: No thyromegaly.  Cardiovascular:     Rate and Rhythm: Normal rate and  regular rhythm.     Heart sounds: No murmur heard.    No friction rub. No gallop.  Pulmonary:     Breath sounds: No stridor. No wheezing or rales.  Chest:     Chest wall: No tenderness.  Abdominal:     General: There is no distension.     Tenderness: There is abdominal tenderness. There is no rebound.  Genitourinary:    Comments: Bimanual exam done and patient has a closed cervix no bleeding seen Musculoskeletal:        General: Normal range of motion.     Cervical back: Neck supple.  Lymphadenopathy:     Cervical: No cervical adenopathy.  Skin:    Findings: No erythema or rash.  Neurological:     Mental Status: She is alert and oriented to person, place, and time.     Motor: No abnormal muscle tone.     Coordination: Coordination normal.  Psychiatric:        Behavior: Behavior normal.     ED Results / Procedures / Treatments   Labs (all labs ordered are listed, but only abnormal results are displayed) Labs Reviewed  COMPREHENSIVE METABOLIC PANEL - Abnormal; Notable for the following components:      Result Value   Sodium 132 (*)    CO2 20 (*)    Creatinine, Ser 0.42 (*)    Albumin 3.3 (*)    All other components within normal limits  CBC - Abnormal; Notable for the following components:   HCT 35.9 (*)    All other components within normal limits  URINALYSIS, ROUTINE W REFLEX MICROSCOPIC - Abnormal; Notable for the following components:   APPearance HAZY (*)    Ketones, ur 80 (*)    Protein, ur 30 (*)    Leukocytes,Ua SMALL (*)    Bacteria, UA RARE (*)    All other components within normal limits  POC URINE PREG, ED - Abnormal; Notable for the following components:   Preg Test, Ur POSITIVE (*)    All other components within normal limits  URINE CULTURE  LIPASE, BLOOD    EKG None  Radiology No results found.  Procedures Procedures    Medications Ordered in ED Medications  sodium chloride 0.9 % bolus 1,000 mL (0 mLs Intravenous Stopped 07/28/23 2209)   acetaminophen (TYLENOL) tablet 650 mg (650 mg Oral Given 07/28/23 2148)    ED Course/ Medical Decision Making/ A&P                                 Medical Decision Making Amount and/or Complexity of Data Reviewed Labs: ordered.  Risk OTC drugs. Prescription drug management.   Patient with abdominal pain and irritable uterus during pregnancy.  She improved with IV fluids.  We will culture her urine and she will follow-up with her OB/GYN doctor        Final Clinical Impression(s) / ED Diagnoses Final diagnoses:  Pain of upper abdomen  Dehydration  IUP (intrauterine pregnancy), incidental    Rx / DC Orders ED Discharge Orders          Ordered    amoxicillin (AMOXIL) 500 MG capsule  3 times daily        07/28/23 2245              Bethann Berkshire, MD 07/29/23 1428

## 2023-07-31 LAB — URINE CULTURE

## 2023-08-01 ENCOUNTER — Telehealth (HOSPITAL_BASED_OUTPATIENT_CLINIC_OR_DEPARTMENT_OTHER): Payer: Self-pay

## 2023-08-01 NOTE — Telephone Encounter (Signed)
 Post ED Visit - Positive Culture Follow-up  Culture report reviewed by antimicrobial stewardship pharmacist: [facility] [x]  Lennie Muckle, Pharm.D. []  Celedonio Miyamoto, Pharm.D., BCPS AQ-ID []  Garvin Fila, Pharm.D., BCPS []  Georgina Pillion, [address].D., BCPS []  Candelaria Arenas, [address].D., BCPS, AAHIVP []  Estella Husk, Pharm.D., BCPS, AAHIVP []  Lysle Pearl, PharmD, BCPS []  Phillips Climes, PharmD, BCPS []  Agapito Games, PharmD, BCPS []  Verlan Friends, PharmD []  Mervyn Gay, PharmD, BCPS []  Vinnie Level, PharmD  [facility] []  Len Childs, PharmD []  Greer Pickerel, PharmD []  Adalberto Cole, PharmD []  Perlie Gold, Rph []  Lonell Face) Jean Rosenthal, PharmD []  Earl Many, PharmD []  Junita Push, PharmD []  Dorna Leitz, PharmD []  Terrilee Files, PharmD []  Lynann Beaver, PharmD []  Keturah Barre, PharmD []  Loralee Pacas, PharmD []  Bernadene Person, PharmD   Positive urine culture Treated with amoxicillin, organism sensitive to the same and no further patient follow-up is required at this time.  Sandria Senter 08/01/2023, 9:25 AM

## 2023-08-01 NOTE — Progress Notes (Signed)
 ED Antimicrobial Stewardship Positive Culture Follow Up   Stacey Cook is an 28 y.o. female who presented to [facility] on 07/28/2023 with a chief complaint of  Chief Complaint  Patient presents with   Abdominal Pain    [redacted] weeks pregnant     Recent Results (from the past 720 hours)  Urine Culture     Status: Abnormal   Collection Time: 07/28/23  7:23 PM   Specimen: Urine, Clean Catch  Result Value Ref Range Status   Specimen Description   Final    URINE, CLEAN CATCH Performed at [facility], [address], [place], [place] [address]    Special Requests   Final    NONE Performed at [facility], [address], [place], [place] [address]    Culture (A)  Final    20,000 COLONIES/mL DIPHTHEROIDS(CORYNEBACTERIUM SPECIES) Standardized susceptibility testing for this organism is not available. Performed at [facility], [address] [place], [place] [address]    Report Status 07/31/2023 FINAL  Final    [x]  Treated with amoxicilin, patient can continue treatment for the remainder of her course  ED Provider: Jannifer Hick, PA-C   Lennie Muckle, PharmD PGY1 Pharmacy Resident 08/01/2023 9:25 AM Monday - Friday phone -  [phone] Saturday - Sunday phone - [phone]

## 2023-08-17 ENCOUNTER — Ambulatory Visit: Admitting: Obstetrics and Gynecology

## 2023-08-17 ENCOUNTER — Encounter: Payer: Self-pay | Admitting: Obstetrics and Gynecology

## 2023-08-17 VITALS — BP 115/76 | HR 99 | Wt 198.0 lb

## 2023-08-17 DIAGNOSIS — Z8719 Personal history of other diseases of the digestive system: Secondary | ICD-10-CM

## 2023-08-17 DIAGNOSIS — Z98891 History of uterine scar from previous surgery: Secondary | ICD-10-CM | POA: Diagnosis not present

## 2023-08-17 DIAGNOSIS — Z8759 Personal history of other complications of pregnancy, childbirth and the puerperium: Secondary | ICD-10-CM | POA: Diagnosis not present

## 2023-08-17 DIAGNOSIS — F419 Anxiety disorder, unspecified: Secondary | ICD-10-CM

## 2023-08-17 DIAGNOSIS — Z3480 Encounter for supervision of other normal pregnancy, unspecified trimester: Secondary | ICD-10-CM

## 2023-08-17 DIAGNOSIS — Z3A23 23 weeks gestation of pregnancy: Secondary | ICD-10-CM

## 2023-08-17 DIAGNOSIS — L299 Pruritus, unspecified: Secondary | ICD-10-CM

## 2023-08-17 DIAGNOSIS — Z8632 Personal history of gestational diabetes: Secondary | ICD-10-CM | POA: Diagnosis not present

## 2023-08-17 MED ORDER — HYDROXYZINE HCL 25 MG PO TABS: 25.0000 mg | ORAL_TABLET | Freq: Four times a day (QID) | ORAL | 2 refills | Status: DC | PRN

## 2023-08-17 NOTE — Progress Notes (Signed)
 PRENATAL VISIT NOTE  Subjective:  Stacey Cook is a 28 y.o. [phone] at [redacted]w[redacted]d being seen today for ongoing prenatal care.  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; Asthma, mild intermittent; Binge-eating disorder, in partial remission, moderate; Cholesteatoma of attic of right ear; Perforation of left tympanic membrane; Syncope and collapse; Conductive hearing loss, unilat, unrestrict hearing contralateral side; Depression with anxiety and suicidal ideations; Chest pain of uncertain etiology; History of gestational diabetes; History of cholestasis during pregnancy; History of shoulder dystocia in prior pregnancy; History of postpartum hemorrhage; Pancreatitis; 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  Patient reports  still itching hands and feet worse at night. Anxiety due to family situation that has occurred .  Contractions: Irritability. Vag. Bleeding: None.  Movement: Present. Denies leaking of fluid.   The following portions of the patient's history were reviewed and updated as appropriate: allergies, current medications, past family history, past medical history, past social history, past surgical history and problem list.   Objective:   Vitals:   08/17/23 0852  BP: 115/76  Pulse: 99  Weight: 198 lb (89.8 kg)    Fetal Status: Fetal Heart Rate (bpm): 145 Fundal Height: 23 cm Movement:  Present     General:  Alert, oriented and cooperative. Patient is in no acute distress.  Skin: Skin is warm and dry. No rash noted.   Cardiovascular: Normal heart rate noted  Respiratory: Normal respiratory effort, no problems with respiration noted  Abdomen: Soft, gravid, appropriate for gestational age.  Pain/Pressure: Present     Pelvic: Cervical exam deferred        Extremities: Normal range of motion.  Edema: Trace  Mental Status: Normal mood and affect. Normal behavior. Normal judgment and thought content.   Assessment and Plan:  Pregnancy: [id number] at [redacted]w[redacted]d 1. Supervision of other normal pregnancy, antepartum (Primary)   2. History of cholestasis during pregnancy Normal last visit  - Comp Met (CMET) - Bile acids, total  3. History of cesarean section 4. History of VBAC Desires tolac and BTL, consent next visit   5. History of gestational diabetes Normal A1c  Anticipatory guidance regarding GTT and labs next visit, discussed NPO status after midnight   6. Pruritus  - Comp Met (CMET) - Bile acids, total  7. Anxiety Discussed option for counseling services, declines today, still processing and has good support with grandmother Discussed options for management including daily medication vs PRN, desires PRN, mostly at night  - hydrOXYzine (ATARAX) 25 MG tablet; Take 1 tablet (25 mg total) by mouth every 6 (six) hours as needed for itching.  Dispense: 30 tablet; Refill: 2  8. [redacted] weeks gestation of pregnancy Discussed appetite and hydration, utilize diclegis as needed, small meals/snacks   Preterm labor symptoms and general obstetric precautions including but not limited to vaginal bleeding, contractions, leaking of fluid and fetal movement were reviewed in detail with the patient. Please refer to After Visit Summary for other counseling recommendations.   Return in 4 weeks for LOB and PN2 Future  Appointments  Date Time Provider Department Center  09/14/2023  8:30 AM  [facility]  09/14/2023  9:10 AM Arabella Merles, CNM [facility]     Albertine Grates, FNP

## 2023-08-18 ENCOUNTER — Encounter: Payer: Self-pay | Admitting: Women's Health

## 2023-08-19 LAB — COMPREHENSIVE METABOLIC PANEL WITH GFR
ALT: 16 IU/L (ref 0–32)
AST: 14 IU/L (ref 0–40)
Albumin: 4 g/dL (ref 4.0–5.0)
Alkaline Phosphatase: 131 IU/L — ABNORMAL HIGH (ref 44–121)
BUN/Creatinine Ratio: 12 (ref 9–23)
BUN: 6 mg/dL (ref 6–20)
Bilirubin Total: 0.3 mg/dL (ref 0.0–1.2)
CO2: 17 mmol/L — ABNORMAL LOW (ref 20–29)
Calcium: 9.5 mg/dL (ref 8.7–10.2)
Chloride: 102 mmol/L (ref 96–106)
Creatinine, Ser: 0.49 mg/dL — ABNORMAL LOW (ref 0.57–1.00)
Globulin, Total: 2.5 g/dL (ref 1.5–4.5)
Glucose: 86 mg/dL (ref 70–99)
Potassium: 4 mmol/L (ref 3.5–5.2)
Sodium: 136 mmol/L (ref 134–144)
Total Protein: 6.5 g/dL (ref 6.0–8.5)
eGFR: 134 mL/min/{1.73_m2} (ref >59)

## 2023-08-19 LAB — BILE ACIDS, TOTAL: Bile Acids Total: 4.4 umol/L (ref 0.0–10.0)

## 2023-09-14 ENCOUNTER — Ambulatory Visit: Admitting: Advanced Practice Midwife

## 2023-09-14 ENCOUNTER — Other Ambulatory Visit

## 2023-09-14 ENCOUNTER — Encounter: Payer: Self-pay | Admitting: Advanced Practice Midwife

## 2023-09-14 VITALS — BP 114/74 | HR 105 | Wt 196.0 lb

## 2023-09-14 DIAGNOSIS — Z3A27 27 weeks gestation of pregnancy: Secondary | ICD-10-CM | POA: Diagnosis not present

## 2023-09-14 DIAGNOSIS — Z131 Encounter for screening for diabetes mellitus: Secondary | ICD-10-CM

## 2023-09-14 DIAGNOSIS — O26842 Uterine size-date discrepancy, second trimester: Secondary | ICD-10-CM | POA: Diagnosis not present

## 2023-09-14 DIAGNOSIS — Z1332 Encounter for screening for maternal depression: Secondary | ICD-10-CM

## 2023-09-14 DIAGNOSIS — O99712 Diseases of the skin and subcutaneous tissue complicating pregnancy, second trimester: Secondary | ICD-10-CM | POA: Diagnosis not present

## 2023-09-14 DIAGNOSIS — L299 Pruritus, unspecified: Secondary | ICD-10-CM

## 2023-09-14 DIAGNOSIS — Z3480 Encounter for supervision of other normal pregnancy, unspecified trimester: Secondary | ICD-10-CM

## 2023-09-14 DIAGNOSIS — Z3A28 28 weeks gestation of pregnancy: Secondary | ICD-10-CM

## 2023-09-14 MED ORDER — DOXYLAMINE-PYRIDOXINE 10-10 MG PO TBEC: DELAYED_RELEASE_TABLET | ORAL | 6 refills | Status: DC

## 2023-09-14 NOTE — Progress Notes (Signed)
   LOW-RISK PREGNANCY VISIT Patient name: Stacey Cook MRN [id number]  Date of birth: 06-02-1995 Chief Complaint:   Routine Prenatal Visit (PN2/Rash to abdomen, left arm,leg)  History of Present Illness:   Stacey Cook is a 28 y.o. [id number] female at [redacted]w[redacted]d with an Estimated Date of Delivery: 12/08/23 being seen today for ongoing management of a low-risk pregnancy.  Today she reports  continued itching in center of palms, instep of feet, and L abd . Contractions: Not present. Vag. Bleeding: None.  Movement: Present. denies leaking of fluid. Review of Systems:   Pertinent items are noted in HPI Denies abnormal vaginal discharge w/ itching/odor/irritation, headaches, visual changes, shortness of breath, chest pain, abdominal pain, severe nausea/vomiting, or problems with urination or bowel movements unless otherwise stated above. Pertinent History Reviewed:  Reviewed past medical,surgical, social, obstetrical and family history.  Reviewed problem list, medications and allergies. Physical Assessment:   Vitals:   09/14/23 0902  BP: 114/74  Pulse: (!) 105  Weight: 196 lb (88.9 kg)  Body mass index is 39.59 kg/m.        Physical Examination:   General appearance: Well appearing, and in no distress  Mental status: Alert, oriented to person, place, and time  Skin: Warm & dry  Cardiovascular: Normal heart rate noted  Respiratory: Normal respiratory effort, no distress  Abdomen: Soft, gravid, nontender  Pelvic: Cervical exam deferred         Extremities: Edema: Trace  Fetal Status: Fetal Heart Rate (bpm): 151 Fundal Height: 31 cm Movement: Present    No results found for this or any previous visit (from the past 24 hours).  Assessment & Plan:  1) Low-risk pregnancy [id number] at [redacted]w[redacted]d with an Estimated Date of Delivery: 12/08/23   2) Continued pruritus, will get bile acids again today with hx ICP; reviewed again comfort measures  3) Hx GDM, PN2 toda  4) S>D, order u/s for 32wks  5)  Still planning for TOLAC, consent @ 36wks w MD  6) No longer wanting ppBTL, feels pressured to get it by family but she doesn't want one; she is considering an IUD @ PP visit (had postplacental and it fell out) or NuvaRing   Meds:  Meds ordered this encounter  Medications   Doxylamine -Pyridoxine  (DICLEGIS ) 10-10 MG TBEC    Sig: 2 tabs q hs, if sx persist add 1 tab q am on day 3, if sx persist add 1 tab q afternoon on day 4    Dispense:  100 tablet    Refill:  6    Supervising Provider:   Wendelyn Halter [[id number]]   Labs/procedures today: PN2; bile acids  Plan:  Continue routine obstetrical care   Reviewed: Preterm labor symptoms and general obstetric precautions including but not limited to vaginal bleeding, contractions, leaking of fluid and fetal movement were reviewed in detail with the patient.  All questions were answered. Has home bp cuff. Check bp weekly, let us  know if >140/90.   Follow-up: Return for 2wk LROB; 4wk LROB & growth u/s.  Orders Placed This Encounter  Procedures   US  OB Follow Up   Bile acids, total   Jolayne Natter [facility] 09/14/2023 9:48 AM

## 2023-09-14 NOTE — Patient Instructions (Signed)
Stacey Cook, thank you for choosing our office today! We appreciate the opportunity to meet your healthcare needs. You may receive a short survey by mail, e-mail, or through MyChart. If you are happy with your care we would appreciate if you could take just a few minutes to complete the survey questions. We read all of your comments and take your feedback very seriously. Thank you again for choosing our office.  [facility] ([address] [place], [place] [address]) Entrance C, located off of [address] Free 24/7 valet parking   CLASSES: Go to [email or web address] to register for classes (childbirth, breastfeeding, waterbirth, infant CPR, daddy bootcamp, etc.)  Call the office ([phone]) or go to [facility] if: You begin to have strong, frequent contractions Your water breaks.  Sometimes it is a big gush of fluid, sometimes it is just a trickle that keeps getting your panties wet or running down your legs You have vaginal bleeding.  It is normal to have a small amount of spotting if your cervix was checked.  You don't feel your baby moving like normal.  If you don't, get you something to eat and drink and lay down and focus on feeling your baby move.   If your baby is still not moving like normal, you should call the office or go to [facility].  Call the office ([phone]) or go to [facility] for these signs of pre-eclampsia: Severe headache that does not go away with Tylenol Visual changes- seeing spots, double, blurred vision Pain under your right breast or upper abdomen that does not go away with Tums or heartburn medicine Nausea and/or vomiting Severe swelling in your hands, feet, and face   Tdap Vaccine It is recommended that you get the Tdap vaccine during the third trimester of EACH pregnancy to help protect your baby from getting pertussis (whooping cough) 27-36 weeks is the BEST time to do  this so that you can pass the protection on to your baby. During pregnancy is better than after pregnancy, but if you are unable to get it during pregnancy it will be offered at the hospital.  You can get this vaccine with us, at the health department, your family doctor, or some local pharmacies Everyone who will be around your baby should also be up-to-date on their vaccines before the baby comes. Adults (who are not pregnant) only need 1 dose of Tdap during adulthood.   [place] Pediatric[facility]/Family Doctors [facility] ([facility]): [address], [phone]           [facility]: [address], [phone]                [facility] ([facility]): [address], [phone] (call to ask if accepting patients) [facility]: [address], [place], [phone]    [place] Pediatricians/Family Doctors [facility] ([facility]): [address], [phone] [facility]: [address], [phone] [facility]: [address], [phone]  [place] [facility]  [facility] ([facility]): [phone] [facility]: [address], [phone]   [place] [facility]: [address], [phone]  [place] [facility]  [facility]: [address], [phone]  Home Blood Pressure Monitoring for Patients   Your provider has recommended that you check your   blood pressure (BP) at least once a week at home. If you do not have a blood pressure cuff at home, one will be provided for you. Contact your provider if you have not received your monitor within 1 week.   Helpful Tips for Accurate Home Blood Pressure Checks  Don't smoke, exercise, or drink caffeine 30 minutes before checking your BP Use the restroom before checking your BP (a full bladder can raise your  pressure) Relax in a comfortable upright chair Feet on the ground Left arm resting comfortably on a flat surface at the level of your heart Legs uncrossed Back supported Sit quietly and don't talk Place the cuff on your bare arm Adjust snuggly, so that only two fingertips can fit between your skin and the top of the cuff Check 2 readings separated by at least one minute Keep a log of your BP readings For a visual, please reference this diagram: http://ccnc.care/bpdiagram  Provider Name: [facility]     Phone: [phone]  Zone 1: ALL CLEAR  Continue to monitor your symptoms:  BP reading is less than 140 (top number) or less than 90 (bottom number)  No right upper stomach pain No headaches or seeing spots No feeling nauseated or throwing up No swelling in face and hands  Zone 2: CAUTION Call your doctor's office for any of the following:  BP reading is greater than 140 (top number) or greater than 90 (bottom number)  Stomach pain under your ribs in the middle or right side Headaches or seeing spots Feeling nauseated or throwing up Swelling in face and hands  Zone 3: EMERGENCY  Seek immediate medical care if you have any of the following:  BP reading is greater than160 (top number) or greater than 110 (bottom number) Severe headaches not improving with Tylenol Serious difficulty catching your breath Any worsening symptoms from Zone 2   Third Trimester of Pregnancy The third trimester is from week 29 through week 42, months 7 through 9. The third trimester is a time when the fetus is growing rapidly. At the end of the ninth month, the fetus is about 20 inches in length and weighs 6-10 pounds.  BODY CHANGES Your body goes through many changes during pregnancy. The changes vary from woman to woman.  Your weight will continue to increase. You can expect to gain 25-35 pounds (11-16 kg) by the end of the pregnancy. You may begin to get stretch marks on your hips, abdomen,  and breasts. You may urinate more often because the fetus is moving lower into your pelvis and pressing on your bladder. You may develop or continue to have heartburn as a result of your pregnancy. You may develop constipation because certain hormones are causing the muscles that push waste through your intestines to slow down. You may develop hemorrhoids or swollen, bulging veins (varicose veins). You may have pelvic pain because of the weight gain and pregnancy hormones relaxing your joints between the bones in your pelvis. Backaches may result from overexertion of the muscles supporting your posture. You may have changes in your hair. These can include thickening of your hair, rapid growth, and changes in texture. Some women also have hair loss during or after pregnancy, or hair that feels dry or thin. Your hair will most likely return to normal after your baby is born. Your breasts will continue to grow and be tender. A yellow discharge may leak from your breasts called colostrum. Your belly button may stick out. You may   feel short of breath because of your expanding uterus. You may notice the fetus "dropping," or moving lower in your abdomen. You may have a bloody mucus discharge. This usually occurs a few days to a week before labor begins. Your cervix becomes thin and soft (effaced) near your due date. WHAT TO EXPECT AT YOUR PRENATAL EXAMS  You will have prenatal exams every 2 weeks until week 36. Then, you will have weekly prenatal exams. During a routine prenatal visit: You will be weighed to make sure you and the fetus are growing normally. Your blood pressure is taken. Your abdomen will be measured to track your baby's growth. The fetal heartbeat will be listened to. Any test results from the previous visit will be discussed. You may have a cervical check near your due date to see if you have effaced. At around 36 weeks, your caregiver will check your cervix. At the same time, your  caregiver will also perform a test on the secretions of the vaginal tissue. This test is to determine if a type of bacteria, Group B streptococcus, is present. Your caregiver will explain this further. Your caregiver may ask you: What your birth plan is. How you are feeling. If you are feeling the baby move. If you have had any abnormal symptoms, such as leaking fluid, bleeding, severe headaches, or abdominal cramping. If you have any questions. Other tests or screenings that may be performed during your third trimester include: Blood tests that check for low iron levels (anemia). Fetal testing to check the health, activity level, and growth of the fetus. Testing is done if you have certain medical conditions or if there are problems during the pregnancy. FALSE LABOR You may feel small, irregular contractions that eventually go away. These are called Braxton Hicks contractions, or false labor. Contractions may last for hours, days, or even weeks before true labor sets in. If contractions come at regular intervals, intensify, or become painful, it is best to be seen by your caregiver.  SIGNS OF LABOR  Menstrual-like cramps. Contractions that are 5 minutes apart or less. Contractions that start on the top of the uterus and spread down to the lower abdomen and back. A sense of increased pelvic pressure or back pain. A watery or bloody mucus discharge that comes from the vagina. If you have any of these signs before the 37th week of pregnancy, call your caregiver right away. You need to go to the hospital to get checked immediately. HOME CARE INSTRUCTIONS  Avoid all smoking, herbs, alcohol, and unprescribed drugs. These chemicals affect the formation and growth of the baby. Follow your caregiver's instructions regarding medicine use. There are medicines that are either safe or unsafe to take during pregnancy. Exercise only as directed by your caregiver. Experiencing uterine cramps is a good sign to  stop exercising. Continue to eat regular, healthy meals. Wear a good support bra for breast tenderness. Do not use hot tubs, steam rooms, or saunas. Wear your seat belt at all times when driving. Avoid raw meat, uncooked cheese, cat litter boxes, and soil used by cats. These carry germs that can cause birth defects in the baby. Take your prenatal vitamins. Try taking a stool softener (if your caregiver approves) if you develop constipation. Eat more high-fiber foods, such as fresh vegetables or fruit and whole grains. Drink plenty of fluids to keep your urine clear or pale yellow. Take warm sitz baths to soothe any pain or discomfort caused by hemorrhoids. Use hemorrhoid cream if   your caregiver approves. If you develop varicose veins, wear support hose. Elevate your feet for 15 minutes, 3-4 times a day. Limit salt in your diet. Avoid heavy lifting, wear low heal shoes, and practice good posture. Rest a lot with your legs elevated if you have leg cramps or low back pain. Visit your dentist if you have not gone during your pregnancy. Use a soft toothbrush to brush your teeth and be gentle when you floss. A sexual relationship may be continued unless your caregiver directs you otherwise. Do not travel far distances unless it is absolutely necessary and only with the approval of your caregiver. Take prenatal classes to understand, practice, and ask questions about the labor and delivery. Make a trial run to the hospital. Pack your hospital bag. Prepare the baby's nursery. Continue to go to all your prenatal visits as directed by your caregiver. SEEK MEDICAL CARE IF: You are unsure if you are in labor or if your water has broken. You have dizziness. You have mild pelvic cramps, pelvic pressure, or nagging pain in your abdominal area. You have persistent nausea, vomiting, or diarrhea. You have a bad smelling vaginal discharge. You have pain with urination. SEEK IMMEDIATE MEDICAL CARE IF:  You  have a fever. You are leaking fluid from your vagina. You have spotting or bleeding from your vagina. You have severe abdominal cramping or pain. You have rapid weight loss or gain. You have shortness of breath with chest pain. You notice sudden or extreme swelling of your face, hands, ankles, feet, or legs. You have not felt your baby move in over an hour. You have severe headaches that do not go away with medicine. You have vision changes. Document Released: 04/20/2001 Document Revised: 05/01/2013 Document Reviewed: 06/27/2012 [facility] Patient Information 2015 [place], [place]. This information is not intended to replace advice given to you by your health care provider. Make sure you discuss any questions you have with your health care provider.

## 2023-09-16 LAB — CBC
Hematocrit: 35.5 % (ref 34.0–46.6)
Hemoglobin: 11.8 g/dL (ref 11.1–15.9)
MCH: 30.4 pg (ref 26.6–33.0)
MCHC: 33.2 g/dL (ref 31.5–35.7)
MCV: 92 fL (ref 79–97)
Platelets: 166 10*3/uL (ref 150–450)
RBC: 3.88 x10E6/uL (ref 3.77–5.28)
RDW: 13.5 % (ref 11.7–15.4)
WBC: 9 10*3/uL (ref 3.4–10.8)

## 2023-09-16 LAB — GLUCOSE TOLERANCE, 2 HOURS W/ 1HR
Glucose, 1 hour: 154 mg/dL (ref 70–179)
Glucose, 2 hour: 97 mg/dL (ref 70–152)
Glucose, Fasting: 80 mg/dL (ref 70–91)

## 2023-09-16 LAB — BILE ACIDS, TOTAL: Bile Acids Total: 3.6 umol/L (ref 0.0–10.0)

## 2023-09-16 LAB — ANTIBODY SCREEN: Antibody Screen: NEGATIVE

## 2023-09-16 LAB — HIV ANTIBODY (ROUTINE TESTING W REFLEX): HIV Screen 4th Generation wRfx: NONREACTIVE

## 2023-09-16 LAB — RPR: RPR Ser Ql: NONREACTIVE

## 2023-09-18 ENCOUNTER — Encounter: Payer: Self-pay | Admitting: Advanced Practice Midwife

## 2023-09-26 ENCOUNTER — Encounter: Payer: Self-pay | Admitting: Women's Health

## 2023-09-28 ENCOUNTER — Ambulatory Visit: Admitting: Advanced Practice Midwife

## 2023-09-28 VITALS — BP 114/73 | HR 97 | Wt 199.0 lb

## 2023-09-28 DIAGNOSIS — Z3A29 29 weeks gestation of pregnancy: Secondary | ICD-10-CM

## 2023-09-28 DIAGNOSIS — Z3483 Encounter for supervision of other normal pregnancy, third trimester: Secondary | ICD-10-CM | POA: Diagnosis not present

## 2023-09-28 NOTE — Progress Notes (Signed)
   LOW-RISK PREGNANCY VISIT Patient name: ELZADA PYTEL MRN [id number]  Date of birth: [birth year] Chief Complaint:   Routine Prenatal Visit  History of Present Illness:   MERIAH SHANDS is a 28 y.o. [id number] female at [redacted]w[redacted]d with an Estimated Date of Delivery: 12/08/23 being seen today for ongoing management of a low-risk pregnancy.  Today she reports rash is improving, as are headaches; still w N/V; lots of back pain/pelvic pressure- wants cx check. Contractions: Irritability. Vag. Bleeding: None.  Movement: Present. denies leaking of fluid. Review of Systems:   Pertinent items are noted in HPI Denies abnormal vaginal discharge w/ itching/odor/irritation, headaches, visual changes, shortness of breath, chest pain, abdominal pain, severe nausea/vomiting, or problems with urination or bowel movements unless otherwise stated above. Pertinent History Reviewed:  Reviewed past medical,surgical, social, obstetrical and family history.  Reviewed problem list, medications and allergies. Physical Assessment:   Vitals:   09/28/23 1043  BP: 114/73  Pulse: 97  Weight: 199 lb (90.3 kg)  Body mass index is 40.19 kg/m.        Physical Examination:   General appearance: Well appearing, and in no distress  Mental status: Alert, oriented to person, place, and time  Skin: Warm & dry  Cardiovascular: Normal heart rate noted  Respiratory: Normal respiratory effort, no distress  Abdomen: Soft, gravid, nontender  Pelvic: Cervical exam deferred  Dilation: Closed Effacement (%): 0 Station: Ballotable  Extremities:    Fetal Status: Fetal Heart Rate (bpm): 146 Fundal Height: 31 cm Movement: Present    No results found for this or any previous visit (from the past 24 hours).  Assessment & Plan:  1) Low-risk pregnancy [id number] at [redacted]w[redacted]d with an Estimated Date of Delivery: 12/08/23   2) Hx GDM, nl GTT  3) S>D, has u/s for 32wks, and will plan for 36wks also due to hx of SD  4) Wants TOLAC, as long as size of  baby seems reasonable; will sign consent w MD ~36wks   Meds: No orders of the defined types were placed in this encounter.  Labs/procedures today: SVE due to discomfort  Plan:  Continue routine obstetrical care   Reviewed: Preterm labor symptoms and general obstetric precautions including but not limited to vaginal bleeding, contractions, leaking of fluid and fetal movement were reviewed in detail with the patient.  All questions were answered. Has home bp cuff. Check bp weekly, let us  know if >140/90.   Follow-up: Return for As scheduled. ([facility] w visit)  No orders of the defined types were placed in this encounter.  Jolayne Natter CNM 09/28/2023 11:11 AM

## 2023-09-28 NOTE — Patient Instructions (Signed)
Stacey Cook, thank you for choosing our office today! We appreciate the opportunity to meet your healthcare needs. You may receive a short survey by mail, e-mail, or through MyChart. If you are happy with your care we would appreciate if you could take just a few minutes to complete the survey questions. We read all of your comments and take your feedback very seriously. Thank you again for choosing our office.  [facility] ([address] [place], [place] [address]) Entrance C, located off of [address] Free 24/7 valet parking   CLASSES: Go to [email or web address] to register for classes (childbirth, breastfeeding, waterbirth, infant CPR, daddy bootcamp, etc.)  Call the office ([phone]) or go to [facility] if: You begin to have strong, frequent contractions Your water breaks.  Sometimes it is a big gush of fluid, sometimes it is just a trickle that keeps getting your panties wet or running down your legs You have vaginal bleeding.  It is normal to have a small amount of spotting if your cervix was checked.  You don't feel your baby moving like normal.  If you don't, get you something to eat and drink and lay down and focus on feeling your baby move.   If your baby is still not moving like normal, you should call the office or go to [facility].  Call the office ([phone]) or go to [facility] for these signs of pre-eclampsia: Severe headache that does not go away with Tylenol Visual changes- seeing spots, double, blurred vision Pain under your right breast or upper abdomen that does not go away with Tums or heartburn medicine Nausea and/or vomiting Severe swelling in your hands, feet, and face   Tdap Vaccine It is recommended that you get the Tdap vaccine during the third trimester of EACH pregnancy to help protect your baby from getting pertussis (whooping cough) 27-36 weeks is the BEST time to do  this so that you can pass the protection on to your baby. During pregnancy is better than after pregnancy, but if you are unable to get it during pregnancy it will be offered at the hospital.  You can get this vaccine with us, at the health department, your family doctor, or some local pharmacies Everyone who will be around your baby should also be up-to-date on their vaccines before the baby comes. Adults (who are not pregnant) only need 1 dose of Tdap during adulthood.   [place] Pediatric[facility]/Family Doctors [facility] ([facility]): [address], [phone]           [facility]: [address], [phone]                [facility] ([facility]): [address], [phone] (call to ask if accepting patients) [facility]: [address], [place], [phone]    [place] Pediatricians/Family Doctors [facility] ([facility]): [address], [phone] [facility]: [address], [phone] [facility]: [address], [phone]  [place] [facility]  [facility] ([facility]): [phone] [facility]: [address], [phone]   [place] [facility]: [address], [phone]  [place] [facility]  [facility]: [address], [phone]  Home Blood Pressure Monitoring for Patients   Your provider has recommended that you check your   blood pressure (BP) at least once a week at home. If you do not have a blood pressure cuff at home, one will be provided for you. Contact your provider if you have not received your monitor within 1 week.   Helpful Tips for Accurate Home Blood Pressure Checks  Don't smoke, exercise, or drink caffeine 30 minutes before checking your BP Use the restroom before checking your BP (a full bladder can raise your  pressure) Relax in a comfortable upright chair Feet on the ground Left arm resting comfortably on a flat surface at the level of your heart Legs uncrossed Back supported Sit quietly and don't talk Place the cuff on your bare arm Adjust snuggly, so that only two fingertips can fit between your skin and the top of the cuff Check 2 readings separated by at least one minute Keep a log of your BP readings For a visual, please reference this diagram: http://ccnc.care/bpdiagram  Provider Name: [facility]     Phone: [phone]  Zone 1: ALL CLEAR  Continue to monitor your symptoms:  BP reading is less than 140 (top number) or less than 90 (bottom number)  No right upper stomach pain No headaches or seeing spots No feeling nauseated or throwing up No swelling in face and hands  Zone 2: CAUTION Call your doctor's office for any of the following:  BP reading is greater than 140 (top number) or greater than 90 (bottom number)  Stomach pain under your ribs in the middle or right side Headaches or seeing spots Feeling nauseated or throwing up Swelling in face and hands  Zone 3: EMERGENCY  Seek immediate medical care if you have any of the following:  BP reading is greater than160 (top number) or greater than 110 (bottom number) Severe headaches not improving with Tylenol Serious difficulty catching your breath Any worsening symptoms from Zone 2   Third Trimester of Pregnancy The third trimester is from week 29 through week 42, months 7 through 9. The third trimester is a time when the fetus is growing rapidly. At the end of the ninth month, the fetus is about 20 inches in length and weighs 6-10 pounds.  BODY CHANGES Your body goes through many changes during pregnancy. The changes vary from woman to woman.  Your weight will continue to increase. You can expect to gain 25-35 pounds (11-16 kg) by the end of the pregnancy. You may begin to get stretch marks on your hips, abdomen,  and breasts. You may urinate more often because the fetus is moving lower into your pelvis and pressing on your bladder. You may develop or continue to have heartburn as a result of your pregnancy. You may develop constipation because certain hormones are causing the muscles that push waste through your intestines to slow down. You may develop hemorrhoids or swollen, bulging veins (varicose veins). You may have pelvic pain because of the weight gain and pregnancy hormones relaxing your joints between the bones in your pelvis. Backaches may result from overexertion of the muscles supporting your posture. You may have changes in your hair. These can include thickening of your hair, rapid growth, and changes in texture. Some women also have hair loss during or after pregnancy, or hair that feels dry or thin. Your hair will most likely return to normal after your baby is born. Your breasts will continue to grow and be tender. A yellow discharge may leak from your breasts called colostrum. Your belly button may stick out. You may   feel short of breath because of your expanding uterus. You may notice the fetus "dropping," or moving lower in your abdomen. You may have a bloody mucus discharge. This usually occurs a few days to a week before labor begins. Your cervix becomes thin and soft (effaced) near your due date. WHAT TO EXPECT AT YOUR PRENATAL EXAMS  You will have prenatal exams every 2 weeks until week 36. Then, you will have weekly prenatal exams. During a routine prenatal visit: You will be weighed to make sure you and the fetus are growing normally. Your blood pressure is taken. Your abdomen will be measured to track your baby's growth. The fetal heartbeat will be listened to. Any test results from the previous visit will be discussed. You may have a cervical check near your due date to see if you have effaced. At around 36 weeks, your caregiver will check your cervix. At the same time, your  caregiver will also perform a test on the secretions of the vaginal tissue. This test is to determine if a type of bacteria, Group B streptococcus, is present. Your caregiver will explain this further. Your caregiver may ask you: What your birth plan is. How you are feeling. If you are feeling the baby move. If you have had any abnormal symptoms, such as leaking fluid, bleeding, severe headaches, or abdominal cramping. If you have any questions. Other tests or screenings that may be performed during your third trimester include: Blood tests that check for low iron levels (anemia). Fetal testing to check the health, activity level, and growth of the fetus. Testing is done if you have certain medical conditions or if there are problems during the pregnancy. FALSE LABOR You may feel small, irregular contractions that eventually go away. These are called Braxton Hicks contractions, or false labor. Contractions may last for hours, days, or even weeks before true labor sets in. If contractions come at regular intervals, intensify, or become painful, it is best to be seen by your caregiver.  SIGNS OF LABOR  Menstrual-like cramps. Contractions that are 5 minutes apart or less. Contractions that start on the top of the uterus and spread down to the lower abdomen and back. A sense of increased pelvic pressure or back pain. A watery or bloody mucus discharge that comes from the vagina. If you have any of these signs before the 37th week of pregnancy, call your caregiver right away. You need to go to the hospital to get checked immediately. HOME CARE INSTRUCTIONS  Avoid all smoking, herbs, alcohol, and unprescribed drugs. These chemicals affect the formation and growth of the baby. Follow your caregiver's instructions regarding medicine use. There are medicines that are either safe or unsafe to take during pregnancy. Exercise only as directed by your caregiver. Experiencing uterine cramps is a good sign to  stop exercising. Continue to eat regular, healthy meals. Wear a good support bra for breast tenderness. Do not use hot tubs, steam rooms, or saunas. Wear your seat belt at all times when driving. Avoid raw meat, uncooked cheese, cat litter boxes, and soil used by cats. These carry germs that can cause birth defects in the baby. Take your prenatal vitamins. Try taking a stool softener (if your caregiver approves) if you develop constipation. Eat more high-fiber foods, such as fresh vegetables or fruit and whole grains. Drink plenty of fluids to keep your urine clear or pale yellow. Take warm sitz baths to soothe any pain or discomfort caused by hemorrhoids. Use hemorrhoid cream if   your caregiver approves. If you develop varicose veins, wear support hose. Elevate your feet for 15 minutes, 3-4 times a day. Limit salt in your diet. Avoid heavy lifting, wear low heal shoes, and practice good posture. Rest a lot with your legs elevated if you have leg cramps or low back pain. Visit your dentist if you have not gone during your pregnancy. Use a soft toothbrush to brush your teeth and be gentle when you floss. A sexual relationship may be continued unless your caregiver directs you otherwise. Do not travel far distances unless it is absolutely necessary and only with the approval of your caregiver. Take prenatal classes to understand, practice, and ask questions about the labor and delivery. Make a trial run to the hospital. Pack your hospital bag. Prepare the baby's nursery. Continue to go to all your prenatal visits as directed by your caregiver. SEEK MEDICAL CARE IF: You are unsure if you are in labor or if your water has broken. You have dizziness. You have mild pelvic cramps, pelvic pressure, or nagging pain in your abdominal area. You have persistent nausea, vomiting, or diarrhea. You have a bad smelling vaginal discharge. You have pain with urination. SEEK IMMEDIATE MEDICAL CARE IF:  You  have a fever. You are leaking fluid from your vagina. You have spotting or bleeding from your vagina. You have severe abdominal cramping or pain. You have rapid weight loss or gain. You have shortness of breath with chest pain. You notice sudden or extreme swelling of your face, hands, ankles, feet, or legs. You have not felt your baby move in over an hour. You have severe headaches that do not go away with medicine. You have vision changes. Document Released: 04/20/2001 Document Revised: 05/01/2013 Document Reviewed: 06/27/2012 [facility] Patient Information 2015 [place], [place]. This information is not intended to replace advice given to you by your health care provider. Make sure you discuss any questions you have with your health care provider.

## 2023-10-10 ENCOUNTER — Other Ambulatory Visit: Payer: Self-pay | Admitting: Advanced Practice Midwife

## 2023-10-10 ENCOUNTER — Encounter: Payer: Self-pay | Admitting: Advanced Practice Midwife

## 2023-10-10 DIAGNOSIS — O26842 Uterine size-date discrepancy, second trimester: Secondary | ICD-10-CM

## 2023-10-10 DIAGNOSIS — Z8719 Personal history of other diseases of the digestive system: Secondary | ICD-10-CM

## 2023-10-10 DIAGNOSIS — O34219 Maternal care for unspecified type scar from previous cesarean delivery: Secondary | ICD-10-CM

## 2023-10-10 DIAGNOSIS — L299 Pruritus, unspecified: Secondary | ICD-10-CM

## 2023-10-10 DIAGNOSIS — Z6839 Body mass index (BMI) 39.0-39.9, adult: Secondary | ICD-10-CM

## 2023-10-10 DIAGNOSIS — Z3480 Encounter for supervision of other normal pregnancy, unspecified trimester: Secondary | ICD-10-CM

## 2023-10-11 ENCOUNTER — Other Ambulatory Visit: Payer: Self-pay

## 2023-10-11 ENCOUNTER — Inpatient Hospital Stay (HOSPITAL_COMMUNITY)
Admission: AD | Admit: 2023-10-11 | Discharge: 2023-10-11 | Disposition: A | Discharge status: Home / Self Care | Attending: Obstetrics & Gynecology | Admitting: Obstetrics & Gynecology

## 2023-10-11 ENCOUNTER — Encounter (HOSPITAL_COMMUNITY): Payer: Self-pay | Admitting: Obstetrics & Gynecology

## 2023-10-11 DIAGNOSIS — M549 Dorsalgia, unspecified: Secondary | ICD-10-CM

## 2023-10-11 DIAGNOSIS — R102 Pelvic and perineal pain: Secondary | ICD-10-CM | POA: Insufficient documentation

## 2023-10-11 DIAGNOSIS — Z3A31 31 weeks gestation of pregnancy: Secondary | ICD-10-CM | POA: Diagnosis not present

## 2023-10-11 DIAGNOSIS — M62838 Other muscle spasm: Secondary | ICD-10-CM | POA: Diagnosis not present

## 2023-10-11 DIAGNOSIS — M545 Low back pain, unspecified: Secondary | ICD-10-CM | POA: Insufficient documentation

## 2023-10-11 DIAGNOSIS — O26893 Other specified pregnancy related conditions, third trimester: Secondary | ICD-10-CM | POA: Diagnosis present

## 2023-10-11 DIAGNOSIS — Z3689 Encounter for other specified antenatal screening: Secondary | ICD-10-CM | POA: Diagnosis not present

## 2023-10-11 DIAGNOSIS — O99891 Other specified diseases and conditions complicating pregnancy: Secondary | ICD-10-CM

## 2023-10-11 LAB — URINALYSIS, ROUTINE W REFLEX MICROSCOPIC
Bilirubin Urine: NEGATIVE
Glucose, UA: NEGATIVE mg/dL
Hgb urine dipstick: NEGATIVE
Ketones, ur: NEGATIVE mg/dL
Nitrite: NEGATIVE
Protein, ur: NEGATIVE mg/dL
Specific Gravity, Urine: 1.023 (ref 1.005–1.030)
pH: 6 (ref 5.0–8.0)

## 2023-10-11 MED ORDER — ACETAMINOPHEN 500 MG PO TABS: 1000.0000 mg | ORAL_TABLET | Freq: Four times a day (QID) | ORAL | Status: DC | PRN

## 2023-10-11 MED ORDER — CYCLOBENZAPRINE HCL 10 MG PO TABS: 10.0000 mg | ORAL_TABLET | Freq: Three times a day (TID) | ORAL | 0 refills | Status: DC | PRN

## 2023-10-11 MED ORDER — ACETAMINOPHEN 500 MG PO TABS
1000.0000 mg | ORAL_TABLET | Freq: Once | ORAL | Status: AC
  Administered 2023-10-11: 1000 mg via ORAL
  Filled 2023-10-11: qty 2

## 2023-10-11 MED ORDER — CYCLOBENZAPRINE HCL 5 MG PO TABS
10.0000 mg | ORAL_TABLET | Freq: Once | ORAL | Status: AC
  Administered 2023-10-11: 10 mg via ORAL
  Filled 2023-10-11: qty 2

## 2023-10-11 NOTE — Discharge Instructions (Signed)
 You were seen for muscle spasms in your back. You were given Tylenol  and Flexeril   I recommend you take Tylenol  1000mg  every 6 hours for 24 hours and then start to take as needed  You can take the Flexeril  every 8 hours if needed   Avoid lifting, bending and twisting motion  Typically the most severe pain resolves within the first 72 hours.   If you have persistent pain please reach out to your OB GYN.

## 2023-10-11 NOTE — MAU Provider Note (Signed)
 History     CSN: [id number]  Arrival date and time: 10/11/23 2059   Event Date/Time   First Provider Initiated Contact with Patient 10/11/23 2116      Chief Complaint  Patient presents with   Back Pain   Abdominal Pain   Pelvic Pain   Back Pain Associated symptoms include abdominal pain and pelvic pain. Pertinent negatives include no chest pain, dysuria or fever.  Abdominal Pain Pertinent negatives include no diarrhea, dysuria, fever, nausea or vomiting.  Pelvic Pain The patient's primary symptoms include pelvic pain. Associated symptoms include abdominal pain and back pain. Pertinent negatives include no chills, diarrhea, dysuria, fever, flank pain, nausea, rash, sore throat or vomiting.   Patient is 28 y.o. [id number] [redacted]w[redacted]d here with complaints of back pain.  - was running after her 28 yo  - noted acute pulling and pain in her left lower back - Radiated down left leg - no numbness, tingling - no loss of bowel/bladder - Worse with changes in position - radiation down leg has resolved. - positive pelvic pressure  +FM, denies LOF, VB, contractions, vaginal discharge.   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 Larrie Po, pt will most likely go home and he will tell her to arrive at 10:45 - already has labs   CHOLESTEATOMA EXCISION     COCHLEAR IMPLANT Right    COCHLEAR IMPLANT REMOVAL Right 05/2020   DILATION AND EVACUATION N/A 09/11/2016   Procedure: DILATATION AND CURRATAGE  2ND TRIMESTER;  Surgeon: Albino Hum, MD;  Location: [facility];  Service: Gynecology;  Laterality: N/A;   IMPLANTATION BONE ANCHORED HEARING AID Right 04/2013   INNER EAR SURGERY Right 03/31/2018   MIDDLE EAR SURGERY     28 surgeries for cholesteatoma   TYMPANOPLASTY Left    TYMPANOSTOMY      Family History  Problem Relation Age  of Onset   Hypertension Maternal Grandmother    Diabetes Maternal Grandmother    Stroke Maternal Grandmother    Seizures Maternal Grandmother    Asthma Maternal Grandmother    Hyperlipidemia Maternal Grandmother    Thyroid disease Maternal Grandmother    Cancer Maternal Grandmother    Cholelithiasis Mother    Kidney disease Mother        stones   Depression Mother    Asthma Brother    Scoliosis Daughter    Autism Son    Seizures Maternal Uncle    Cancer Other        breast- great aunt   Celiac disease Neg Hx     Social History   Tobacco Use   Smoking status: Every Day    Current packs/day: 0.00    Average packs/day: 0.5 packs/day for 1 year (0.5 ttl pk-yrs)    Types: Cigarettes    Start date: 05/2020    Last attempt to quit: 05/2021    Years since quitting: 2.4   Smokeless tobacco: Never   Tobacco comments:    "3-4 cigs a day "  Vaping Use   Vaping status: Former   Substances: Flavoring   Substance Use Topics   Alcohol use: Not Currently    Comment: occ   Drug use: No    Allergies:  Allergies  Allergen Reactions   Cat Dander Other (See Comments)    Puffy, watery eyes; sneezing   Dilaudid  [Hydromorphone ] Other (See Comments)    Vomiting, rib pain, dizzy, flushed, sweating   Keflex  [Cephalexin ] Other (See Comments)    Reaction:  Elevated liver enzymes    Other Other (See Comments)    Pt is allergic to surgical glue swelling;  Cats-congestion, sneezing, eyes watery, itching all over Reaction:  Infection    Adhesive [Tape] Rash    Medications Prior to Admission  Medication Sig Dispense Refill Last Dose/Taking   Doxylamine -Pyridoxine  (DICLEGIS ) 10-10 MG TBEC 2 tabs q hs, if sx persist add 1 tab q am on day 3, if sx persist add 1 tab q afternoon on day 4 100 tablet 6 Past Week   hydrOXYzine  (ATARAX ) 25 MG tablet Take 1 tablet (25 mg total) by mouth every 6 (six) hours as needed for itching. 30 tablet 2 Past Week   prenatal vitamin w/FE, FA (PRENATAL 1 + 1) 27-1 MG TABS tablet Take 1 tablet by mouth daily at 12 noon. 30 tablet 12 10/11/2023   albuterol  (PROVENTIL ) (2.5 MG/3ML) 0.083% nebulizer solution Take 2.5 mg by nebulization See admin instructions. 1 vial via nebulizer every 6 to 8 hours as needed for shortness of breath      albuterol  (VENTOLIN  HFA) 108 (90 Base) MCG/ACT inhaler Inhale 1-2 puffs into the lungs every 6 (six) hours as needed for wheezing or shortness of breath. 18 g 0 More than a month   Blood Pressure Monitor MISC For regular home bp monitoring during pregnancy 1 each 0    hydrocortisone  cream 1 % Apply 1 Application topically 2 (two) times daily.       Review of Systems  Constitutional:  Negative for chills and fever.  HENT:  Negative for congestion and sore throat.   Eyes:  Negative for pain and visual disturbance.  Respiratory:  Negative for cough, chest tightness and shortness of breath.   Cardiovascular:  Negative for chest pain.   Gastrointestinal:  Positive for abdominal pain. Negative for diarrhea, nausea and vomiting.  Endocrine: Negative for cold intolerance and heat  intolerance.  Genitourinary:  Positive for pelvic pain. Negative for dysuria and flank pain.  Musculoskeletal:  Positive for back pain.  Skin:  Negative for rash.  Allergic/Immunologic: Negative for food allergies.  Neurological:  Negative for dizziness and light-headedness.  Psychiatric/Behavioral:  Negative for agitation.    Physical Exam   Blood pressure 121/67, pulse (!) 111, temperature 98.4 F (36.9 C), temperature source Oral, resp. rate 18, height 4\' 11"  (1.499 m), weight 89.8 kg, last menstrual period 02/14/2023, SpO2 98%.  Physical Exam Vitals and nursing note reviewed.  Constitutional:      General: She is not in acute distress.    Appearance: She is well-developed.     Comments: Pregnant female  HENT:     Head: Normocephalic and atraumatic.  Eyes:     General: No scleral icterus.    Conjunctiva/sclera: Conjunctivae normal.  Cardiovascular:     Rate and Rhythm: Normal rate.  Pulmonary:     Effort: Pulmonary effort is normal.  Chest:     Chest wall: No tenderness.  Abdominal:     Palpations: Abdomen is soft.     Tenderness: There is no abdominal tenderness. There is no guarding or rebound.     Comments: Gravid  Genitourinary:    Vagina: Normal.  Musculoskeletal:        General: Normal range of motion.     Cervical back: Normal, normal range of motion and neck supple.     Thoracic back: Normal.     Lumbar back: Spasms and tenderness present.       Back:     Comments: + TTP over the left lower latitimus dorsi insertion on the ala. + spasm of parapsinal on the left.   No bony tenderness No step offs   Skin:    General: Skin is warm and dry.     Findings: No rash.  Neurological:     Mental Status: She is alert and oriented to person, place, and time.    I reviewed the patient's fetal monitoring.  Baseline HR:  140 Variability:  moderate Accels:present Decels: none  A/P: Reactive NST  Reassured regarding fetal status.   MAU Course  Procedures  MDM- moderate - MSK pain related to sudden motion with lat and paraspinal spasms  Meds ordered this encounter  Medications   acetaminophen  (TYLENOL ) tablet 1,000 mg   cyclobenzaprine  (FLEXERIL ) tablet 10 mg    Assessment and Plan   1. Back pain affecting pregnancy   2. Muscle spasm   3. [redacted] weeks gestation of pregnancy   4. NST (non-stress test) reactive    - Discharged stable condition - Rx for flexeril  sent - Scheduled tylenol  for 24 hours and the PRN - Reviewed warning signs and when to seek care  Future Appointments  Date Time Provider Department Center  10/12/2023 10:00 AM [facility] 2 [facility] None  10/12/2023 10:50 AM Ferd Householder, CNM [facility]    Marine Sia [facility] 10/11/2023, 9:16 PM

## 2023-10-11 NOTE — MAU Note (Signed)
 Stacey Cook is a 28 y.o. at [redacted]w[redacted]d here in MAU reporting: arrival via EMS due to back and abdominal pain and pressure. States she was running after her daughter who ran into the road and then the pain started. Does not think it is contractions but states "I don't know what this is but it hurts." Denies VB or LOF. States hasn't really felt baby move since it happened.    Onset of complaint: 2000 Pain score: 8/10 Vitals:   10/11/23 2107  BP: 121/67  Pulse: (!) 111  Resp: 18  Temp: 98.4 F (36.9 C)  SpO2: 98%     FHT: 145  Lab orders placed from triage: UA

## 2023-10-12 ENCOUNTER — Ambulatory Visit: Admitting: Women's Health

## 2023-10-12 ENCOUNTER — Encounter: Payer: Self-pay | Admitting: Women's Health

## 2023-10-12 ENCOUNTER — Ambulatory Visit: Admitting: Radiology

## 2023-10-12 ENCOUNTER — Other Ambulatory Visit: Payer: Self-pay | Admitting: Advanced Practice Midwife

## 2023-10-12 VITALS — BP 104/65 | HR 93 | Wt 199.0 lb

## 2023-10-12 DIAGNOSIS — O99713 Diseases of the skin and subcutaneous tissue complicating pregnancy, third trimester: Secondary | ICD-10-CM

## 2023-10-12 DIAGNOSIS — Z3A31 31 weeks gestation of pregnancy: Secondary | ICD-10-CM

## 2023-10-12 DIAGNOSIS — O34219 Maternal care for unspecified type scar from previous cesarean delivery: Secondary | ICD-10-CM

## 2023-10-12 DIAGNOSIS — Z8719 Personal history of other diseases of the digestive system: Secondary | ICD-10-CM

## 2023-10-12 DIAGNOSIS — F5089 Other specified eating disorder: Secondary | ICD-10-CM | POA: Diagnosis not present

## 2023-10-12 DIAGNOSIS — O26842 Uterine size-date discrepancy, second trimester: Secondary | ICD-10-CM

## 2023-10-12 DIAGNOSIS — O99343 Other mental disorders complicating pregnancy, third trimester: Secondary | ICD-10-CM | POA: Diagnosis not present

## 2023-10-12 DIAGNOSIS — Z6839 Body mass index (BMI) 39.0-39.9, adult: Secondary | ICD-10-CM

## 2023-10-12 DIAGNOSIS — Z8759 Personal history of other complications of pregnancy, childbirth and the puerperium: Secondary | ICD-10-CM

## 2023-10-12 DIAGNOSIS — Z3483 Encounter for supervision of other normal pregnancy, third trimester: Secondary | ICD-10-CM

## 2023-10-12 DIAGNOSIS — L299 Pruritus, unspecified: Secondary | ICD-10-CM

## 2023-10-12 DIAGNOSIS — Z3480 Encounter for supervision of other normal pregnancy, unspecified trimester: Secondary | ICD-10-CM

## 2023-10-12 MED ORDER — PANTOPRAZOLE SODIUM 20 MG PO TBEC: 20.0000 mg | DELAYED_RELEASE_TABLET | Freq: Every day | ORAL | 1 refills | Status: DC

## 2023-10-12 NOTE — Progress Notes (Signed)
 LOW-RISK PREGNANCY VISIT Patient name: Stacey Cook MRN [id number]  Date of birth: 1995/07/07 Chief Complaint:   Routine Prenatal Visit (Still itching-feet, chest)  History of Present Illness:   Stacey Cook is a 29 y.o. [phone] female at [redacted]w[redacted]d with an Estimated Date of Delivery: 12/08/23 being seen today for ongoing management of a low-risk pregnancy.   Today she reports itching feet, chest. Wants to smell alcohol, sharpies- felt the same way w/ ICP previous pregnancy. Reflux, TUMS not helping. Contractions: Not present. Vag. Bleeding: None.  Movement: Present. denies leaking of fluid.     09/14/2023    9:12 AM 05/26/2023   10:38 AM 11/26/2022   10:11 AM 05/15/2020   10:30 AM 11/28/2019    9:56 AM  Depression screen PHQ 2/9  Decreased Interest 1 1 1 2 2   Down, Depressed, Hopeless 1 0 2 3 1   PHQ - 2 Score 2 1 3 5 3   Altered sleeping 2 1 3 1 3   Tired, decreased energy 2 2 3 1 3   Change in appetite 3 3 3 2 1   Feeling bad or failure about yourself  2 0 2 3 1   Trouble concentrating 1 1 3 1 2   Moving slowly or fidgety/restless 3 1 1 1  0  Suicidal thoughts 0 0 0 3 0  PHQ-9 Score 15 9 18 17 13   Difficult doing work/chores     Somewhat difficult        09/14/2023    9:12 AM 05/26/2023   10:38 AM 11/26/2022   10:12 AM 05/15/2020   10:33 AM  GAD 7 : Generalized Anxiety Score  Nervous, Anxious, on Edge 0 0 2 1  Control/stop worrying 0 0 3 1  Worry too much - different things 1 0 3 3  Trouble relaxing 2 2 3 1   Restless 2 2 0 0  Easily annoyed or irritable 1 3 3 3   Afraid - awful might happen 0 0 0 3  Total GAD 7 Score 6 7 14 12       Review of Systems:   Pertinent items are noted in HPI Denies abnormal vaginal discharge w/ itching/odor/irritation, headaches, visual changes, shortness of breath, chest pain, abdominal pain, severe nausea/vomiting, or problems with urination or bowel movements unless otherwise stated above. Pertinent History Reviewed:  Reviewed past medical,surgical,  social, obstetrical and family history.  Reviewed problem list, medications and allergies. Physical Assessment:   Vitals:   10/12/23 1055  BP: 104/65  Pulse: 93  Weight: 199 lb (90.3 kg)  Body mass index is 40.19 kg/m.        Physical Examination:   General appearance: Well appearing, and in no distress  Mental status: Alert, oriented to person, place, and time  Skin: Warm & dry  Cardiovascular: Normal heart rate noted  Respiratory: Normal respiratory effort, no distress  Abdomen: Soft, gravid, nontender  Pelvic: Cervical exam deferred         Extremities: Edema: Trace  Fetal Status:     Movement: Present   US : GA = 31+6 weeks Single active female fetus, cephalic, FHR = 149 bpm, anterior pl , gr1, AFI = 14.4 cm, 49%, MVP = 5.4 cm, EFW 47%, 1907g, BPP = 8/8 (performed d/t symptoms and Hx of Cholestasis), nl ov's, cervix appears closed  Chaperone: N/A Results for orders placed or performed during the hospital encounter of 10/11/23 (from the past 24 hours)  Urinalysis, Routine w reflex microscopic -Urine, Clean Catch   Collection Time: 10/11/23  9:28 PM  Result Value Ref Range   Color, Urine YELLOW YELLOW   APPearance CLOUDY (A) CLEAR   Specific Gravity, Urine 1.023 1.005 - 1.030   pH 6.0 5.0 - 8.0   Glucose, UA NEGATIVE NEGATIVE mg/dL   Hgb urine dipstick NEGATIVE NEGATIVE   Bilirubin Urine NEGATIVE NEGATIVE   Ketones, ur NEGATIVE NEGATIVE mg/dL   Protein, ur NEGATIVE NEGATIVE mg/dL   Nitrite NEGATIVE NEGATIVE   Leukocytes,Ua MODERATE (A) NEGATIVE   RBC / HPF 0-5 0 - 5 RBC/hpf   WBC, UA 6-10 0 - 5 WBC/hpf   Bacteria, UA FEW (A) NONE SEEN   Squamous Epithelial / HPF 11-20 0 - 5 /HPF   Mucus PRESENT     Assessment & Plan:  1) Low-risk pregnancy [id number] at [redacted]w[redacted]d with an Estimated Date of Delivery: 12/08/23   2) Itching w/ h/o ICP, labs today  3) 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    Dispense:  30 tablet    Refill:  1   Labs/procedures today: U/S and labs, declined tdap  Plan:  Continue routine obstetrical care  Next visit: prefers in person    Reviewed: Preterm labor symptoms and general obstetric precautions including but not limited to vaginal bleeding, contractions, leaking of fluid and fetal movement were reviewed in detail with the patient.  All questions were answered. Does have home bp cuff. Office bp cuff given: not applicable. Check bp weekly, let us  know if consistently >140 and/or >90.  Follow-up: Return in about 2 weeks (around 10/26/2023) for LROB, CNM, in person; then 4wks for EFW and LROB w/ CNM.  Future Appointments  Date Time Provider Department Center  10/26/2023  9:50 AM Ferd Householder, CNM [facility]    Orders Placed This Encounter  Procedures   CBC   Comprehensive metabolic panel with GFR   Bile acids, total   Ferd Householder CNM, [facility] 10/12/2023 11:23 AM

## 2023-10-12 NOTE — Patient Instructions (Signed)
 Stacey Cook, thank you for choosing our office today! We appreciate the opportunity to meet your healthcare needs. You may receive a short survey by mail, e-mail, or through [employer]. If you are happy with your care we would appreciate if you could take just a few minutes to complete the survey questions. We read all of your comments and take your feedback very seriously. Thank you again for choosing our office.  Center for [employer] Team at [facility]  [facility] at [facility] ([address] [place], [place] [address]) Entrance C, located off of [address] Free 24/7 valet parking   CLASSES: Go to [email or web address] to register for classes (childbirth, breastfeeding, waterbirth, infant CPR, daddy bootcamp, etc.)  Call the office [phone]) or go to [facility] if: You begin to have strong, frequent contractions Your water breaks.  Sometimes it is a big gush of fluid, sometimes it is just a trickle that keeps getting your panties wet or running down your legs You have vaginal bleeding.  It is normal to have a small amount of spotting if your cervix was checked.  You don't feel your baby moving like normal.  If you don't, get you something to eat and drink and lay down and focus on feeling your baby move.   If your baby is still not moving like normal, you should call the office or go to [facility].  Call the office [phone]) or go to [facility] hospital for these signs of pre-eclampsia: Severe headache that does not go away with Tylenol Visual changes- seeing spots, double, blurred vision Pain under your right breast or upper abdomen that does not go away with Tums or heartburn medicine Nausea and/or vomiting Severe swelling in your hands, feet, and face   Tdap Vaccine It is recommended that you get the Tdap vaccine during the third trimester of EACH pregnancy to help protect your baby from getting pertussis (whooping cough) 27-36 weeks is the BEST time to do  this so that you can pass the protection on to your baby. During pregnancy is better than after pregnancy, but if you are unable to get it during pregnancy it will be offered at the hospital.  You can get this vaccine with [name], at the health department, your family doctor, or some local pharmacies Everyone who will be around your baby should also be up-to-date on their vaccines before the baby comes. Adults (who are not pregnant) only need 1 dose of Tdap during adulthood.   [facility] Pediatricians/Family Doctors [facility]): [phone] Dr. Colette Ribas, [phone]           [facility]: [address], [phone]                [facility]): [address], [phone] (call to ask if accepting patients) [facility] Department: [address], [place], [phone]    [facility] Pediatricians/Family Doctors [facility]): [phone] [address], [phone] [facility]: [address] [place], [phone] [facility]: [address], [phone]  [facility] Doctors  [facility]): [phone] [facility]: [address] [phone]   [facility] Doctors [facility]: [address], [phone]  [facility] Family Doctors  [facility]: [phone], [phone]  Home Blood Pressure Monitoring for Patients   Your provider has recommended that you check your  blood pressure (BP) at least once a week at home. If you do not have a blood pressure cuff at home, one will be provided for you. Contact your provider if you have not received your monitor within 1 week.   Helpful Tips for Accurate Home Blood Pressure Checks  Don't smoke, exercise, or drink caffeine 30 minutes before checking your BP Use the restroom before checking your BP (a full bladder can raise your  pressure) Relax in a comfortable upright chair Feet on the ground Left arm resting comfortably on a flat surface at the level of your heart Legs uncrossed Back supported Sit quietly and don't talk Place the cuff on your bare arm Adjust snuggly, so that only two fingertips can fit between your skin and the top of the cuff Check 2 readings separated by at least one minute Keep a log of your BP readings For a visual, please reference this diagram: http://ccnc.care/bpdiagram  Provider Name: [facility]     Phone: [phone]  Zone 1: ALL CLEAR  Continue to monitor your symptoms:  BP reading is less than 140 (top number) or less than 90 (bottom number)  No right upper stomach pain No headaches or seeing spots No feeling nauseated or throwing up No swelling in face and hands  Zone 2: CAUTION Call your doctor's office for any of the following:  BP reading is greater than 140 (top number) or greater than 90 (bottom number)  Stomach pain under your ribs in the middle or right side Headaches or seeing spots Feeling nauseated or throwing up Swelling in face and hands  Zone 3: EMERGENCY  Seek immediate medical care if you have any of the following:  BP reading is greater than160 (top number) or greater than 110 (bottom number) Severe headaches not improving with Tylenol Serious difficulty catching your breath Any worsening symptoms from Zone 2  Preterm Labor and Birth Information  The normal length of a pregnancy is 39-41 weeks. Preterm labor is when labor starts before 37 completed weeks of pregnancy. What are the risk factors for preterm labor? Preterm labor is more likely to occur in women who: Have certain infections during pregnancy such as a bladder infection, sexually transmitted infection, or infection inside the uterus (chorioamnionitis). Have a shorter-than-normal cervix. Have gone into preterm labor before. Have had surgery on their cervix. Are younger than age 74  or older than age 49. Are African American. Are pregnant with twins or multiple babies (multiple gestation). Take street drugs or smoke while pregnant. Do not gain enough weight while pregnant. Became pregnant shortly after having been pregnant. What are the symptoms of preterm labor? Symptoms of preterm labor include: Cramps similar to those that can happen during a menstrual period. The cramps may happen with diarrhea. Pain in the abdomen or lower back. Regular uterine contractions that may feel like tightening of the abdomen. A feeling of increased pressure in the pelvis. Increased watery or bloody mucus discharge from the vagina. Water breaking (ruptured amniotic sac). Why is it important to recognize signs of preterm labor? It is important to recognize signs of preterm labor because babies who are born prematurely may not be fully developed. This can put them at an increased risk for: Long-term (chronic) heart and lung problems. Difficulty immediately after birth with regulating body systems, including blood sugar, body temperature, heart rate, and breathing rate. Bleeding in the brain. Cerebral palsy. Learning difficulties. Death. These risks are highest for babies who are born before 34 weeks  of pregnancy. How is preterm labor treated? Treatment depends on the length of your pregnancy, your condition, and the health of your baby. It may involve: Having a stitch (suture) placed in your cervix to prevent your cervix from opening too early (cerclage). Taking or being given medicines, such as: Hormone medicines. These may be given early in pregnancy to help support the pregnancy. Medicine to stop contractions. Medicines to help mature the baby's lungs. These may be prescribed if the risk of delivery is high. Medicines to prevent your baby from developing cerebral palsy. If the labor happens before 34 weeks of pregnancy, you may need to stay in the hospital. What should I do if I  think I am in preterm labor? If you think that you are going into preterm labor, call your health care provider right away. How can I prevent preterm labor in future pregnancies? To increase your chance of having a full-term pregnancy: Do not use any tobacco products, such as cigarettes, chewing tobacco, and e-cigarettes. If you need help quitting, ask your health care provider. Do not use street drugs or medicines that have not been prescribed to you during your pregnancy. Talk with your health care provider before taking any herbal supplements, even if you have been taking them regularly. Make sure you gain a healthy amount of weight during your pregnancy. Watch for infection. If you think that you might have an infection, get it checked right away. Make sure to tell your health care provider if you have gone into preterm labor before. This information is not intended to replace advice given to you by your health care provider. Make sure you discuss any questions you have with your health care provider. Document Revised: 08/18/2018 Document Reviewed: 09/17/2015 Elsevier Patient Education  2020 [employer].

## 2023-10-12 NOTE — Progress Notes (Signed)
 US : GA = 31+6 weeks Single active female fetus, cephalic, FHR = 149 bpm, anterior pl , gr1, AFI = 14.4 cm, 49%, MVP = 5.4 cm, EFW 47%, 1907g, BPP = 8/8 (performed d/t symptoms and Hx of Cholestasis), nl ov's, cervix appears closed

## 2023-10-14 LAB — COMPREHENSIVE METABOLIC PANEL WITH GFR
ALT: 16 IU/L (ref 0–32)
AST: 19 IU/L (ref 0–40)
Albumin: 3.8 g/dL — ABNORMAL LOW (ref 4.0–5.0)
Alkaline Phosphatase: 164 IU/L — ABNORMAL HIGH (ref 44–121)
BUN/Creatinine Ratio: 15 (ref 9–23)
BUN: 6 mg/dL (ref 6–20)
Bilirubin Total: <0.2 mg/dL (ref 0.0–1.2)
CO2: 18 mmol/L — ABNORMAL LOW (ref 20–29)
Calcium: 9.3 mg/dL (ref 8.7–10.2)
Chloride: 102 mmol/L (ref 96–106)
Creatinine, Ser: 0.39 mg/dL — ABNORMAL LOW (ref 0.57–1.00)
Globulin, Total: 2.7 g/dL (ref 1.5–4.5)
Glucose: 80 mg/dL (ref 70–99)
Potassium: 4.3 mmol/L (ref 3.5–5.2)
Sodium: 136 mmol/L (ref 134–144)
Total Protein: 6.5 g/dL (ref 6.0–8.5)
eGFR: 142 mL/min/{1.73_m2} (ref >59)

## 2023-10-14 LAB — CBC
Hematocrit: 36.2 % (ref 34.0–46.6)
Hemoglobin: 12.1 g/dL (ref 11.1–15.9)
MCH: 30.8 pg (ref 26.6–33.0)
MCHC: 33.4 g/dL (ref 31.5–35.7)
MCV: 92 fL (ref 79–97)
Platelets: 188 10*3/uL (ref 150–450)
RBC: 3.93 x10E6/uL (ref 3.77–5.28)
RDW: 13.9 % (ref 11.7–15.4)
WBC: 11.2 10*3/uL — ABNORMAL HIGH (ref 3.4–10.8)

## 2023-10-14 LAB — BILE ACIDS, TOTAL: Bile Acids Total: 5.5 umol/L (ref 0.0–10.0)

## 2023-10-17 ENCOUNTER — Ambulatory Visit: Payer: Self-pay | Admitting: Women's Health

## 2023-10-26 ENCOUNTER — Other Ambulatory Visit (HOSPITAL_COMMUNITY)
Admission: RE | Admit: 2023-10-26 | Discharge: 2023-10-26 | Disposition: A | Discharge status: Home / Self Care | Source: Ambulatory Visit | Attending: Women's Health | Admitting: Women's Health

## 2023-10-26 ENCOUNTER — Ambulatory Visit: Admitting: Women's Health

## 2023-10-26 ENCOUNTER — Encounter: Payer: Self-pay | Admitting: Women's Health

## 2023-10-26 VITALS — BP 99/64 | HR 96 | Wt 200.0 lb

## 2023-10-26 DIAGNOSIS — Z8759 Personal history of other complications of pregnancy, childbirth and the puerperium: Secondary | ICD-10-CM | POA: Diagnosis not present

## 2023-10-26 DIAGNOSIS — Z3A33 33 weeks gestation of pregnancy: Secondary | ICD-10-CM | POA: Diagnosis not present

## 2023-10-26 DIAGNOSIS — Z3483 Encounter for supervision of other normal pregnancy, third trimester: Secondary | ICD-10-CM

## 2023-10-26 DIAGNOSIS — N898 Other specified noninflammatory disorders of vagina: Secondary | ICD-10-CM | POA: Insufficient documentation

## 2023-10-26 DIAGNOSIS — Z6841 Body Mass Index (BMI) 40.0 and over, adult: Secondary | ICD-10-CM

## 2023-10-26 NOTE — Patient Instructions (Signed)
 Stacey Cook, thank you for choosing our office today! We appreciate the opportunity to meet your healthcare needs. You may receive a short survey by mail, e-mail, or through [employer]. If you are happy with your care we would appreciate if you could take just a few minutes to complete the survey questions. We read all of your comments and take your feedback very seriously. Thank you again for choosing our office.  Center for [employer] Team at [facility]  [facility] at [facility] ([address] [place], [place] [address]) Entrance C, located off of [address] Free 24/7 valet parking   CLASSES: Go to [email or web address] to register for classes (childbirth, breastfeeding, waterbirth, infant CPR, daddy bootcamp, etc.)  Call the office [phone]) or go to [facility] if: You begin to have strong, frequent contractions Your water breaks.  Sometimes it is a big gush of fluid, sometimes it is just a trickle that keeps getting your panties wet or running down your legs You have vaginal bleeding.  It is normal to have a small amount of spotting if your cervix was checked.  You don't feel your baby moving like normal.  If you don't, get you something to eat and drink and lay down and focus on feeling your baby move.   If your baby is still not moving like normal, you should call the office or go to [facility].  Call the office [phone]) or go to [facility] hospital for these signs of pre-eclampsia: Severe headache that does not go away with Tylenol Visual changes- seeing spots, double, blurred vision Pain under your right breast or upper abdomen that does not go away with Tums or heartburn medicine Nausea and/or vomiting Severe swelling in your hands, feet, and face   Tdap Vaccine It is recommended that you get the Tdap vaccine during the third trimester of EACH pregnancy to help protect your baby from getting pertussis (whooping cough) 27-36 weeks is the BEST time to do  this so that you can pass the protection on to your baby. During pregnancy is better than after pregnancy, but if you are unable to get it during pregnancy it will be offered at the hospital.  You can get this vaccine with [name], at the health department, your family doctor, or some local pharmacies Everyone who will be around your baby should also be up-to-date on their vaccines before the baby comes. Adults (who are not pregnant) only need 1 dose of Tdap during adulthood.   [facility] Pediatricians/Family Doctors [facility]): [phone] Dr. Colette Ribas, [phone]           [facility]: [address], [phone]                [facility]): [address], [phone] (call to ask if accepting patients) [facility] Department: [address], [place], [phone]    [facility] Pediatricians/Family Doctors [facility]): [phone] [address], [phone] [facility]: [address] [place], [phone] [facility]: [address], [phone]  [facility] Doctors  [facility]): [phone] [facility]: [address] [phone]   [facility] Doctors [facility]: [address], [phone]  [facility] Family Doctors  [facility]: [phone], [phone]  Home Blood Pressure Monitoring for Patients   Your provider has recommended that you check your  blood pressure (BP) at least once a week at home. If you do not have a blood pressure cuff at home, one will be provided for you. Contact your provider if you have not received your monitor within 1 week.   Helpful Tips for Accurate Home Blood Pressure Checks  Don't smoke, exercise, or drink caffeine 30 minutes before checking your BP Use the restroom before checking your BP (a full bladder can raise your  pressure) Relax in a comfortable upright chair Feet on the ground Left arm resting comfortably on a flat surface at the level of your heart Legs uncrossed Back supported Sit quietly and don't talk Place the cuff on your bare arm Adjust snuggly, so that only two fingertips can fit between your skin and the top of the cuff Check 2 readings separated by at least one minute Keep a log of your BP readings For a visual, please reference this diagram: http://ccnc.care/bpdiagram  Provider Name: [facility]     Phone: [phone]  Zone 1: ALL CLEAR  Continue to monitor your symptoms:  BP reading is less than 140 (top number) or less than 90 (bottom number)  No right upper stomach pain No headaches or seeing spots No feeling nauseated or throwing up No swelling in face and hands  Zone 2: CAUTION Call your doctor's office for any of the following:  BP reading is greater than 140 (top number) or greater than 90 (bottom number)  Stomach pain under your ribs in the middle or right side Headaches or seeing spots Feeling nauseated or throwing up Swelling in face and hands  Zone 3: EMERGENCY  Seek immediate medical care if you have any of the following:  BP reading is greater than160 (top number) or greater than 110 (bottom number) Severe headaches not improving with Tylenol Serious difficulty catching your breath Any worsening symptoms from Zone 2  Preterm Labor and Birth Information  The normal length of a pregnancy is 39-41 weeks. Preterm labor is when labor starts before 37 completed weeks of pregnancy. What are the risk factors for preterm labor? Preterm labor is more likely to occur in women who: Have certain infections during pregnancy such as a bladder infection, sexually transmitted infection, or infection inside the uterus (chorioamnionitis). Have a shorter-than-normal cervix. Have gone into preterm labor before. Have had surgery on their cervix. Are younger than age 74  or older than age 49. Are African American. Are pregnant with twins or multiple babies (multiple gestation). Take street drugs or smoke while pregnant. Do not gain enough weight while pregnant. Became pregnant shortly after having been pregnant. What are the symptoms of preterm labor? Symptoms of preterm labor include: Cramps similar to those that can happen during a menstrual period. The cramps may happen with diarrhea. Pain in the abdomen or lower back. Regular uterine contractions that may feel like tightening of the abdomen. A feeling of increased pressure in the pelvis. Increased watery or bloody mucus discharge from the vagina. Water breaking (ruptured amniotic sac). Why is it important to recognize signs of preterm labor? It is important to recognize signs of preterm labor because babies who are born prematurely may not be fully developed. This can put them at an increased risk for: Long-term (chronic) heart and lung problems. Difficulty immediately after birth with regulating body systems, including blood sugar, body temperature, heart rate, and breathing rate. Bleeding in the brain. Cerebral palsy. Learning difficulties. Death. These risks are highest for babies who are born before 34 weeks  of pregnancy. How is preterm labor treated? Treatment depends on the length of your pregnancy, your condition, and the health of your baby. It may involve: Having a stitch (suture) placed in your cervix to prevent your cervix from opening too early (cerclage). Taking or being given medicines, such as: Hormone medicines. These may be given early in pregnancy to help support the pregnancy. Medicine to stop contractions. Medicines to help mature the baby's lungs. These may be prescribed if the risk of delivery is high. Medicines to prevent your baby from developing cerebral palsy. If the labor happens before 34 weeks of pregnancy, you may need to stay in the hospital. What should I do if I  think I am in preterm labor? If you think that you are going into preterm labor, call your health care provider right away. How can I prevent preterm labor in future pregnancies? To increase your chance of having a full-term pregnancy: Do not use any tobacco products, such as cigarettes, chewing tobacco, and e-cigarettes. If you need help quitting, ask your health care provider. Do not use street drugs or medicines that have not been prescribed to you during your pregnancy. Talk with your health care provider before taking any herbal supplements, even if you have been taking them regularly. Make sure you gain a healthy amount of weight during your pregnancy. Watch for infection. If you think that you might have an infection, get it checked right away. Make sure to tell your health care provider if you have gone into preterm labor before. This information is not intended to replace advice given to you by your health care provider. Make sure you discuss any questions you have with your health care provider. Document Revised: 08/18/2018 Document Reviewed: 09/17/2015 Elsevier Patient Education  2020 [employer].

## 2023-10-26 NOTE — Progress Notes (Signed)
 Aaron Aas   LOW-RISK PREGNANCY VISIT Patient name: Stacey Cook MRN [id number]  Date of birth: 1995-11-21 Chief Complaint:   Routine Prenatal Visit  History of Present Illness:   Stacey Cook is a 28 y.o. [id number] female at [redacted]w[redacted]d with an Estimated Date of Delivery: 12/08/23 being seen today for ongoing management of a low-risk pregnancy.   Today she reports pressure, lightning crotch, vaginal d/c w/ slight odor. Contractions: Irritability. Vag. Bleeding: None.  Movement: Present. denies leaking of fluid.     09/14/2023    9:12 AM 05/26/2023   10:38 AM 11/26/2022   10:11 AM 05/15/2020   10:30 AM 11/28/2019    9:56 AM  Depression screen PHQ 2/9  Decreased Interest 1 1 1 2 2   Down, Depressed, Hopeless 1 0 2 3 1   PHQ - 2 Score 2 1 3 5 3   Altered sleeping 2 1 3 1 3   Tired, decreased energy 2 2 3 1 3   Change in appetite 3 3 3 2 1   Feeling bad or failure about yourself  2 0 2 3 1   Trouble concentrating 1 1 3 1 2   Moving slowly or fidgety/restless 3 1 1 1  0  Suicidal thoughts 0 0 0 3 0  PHQ-9 Score 15 9 18 17 13   Difficult doing work/chores     Somewhat difficult        09/14/2023    9:12 AM 05/26/2023   10:38 AM 11/26/2022   10:12 AM 05/15/2020   10:33 AM  GAD 7 : Generalized Anxiety Score  Nervous, Anxious, on Edge 0 0 2 1  Control/stop worrying 0 0 3 1  Worry too much - different things 1 0 3 3  Trouble relaxing 2 2 3 1   Restless 2 2 0 0  Easily annoyed or irritable 1 3 3 3   Afraid - awful might happen 0 0 0 3  Total GAD 7 Score 6 7 14 12       Review of Systems:   Pertinent items are noted in HPI Denies abnormal vaginal discharge w/ itching/odor/irritation, headaches, visual changes, shortness of breath, chest pain, abdominal pain, severe nausea/vomiting, or problems with urination or bowel movements unless otherwise stated above. Pertinent History Reviewed:  Reviewed past medical,surgical, social, obstetrical and family history.  Reviewed problem list, medications and  allergies. Physical Assessment:   Vitals:   10/26/23 1010  BP: 99/64  Pulse: 96  Weight: 200 lb (90.7 kg)  Body mass index is 40.4 kg/m.        Physical Examination:   General appearance: Well appearing, and in no distress  Mental status: Alert, oriented to person, place, and time  Skin: Warm & dry  Cardiovascular: Normal heart rate noted  Respiratory: Normal respiratory effort, no distress  Abdomen: Soft, gravid, nontender  Pelvic: CV swab, SVE per request: cl/th/high  Dilation: Closed Effacement (%): Thick Station: Ballotable  Extremities:    Fetal Status: Fetal Heart Rate (bpm): 154 Fundal Height: 32 cm Movement: Present    Chaperone: Lorean Rodes No results found for this or any previous visit (from the past 24 hours).  Assessment & Plan:  1) Low-risk pregnancy [id number] at 103w6d with an Estimated Date of Delivery: 12/08/23   2) Prev vag then C/S then VBAC, wants TOLAC  3) H/O shoulder dystocia> EFW next visit  4) Vaginal d/c w/ slight odor> CV swab   Meds: No orders of the defined types were placed in this encounter.  Labs/procedures today: CV swab and SVE  Plan:  Continue routine obstetrical care  Next visit: prefers will be in person for u/s    Reviewed: Preterm labor symptoms and general obstetric precautions including but not limited to vaginal bleeding, contractions, leaking of fluid and fetal movement were reviewed in detail with the patient.  All questions were answered. Does have home bp cuff. Office bp cuff given: not applicable. Check bp weekly, let us  know if consistently >140 and/or >90.  Follow-up: Return for As scheduled.  Future Appointments  Date Time Provider Department Center  11/09/2023  9:15 AM [facility] US  [facility] None  11/09/2023 10:10 AM Ozan, Jennifer, DO [facility]    Orders Placed This Encounter  Procedures   US  OB Follow Up   Ferd Householder CNM, [facility] 10/26/2023 10:25 AM

## 2023-10-27 LAB — CERVICOVAGINAL ANCILLARY ONLY
Bacterial Vaginitis (gardnerella): POSITIVE — AB
Candida Glabrata: NEGATIVE
Candida Vaginitis: POSITIVE — AB
Chlamydia: NEGATIVE
Comment: NEGATIVE
Comment: NEGATIVE
Comment: NEGATIVE
Comment: NEGATIVE
Comment: NEGATIVE
Comment: NORMAL
Neisseria Gonorrhea: NEGATIVE
Trichomonas: NEGATIVE

## 2023-10-31 ENCOUNTER — Ambulatory Visit: Payer: Self-pay | Admitting: Women's Health

## 2023-10-31 MED ORDER — TERCONAZOLE 0.4 % VA CREA: 1.0000 | TOPICAL_CREAM | Freq: Every day | VAGINAL | 0 refills | Status: DC

## 2023-10-31 MED ORDER — METRONIDAZOLE 500 MG PO TABS: 500.0000 mg | ORAL_TABLET | Freq: Two times a day (BID) | ORAL | 0 refills | Status: DC

## 2023-11-03 ENCOUNTER — Encounter: Payer: Self-pay | Admitting: Obstetrics & Gynecology

## 2023-11-09 ENCOUNTER — Other Ambulatory Visit (HOSPITAL_COMMUNITY)
Admission: RE | Admit: 2023-11-09 | Discharge: 2023-11-09 | Disposition: A | Discharge status: Home / Self Care | Source: Ambulatory Visit | Attending: Obstetrics & Gynecology | Admitting: Obstetrics & Gynecology

## 2023-11-09 ENCOUNTER — Ambulatory Visit: Admitting: Obstetrics & Gynecology

## 2023-11-09 ENCOUNTER — Encounter: Payer: Self-pay | Admitting: Obstetrics & Gynecology

## 2023-11-09 ENCOUNTER — Ambulatory Visit

## 2023-11-09 VITALS — BP 118/76 | HR 90 | Wt 199.6 lb

## 2023-11-09 DIAGNOSIS — O34211 Maternal care for low transverse scar from previous cesarean delivery: Secondary | ICD-10-CM

## 2023-11-09 DIAGNOSIS — Z3480 Encounter for supervision of other normal pregnancy, unspecified trimester: Secondary | ICD-10-CM

## 2023-11-09 DIAGNOSIS — O99334 Smoking (tobacco) complicating childbirth: Secondary | ICD-10-CM

## 2023-11-09 DIAGNOSIS — Z3A35 35 weeks gestation of pregnancy: Secondary | ICD-10-CM

## 2023-11-09 DIAGNOSIS — Z8759 Personal history of other complications of pregnancy, childbirth and the puerperium: Secondary | ICD-10-CM

## 2023-11-09 DIAGNOSIS — Z3483 Encounter for supervision of other normal pregnancy, third trimester: Secondary | ICD-10-CM

## 2023-11-09 DIAGNOSIS — Z8719 Personal history of other diseases of the digestive system: Secondary | ICD-10-CM

## 2023-11-09 DIAGNOSIS — Z98891 History of uterine scar from previous surgery: Secondary | ICD-10-CM

## 2023-11-09 DIAGNOSIS — Z6841 Body Mass Index (BMI) 40.0 and over, adult: Secondary | ICD-10-CM

## 2023-11-09 LAB — OB RESULTS CONSOLE GBS: GBS: NEGATIVE

## 2023-11-09 NOTE — Progress Notes (Signed)
 HIGH-RISK PREGNANCY VISIT Patient name: Stacey Cook MRN [id number]  Date of birth: 1995-09-22 Chief Complaint:   Routine Prenatal Visit (EFW)  History of Present Illness:   Stacey Cook is a 28 y.o. [phone] female at [redacted]w[redacted]d with an Estimated Date of Delivery: 12/08/23 being seen today for ongoing management of a high-risk pregnancy complicated by:  -Prior C-section and VBAC, desires TOLAC -prior Shoulder dystocia in first pregnancy -Tobacco use  Today she reports occasional contractions.   Contractions: Irregular. Vag. Bleeding: None.  Movement: Present. denies leaking of fluid.      09/14/2023    9:12 AM 05/26/2023   10:38 AM 11/26/2022   10:11 AM 05/15/2020   10:30 AM 11/28/2019    9:56 AM  Depression screen PHQ 2/9  Decreased Interest 1 1 1 2 2   Down, Depressed, Hopeless 1 0 2 3 1   PHQ - 2 Score 2 1 3 5 3   Altered sleeping 2 1 3 1 3   Tired, decreased energy 2 2 3 1 3   Change in appetite 3 3 3 2 1   Feeling bad or failure about yourself  2 0 2 3 1   Trouble concentrating 1 1 3 1 2   Moving slowly or fidgety/restless 3 1 1 1  0  Suicidal thoughts 0 0 0 3 0  PHQ-9 Score 15 9 18 17 13   Difficult doing work/chores     Somewhat difficult     Current Outpatient Medications  Medication Instructions   acetaminophen  (TYLENOL ) 1,000 mg, Oral, Every 6 hours PRN   albuterol  (PROVENTIL ) 2.5 mg, See admin instructions   albuterol  (VENTOLIN  HFA) 108 (90 Base) MCG/ACT inhaler 1-2 puffs, Inhalation, Every 6 hours PRN   Blood Pressure Monitor MISC For regular home bp monitoring during pregnancy   cyclobenzaprine  (FLEXERIL ) 10 mg, Oral, 3 times daily PRN   Doxylamine -Pyridoxine  (DICLEGIS ) 10-10 MG TBEC 2 tabs q hs, if sx persist add 1 tab q am on day 3, if sx persist add 1 tab q afternoon on day 4   hydrocortisone  cream 1 % 1 Application, 2 times daily   hydrOXYzine  (ATARAX ) 25 mg, Oral, Every 6 hours PRN   metroNIDAZOLE  (FLAGYL ) 500 mg, Oral, 2 times daily   pantoprazole  (PROTONIX ) 20 mg,  Oral, Daily   prenatal vitamin w/FE, FA (PRENATAL 1 + 1) 27-1 MG TABS tablet 1 tablet, Oral, Daily   terconazole  (TERAZOL 7 ) 0.4 % vaginal cream 1 applicator, Vaginal, Daily at bedtime     Review of Systems:   Pertinent items are noted in HPI Denies abnormal vaginal discharge w/ itching/odor/irritation, headaches, visual changes, shortness of breath, chest pain, abdominal pain, severe nausea/vomiting, or problems with urination or bowel movements unless otherwise stated above. Pertinent History Reviewed:  Reviewed past medical,surgical, social, obstetrical and family history.  Reviewed problem list, medications and allergies. Physical Assessment:   Vitals:   11/09/23 1002  BP: 118/76  Pulse: 90  Weight: 199 lb 9.6 oz (90.5 kg)  Body mass index is 40.31 kg/m.           Physical Examination:   General appearance: alert, well appearing, and in no distress  Mental status: normal mood, behavior, speech, dress, motor activity, and thought processes  Skin: warm & dry   Extremities: Edema: Trace    Cardiovascular: normal heart rate noted  Respiratory: normal respiratory effort, no distress  Abdomen: gravid, soft, non-tender  Pelvic: Cervical exam performed  Closed/thick/high     vaginal swabs collected  Fetal Status:  Movement: Present    Fetal Surveillance Testing today: cephalic,anterior placenta gr 1,FHR 135 BPM,AFI 17 cm,EFW 3069 g 78%,BPD 99%,AC 90%    Chaperone: Latisha Cresenzo    No results found for this or any previous visit (from the past 24 hours).   Assessment & Plan:  High-risk pregnancy: [id number] at [redacted]w[redacted]d with an Estimated Date of Delivery: 12/08/23   1) Considering TOLAC - Second pregnancy- elective P CS due to suspected LGA and h/o shoulder dystocia -3rd pregnancy delivery @ 37wk due to ICP, baby was ~ 6# -reviewed today's US , pt concerned about recurrence of shoulder dystocia @ 39wks -will discuss with MFM for consideration of IOL @ 37wk  -consent signed for  TOLAC today - We discussed her history of c-section. Her previous c-section was due to  prior shoulder dystocia.  She has a history of  successful vaginal delivery - We discussed the risks associated with repeat c-section: bleeding, infection, injury to surrounding organs/tissues I.e. bowel/bladder, development of scar tissue, wound complications such as wound separation or infection, need for additional surgery and discussed potential for future complications such as placenta accreta spectrum  nm- We discussed the risks associated with TOLAC specifically focusing on the risk of uterine rupture. We discussed with the risk of uterine rupture that while rare it is not easily predicted, that it is a surgical emergency, and it can be potentially catastrophic for mom and baby.  - After counseling, the patient was given the opportunity to ask questions and all questions answered.  - After considering her options, she would like to [facility] - Information provided to the patient  -Tobacco use  Meds: No orders of the defined types were placed in this encounter.   Labs/procedures today: growth scan, GBS, GC/C  Treatment Plan:  routine OB care and as outlined above  Reviewed: Preterm labor symptoms and general obstetric precautions including but not limited to vaginal bleeding, contractions, leaking of fluid and fetal movement were reviewed in detail with the patient.  All questions were answered.   Follow-up: Return in about 1 week (around 11/16/2023) for LROB visit.   Future Appointments  Date Time Provider Department Center  11/09/2023 10:10 AM Marilynn Nest, DO [facility]    No orders of the defined types were placed in this encounter.   Breah Joa, DO Attending Obstetrician & Gynecologist, [facility] for [employer], [facility]

## 2023-11-09 NOTE — Progress Notes (Signed)
 US  35+6 wks,cephalic,anterior placenta gr 1,FHR 135 BPM,AFI 17 cm,EFW 3069 g 78%,BPD 99%,AC 90%

## 2023-11-10 ENCOUNTER — Ambulatory Visit: Payer: Self-pay | Admitting: Obstetrics & Gynecology

## 2023-11-10 DIAGNOSIS — B9689 Other specified bacterial agents as the cause of diseases classified elsewhere: Secondary | ICD-10-CM

## 2023-11-10 LAB — CERVICOVAGINAL ANCILLARY ONLY
Bacterial Vaginitis (gardnerella): POSITIVE — AB
Candida Glabrata: NEGATIVE
Candida Vaginitis: NEGATIVE
Chlamydia: NEGATIVE
Comment: NEGATIVE
Comment: NEGATIVE
Comment: NEGATIVE
Comment: NEGATIVE
Comment: NORMAL
Neisseria Gonorrhea: NEGATIVE

## 2023-11-10 MED ORDER — METRONIDAZOLE 0.75 % VA GEL: 1.0000 | Freq: Every day | VAGINAL | 0 refills | Status: AC

## 2023-11-14 LAB — STREP GP B SUSCEPTIBILITY

## 2023-11-14 LAB — STREP GP B NAA+RFLX: Strep Gp B NAA+Rflx: POSITIVE — AB

## 2023-11-17 ENCOUNTER — Inpatient Hospital Stay (HOSPITAL_COMMUNITY): Admitting: Anesthesiology

## 2023-11-17 ENCOUNTER — Encounter: Payer: Self-pay | Admitting: Women's Health

## 2023-11-17 ENCOUNTER — Other Ambulatory Visit: Payer: Self-pay

## 2023-11-17 ENCOUNTER — Encounter (HOSPITAL_COMMUNITY): Payer: Self-pay | Admitting: Family Medicine

## 2023-11-17 ENCOUNTER — Inpatient Hospital Stay (HOSPITAL_COMMUNITY)
Admission: AD | Admit: 2023-11-17 | Discharge: 2023-11-19 | DRG: 806 | Disposition: A | Discharge status: Home / Self Care | Attending: Obstetrics and Gynecology | Admitting: Obstetrics and Gynecology

## 2023-11-17 ENCOUNTER — Ambulatory Visit: Admitting: Women's Health

## 2023-11-17 VITALS — BP 110/73 | HR 92 | Wt 199.2 lb

## 2023-11-17 DIAGNOSIS — O34219 Maternal care for unspecified type scar from previous cesarean delivery: Secondary | ICD-10-CM | POA: Diagnosis present

## 2023-11-17 DIAGNOSIS — O9962 Diseases of the digestive system complicating childbirth: Secondary | ICD-10-CM | POA: Diagnosis present

## 2023-11-17 DIAGNOSIS — O9832 Other infections with a predominantly sexual mode of transmission complicating childbirth: Secondary | ICD-10-CM | POA: Diagnosis present

## 2023-11-17 DIAGNOSIS — Z8249 Family history of ischemic heart disease and other diseases of the circulatory system: Secondary | ICD-10-CM | POA: Diagnosis not present

## 2023-11-17 DIAGNOSIS — O9952 Diseases of the respiratory system complicating childbirth: Secondary | ICD-10-CM | POA: Diagnosis present

## 2023-11-17 DIAGNOSIS — A6000 Herpesviral infection of urogenital system, unspecified: Secondary | ICD-10-CM | POA: Diagnosis present

## 2023-11-17 DIAGNOSIS — Z833 Family history of diabetes mellitus: Secondary | ICD-10-CM | POA: Diagnosis not present

## 2023-11-17 DIAGNOSIS — E66813 Obesity, class 3: Secondary | ICD-10-CM | POA: Diagnosis present

## 2023-11-17 DIAGNOSIS — O99824 Streptococcus B carrier state complicating childbirth: Secondary | ICD-10-CM | POA: Diagnosis present

## 2023-11-17 DIAGNOSIS — Z5982 Transportation insecurity: Secondary | ICD-10-CM | POA: Diagnosis not present

## 2023-11-17 DIAGNOSIS — O99820 Streptococcus B carrier state complicating pregnancy: Secondary | ICD-10-CM | POA: Diagnosis not present

## 2023-11-17 DIAGNOSIS — Z3483 Encounter for supervision of other normal pregnancy, third trimester: Secondary | ICD-10-CM | POA: Diagnosis not present

## 2023-11-17 DIAGNOSIS — K219 Gastro-esophageal reflux disease without esophagitis: Secondary | ICD-10-CM | POA: Diagnosis present

## 2023-11-17 DIAGNOSIS — O99214 Obesity complicating childbirth: Secondary | ICD-10-CM | POA: Diagnosis present

## 2023-11-17 DIAGNOSIS — F17210 Nicotine dependence, cigarettes, uncomplicated: Secondary | ICD-10-CM | POA: Diagnosis present

## 2023-11-17 DIAGNOSIS — Z3A37 37 weeks gestation of pregnancy: Secondary | ICD-10-CM

## 2023-11-17 DIAGNOSIS — J4520 Mild intermittent asthma, uncomplicated: Secondary | ICD-10-CM | POA: Diagnosis present

## 2023-11-17 DIAGNOSIS — Z3480 Encounter for supervision of other normal pregnancy, unspecified trimester: Principal | ICD-10-CM

## 2023-11-17 DIAGNOSIS — O26643 Intrahepatic cholestasis of pregnancy, third trimester: Principal | ICD-10-CM | POA: Diagnosis present

## 2023-11-17 DIAGNOSIS — O34211 Maternal care for low transverse scar from previous cesarean delivery: Secondary | ICD-10-CM | POA: Diagnosis not present

## 2023-11-17 DIAGNOSIS — O99334 Smoking (tobacco) complicating childbirth: Secondary | ICD-10-CM | POA: Diagnosis not present

## 2023-11-17 LAB — CBC
HCT: 35.3 % — ABNORMAL LOW (ref 36.0–46.0)
Hemoglobin: 11.9 g/dL — ABNORMAL LOW (ref 12.0–15.0)
MCH: 29.9 pg (ref 26.0–34.0)
MCHC: 33.7 g/dL (ref 30.0–36.0)
MCV: 88.7 fL (ref 80.0–100.0)
Platelets: 181 K/uL (ref 150–400)
RBC: 3.98 MIL/uL (ref 3.87–5.11)
RDW: 14.6 % (ref 11.5–15.5)
WBC: 13.4 K/uL — ABNORMAL HIGH (ref 4.0–10.5)
nRBC: 0 % (ref 0.0–0.2)

## 2023-11-17 LAB — COMPREHENSIVE METABOLIC PANEL WITH GFR
ALT: 23 U/L (ref 0–44)
AST: 28 U/L (ref 15–41)
Albumin: 3 g/dL — ABNORMAL LOW (ref 3.5–5.0)
Alkaline Phosphatase: 183 U/L — ABNORMAL HIGH (ref 38–126)
Anion gap: 11 (ref 5–15)
BUN: 10 mg/dL (ref 6–20)
CO2: 19 mmol/L — ABNORMAL LOW (ref 22–32)
Calcium: 9.4 mg/dL (ref 8.9–10.3)
Chloride: 103 mmol/L (ref 98–111)
Creatinine, Ser: 0.47 mg/dL (ref 0.44–1.00)
GFR, Estimated: >60 mL/min (ref >60)
Glucose, Bld: 81 mg/dL (ref 70–99)
Potassium: 4.3 mmol/L (ref 3.5–5.1)
Sodium: 133 mmol/L — ABNORMAL LOW (ref 135–145)
Total Bilirubin: 0.6 mg/dL (ref 0.0–1.2)
Total Protein: 6.5 g/dL (ref 6.5–8.1)

## 2023-11-17 LAB — TYPE AND SCREEN
ABO/RH(D): A POS
Antibody Screen: NEGATIVE

## 2023-11-17 MED ORDER — EPHEDRINE 5 MG/ML INJ: 10.0000 mg | INTRAVENOUS | Status: DC | PRN

## 2023-11-17 MED ORDER — TERBUTALINE SULFATE 1 MG/ML IJ SOLN: 0.2500 mg | Freq: Once | INTRAMUSCULAR | Status: DC | PRN

## 2023-11-17 MED ORDER — CLINDAMYCIN PHOSPHATE 900 MG/50ML IV SOLN
900.0000 mg | Freq: Three times a day (TID) | INTRAVENOUS | Status: DC
  Administered 2023-11-17: 900 mg via INTRAVENOUS
  Filled 2023-11-17: qty 50

## 2023-11-17 MED ORDER — OXYCODONE-ACETAMINOPHEN 5-325 MG PO TABS: 2.0000 | ORAL_TABLET | ORAL | Status: DC | PRN

## 2023-11-17 MED ORDER — LIDOCAINE-EPINEPHRINE (PF) 2 %-1:200000 IJ SOLN
INTRAMUSCULAR | Status: DC | PRN
  Administered 2023-11-17: 3 mL via EPIDURAL

## 2023-11-17 MED ORDER — ACETAMINOPHEN 325 MG PO TABS
650.0000 mg | ORAL_TABLET | ORAL | Status: DC | PRN
  Administered 2023-11-18 (×4): 650 mg via ORAL
  Filled 2023-11-17 (×5): qty 2

## 2023-11-17 MED ORDER — BISACODYL 10 MG RE SUPP: 10.0000 mg | Freq: Every day | RECTAL | Status: DC | PRN

## 2023-11-17 MED ORDER — LACTATED RINGERS IV SOLN: 500.0000 mL | Freq: Once | INTRAVENOUS | Status: DC

## 2023-11-17 MED ORDER — OXYCODONE-ACETAMINOPHEN 5-325 MG PO TABS: 1.0000 | ORAL_TABLET | ORAL | Status: DC | PRN

## 2023-11-17 MED ORDER — BENZOCAINE-MENTHOL 20-0.5 % EX AERO: 1.0000 | INHALATION_SPRAY | CUTANEOUS | Status: DC | PRN

## 2023-11-17 MED ORDER — MEDROXYPROGESTERONE ACETATE 150 MG/ML IM SUSP: 150.0000 mg | INTRAMUSCULAR | Status: DC | PRN

## 2023-11-17 MED ORDER — ONDANSETRON HCL 4 MG PO TABS
4.0000 mg | ORAL_TABLET | ORAL | Status: DC | PRN
  Administered 2023-11-18: 4 mg via ORAL
  Filled 2023-11-17: qty 1

## 2023-11-17 MED ORDER — SIMETHICONE 80 MG PO CHEW: 80.0000 mg | CHEWABLE_TABLET | ORAL | Status: DC | PRN

## 2023-11-17 MED ORDER — FERROUS SULFATE 325 (65 FE) MG PO TABS
325.0000 mg | ORAL_TABLET | ORAL | Status: DC
  Administered 2023-11-18: 325 mg via ORAL
  Filled 2023-11-17: qty 1

## 2023-11-17 MED ORDER — LACTATED RINGERS IV SOLN: INTRAVENOUS | Status: DC

## 2023-11-17 MED ORDER — FAMOTIDINE 20 MG PO TABS: 20.0000 mg | ORAL_TABLET | Freq: Once | ORAL | Status: DC

## 2023-11-17 MED ORDER — FLEET ENEMA RE ENEM: 1.0000 | ENEMA | RECTAL | Status: DC | PRN

## 2023-11-17 MED ORDER — SOD CITRATE-CITRIC ACID 500-334 MG/5ML PO SOLN: 30.0000 mL | ORAL | Status: DC | PRN

## 2023-11-17 MED ORDER — LACTATED RINGERS IV SOLN: 500.0000 mL | INTRAVENOUS | Status: DC | PRN

## 2023-11-17 MED ORDER — FAMOTIDINE IN NACL 20-0.9 MG/50ML-% IV SOLN
20.0000 mg | Freq: Once | INTRAVENOUS | Status: AC
  Administered 2023-11-17: 20 mg via INTRAVENOUS
  Filled 2023-11-17: qty 50

## 2023-11-17 MED ORDER — OXYTOCIN-SODIUM CHLORIDE 30-0.9 UT/500ML-% IV SOLN
1.0000 m[IU]/min | INTRAVENOUS | Status: DC
  Administered 2023-11-17: 2 m[IU]/min via INTRAVENOUS
  Filled 2023-11-17: qty 500

## 2023-11-17 MED ORDER — IBUPROFEN 600 MG PO TABS
600.0000 mg | ORAL_TABLET | Freq: Four times a day (QID) | ORAL | Status: DC
  Administered 2023-11-18 – 2023-11-19 (×6): 600 mg via ORAL
  Filled 2023-11-17 (×6): qty 1

## 2023-11-17 MED ORDER — FLEET ENEMA RE ENEM: 1.0000 | ENEMA | Freq: Every day | RECTAL | Status: DC | PRN

## 2023-11-17 MED ORDER — LIDOCAINE HCL (PF) 1 % IJ SOLN
INTRAMUSCULAR | Status: DC | PRN
Start: 2023-11-17 — End: 2023-11-17
  Administered 2023-11-17: 3 mL via SUBCUTANEOUS

## 2023-11-17 MED ORDER — ONDANSETRON HCL 4 MG/2ML IJ SOLN
4.0000 mg | Freq: Four times a day (QID) | INTRAMUSCULAR | Status: DC | PRN
  Administered 2023-11-17: 4 mg via INTRAVENOUS
  Filled 2023-11-17: qty 2

## 2023-11-17 MED ORDER — FENTANYL-BUPIVACAINE-NACL 0.5-0.125-0.9 MG/250ML-% EP SOLN
12.0000 mL/h | EPIDURAL | Status: DC | PRN
  Administered 2023-11-17: 12 mL/h via EPIDURAL
  Filled 2023-11-17: qty 250

## 2023-11-17 MED ORDER — LIDOCAINE HCL (PF) 1 % IJ SOLN
INTRAMUSCULAR | Status: DC | PRN
  Administered 2023-11-17: 6 mL

## 2023-11-17 MED ORDER — LIDOCAINE HCL (PF) 1 % IJ SOLN: 30.0000 mL | INTRAMUSCULAR | Status: DC | PRN

## 2023-11-17 MED ORDER — METHYLERGONOVINE MALEATE 0.2 MG/ML IJ SOLN: 0.2000 mg | INTRAMUSCULAR | Status: DC | PRN

## 2023-11-17 MED ORDER — OXYTOCIN BOLUS FROM INFUSION
333.0000 mL | Freq: Once | INTRAVENOUS | Status: AC
  Administered 2023-11-17: 333 mL via INTRAVENOUS

## 2023-11-17 MED ORDER — MEASLES, MUMPS & RUBELLA VAC IJ SOLR: 0.5000 mL | Freq: Once | INTRAMUSCULAR | Status: DC

## 2023-11-17 MED ORDER — DIPHENHYDRAMINE HCL 25 MG PO CAPS
25.0000 mg | ORAL_CAPSULE | Freq: Four times a day (QID) | ORAL | Status: DC | PRN
  Administered 2023-11-18: 25 mg via ORAL
  Filled 2023-11-17: qty 1

## 2023-11-17 MED ORDER — TRANEXAMIC ACID-NACL 1000-0.7 MG/100ML-% IV SOLN
INTRAVENOUS | Status: AC
  Administered 2023-11-17: 1000 mg
  Filled 2023-11-17: qty 100

## 2023-11-17 MED ORDER — SENNOSIDES-DOCUSATE SODIUM 8.6-50 MG PO TABS
2.0000 | ORAL_TABLET | ORAL | Status: DC
  Administered 2023-11-18 – 2023-11-19 (×2): 2 via ORAL
  Filled 2023-11-17 (×2): qty 2

## 2023-11-17 MED ORDER — PHENYLEPHRINE 80 MCG/ML (10ML) SYRINGE FOR IV PUSH (FOR BLOOD PRESSURE SUPPORT): 80.0000 ug | PREFILLED_SYRINGE | INTRAVENOUS | Status: DC | PRN

## 2023-11-17 MED ORDER — ONDANSETRON HCL 4 MG/2ML IJ SOLN: 4.0000 mg | INTRAMUSCULAR | Status: DC | PRN

## 2023-11-17 MED ORDER — FENTANYL CITRATE (PF) 100 MCG/2ML IJ SOLN: 50.0000 ug | INTRAMUSCULAR | Status: DC | PRN

## 2023-11-17 MED ORDER — WITCH HAZEL-GLYCERIN EX PADS: 1.0000 | MEDICATED_PAD | CUTANEOUS | Status: DC | PRN

## 2023-11-17 MED ORDER — ACETAMINOPHEN 325 MG PO TABS
650.0000 mg | ORAL_TABLET | ORAL | Status: DC | PRN
  Administered 2023-11-17: 650 mg via ORAL
  Filled 2023-11-17: qty 2

## 2023-11-17 MED ORDER — PRENATAL MULTIVITAMIN CH
1.0000 | ORAL_TABLET | Freq: Every day | ORAL | Status: DC
  Administered 2023-11-18: 1 via ORAL
  Filled 2023-11-17 (×2): qty 1

## 2023-11-17 MED ORDER — DIBUCAINE (PERIANAL) 1 % EX OINT: 1.0000 | TOPICAL_OINTMENT | CUTANEOUS | Status: DC | PRN

## 2023-11-17 MED ORDER — METHYLERGONOVINE MALEATE 0.2 MG PO TABS: 0.2000 mg | ORAL_TABLET | ORAL | Status: DC | PRN

## 2023-11-17 MED ORDER — DIPHENHYDRAMINE HCL 50 MG/ML IJ SOLN: 12.5000 mg | INTRAMUSCULAR | Status: DC | PRN

## 2023-11-17 MED ORDER — COCONUT OIL OIL: 1.0000 | TOPICAL_OIL | Status: DC | PRN

## 2023-11-17 MED ORDER — OXYTOCIN-SODIUM CHLORIDE 30-0.9 UT/500ML-% IV SOLN
2.5000 [IU]/h | INTRAVENOUS | Status: DC
  Administered 2023-11-17: 2.5 [IU]/h via INTRAVENOUS

## 2023-11-17 MED ORDER — TETANUS-DIPHTH-ACELL PERTUSSIS 5-2.5-18.5 LF-MCG/0.5 IM SUSY: 0.5000 mL | PREFILLED_SYRINGE | Freq: Once | INTRAMUSCULAR | Status: DC

## 2023-11-17 NOTE — Progress Notes (Signed)
 Labor Progress Note  Stacey Cook is a 28 y.o. [phone] at [redacted]w[redacted]d presented for IOL for cholestasis of preg  S: doing well, just itchy   O:  BP 117/65   Pulse 88   Temp 98.4 F (36.9 C) (Oral)   Resp 16   Ht 4' 11 (1.499 m)   Wt 90.4 kg   LMP 02/14/2023 Comment: by early u/s  SpO2 94%   BMI 40.23 kg/m  EFM:120bpm/Moderate variability/ 15x15 accels/ None decels CAT: 1 Toco: irregular   CVE: Dilation: 4.5 Effacement (%): 60 Station: -3 Presentation: Vertex Exam by:: KYM Eagles, RNC   A&P: 28 y.o. H2E6966 [redacted]w[redacted]d  here for  as above  #Labor: Progressing well. Currently on 10 of pit, AROM'd with clear fluid. Will continue to titrate #Pain: Epidural #FWB: CAT 1 #GBS positive  #vag deliv with shoulder dystocia Vag with shoulder > C/S > vbac EFW 78% last week. This baby is noted to be 3069g on her US  last week, pelvis proven to 3311g.   Chiquita Clover, MD PGY-2 [facility] Family Medicine Resident [facility], Center for [facility] 11/17/23  5:05 PM

## 2023-11-17 NOTE — Patient Instructions (Signed)
Nicolemarie, thank you for choosing our office today! We appreciate the opportunity to meet your healthcare needs. You may receive a short survey by mail, e-mail, or through MyChart. If you are happy with your care we would appreciate if you could take just a few minutes to complete the survey questions. We read all of your comments and take your feedback very seriously. Thank you again for choosing our office.  [facility] [place] ([address] [place], [place] [address]) Entrance C, located off of [address] Free 24/7 valet parking   CLASSES: Go to [email or web address] to register for classes (childbirth, breastfeeding, waterbirth, infant CPR, daddy bootcamp, etc.)  Call the office ([phone]) or go to [facility] if: You begin to have strong, frequent contractions Your water breaks.  Sometimes it is a big gush of fluid, sometimes it is just a trickle that keeps getting your panties wet or running down your legs You have vaginal bleeding.  It is normal to have a small amount of spotting if your cervix was checked.  You don't feel your baby moving like normal.  If you don't, get you something to eat and drink and lay down and focus on feeling your baby move.   If your baby is still not moving like normal, you should call the office or go to [facility].  Call the office ([phone]) or go to [facility] for these signs of pre-eclampsia: Severe headache that does not go away with Tylenol Visual changes- seeing spots, double, blurred vision Pain under your right breast or upper abdomen that does not go away with Tums or heartburn medicine Nausea and/or vomiting Severe swelling in your hands, feet, and face   [place] Pediatricians/Family Doctors [facility] ([facility]): [address], [phone]           [facility]: [address], [phone]                 [facility] ([facility]): [address], [phone] (call to ask if accepting patients) [facility]: [address], [place], [phone]    [place] Pediatricians/Family Doctors [facility] ([facility]): [address], [phone] [facility]: [address], [phone] [facility] [place]: [address], [phone]  [place] Family [facility]  [facility] ([facility]): [phone] [facility]: [address], [phone]   [place] Family Doctors [facility]: [address], [phone]  [place] [facility]  [facility]: [address], [phone]  Home Blood Pressure Monitoring for Patients   Your provider has recommended that you check your blood pressure (BP) at least once a week at home. If you do not have a blood pressure cuff at home, one will be provided for you. Contact your provider if you have not received your monitor within 1 week.   Helpful Tips for Accurate Home Blood Pressure Checks  Don't smoke, exercise, or drink caffeine 30 minutes before checking your BP Use the restroom before checking your BP (a full bladder can raise your pressure) Relax in a comfortable upright chair Feet on the ground Left arm resting comfortably on a flat surface at the level of your heart Legs uncrossed Back supported Sit quietly and don't talk Place the cuff on your bare arm Adjust snuggly, so that only two fingertips   can fit between your skin and the top of the cuff Check 2 readings separated by at least one minute Keep a log of your BP readings For a visual, please reference this diagram: http://ccnc.care/bpdiagram  Provider Name: [facility]     Phone: [phone]  Zone 1: ALL CLEAR  Continue to monitor your symptoms:  BP reading is less than 140 (top number) or less than 90 (bottom number)  No right  upper stomach pain No headaches or seeing spots No feeling nauseated or throwing up No swelling in face and hands  Zone 2: CAUTION Call your doctor's office for any of the following:  BP reading is greater than 140 (top number) or greater than 90 (bottom number)  Stomach pain under your ribs in the middle or right side Headaches or seeing spots Feeling nauseated or throwing up Swelling in face and hands  Zone 3: EMERGENCY  Seek immediate medical care if you have any of the following:  BP reading is greater than160 (top number) or greater than 110 (bottom number) Severe headaches not improving with Tylenol Serious difficulty catching your breath Any worsening symptoms from Zone 2   Braxton Hicks Contractions Contractions of the uterus can occur throughout pregnancy, but they are not always a sign that you are in labor. You may have practice contractions called Braxton Hicks contractions. These false labor contractions are sometimes confused with true labor. What are [place] contractions? Braxton Hicks contractions are tightening movements that occur in the muscles of the uterus before labor. Unlike true labor contractions, these contractions do not result in opening (dilation) and thinning of the cervix. Toward the end of pregnancy (32-34 weeks), Braxton Hicks contractions can happen more often and may become stronger. These contractions are sometimes difficult to tell apart from true labor because they can be very uncomfortable. You should not feel embarrassed if you go to the hospital with false labor. Sometimes, the only way to tell if you are in true labor is for your health care provider to look for changes in the cervix. The health care provider will do a physical exam and may monitor your contractions. If you are not in true labor, the exam should show that your cervix is not dilating and your water has not broken. If there are no other health problems associated with your  pregnancy, it is completely safe for you to be sent home with false labor. You may continue to have Braxton Hicks contractions until you go into true labor. How to tell the difference between true labor and false labor True labor Contractions last 30-70 seconds. Contractions become very regular. Discomfort is usually felt in the top of the uterus, and it spreads to the lower abdomen and low back. Contractions do not go away with walking. Contractions usually become more intense and increase in frequency. The cervix dilates and gets thinner. False labor Contractions are usually shorter and not as strong as true labor contractions. Contractions are usually irregular. Contractions are often felt in the front of the lower abdomen and in the groin. Contractions may go away when you walk around or change positions while lying down. Contractions get weaker and are shorter-lasting as time goes on. The cervix usually does not dilate or become thin. Follow these instructions at home:  Take over-the-counter and prescription medicines only as told by your health care provider. Keep up with your usual exercises and follow other instructions from your health care provider. Eat and drink lightly if you think  you are going into labor. If Braxton Hicks contractions are making you uncomfortable: Change your position from lying down or resting to walking, or change from walking to resting. Sit and rest in a tub of warm water. Drink enough fluid to keep your urine pale yellow. Dehydration may cause these contractions. Do slow and deep breathing several times an hour. Keep all follow-up prenatal visits as told by your health care provider. This is important. Contact a health care provider if: You have a fever. You have continuous pain in your abdomen. Get help right away if: Your contractions become stronger, more regular, and closer together. You have fluid leaking or gushing from your vagina. You pass  blood-tinged mucus (bloody show). You have bleeding from your vagina. You have low back pain that you never had before. You feel your baby's head pushing down and causing pelvic pressure. Your baby is not moving inside you as much as it used to. Summary Contractions that occur before labor are called Braxton Hicks contractions, false labor, or practice contractions. Braxton Hicks contractions are usually shorter, weaker, farther apart, and less regular than true labor contractions. True labor contractions usually become progressively stronger and regular, and they become more frequent. Manage discomfort from [facility] contractions by changing position, resting in a warm bath, drinking plenty of water, or practicing deep breathing. This information is not intended to replace advice given to you by your health care provider. Make sure you discuss any questions you have with your health care provider. Document Revised: 04/08/2017 Document Reviewed: 09/09/2016 Elsevier Patient Education  [place].

## 2023-11-17 NOTE — Progress Notes (Signed)
 LOW-RISK PREGNANCY VISIT Patient name: Stacey Cook MRN [id number]  Date of birth: 24-Mar-1996 Chief Complaint:   Routine Prenatal Visit (Cervical check)  History of Present Illness:   Stacey Cook is a 28 y.o. H2E6966 female at [redacted]w[redacted]d with an Estimated Date of Delivery: 12/08/23 being seen today for ongoing management of a low-risk pregnancy.   Today she reports continued itching feet/belly, h/o ICP, last labs checked at beginning of June and were wnl . Contractions: Irregular.  .  Movement: Present. denies leaking of fluid.     09/14/2023    9:12 AM 05/26/2023   10:38 AM 11/26/2022   10:11 AM 05/15/2020   10:30 AM 11/28/2019    9:56 AM  Depression screen PHQ 2/9  Decreased Interest 1 1 1 2 2   Down, Depressed, Hopeless 1 0 2 3 1   PHQ - 2 Score 2 1 3 5 3   Altered sleeping 2 1 3 1 3   Tired, decreased energy 2 2 3 1 3   Change in appetite 3 3 3 2 1   Feeling bad or failure about yourself  2 0 2 3 1   Trouble concentrating 1 1 3 1 2   Moving slowly or fidgety/restless 3 1 1 1  0  Suicidal thoughts 0 0 0 3 0  PHQ-9 Score 15 9 18 17 13   Difficult doing work/chores     Somewhat difficult        09/14/2023    9:12 AM 05/26/2023   10:38 AM 11/26/2022   10:12 AM 05/15/2020   10:33 AM  GAD 7 : Generalized Anxiety Score  Nervous, Anxious, on Edge 0 0 2 1  Control/stop worrying 0 0 3 1  Worry too much - different things 1 0 3 3  Trouble relaxing 2 2 3 1   Restless 2 2 0 0  Easily annoyed or irritable 1 3 3 3   Afraid - awful might happen 0 0 0 3  Total GAD 7 Score 6 7 14 12       Review of Systems:   Pertinent items are noted in HPI Denies abnormal vaginal discharge w/ itching/odor/irritation, headaches, visual changes, shortness of breath, chest pain, abdominal pain, severe nausea/vomiting, or problems with urination or bowel movements unless otherwise stated above. Pertinent History Reviewed:  Reviewed past medical,surgical, social, obstetrical and family history.  Reviewed problem list,  medications and allergies. Physical Assessment:   Vitals:   11/17/23 0837  BP: 110/73  Pulse: 92  Weight: 199 lb 3.2 oz (90.4 kg)  Body mass index is 40.23 kg/m.        Physical Examination:   General appearance: Well appearing, and in no distress  Mental status: Alert, oriented to person, place, and time  Skin: Warm & dry  Cardiovascular: Normal heart rate noted  Respiratory: Normal respiratory effort, no distress  Abdomen: Soft, gravid, nontender  Pelvic: Cervical exam performed  Dilation: 3 Effacement (%): 50 Station: -3  Extremities: Edema: Trace  Fetal Status: Fetal Heart Rate (bpm): 142 Fundal Height: 37 cm Movement: Present Presentation: Vertex  Chaperone: Peggy Dones No results found for this or any previous visit (from the past 24 hours).  Assessment & Plan:  1) Low-risk pregnancy [id number] at [redacted]w[redacted]d with an Estimated Date of Delivery: 12/08/23   2) H/O ICP w/ continued itching, per Dr. Jayne direct admit to L&D for IOL, notified L&D charge and [place], CNM  3) Vag w/ shoulder dystocia>C/S>VBAC- wants TOLAC, this baby EFW 78% last week   Meds: No orders  of the defined types were placed in this encounter.  Labs/procedures today: SVE  Follow-up: Return for will schedule pp visit after delivery.  No future appointments.   No orders of the defined types were placed in this encounter.  Suzen JONELLE Fetters CNM, [facility] 11/17/2023 9:31 AM

## 2023-11-17 NOTE — Anesthesia Procedure Notes (Addendum)
 Epidural Patient location during procedure: OB Start time: 11/17/2023 1:30 PM End time: 11/17/2023 1:45 PM  Staffing Anesthesiologist: Boone Fess, MD Performed: anesthesiologist   Preanesthetic Checklist Completed: patient identified, IV checked, site marked, risks and benefits discussed, surgical consent, monitors and equipment checked, pre-op evaluation and timeout performed  Epidural Patient position: sitting Prep: ChloraPrep Patient monitoring: heart rate, continuous pulse ox and blood pressure Approach: midline Location: L3-L4 Injection technique: LOR saline  Needle:  Needle type: Tuohy  Needle gauge: 17 G Needle length: 9 cm Needle insertion depth: 6 cm Catheter type: closed end flexible Catheter size: 19 Gauge Catheter at skin depth: 11 cm Test dose: negative and 1.5% lidocaine  with Epi 1:200 K  Assessment Sensory level: T10 Events: blood not aspirated, no cerebrospinal fluid, injection not painful, no injection resistance, no paresthesia and negative IV test  Additional Notes First / one attempt Pt. Evaluated and documentation done after procedure finished. Patient identified. Risks/Benefits/Options discussed with patient including but not limited to bleeding, infection, nerve damage, paralysis, failed block, incomplete pain control, headache, blood pressure changes, nausea, vomiting, reactions to medication both or allergic, itching and postpartum back pain. Confirmed with bedside nurse the patient's most recent platelet count. Confirmed with patient that they are not currently taking any anticoagulation, have any bleeding history or any family history of bleeding disorders. Patient expressed understanding and wished to proceed. All questions were answered. Sterile technique was used throughout the entire procedure. Please see nursing notes for vital signs. Test dose was given through epidural catheter and negative prior to continuing to dose epidural or start infusion.  Warning signs of high block given to the patient including shortness of breath, tingling/numbness in hands, complete motor block, or any concerning symptoms with instructions to call for help. Patient was given instructions on fall risk and not to get out of bed. All questions and concerns addressed with instructions to call with any issues or inadequate analgesia.     Patient tolerated the insertion well without immediate complications.  Reason for block: procedure for painReason for block:procedure for pain

## 2023-11-17 NOTE — H&P (Addendum)
 Attestation of Supervision of Student:  I confirm that I have verified the information documented in the medical resident's note and that I have also personally supervised the history, physical exam and all medical decision making activities.  I have verified that all services and findings are accurately documented in this student's note; and I agree with management and plan as outlined in the documentation. I have also made any necessary editorial changes.  Camie DELENA Rote, CNM [facility] [employer], [facility] 11/17/2023 6:02 PM   OBSTETRIC ADMISSION HISTORY AND PHYSICAL  Stacey Cook is a 28 y.o. female [phone] with IUP at [redacted]w[redacted]d by early US  presenting for IOL for presumed cholestasis. She reports +FMs, No LOF, no VB, no blurry vision, headaches or peripheral edema, and RUQ pain.  She plans on breast feeding, but she has had problems with her supply in past pregnancies. She is undecided on birthcontrol at this time, knows she wants something though She received her prenatal care at [facility]   Dating: By early US  --->  Estimated Date of Delivery: 12/08/23  Sono:    @[redacted]w[redacted]d , CWD, normal anatomy, cephalic presentation, 3069g, 21% EFW   Prenatal History/Complications: smoker, history of c/s, history of successful VBAC, history of shoulder dystocia,history of of cholestasis of pregnancy  Past Medical History: Past Medical History:  Diagnosis Date   Anemia    Anginal pain (HCC)    Anxiety    Arthritis    knees   Asthma    Cervicalgia    Cholestasis during pregnancy    Chronic abdominal pain    Complication of anesthesia    light headed   Constipation    Ear mass    Gestational diabetes    pt states not been checking sugars regularly at home; nor has she been taking her Metformin    Headache    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   HSV infection    Low iron     Mental disorder    Ovarian cyst    Pancreatitis 01/12/2018   PID (acute  pelvic inflammatory disease) 07/15/2021   PONV (postoperative nausea and vomiting)    Suicidal intent    UTI (lower urinary tract infection) 05/2014    Past Surgical History: Past Surgical History:  Procedure Laterality Date   CESAREAN SECTION N/A 01/03/2019   Procedure: CESAREAN SECTION;  Surgeon: Jayne Vonn DEL, MD;  Location: [facility] LD ORS;  Service: Obstetrics;  Laterality: N/A;   CHOLECYSTECTOMY N/A 01/16/2018   Procedure: LAPAROSCOPIC CHOLECYSTECTOMY;  Surgeon: Mavis Anes, MD;  Location: [facility] ORS;  Service: General;  Laterality: N/A;  per Dr. Mavis, pt will most likely go home and he will tell her to arrive at 10:45 - already has labs   CHOLESTEATOMA EXCISION     COCHLEAR IMPLANT Right    COCHLEAR IMPLANT REMOVAL Right 05/2020   DILATION AND EVACUATION N/A 09/11/2016   Procedure: DILATATION AND CURRATAGE  2ND TRIMESTER;  Surgeon: Edsel Norleen GAILS, MD;  Location: [facility] ORS;  Service: Gynecology;  Laterality: N/A;   IMPLANTATION BONE ANCHORED HEARING AID Right 04/2013   INNER EAR SURGERY Right 03/31/2018   MIDDLE EAR SURGERY     28 surgeries for cholesteatoma   TYMPANOPLASTY Left    TYMPANOSTOMY      Obstetrical History: OB History     Gravida  7   Para  3   Term  3   Preterm  0   AB  3  Living  3      SAB  3   IAB  0   Ectopic  0   Multiple  0   Live Births  3           Social History Social History   Socioeconomic History   Marital status: Married    Spouse name: Fonda Novak   Number of children: 3   Years of education: Not on file   Highest education level: Not on file  Occupational History   Not on file  Tobacco Use   Smoking status: Every Day    Current packs/day: 0.25    Average packs/day: 0.3 packs/day for 3.4 years (1.1 ttl pk-yrs)    Types: Cigarettes    Start date: 05/2020    Last attempt to quit: 05/2021   Smokeless tobacco: Never   Tobacco comments:    3-4 cigs a day   Vaping Use   Vaping status: Former   Substances:  Flavoring  Substance and Sexual Activity   Alcohol use: Not Currently    Comment: occ   Drug use: No   Sexual activity: Yes    Birth control/protection: None  Other Topics Concern   Not on file  Social History Narrative   [employer]   Is smoker herself            Social Drivers of Corporate investment banker Strain: Medium Risk (05/26/2023)   Overall Financial Resource Strain (CARDIA)    Difficulty of Paying Living Expenses: Somewhat hard  Food Insecurity: No Food Insecurity (11/17/2023)   Hunger Vital Sign    Worried About Running Out of Food in the Last Year: Never true    Ran Out of Food in the Last Year: Never true  Transportation Needs: Unmet Transportation Needs (11/17/2023)   PRAPARE - Administrator, Civil Service (Medical): Yes    Lack of Transportation (Non-Medical): No  Physical Activity: Insufficiently Active (05/26/2023)   Exercise Vital Sign    Days of Exercise per Week: 7 days    Minutes of Exercise per Session: 10 min  Stress: No Stress Concern Present (05/26/2023)   [employer] of Occupational Health - Occupational Stress Questionnaire    Feeling of Stress : Only a little  Social Connections: Moderately Isolated (11/17/2023)   Social Connection and Isolation Panel    Frequency of Communication with Friends and Family: More than three times a week    Frequency of Social Gatherings with Friends and Family: Never    Attends Religious Services: Never    Database administrator or Organizations: No    Attends Engineer, structural: Never    Marital Status: Married    Family History: Family History  Problem Relation Age of Onset   Hypertension Maternal Grandmother    Diabetes Maternal Grandmother    Stroke Maternal Grandmother    Seizures Maternal Grandmother    Asthma Maternal Grandmother    Hyperlipidemia Maternal Grandmother    Thyroid disease Maternal Grandmother    Cancer Maternal Grandmother    Cholelithiasis  Mother    Kidney disease Mother        stones   Depression Mother    Asthma Brother    Scoliosis Daughter    Autism Son    Seizures Maternal Uncle    Cancer Other        breast- great aunt   Celiac disease Neg Hx     Allergies: Allergies  Allergen Reactions  Cat Dander Other (See Comments)    Puffy, watery eyes; sneezing   Dilaudid  [Hydromorphone ] Other (See Comments)    Vomiting, rib pain, dizzy, flushed, sweating   Keflex  [Cephalexin ] Other (See Comments)    Reaction:  Elevated liver enzymes    Other Other (See Comments)    Pt is allergic to surgical glue swelling;  Cats-congestion, sneezing, eyes watery, itching all over Reaction:  Infection    Adhesive [Tape] Rash    Medications Prior to Admission  Medication Sig Dispense Refill Last Dose/Taking   acetaminophen  (TYLENOL ) 500 MG tablet Take 2 tablets (1,000 mg total) by mouth every 6 (six) hours as needed (Take this medication every 6 hours for 24 hours and then use as needed).      albuterol  (PROVENTIL ) (2.5 MG/3ML) 0.083% nebulizer solution Take 2.5 mg by nebulization See admin instructions. 1 vial via nebulizer every 6 to 8 hours as needed for shortness of breath      albuterol  (VENTOLIN  HFA) 108 (90 Base) MCG/ACT inhaler Inhale 1-2 puffs into the lungs every 6 (six) hours as needed for wheezing or shortness of breath. 18 g 0    Blood Pressure Monitor MISC For regular home bp monitoring during pregnancy 1 each 0    Doxylamine -Pyridoxine  (DICLEGIS ) 10-10 MG TBEC 2 tabs q hs, if sx persist add 1 tab q am on day 3, if sx persist add 1 tab q afternoon on day 4 100 tablet 6    hydrocortisone  cream 1 % Apply 1 Application topically 2 (two) times daily.      hydrOXYzine  (ATARAX ) 25 MG tablet Take 1 tablet (25 mg total) by mouth every 6 (six) hours as needed for itching. (Patient not taking: Reported on 11/17/2023) 30 tablet 2    pantoprazole  (PROTONIX ) 20 MG tablet Take 1 tablet (20 mg total) by mouth daily. (Patient not taking:  Reported on 11/17/2023) 30 tablet 1    prenatal vitamin w/FE, FA (PRENATAL 1 + 1) 27-1 MG TABS tablet Take 1 tablet by mouth daily at 12 noon. 30 tablet 12    terconazole  (TERAZOL 7 ) 0.4 % vaginal cream Place 1 applicator vaginally at bedtime. (Patient not taking: Reported on 11/17/2023) 45 g 0      Review of Systems   All systems reviewed and negative except as stated in HPI  Last menstrual period 02/14/2023. General appearance: alert Lungs: no respiratory distress Heart: well perfused Abdomen: soft, non-tender; bowel sounds normal  Extremities: Homans sign is negative, no sign of DVT Presentation: cephalic Fetal monitoringBaseline: 130 bpm, Variability: Good {> 6 bpm), Accelerations: Reactive, and Decelerations: Absent Uterine activity: q 10 mins, irreg     Prenatal labs: ABO, Rh: A/Positive/-- (01/16 1129) Antibody: Negative (05/07 0841) Rubella: 1.24 (01/16 1129) RPR: Non Reactive (05/07 0841)  HBsAg: Negative (01/16 1129)  HIV: Non Reactive (05/07 0841)  GBS: --/Positive (07/02 1330)    Lab Results  Component Value Date   GBS Positive (A) 02/04/2020   GTT nml   Genetic screening  nml Anatomy US  nml  Immunization History  Administered Date(s) Administered   DTaP 04/24/1998, 06/26/1998, 01/25/1999, 05/26/1999, 12/05/2002   H1N1 04/10/2008   HIB (PRP-OMP) 04/24/1998, 06/26/1998, 01/25/1999, 05/26/1999   HPV Quadrivalent 02/17/2012, 02/22/2013, 03/20/2014   Hepatitis A, Ped/Adol-2 Dose 02/22/2013, 02/02/2016   Hepatitis B 08/25/1998, 11/24/1998, 03/10/1999   IPV 04/24/1998, 06/26/1998, 03/10/1999, 12/05/2002   Influenza Nasal 02/21/2009, 02/08/2011, 02/17/2012   Influenza Whole 04/23/2005, 03/09/2006, 02/27/2007, 02/03/2008   Influenza, Seasonal, Injecte, Preservative Fre 02/22/2013, 05/26/2023  Influenza,inj,Quad PF,6+ Mos 02/06/2014, 03/21/2015, 02/02/2016, 02/23/2017, 06/06/2018   Influenza-Unspecified 01/14/2010   MMR 05/26/1999, 05/25/2001   Meningococcal  Conjugate 02/02/2016   Meningococcal polysaccharide vaccine (MPSV4) 12/27/2008   Pneumococcal Conjugate-13 12/25/1998, 03/10/1999, 05/05/1999   Tdap 12/27/2008, 10/11/2017, 10/13/2018, 11/28/2019   Varicella 03/10/1999, 12/27/2008    Prenatal Transfer Tool  Maternal Diabetes: No Genetic Screening: Normal Maternal Ultrasounds/Referrals: Normal Fetal Ultrasounds or other Referrals:  None Maternal Substance Abuse:  Yes:  Type: Smoker Significant Maternal Medications:  None Significant Maternal Lab Results: Group B Strep positive Number of Prenatal Visits:greater than 3 verified prenatal visits Maternal Vaccinations:Flu, declined tdap Other Comments:  None         NURSING  PROVIDER  Office Location [facility] Dating by U/S at 5 wks  [facility] Model Traditional Anatomy U/S Normal female 'Lucas'  Initiated care at  [facility]                 Language  English               LAB RESULTS   Support Person   Genetics NIPS: LR female AFP:       NT/IT (FT only) neg      Carrier Screen Horizon: 08/23/19 neg  Rhogam  A/Positive/-- (01/16 1129) A1C/GTT Early HgbA1C: 4.9 Third trimester 2 hr GTT: nl  Flu Vaccine 05/26/23      TDaP Vaccine  declined Blood Type A/Positive/-- (01/16 1129)  RSV Vaccine   Antibody Negative (01/16 1129)  COVID Vaccine   Rubella 1.24 (01/16 1129)  Feeding Plan breast RPR Non Reactive (01/16 1129)  Contraception IUD@ ppv OR NuvaRing HBsAg Negative (01/16 1129)  Circumcision yes HIV Non Reactive (01/16 1129)  Pediatrician  [facility] HCVAb Non Reactive (01/16 1129)  Prenatal Classes discussed      BTL Consent   Pap       Diagnosis  Date Value Ref Range Status  11/26/2022     Final    - Negative for intraepithelial lesion or malignancy (NILM)    BTL Pre-payment   GC/CT Initial:  -/- 36wks:    VBAC Consent   GBS   For PCN allergy, check sensitivities   BRx Optimized? [ ]  yes   [ ]  no      DME Rx [x ] BP cuff [ ]  Weight Scale Waterbirth  [ ]  Class [ ]  Consent [ ]  CNM visit   PHQ9 & GAD7 [ x ] new OB [  ] 28 weeks  [  ] 36 weeks Induction  [ ]  Orders Entered [ ] Foley Y/N      No results found for this or any previous visit (from the past 24 hours).  Patient Active Problem List   Diagnosis Date Noted   Labor and delivery, indication for care 11/17/2023   Encounter for supervision of normal pregnancy, antepartum 05/24/2023   Status post cesarean section 01/03/2019   Gastroesophageal reflux disease    History of shoulder dystocia in prior pregnancy 12/02/2017   History of postpartum hemorrhage 12/02/2017   History of cholestasis during pregnancy 09/21/2017   History of gestational diabetes 08/24/2017   Chest pain of uncertain etiology 08/05/2017   Depression with anxiety and suicidal ideations 06/13/2017   Conductive hearing loss, unilat, unrestrict hearing contralateral side 12/10/2015   Binge-eating disorder, in partial remission, moderate 03/21/2015   Asthma, mild intermittent 02/18/2015   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 12/20/2014   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 [phone] at [redacted]w[redacted]d here for  TOLAC IOL for presumed cholestasis of pregnancy with worsening itching and a history of cholestasis in prior preg.   smoker, history of c/s, history of successful VBAC, history of shoulder dystocia,history of cholestasis of pregnancy  #Labor:TOLAC will induce with pitocin  and AROM #Pain: Epidural upon request #FWB: Cat 1 #GBS status:  Positive PCN #Feeding: Breastmilk  and Formula #Reproductive Life planning: Undecided #Circ:  yes   #Presumed cholestasis #History of cholestasis Sent here from the office for a direct admission  #vag  deliv with shoulder dystocia Vag with shoulder > C/S > vbac EFW 78% last week. This baby is noted to be 3069g on her US  last week, in the shoulder dystocia delivery the baby was 3311g.    Chiquita Clover, MD  11/17/2023, 12:37 PM

## 2023-11-17 NOTE — Discharge Summary (Signed)
 Postpartum Discharge Summary  Date of Service updated     Patient Name: Stacey Cook DOB: 10-29-1995 MRN: [id number]  Date of admission: 11/17/2023 Delivery date:11/17/2023 Delivering provider: NEWTON MERING Date of discharge: 11/19/2023  Admitting diagnosis: Labor and delivery, indication for care [O75.9] Intrauterine pregnancy: [redacted]w[redacted]d     Secondary diagnosis:  Principal Problem:   Labor and delivery, indication for care Active Problems:   VBAC (vaginal birth after Cesarean)   Intrahepatic cholestasis of pregnancy in third trimester  Additional problems: None    Discharge diagnosis: VBAC                                              Post partum procedures:None Augmentation: AROM and Pitocin  Complications: None  Hospital course: Induction of Labor With Vaginal Delivery   28 y.o. yo [phone] at [redacted]w[redacted]d was admitted to the hospital 11/17/2023 for induction of labor.  Indication for induction: ICP.  Patient had an labor course complicated by none. Membrane Rupture Time/Date: 5:01 PM,11/17/2023  Delivery Method:Vaginal, Spontaneous Operative Delivery:N/A Episiotomy: None Lacerations:  None Details of delivery can be found in separate delivery note.  Patient had a postpartum course complicated by none. Patient is discharged home 11/19/23.  Newborn Data: Birth date:11/17/2023 Birth time:8:37 PM Gender:Female Living status:Living Apgars:9 ,9  Weight:2950 g  Magnesium  Sulfate received: No BMZ received: No Rhophylac:N/A MMR:N/A T-DaP:Declined Flu: N/A RSV Vaccine received: No Transfusion:No  Immunizations received: Immunization History  Administered Date(s) Administered   DTaP 04/24/1998, 06/26/1998, 01/25/1999, 05/26/1999, 12/05/2002   H1N1 04/10/2008   HIB (PRP-OMP) 04/24/1998, 06/26/1998, 01/25/1999, 05/26/1999   HPV Quadrivalent 02/17/2012, 02/22/2013, 03/20/2014   Hepatitis A, Ped/Adol-2 Dose 02/22/2013, 02/02/2016   Hepatitis B 08/25/1998, 11/24/1998,  03/10/1999   IPV 04/24/1998, 06/26/1998, 03/10/1999, 12/05/2002   Influenza Nasal 02/21/2009, 02/08/2011, 02/17/2012   Influenza Whole 04/23/2005, 03/09/2006, 02/27/2007, 02/03/2008   Influenza, Seasonal, Injecte, Preservative Fre 02/22/2013, 05/26/2023   Influenza,inj,Quad PF,6+ Mos 02/06/2014, 03/21/2015, 02/02/2016, 02/23/2017, 06/06/2018   Influenza-Unspecified 01/14/2010   MMR 05/26/1999, 05/25/2001   Meningococcal Conjugate 02/02/2016   Meningococcal polysaccharide vaccine (MPSV4) 12/27/2008   Pneumococcal Conjugate-13 12/25/1998, 03/10/1999, 05/05/1999   Tdap 12/27/2008, 10/11/2017, 10/13/2018, 11/28/2019   Varicella 03/10/1999, 12/27/2008    Physical exam  Vitals:   11/18/23 0900 11/18/23 1242 11/18/23 2117 11/19/23 0609  BP: (!) 114/56 105/65 (!) 117/55 111/61  Pulse: 82 75 78 79  Resp: 17 17 16 16   Temp: 98 F (36.7 C) 98.1 F (36.7 C) 97.9 F (36.6 C) 97.8 F (36.6 C)  TempSrc: Oral Oral Oral Oral  SpO2: 98% 96% 97% 99%  Weight:      Height:       General: alert, cooperative, and no distress Lochia: appropriate Uterine Fundus: firm Incision: N/A DVT Evaluation: No evidence of DVT seen on physical exam. Labs: Lab Results  Component Value Date   WBC 13.4 (H) 11/17/2023   HGB 11.9 (L) 11/17/2023   HCT 35.3 (L) 11/17/2023   MCV 88.7 11/17/2023   PLT 181 11/17/2023      Latest Ref Rng & Units 11/17/2023   12:30 PM  CMP  Glucose 70 - 99 mg/dL 81   BUN 6 - 20 mg/dL 10   Creatinine 9.55 - 1.00 mg/dL 9.52   Sodium 864 - 854 mmol/L 133   Potassium 3.5 - 5.1 mmol/L 4.3   Chloride 98 - 111  mmol/L 103   CO2 22 - 32 mmol/L 19   Calcium 8.9 - 10.3 mg/dL 9.4   Total Protein 6.5 - 8.1 g/dL 6.5   Total Bilirubin 0.0 - 1.2 mg/dL 0.6   Alkaline Phos 38 - 126 U/L 183   AST 15 - 41 U/L 28   ALT 0 - 44 U/L 23    Edinburgh Score:    11/18/2023   12:32 AM  Edinburgh Postnatal Depression Scale Screening Tool  I have been able to laugh and see the funny side of  things. --   No data recorded  After visit meds:  Allergies as of 11/19/2023       Reactions   Cat Dander Other (See Comments)   Puffy, watery eyes; sneezing   Dilaudid  [hydromorphone ] Other (See Comments)   Vomiting, rib pain, dizzy, flushed, sweating   Keflex  [cephalexin ] Other (See Comments)   Reaction:  Elevated liver enzymes    Other Other (See Comments)   Pt is allergic to surgical glue swelling;  Cats-congestion, sneezing, eyes watery, itching all over Reaction:  Infection    Adhesive [tape] Rash        Medication List     STOP taking these medications    Doxylamine -Pyridoxine  10-10 MG Tbec Commonly known as: Diclegis    hydrOXYzine  25 MG tablet Commonly known as: ATARAX    pantoprazole  20 MG tablet Commonly known as: Protonix    terconazole  0.4 % vaginal cream Commonly known as: TERAZOL 7        TAKE these medications    acetaminophen  500 MG tablet Commonly known as: TYLENOL  Take 2 tablets (1,000 mg total) by mouth every 8 (eight) hours as needed (for pain scale < 4). What changed:  when to take this reasons to take this   albuterol  (2.5 MG/3ML) 0.083% nebulizer solution Commonly known as: PROVENTIL  Take 2.5 mg by nebulization See admin instructions. 1 vial via nebulizer every 6 to 8 hours as needed for shortness of breath   albuterol  108 (90 Base) MCG/ACT inhaler Commonly known as: VENTOLIN  HFA Inhale 1-2 puffs into the lungs every 6 (six) hours as needed for wheezing or shortness of breath.   Blood Pressure Monitor Misc For regular home bp monitoring during pregnancy   hydrocortisone  cream 1 % Apply 1 Application topically 2 (two) times daily.   ibuprofen  800 MG tablet Commonly known as: ADVIL  Take 1 tablet (800 mg total) by mouth every 8 (eight) hours as needed for mild pain (pain score 1-3) or moderate pain (pain score 4-6).   prenatal vitamin w/FE, FA 27-1 MG Tabs tablet Take 1 tablet by mouth daily at 12 noon.   senna 8.6 MG Tabs  tablet Commonly known as: SENOKOT Take 2 tablets (17.2 mg total) by mouth at bedtime as needed for mild constipation.         Discharge home in stable condition Infant Feeding: Breast Infant Disposition:home with mother Discharge instruction: per After Visit Summary and Postpartum booklet. Activity: Advance as tolerated. Pelvic rest for 6 weeks.  Diet: routine diet  Future Appointments: Future Appointments  Date Time Provider Department Center  12/22/2023 10:30 AM Kizzie Suzen SAUNDERS, CNM [facility]   Follow up Visit:   Please schedule this patient for a In person postpartum visit in 4 weeks with the following provider: Any provider. Additional Postpartum F/U:  Low risk pregnancy complicated by: ICP Delivery mode:  Vaginal, Spontaneous Anticipated Birth Control:  OP IUD   11/19/2023 Almarie CHRISTELLA Moats, MD

## 2023-11-17 NOTE — Anesthesia Preprocedure Evaluation (Signed)
 Anesthesia Evaluation  Patient identified by MRN, date of birth, ID band Patient awake    Reviewed: Allergy & Precautions, NPO status , Patient's Chart, lab work & pertinent test results  History of Anesthesia Complications (+) history of anesthetic complications  Airway Mallampati: III  TM Distance: >3 FB Neck ROM: Full    Dental no notable dental hx. (+) Teeth Intact   Pulmonary asthma , neg sleep apnea, neg COPD, Current SmokerPatient did not abstain from smoking.   Pulmonary exam normal breath sounds clear to auscultation       Cardiovascular Exercise Tolerance: Good METS(-) hypertension(-) CAD and (-) Past MI negative cardio ROS (-) dysrhythmias  Rhythm:Regular Rate:Normal - Systolic murmurs    Neuro/Psych  Headaches PSYCHIATRIC DISORDERS Anxiety Depression       GI/Hepatic ,GERD  ,,(+)     (-) substance abuse    Endo/Other  diabetes  Class 3 obesity  Renal/GU negative Renal ROS     Musculoskeletal   Abdominal  (+) + obese  Peds  Hematology  (+) Blood dyscrasia, anemia Denies blood thinner use or bleeding disorders.    Anesthesia Other Findings Past Medical History: No date: Anemia No date: Anginal pain (HCC) No date: Anxiety No date: Arthritis     Comment:  knees No date: Asthma No date: Cervicalgia No date: Cholestasis during pregnancy No date: Chronic abdominal pain No date: Complication of anesthesia     Comment:  light headed No date: Constipation No date: Ear mass No date: Gestational diabetes     Comment:  pt states not been checking sugars regularly at home;               nor has she been taking her Metformin  No date: Headache No date: Hearing loss in right ear     Comment:  has cochlear hearing aid No date: HSV infection No date: Low iron  No date: Mental disorder No date: Ovarian cyst 01/12/2018: Pancreatitis 07/15/2021: PID (acute pelvic inflammatory disease) No date: PONV  (postoperative nausea and vomiting) No date: Suicidal intent 05/2014: UTI (lower urinary tract infection)  Reproductive/Obstetrics (+) Pregnancy                              Anesthesia Physical Anesthesia Plan  ASA: 3  Anesthesia Plan: Epidural   Post-op Pain Management:    Induction:   PONV Risk Score and Plan: 3 and Treatment may vary due to age or medical condition and Ondansetron   Airway Management Planned: Natural Airway  Additional Equipment:   Intra-op Plan:   Post-operative Plan:   Informed Consent: I have reviewed the patients History and Physical, chart, labs and discussed the procedure including the risks, benefits and alternatives for the proposed anesthesia with the patient or authorized representative who has indicated his/her understanding and acceptance.       Plan Discussed with: Surgeon  Anesthesia Plan Comments: (Discussed R/B/A of neuraxial anesthesia technique with patient: - rare risks of spinal/epidural hematoma, nerve damage, infection - Risk of PDPH - Risk of itching - Risk of nausea and vomiting - Risk of poor block necessitating replacement of epidural. Patient informed about increased incidence of above perioperative risk due to high BMI. Patient understands.   - Risk of allergic reactions. Patient voiced understanding.)        Anesthesia Quick Evaluation

## 2023-11-18 ENCOUNTER — Encounter (HOSPITAL_COMMUNITY): Payer: Self-pay | Admitting: Family Medicine

## 2023-11-18 LAB — RPR: RPR Ser Ql: NONREACTIVE

## 2023-11-18 NOTE — Progress Notes (Signed)
 Pt continues to complain of severe lower back pain. Epidural site looks and feels unremarkable. 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 5 YRS sober
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
## 2023-11-18 NOTE — Patient Instructions (Signed)
 If interested in an outpatient lactation consult in office or virtually please reach out to us  at [facility] (First Floor) [address], [place] Please call [phone] and press 4 for lactation.    Lactation support groups:  [facility], Tuesdays 10:00 am -12:00 pm at [address] on the second floor in the conference room, lactating parents and lap babies welcome.  [email or web address]  [email or web address]  Delaney Mandril, [facility]

## 2023-11-18 NOTE — Anesthesia Postprocedure Evaluation (Signed)
 Anesthesia Post Note  Patient: Stacey Cook  Procedure(s) Performed: AN AD HOC LABOR EPIDURAL     Patient location during evaluation: Mother Baby Anesthesia Type: Epidural Level of consciousness: awake Pain management: satisfactory to patient Vital Signs Assessment: post-procedure vital signs reviewed and stable Respiratory status: spontaneous breathing Cardiovascular status: stable Anesthetic complications: no   No notable events documented.  Last Vitals:  Vitals:   11/18/23 0032 11/18/23 0430  BP: (!) 112/52 (!) 109/49  Pulse: 89 66  Resp: 16 16  Temp: 36.7 C 36.7 C  SpO2: 96% 97%    Last Pain:  Vitals:   11/18/23 0742  TempSrc:   PainSc: 8    Pain Goal:                   [name]

## 2023-11-18 NOTE — Progress Notes (Signed)
 Post Partum Day 1 Subjective: no complaints, up ad lib, voiding and tolerating PO, small lochia, plans to bottle feed, unsure, will decide outpt  Objective: Blood pressure (!) 109/49, pulse 66, temperature 98 F (36.7 C), temperature source Oral, resp. rate 16, height 4' 11 (1.499 m), weight 90.4 kg, last menstrual period 02/14/2023, SpO2 97%, unknown if currently breastfeeding.  Physical Exam:  General: alert, cooperative and no distress Lochia:normal flow Chest: CTAB Heart: RRR no m/r/g Abdomen: +BS, soft, nontender,  Uterine Fundus: firm DVT Evaluation: No evidence of DVT seen on physical exam. Extremities: no edema  Recent Labs    11/17/23 1238  HGB 11.9*  HCT 35.3*    Assessment/Plan: Plan for discharge tomorrow and Circumcision prior to discharge   LOS: 1 day   Stacey Cook 11/18/2023, 7:40 AM

## 2023-11-19 ENCOUNTER — Encounter (HOSPITAL_COMMUNITY): Payer: Self-pay | Admitting: Family Medicine

## 2023-11-19 MED ORDER — IBUPROFEN 800 MG PO TABS
800.0000 mg | ORAL_TABLET | Freq: Three times a day (TID) | ORAL | 0 refills | Status: AC | PRN
Start: 2023-11-19 — End: ?

## 2023-11-19 MED ORDER — SENNA 8.6 MG PO TABS: 2.0000 | ORAL_TABLET | Freq: Every evening | ORAL | 0 refills | Status: AC | PRN

## 2023-11-19 MED ORDER — ACETAMINOPHEN 500 MG PO TABS: 1000.0000 mg | ORAL_TABLET | Freq: Three times a day (TID) | ORAL | 0 refills | Status: AC | PRN

## 2023-11-23 ENCOUNTER — Encounter: Admitting: Women's Health

## 2023-11-29 ENCOUNTER — Telehealth (HOSPITAL_COMMUNITY): Payer: Self-pay | Admitting: *Deleted

## 2023-11-29 NOTE — Telephone Encounter (Signed)
 11/29/2023  Name: Stacey Cook MRN: [id number] DOB: [birth year]  Reason for Call:  Transition of Care Hospital Discharge Call  Contact Status: Patient Contact Status: Complete Patient requested call back tomorrow to complete EPDS and to discuss postpartum resources.  Language assistant needed:          Follow-Up Questions: Do You Have Any Concerns About Your Health As You Heal From Delivery?: Yes What Concerns Do You Have About Your Health?: Patient asked about postpartum bleeding. Patient stated that she has been passing quarter-size clots. Also reported that bleeding will taper off, and then resume. No other concerns voiced at this time. RN reviewed normal postpartum bleeding and warning signs to report to MD. Patient verbalized understanding. Do You Have Any Concerns About Your Infants Health?: No  Edinburgh Postnatal Depression Scale:  In the Past 7 Days:   Patient requested call back tomorrow. PHQ2-9 Depression Scale:     Discharge Follow-up:    Post-discharge interventions: NA  Signature Allean IVAR Carton, RN, 11/29/23, [phone]

## 2023-11-30 ENCOUNTER — Encounter: Admitting: Women's Health

## 2023-11-30 ENCOUNTER — Telehealth (HOSPITAL_COMMUNITY): Payer: Self-pay | Admitting: *Deleted

## 2023-11-30 NOTE — Telephone Encounter (Signed)
 11/30/2023  Name: CIERRA ROTHGEB MRN: [id number] DOB: Jun 03, 1995  Reason for Call:  Transition of Care Hospital Discharge Call  Contact Status: Patient Contact Status: Complete  Language assistant needed:          Follow-Up Questions: Do You Have Any Concerns About Your Health As You Heal From Delivery?:  (completed yesterday, patient has no further concerns) Do You Have Any Concerns About Your Infants Health?:  (completed yesterday, no concerns today)  Edinburgh Postnatal Depression Scale:  In the Past 7 Days: I have been able to laugh and see the funny side of things.: Not quite so much now I have looked forward with enjoyment to things.: As much as I ever did I have blamed myself unnecessarily when things went wrong.: Yes, some of the time I have been anxious or worried for no good reason.: Yes, very often I have felt scared or panicky for no good reason.: No, not much Things have been getting on top of me.: No, most of the time I have coped quite well I have been so unhappy that I have had difficulty sleeping.: Not at all I have felt sad or miserable.: Not very often I have been so unhappy that I have been crying.: No, never The thought of harming myself has occurred to me.: Never Edinburgh Postnatal Depression Scale Total: 9  PHQ2-9 Depression Scale:     Discharge Follow-up: Edinburgh score requires follow up?: Yes Provider notified of Edinburgh score?: Yes Have you already been referred for a counseling appointment?: Yes Date of appointment::  (next week, had been seeing weekly during pregnancy, scheduled two weeks out after delivery) Any barriers for going to appointment?: no Patient was advised of the following resources:: Support Group, Breastfeeding Support Group  Post-discharge interventions: Reviewed Newborn Safe Sleep Practices Maternal Mental Health Resources provided  Mliss Sieve, RN 11/30/2023 10:08

## 2023-12-01 ENCOUNTER — Telehealth: Payer: Self-pay | Admitting: *Deleted
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
## 2023-12-07 ENCOUNTER — Telehealth: Admitting: Obstetrics and Gynecology

## 2023-12-07 ENCOUNTER — Encounter: Payer: Self-pay | Admitting: *Deleted

## 2023-12-07 DIAGNOSIS — F418 Other specified anxiety disorders: Secondary | ICD-10-CM

## 2023-12-07 DIAGNOSIS — F419 Anxiety disorder, unspecified: Secondary | ICD-10-CM

## 2023-12-07 DIAGNOSIS — F32A Depression, unspecified: Secondary | ICD-10-CM

## 2023-12-07 MED ORDER — FLUOXETINE HCL 20 MG PO CAPS: 20.0000 mg | ORAL_CAPSULE | Freq: Every day | ORAL | 3 refills | Status: AC

## 2023-12-07 NOTE — Progress Notes (Signed)
  I connected with Stacey Cook: MyChart video and verified that I am speaking with the correct person using two identifiers.  Patient is located at home and provider is located at [facility].     I discussed the limitations, risks, security and privacy concerns of performing an evaluation and management service by MyChart video and the availability of in person appointments. I also discussed with the patient that there may be a patient responsible charge related to this service. By engaging in this virtual visit, you consent to the provision of healthcare.  Additionally, you authorize for your insurance to be billed for the services provided during this visit.  The patient expressed understanding and agreed to proceed.          GYNECOLOGY PROGRESS NOTE  History:  28 y.o. [name] presents to [facility] office today for mood check. She reports she is doing better. She is seeing a counselor regularly. She desires to get back on medication, but wants to try something other than Lexapro . She denies SI/HI, reports she has adequate support at home. She reports hx of bipolar disorder on several medications in the past   Health Maintenance Due  Topic Date Due   Pneumococcal Vaccine 54-54 Years old (1 of 1 - PPSV23, PCV20, or PCV21) 02/20/2017   COVID-19 Vaccine (1 - 2024-25 season) Never done     Review of Systems:  Pertinent items are noted in HPI.   Objective:  Physical Exam Last menstrual period 02/14/2023, unknown if currently breastfeeding. VS reviewed, nursing note reviewed,  Constitutional: no distress Neuro: alert and oriented  Psych: affect normal  Assessment & Plan:  1. Depression with anxiety (Primary) Discussed options for tx, will trial prozac . Discussed expected side effects, start low and can titrate if needed Does not desire referral pychiatry, was previously on multiple meds for BPD  Discussed follow up with any SI/HI symptoms   - FLUoxetine  (PROZAC ) 20 MG  capsule; Take 1 capsule (20 mg total) by mouth daily.  Dispense: 60 capsule; Refill: 3   Return in two weeks for pp visit   Stacey Daring, FNP

## 2023-12-22 ENCOUNTER — Ambulatory Visit: Admitting: Women's Health

## 2023-12-22 ENCOUNTER — Encounter: Payer: Self-pay | Admitting: Women's Health

## 2023-12-22 DIAGNOSIS — Z3202 Encounter for pregnancy test, result negative: Secondary | ICD-10-CM | POA: Diagnosis not present

## 2023-12-22 DIAGNOSIS — Z30430 Encounter for insertion of intrauterine contraceptive device: Secondary | ICD-10-CM | POA: Diagnosis not present

## 2023-12-22 LAB — POCT URINE PREGNANCY: Preg Test, Ur: NEGATIVE

## 2023-12-22 MED ORDER — LEVONORGESTREL 20 MCG/DAY IU IUD
1.0000 | INTRAUTERINE_SYSTEM | Freq: Once | INTRAUTERINE | Status: AC
  Administered 2023-12-22: 1 via INTRAUTERINE

## 2023-12-22 NOTE — Addendum Note (Signed)
 Addended by: SANNA GONG A on: 12/22/2023 11:42 AM   Modules accepted: Orders

## 2023-12-22 NOTE — Patient Instructions (Signed)
 Nothing in vagina for 3 days (no sex, douching, tampons, etc...) Check your strings once a month to make sure you can feel them, if you are not able to please let us  know If you develop a fever of 100.4 or more in the next few weeks, or if you develop severe abdominal pain, please let us  know Use a backup method of birth control, such as condoms, for 2 weeks  Levonorgestrel  Intrauterine Device (IUD) What is this medication? LEVONORGESTREL  (LEE voe nor jes trel) prevents ovulation and pregnancy. It may also be used to treat heavy periods. It belongs to a group of medications called contraceptives. This medication is a progestin hormone. This medicine may be used for other purposes; ask your health care provider or pharmacist if you have questions. COMMON BRAND NAME(S): Kyleena , LILETTA , Mirena , Skyla  What should I tell my care team before I take this medication? They need to know if you have any of these conditions: Abnormal Pap smear Cancer of the breast, uterus, or cervix Diabetes Endometritis Genital or pelvic infection now or in the past Have more than one sexual partner or your partner has more than one partner Heart disease History of an ectopic or tubal pregnancy Immune system problems IUD in place Liver disease or tumor Problems with blood clots or take blood-thinners Seizures Use intravenous drugs Uterus of unusual shape Vaginal bleeding that has not been explained An unusual or allergic reaction to levonorgestrel , other hormones, silicone, or polyethylene, medications, foods, dyes, or preservatives Pregnant or trying to get pregnant Breast-feeding How should I use this medication? This device is placed inside the uterus by your care team. A patient package insert for the product will be given each time it is inserted. Be sure to read this information carefully each time. The sheet may change often. Talk to your care team about use of this medication in children. Special care  may be needed. Overdosage: If you think you have taken too much of this medicine contact a poison control center or emergency room at once. NOTE: This medicine is only for you. Do not share this medicine with others. What if I miss a dose? This does not apply. Depending on the brand of device you have inserted, the device will need to be replaced every 3 to 8 years if you wish to continue using this type of birth control. What may interact with this medication? Interactions are not expected. Tell your care team about all the medications you take. This list may not describe all possible interactions. Give your health care provider a list of all the medicines, herbs, non-prescription drugs, or dietary supplements you use. Also tell them if you smoke, drink alcohol, or use illegal drugs. Some items may interact with your medicine. What should I watch for while using this medication? Visit your care team for regular check-ups. Tell your care team if you or your partner becomes HIV positive or gets a sexually transmitted disease. Using this medication does not protect you or your partner against HIV or other sexually transmitted infections (STIs). You can check the placement of the IUD yourself by reaching up to the top of your vagina with clean fingers to feel the threads. Do not pull on the threads. It is a good habit to check placement after each menstrual period. Call your care team right away if you feel more of the IUD than just the threads or if you cannot feel the threads at all. The IUD may come out by itself.  You may become pregnant if the device comes out. If you notice that the IUD has come out use a backup birth control method like condoms and call your care team. Using tampons will not change the position of the IUD and are okay to use during your period. This IUD can be safely scanned with magnetic resonance imaging (MRI) only under specific conditions. Before you have an MRI, tell your care  team that you have an IUD in place, and which type of IUD you have in place. What side effects may I notice from receiving this medication? Side effects that you should report to your care team as soon as possible: Allergic reactions--skin rash, itching, hives, swelling of the face, lips, tongue, or throat Blood clot--pain, swelling, or warmth in the leg, shortness of breath, chest pain Gallbladder problems--severe stomach pain, nausea, vomiting, fever Increase in blood pressure Liver injury--right upper belly pain, loss of appetite, nausea, light-colored stool, dark yellow or brown urine, yellowing skin or eyes, unusual weakness or fatigue New or worsening migraines or headaches Pelvic inflammatory disease (PID)--fever, abdominal pain, pelvic pain, pain or trouble passing urine, spotting, bleeding during or after sex Stroke--sudden numbness or weakness of the face, arm, or leg, trouble speaking, confusion, trouble walking, loss of balance or coordination, dizziness, severe headache, change in vision Unusual vaginal discharge, itching, or odor Vaginal pain, irritation, or sores Worsening mood, feelings of depression Side effects that usually do not require medical attention (report to your care team if they continue or are bothersome): Breast pain or tenderness Dark patches of skin on the face or other sun-exposed areas Irregular menstrual cycles or spotting Nausea Weight gain This list may not describe all possible side effects. Call your doctor for medical advice about side effects. You may report side effects to FDA at 1-800-FDA-1088. Where should I keep my medication? This does not apply. NOTE: This sheet is a summary. It may not cover all possible information. If you have questions about this medicine, talk to your doctor, pharmacist, or health care provider.  2024 Elsevier/Gold Standard (2021-01-07 00:00:00)

## 2023-12-22 NOTE — Progress Notes (Signed)
 POSTPARTUM VISIT Patient name: Stacey Cook MRN [id number]  Date of birth: 1995/06/23 Chief Complaint:   Postpartum Care (Want iud)  History of Present Illness:   Stacey Cook is a 28 y.o. [phone] Caucasian female being seen today for a postpartum visit. She is 5 weeks postpartum following a vaginal birth after cesarean (VBAC) at 37.0 gestational weeks. IOL: yes, for intrahepatic cholestasis of pregnancy . Anesthesia: epidural.  Laceration: none.  Complications: none. Inpatient contraception: no.   Pregnancy complicated by ICP. Tobacco use: no. Substance use disorder: no. Last pap smear: 11/26/22 and results were NILM w/ HRHPV negative. Next pap smear due: 2027 Patient's last menstrual period was 02/14/2023.  Postpartum course has been uncomplicated. Bleeding none. Bowel function is normal. Bladder function is normal. Urinary incontinence? no, fecal incontinence? no Patient is sexually active. Last sexual activity: 7/28, used a condom, none since. Desired contraception: IUD. Patient does not want a pregnancy in the future.  Desired family size is 4 children.   Upstream - 12/22/23 1034       Pregnancy Intention Screening   Would the patient like to discuss contraceptive options today? No      Contraception Wrap Up   Current Method IUD or IUS    End Method IUD or IUS    Contraception Counseling Provided Yes         The pregnancy intention screening data noted above was reviewed. Potential methods of contraception were discussed. The patient elected to proceed with IUD or IUS.  Edinburgh Postpartum Depression Screening: negative, doing well on fluoxetine   Edinburgh Postnatal Depression Scale - 12/22/23 1036       Edinburgh Postnatal Depression Scale:  In the Past 7 Days   I have been able to laugh and see the funny side of things. 0    I have looked forward with enjoyment to things. 0    I have blamed myself unnecessarily when things went wrong. 2    I have been anxious or  worried for no good reason. 2    I have felt scared or panicky for no good reason. 0    Things have been getting on top of me. 1    I have been so unhappy that I have had difficulty sleeping. 0    I have felt sad or miserable. 0    I have been so unhappy that I have been crying. 1    The thought of harming myself has occurred to me. 0    Edinburgh Postnatal Depression Scale Total 6             12/07/2023    2:39 PM 09/14/2023    9:12 AM 05/26/2023   10:38 AM 11/26/2022   10:12 AM  GAD 7 : Generalized Anxiety Score  Nervous, Anxious, on Edge 1 0 0 2  Control/stop worrying 0 0 0 3  Worry too much - different things 1 1 0 3  Trouble relaxing 0 2 2 3   Restless 1 2 2  0  Easily annoyed or irritable 1 1 3 3   Afraid - awful might happen 2 0 0 0  Total GAD 7 Score 6 6 7 14      Baby's course has been uncomplicated. Baby is feeding by bottle. Infant has a pediatrician/family doctor? Yes.  Childcare strategy if returning to work/school: family.  Pt has material needs met for her and baby: Yes.   Review of Systems:   Pertinent items are noted in HPI Denies Abnormal  vaginal discharge w/ itching/odor/irritation, headaches, visual changes, shortness of breath, chest pain, abdominal pain, severe nausea/vomiting, or problems with urination or bowel movements. Pertinent History Reviewed:  Reviewed past medical,surgical, obstetrical and family history.  Reviewed problem list, medications and allergies. OB History  Gravida Para Term Preterm AB Living  7 4 4  0 3 4  SAB IAB Ectopic Multiple Live Births  3 0 0 0 4    # Outcome Date GA Lbr Len/2nd Weight Sex Type Anes PTL Lv  7 Term 11/17/23 [id number]  6 lb 8.1 oz (2.95 kg) M Vag-Spont EPI  LIV  6 SAB 2022     SAB     5 Term 02/12/20 [redacted]w[redacted]d 04:55 / 00:16 6 lb 2.8 oz (2.8 kg) F VBAC EPI  LIV  4 SAB 05/04/19          3 Term 01/03/19 [redacted]w[redacted]d  8 lb 3 oz (3.714 kg) F CS-LTranv Spinal N LIV  2 Term 10/28/17 [redacted]w[redacted]d 10:29 / 01:39 7 lb 4.8 oz (3.311 kg) M  Vag-Spont EPI  LIV     Complications: Shoulder Dystocia, Gestational diabetes  1 SAB 09/11/16 [redacted]w[redacted]d          Physical Assessment:   Vitals:   12/22/23 1027  BP: 98/68  Pulse: 92  Weight: 186 lb 6.4 oz (84.6 kg)  Height: 4' 11 (1.499 m)  Body mass index is 37.65 kg/m.       Physical Examination:   General appearance: alert, well appearing, and in no distress  Mental status: alert, oriented to person, place, and time  Skin: warm & dry   Cardiovascular: normal heart rate noted   Respiratory: normal respiratory effort, no distress   Breasts: deferred, no complaints   Abdomen: soft, non-tender   Pelvic: VULVA: normal appearing vulva with no masses, tenderness or lesions, VAGINA: normal appearing vagina with normal color and discharge, no lesions, CERVIX: normal appearing cervix without discharge or lesions. Thin prep pap obtained: No  Rectal: not examined  Extremities: Edema: none        Results for orders placed or performed in visit on 12/22/23 (from the past 24 hours)  POCT urine pregnancy   Collection Time: 12/22/23 10:51 AM  Result Value Ref Range   Preg Test, Ur Negative Negative     IUD INSERTION The risks and benefits of the method and placement have been thouroughly reviewed with the patient and all questions were answered.  Specifically the patient is aware of failure rate of 05/998, expulsion of the IUD and of possible perforation.  The patient is aware of irregular bleeding due to the method and understands the incidence of irregular bleeding diminishes with time.  Signed copy of informed consent in chart.   Time out was performed.  A graves speculum was placed in the vagina.  The cervix was visualized, prepped using Betadine , and grasped with a single tooth tenaculum. The uterus was found to be anteroflexed and it sounded to 9 cm.  Mirena   IUD placed per manufacturer's recommendations. The strings were trimmed to approximately 3 cm. The patient tolerated the procedure  well.   Informal transvaginal sonogram was performed and the proper placement of the IUD was verified.  Chaperone: Financial controller & Plan:  1) Postpartum exam 2) 5 wks s/p vaginal birth after cesarean (VBAC) 3) bottle feeding 4) Depression screening 5) Mirena  IUD insertion The patient was given post procedure instructions, including signs and symptoms of infection and to check for the strings after  each menses or each month, and refraining from intercourse or anything in the vagina for 3 days.  Condoms for 2 weeks. She was given a care card with date IUD placed, and date IUD to be removed.  She is scheduled for a f/u appointment in 4 weeks.  Essential components of care per ACOG recommendations:  1.  Mood and well being:  If positive depression screen, discussed and plan developed.  If using tobacco we discussed reduction/cessation and risk of relapse If current substance abuse, we discussed and referral to local resources was offered.   2. Infant care and feeding:  If breastfeeding, discussed returning to work, pumping, breastfeeding-associated pain, guidance regarding return to fertility while lactating if not using another method. If needed, patient was provided with a letter to be allowed to pump q 2-3hrs to support lactation in a private location with access to a refrigerator to store breastmilk.   Recommended that all caregivers be immunized for flu, pertussis and other preventable communicable diseases If pt does not have material needs met for her/baby, referred to local resources for help obtaining these.  3. Sexuality, contraception and birth spacing Provided guidance regarding sexuality, management of dyspareunia, and resumption of intercourse Discussed avoiding interpregnancy interval <29mths and recommended birth spacing of 18 months  4. Sleep and fatigue Discussed coping options for fatigue and sleep disruption Encouraged family/partner/community support of 4 hrs  of uninterrupted sleep to help with mood and fatigue  5. Physical recovery  If pt had a C/S, assessed incisional pain and providing guidance on normal vs prolonged recovery If pt had a laceration, perineal healing and pain reviewed.  If urinary or fecal incontinence, discussed management and referred to PT or uro/gyn if indicated  Patient is safe to resume physical activity. Discussed attainment of healthy weight.  6.  Chronic disease management Discussed pregnancy complications if any, and their implications for future childbearing and long-term maternal health. Review recommendations for prevention of recurrent pregnancy complications, such as 17 hydroxyprogesterone caproate to reduce risk for recurrent PTB not applicable, or aspirin to reduce risk of preeclampsia not applicable. Pt had GDM: No. If yes, 2hr GTT scheduled: not applicable. Reviewed medications and non-pregnant dosing including consideration of whether pt is breastfeeding using a reliable resource such as LactMed: not applicable Referred for f/u w/ PCP or subspecialist providers as indicated: not applicable  7. Health maintenance Mammogram at 26yo or earlier if indicated Pap smears as indicated  Meds: No orders of the defined types were placed in this encounter.   Follow-up: Return in about 4 weeks (around 01/19/2024) for IUD f/u, CNM, in person.   Orders Placed This Encounter  Procedures   POCT urine pregnancy    Suzen JONELLE Fetters CNM, [facility] 12/22/2023 11:06 AM

## 2024-01-19 ENCOUNTER — Ambulatory Visit: Admitting: Women's Health

## 2024-02-18 ENCOUNTER — Emergency Department (HOSPITAL_COMMUNITY)
Admission: EM | Admit: 2024-02-18 | Discharge: 2024-02-18 | Disposition: A | Discharge status: Home / Self Care | Attending: Emergency Medicine | Admitting: Emergency Medicine

## 2024-02-18 ENCOUNTER — Other Ambulatory Visit: Payer: Self-pay

## 2024-02-18 ENCOUNTER — Encounter (HOSPITAL_COMMUNITY): Payer: Self-pay | Admitting: Emergency Medicine

## 2024-02-18 DIAGNOSIS — F419 Anxiety disorder, unspecified: Secondary | ICD-10-CM | POA: Diagnosis present

## 2024-02-18 DIAGNOSIS — F410 Panic disorder [episodic paroxysmal anxiety] without agoraphobia: Secondary | ICD-10-CM | POA: Insufficient documentation

## 2024-02-18 LAB — CBC
HCT: 38.5 % (ref 36.0–46.0)
Hemoglobin: 12.9 g/dL (ref 12.0–15.0)
MCH: 28.7 pg (ref 26.0–34.0)
MCHC: 33.5 g/dL (ref 30.0–36.0)
MCV: 85.7 fL (ref 80.0–100.0)
Platelets: 226 K/uL (ref 150–400)
RBC: 4.49 MIL/uL (ref 3.87–5.11)
RDW: 13.2 % (ref 11.5–15.5)
WBC: 10.3 K/uL (ref 4.0–10.5)
nRBC: 0 % (ref 0.0–0.2)

## 2024-02-18 LAB — BASIC METABOLIC PANEL WITH GFR
Anion gap: 16 — ABNORMAL HIGH (ref 5–15)
BUN: 16 mg/dL (ref 6–20)
CO2: 22 mmol/L (ref 22–32)
Calcium: 9.9 mg/dL (ref 8.9–10.3)
Chloride: 103 mmol/L (ref 98–111)
Creatinine, Ser: 0.73 mg/dL (ref 0.44–1.00)
GFR, Estimated: >60 mL/min (ref >60)
Glucose, Bld: 111 mg/dL — ABNORMAL HIGH (ref 70–99)
Potassium: 3.6 mmol/L (ref 3.5–5.1)
Sodium: 141 mmol/L (ref 135–145)
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
## 2024-02-18 NOTE — Discharge Instructions (Signed)
 Your testing was normal, ER for worsening symptoms, follow-up with your doctor to manage her anxiety chronically
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
## 2024-02-18 NOTE — ED Notes (Signed)
 See triage notes. Nad. A/o. Color wnl. Non diaphoretic.
